# Patient Record
Sex: Female | Born: 1950 | Race: Black or African American | Hispanic: No | Smoking: Never smoker
Health system: Southern US, Community
[De-identification: ages and names within clinical notes are randomized; demographics above are authoritative.]

## PROBLEM LIST (undated history)

## (undated) DIAGNOSIS — F319 Bipolar disorder, unspecified: Secondary | ICD-10-CM

## (undated) DIAGNOSIS — E119 Type 2 diabetes mellitus without complications: Secondary | ICD-10-CM

## (undated) DIAGNOSIS — F419 Anxiety disorder, unspecified: Secondary | ICD-10-CM

## (undated) DIAGNOSIS — K219 Gastro-esophageal reflux disease without esophagitis: Secondary | ICD-10-CM

## (undated) DIAGNOSIS — I10 Essential (primary) hypertension: Secondary | ICD-10-CM

## (undated) DIAGNOSIS — K635 Polyp of colon: Secondary | ICD-10-CM

## (undated) DIAGNOSIS — G8929 Other chronic pain: Secondary | ICD-10-CM

## (undated) DIAGNOSIS — I480 Paroxysmal atrial fibrillation: Secondary | ICD-10-CM

## (undated) DIAGNOSIS — K5790 Diverticulosis of intestine, part unspecified, without perforation or abscess without bleeding: Secondary | ICD-10-CM

## (undated) DIAGNOSIS — M797 Fibromyalgia: Secondary | ICD-10-CM

## (undated) DIAGNOSIS — Z794 Long term (current) use of insulin: Secondary | ICD-10-CM

## (undated) DIAGNOSIS — F329 Major depressive disorder, single episode, unspecified: Secondary | ICD-10-CM

## (undated) DIAGNOSIS — K8020 Calculus of gallbladder without cholecystitis without obstruction: Secondary | ICD-10-CM

## (undated) DIAGNOSIS — I4819 Other persistent atrial fibrillation: Secondary | ICD-10-CM

## (undated) DIAGNOSIS — IMO0001 Reserved for inherently not codable concepts without codable children: Secondary | ICD-10-CM

## (undated) DIAGNOSIS — N183 Chronic kidney disease, stage 3 unspecified: Secondary | ICD-10-CM

## (undated) DIAGNOSIS — Z8719 Personal history of other diseases of the digestive system: Secondary | ICD-10-CM

## (undated) DIAGNOSIS — E785 Hyperlipidemia, unspecified: Secondary | ICD-10-CM

## (undated) DIAGNOSIS — K589 Irritable bowel syndrome without diarrhea: Secondary | ICD-10-CM

## (undated) DIAGNOSIS — R55 Syncope and collapse: Secondary | ICD-10-CM

## (undated) DIAGNOSIS — D860 Sarcoidosis of lung: Secondary | ICD-10-CM

## (undated) DIAGNOSIS — G40909 Epilepsy, unspecified, not intractable, without status epilepticus: Secondary | ICD-10-CM

## (undated) DIAGNOSIS — F32A Depression, unspecified: Secondary | ICD-10-CM

## (undated) DIAGNOSIS — M549 Dorsalgia, unspecified: Secondary | ICD-10-CM

## (undated) DIAGNOSIS — M1990 Unspecified osteoarthritis, unspecified site: Secondary | ICD-10-CM

## (undated) DIAGNOSIS — G629 Polyneuropathy, unspecified: Secondary | ICD-10-CM

## (undated) DIAGNOSIS — I4891 Unspecified atrial fibrillation: Secondary | ICD-10-CM

## (undated) DIAGNOSIS — R569 Unspecified convulsions: Secondary | ICD-10-CM

## (undated) HISTORY — DX: Irritable bowel syndrome, unspecified: K58.9

## (undated) HISTORY — PX: COLON SURGERY: SHX602

## (undated) HISTORY — DX: Chronic kidney disease, stage 3 (moderate): N18.3

## (undated) HISTORY — DX: Long term (current) use of insulin: Z79.4

## (undated) HISTORY — DX: Unspecified atrial fibrillation: I48.91

## (undated) HISTORY — DX: Major depressive disorder, single episode, unspecified: F32.9

## (undated) HISTORY — DX: Diverticulosis of intestine, part unspecified, without perforation or abscess without bleeding: K57.90

## (undated) HISTORY — DX: Depression, unspecified: F32.A

## (undated) HISTORY — PX: DILATION AND CURETTAGE OF UTERUS: SHX78

## (undated) HISTORY — DX: Polyneuropathy, unspecified: G62.9

## (undated) HISTORY — DX: Type 2 diabetes mellitus without complications: E11.9

## (undated) HISTORY — DX: Other persistent atrial fibrillation: I48.19

## (undated) HISTORY — DX: Reserved for inherently not codable concepts without codable children: IMO0001

## (undated) HISTORY — DX: Fibromyalgia: M79.7

## (undated) HISTORY — DX: Polyp of colon: K63.5

## (undated) HISTORY — DX: Calculus of gallbladder without cholecystitis without obstruction: K80.20

## (undated) HISTORY — DX: Paroxysmal atrial fibrillation: I48.0

## (undated) HISTORY — DX: Hyperlipidemia, unspecified: E78.5

## (undated) HISTORY — PX: FRACTURE SURGERY: SHX138

---

## 1974-08-17 HISTORY — PX: CHOLECYSTECTOMY OPEN: SUR202

## 1980-08-17 HISTORY — PX: TUBAL LIGATION: SHX77

## 1993-08-17 HISTORY — PX: ABDOMINAL HYSTERECTOMY: SHX81

## 1998-03-27 ENCOUNTER — Emergency Department (HOSPITAL_COMMUNITY): Admission: EM | Admit: 1998-03-27 | Discharge: 1998-03-27 | Payer: Self-pay | Admitting: Emergency Medicine

## 1998-08-17 HISTORY — PX: SALPINGOOPHORECTOMY: SHX82

## 1998-09-12 ENCOUNTER — Other Ambulatory Visit: Admission: RE | Admit: 1998-09-12 | Discharge: 1998-09-12 | Payer: Self-pay | Admitting: Internal Medicine

## 1998-11-19 ENCOUNTER — Other Ambulatory Visit: Admission: RE | Admit: 1998-11-19 | Discharge: 1998-11-19 | Payer: Self-pay | Admitting: Internal Medicine

## 1999-04-04 ENCOUNTER — Emergency Department (HOSPITAL_COMMUNITY): Admission: EM | Admit: 1999-04-04 | Discharge: 1999-04-04 | Payer: Self-pay | Admitting: Emergency Medicine

## 1999-04-05 ENCOUNTER — Encounter: Payer: Self-pay | Admitting: Emergency Medicine

## 1999-05-02 ENCOUNTER — Emergency Department (HOSPITAL_COMMUNITY): Admission: EM | Admit: 1999-05-02 | Discharge: 1999-05-02 | Payer: Self-pay | Admitting: Emergency Medicine

## 1999-05-20 ENCOUNTER — Ambulatory Visit (HOSPITAL_COMMUNITY): Admission: RE | Admit: 1999-05-20 | Discharge: 1999-05-20 | Payer: Self-pay | Admitting: Internal Medicine

## 1999-05-20 ENCOUNTER — Encounter: Payer: Self-pay | Admitting: Internal Medicine

## 1999-06-15 ENCOUNTER — Encounter: Payer: Self-pay | Admitting: Family Medicine

## 1999-06-15 ENCOUNTER — Ambulatory Visit (HOSPITAL_COMMUNITY): Admission: RE | Admit: 1999-06-15 | Discharge: 1999-06-15 | Payer: Self-pay | Admitting: Family Medicine

## 1999-07-07 ENCOUNTER — Encounter: Admission: RE | Admit: 1999-07-07 | Discharge: 1999-08-14 | Payer: Self-pay | Admitting: Orthopedic Surgery

## 1999-08-31 ENCOUNTER — Emergency Department (HOSPITAL_COMMUNITY): Admission: EM | Admit: 1999-08-31 | Discharge: 1999-08-31 | Payer: Self-pay | Admitting: Emergency Medicine

## 1999-11-01 ENCOUNTER — Emergency Department (HOSPITAL_COMMUNITY): Admission: EM | Admit: 1999-11-01 | Discharge: 1999-11-01 | Payer: Self-pay | Admitting: Emergency Medicine

## 1999-11-02 ENCOUNTER — Encounter: Payer: Self-pay | Admitting: Emergency Medicine

## 1999-11-25 ENCOUNTER — Encounter: Payer: Self-pay | Admitting: Emergency Medicine

## 1999-11-25 ENCOUNTER — Emergency Department (HOSPITAL_COMMUNITY): Admission: EM | Admit: 1999-11-25 | Discharge: 1999-11-25 | Payer: Self-pay | Admitting: Emergency Medicine

## 2000-01-10 ENCOUNTER — Emergency Department (HOSPITAL_COMMUNITY): Admission: EM | Admit: 2000-01-10 | Discharge: 2000-01-10 | Payer: Self-pay | Admitting: *Deleted

## 2000-01-10 ENCOUNTER — Emergency Department (HOSPITAL_COMMUNITY): Admission: EM | Admit: 2000-01-10 | Discharge: 2000-01-10 | Payer: Self-pay | Admitting: Emergency Medicine

## 2000-03-16 ENCOUNTER — Encounter: Admission: RE | Admit: 2000-03-16 | Discharge: 2000-06-14 | Payer: Self-pay | Admitting: Orthopedic Surgery

## 2000-06-15 ENCOUNTER — Encounter: Admission: RE | Admit: 2000-06-15 | Discharge: 2000-08-12 | Payer: Self-pay | Admitting: Orthopedic Surgery

## 2000-07-24 ENCOUNTER — Ambulatory Visit (HOSPITAL_COMMUNITY): Admission: RE | Admit: 2000-07-24 | Discharge: 2000-07-24 | Payer: Self-pay | Admitting: Orthopedic Surgery

## 2000-07-24 ENCOUNTER — Encounter: Payer: Self-pay | Admitting: Orthopedic Surgery

## 2000-08-17 HISTORY — PX: NEUROPLASTY / TRANSPOSITION MEDIAN NERVE AT CARPAL TUNNEL: SUR893

## 2000-11-17 ENCOUNTER — Ambulatory Visit (HOSPITAL_COMMUNITY)
Admission: RE | Admit: 2000-11-17 | Discharge: 2000-11-17 | Payer: Self-pay | Admitting: Physical Medicine and Rehabilitation

## 2000-11-17 ENCOUNTER — Encounter: Payer: Self-pay | Admitting: Physical Medicine and Rehabilitation

## 2000-12-22 ENCOUNTER — Ambulatory Visit (HOSPITAL_BASED_OUTPATIENT_CLINIC_OR_DEPARTMENT_OTHER): Admission: RE | Admit: 2000-12-22 | Discharge: 2000-12-22 | Payer: Self-pay | Admitting: Orthopedic Surgery

## 2001-07-26 ENCOUNTER — Encounter: Payer: Self-pay | Admitting: Family Medicine

## 2001-07-26 ENCOUNTER — Ambulatory Visit (HOSPITAL_COMMUNITY): Admission: RE | Admit: 2001-07-26 | Discharge: 2001-07-26 | Payer: Self-pay | Admitting: Family Medicine

## 2001-08-14 ENCOUNTER — Emergency Department (HOSPITAL_COMMUNITY): Admission: EM | Admit: 2001-08-14 | Discharge: 2001-08-14 | Payer: Self-pay | Admitting: Emergency Medicine

## 2001-10-11 ENCOUNTER — Ambulatory Visit (HOSPITAL_BASED_OUTPATIENT_CLINIC_OR_DEPARTMENT_OTHER): Admission: RE | Admit: 2001-10-11 | Discharge: 2001-10-11 | Payer: Self-pay | Admitting: Orthopedic Surgery

## 2002-01-15 HISTORY — PX: RESECTION OF HAND NEUROMA: SHX6071

## 2002-01-19 ENCOUNTER — Ambulatory Visit (HOSPITAL_BASED_OUTPATIENT_CLINIC_OR_DEPARTMENT_OTHER): Admission: RE | Admit: 2002-01-19 | Discharge: 2002-01-19 | Payer: Self-pay | Admitting: Orthopedic Surgery

## 2002-02-23 ENCOUNTER — Encounter: Payer: Self-pay | Admitting: Family Medicine

## 2002-02-23 ENCOUNTER — Ambulatory Visit (HOSPITAL_COMMUNITY): Admission: RE | Admit: 2002-02-23 | Discharge: 2002-02-23 | Payer: Self-pay | Admitting: Family Medicine

## 2002-03-06 ENCOUNTER — Encounter: Payer: Self-pay | Admitting: Family Medicine

## 2002-03-06 ENCOUNTER — Ambulatory Visit (HOSPITAL_COMMUNITY): Admission: RE | Admit: 2002-03-06 | Discharge: 2002-03-06 | Payer: Self-pay | Admitting: Family Medicine

## 2002-03-16 ENCOUNTER — Encounter: Admission: RE | Admit: 2002-03-16 | Discharge: 2002-03-16 | Payer: Self-pay | Admitting: Obstetrics and Gynecology

## 2002-04-11 ENCOUNTER — Encounter (INDEPENDENT_AMBULATORY_CARE_PROVIDER_SITE_OTHER): Payer: Self-pay | Admitting: Specialist

## 2002-04-11 ENCOUNTER — Inpatient Hospital Stay (HOSPITAL_COMMUNITY): Admission: RE | Admit: 2002-04-11 | Discharge: 2002-04-14 | Payer: Self-pay | Admitting: Obstetrics and Gynecology

## 2002-04-11 ENCOUNTER — Encounter (INDEPENDENT_AMBULATORY_CARE_PROVIDER_SITE_OTHER): Payer: Self-pay

## 2002-04-18 ENCOUNTER — Encounter: Admission: RE | Admit: 2002-04-18 | Discharge: 2002-04-18 | Payer: Self-pay | Admitting: *Deleted

## 2002-05-02 ENCOUNTER — Encounter: Admission: RE | Admit: 2002-05-02 | Discharge: 2002-05-02 | Payer: Self-pay | Admitting: *Deleted

## 2002-05-16 ENCOUNTER — Encounter: Admission: RE | Admit: 2002-05-16 | Discharge: 2002-05-16 | Payer: Self-pay | Admitting: *Deleted

## 2002-05-22 ENCOUNTER — Encounter: Payer: Self-pay | Admitting: Obstetrics and Gynecology

## 2002-05-22 ENCOUNTER — Inpatient Hospital Stay (HOSPITAL_COMMUNITY): Admission: AD | Admit: 2002-05-22 | Discharge: 2002-05-22 | Payer: Self-pay | Admitting: Obstetrics and Gynecology

## 2002-05-30 ENCOUNTER — Encounter: Admission: RE | Admit: 2002-05-30 | Discharge: 2002-05-30 | Payer: Self-pay | Admitting: *Deleted

## 2002-06-07 ENCOUNTER — Ambulatory Visit (HOSPITAL_COMMUNITY): Admission: RE | Admit: 2002-06-07 | Discharge: 2002-06-07 | Payer: Self-pay | Admitting: *Deleted

## 2002-07-14 ENCOUNTER — Encounter: Payer: Self-pay | Admitting: Emergency Medicine

## 2002-07-14 ENCOUNTER — Emergency Department (HOSPITAL_COMMUNITY): Admission: EM | Admit: 2002-07-14 | Discharge: 2002-07-14 | Payer: Self-pay | Admitting: Emergency Medicine

## 2002-08-17 HISTORY — PX: NEUROPLASTY / TRANSPOSITION MEDIAN NERVE AT CARPAL TUNNEL: SUR893

## 2002-08-22 ENCOUNTER — Ambulatory Visit (HOSPITAL_COMMUNITY): Admission: RE | Admit: 2002-08-22 | Discharge: 2002-08-22 | Payer: Self-pay | Admitting: Internal Medicine

## 2002-11-01 ENCOUNTER — Ambulatory Visit (HOSPITAL_COMMUNITY): Admission: RE | Admit: 2002-11-01 | Discharge: 2002-11-01 | Payer: Self-pay | Admitting: Family Medicine

## 2002-11-01 ENCOUNTER — Encounter: Payer: Self-pay | Admitting: Family Medicine

## 2003-01-11 ENCOUNTER — Ambulatory Visit (HOSPITAL_COMMUNITY): Admission: RE | Admit: 2003-01-11 | Discharge: 2003-01-11 | Payer: Self-pay | Admitting: Family Medicine

## 2003-01-31 ENCOUNTER — Encounter: Payer: Self-pay | Admitting: Family Medicine

## 2003-01-31 ENCOUNTER — Ambulatory Visit (HOSPITAL_COMMUNITY): Admission: RE | Admit: 2003-01-31 | Discharge: 2003-01-31 | Payer: Self-pay | Admitting: Family Medicine

## 2003-02-27 ENCOUNTER — Encounter: Admission: RE | Admit: 2003-02-27 | Discharge: 2003-05-28 | Payer: Self-pay | Admitting: Family Medicine

## 2003-03-15 ENCOUNTER — Encounter: Admission: RE | Admit: 2003-03-15 | Discharge: 2003-03-15 | Payer: Self-pay | Admitting: Obstetrics and Gynecology

## 2003-04-03 ENCOUNTER — Encounter: Payer: Self-pay | Admitting: Emergency Medicine

## 2003-04-03 ENCOUNTER — Emergency Department (HOSPITAL_COMMUNITY): Admission: EM | Admit: 2003-04-03 | Discharge: 2003-04-03 | Payer: Self-pay | Admitting: Emergency Medicine

## 2003-04-12 ENCOUNTER — Ambulatory Visit (HOSPITAL_COMMUNITY): Admission: RE | Admit: 2003-04-12 | Discharge: 2003-04-12 | Payer: Self-pay | Admitting: Gastroenterology

## 2003-04-12 ENCOUNTER — Encounter: Payer: Self-pay | Admitting: Gastroenterology

## 2003-05-29 ENCOUNTER — Encounter: Admission: RE | Admit: 2003-05-29 | Discharge: 2003-07-31 | Payer: Self-pay | Admitting: Family Medicine

## 2003-06-11 ENCOUNTER — Ambulatory Visit (HOSPITAL_COMMUNITY): Admission: RE | Admit: 2003-06-11 | Discharge: 2003-06-11 | Payer: Self-pay | Admitting: Family Medicine

## 2003-06-13 ENCOUNTER — Encounter: Admission: RE | Admit: 2003-06-13 | Discharge: 2003-06-13 | Payer: Self-pay | Admitting: Family Medicine

## 2004-03-06 ENCOUNTER — Emergency Department (HOSPITAL_COMMUNITY): Admission: EM | Admit: 2004-03-06 | Discharge: 2004-03-06 | Payer: Self-pay | Admitting: Emergency Medicine

## 2004-06-27 ENCOUNTER — Ambulatory Visit (HOSPITAL_COMMUNITY): Admission: RE | Admit: 2004-06-27 | Discharge: 2004-06-27 | Payer: Self-pay | Admitting: Family Medicine

## 2004-07-08 ENCOUNTER — Encounter: Admission: RE | Admit: 2004-07-08 | Discharge: 2004-07-08 | Payer: Self-pay | Admitting: Family Medicine

## 2004-07-10 ENCOUNTER — Emergency Department (HOSPITAL_COMMUNITY): Admission: EM | Admit: 2004-07-10 | Discharge: 2004-07-10 | Payer: Self-pay | Admitting: Emergency Medicine

## 2004-07-12 ENCOUNTER — Emergency Department (HOSPITAL_COMMUNITY): Admission: EM | Admit: 2004-07-12 | Discharge: 2004-07-12 | Payer: Self-pay | Admitting: Emergency Medicine

## 2004-10-27 ENCOUNTER — Emergency Department (HOSPITAL_COMMUNITY): Admission: EM | Admit: 2004-10-27 | Discharge: 2004-10-27 | Payer: Self-pay | Admitting: Emergency Medicine

## 2004-11-04 ENCOUNTER — Emergency Department (HOSPITAL_COMMUNITY): Admission: EM | Admit: 2004-11-04 | Discharge: 2004-11-04 | Payer: Self-pay | Admitting: Emergency Medicine

## 2005-01-13 ENCOUNTER — Emergency Department (HOSPITAL_COMMUNITY): Admission: EM | Admit: 2005-01-13 | Discharge: 2005-01-13 | Payer: Self-pay | Admitting: Emergency Medicine

## 2006-05-19 ENCOUNTER — Ambulatory Visit (HOSPITAL_COMMUNITY): Admission: RE | Admit: 2006-05-19 | Discharge: 2006-05-19 | Payer: Self-pay | Admitting: Obstetrics and Gynecology

## 2006-05-28 ENCOUNTER — Ambulatory Visit: Payer: Self-pay | Admitting: Gastroenterology

## 2006-06-09 ENCOUNTER — Ambulatory Visit: Payer: Self-pay | Admitting: Obstetrics and Gynecology

## 2006-06-10 ENCOUNTER — Ambulatory Visit: Payer: Self-pay | Admitting: Gastroenterology

## 2006-10-16 HISTORY — PX: CLOSED REDUCTION ANKLE FRACTURE: SUR210

## 2006-10-20 ENCOUNTER — Emergency Department (HOSPITAL_COMMUNITY): Admission: EM | Admit: 2006-10-20 | Discharge: 2006-10-21 | Payer: Self-pay | Admitting: Emergency Medicine

## 2006-10-21 ENCOUNTER — Emergency Department (HOSPITAL_COMMUNITY): Admission: EM | Admit: 2006-10-21 | Discharge: 2006-10-21 | Payer: Self-pay | Admitting: Emergency Medicine

## 2006-10-27 ENCOUNTER — Ambulatory Visit (HOSPITAL_BASED_OUTPATIENT_CLINIC_OR_DEPARTMENT_OTHER): Admission: RE | Admit: 2006-10-27 | Discharge: 2006-10-28 | Payer: Self-pay | Admitting: Orthopedic Surgery

## 2007-05-23 ENCOUNTER — Ambulatory Visit (HOSPITAL_COMMUNITY): Admission: RE | Admit: 2007-05-23 | Discharge: 2007-05-23 | Payer: Self-pay | Admitting: Obstetrics and Gynecology

## 2007-09-26 ENCOUNTER — Emergency Department (HOSPITAL_COMMUNITY): Admission: EM | Admit: 2007-09-26 | Discharge: 2007-09-26 | Payer: Self-pay | Admitting: Emergency Medicine

## 2007-09-28 ENCOUNTER — Observation Stay (HOSPITAL_COMMUNITY): Admission: EM | Admit: 2007-09-28 | Discharge: 2007-09-29 | Payer: Self-pay | Admitting: Emergency Medicine

## 2007-09-29 ENCOUNTER — Encounter (INDEPENDENT_AMBULATORY_CARE_PROVIDER_SITE_OTHER): Payer: Self-pay | Admitting: Internal Medicine

## 2007-12-07 ENCOUNTER — Ambulatory Visit: Payer: Self-pay | Admitting: Family Medicine

## 2007-12-08 ENCOUNTER — Encounter: Admission: RE | Admit: 2007-12-08 | Discharge: 2008-03-07 | Payer: Self-pay | Admitting: Internal Medicine

## 2007-12-09 ENCOUNTER — Ambulatory Visit: Payer: Self-pay | Admitting: *Deleted

## 2008-01-31 ENCOUNTER — Ambulatory Visit: Payer: Self-pay | Admitting: Internal Medicine

## 2008-03-01 ENCOUNTER — Encounter: Payer: Self-pay | Admitting: Family Medicine

## 2008-03-01 ENCOUNTER — Ambulatory Visit: Payer: Self-pay | Admitting: Internal Medicine

## 2008-03-01 ENCOUNTER — Emergency Department (HOSPITAL_COMMUNITY): Admission: EM | Admit: 2008-03-01 | Discharge: 2008-03-01 | Payer: Self-pay | Admitting: Emergency Medicine

## 2008-03-01 LAB — CONVERTED CEMR LAB
Alkaline Phosphatase: 77 units/L (ref 39–117)
BUN: 19 mg/dL (ref 6–23)
CO2: 21 meq/L (ref 19–32)
Cholesterol: 210 mg/dL — ABNORMAL HIGH (ref 0–200)
Creatinine, Ser: 1.24 mg/dL — ABNORMAL HIGH (ref 0.40–1.20)
Eosinophils Absolute: 0.1 10*3/uL (ref 0.0–0.7)
Eosinophils Relative: 2 % (ref 0–5)
Glucose, Bld: 131 mg/dL — ABNORMAL HIGH (ref 70–99)
HCT: 40.3 % (ref 36.0–46.0)
HDL: 44 mg/dL (ref >39)
Hemoglobin: 12.9 g/dL (ref 12.0–15.0)
LDL Cholesterol: 93 mg/dL (ref 0–99)
Lymphs Abs: 1.5 10*3/uL (ref 0.7–4.0)
MCV: 89.8 fL (ref 78.0–100.0)
Monocytes Absolute: 0.3 10*3/uL (ref 0.1–1.0)
Platelets: 199 10*3/uL (ref 150–400)
Sodium: 138 meq/L (ref 135–145)
Total Bilirubin: 0.5 mg/dL (ref 0.3–1.2)
Total CHOL/HDL Ratio: 4.8
Total Protein: 8.6 g/dL — ABNORMAL HIGH (ref 6.0–8.3)
Triglycerides: 366 mg/dL — ABNORMAL HIGH (ref <150)
VLDL: 73 mg/dL — ABNORMAL HIGH (ref 0–40)
WBC: 4 10*3/uL (ref 4.0–10.5)

## 2008-03-07 ENCOUNTER — Ambulatory Visit: Payer: Self-pay | Admitting: Internal Medicine

## 2008-03-08 ENCOUNTER — Encounter: Admission: RE | Admit: 2008-03-08 | Discharge: 2008-05-08 | Payer: Self-pay | Admitting: Internal Medicine

## 2008-04-18 ENCOUNTER — Ambulatory Visit: Payer: Self-pay | Admitting: Internal Medicine

## 2008-04-18 ENCOUNTER — Encounter: Payer: Self-pay | Admitting: Family Medicine

## 2008-04-18 LAB — CONVERTED CEMR LAB
Free T4: 1.26 ng/dL (ref 0.89–1.80)
TSH: 1.084 microintl units/mL (ref 0.350–4.50)
Total CHOL/HDL Ratio: 4

## 2008-05-03 ENCOUNTER — Ambulatory Visit: Payer: Self-pay | Admitting: Internal Medicine

## 2008-05-04 ENCOUNTER — Ambulatory Visit: Payer: Self-pay | Admitting: Family Medicine

## 2008-05-29 ENCOUNTER — Ambulatory Visit (HOSPITAL_COMMUNITY): Admission: RE | Admit: 2008-05-29 | Discharge: 2008-05-29 | Payer: Self-pay | Admitting: Family Medicine

## 2008-05-30 ENCOUNTER — Encounter: Admission: RE | Admit: 2008-05-30 | Discharge: 2008-05-30 | Payer: Self-pay | Admitting: Internal Medicine

## 2008-05-31 ENCOUNTER — Ambulatory Visit: Payer: Self-pay | Admitting: Internal Medicine

## 2008-05-31 ENCOUNTER — Encounter: Payer: Self-pay | Admitting: Family Medicine

## 2008-05-31 LAB — CONVERTED CEMR LAB
ALT: 15 units/L (ref 0–35)
Alkaline Phosphatase: 59 units/L (ref 39–117)
CO2: 24 meq/L (ref 19–32)
Creatinine, Ser: 1.18 mg/dL (ref 0.40–1.20)
Sodium: 139 meq/L (ref 135–145)
Total Bilirubin: 0.2 mg/dL — ABNORMAL LOW (ref 0.3–1.2)
Total CK: 227 units/L — ABNORMAL HIGH (ref 7–177)
Total Protein: 7.7 g/dL (ref 6.0–8.3)

## 2008-06-21 ENCOUNTER — Ambulatory Visit: Payer: Self-pay | Admitting: Internal Medicine

## 2008-06-21 ENCOUNTER — Encounter: Payer: Self-pay | Admitting: Family Medicine

## 2008-06-21 LAB — CONVERTED CEMR LAB
Alkaline Phosphatase: 62 units/L (ref 39–117)
Basophils Absolute: 0 10*3/uL (ref 0.0–0.1)
CO2: 24 meq/L (ref 19–32)
Cholesterol: 211 mg/dL — ABNORMAL HIGH (ref 0–200)
Creatinine, Ser: 1.38 mg/dL — ABNORMAL HIGH (ref 0.40–1.20)
Eosinophils Absolute: 0 10*3/uL (ref 0.0–0.7)
Eosinophils Relative: 1 % (ref 0–5)
Glucose, Bld: 150 mg/dL — ABNORMAL HIGH (ref 70–99)
HCT: 37.3 % (ref 36.0–46.0)
HDL: 47 mg/dL (ref >39)
Hemoglobin: 12 g/dL (ref 12.0–15.0)
LDL Cholesterol: 87 mg/dL (ref 0–99)
Lymphocytes Relative: 46 % (ref 12–46)
Lymphs Abs: 1.5 10*3/uL (ref 0.7–4.0)
MCV: 89.2 fL (ref 78.0–100.0)
Monocytes Absolute: 0.2 10*3/uL (ref 0.1–1.0)
Platelets: 184 10*3/uL (ref 150–400)
RDW: 14.1 % (ref 11.5–15.5)
Total Bilirubin: 0.6 mg/dL (ref 0.3–1.2)
Total CHOL/HDL Ratio: 4.5
Triglycerides: 387 mg/dL — ABNORMAL HIGH (ref <150)
VLDL: 77 mg/dL — ABNORMAL HIGH (ref 0–40)

## 2008-07-09 ENCOUNTER — Emergency Department (HOSPITAL_COMMUNITY): Admission: EM | Admit: 2008-07-09 | Discharge: 2008-07-09 | Payer: Self-pay | Admitting: Emergency Medicine

## 2008-07-10 ENCOUNTER — Ambulatory Visit: Payer: Self-pay | Admitting: Family Medicine

## 2008-07-25 ENCOUNTER — Ambulatory Visit: Payer: Self-pay | Admitting: Family Medicine

## 2008-07-25 ENCOUNTER — Encounter: Payer: Self-pay | Admitting: Family Medicine

## 2008-08-29 ENCOUNTER — Encounter: Admission: RE | Admit: 2008-08-29 | Discharge: 2008-08-29 | Payer: Self-pay | Admitting: Internal Medicine

## 2008-10-09 ENCOUNTER — Ambulatory Visit: Payer: Self-pay | Admitting: Internal Medicine

## 2008-10-09 ENCOUNTER — Encounter: Payer: Self-pay | Admitting: Family Medicine

## 2008-10-09 LAB — CONVERTED CEMR LAB
ALT: 19 units/L (ref 0–35)
AST: 25 units/L (ref 0–37)
Alkaline Phosphatase: 64 units/L (ref 39–117)
BUN: 20 mg/dL (ref 6–23)
Calcium: 10 mg/dL (ref 8.4–10.5)
Creatinine, Ser: 1.38 mg/dL — ABNORMAL HIGH (ref 0.40–1.20)
HDL: 57 mg/dL (ref >39)
LDL Cholesterol: 162 mg/dL — ABNORMAL HIGH (ref 0–99)
Total Bilirubin: 0.4 mg/dL (ref 0.3–1.2)
Total CHOL/HDL Ratio: 4.6
VLDL: 42 mg/dL — ABNORMAL HIGH (ref 0–40)

## 2008-10-24 ENCOUNTER — Ambulatory Visit: Payer: Self-pay | Admitting: Family Medicine

## 2008-10-24 LAB — CONVERTED CEMR LAB: Microalb, Ur: 0.81 mg/dL (ref 0.00–1.89)

## 2008-11-19 ENCOUNTER — Encounter: Payer: Self-pay | Admitting: Internal Medicine

## 2008-11-19 ENCOUNTER — Ambulatory Visit: Payer: Self-pay | Admitting: Internal Medicine

## 2008-11-19 DIAGNOSIS — E118 Type 2 diabetes mellitus with unspecified complications: Secondary | ICD-10-CM

## 2008-11-19 DIAGNOSIS — E1165 Type 2 diabetes mellitus with hyperglycemia: Secondary | ICD-10-CM

## 2008-11-19 DIAGNOSIS — R05 Cough: Secondary | ICD-10-CM

## 2008-11-19 DIAGNOSIS — I10 Essential (primary) hypertension: Secondary | ICD-10-CM

## 2008-11-19 DIAGNOSIS — E785 Hyperlipidemia, unspecified: Secondary | ICD-10-CM

## 2008-12-14 ENCOUNTER — Ambulatory Visit: Payer: Self-pay | Admitting: Internal Medicine

## 2009-02-05 ENCOUNTER — Ambulatory Visit: Payer: Self-pay | Admitting: Family Medicine

## 2009-02-24 ENCOUNTER — Emergency Department (HOSPITAL_COMMUNITY): Admission: EM | Admit: 2009-02-24 | Discharge: 2009-02-24 | Payer: Self-pay | Admitting: Emergency Medicine

## 2009-03-06 ENCOUNTER — Ambulatory Visit: Payer: Self-pay | Admitting: Internal Medicine

## 2009-03-15 ENCOUNTER — Ambulatory Visit: Payer: Self-pay | Admitting: Internal Medicine

## 2009-05-08 ENCOUNTER — Ambulatory Visit: Payer: Self-pay | Admitting: Internal Medicine

## 2009-05-08 ENCOUNTER — Encounter: Payer: Self-pay | Admitting: Family Medicine

## 2009-05-08 LAB — CONVERTED CEMR LAB
Albumin: 4.7 g/dL (ref 3.5–5.2)
Alkaline Phosphatase: 65 units/L (ref 39–117)
BUN: 15 mg/dL (ref 6–23)
Creatinine, Ser: 1.3 mg/dL — ABNORMAL HIGH (ref 0.40–1.20)
Glucose, Bld: 124 mg/dL — ABNORMAL HIGH (ref 70–99)
HDL: 48 mg/dL (ref >39)
LDL Cholesterol: 48 mg/dL (ref 0–99)
Potassium: 3.8 meq/L (ref 3.5–5.3)
Total CHOL/HDL Ratio: 2.5
Triglycerides: 121 mg/dL (ref <150)

## 2009-05-16 ENCOUNTER — Ambulatory Visit: Payer: Self-pay | Admitting: Family Medicine

## 2009-05-30 ENCOUNTER — Ambulatory Visit (HOSPITAL_COMMUNITY): Admission: RE | Admit: 2009-05-30 | Discharge: 2009-05-30 | Payer: Self-pay | Admitting: Family Medicine

## 2009-06-28 ENCOUNTER — Ambulatory Visit: Payer: Self-pay | Admitting: Internal Medicine

## 2009-07-15 ENCOUNTER — Ambulatory Visit: Payer: Self-pay | Admitting: Internal Medicine

## 2009-09-09 ENCOUNTER — Ambulatory Visit: Payer: Self-pay | Admitting: Internal Medicine

## 2009-11-08 ENCOUNTER — Ambulatory Visit: Payer: Self-pay | Admitting: Internal Medicine

## 2009-11-20 ENCOUNTER — Ambulatory Visit: Payer: Self-pay | Admitting: Internal Medicine

## 2009-11-20 LAB — CONVERTED CEMR LAB
AST: 17 units/L (ref 0–37)
BUN: 20 mg/dL (ref 6–23)
CO2: 23 meq/L (ref 19–32)
Calcium: 9.7 mg/dL (ref 8.4–10.5)
Chloride: 102 meq/L (ref 96–112)
Cholesterol: 131 mg/dL (ref 0–200)
Creatinine, Ser: 1.23 mg/dL — ABNORMAL HIGH (ref 0.40–1.20)
HDL: 45 mg/dL (ref >39)
Total CHOL/HDL Ratio: 2.9

## 2009-12-09 ENCOUNTER — Ambulatory Visit: Payer: Self-pay | Admitting: Internal Medicine

## 2010-02-07 ENCOUNTER — Encounter (INDEPENDENT_AMBULATORY_CARE_PROVIDER_SITE_OTHER): Payer: Self-pay | Admitting: Internal Medicine

## 2010-02-07 ENCOUNTER — Ambulatory Visit: Payer: Self-pay | Admitting: Internal Medicine

## 2010-02-07 LAB — CONVERTED CEMR LAB
CRP: 0.3 mg/dL (ref <0.6)
Eosinophils Relative: 2 % (ref 0–5)
FSH: 46 milliintl units/mL
HCT: 35.9 % — ABNORMAL LOW (ref 36.0–46.0)
Hemoglobin: 11.8 g/dL — ABNORMAL LOW (ref 12.0–15.0)
Lymphocytes Relative: 38 % (ref 12–46)
Lymphs Abs: 1.4 10*3/uL (ref 0.7–4.0)
Monocytes Absolute: 0.3 10*3/uL (ref 0.1–1.0)
RBC: 4.11 M/uL (ref 3.87–5.11)
Saturation Ratios: 19 % — ABNORMAL LOW (ref 20–55)
TSH: 1.034 microintl units/mL (ref 0.350–4.500)
Total CK: 171 units/L (ref 7–177)
WBC: 3.7 10*3/uL — ABNORMAL LOW (ref 4.0–10.5)

## 2010-03-05 ENCOUNTER — Ambulatory Visit: Payer: Self-pay | Admitting: Internal Medicine

## 2010-03-11 ENCOUNTER — Ambulatory Visit: Payer: Self-pay | Admitting: Internal Medicine

## 2010-04-07 ENCOUNTER — Ambulatory Visit: Payer: Self-pay | Admitting: Internal Medicine

## 2010-05-02 ENCOUNTER — Emergency Department (HOSPITAL_COMMUNITY): Admission: EM | Admit: 2010-05-02 | Discharge: 2010-05-02 | Payer: Self-pay | Admitting: Family Medicine

## 2010-05-09 ENCOUNTER — Emergency Department (HOSPITAL_COMMUNITY): Admission: EM | Admit: 2010-05-09 | Discharge: 2010-05-09 | Payer: Self-pay | Admitting: Emergency Medicine

## 2010-05-23 ENCOUNTER — Encounter (INDEPENDENT_AMBULATORY_CARE_PROVIDER_SITE_OTHER): Payer: Self-pay | Admitting: Family Medicine

## 2010-05-23 LAB — CONVERTED CEMR LAB
BUN: 14 mg/dL (ref 6–23)
Glucose, Bld: 143 mg/dL — ABNORMAL HIGH (ref 70–99)
Potassium: 4.3 meq/L (ref 3.5–5.3)

## 2010-06-02 ENCOUNTER — Ambulatory Visit (HOSPITAL_COMMUNITY): Admission: RE | Admit: 2010-06-02 | Discharge: 2010-06-02 | Payer: Self-pay | Admitting: Internal Medicine

## 2010-08-17 HISTORY — PX: CARDIAC CATHETERIZATION: SHX172

## 2010-09-05 ENCOUNTER — Inpatient Hospital Stay (HOSPITAL_COMMUNITY)
Admission: EM | Admit: 2010-09-05 | Discharge: 2010-09-11 | Payer: Self-pay | Source: Home / Self Care | Attending: Internal Medicine | Admitting: Internal Medicine

## 2010-09-06 ENCOUNTER — Encounter: Payer: Self-pay | Admitting: Family Medicine

## 2010-09-08 LAB — DIFFERENTIAL
Basophils Absolute: 0 10*3/uL (ref 0.0–0.1)
Basophils Relative: 0 % (ref 0–1)
Eosinophils Absolute: 0.1 10*3/uL (ref 0.0–0.7)
Eosinophils Relative: 1 % (ref 0–5)
Monocytes Absolute: 0.6 10*3/uL (ref 0.1–1.0)
Monocytes Relative: 5 % (ref 3–12)
Neutro Abs: 6.1 10*3/uL (ref 1.7–7.7)

## 2010-09-08 LAB — URINALYSIS, ROUTINE W REFLEX MICROSCOPIC
Bilirubin Urine: NEGATIVE
Hgb urine dipstick: NEGATIVE
Ketones, ur: NEGATIVE mg/dL
Leukocytes, UA: NEGATIVE
Nitrite: NEGATIVE
Protein, ur: 30 mg/dL — AB
Specific Gravity, Urine: 1.034 — ABNORMAL HIGH (ref 1.005–1.030)
Urine Glucose, Fasting: >1000 mg/dL — AB
Urobilinogen, UA: 0.2 mg/dL (ref 0.0–1.0)
pH: 5.5 (ref 5.0–8.0)

## 2010-09-08 LAB — CBC
HCT: 42 % (ref 36.0–46.0)
MCHC: 34.5 g/dL (ref 30.0–36.0)
WBC: 11 10*3/uL — ABNORMAL HIGH (ref 4.0–10.5)

## 2010-09-08 LAB — CARDIAC PANEL(CRET KIN+CKTOT+MB+TROPI)
CK, MB: 1.8 ng/mL (ref 0.3–4.0)
Total CK: 69 U/L (ref 7–177)
Troponin I: 0.03 ng/mL (ref 0.00–0.06)

## 2010-09-08 LAB — BASIC METABOLIC PANEL
GFR calc Af Amer: 42 mL/min — ABNORMAL LOW (ref >60)
GFR calc non Af Amer: 35 mL/min — ABNORMAL LOW (ref >60)
Glucose, Bld: 238 mg/dL — ABNORMAL HIGH (ref 70–99)
Sodium: 139 mEq/L (ref 135–145)

## 2010-09-08 LAB — GLUCOSE, CAPILLARY: Glucose-Capillary: 358 mg/dL — ABNORMAL HIGH (ref 70–99)

## 2010-09-08 LAB — URINE MICROSCOPIC-ADD ON

## 2010-09-08 LAB — POCT CARDIAC MARKERS
Myoglobin, poc: 72.9 ng/mL (ref 12–200)
Troponin i, poc: <0.05 ng/mL (ref 0.00–0.09)

## 2010-09-09 LAB — BASIC METABOLIC PANEL
BUN: 17 mg/dL (ref 6–23)
CO2: 23 mEq/L (ref 19–32)
Calcium: 8.7 mg/dL (ref 8.4–10.5)
Calcium: 8.5 mg/dL (ref 8.4–10.5)
Creatinine, Ser: 1.2 mg/dL (ref 0.4–1.2)
GFR calc Af Amer: >60 mL/min (ref >60)
GFR calc Af Amer: 56 mL/min — ABNORMAL LOW (ref >60)
GFR calc non Af Amer: 56 mL/min — ABNORMAL LOW (ref >60)
GFR calc non Af Amer: 46 mL/min — ABNORMAL LOW (ref >60)
Potassium: 4.5 mEq/L (ref 3.5–5.1)
Sodium: 136 mEq/L (ref 135–145)

## 2010-09-09 LAB — COMPREHENSIVE METABOLIC PANEL
AST: 52 U/L — ABNORMAL HIGH (ref 0–37)
Albumin: 3 g/dL — ABNORMAL LOW (ref 3.5–5.2)
Calcium: 8.9 mg/dL (ref 8.4–10.5)
Chloride: 107 mEq/L (ref 96–112)
Creatinine, Ser: 1.19 mg/dL (ref 0.4–1.2)
GFR calc Af Amer: 56 mL/min — ABNORMAL LOW (ref >60)

## 2010-09-09 LAB — GLUCOSE, CAPILLARY
Glucose-Capillary: 307 mg/dL — ABNORMAL HIGH (ref 70–99)
Glucose-Capillary: 180 mg/dL — ABNORMAL HIGH (ref 70–99)
Glucose-Capillary: 228 mg/dL — ABNORMAL HIGH (ref 70–99)
Glucose-Capillary: 256 mg/dL — ABNORMAL HIGH (ref 70–99)
Glucose-Capillary: 237 mg/dL — ABNORMAL HIGH (ref 70–99)

## 2010-09-09 LAB — CBC
HCT: 35.4 % — ABNORMAL LOW (ref 36.0–46.0)
Hemoglobin: 12.1 g/dL (ref 12.0–15.0)
MCH: 29.5 pg (ref 26.0–34.0)
MCH: 28.9 pg (ref 26.0–34.0)
MCHC: 34.2 g/dL (ref 30.0–36.0)
MCHC: 34 g/dL (ref 30.0–36.0)
MCHC: 33.4 g/dL (ref 30.0–36.0)
MCV: 85.9 fL (ref 78.0–100.0)
MCV: 86.7 fL (ref 78.0–100.0)
Platelets: 187 10*3/uL (ref 150–400)
Platelets: 150 10*3/uL (ref 150–400)
Platelets: 145 10*3/uL — ABNORMAL LOW (ref 150–400)
RBC: 4.36 MIL/uL (ref 3.87–5.11)
RDW: 13.9 % (ref 11.5–15.5)

## 2010-09-09 LAB — CARDIAC PANEL(CRET KIN+CKTOT+MB+TROPI)
CK, MB: 1.6 ng/mL (ref 0.3–4.0)
Relative Index: INVALID (ref 0.0–2.5)
Relative Index: INVALID (ref 0.0–2.5)
Total CK: 45 U/L (ref 7–177)
Total CK: 57 U/L (ref 7–177)
Total CK: 38 U/L (ref 7–177)
Troponin I: 0.02 ng/mL (ref 0.00–0.06)
Troponin I: 0.02 ng/mL (ref 0.00–0.06)

## 2010-09-09 LAB — DIFFERENTIAL
Basophils Relative: 0 % (ref 0–1)
Eosinophils Absolute: 0.1 10*3/uL (ref 0.0–0.7)
Monocytes Relative: 7 % (ref 3–12)
Neutrophils Relative %: 52 % (ref 43–77)

## 2010-09-09 LAB — LIPID PANEL
Cholesterol: 219 mg/dL — ABNORMAL HIGH (ref 0–200)
Cholesterol: 247 mg/dL — ABNORMAL HIGH (ref 0–200)
HDL: 50 mg/dL (ref >39)
Triglycerides: 759 mg/dL — ABNORMAL HIGH (ref <150)
Triglycerides: 427 mg/dL — ABNORMAL HIGH (ref <150)

## 2010-09-09 LAB — PROTIME-INR
INR: 1.01 (ref 0.00–1.49)
Prothrombin Time: 13.5 seconds (ref 11.6–15.2)

## 2010-09-09 LAB — TSH: TSH: 1.051 u[IU]/mL (ref 0.350–4.500)

## 2010-09-09 LAB — HEMOGLOBIN A1C: Hgb A1c MFr Bld: 8.3 % — ABNORMAL HIGH (ref <5.7)

## 2010-09-10 LAB — GLUCOSE, CAPILLARY
Glucose-Capillary: 293 mg/dL — ABNORMAL HIGH (ref 70–99)
Glucose-Capillary: 183 mg/dL — ABNORMAL HIGH (ref 70–99)
Glucose-Capillary: 265 mg/dL — ABNORMAL HIGH (ref 70–99)
Glucose-Capillary: 179 mg/dL — ABNORMAL HIGH (ref 70–99)
Glucose-Capillary: 175 mg/dL — ABNORMAL HIGH (ref 70–99)

## 2010-09-10 LAB — CBC
MCH: 28.6 pg (ref 26.0–34.0)
MCH: 29.3 pg (ref 26.0–34.0)
MCHC: 33.5 g/dL (ref 30.0–36.0)
MCV: 87.3 fL (ref 78.0–100.0)
MCV: 87.4 fL (ref 78.0–100.0)
Platelets: 162 10*3/uL (ref 150–400)
Platelets: 149 10*3/uL — ABNORMAL LOW (ref 150–400)
RDW: 14.3 % (ref 11.5–15.5)
RDW: 14.5 % (ref 11.5–15.5)
WBC: 4.5 10*3/uL (ref 4.0–10.5)

## 2010-09-10 LAB — HEMOGLOBIN A1C
Hgb A1c MFr Bld: 8.4 % — ABNORMAL HIGH (ref <5.7)
Mean Plasma Glucose: 194 mg/dL — ABNORMAL HIGH (ref <117)

## 2010-09-11 LAB — CBC
HCT: 37.3 % (ref 36.0–46.0)
MCH: 29.1 pg (ref 26.0–34.0)
MCV: 87.6 fL (ref 78.0–100.0)
Platelets: 162 10*3/uL (ref 150–400)
RDW: 14.8 % (ref 11.5–15.5)

## 2010-09-11 NOTE — Discharge Summary (Addendum)
[name], SALVADOR               ACCOUNT NO.:  [id number]  MEDICAL RECORD NO.:  [id number]          PATIENT TYPE:  INP  LOCATION:  1408                         FACILITY:  [facility]  PHYSICIAN:  Andreas Blower, MD       DATE OF BIRTH:  [birth year]  DATE OF ADMISSION:  09/05/2010 DATE OF DISCHARGE:                              DISCHARGE SUMMARY   PRIMARY CARE PHYSICIAN:  Dr. Andrey Campanile at [facility].  CARDIOLOGIST:  [facility]  DISCHARGE DIAGNOSES: 1. Atrial fibrillation. 2. Type 2 diabetes. 3. History of asthma. 4. Hyperlipidemia. 5. Depression. 6. Migraines. 7. Dizziness. 8. Recent upper respiratory infection. 9. Syncope  DISCHARGE MEDICATIONS: 1. Diltiazem 120 mg extended release p.o. daily 2. Lovenox 90 mg subcutaneous twice daily for 5 days. 3. NovoLog 6 units subcutaneous 3 times a day with meals. 4. Lantus 22 units subcutaneous daily at bedtime. 5. Metoprolol 25 mg p.o. twice daily. 6. Mirtazapine 15 mg p.o. daily at bedtime. 7. Niacin sustained release 500 mg p.o. daily at bedtime. 8. Polyethylene glycol 17 g daily as needed for constipation. 9. Tramadol 100 mg p.o. q.6h. 10.Warfarin alternate 10 mg and 50 mg every other day.  The patient     given a prescription for 7 days. 11.Aspirin 325 mg p.o. daily. 12.Albuterol inhaler 2 puffs every 4 hours as needed. 13.Albuterol nebulizer one vial every 6 hours as needed for shortness     of breath. 14.Calcium oyster shell 1 tablet p.o. daily. 15.Cranberry pills 2 tablets p.o. twice daily. 16.Fish oil 2000 mg p.o. twice a day. 17.Flovent HFA 110 mcg 2 puffs three times a day as needed. 18.Lisinopril 20 mg p.o. daily. 19.Metformin 1000 mg p.o. twice daily. 20.Vitamin C 100 mg p.o. daily.  BRIEF ADMITTING HISTORY AND PHYSICAL:  [name] is a 60 year old female with history of diabetes, hypertension, and asthma who presented with a complaint of syncope and chest pain.  The patient was found to be in atrial  fibrillation with RVR and was started on diltiazem drip.  RADIOLOGY/IMAGING: 1. The patient had a chest x-ray, two-view, which shows a normal     chest. 2. The patient had a head CT without contrast, which showed normal     intracranially. Mild bilateral carotid siphon atherosclerosis. 3. The patient had an MRI of the brain without contrast, which showed     a normal brain MRI.  Mild chronic sinusitis 4. in the left frontal sinus.  LABORATORY DATA:  CBC shows white count of 5.9, hemoglobin 12.4, hematocrit 37.3, platelet count 162, INR is 1.51.  Electrolytes normal with a creatinine of 1.19.  Hemoglobin A1c is 8.4.  Initial creatinine presentation is 1.53.  Lipid panel shows total cholesterol is 247. Triglycerides are 427.  TSH is 1.051.  UA was negative for nitrates and leukocytes.  HOSPITAL COURSE BY PROBLEM: 1. Atrial fibrillation with rapid ventricular response.  Initially,     the patient was started on diltiazem drip.  During the course of     the hospital stay, the patient converted to sinus rhythm.  She was     transitioned to p.o. diltiazem and metoprolol was added.  Cardiology consult was obtained, and given her CHADS score of 2.     The patient was started on Lovenox and Coumadin.  After her     INR is therapeutic, Lovenox can be discontinued. 2. Uncontrolled diabetes.  The patient was started on Lantus and     NovoLog and was titrated up as tolerated.  Further titration to be     done as an outpatient.  The patient was continued on metformin. 3. Hypertension.  Continued the patient on lisinopril.  Diltiazem and     metoprolol were added for atrial fibrillation.  Blood pressure was     stable at the time of discharge with a blood pressure of 119/77. 4. Chest pain most likely secondary to atrial fibrillation with rapid     ventricular response, resolved during the course hospital stay. The     patient's troponins were negative x3. 5. Hyperlipidemia with elevated  triglycerides.  The patient was     started on niacin 500 mg sustained release.  Further management of     her hyperlipidemia as an outpatient. 6. Depression.  Continued the patient on mirtazapine. 7. Migraines.  Was not an active issue during the course of the     hospital stay.  Continued the patient on home medications. 8. Dizziness.  The patient had an MRI, which not same show any     significant findings.  The patient was evaluated by the physical     therapy, and it was thought that the patient had vestibular causes     for her dizziness and physical therapy recommended outpatient     physical therapy, which will be arranged at the time of discharge. 9. Syncope. Multifactorial. Most likely due to atrial fibrillation.  DISPOSITION AND FOLLOWUP: 1. The patient to follow up with Cardiology on February 10 at 9:30     a.m. at [facility]. 2. The patient to follow up with Dr. Andrey Campanile at [facility] on October 14, 2010 at 2:00 p.m.  The patient to have her PT/INR checked on     September 15, 2010 at [facility] at 10:30 a.m.  Time spent on discharge, talking to the patient and coordinating care was 35 minutes.   Andreas Blower, MDSR/MEDQ  D:  09/11/2010  T:  09/11/2010  Job:  [id number]  cc:   Dr. Andrey Campanile  Electronically Signed by Wardell Heath REDDY  on 09/11/2010 08:41:47 PM

## 2010-09-12 NOTE — Consult Note (Signed)
NAMEHILLARY, STRUSS               ACCOUNT NO.:  [id number]  MEDICAL RECORD NO.:  [id number]          PATIENT TYPE:  INP  LOCATION:  1408                         FACILITY:  [facility]  PHYSICIAN:  Noralyn Pick. [name], MD, FACCDATE OF BIRTH:  [birth year]  DATE OF CONSULTATION: DATE OF DISCHARGE:                                CONSULTATION   HISTORY OF PRESENT ILLNESS:  Ms. Alcalde is a 60 year old patient admitted to the hospital on September 05, 2010.  She complained of chest pain and syncope.  She was having increasing shortness of breath, being treated by her primary care doctor with the prednisone taper.  Her asthma had flared up and is still bothering her with a nonproductive cough.  There has been no sputum or fever.  In the emergency room, she was found to be in rapid atrial fibrillation.  She has been treated with intermittent IV Cardizem, Lovenox and has already gotten a dose of Coumadin.  She has no previous cardiac history.  CHADS score is 2 with hypertension and diabetes.  Her blood sugars have been poorly controlled on her prednisone taper.  It appears as though she may have to go home on insulin.  She currently denies any chest pain, shortness of breath is persistent, has not had palpitations.  The 2-D echocardiogram was performed and reviewed from September 05, 2010, LV function was normal at 55%-60%. There was moderate left ventricular hypertrophy and no significant valve disease.  Patient has not had previous cardiac problems.  Chest pain has gone away.  There were no acute changes on her EKG.  Enzymes have been negative aside from the atrial fibrillation and there was low voltage on her ECG and poor R-wave progression, but no acute ST-T wave changes.  REVIEW OF SYSTEMS:  Patient's 10-point review of systems is otherwise negative.  PAST MEDICAL HISTORY:  Remarkable for: 1. Hypertension. 2. Diabetes. 3. Migraines. 4. Asthma.  PAST SURGICAL HISTORY: 1. She has had a  previous left ankle fracture repaired in 2008. 2. Arthroscopic surgery of left wrist in 2003. 3. Transposition of the left ulnar nerve and carpal tunnel surgery.  ALLERGIES:  She is allergic to AMITRIPTYLINE.  FAMILY HISTORY:  Remarkable for mother dying of an MI at age 33.  Father died at age 42 of an MI.  Patient lives with fiance.  She is an unemployed Lawyer.  She is a nonsmoker and denies alcohol or drug use.  MEDICATIONS:  Medications on admission, which were followed at [facility] include: 1. An aspirin a day. 2. Lisinopril 20 a day. 3. Albuterol and Flovent inhalers. 4. Metformin 1 g b.i.d.  PHYSICAL EXAMINATION:  GENERAL:  Current exam is remarkable for an overweight black female, in no distress. VITAL SIGNS:  She is in AFib at a rate of 88, blood pressure of 114/71, afebrile, room air sat is 98%. HEENT:  Unremarkable.  Carotids are normal without bruit.  No lymphadenopathy, thyromegaly, or JVP elevation. LUNGS:  Have mild end-expiratory wheezes.  No inspiratory crackles and good diaphragmatic motion. HEART:  S1, S2, normal heart sounds.  PMI not palpable. ABDOMEN:  Benign.  Bowel sounds positive.  No AAA.  No tenderness.  No bruit.  No hepatosplenomegaly or reflux tenderness. EXTREMITIES:  Distal pulses are intact.  No edema. NEUROLOGIC:  Nonfocal. SKIN: Warm and dry. MUSCULOSKELETAL:  No muscle weakness.  LABORATORY DATA:  Current lab work is remarkable for hemoglobin A1c of 8.5.  Pro time of 1.01.  Negative cardiac markers.  Normal renal function at 1.1 with potassium of 4.2.  Platelet count 145, hematocrit 37.  DIAGNOSTIC STUDIES:  CT of the head was negative for trauma.  Carotids have no significant stenosis.  Chest x-ray was clear with no cardiomegaly.  EKG was as reported in the HPI.  IMPRESSION: 1. Atrial fibrillation of unknown duration in the patient with CHADS     score of 2, would continue Lovenox until INR above 2.2.  Her INR     can be followed at  [facility].  She can have outpatient followup     with [place] to further assess need for cardioversion in the future.  We     will stop her intravenous Cardizem and put her on short-acting     Cardizem 30 mg p.o. q.8 h.  Upon discharge, she could probably be     switched to Cardizem CD 120 mg a day.  Given her asthma and upper     respiratory infection exacerbation, would avoid beta blockers.  At     some point in the future, she may benefit from a stress test given     her coronary risk factors, but I do not think this is necessary now     while she is in fibrillation. 2. Diabetes, somewhat poorly controlled as evidenced by high     hemoglobin A1c.  Patient will need teaching and training in regards     to going home on any Lantus insulin.  This can also be followed at     [facility]. 3. Hypertension, currently well controlled.  We will continue     angiotensin-converting enzyme inhibitor given her diabetes and add     p.o. Cardizem for rate control. 4. Upper respiratory infection:  Patient seems to be slow to recover.     Continue nebulizers.  Chest x-ray is clear and this could appear to     be upper airway bronchitis.  She has already had a prednisone    taper.  We would be happy to follow the patient here in the hospital.     Theron Arista C. [name], MD, [facility]    PCN/MEDQ  D:  09/08/2010  T:  09/08/2010  Job:  [id number]  Electronically Signed by Charlton Haws MD [facility] on 09/10/2010 01:03:30 PM

## 2010-09-15 ENCOUNTER — Emergency Department (HOSPITAL_COMMUNITY)
Admission: EM | Admit: 2010-09-15 | Discharge: 2010-09-15 | Payer: Self-pay | Source: Home / Self Care | Admitting: Emergency Medicine

## 2010-09-15 LAB — URINE MICROSCOPIC-ADD ON

## 2010-09-15 LAB — URINALYSIS, ROUTINE W REFLEX MICROSCOPIC
Ketones, ur: 15 mg/dL — AB
Urine Glucose, Fasting: NEGATIVE mg/dL
pH: 5 (ref 5.0–8.0)

## 2010-09-16 NOTE — H&P (Addendum)
[name], [name]               ACCOUNT NO.:  [id number]  MEDICAL RECORD NO.:  [id number]          PATIENT TYPE:  INP  LOCATION:  0103                         FACILITY:  [facility]  PHYSICIAN:  Pincus Large, MD     DATE OF BIRTH:  Jul 11, 1951  DATE OF ADMISSION:  09/05/2010 DATE OF DISCHARGE:                             HISTORY & PHYSICAL   PRIMARY CARE PROVIDER:  Dr. Andrey Campanile at [facility]  The patient is being admitted to [facility].  CHIEF COMPLAINT:  Syncope.  HISTORY OF PRESENT ILLNESS:  [name] is a very pleasant 60 year old female with a history of diabetes, asthma, hypertension, who presents to the [facility] with a chief complaint of syncope and chest pain. Information is obtained from the patient.  She reports that she was walking in the [facility] parking lot this morning on her way to visit a family member who is in the hospital when she developed shortness of breath.  She states that she has been on a prednisone taper for the left 10 days or so as treatment for asthma exacerbation, but when she was walking through the parking lot, her shortness of breath worsened.  She indicates that she had to stop and rest, leaning up against the car. While she was resting, she developed left anterior chest pain that radiated to the left shoulder as well as some dizziness and "cloudiness."  She indicates that she felt as if she was going to pass out, so she put her head down.  She states that after a few seconds, she felt better and so she proceeded to walk on in the parking lot.  Her next memory is a nurse helping her up off the ground.  She lost consciousness very briefly and she felt she did hit her head as well as her left shoulder.  There was no incontinence of bladder or bowel.  She denies any recent fever, chills, nausea, vomiting, diarrhea.  She does endorse a headache at the time of my examination but did not have one prior to falling.   Workup in the ED yields a creatinine of 1.5 as well as A-fib with rapid ventricular response.  We are asked to admit for further evaluation and treatment.  ALLERGIES:  AMITRIPTYLINE.  PAST MEDICAL HISTORY: 1. Hypertension. 2. Diabetes. 3. Migraines. 4. Asthma.  PAST SURGICAL HISTORY: 1. Status post closed reduction of left ankle fracture in March 2008. 2. Status post arthroscopic surgery of the left wrist in June 2003. 3. Status post subcutaneous transposition of the left ulnar nerve in     the past. 4. Status post left carpal tunnel release in the past.  FAMILY MEDICAL HISTORY:  Her mother deceased at 58 from an MI and she also had CHF.  Father deceased at 62 from an MI.  She has one brother who is deceased at 8 from heart failure.  SOCIAL HISTORY:  She lives with her fiance.  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  Denies drug use.  Denies EtOH.  MEDICATIONS: 1. Cranberry pills 2 tablets p.o. b.i.d. 2. Vitamin C 500 mg  p.o. 1 tablet daily. 3. Calcium oyster shell p.o. 1 tablet daily. 4. Fish oil 1000 mg p.o. 2 tablets b.i.d. 5. Aspirin enteric-coated 81 mg p.o. daily. 6. Lisinopril 20 mg p.o. daily. 7. Albuterol nebulization inhaled 1 bottle every 6 hours as needed for     shortness of breath. 8. Flovent HFA 110 mcg inhale 2 puffs t.i.d. as needed. 9. Albuterol inhaler, inhale 2 puffs every 4 hours as needed for     shortness of breath. 10.Metformin 1000 mg p.o. b.i.d.  REVIEW OF SYSTEMS:  GENERAL:  Negative fever, chills, anorexia, unintentional weight loss. ENT:  Negative ear pain, nasal congestion, sore throat. CARDIOVASCULAR: See HPI. RESPIRATORY:  See HPI. MUSCULOSKELETAL:  Negative for joint pain, muscle weakness. NEUROLOGIC:  See HPI. GASTROINTESTINAL:  Negative for nausea, vomiting, abdominal pain, constipation, diarrhea, melena. GENITOURINARY:  Negative for dysuria, hematuria, frequency or urgency. PSYCH:  Negative for depression,  anxiety. HEME:  Negative for any unusual bruising or bleeding.  LABORATORY DATA:  Sodium 139, potassium 3.9, chloride 99, CO2 of 23, BUN 25, creatinine 1.53, glucose 238, WBCs 11.0, hemoglobin 14.5, hematocrit 42.0, platelets 221, CK-MB 1.1, troponin I less 0.05, myoglobin 72.9. Urine with greater than 1000 glucose, 30 protein, otherwise negative.  Radiology, chest x-ray, heart size and vascularity are normal and lungs are clear.  EKG reviewed by Dr. Boris Sharper revealed A- fib with rapid ventricular response at a rate of 112.  PHYSICAL EXAM:  VITAL SIGNS:  T 98.0, BP 138/72, heart rate 104, respirations 20, sats 98% on room air. GENERAL:  Awake, alert, well nourished, well hydrated, no acute distress. HEENT:  Head, normocephalic.  Mucous membranes of her mouth are moist and pink.  Pupils equal, round, reactive to light.  EOMI.  No lesions or exudate in nose or ears. NECK:  Supple.  No JVD.  Full range of motion.  No lymphadenopathy. CARDIOVASCULAR:  Irregularly irregular rhythm at 104.  No murmur, gallop, or rub.  No lower extremity edema.  Pedal pulses present, palpable. RESPIRATORY:  No increased work of breathing.  Breath sounds clear to auscultation bilaterally.  No rhonchi, wheezes, or rales. ABDOMEN:  Obese, soft, positive bowel sounds throughout, nontender to palpation.  No mass or organomegaly noted. NEURO:  Alert and oriented x3.  Cranial nerves II-XII grossly intact. Speech clear.  Facial symmetry. MUSCULOSKELETAL:  Moves all extremities.  No joint swelling/erythema. EXTREMITIES:  Without clubbing or cyanosis.  ASSESSMENT/PLAN: 1. Syncope, etiology unclear but probably multifactorial.  Will admit     to tele.  Will cycle cardiac enzymes, get a 2-D echo.  Will check a     D-dimer and TSH.  Will provide nitro and oxygen support, monitor     sats. 2. Atrial fibrillation with rapid ventricular response, currently rate     controlled.  Will monitor.  If rate becomes  uncontrolled, will     start a Cardizem drip.  Will check a TSH.  Will start Lovenox full     dose. 3. Chest pain.  Will cycle cardiac enzymes, monitor her on tele, get a     2-D echo and D-dimer.  If D-dimer is elevated, will check a CT     angio of the chest. 4. Acute renal insufficiency, most likely prerenal.  Creatinine is     currently 1.58.  Will hold nephrotoxins.  Will gently hydrate and     recheck in the a.m. 5. Diabetes type 2.  We will hold her metformin, use sliding scale  glycemic control.  We will get a hemoglobin A1c. 6. Hypertension.  Blood pressure is 122/71.  Hold lisinopril for now     secondary to acute renal insufficiency.  Will monitor closely. 7. History of asthma, currently on nebulizers, will continue.  We will     monitor sats. 8. Deep venous thrombosis.  The patient is being placed on Lovenox     full dose.  This assessment and plan was discussed with Dr. Boris Sharper.     Gwenyth Bender, NP   ______________________________ Pincus Large, MD    KMB/MEDQ  D:  09/05/2010  T:  09/05/2010  Job:  [id number]  cc:   Dr. Andrey Campanile at [facility]  Electronically Signed by Toya Smothers  on 09/16/2010 12:43:33 PM Electronically Signed by Pincus Large MD on 09/18/2010 03:34:09 PM

## 2010-09-19 ENCOUNTER — Encounter (INDEPENDENT_AMBULATORY_CARE_PROVIDER_SITE_OTHER): Payer: Self-pay | Admitting: *Deleted

## 2010-09-19 ENCOUNTER — Encounter (INDEPENDENT_AMBULATORY_CARE_PROVIDER_SITE_OTHER): Payer: Self-pay | Admitting: Family Medicine

## 2010-09-19 LAB — CONVERTED CEMR LAB
ALT: 38 units/L — ABNORMAL HIGH (ref 0–35)
AST: 26 units/L (ref 0–37)
Basophils Absolute: 0 10*3/uL (ref 0.0–0.1)
Basophils Relative: 0 % (ref 0–1)
Calcium: 9.5 mg/dL (ref 8.4–10.5)
Chloride: 102 meq/L (ref 96–112)
Creatinine, Ser: 1.14 mg/dL (ref 0.40–1.20)
Eosinophils Absolute: 0.1 10*3/uL (ref 0.0–0.7)
Eosinophils Relative: 1 % (ref 0–5)
HCT: 36.9 % (ref 36.0–46.0)
MCHC: 32.8 g/dL (ref 30.0–36.0)
MCV: 88.1 fL (ref 78.0–100.0)
Platelets: 245 10*3/uL (ref 150–400)
RDW: 14.7 % (ref 11.5–15.5)
Total Bilirubin: 0.3 mg/dL (ref 0.3–1.2)

## 2010-09-23 ENCOUNTER — Emergency Department (HOSPITAL_COMMUNITY): Payer: Self-pay

## 2010-09-23 ENCOUNTER — Emergency Department (HOSPITAL_COMMUNITY)
Admission: EM | Admit: 2010-09-23 | Discharge: 2010-09-24 | Disposition: A | Discharge status: Home / Self Care | Payer: Self-pay | Attending: Emergency Medicine | Admitting: Emergency Medicine

## 2010-09-23 DIAGNOSIS — E119 Type 2 diabetes mellitus without complications: Secondary | ICD-10-CM | POA: Insufficient documentation

## 2010-09-23 DIAGNOSIS — R197 Diarrhea, unspecified: Secondary | ICD-10-CM | POA: Insufficient documentation

## 2010-09-23 DIAGNOSIS — Z794 Long term (current) use of insulin: Secondary | ICD-10-CM | POA: Insufficient documentation

## 2010-09-23 DIAGNOSIS — N1330 Unspecified hydronephrosis: Secondary | ICD-10-CM | POA: Insufficient documentation

## 2010-09-23 DIAGNOSIS — R1031 Right lower quadrant pain: Secondary | ICD-10-CM | POA: Insufficient documentation

## 2010-09-23 DIAGNOSIS — I10 Essential (primary) hypertension: Secondary | ICD-10-CM | POA: Insufficient documentation

## 2010-09-23 DIAGNOSIS — Z7901 Long term (current) use of anticoagulants: Secondary | ICD-10-CM | POA: Insufficient documentation

## 2010-09-23 DIAGNOSIS — R319 Hematuria, unspecified: Secondary | ICD-10-CM | POA: Insufficient documentation

## 2010-09-23 DIAGNOSIS — E7800 Pure hypercholesterolemia, unspecified: Secondary | ICD-10-CM | POA: Insufficient documentation

## 2010-09-23 DIAGNOSIS — N23 Unspecified renal colic: Secondary | ICD-10-CM | POA: Insufficient documentation

## 2010-09-23 DIAGNOSIS — R112 Nausea with vomiting, unspecified: Secondary | ICD-10-CM | POA: Insufficient documentation

## 2010-09-23 LAB — CBC
Platelets: 251 10*3/uL (ref 150–400)
RBC: 4.3 MIL/uL (ref 3.87–5.11)
RDW: 13.8 % (ref 11.5–15.5)
WBC: 6.8 10*3/uL (ref 4.0–10.5)

## 2010-09-23 LAB — DIFFERENTIAL
Basophils Relative: 0 % (ref 0–1)
Eosinophils Absolute: 0.1 10*3/uL (ref 0.0–0.7)
Neutrophils Relative %: 73 % (ref 43–77)

## 2010-09-24 ENCOUNTER — Encounter (HOSPITAL_COMMUNITY): Payer: Self-pay

## 2010-09-24 ENCOUNTER — Emergency Department (HOSPITAL_COMMUNITY): Payer: Self-pay

## 2010-09-24 LAB — COMPREHENSIVE METABOLIC PANEL
AST: 33 U/L (ref 0–37)
Albumin: 4.2 g/dL (ref 3.5–5.2)
Alkaline Phosphatase: 86 U/L (ref 39–117)
CO2: 25 mEq/L (ref 19–32)
Chloride: 103 mEq/L (ref 96–112)
Creatinine, Ser: 1.33 mg/dL — ABNORMAL HIGH (ref 0.4–1.2)
GFR calc Af Amer: 50 mL/min — ABNORMAL LOW (ref >60)
GFR calc non Af Amer: 41 mL/min — ABNORMAL LOW (ref >60)
Potassium: 4.1 mEq/L (ref 3.5–5.1)
Total Bilirubin: 0.4 mg/dL (ref 0.3–1.2)

## 2010-09-24 LAB — URINALYSIS, ROUTINE W REFLEX MICROSCOPIC
Ketones, ur: NEGATIVE mg/dL
Nitrite: NEGATIVE
Specific Gravity, Urine: 1.022 (ref 1.005–1.030)
Urobilinogen, UA: 0.2 mg/dL (ref 0.0–1.0)
pH: 5 (ref 5.0–8.0)

## 2010-09-24 LAB — URINE MICROSCOPIC-ADD ON

## 2010-09-26 ENCOUNTER — Telehealth (INDEPENDENT_AMBULATORY_CARE_PROVIDER_SITE_OTHER): Payer: Self-pay | Admitting: *Deleted

## 2010-09-26 ENCOUNTER — Encounter (INDEPENDENT_AMBULATORY_CARE_PROVIDER_SITE_OTHER): Payer: 59 | Admitting: Physician Assistant

## 2010-09-26 ENCOUNTER — Encounter: Payer: Self-pay | Admitting: Physician Assistant

## 2010-09-26 DIAGNOSIS — R079 Chest pain, unspecified: Secondary | ICD-10-CM | POA: Insufficient documentation

## 2010-09-26 DIAGNOSIS — N200 Calculus of kidney: Secondary | ICD-10-CM | POA: Insufficient documentation

## 2010-09-26 DIAGNOSIS — I4891 Unspecified atrial fibrillation: Secondary | ICD-10-CM

## 2010-09-30 ENCOUNTER — Encounter (INDEPENDENT_AMBULATORY_CARE_PROVIDER_SITE_OTHER): Payer: Self-pay | Admitting: *Deleted

## 2010-09-30 ENCOUNTER — Encounter: Payer: Self-pay | Admitting: Cardiology

## 2010-09-30 ENCOUNTER — Ambulatory Visit (HOSPITAL_COMMUNITY): Payer: Self-pay | Attending: Cardiovascular Disease

## 2010-09-30 DIAGNOSIS — R55 Syncope and collapse: Secondary | ICD-10-CM | POA: Insufficient documentation

## 2010-09-30 DIAGNOSIS — R079 Chest pain, unspecified: Secondary | ICD-10-CM | POA: Insufficient documentation

## 2010-09-30 DIAGNOSIS — R0609 Other forms of dyspnea: Secondary | ICD-10-CM | POA: Insufficient documentation

## 2010-09-30 DIAGNOSIS — R9431 Abnormal electrocardiogram [ECG] [EKG]: Secondary | ICD-10-CM

## 2010-09-30 DIAGNOSIS — R5383 Other fatigue: Secondary | ICD-10-CM | POA: Insufficient documentation

## 2010-09-30 DIAGNOSIS — R0602 Shortness of breath: Secondary | ICD-10-CM | POA: Insufficient documentation

## 2010-09-30 DIAGNOSIS — R0989 Other specified symptoms and signs involving the circulatory and respiratory systems: Secondary | ICD-10-CM | POA: Insufficient documentation

## 2010-09-30 DIAGNOSIS — R5381 Other malaise: Secondary | ICD-10-CM | POA: Insufficient documentation

## 2010-09-30 DIAGNOSIS — I4891 Unspecified atrial fibrillation: Secondary | ICD-10-CM | POA: Insufficient documentation

## 2010-09-30 DIAGNOSIS — R0789 Other chest pain: Secondary | ICD-10-CM

## 2010-10-01 ENCOUNTER — Encounter (INDEPENDENT_AMBULATORY_CARE_PROVIDER_SITE_OTHER): Payer: Self-pay | Admitting: *Deleted

## 2010-10-02 NOTE — Assessment & Plan Note (Addendum)
Summary: eph per Dr. Antoine Poche   Primary Provider:  Dala Dock   History of Present Illness: Primary Cardiologist:  Dr. Charlton Haws  [name] is a 60 yo female with a h/o diabetes, hypertension, hyperlipidemia and asthma who was admitted January 20 with chest pain, syncope and shortness of breath.  She was noted to be in atrial fibrillation with rapid ventricular rate.  Rate control medications were started.  She was placed on warfarin due to her high stroke risk.  Echocardiogram demonstrated an EF of 55-60% and moderate LVH. She eventually converted to normal sinus rhythm.  She returns for followup.  She actually presented back to the emergency room a couple days ago due to renal colic.  Since then, she has not felt well.  She describes a soreness in her left chest.  It's worse with certain movements in her chest.  She also notes some discomfort in her chest when she exerts herself.  This is completely different.  She has some shortness of breath with exertion.  However, overall, she is really describing being tired.  This all started when she started on her new medications which includes Tiazac and metoprolol.  She denies any further syncope.  The patient had a history of this in the past in addition to her recent presentation.  She sleeps on several pillows chronically without change.  She denies pedal edema.  She does have a cough.  She has not noted any increased wheezing.  She attributed  her chest discomfort to her asthma and possibly acid reflux.  Current Medications (verified): 1)  Metformin Hcl 500 Mg Tabs (Metformin Hcl) .... Take 1 Tablet By Mouth Two Times A Day 2)  Nasacort Aq 55 Mcg/act Aers (Triamcinolone Acetonide(Nasal)) .... One Spray Two Times A Day 3)  Cranberry Fruit 405 Mg Caps (Cranberry) .Marland Kitchen.. 1 By Mouth Daily 4)  Mirtazapine 15 Mg Tabs (Mirtazapine) .Marland Kitchen.. 1 By Mouth At Bedtime 5)  Crestor 10 Mg Tabs (Rosuvastatin Calcium) .Marland Kitchen.. 1 By Mouth At Bedtime 6)  Protonix 40 Mg  Tbec (Pantoprazole Sodium) .Marland Kitchen.. 1 By Mouth Daily 7)  Tiazac 120 Mg Xr24h-Cap (Diltiazem Hcl Er Beads) .Marland Kitchen.. 1 By Mouth Daily 8)  Niaspan 500 Mg Cr-Tabs (Niacin (Antihyperlipidemic)) .Marland Kitchen.. 1 By Mouth At Bedtime 9)  Oyster Shell Calcium/d 500-200 Mg-Unit Tabs (Calcium Carbonate-Vitamin D) .Marland Kitchen.. 1 By Mouth Daily 10)  Metoprolol Tartrate 25 Mg Tabs (Metoprolol Tartrate) .Marland Kitchen.. 1 By Mouth Two Times A Day 11)  Vitamin C 500 Mg  Tabs (Ascorbic Acid) .Marland Kitchen.. 1 By Mouth Daily 12)  Tramadol Hcl 50 Mg Tabs (Tramadol Hcl) .... As Needed 13)  Aspirin 325 Mg  Tabs (Aspirin) .Marland Kitchen.. 1 By Mouth Daily 14)  Coumadin 5 Mg Tabs (Warfarin Sodium) .... Uad 15)  Fish Oil   Oil (Fish Oil) .Marland Kitchen.. 1 By Mouth Daily 16)  Lisinopril 20 Mg Tabs (Lisinopril) .Marland Kitchen.. 1 By Mouth Daily 17)  Prednisone 20 Mg Tabs (Prednisone) .... As Directed 18)  Novolog 100 Unit/ml Soln (Insulin Aspart) .... Uad 19)  Lantus 100 Unit/ml Soln (Insulin Glargine) .... Uad  Allergies (verified): 1)  ! * Amitriptylline 2)  ! * Dilaudid  Past History:  Past Medical History: Diabetes mellitus, type II Hyperlipidemia Hypertension Parox. AFib   a. coumadin (CHADS2 = 2) Echo 09/05/10: Ef 55-60%; mod LVH Asthma  Review of Systems       As per  the HPI.  All other systems reviewed and negative.   Vital Signs:  Patient profile:   59  year old female Height:      67 inches Weight:      201 pounds BMI:     31.59 Pulse rate:   58 / minute Resp:     16 per minute BP sitting:   136 / 72  (right arm)  Vitals Entered By: Marrion Coy, CNA (September 26, 2010 10:02 AM)   Physical Exam  General:  Well nourished, well developed, in no acute distress HEENT: normal Neck: no JVD Cardiac:  normal S1, S2; RRR; no murmur Lungs:  clear to auscultation bilaterally, no wheezing, rhonchi or rales Abd: soft, nontender, no hepatomegaly Ext: no edema Skin: warm and dry Neuro:  CNs 2-12 intact, no focal abnormalities noted    EKG  Procedure date:   09/26/2010  Findings:      Sinus bradycardia Heart rate 59 T wave inversions in leads 3, aVF; subtle T-wave inversions in V3-V6 T wave changes present in prior EKGs; today's tracing seems to be more prominent than prior tracings especially in the precordial leads  Impression & Recommendations:  Problem # 1:  CHEST PAIN (ICD-786.50) Etiology unclear.  She does have some EKG changes.  However, some of these changes have been present on prior EKGs.  Her symptoms really are somewhat atypical.  Dr. [name] mentioned proceeding with a nuclear scan at some point.  I think given her chest symptoms, we should pursue that now.  She will be set up for a stress Myoview study in the next few days.  I also gave her prescription for nitroglycerin to use as needed.  She should continue on aspirin as well.  She will be brought back to followup with Dr. [name] the next few weeks.  She knows to call sooner if she has a change in her symptoms or to go to the emergency room if she feels worse.  Orders: EKG w/ Interpretation (93000) Nuclear Stress Test (Nuc Stress Test)  Problem # 2:  ATRIAL FIBRILLATION (ICD-427.31) She is currently doing well and is maintaining normal sinus rhythm.  She does describe a lot of weakness and fatigue.  She does have sinus bradycardia on her electrocardiogram.  I have asked her to go ahead and taper off the metoprolol.  Hopefully, this will alleviate some of her symptoms.  It may well be causing bronchospasm leading to her dypsnea as well.  She will continue on Coumadin.  Problem # 3:  NEPHROLITHIASIS (ICD-592.0) She had an elevated creatinine when she went to the emergency room the other night at 1.33.  I've asked her to get a repeat basic metabolic panel when she returns to see her primary care provider next week at [facility].  Problem # 4:  HYPERTENSION (ICD-401.9) Controlled.  Problem # 5:  DIABETES MELLITUS, TYPE II (ICD-250.00) F/u with PCP.  Problem # 6:  HYPERLIPIDEMIA  (ICD-272.4) Followed by PCP.  Problem # 7:  COUMADIN THERAPY (ICD-V58.61) Followed at [facility].  Patient Instructions: 1)  Your physician recommends that you schedule a follow-up appointment in: 2 weeks with Dr. [name] as per Tereso Newcomer, PA-C. 2)  Your physician has recommended you make the following change in your medication:  Take 1/2 tablet of metoprolol twice daily for 2 days then stop. A prescription for Nitroglycerin has been sent to [facility] at [address] today.  3)  Your physician has requested that you have an exercise stress myoview.  For further information please visit [email or web address]  Please follow instruction sheet, as given. Prescriptions: NITROSTAT 0.4 MG SUBL (NITROGLYCERIN) 1  tablet under tongue at onset of chest pain; you may repeat every 5 minutes for up to 3 doses.  #25 x 6   Entered by:   Danielle Rankin, CMA   Authorized by:   Tereso Newcomer PA-C   Signed by:   Danielle Rankin, CMA on 09/26/2010   Method used:   Electronically to        [facility] [phone]* (retail)       [address]       [place], [place]  [address]       Ph: [phone]       Fax: [phone]   RxID:   [id number]  I have personally reviewed the prescriptions today for accuracy.. . Tereso Newcomer PA-C  September 26, 2010 2:17 PM

## 2010-10-02 NOTE — Progress Notes (Signed)
Summary: Nuclear Pre-Procedure  Phone Note Outgoing Call Call back at [facility] Phone [phone]   Call placed by: Stanton Kidney, EMT-P,  September 26, 2010 12:59 PM Call placed to: Patient Action Taken: Phone Call Completed Summary of Call: Reviewed information on Myoview Information Sheet (see scanned document for further details).  Spoke with Patient directly, and is aware of instructions. Stanton Kidney, EMT-P  September 26, 2010 1:00 PM     Nuclear Med Background Indications for Stress Test: Evaluation for Ischemia, Post Hospital  Indications Comments: 09/05/10 CP/syncope/ShOB  History: Asthma, Echo  History Comments: 09/05/10 Echo: EF=55-60%, mod LVH  Symptoms: Chest Pain, Chest Pain with Exertion, DOE, Fatigue, SOB, Syncope    Nuclear Pre-Procedure Cardiac Risk Factors: Hypertension, IDDM Type 2, Lipids

## 2010-10-03 ENCOUNTER — Encounter (INDEPENDENT_AMBULATORY_CARE_PROVIDER_SITE_OTHER): Payer: Self-pay | Admitting: *Deleted

## 2010-10-03 ENCOUNTER — Telehealth: Payer: Self-pay | Admitting: Cardiology

## 2010-10-03 LAB — CONVERTED CEMR LAB
AST: 28 units/L (ref 0–37)
Alkaline Phosphatase: 69 units/L (ref 39–117)
BUN: 12 mg/dL (ref 6–23)
Basophils Absolute: 0 10*3/uL (ref 0.0–0.1)
Basophils Relative: 1 % (ref 0–1)
Creatinine, Ser: 1.16 mg/dL (ref 0.40–1.20)
Eosinophils Relative: 2 % (ref 0–5)
Glucose, Bld: 110 mg/dL — ABNORMAL HIGH (ref 70–99)
HCT: 35.8 % — ABNORMAL LOW (ref 36.0–46.0)
Hemoglobin: 11.8 g/dL — ABNORMAL LOW (ref 12.0–15.0)
MCHC: 33 g/dL (ref 30.0–36.0)
MCV: 88.4 fL (ref 78.0–100.0)
Monocytes Absolute: 0.3 10*3/uL (ref 0.1–1.0)
Monocytes Relative: 7 % (ref 3–12)
Neutro Abs: 1.5 10*3/uL — ABNORMAL LOW (ref 1.7–7.7)
Potassium: 3.7 meq/L (ref 3.5–5.3)
RBC: 4.05 M/uL (ref 3.87–5.11)
RDW: 14.4 % (ref 11.5–15.5)
Total Bilirubin: 0.4 mg/dL (ref 0.3–1.2)

## 2010-10-08 NOTE — Letter (Signed)
Summary: Generic Letter  Architectural technologist, Main Office  [address]   [place], [place] [address]   Phone: [phone]  Fax: [phone]        October 01, 2010 MRN: [id number]    [facility] Physcian/Nurse:Ms. Ramesh, Blood work.    Ms. Noh was in our office 09/26/10 to see Tereso Newcomer, PA-C. He asked Ms.Skerritt to please follow up with [facility] in having her coumadin level checked and while she was there to have a repeat BMET 401.1 while at [facility]. I hope this letter is sufficient for Ms. Hollar to be able to get her BMET drawn; if not Tereso Newcomer, PA-C will be back in the office 10/02/10 and if needed we will send a hand written order for you. Please let me know if this will work.    Sincerely,  Danielle Rankin, CMA  This letter has been electronically signed by your physician.

## 2010-10-08 NOTE — Assessment & Plan Note (Addendum)
Summary: Cardiology Nuclear Testing  Nuclear Med Background Indications for Stress Test: Evaluation for Ischemia, Post Hospital  Indications Comments: 09/05/10 CP/Syncope/SOB  History: Asthma, Echo  History Comments: 09/05/10 Echo: EF=55-60%, mod LVH Hx. AFIB.  Symptoms: Chest Pain, Chest Pain with Exertion, Chest Pressure, Chest Pressure with Exertion, Diaphoresis, Dizziness, DOE, Fatigue, Fatigue with Exertion, Light-Headedness, Nausea, Near Syncope, Palpitations, Rapid HR, SOB, Syncope, Vomiting  Symptoms Comments: Last CP 09/26/10.   Nuclear Pre-Procedure Cardiac Risk Factors: Family History - CAD, Hypertension, IDDM Type 2, Lipids Caffeine/Decaff Intake: None NPO After: 5:00 AM Lungs: Clear IV 0.9% NS with Angio Cath: 18g     IV Site: R Antecubital IV Started by: Stanton Kidney, EMT-P Chest Size (in) 40     Cup Size DD     Height (in): 66 Weight (lb): 200 BMI: 32.40 Tech Comments: CBG=136 at 5 am this day, per patient. The patient complained of feeling shakey after walking the  treadmill, BS checked was 138 at 1 1/2 hr pc.  Nuclear Med Study 1 or 2 day study:  1 day     Stress Test Type:  Stress Reading MD:  Olga Millers, MD     Referring MD:  Charlton Haws Resting Radionuclide:  Technetium 52m Tetrofosmin     Resting Radionuclide Dose:  11 mCi  Stress Radionuclide:  Technetium 57m Tetrofosmin     Stress Radionuclide Dose:  33 mCi   Stress Protocol Exercise Time (min):  5:45 min     Max HR:  150 bpm     Predicted Max HR:  162 bpm  Max Systolic BP: 215 mm Hg     Percent Max HR:  92.59 %     METS: 7.0 Rate Pressure Product:  [address]    Stress Test Technologist:  Irean Hong,  RN     Nuclear Technologist:  Domenic Polite, CNMT  Rest Procedure  Myocardial perfusion imaging was performed at rest 45 minutes following the intravenous administration of Technetium 50m Tetrofosmin.  Stress Procedure  The patient exercised for 5 minutes and 45 seconds, RPE=17. The patient  stopped due to DOE,fatique, and complained of chest tightness, 5/10 which improved after NTG x 1.  There were nonspecific ST-T wave changes. The patient had a hypertensive response to exercise.  Technetium 20m Tetrofosmin was injected at peak exercise and myocardial perfusion imaging was performed after a brief delay. The patient complained while under camera mild chest pain. An EKG was repeated. Dr. Burna Forts reviewed the EKG, Images, and elevated BP response to exercise with orders to increase the lisinopril to 40 mg a day. The patient has a follow-up visit with Dr. [name] on 10/15/10 at 4:00pm.  QPS Raw Data Images:  Acquisition technically good; normal left ventricular size. Stress Images:  Normal homogeneous uptake in all areas of the myocardium. Rest Images:  Normal homogeneous uptake in all areas of the myocardium. Subtraction (SDS):  No evidence of ischemia. Transient Ischemic Dilatation:  .99  (Normal <1.22)  Lung/Heart Ratio:  .40  (Normal <0.45)  Quantitative Gated Spect Images QGS EDV:  70 ml QGS ESV:  19 ml QGS EF:  73 % QGS cine images:  Normal wall motion.   Overall Impression  Exercise Capacity: Fair exercise capacity. BP Response: Hypertensive blood pressure response. Clinical Symptoms: There is chest pain ECG Impression: No significant ST segment change suggestive of ischemia. Overall Impression: Normal stress nuclear study with no ischemia or infarction.  Appended Document: Cardiology Nuclear Testing normal  Appended Document: Cardiology Nuclear Testing  Started on lisinopril for BP  Nulcear nonischemic but had chest pain and got nitro F/U with me or Scott for further w/u  Appended Document: Cardiology Nuclear Testing Left message to call back    Appended Document: Cardiology Nuclear Testing Pt. notified

## 2010-10-08 NOTE — Progress Notes (Signed)
Summary: test results  Phone Note Call from Patient Call back at Home Phone [phone]   Caller: Patient Reason for Call: Talk to Nurse, Lab or Test Results Initial call taken by: Lorne Skeens,  October 03, 2010 8:32 AM  Follow-up for Phone Call        Given stress test results. Pt aware to keep scheduled appt with Dr. [name] on October 15, 2010. Follow-up by: Dossie Arbour, RN, BSN,  October 03, 2010 8:51 AM

## 2010-10-08 NOTE — Miscellaneous (Signed)
  Clinical Lists Changes pt here for nuclear with elevated bp, per dr [name] pt to increase lisinopril to 40mg  once daily Deliah Goody, RN  September 30, 2010 1:58 PM  Medications: Added new medication of LISINOPRIL 40 MG TABS (LISINOPRIL) Take one tablet by mouth daily - Signed Rx of LISINOPRIL 40 MG TABS (LISINOPRIL) Take one tablet by mouth daily;  #30 x 12;  Signed;  Entered by: Deliah Goody, RN;  Authorized by: Colon Branch, MD, [facility];  Method used: Faxed to [facility], [address], [place], [place]  [address], Ph: [phone], Fax: [phone]    Prescriptions: LISINOPRIL 40 MG TABS (LISINOPRIL) Take one tablet by mouth daily  #30 x 12   Entered by:   Deliah Goody, RN   Authorized by:   Colon Branch, MD, [facility]   Signed by:   Deliah Goody, RN on 09/30/2010   Method used:   Faxed to ...       [facility] (retail)       [address] [place], [place]  [address]       Ph: [phone]       Fax: [phone]   RxID:   [id number]

## 2010-10-15 ENCOUNTER — Ambulatory Visit (INDEPENDENT_AMBULATORY_CARE_PROVIDER_SITE_OTHER): Payer: Self-pay | Admitting: Cardiovascular Disease

## 2010-10-15 ENCOUNTER — Ambulatory Visit: Payer: 59 | Admitting: Cardiovascular Disease

## 2010-10-15 ENCOUNTER — Ambulatory Visit: Payer: Self-pay | Admitting: Cardiovascular Disease

## 2010-10-15 ENCOUNTER — Encounter: Payer: Self-pay | Admitting: Cardiovascular Disease

## 2010-10-15 DIAGNOSIS — I10 Essential (primary) hypertension: Secondary | ICD-10-CM

## 2010-10-15 DIAGNOSIS — I4891 Unspecified atrial fibrillation: Secondary | ICD-10-CM

## 2010-10-15 DIAGNOSIS — E119 Type 2 diabetes mellitus without complications: Secondary | ICD-10-CM

## 2010-10-15 DIAGNOSIS — R079 Chest pain, unspecified: Secondary | ICD-10-CM

## 2010-10-16 ENCOUNTER — Emergency Department (HOSPITAL_COMMUNITY)
Admission: EM | Admit: 2010-10-16 | Discharge: 2010-10-17 | Disposition: A | Discharge status: Other Acute Inpatient Hospital | Payer: Medicaid Other | Source: Home / Self Care | Attending: Emergency Medicine | Admitting: Emergency Medicine

## 2010-10-16 ENCOUNTER — Emergency Department (HOSPITAL_COMMUNITY): Payer: Medicaid Other

## 2010-10-16 DIAGNOSIS — R002 Palpitations: Secondary | ICD-10-CM

## 2010-10-16 DIAGNOSIS — I4891 Unspecified atrial fibrillation: Secondary | ICD-10-CM

## 2010-10-16 DIAGNOSIS — E119 Type 2 diabetes mellitus without complications: Secondary | ICD-10-CM | POA: Insufficient documentation

## 2010-10-16 DIAGNOSIS — I10 Essential (primary) hypertension: Secondary | ICD-10-CM | POA: Insufficient documentation

## 2010-10-16 DIAGNOSIS — R072 Precordial pain: Secondary | ICD-10-CM | POA: Insufficient documentation

## 2010-10-16 DIAGNOSIS — E7800 Pure hypercholesterolemia, unspecified: Secondary | ICD-10-CM | POA: Insufficient documentation

## 2010-10-16 LAB — DIFFERENTIAL
Basophils Absolute: 0 10*3/uL (ref 0.0–0.1)
Basophils Relative: 0 % (ref 0–1)
Eosinophils Relative: 1 % (ref 0–5)
Monocytes Absolute: 0.4 10*3/uL (ref 0.1–1.0)
Monocytes Relative: 7 % (ref 3–12)
Neutro Abs: 3.8 10*3/uL (ref 1.7–7.7)

## 2010-10-16 LAB — TROPONIN I: Troponin I: <0.01 ng/mL (ref 0.00–0.06)

## 2010-10-16 LAB — POCT I-STAT, CHEM 8
BUN: 13 mg/dL (ref 6–23)
Calcium, Ion: 1.15 mmol/L (ref 1.12–1.32)
Chloride: 111 mEq/L (ref 96–112)
Glucose, Bld: 72 mg/dL (ref 70–99)
TCO2: 20 mmol/L (ref 0–100)

## 2010-10-16 LAB — CBC
HCT: 36.9 % (ref 36.0–46.0)
Hemoglobin: 12.1 g/dL (ref 12.0–15.0)
MCH: 28.9 pg (ref 26.0–34.0)
MCHC: 32.8 g/dL (ref 30.0–36.0)
RDW: 14.5 % (ref 11.5–15.5)

## 2010-10-16 LAB — POCT CARDIAC MARKERS
Myoglobin, poc: 63.1 ng/mL (ref 12–200)
Troponin i, poc: <0.05 ng/mL (ref 0.00–0.09)

## 2010-10-16 LAB — PROTIME-INR: Prothrombin Time: 23.3 seconds — ABNORMAL HIGH (ref 11.6–15.2)

## 2010-10-17 ENCOUNTER — Inpatient Hospital Stay (HOSPITAL_COMMUNITY)
Admission: EM | Admit: 2010-10-17 | Discharge: 2010-10-21 | DRG: 310 | Disposition: A | Discharge status: Home / Self Care | Payer: Medicaid Other | Source: Other Acute Inpatient Hospital | Attending: Cardiovascular Disease | Admitting: Cardiovascular Disease

## 2010-10-17 DIAGNOSIS — N183 Chronic kidney disease, stage 3 unspecified: Secondary | ICD-10-CM | POA: Diagnosis present

## 2010-10-17 DIAGNOSIS — E876 Hypokalemia: Secondary | ICD-10-CM | POA: Diagnosis present

## 2010-10-17 DIAGNOSIS — Z7901 Long term (current) use of anticoagulants: Secondary | ICD-10-CM

## 2010-10-17 DIAGNOSIS — E119 Type 2 diabetes mellitus without complications: Secondary | ICD-10-CM | POA: Diagnosis present

## 2010-10-17 DIAGNOSIS — J45909 Unspecified asthma, uncomplicated: Secondary | ICD-10-CM | POA: Diagnosis present

## 2010-10-17 DIAGNOSIS — I4891 Unspecified atrial fibrillation: Principal | ICD-10-CM | POA: Diagnosis present

## 2010-10-17 DIAGNOSIS — I129 Hypertensive chronic kidney disease with stage 1 through stage 4 chronic kidney disease, or unspecified chronic kidney disease: Secondary | ICD-10-CM | POA: Diagnosis present

## 2010-10-17 DIAGNOSIS — Z794 Long term (current) use of insulin: Secondary | ICD-10-CM

## 2010-10-17 DIAGNOSIS — E785 Hyperlipidemia, unspecified: Secondary | ICD-10-CM | POA: Diagnosis present

## 2010-10-17 LAB — CARDIAC PANEL(CRET KIN+CKTOT+MB+TROPI)
CK, MB: 1.3 ng/mL (ref 0.3–4.0)
Relative Index: 1.3 (ref 0.0–2.5)
Relative Index: INVALID (ref 0.0–2.5)
Total CK: 97 U/L (ref 7–177)
Total CK: 96 U/L (ref 7–177)
Troponin I: 0.01 ng/mL (ref 0.00–0.06)
Troponin I: <0.01 ng/mL (ref 0.00–0.06)

## 2010-10-17 LAB — BASIC METABOLIC PANEL
CO2: 23 mEq/L (ref 19–32)
Chloride: 108 mEq/L (ref 96–112)
Creatinine, Ser: 1.25 mg/dL — ABNORMAL HIGH (ref 0.4–1.2)
GFR calc Af Amer: 53 mL/min — ABNORMAL LOW (ref >60)
Potassium: 3.9 mEq/L (ref 3.5–5.1)

## 2010-10-17 LAB — MAGNESIUM: Magnesium: 1.9 mg/dL (ref 1.5–2.5)

## 2010-10-17 LAB — GLUCOSE, CAPILLARY
Glucose-Capillary: 189 mg/dL — ABNORMAL HIGH (ref 70–99)
Glucose-Capillary: 138 mg/dL — ABNORMAL HIGH (ref 70–99)
Glucose-Capillary: 124 mg/dL — ABNORMAL HIGH (ref 70–99)

## 2010-10-17 LAB — PROTIME-INR
Prothrombin Time: 20.1 seconds — ABNORMAL HIGH (ref 11.6–15.2)
Prothrombin Time: 19.1 seconds — ABNORMAL HIGH (ref 11.6–15.2)

## 2010-10-18 DIAGNOSIS — I4891 Unspecified atrial fibrillation: Secondary | ICD-10-CM

## 2010-10-18 LAB — PROTIME-INR
INR: 1.41 (ref 0.00–1.49)
Prothrombin Time: 17.5 seconds — ABNORMAL HIGH (ref 11.6–15.2)

## 2010-10-18 LAB — BASIC METABOLIC PANEL
BUN: 11 mg/dL (ref 6–23)
CO2: 22 mEq/L (ref 19–32)
Calcium: 9.3 mg/dL (ref 8.4–10.5)
Creatinine, Ser: 1.17 mg/dL (ref 0.4–1.2)
GFR calc non Af Amer: 48 mL/min — ABNORMAL LOW (ref >60)
Glucose, Bld: 153 mg/dL — ABNORMAL HIGH (ref 70–99)
Sodium: 140 mEq/L (ref 135–145)

## 2010-10-18 LAB — GLUCOSE, CAPILLARY: Glucose-Capillary: 161 mg/dL — ABNORMAL HIGH (ref 70–99)

## 2010-10-19 LAB — CBC
HCT: 36.1 % (ref 36.0–46.0)
Hemoglobin: 11.9 g/dL — ABNORMAL LOW (ref 12.0–15.0)
MCH: 29.1 pg (ref 26.0–34.0)
MCV: 88.3 fL (ref 78.0–100.0)
Platelets: 229 10*3/uL (ref 150–400)
RBC: 4.09 MIL/uL (ref 3.87–5.11)
WBC: 3.8 10*3/uL — ABNORMAL LOW (ref 4.0–10.5)

## 2010-10-19 LAB — GLUCOSE, CAPILLARY
Glucose-Capillary: 138 mg/dL — ABNORMAL HIGH (ref 70–99)
Glucose-Capillary: 156 mg/dL — ABNORMAL HIGH (ref 70–99)

## 2010-10-19 LAB — BASIC METABOLIC PANEL
BUN: 17 mg/dL (ref 6–23)
CO2: 25 mEq/L (ref 19–32)
Chloride: 106 mEq/L (ref 96–112)
Creatinine, Ser: 1.44 mg/dL — ABNORMAL HIGH (ref 0.4–1.2)
Potassium: 4.7 mEq/L (ref 3.5–5.1)

## 2010-10-19 LAB — PROTIME-INR: Prothrombin Time: 17.5 seconds — ABNORMAL HIGH (ref 11.6–15.2)

## 2010-10-20 LAB — CBC
Hemoglobin: 11.9 g/dL — ABNORMAL LOW (ref 12.0–15.0)
MCV: 87.5 fL (ref 78.0–100.0)
Platelets: 237 10*3/uL (ref 150–400)
RBC: 4.16 MIL/uL (ref 3.87–5.11)
WBC: 4.2 10*3/uL (ref 4.0–10.5)

## 2010-10-20 LAB — GLUCOSE, CAPILLARY
Glucose-Capillary: 169 mg/dL — ABNORMAL HIGH (ref 70–99)
Glucose-Capillary: 186 mg/dL — ABNORMAL HIGH (ref 70–99)
Glucose-Capillary: 115 mg/dL — ABNORMAL HIGH (ref 70–99)

## 2010-10-20 LAB — BASIC METABOLIC PANEL
BUN: 17 mg/dL (ref 6–23)
Chloride: 105 mEq/L (ref 96–112)
Potassium: 5.1 mEq/L (ref 3.5–5.1)

## 2010-10-21 LAB — GLUCOSE, CAPILLARY
Glucose-Capillary: 152 mg/dL — ABNORMAL HIGH (ref 70–99)
Glucose-Capillary: 155 mg/dL — ABNORMAL HIGH (ref 70–99)

## 2010-10-21 LAB — CBC
Hemoglobin: 11.4 g/dL — ABNORMAL LOW (ref 12.0–15.0)
RBC: 4.03 MIL/uL (ref 3.87–5.11)

## 2010-10-21 LAB — BASIC METABOLIC PANEL
GFR calc non Af Amer: 34 mL/min — ABNORMAL LOW (ref >60)
Potassium: 4.3 mEq/L (ref 3.5–5.1)
Sodium: 136 mEq/L (ref 135–145)

## 2010-10-21 LAB — PROTIME-INR
INR: 2.69 — ABNORMAL HIGH (ref 0.00–1.49)
Prothrombin Time: 28.7 seconds — ABNORMAL HIGH (ref 11.6–15.2)

## 2010-10-22 ENCOUNTER — Telehealth: Payer: Self-pay | Admitting: Cardiovascular Disease

## 2010-10-23 NOTE — Assessment & Plan Note (Signed)
Summary: F/U 1-2 WKS PER CHECK OUT WITH PA/SAF   Primary Provider:  [facility]   History of Present Illness: [name] is a 60 yo female with a h/o diabetes, hypertension, hyperlipidemia and asthma who was admitted January 20 with chest pain, syncope and shortness of breath.  She was noted to be in atrial fibrillation with rapid ventricular rate.  Rate control medications were started.  She was placed on warfarin due to her high stroke risk.  Echocardiogram demonstrated an EF of 55-60% and moderate LVH. She eventually converted to normal sinus rhythm.  She returns for followup.  Reveiwed myovue done a few weeks ago and normal with no ischemia.  She is now on insulin for her DM.  BP continues to run hgih.  Will change lisionopril to Hyzaar with addition of a diuretic.  She is not currently have palpitation, mild dyspnea, no recurrent syncope.  Coumadin followed at [facility].    Current Problems (verified): 1)  Coumadin Therapy  (ICD-V58.61) 2)  Nephrolithiasis  (ICD-592.0) 3)  Atrial Fibrillation  (ICD-427.31) 4)  Chest Pain  (ICD-786.50) 5)  Acute Bronchitis  (ICD-466.0) 6)  Hypertension  (ICD-401.9) 7)  Hyperlipidemia  (ICD-272.4) 8)  Diabetes Mellitus, Type II  (ICD-250.00)  Current Medications (verified): 1)  Metformin Hcl 500 Mg Tabs (Metformin Hcl) .... Take 1 Tablet By Mouth Two Times A Day 2)  Nasacort Aq 55 Mcg/act Aers (Triamcinolone Acetonide(Nasal)) .... One Spray Two Times A Day 3)  Cranberry Fruit 405 Mg Caps (Cranberry) .Marland Kitchen.. 1 Tab By Mouth Two Times A Day 4)  Mirtazapine 15 Mg Tabs (Mirtazapine) .Marland Kitchen.. 1 By Mouth At Bedtime 5)  Crestor 10 Mg Tabs (Rosuvastatin Calcium) .Marland Kitchen.. 1 By Mouth At Bedtime 6)  Protonix 40 Mg Tbec (Pantoprazole Sodium) .Marland Kitchen.. 1 By Mouth Daily 7)  Tiazac 120 Mg Xr24h-Cap (Diltiazem Hcl Er Beads) .Marland Kitchen.. 1 By Mouth Daily 8)  Niaspan 500 Mg Cr-Tabs (Niacin (Antihyperlipidemic)) .Marland Kitchen.. 1 By Mouth At Bedtime 9)  Oyster Shell Calcium/d 500-200 Mg-Unit Tabs  (Calcium Carbonate-Vitamin D) .Marland Kitchen.. 1 By Mouth Daily 10)  Vitamin C 500 Mg  Tabs (Ascorbic Acid) .Marland Kitchen.. 1 By Mouth Daily 11)  Tramadol Hcl 50 Mg Tabs (Tramadol Hcl) .... As Needed 12)  Aspirin 81 Mg Tbec (Aspirin) .... Take One Tablet By Mouth Daily 13)  Coumadin 5 Mg Tabs (Warfarin Sodium) .... Uad 14)  Fish Oil   Oil (Fish Oil) .Marland Kitchen.. 1 By Mouth Daily 15)  Novolog 100 Unit/ml Soln (Insulin Aspart) .... Uad 16)  Lantus 100 Unit/ml Soln (Insulin Glargine) .... Uad 17)  Nitrostat 0.4 Mg Subl (Nitroglycerin) .Marland Kitchen.. 1 Tablet Under Tongue At Onset of Chest Pain; You May Repeat Every 5 Minutes For Up To 3 Doses. 18)  Lisinopril 40 Mg Tabs (Lisinopril) .... Take One Tablet By Mouth Daily 19)  Ferrous Sulfate 325 (65 Fe) Mg  Tabs (Ferrous Sulfate) .Marland Kitchen.. 1 Tab By Mouth Once Daily 20)  Hyzaar 100-25 Mg Tabs (Losartan Potassium-Hctz) .... Take One Tablet By Mouth Daily.  Allergies (verified): 1)  ! * Amitriptylline 2)  ! * Dilaudid  Past History:  Past Medical History: Last updated: 09/26/2010 Diabetes mellitus, type II Hyperlipidemia Hypertension Parox. AFib   a. coumadin (CHADS2 = 2) Echo 09/05/10: Ef 55-60%; mod LVH Asthma  Past Surgical History: Last updated: 09/24/2010  PAST SURGICAL HISTORY:   1. Status post closed reduction of left ankle fracture in March 2008.   2. Status post arthroscopic surgery of the left wrist in June 2003.  3. Status post subcutaneous transposition of the left ulnar nerve in       the past.   4. Status post left carpal tunnel release in the past.      Family History: Last updated: 09/24/2010  FAMILY MEDICAL HISTORY:  Her mother deceased at 74 from an MI and she   also had CHF.  Father deceased at 37 from an MI.  She has one brother   who is deceased at 18 from heart failure.   Social History: Last updated: 09/24/2010  She lives with her fiance.  She is an unemployed Lawyer.   She denies tobacco use.  Denies drug use.  Denies EtOH.   Review of Systems        Denies fever, malais, weight loss, blurry vision, decreased visual acuity, cough, sputum, SOB, hemoptysis, pleuritic pain, palpitaitons, heartburn, abdominal pain, melena, lower extremity edema, claudication, or rash.   Vital Signs:  Patient profile:   60 year old female Pulse rate:   88 / minute Pulse rhythm:   regular Resp:     12 per minute BP supine:   155 / 85  Physical Exam  General:  Affect appropriate Healthy:  appears stated age HEENT: normal Neck supple with no adenopathy JVP normal no bruits no thyromegaly Lungs clear with no wheezing and good diaphragmatic motion Heart:  S1/S2 no murmur,rub, gallop or click PMI normal Abdomen: benighn, BS positve, no tenderness, no AAA no bruit.  No HSM or HJR Distal pulses intact with no bruits No edema Neuro non-focal Skin warm and dry    Impression & Recommendations:  Problem # 1:  COUMADIN THERAPY (ICD-V58.61) Last INR 3.2 holding today F/U [facility] next week  Problem # 2:  ATRIAL FIBRILLATION (ICD-427.31) Maint NSR Continue BB and anticoagulation The following medications were removed from the medication list:    Metoprolol Tartrate 25 Mg Tabs (Metoprolol tartrate) .Marland Kitchen... 1/2 tab two times a day x 2 days then stop as per scott weaver, pa-c Her updated medication list for this problem includes:    Aspirin 81 Mg Tbec (Aspirin) .Marland Kitchen... Take one tablet by mouth daily    Coumadin 5 Mg Tabs (Warfarin sodium) ..... Uad  Problem # 3:  CHEST PAIN (ICD-786.50) Atypical with normal myovue Observe for now The following medications were removed from the medication list:    Metoprolol Tartrate 25 Mg Tabs (Metoprolol tartrate) .Marland Kitchen... 1/2 tab two times a day x 2 days then stop as per scott weaver, pa-c    Lisinopril 20 Mg Tabs (Lisinopril) .Marland Kitchen... 1 by mouth daily Her updated medication list for this problem includes:    Tiazac 120 Mg Xr24h-cap (Diltiazem hcl er beads) .Marland Kitchen... 1 by mouth daily    Aspirin 81 Mg Tbec (Aspirin) .Marland Kitchen... Take  one tablet by mouth daily    Coumadin 5 Mg Tabs (Warfarin sodium) ..... Uad    Nitrostat 0.4 Mg Subl (Nitroglycerin) .Marland Kitchen... 1 tablet under tongue at onset of chest pain; you may repeat every 5 minutes for up to 3 doses.    Lisinopril 40 Mg Tabs (Lisinopril) .Marland Kitchen... Take one tablet by mouth daily  Problem # 4:  HYPERTENSION (ICD-401.9) Change lisinopril to Hyzaar and F/U 3 months The following medications were removed from the medication list:    Metoprolol Tartrate 25 Mg Tabs (Metoprolol tartrate) .Marland Kitchen... 1/2 tab two times a day x 2 days then stop as per scott weaver, pa-c    Lisinopril 20 Mg Tabs (Lisinopril) .Marland Kitchen... 1 by mouth daily Her  updated medication list for this problem includes:    Tiazac 120 Mg Xr24h-cap (Diltiazem hcl er beads) .Marland Kitchen... 1 by mouth daily    Aspirin 81 Mg Tbec (Aspirin) .Marland Kitchen... Take one tablet by mouth daily    Lisinopril 40 Mg Tabs (Lisinopril) .Marland Kitchen... Take one tablet by mouth daily    Hyzaar 100-25 Mg Tabs (Losartan potassium-hctz) .Marland Kitchen... Take one tablet by mouth daily.  Problem # 5:  DIABETES MELLITUS, TYPE II (ICD-250.00) Target A1C 6.5 or less  Now on insulin The following medications were removed from the medication list:    Lisinopril 20 Mg Tabs (Lisinopril) .Marland Kitchen... 1 by mouth daily Her updated medication list for this problem includes:    Metformin Hcl 500 Mg Tabs (Metformin hcl) .Marland Kitchen... Take 1 tablet by mouth two times a day    Aspirin 81 Mg Tbec (Aspirin) .Marland Kitchen... Take one tablet by mouth daily    Novolog 100 Unit/ml Soln (Insulin aspart) ..... Uad    Lantus 100 Unit/ml Soln (Insulin glargine) ..... Uad    Lisinopril 40 Mg Tabs (Lisinopril) .Marland Kitchen... Take one tablet by mouth daily    Hyzaar 100-25 Mg Tabs (Losartan potassium-hctz) .Marland Kitchen... Take one tablet by mouth daily.  Patient Instructions: 1)  Your physician recommends that you schedule a follow-up appointment in: 3 months with Dr. [name] 2)  Your physician has recommended you make the following change in your medication:  STOP LISINOPRIL. START HYZAAR 100/25mg  by mouth daily.  Prescriptions: HYZAAR 100-25 MG TABS (LOSARTAN POTASSIUM-HCTZ) take one tablet by mouth daily.  #30 x 6   Entered by:   Whitney Maeola Sarah RN   Authorized by:   Colon Branch, MD, [facility]   Signed by:   Ellender Hose RN on 10/15/2010   Method used:   Faxed to ...       [facility] (retail)       [address] [place], [place]  [address]       Ph: [phone]       Fax: [phone]   RxID:   [id number]

## 2010-10-24 ENCOUNTER — Telehealth: Payer: Self-pay | Admitting: Cardiovascular Disease

## 2010-10-24 NOTE — H&P (Addendum)
[name], [name]               ACCOUNT NO.:  [id number]  MEDICAL RECORD NO.:  [id number]           PATIENT TYPE:  E  LOCATION:  WLED                         FACILITY:  [name]  PHYSICIAN:  Toni Doyne, MD    DATE OF BIRTH:  Feb 22, 1951  DATE OF ADMISSION:  10/16/2010 DATE OF DISCHARGE:                             HISTORY & PHYSICAL   This admission is to [name].  PRIMARY CARE PROVIDER:  [facility].  PRIMARY CARDIOLOGIST:  [name]. Toni Emms, MD, [facility].  CHIEF COMPLAINT:  Palpitations and atrial fibrillation with rapid ventricular response.  HISTORY OF PRESENT ILLNESS:  A 60 year old African-American female with past medical history significant for diabetes, hypertension, hyperlipidemia and atrial fibrillation who presents for evaluation for recurrent atrial fibrillation and rapid ventricular response. Historically, she was diagnosed with atrial fibrillation in January 2012.  She was hospitalized and was initiated on therapy with metoprolol 25 mg b.i.d., diltiazem 120 mg daily and Coumadin.  She had spontaneous conversion to normal sinus rhythm and was discharged.  Since discharge on February 12, her metoprolol was weaned down and was subsequently discontinued because she reports initially her heart rate in the 2s she saw Dr. [facility] [name] on February 29 and at that time, her blood pressure was elevated and because of that he switched her from lisinopril to Hyzaar.  This evening at approximately 6:00 p.m., she developed tachy palpitations and uncomfortableness in her chest, she knew that this was atrial fibrillation and came to the emergency department for evaluation. In the emergency department, she was given IV diltiazem and her current heart rates are in the low 110s and she is still in atrial fibrillation. She reports to me that her INR has been supratherapeutic but has not been subtherapeutic over the past several weeks.  It is monitored by [facility].  PAST  MEDICAL HISTORY: 1. Hypertension. 2. Hyperlipidemia. 3. Diabetes mellitus, type 2. 4. Asthma. 5. Atrial fibrillation (see history present illness for details). 6. Chronic anticoagulation therapy. 7. [place] II score equals 2. 8. Negative Myoview study without evidence of ischemia. 9. Echocardiogram demonstrating EF 55% to 60% with moderate left     ventricular hypertrophy.  SOCIAL HISTORY:  The patient lives in [place].  She denies any tobacco, alcohol or drug use.  She is CNA.  FAMILY HISTORY:  Negative for atrial fibrillation.  ALLERGIES: 1. Dilaudid. 2. Elavil.  MEDICATIONS:  Please see medication reconciliation list.  Minimal medications for her atrial fibrillation include diltiazem 120 mg daily and Coumadin.  REVIEW OF SYSTEMS:  All systems were reviewed and are negative except as mentioned above in the present illness.  PHYSICAL EXAMINATION:  VITAL SIGNS:  Temperature afebrile, blood pressure is 150/80, pulse is 140 on admission, is currently 105, respirations 20, oxygen saturation 100% on room air. GENERAL:  No acute distress. HEENT:  Normocephalic, atraumatic.  Pupils are equally, round and reactive to light and accommodation.  Oropharynx is pink and moist without lesions. NECK:  Supple.  No lymphadenopathy and no jugular venous distention. CARDIOVASCULAR:  Tachycardic, irregularly irregular.  No murmurs. CHEST:  Clear to auscultation bilaterally. ABDOMEN:  Positive bowel sounds, soft, nontender  and nondistended. EXTREMITIES:  No clubbing, cyanosis or edema.  Dorsalis pedis pulses plus bilaterally. SKIN:  No rashes. BACK:  No CVA tenderness. NEUROLOGIC:  No focal deficits. PSYCH:  Normal affect.  PERTINENT LABORATORY DATA:  Show hemoglobin of 12.1, platelets 236. Hypokalemia at 3.5, BUN 13, creatinine 0.4.  Troponin is negative.  Chest x-ray, no acute cardiopulmonary disease.  EKG from 1838 demonstrates atrial fibrillation with rapid ventricular response at  43 beats per minute.  QTC was 480.  IMPRESSION AND PLAN:  [name] is a 60 year old African American female with past medical history significant for paroxysmal atrial fibrillation, hypertension and diabetes who presents for recurrent atrial fibrillation with rapid ventricular response. 1. We will admit the patient to [facility] under Dr. Fabio Bering     care. 2. Atrial fibrillation with rapid ventricular response.  She is     currently on diltiazem drip with an adequately controlled rate at     approximately 105 beats per minute.  I will give her 5 mg of IV     Lopressor to aid with her rate control and we will initiate on     therapy with Lopressor 25 mg b.i.d.  Given the fact that she is     symptomatic with her atrial fibrillation, I think that she will be     a good candidate for rhythm control strategy.  I recommend to her     that she undergo DC cardioversion in the morning.  She reports that     her INRs have all been therapeutic, however, it would be prudent to     check with [facility] to ensure that she does not need TEE prior     to her cardioversion.  I suspect that her recurrent atrial     fibrillation is likely multifactorial and could be related to her     hypokalemia as well as the recent discontinuation of her Lopressor.     We will cycle cardiac enzymes.  We will replace her potassium. 3. Diabetes mellitus.  We will hold her metformin.  We will continue     Lantus and place her on sliding scale insulin.  I will hold her     NovoLog. 4. Hypertension, most recent blood pressure is 120/70.  We will     continue on her home medications as listed above with the addition     of metoprolol for rate control. 5. Hypokalemia.  We will replace the patient's potassium. 6. Fluids, electrolytes, nutrition, saline, IV fluids, n.p.o. after     midnight. 7. Deep venous thrombosis prophylaxis not indicated.  The patient is     on chronic anticoagulation with  Coumadin.     Toni Doyne, MD     SJT/MEDQ  D:  10/16/2010  T:  10/16/2010  Job:  [id number]  Electronically Signed by Aldona Bar MD on 12/15/2010 09:49:42 PM

## 2010-10-24 NOTE — Discharge Summary (Signed)
[name], [name]               ACCOUNT NO.:  [id number]  MEDICAL RECORD NO.:  [id number]           PATIENT TYPE:  I  LOCATION:  2030                         FACILITY:  [facility]  PHYSICIAN:  Noralyn Pick. [name], MD, FACCDATE OF BIRTH:  [birth year]  DATE OF ADMISSION:  10/17/2010 DATE OF DISCHARGE:  10/21/2010                              DISCHARGE SUMMARY   PRIMARY CARDIOLOGIST:  Theron Arista C. [name], MD, [facility]  PRIMARY CARE PROVIDER:  [facility].  DISCHARGE DIAGNOSIS:  Paroxysmal atrial fibrillation with rapid ventricular response.  SECONDARY DIAGNOSES: 1. Hypertension. 2. Hyperlipidemia. 3. Type 2 diabetes mellitus. 4. Asthma. 5. Chronic Coumadin anticoagulation. 6. History of negative Myoview. 7. Stage III chronic kidney disease.  ALLERGIES:  DILAUDID and ELAVIL.  PROCEDURES:  None.  HISTORY OF PRESENT ILLNESS:  This is a 60 year old African American female with the above problem list who was in her usual state of health until approximately 6 p.m. on October 17, 2010, when she developed tachy palpitations and mild chest discomfort which she recognized as being similar to her AFib.  She presented to the [facility] Emergency Department where she was found to be in atrial fibrillation with rapid response and she was treated with intravenous diltiazem with reduction in rate and eventual conversion to sinus rhythm.  The patient was admitted from [facility] to [facility] for further evaluation.  HOSPITAL COURSE:  The patient's cardiac markers remained negative. Given recurrence of atrial fibrillation and significant symptomatic nature of it, decision was made to place the patient on [name] therapy. With her history of chronic kidney disease and creatinine clearance in the 40s, she qualified for 250 mcg q.12 h.  She has tolerated loading with [name] well.  Her QTc has remained stable and this morning was 448 milliseconds.  She has had no recurrence of atrial fibrillation.   Her Coumadin has been managed by Pharmacy and though she did have a subtherapeutic INR for 3 days requiring Lovenox bridging while hospitalized, she has been therapeutic for the past 2 days.  We have worked with Case Management to obtain [name] through the [facility] over the course of the next week and also to enroll the patient in the [name] assistance plan through [employer].  Forms have been filled out.  We will plan to discharge the patient home today in good condition.  DISCHARGE LABORATORY DATA:  Hemoglobin 11.4, hemoglobin 35.1, WBC 3.5, and platelets 217.  INR 2.69.  Sodium 136, potassium 4.3, chloride 104, CO2 of 27, BUN 18, creatinine 1.58, glucose 183, calcium 9.2, magnesium 2.3.  CK 96, MB 1.3, and troponin I less than 0.01.  TSH 2.559.  DISPOSITION:  The patient is being discharged home today in good condition.  FOLLOWUP PLANS AND APPOINTMENTS:  The patient will require followup INR on Friday, October 24, 2010, with [facility].  The patient will follow up with Dr. [name] on December 15, 2010, at 1:45 p.m.  DISCHARGE MEDICATIONS: 1. [name] 250 mcg q.12 h. 2. Losartan 100 mg nightly. 3. Albuterol inhaler 2 puffs b.i.d. 4. Artificial tears 1 drop both eyes p.r.n.. 5. Cranberry pills 405 mg 2 tablets b.i.d. 6.  Crestor 10 mg nightly. 7. Diltiazem CD 120 mg daily. 8. Ferrous sulfate 325 mg daily. 9. Fish oil 1000 mg 2 capsules b.i.d. 10.Lantus 22 units nightly. 11.Metformin 500 mg b.i.d. 12.Mirtazapine 15 mg nightly. 13.Nasacort AQ 1 spray b.i.d. 14.Niaspan 500 mg nightly. 15.Nitroglycerin 0.4 mg sublingual p.r.n. chest pain. 16.NovoLog 6 units subcu t.i.d. 17.Os-Cal plus D 1 tablet daily. 18.Protonix 40 mg daily. 19.Ultram 50 mg 1-2 tablets q.6 h. p.r.n. 20.Vitamin C 500 mg daily. 21.Coumadin 5 mg 3 tablets on Monday and Wednesday and 2 tablets all     other days.  OUTSTANDING LABORATORY DATA AND STUDIES:  Followup INR on Friday, October 24, 2010.  DURATION OF  DISCHARGE ENCOUNTER:  60 minutes including physician time.     Nicolasa Ducking, ANP   ______________________________ Noralyn Pick. [name], MD, [facility]    CB/MEDQ  D:  10/21/2010  T:  10/21/2010  Job:  [id number]  cc:   Melvern Banker  Electronically Signed by Nicolasa Ducking ANP on 10/21/2010 04:25:26 PM Electronically Signed by Charlton Haws MD FACC on 10/23/2010 09:08:30 AM

## 2010-10-28 NOTE — Progress Notes (Signed)
Summary: discuss meds- run out on tues  Phone Note Call from Patient   Caller: Patient [phone] Reason for Call: Talk to Nurse Summary of Call: req samples of tikosyn Initial call taken by: Glynda Jaeger,  October 24, 2010 1:32 PM  Follow-up for Phone Call        [facility] / [facility]-  dorothy timmons [phone]. discuss meds pt runs out of meds on tues pls advise [name]  October 24, 2010 2:34 PM  spoke with  dorothy, able to supply about one week. will place at the front desk desk foer pick up Deliah Goody, RN  October 24, 2010 5:52 PM

## 2010-10-28 NOTE — Progress Notes (Signed)
Summary: question re meds  Phone Note From Other Clinic   Caller: Nurse/dorothy [phone]  [facility] Summary of Call: dorothy has question re pt new meds.  Initial call taken by: Roe Coombs,  October 22, 2010 12:27 PM  Follow-up for Phone Call        Spoke to nurse/Dorothy from [facility].  Wanted to know if we kept samples of [name] for patient-very expensive and patient does not have insurance or income.  Advised her we do not have samples of this med.  She is to check with pharmacy to keep her in med until approved by [employer] for indigent help. Follow-up by: Dessie Coma  LPN,  October 22, 2010 1:36 PM

## 2010-10-30 ENCOUNTER — Other Ambulatory Visit (HOSPITAL_COMMUNITY): Payer: Self-pay | Admitting: Family Medicine

## 2010-10-30 ENCOUNTER — Telehealth: Payer: Self-pay | Admitting: Cardiovascular Disease

## 2010-10-30 DIAGNOSIS — N23 Unspecified renal colic: Secondary | ICD-10-CM

## 2010-10-30 LAB — CBC
HCT: 37.8 % (ref 36.0–46.0)
Hemoglobin: 13 g/dL (ref 12.0–15.0)
MCH: 29.9 pg (ref 26.0–34.0)
MCHC: 34.4 g/dL (ref 30.0–36.0)
MCV: 87.1 fL (ref 78.0–100.0)
Platelets: 206 K/uL (ref 150–400)
RBC: 4.33 MIL/uL (ref 3.87–5.11)
RDW: 14.1 % (ref 11.5–15.5)
WBC: 5.4 K/uL (ref 4.0–10.5)

## 2010-10-30 LAB — BASIC METABOLIC PANEL WITH GFR
BUN: 22 mg/dL (ref 6–23)
Chloride: 101 meq/L (ref 96–112)
GFR calc Af Amer: 50 mL/min — ABNORMAL LOW (ref >60)
GFR calc non Af Amer: 41 mL/min — ABNORMAL LOW (ref >60)
Potassium: 4.3 meq/L (ref 3.5–5.1)

## 2010-10-30 LAB — URINE MICROSCOPIC-ADD ON

## 2010-10-30 LAB — URINALYSIS, ROUTINE W REFLEX MICROSCOPIC
Bilirubin Urine: NEGATIVE
Glucose, UA: NEGATIVE mg/dL
Hgb urine dipstick: NEGATIVE
Ketones, ur: NEGATIVE mg/dL
Nitrite: NEGATIVE
Protein, ur: NEGATIVE mg/dL
Specific Gravity, Urine: 1.027 (ref 1.005–1.030)
Urobilinogen, UA: 0.2 mg/dL (ref 0.0–1.0)
pH: 5.5 (ref 5.0–8.0)

## 2010-10-30 LAB — BASIC METABOLIC PANEL
CO2: 27 mEq/L (ref 19–32)
Calcium: 9.4 mg/dL (ref 8.4–10.5)
Creatinine, Ser: 1.32 mg/dL — ABNORMAL HIGH (ref 0.4–1.2)
Glucose, Bld: 122 mg/dL — ABNORMAL HIGH (ref 70–99)
Sodium: 138 mEq/L (ref 135–145)

## 2010-10-30 LAB — DIFFERENTIAL
Basophils Absolute: 0.1 K/uL (ref 0.0–0.1)
Basophils Relative: 1 % (ref 0–1)
Eosinophils Absolute: 0.1 10*3/uL (ref 0.0–0.7)
Eosinophils Relative: 1 % (ref 0–5)
Lymphocytes Relative: 29 % (ref 12–46)
Lymphs Abs: 1.6 K/uL (ref 0.7–4.0)
Monocytes Absolute: 0.4 K/uL (ref 0.1–1.0)
Monocytes Relative: 8 % (ref 3–12)
Neutro Abs: 3.3 K/uL (ref 1.7–7.7)
Neutrophils Relative %: 60 % (ref 43–77)

## 2010-10-30 LAB — GLUCOSE, CAPILLARY: Glucose-Capillary: 131 mg/dL — ABNORMAL HIGH (ref 70–99)

## 2010-11-04 ENCOUNTER — Ambulatory Visit (HOSPITAL_COMMUNITY)
Admission: RE | Admit: 2010-11-04 | Discharge: 2010-11-04 | Disposition: A | Discharge status: Home / Self Care | Payer: Self-pay | Source: Ambulatory Visit | Attending: Family Medicine | Admitting: Family Medicine

## 2010-11-04 ENCOUNTER — Telehealth: Payer: Self-pay | Admitting: Cardiovascular Disease

## 2010-11-04 ENCOUNTER — Other Ambulatory Visit (HOSPITAL_COMMUNITY): Payer: Self-pay | Admitting: Family Medicine

## 2010-11-04 DIAGNOSIS — N23 Unspecified renal colic: Secondary | ICD-10-CM

## 2010-11-04 DIAGNOSIS — R319 Hematuria, unspecified: Secondary | ICD-10-CM | POA: Insufficient documentation

## 2010-11-04 DIAGNOSIS — Z90710 Acquired absence of both cervix and uterus: Secondary | ICD-10-CM | POA: Insufficient documentation

## 2010-11-04 DIAGNOSIS — R1031 Right lower quadrant pain: Secondary | ICD-10-CM | POA: Insufficient documentation

## 2010-11-04 MED ORDER — IOHEXOL 300 MG/ML  SOLN
80.0000 mL | Freq: Once | INTRAMUSCULAR | Status: AC | PRN
  Administered 2010-11-04: 80 mL via INTRAVENOUS

## 2010-11-04 NOTE — Progress Notes (Signed)
Summary: samples of tikosyn  Phone Note Call from Patient Call back at [facility] Phone [phone]   Caller: Patient Reason for Call: Talk to Nurse Summary of Call: pt needs more samples of tikosyn. pt will run out on tuesday. pt states [facility] states they are not able to fill rx.  Initial call taken by: Roe Coombs,  October 30, 2010 11:01 AM  Follow-up for Phone Call        samples given Deliah Goody, RN  October 30, 2010 5:30 PM

## 2010-11-04 NOTE — Telephone Encounter (Signed)
Spoke with pt, she has enough tikosyn samples until Friday. The Joice Lofts has been ordered and hopefully will be here this week. i told pt we would call her as soon as the meds got here but if she does not hear from [place] by Friday to call me back.

## 2010-11-07 ENCOUNTER — Telehealth: Payer: Self-pay | Admitting: Cardiovascular Disease

## 2010-11-07 ENCOUNTER — Encounter: Payer: Self-pay | Admitting: Cardiovascular Disease

## 2010-11-07 NOTE — Telephone Encounter (Signed)
Phone call with Ronie Spies, PA with Ms. Mcomber.  Ms Dettmann is out of [name] samples.  She is on [name] 250 mcg Q 12 hours.    We called the office and were not able to reach anyone afterhours.  We called the [facility] pharmacy and they were not able to help [place] with the [name].  We tried to call in a script to [facility] [place] and they did not have any [name].  We instructed the patient to call the office on Monday and talk to the nurse.

## 2010-11-10 ENCOUNTER — Telehealth: Payer: Self-pay | Admitting: Cardiovascular Disease

## 2010-11-10 NOTE — Telephone Encounter (Signed)
Spoke with pt, called also and spoke with the [employer]. The information they requested was faxed to their emergency line. Three days worth of samples given to pt.

## 2010-11-10 NOTE — Telephone Encounter (Signed)
See what we can do to get her more samples.  Assistance forms were filled out in hospital

## 2010-11-12 ENCOUNTER — Telehealth: Payer: Self-pay | Admitting: Cardiovascular Disease

## 2010-11-13 ENCOUNTER — Encounter: Payer: Self-pay | Admitting: *Deleted

## 2010-11-13 NOTE — Telephone Encounter (Signed)
Per connection to care the tikosyn should be delivered today by 3pm. Pt aware will call her as soon as delivered.

## 2010-11-14 ENCOUNTER — Emergency Department (HOSPITAL_COMMUNITY)
Admission: EM | Admit: 2010-11-14 | Discharge: 2010-11-14 | Disposition: A | Discharge status: Home / Self Care | Payer: Self-pay | Attending: Emergency Medicine | Admitting: Emergency Medicine

## 2010-11-14 ENCOUNTER — Encounter (HOSPITAL_COMMUNITY): Payer: Self-pay | Admitting: Radiology

## 2010-11-14 ENCOUNTER — Telehealth: Payer: Self-pay | Admitting: Cardiovascular Disease

## 2010-11-14 ENCOUNTER — Emergency Department (HOSPITAL_COMMUNITY): Payer: Self-pay

## 2010-11-14 DIAGNOSIS — R51 Headache: Secondary | ICD-10-CM | POA: Insufficient documentation

## 2010-11-14 DIAGNOSIS — Z794 Long term (current) use of insulin: Secondary | ICD-10-CM | POA: Insufficient documentation

## 2010-11-14 DIAGNOSIS — R079 Chest pain, unspecified: Secondary | ICD-10-CM | POA: Insufficient documentation

## 2010-11-14 DIAGNOSIS — R55 Syncope and collapse: Secondary | ICD-10-CM | POA: Insufficient documentation

## 2010-11-14 DIAGNOSIS — R5383 Other fatigue: Secondary | ICD-10-CM | POA: Insufficient documentation

## 2010-11-14 DIAGNOSIS — E119 Type 2 diabetes mellitus without complications: Secondary | ICD-10-CM | POA: Insufficient documentation

## 2010-11-14 DIAGNOSIS — I10 Essential (primary) hypertension: Secondary | ICD-10-CM | POA: Insufficient documentation

## 2010-11-14 DIAGNOSIS — R5381 Other malaise: Secondary | ICD-10-CM | POA: Insufficient documentation

## 2010-11-14 LAB — BASIC METABOLIC PANEL
BUN: 14 mg/dL (ref 6–23)
Calcium: 9.7 mg/dL (ref 8.4–10.5)
Creatinine, Ser: 1.34 mg/dL — ABNORMAL HIGH (ref 0.4–1.2)
GFR calc non Af Amer: 41 mL/min — ABNORMAL LOW (ref >60)
Glucose, Bld: 192 mg/dL — ABNORMAL HIGH (ref 70–99)
Potassium: 3.7 mEq/L (ref 3.5–5.1)

## 2010-11-14 LAB — PROTIME-INR
INR: 2.67 — ABNORMAL HIGH (ref 0.00–1.49)
Prothrombin Time: 28.5 seconds — ABNORMAL HIGH (ref 11.6–15.2)

## 2010-11-14 LAB — TROPONIN I: Troponin I: 0.01 ng/mL (ref 0.00–0.06)

## 2010-11-14 LAB — CK TOTAL AND CKMB (NOT AT ARMC)
CK, MB: 1.5 ng/mL (ref 0.3–4.0)
Total CK: 188 U/L — ABNORMAL HIGH (ref 7–177)

## 2010-11-14 LAB — DIFFERENTIAL
Basophils Absolute: 0 10*3/uL (ref 0.0–0.1)
Basophils Relative: 0 % (ref 0–1)
Eosinophils Absolute: 0.1 10*3/uL (ref 0.0–0.7)
Eosinophils Relative: 2 % (ref 0–5)
Monocytes Absolute: 0.4 10*3/uL (ref 0.1–1.0)

## 2010-11-14 LAB — HEPATIC FUNCTION PANEL
ALT: 30 U/L (ref 0–35)
AST: 39 U/L — ABNORMAL HIGH (ref 0–37)
Alkaline Phosphatase: 73 U/L (ref 39–117)
Bilirubin, Direct: 0.2 mg/dL (ref 0.0–0.3)
Indirect Bilirubin: 0.5 mg/dL (ref 0.3–0.9)

## 2010-11-14 LAB — CBC
MCHC: 32.4 g/dL (ref 30.0–36.0)
Platelets: 201 10*3/uL (ref 150–400)
RDW: 14.3 % (ref 11.5–15.5)
WBC: 6 10*3/uL (ref 4.0–10.5)

## 2010-11-14 NOTE — Telephone Encounter (Signed)
Spoke with pt who reports she went to ED last night and is feeling better today.  She is out of tikosyn and is wondering what to do.  I checked and there are samples (10 capsules) of tikosyn 250 mcg at desk for her.  I called pt back to give her this information.  Left message to call back.

## 2010-11-14 NOTE — Telephone Encounter (Signed)
Pt went to er for chest pain-was told to call us today-feels some better-resting now

## 2010-11-14 NOTE — Telephone Encounter (Signed)
I confirmed with Rhunette Croft in scheduling that pt called back and asked if samples were at desk. Rhunette Croft told pt they were. Per Rhunette Croft pt had no more questions and did not need to speak with nurse.

## 2010-11-18 ENCOUNTER — Encounter: Payer: Self-pay | Admitting: Cardiovascular Disease

## 2010-11-18 ENCOUNTER — Ambulatory Visit (INDEPENDENT_AMBULATORY_CARE_PROVIDER_SITE_OTHER): Payer: Self-pay | Admitting: Cardiovascular Disease

## 2010-11-18 DIAGNOSIS — E785 Hyperlipidemia, unspecified: Secondary | ICD-10-CM

## 2010-11-18 DIAGNOSIS — I10 Essential (primary) hypertension: Secondary | ICD-10-CM

## 2010-11-18 DIAGNOSIS — I4891 Unspecified atrial fibrillation: Secondary | ICD-10-CM

## 2010-11-18 DIAGNOSIS — E119 Type 2 diabetes mellitus without complications: Secondary | ICD-10-CM

## 2010-11-18 DIAGNOSIS — R079 Chest pain, unspecified: Secondary | ICD-10-CM

## 2010-11-18 NOTE — Assessment & Plan Note (Signed)
F/U [facility]  Target A1c 6.5 or less

## 2010-11-18 NOTE — Assessment & Plan Note (Signed)
Continue current meds.  Prefer to avoid diuretic as she was quite hypokalemic in hospital and this may lead to arrhythmias with [name]

## 2010-11-18 NOTE — Patient Instructions (Signed)
F/U Dr [name] in 3 months F/U coumadin level at [facility]

## 2010-11-18 NOTE — Assessment & Plan Note (Signed)
Maint NSR on [name].  INR;s followed at [facility].  Getting her a supply of [name] has been difficult.  Deliah Goody has been in touch with [employer]

## 2010-11-18 NOTE — Progress Notes (Signed)
[name] is a 60 yo female with a h/o diabetes, hypertension, hyperlipidemia and asthma who was admitted September 05 2009  with chest pain, syncope and shortness of breath.  She was noted to be in atrial fibrillation with rapid ventricular rate.  Rate control medications were started.  She was placed on warfarin due to her high stroke risk.  Echocardiogram demonstrated an EF of 55-60% and moderate LVH. She eventually converted to normal sinus rhythm.  Had normal myovue 2/12.  Subsequently admitted to the hospital with recurrent afib and started on [name].  Patient assistance forms have been filed and she has been getting samples from our office.    ROS: Denies fever, malais, weight loss, blurry vision, decreased visual acuity, cough, sputum, SOB, hemoptysis, pleuritic pain, palpitaitons, heartburn, abdominal pain, melena, lower extremity edema, claudication, or rash.   General: Affect appropriate Healthy:  appears stated age HEENT: normal Neck supple with no adenopathy JVP normal no bruits no thyromegaly Lungs clear with no wheezing and good diaphragmatic motion Heart:  S1/S2 no murmur,rub, gallop or click PMI normal Abdomen: benighn, BS positve, no tenderness, no AAA no bruit.  No HSM or HJR Distal pulses intact with no bruits No edema Neuro non-focal Skin warm and dry No muscular weakness   Current Outpatient Prescriptions  Medication Sig Dispense Refill  . Ascorbic Acid (VITAMIN C) 500 MG tablet Take 500 mg by mouth daily.        . calcium-vitamin D (OSCAL WITH D) 500-200 MG-UNIT per tablet Take 1 tablet by mouth daily.        . Cranberry 405 MG CAPS Take by mouth. 1 po bid       . diltiazem (TIAZAC) 120 MG 24 hr capsule Take 120 mg by mouth daily.        . ferrous sulfate 325 (65 FE) MG EC tablet Take 325 mg by mouth daily with breakfast.        . insulin aspart (NOVOLOG) 100 UNIT/ML injection 6 units tid      . insulin glargine (LANTUS) 100 UNIT/ML injection Inject into the  skin. 22 units daily      . losartan (COZAAR) 100 MG tablet Take 100 mg by mouth daily.        . metFORMIN (GLUMETZA) 500 MG (MOD) 24 hr tablet Take 500 mg by mouth 2 (two) times daily with a meal.        . mirtazapine (REMERON) 15 MG tablet Take 15 mg by mouth at bedtime.        . niacin (NIASPAN) 500 MG CR tablet Take 500 mg by mouth at bedtime.        . nitroGLYCERIN (NITROSTAT) 0.4 MG SL tablet Place 0.4 mg under the tongue every 5 (five) minutes as needed.        . Omega-3 Fatty Acids (FISH OIL) 1000 MG CAPS 1 po bid      . pantoprazole (PROTONIX) 40 MG tablet Take 40 mg by mouth daily.        . rosuvastatin (CRESTOR) 10 MG tablet Take 10 mg by mouth daily.        . traMADol (ULTRAM) 50 MG tablet Take 50 mg by mouth every 6 (six) hours as needed.        . triamcinolone (NASACORT) 55 MCG/ACT nasal inhaler 2 sprays by Nasal route daily.        Marland Kitchen warfarin (COUMADIN) 5 MG tablet 5 mg. Use as directed       .  DISCONTD: aspirin 81 MG tablet Take 81 mg by mouth daily.        Marland Kitchen DISCONTD: lisinopril (PRINIVIL,ZESTRIL) 40 MG tablet Take 40 mg by mouth daily.        Marland Kitchen DISCONTD: losartan-hydrochlorothiazide (HYZAAR) 100-25 MG per tablet Take 1 tablet by mouth daily.          Allergies  Hydromorphone hcl  Electrocardiogram:  11/14/10  SR 85  LAE QRS 80 QT 386/459  Assessment and Plan

## 2010-11-23 LAB — URINALYSIS, ROUTINE W REFLEX MICROSCOPIC
Bilirubin Urine: NEGATIVE
Glucose, UA: NEGATIVE mg/dL
Ketones, ur: NEGATIVE mg/dL
Nitrite: NEGATIVE
Protein, ur: NEGATIVE mg/dL
pH: 6 (ref 5.0–8.0)

## 2010-11-23 LAB — WET PREP, GENITAL
Clue Cells Wet Prep HPF POC: NONE SEEN
Yeast Wet Prep HPF POC: NONE SEEN

## 2010-11-23 LAB — URINE CULTURE: Colony Count: 7000

## 2010-11-23 LAB — POCT I-STAT, CHEM 8
Glucose, Bld: 68 mg/dL — ABNORMAL LOW (ref 70–99)
HCT: 32 % — ABNORMAL LOW (ref 36.0–46.0)
Hemoglobin: 10.9 g/dL — ABNORMAL LOW (ref 12.0–15.0)
Potassium: 3.5 mEq/L (ref 3.5–5.1)
Sodium: 139 mEq/L (ref 135–145)

## 2010-11-23 LAB — CBC
MCHC: 33.9 g/dL (ref 30.0–36.0)
MCV: 88.6 fL (ref 78.0–100.0)
Platelets: 210 10*3/uL (ref 150–400)
RDW: 13.9 % (ref 11.5–15.5)

## 2010-11-23 LAB — DIFFERENTIAL
Basophils Absolute: 0 10*3/uL (ref 0.0–0.1)
Basophils Relative: 1 % (ref 0–1)
Eosinophils Absolute: 0.1 10*3/uL (ref 0.0–0.7)
Neutrophils Relative %: 68 % (ref 43–77)

## 2010-11-23 LAB — GC/CHLAMYDIA PROBE AMP, GENITAL: GC Probe Amp, Genital: NEGATIVE

## 2010-11-23 LAB — PREGNANCY, URINE: Preg Test, Ur: NEGATIVE

## 2010-12-03 ENCOUNTER — Telehealth: Payer: Self-pay | Admitting: Cardiovascular Disease

## 2010-12-03 NOTE — Telephone Encounter (Signed)
No should be taking it bid

## 2010-12-03 NOTE — Telephone Encounter (Signed)
Spoke with pt, she reports that she has been having some episodes of elevated heart rate. She states it only last a few minutes and then goes back down. She reports that the last time she saw dr [name] she was told to take her tikosyn 250mg  just once daily and had been taking it twice and wonders if that is why she is having the break through. Will forward for dr [name] review [name]

## 2010-12-03 NOTE — Telephone Encounter (Signed)
Spoke with pt, she is aware to take tikosyn twice daily. She will call with further problems [name]

## 2010-12-30 NOTE — H&P (Signed)
NAMEMERLYN, BOLLEN               ACCOUNT NO.:  [id number]   MEDICAL RECORD NO.:  [id number]          PATIENT TYPE:  OBV   LOCATION:  0111                         FACILITY:  [facility]   PHYSICIAN:  Elliot Cousin, M.D.    DATE OF BIRTH:  1951-01-06   DATE OF ADMISSION:  09/28/2007  DATE OF DISCHARGE:                              HISTORY & PHYSICAL   PRIMARY CARE PHYSICIAN:  [facility].   CHIEF COMPLAINT:  Chest pain, dizziness, sweatiness.   HISTORY OF PRESENT ILLNESS:  The patient is a 60 year old woman with a  recent diagnosis of hypertension and type 2 diabetes mellitus, who  presents to the emergency department with a chief complaint of left-  sided chest pain, nausea, shortness of breath, dizziness, and  sweatiness.  The patient actually presented to the emergency department  at [facility] on September 26, 2007 for similar complaints.  Cardiac markers were ordered and were negative at that time.  The  patient apparently was found to be hypertensive and hyperglycemic.  The  patient has a remote history of hypertension treated with HCTZ  transiently in the past.  However, she was unaware of her diabetes  diagnosis.  The patient was given a prescription for hydrochlorothiazide  and Glucophage.  She filled the prescription, yesterday, and began  taking them.  Over the past 24 hours, she has had recurrent sweatiness,  nausea, shortness of breath, and a start of left-sided chest pain.  The  chest pain started today at 12:30 p.m. while she was riding in a car.  She described the pain as pressure and heaviness.  The pain was  moderate in intensity and it has been intermittent.  The pain radiated  to the left shoulder.  She had an episode of diaphoresis and  anxiousness.  She has also had a headache.  She also has had blurred  vision. She has also had dizziness described as swimmy headedness.  The patient admits to increased stress at home and tends to be somewhat  anxious.  She is unsure if she is going through menopause.  During the evaluation in the emergency department, the patient is noted  to be hemodynamically stable.  Her EKG reveals normal sinus rhythm  without any acute findings.  Her chest x-ray reveals a stable, bilateral  hilar prominence (for nearly 6 years).  Her initial cardiac markers are  negative.  The patient, however, will be admitted for further evaluation  and management.   PAST MEDICAL HISTORY:  1. Type 2 diabetes mellitus, diagnosed September 26, 2007, in the [facility] Emergency Department.  2. Hypertension with a remote history of being treated with      hydrochlorothiazide in the 1990s.  3. History of depression and anxiety.  4. Status post closed reduction of a left ankle fracture in March      2008.  5. Pelvic pain secondary to adhesions, status post lysis of adhesions,      status post hysterectomy, and status post bilateral oophorectomy in      June and August of 2003.  6. Status post arthroscopic surgery of the left wrist in June 2003.  7. Status post subcutaneous transposition of the left ulnar nerve in      the past.  8. Status post left carpal tunnel release in the past.   MEDICATIONS:  1. Ibuprofen 400 mg nightly p.r.n.  2. Metformin 500 mg daily.  3. Hydrochlorothiazide 25 mg daily.   ALLERGIES:  The patient has an allergy to AMITRIPTYLINE.   SOCIAL HISTORY:  The patient is single.  She has 3 children.  She is  employed as a Lawyer.  She denies tobacco, alcohol, and drug use.   FAMILY HISTORY:  Her mother died of congestive heart failure at 33 years  of age.  Her father died of a heart attack at 91 years of age.  One  brother died of congestive heart failure at 59 years of age. She had a 61-  month-old baby who dies of SIDS.   PHYSICAL EXAMINATION:  VITAL SIGNS:  Temperature 97.6, blood pressure  137/82, pulse 74, respiratory rate 20, oxygen saturation 99% on 2 liters  of nasal cannula oxygen.   GENERAL:  The patient is a 60 year old obese African-American female who  is currently lying in bed in no acute distress.  HEENT:  Head normocephalic, atraumatic.  Pupils are equal, round, and  reacted to light. Extraocular movements are intact.  Conjunctivae are  clear.  Sclerae are white.  Oropharynx reveals good dentition.  The  mucous membranes are mildly dry.  No posterior exudates or erythema.  NECK:  Supple.  No adenopathy, no thyromegaly, no bruits, no JVD.  LUNGS:  Clear to auscultation bilaterally.  HEART:  S1, S2 with no murmurs, rubs, or gallops.  ABDOMEN:  Obese.  Positive bowel sounds, soft mildly tender in the  epigastrium without any hepatosplenomegaly, distention, or masses  palpated.  GENITOURINARY AND RECTAL:  Deferred.  EXTREMITIES:  Pedal pulses are palpable bilaterally.  No pretibial edema  and no pedal edema.  MUSCULOSKELETAL:  The patient does have some mild tenderness over the  left pectoris major muscle and shoulder upon flexion, extension, and  internal and external rotation of the left upper extremity.  No acute  hot joint and no erythema or edema appreciated.  NEUROLOGIC:  The patient is alert and oriented x3.  Cranial nerves II-  XII are intact.  She has a 5/5 throughout.  Sensation is intact.  PSYCHOLOGICAL:  The patient is somewhat anxious.   ADMISSION LABORATORIES:  EKG normal sinus rhythm with a heart rate of 80  beats per minute, and no acute T wave or ST changes.  Chest x-ray  results are above.  D-dimer 0.24, CK/MB 3.4, troponin I less than 0.05,  myoglobin 164.  Sodium 138, potassium 3.7, chloride 103, CO2 23, glucose  169, BUN 14, creatinine 1.24  Total bilirubin 0.9, alkaline phosphatase  91, SGOT 36, SGPT 28, total protein 8.3, albumin 4.2, calcium 10.1.  WBC  4.6, hemoglobin 14.1, platelets 200.   ASSESSMENT:  1. Atypical chest pain.  The patient certainly has risk factors for      cardiac/coronary artery disease, however, her EKG is  completely      normal and her cardiac markers are within normal limits.  2. Subjective dizziness, diaphoresis, and nausea.  The patient also      has objective shortness of breath, although her breathing is      nonlabored on exam.  This symptomatology may simply be a      manifestation  of acute on chronic anxiety.  She is currently      neurologically intact.  3. Newly diagnosed type 2 diabetes mellitus.  The patient was started      on treatment with Metformin on September 26, 2007.  4. Hypertension.  The patient's blood pressure is within normal      limits, currently.  She was started on treatment with      hydrochlorothiazide on September 26, 2007.  5. Mild renal insufficiency with a creatinine of 1.25.   PLAN:  1. The patient will be admitted for further evaluation and management.  2. We will check cardiac enzymes, a followup EKG in the morning, and a      2-D echocardiogram for further evaluation.  3. Will also assess her thyroid function with a TSH.  Will check a      hemoglobin A1c, and a fasting lipid panel.  4. Symptomatic treatment with as needed morphine, as needed Xanax, and      as needed oxycodone.  5. We will start an aspirin 81 mg daily.  6. Will start prophylactic Protonix and Lovenox. We will add Mylanta      t.i.d. x24 hours and then p.r.n. thereafter.  7. Diabetes teaching. We will continue Glucophage for now.  8. HOLD the hydrochlorothiazide and gently volume replete with normal      saline and reassess the patient's renal functions in the morning.      Elliot Cousin, M.D.  Electronically Signed     DF/MEDQ  D:  09/28/2007  T:  09/28/2007  Job:  [id number]

## 2011-01-02 NOTE — Discharge Summary (Signed)
   NAME:  [name], [name]                         ACCOUNT NO.:  [id number]   MEDICAL RECORD NO.:  [id number]                   PATIENT TYPE:  INP   LOCATION:  9305                                 FACILITY:  [facility]   PHYSICIAN:  Phil D. Okey Dupre, M.D.                  DATE OF BIRTH:  12-29-1950   DATE OF ADMISSION:  04/11/2002  DATE OF DISCHARGE:  04/14/2002                                 DISCHARGE SUMMARY   HISTORY OF PRESENT ILLNESS:  The patient is a 60 year old black female with  chronic pelvic pain.  Underwent bilateral salpingo-oophorectomy and lysis of  pelvic adhesions on the day of admission.  The patient had had a previous  abdominal hysterectomy in the past.  She has done extremely well  postoperatively with the exception of some difficulty with pain control.  The patient tends to not tolerate pain very well.  On the day of discharge  after a bowel movement the previous day has seemed to do well.  Will  discharge her on Percocet and Motrin.  I do not have the pathology back at  the time of discharge.  The patient has a hemoglobin of 10.3 and hematocrit  of 30.5 at discharge with normal white count.  All other laboratories are  generally within normal limits.   DISCHARGE DIAGNOSES:  Pelvic adhesions with ovaries and tubes plastered  against pelvic wall and cul-de-sac.                                               Phil D. Okey Dupre, M.D.    PDR/MEDQ  D:  04/14/2002  T:  04/14/2002  Job:  [id number]

## 2011-01-02 NOTE — Discharge Summary (Signed)
[name], [name]               ACCOUNT NO.:  [id number]   MEDICAL RECORD NO.:  [id number]          PATIENT TYPE:  OBV   LOCATION:  1428                         [facility]ACILITY:  [facility]   PHYSICIAN:  Elliot Cousin, M.D.    DATE OF BIRTH:  1951-02-05   DATE OF ADMISSION:  09/28/2007  DATE OF DISCHARGE:  09/29/2007                               DISCHARGE SUMMARY   DISCHARGE DIAGNOSES:  1. Atypical chest pain.  Myocardial infarction ruled out.  2-D      echocardiogram is pending.  2. Recently diagnosed type 2 diabetes mellitus.  Hemoglobin A1c was      12.1 during the hospital course.  3. Hypertension.  4. Acute renal insufficiency, resolved with IV fluids.  5. Hyperlipidemia.  6. Generalized anxiety.   DISCHARGE MEDICATIONS:  1. Metformin 500 mg daily.  2. Hydrochlorothiazide 25 mg half a tablet daily.  3. Potassium chloride 20 mEq half a tablet daily.  4. Pravastatin 20 mg nightly.  5. Aspirin 81 mg daily.  6. Pepcid 10 mg 2 tablets daily.   DISCHARGE DISPOSITION:  The patient was discharged to home in improved  and stable condition on September 29, 2007.  She was advised to follow up  with the [facility] Emergency [facility] in 1-2 weeks.   PROCEDURES PERFORMED:  1. A 2-D echocardiogram performed on September 29, 2007.  Results      pending at the time of hospital discharge.  2. Chest x-ray on September 28, 2007.  The results revealed stable      bilateral hilar prominence for nearly 6 years, likely vascular.  No      active disease.   HISTORY OF PRESENT ILLNESS:  The patient is a 60 year old woman with a  recent diagnosis of hypertension and type 2 diabetes mellitus, who  presented to the emergency department on September 28, 2007, with a chief  complaint of left-sided chest pain.  The pain was associated with  nausea, shortness of breath, dizziness, and sweatiness.  The patient  also admitted that she had been under a lot of stress lately.  Several  days ago, she had been started on  Glucophage for newly diagnosed  diabetes mellitus during an evaluation in the emergency department on  September 26, 2007.  She was advised to follow up with the [facility], however, they could not fit her in earlier today.  She therefore  presented to the emergency department today with not only chest pain but  a follow-up of her diabetes.   For additional details, please see the dictated history and physical.   HOSPITAL COURSE:  PROBLEM #1 -  CHEST PAIN:  At the time of the initial  hospital assessment, the patient was noted to be normotensive and  hemodynamically stable.  Her EKG revealed normal sinus rhythm with no  acute T-wave or ST wave abnormalities..  Her chest x-ray revealed no  acute disease.  Her D-dimer was within normal limits.  Her initial  cardiac markers were negative.  For further evaluation, cardiac enzymes  were ordered,  well as a TSH, homocystine , and a fasting lipid  panel.  Her cardiac enzymes were negative.  Her TSH was within normal limits at  1.34.  Her homocysteine level was within normal limits at 9.9.  Her  fasting lipid panel revealed a total cholesterol of 20, triglycerides of  648, and HDL of 28.  The LDL was not calculated secondary to the  hypertriglyceridemia.  The patient was started on pravastatin 20 mg  nightly. She was started on aspirin earlier.  For symptomatic relief,  the patient was prescribed as-needed morphine, as-needed Tylenol, and as-  needed oxycodone.  Xanax was added empirically for anxiety.  Prophylactic Lovenox and Protonix were started as well.  Over the course  of the hospitalization, the patient became completely chest pain-free.  For an additional assessment, however, a 2-D echocardiogram was ordered.  The results are pending.  The patient was advised to call the dictating  physician for the results in a few days.  I informed her that if there  were any untoward results or serious abnormalities, she would be called  as  well.  The patient voiced understanding.   PROBLEM #2 -  RECENTLY DIAGNOSED HYPERTENSION:  The patient was started  on hydrochlorothiazide on September 26, 2007, by the emergency department  physician.  The hydrochlorothiazide was temporarily withheld during the  hospital course because of acute renal insufficiency.  Her blood  pressure remained within normal limits during the hospital course.  She  was discharged home on half of a tablet of hydrochlorothiazide daily.   PROBLEM #3 -  RECENTLY DIAGNOSED TYPE 2 DIABETES MELLITUS:  The patient  was started on metformin September 26, 2007, by the emergency department  physician.  During this hospitalization,  sliding scale NovoLog was  added for additional management.  Her hemoglobin A1c was measured and  found to be elevated at 12.1.  The registered dietician was consulted  and provided the patient with nutrition education along with diabetes  management recommendations.  The patient was receptive and voiced  understanding.  She was advised to continue metformin daily.  She was  also informed that with weight loss and a proper diet, the metformin  could possibly be eliminated in the future.  However, on the other hand,  if her capillary blood glucose is not significantly better in the next 3-  4 weeks, it is possible that the dose of metformin would need to be  increased.  The patient was strongly advised to follow up with her  physician at the [facility].   PROBLEM #4 -  HYPERLIPIDEMIA:  The results of the patient's fasting  lipid panel are above.  The hypertriglyceridemia is probably the  consequence of uncontrolled diabetes.  Nevertheless, the patient was  started on pravastatin.   PROBLEM #5 -  ACUTE RENAL INSUFFICIENCY:  The patient's creatinine was  mildly elevated at 1.25 during the initial assessment.  The  hydrochlorothiazide was withheld and she was started on gentle IV  fluids.  At the time of hospital discharge, her  creatinine improved to  1.16.  The dose of the hydrochlorothiazide was decreased to 12.5 mg  daily.      Elliot Cousin, M.D.  Electronically Signed     DF/MEDQ  D:  10/04/2007  T:  10/04/2007  Job:  [id number]   cc:   Melvern Banker  Fax: [phone]

## 2011-01-02 NOTE — Consult Note (Signed)
   NAME:  [name], [name]                         ACCOUNT NO.:  [id number]   MEDICAL RECORD NO.:  [id number]                   PATIENT TYPE:  OUT   LOCATION:  OPC                                  FACILITY:  [facility]   PHYSICIAN:  Phil D. Okey Dupre, M.D.                  DATE OF BIRTH:  11-12-1950   DATE OF CONSULTATION:  DATE OF DISCHARGE:                                   CONSULTATION   HISTORY:  The patient is a 60 year old black female who was referred from  her internist because she was complaining of lower extremity swelling and  abdominal pain, especially in the right lower quadrant.  The patient had  undergone a hysterectomy some time ago and in July 2003, underwent bilateral  salpingo-oophorectomy for chronic pelvic pain, also lysis of adhesions.  The  pelvis had massive adhesions which is probably what is responsible for her  pain.   PHYSICAL EXAMINATION:  ABDOMEN:  Soft, flat, nontender, no masses, no  organomegaly palpated with good bowel sounds.  EXTREMITIES:  The patient has lower extremities in spite of complaining that  they are swelling, they are very slender and her tibia is very easily  palpable.   In further questioning the patient, she has recurrent diarrhea alternating  with constipation.  She had a Hemoccult which was negative done by her  internist.  My impression is her pain is secondary to pelvic adhesions,  which I told her she will probably end up having to live with, but at her  age it would appropriate to rule out some other causes of pain, especially  since she has had a recent and chronic change in her bowel habits with  alternating diarrhea and constipation.   IMPRESSION:  1. Pelvic pain secondary to pelvic adhesions.  2. Normal lower extremities.  3. Recurrent diarrhea and constipation probably secondary to spastic colon     or irritable bowel disease.   PLAN:  Refer to gastroenterologist for colonoscopy.     Phil D. Okey Dupre, M.D.    PDR/MEDQ  D:  03/15/2003  T:  03/15/2003  Job:  [id number]

## 2011-01-02 NOTE — Op Note (Signed)
NAME:  [name], [name]                         ACCOUNT NO.:  [id number]   MEDICAL RECORD NO.:  [id number]                   PATIENT TYPE:  AMB   LOCATION:  SDC                                  FACILITY:  [facility]   PHYSICIAN:  Clement Husbands, M.D.         DATE OF BIRTH:  01/19/1951   DATE OF PROCEDURE:  04/11/2002  DATE OF DISCHARGE:                                 OPERATIVE REPORT   PREOPERATIVE DIAGNOSES:  1. Pelvic pain.  2. Status post hysterectomy.   POSTOPERATIVE DIAGNOSES:  1. Pelvic pain.  2. Status post hysterectomy.  3. Pelvic adhesions.   OPERATION:  1. Diagnostic laparoscopy.  2. Laparotomy with bilateral salpingo-oophorectomy and division of     adhesions.   SURGEON:  Burnadette Peter, M.D.  Phil D. Okey Dupre, M.D.   ANESTHESIA:  General.   PROCEDURE:  With patient under satisfactory general anesthesia in the  modified dorsal lithotomy position the abdomen was prepped. A Foley catheter  was inserted into the bladder. A sponge stick with sponge attached was  placed in the vagina.  The abdomen was draped.  Small infraumbilical  transverse skin incision was made and a Veress needle introduced into the  abdominal cavity. CO2 was insufflated at the rate of a liter per minute  until approximately 2.5 liters yielded adequate abdominal distension.  Veress needle was removed.  The atraumatic laparoscopic trocar was  introduced.  The diagnostic laparoscope was then placed and normal placement  was noted.  Grasping forceps were placed down the operating channel.  The  sponge stick in the vagina was pushed in which elevated the cul-de-sac.  The  right fallopian tube was noted to be adherent across the vaginal cuff.  Underneath the right tube was a small ovary.  The fallopian tube was densely  adherent.  The left fallopian tube appeared to be normal. However,  underneath and hanging down into the cul-de-sac was a cystic ovary that  probably measured 4+ cm in size.  The  adhesions of these structures explain  her pain.   The laparoscope was removed.  The trocar was left in place at this point.  A  low abdominal transverse skin incision was made and with sharp dissection  carried down the rectus fascia and then was sharply transversely divided.  The peritoneal cavity was entered and the excess CO2 gas immediately  escaped.  The previously positioned laparoscopic trocar was removed.  Self-  retaining Terressa Koyanagi was positioned.  There was a band of adhesion to segment  of bowel which was divided. Small and large bowel was packed out of the  pelvis.  Palpation revealed this ovarian cyst that probably measured 5+ cm  in size was adherent to the left pelvic sidewall.  Therefore we started on  the right side.   Right ureter was identified in its course.  The lateral peritoneum was  incised.  The right ovarian vessels were clamped, divided  and were doubly  ligated with 0 Vicryl sutures.  The round ligament was clamped, divided and  suture ligated.  By means of sharp and blunt dissection the fallopian tube  and ovary were taken down going towards the vaginal cuff and the tube was  dissected off the apex of the vaginal cuff area.  Occasional little bleeding  site required cauterization.   Attention was now turned to the left side. The left ureter was identified in  its course.  The lateral peritoneum was incised and the left ovarian vessels  were clamped, divided and were doubly ligated with 0 Vicryl sutures.  The  left round ligament was clamped, divided and suture ligated.  It was felt  that the left ovary could be lifted up but in the process of doing this the  cyst ruptured.  Fluid was serous.  Some of it was aspirated and sent  separately to pathology.  By means of sharp and blunt dissection the  peritoneum was taken down.  Some denser areas had to be clamped, divided and  ligated.  Continual attention was paid to the course of the left ureter  which went  under the posterior portion of this stuck down ovary.  Sharp and  blunt dissection gradually freed up the ovary, and the tube and ovary were  removed.  The bed where this ovary had been laying was extremely vascular.  With some care a lot of the area was lightly coagulated. Pressure was  applied which only moderately helped. The course of the ureter deep in this  area could not be definitively seen.  It was palpated and thought to be  deeper than where we were working.  Pressure was applied for about four  minutes which seemed to help some. Because of continued bleeding, a 3-0  Vicryl suture on a small needle was used to pull edges of peritoneum  together with a figure-of-eight suture.  This helped to tamponade the  bleeding area. A couple of additional sutures a little bit higher up on this  side were placed to incorporate the peritoneum and bleeding areas so as to  tamponade it.  The pelvis was irrigated.  There was one little oozing site  deep in the left pelvic sidewall.  This was lightly cauterized.  It  continued to ooze every so slightly so numerous small pieces of Gelfoam were  packed in the area and held with pressure for a few minutes and this seemed  to provide good hemostasis.  The entire pelvis was inspected and hemostasis  was fine.   Previously positioned packs and self-retaining B[place] were removed.  Small  and large bowel were allowed to fall back into the pelvis.  The anterior  peritoneum was closed with a running 0 Vicryl suture.  Recti muscles  approximated in the midline with 0 Vicryl suture.  The rectus fascia closed  with two segments of running 0 Vicryl suture. Subcutaneous tissue was  approximated in the midline with interrupted Vicryl suture.  Skin edges were  approximated with Y skin staples. Two Y staples were also placed in the  infraumbilical laparoscopy incision.  Dry, sterile dressings were placed. Estimated blood loss was 400 to 500 cc. Sponge and needle  count was correct  and the urine was clear.  She tolerated the procedure well and returned to  the recovery room in satisfactory condition.  Clement Husbands, M.D.    EFR/MEDQ  D:  04/11/2002  T:  04/11/2002  Job:  [id number]

## 2011-01-02 NOTE — Op Note (Signed)
NAMEALEXANDR, [name]               ACCOUNT NO.:  [id number]   MEDICAL RECORD NO.:  [id number]          PATIENT TYPE:  AMB   LOCATION:  DSC                          FACILITY:  [facility]   PHYSICIAN:  Loreta Ave, M.D. DATE OF BIRTH:  Sep 25, 1950   DATE OF PROCEDURE:  10/27/2006  DATE OF DISCHARGE:  10/28/2006                               OPERATIVE REPORT   PREOPERATIVE DIAGNOSES:  1. Trimalleolar ankle fracture, left.  2. Status post closed reduction of dislocation, left ankle.   POSTOPERATIVE DIAGNOSES:  1. Trimalleolar ankle fracture, left.  2. Status post closed reduction of dislocation, left ankle.   OPERATIVE PROCEDURE:  Open reduction and internal fixation of medial and  lateral malleolar portions of trimalleolar fracture with closed  reduction of posterior small fragment.  Two 4.0 cannulated screws  medially and a one 32-mm plate and screws and interfragmentary screw  laterally.   SURGEON:  Loreta Ave, M.D.   ASSISTANT:  Genene Churn. Denton Meek.   ANESTHESIA:  General.   BLOOD LOSS:  Minimal.   TOURNIQUET TIME:  One hour.   SPECIMENS:  None.   CULTURES:  None.   COMPLICATIONS:  None.   DRESSING:  Soft compressive with short-leg splint.   PROCEDURE:  Patient brought to the operating room and after adequate  anesthesia had been obtained, the previous splint removed.  Skin in good  condition and swelling reduced.  Tourniquet applied upper aspect of leg.  Prepped and draped in the usual sterile fashion.  Exsanguinated with  elevation of Esmarch.  Tourniquet inflated 300 mmHg.  A slightly curved  longitudinal incision over the medial malleolus.  Skin and subcutaneous  tissue divided.  Fracture exposed.  Relatively a small piece of medial  malleolus, but big enough to put 2 screws in.  After cleaning out the  joint and fibrous debris and hematoma, fracture reduced anatomically.  Template held with 2 guidewires with good alignment confirmed  fluoroscopically.   Predrilled and then firmly fixed with two 4.0 lag  screws.  Guidewires removed.  Attention turned laterally.  A  longitudinal approach to the spiral fracture of the fibula.  Syndesmosis  intact.  Fracture reduced anatomically, held in place with clamps.  Bridged with a 6-hole plate.  Through the center portion of the spiral,  a screw is placed from front-to-back placed to produce lag-effect.  At  completion, a solid stable fixation throughout.  Care taken not to enter  the syndesmosis of the joint with screws.  Overall alignment was  excellent.  The posterior fracture aligned and not big enough to require  further fixation.  Under fluoroscopy, I could bring her through good  motion and things were solid, but I am still concerned about a small-  size medial fragment.  I was very pleased with alignment and stability.  Wounds irrigated and closed with Vicryl and then staples.  We injected  Marcaine.  A short-leg splint and a sterile compressive dressing  applied.  Tourniquet deflated and removed.  Anesthesia reversed.  Brought to recovery room.  Tolerated surgery well with no complications.  Loreta Ave, M.D.  Electronically Signed     DFM/MEDQ  D:  10/28/2006  T:  10/29/2006  Job:  [id number]

## 2011-01-02 NOTE — Op Note (Signed)
[place]. [facility]  Patient:    [name], [name] Visit Number: [id number] MRN: [id number]          Service Type: DSU Location: [facility] Attending Physician:  Ronne Binning Dictated by:   Nicki Reaper, M.D. Proc. Date: 10/11/01 Admit Date:  10/11/2001                             Operative Report  PREOPERATIVE DIAGNOSIS:  Tardy ulnar nerve palsy, left elbow.  POSTOPERATIVE DIAGNOSIS:  Tardy ulnar nerve palsy, left elbow.  OPERATION:  Subcutaneous transposition, left ulnar nerve.  SURGEON:  Nicki Reaper, M.D.  ASSISTANT:  Joaquin Courts, R.N.  ANESTHESIA:  General.  DATE OF OPERATION:  October 11, 2001  ANESTHESIOLOGIST:  Judie Petit, M.D.  HISTORY:  The patient is a 60 year old female with a history of tardy ulnar nerve palsy, EMG nerve conduction is positive, which has not responded to conservative treatment.  DESCRIPTION:  The patient was brought to the operating room where a general anesthetic was carried out without difficulty.  She was prepped and draped using DuraPrep.  A tourniquet placed high on the arm was inflated to 250 mmHg after exsanguination of the limb with an Esmarch bandage.  A curvilinear incision was made over the medial aspect of her left elbow, carried down through subcutaneous tissue, bleeders were electrocauterized.  The antebrachial cutaneous nerve of the forearm was identified and protected, the dissection carried down to the ulnar nerve.  The nerve was then released, a fasciotomy performed to the flexor carpi ulnaris.  The medial intermuscular septum was then isolated and detached from the humerus and left attached to the epicondyle distally.  Bleeders were electrocauterized.  This was performed from the arcade of Struthers distal to the epicondyle.  The nerve was then anteriorly transposed along with the vessels.  Two slings were created using the medial intermuscular septum and a portion of the fascia of the  flexor carpi ulnaris was then elevated, left attached to the epicondyle distally, and two slings were fabricated.  These were used to maintain the nerve anterior to the epicondyle.  No kinking was present to the nerve.  With flexion-extension it freely moved.  The wound was copiously irrigated with saline, the subcutaneous tissue closed with interrupted 4-0 Vicryl and the skin with a subcuticular 4-0 Monocryl sutures.  Steri-Strips were applied, a sterile compressive dressing and long-arm splint applied.  The patient tolerated the procedure well and was taken to the recovery room for observation in satisfactory condition.  DISPOSITION:  She is discharged home to return to [facility] [place] in one week on Vicodin and Keflex. Dictated by:   Nicki Reaper, M.D. Attending Physician:  Ronne Binning DD:  10/11/01 TD:  10/11/01 Job: [id number]

## 2011-01-02 NOTE — Op Note (Signed)
[place]. [facility]  Patient:    [name], [name]                      MRN: [id number] Proc. Date: 12/22/00 Adm. Date:  [id number] Attending:  Georgena Spurling                           Operative Report  SURGEON:  Georgena Spurling, M.D.  ASSISTANT:  None.  PREOPERATIVE DIAGNOSIS:  Left carpal tunnel syndrome.  POSTOPERATIVE DIAGNOSIS:  Left carpal tunnel syndrome.  PROCEDURE:  Left open carpal tunnel release.  ANESTHESIA:  General.  INDICATION FOR PROCEDURE:  Patient returned just three weeks out from having the contralateral side done and after informed consent was obtained, she was taken to the operating room.  DESCRIPTION OF PROCEDURE: She was laid supine and administered general anesthesia.  The left upper extremity was prepped and draped in usual sterile fashion.  The extremity was exsanguinated, tourniquet was elevated to 250 mmHg and set for 20 minutes.  A skin scribe was used to mark out ______ line, palmaris longus, other neurovascular structures and then a curvilinear incision was made paralleling the thenar crease in line with the long axis of the ring finger and the palmaris longus, crossing the wrist crease at a 45 degree angle and then swinging back towards the radial side.  Sharp dissection was continued through the skin only and then Metzenbaum scissors were used to perform blunt dissection down to the palmar fascia.  The palmar fascia was incised and the leading edge of the transverse carpal ligament identified distally and then blunt dissection performed beneath that up towards the ulnar side of the palmaris longus.  A #15-blade was used to incise the transverse carpal ligament.  Once this was done, the nerve was identified easily.  There was not as much inflammation as in the contralateral side earlier.  The wound was then irrigated and rechecked for any vessels cauterized with bipolar cautery and then sewed the incision up with  interrupted 4-0 horizontal mattress sutures.  We dressed with Adaptic, 4 x 4s, bulky hand dressing and a wrist splint.  TOURNIQUET TIME:  14 minutes.  COMPLICATIONS:  None.  DRAINS:  None. DD:  12/22/00 TD:  12/22/00 Job: [id number]

## 2011-01-02 NOTE — Op Note (Signed)
[place]. [facility]  Patient:    [name], [name] Visit Number: [id number] MRN: [id number]          Service Type: DSU Location: [facility] Attending Physician:  Ronne Binning Dictated by:   Nicki Reaper, M.D. Proc. Date: 01/19/02 Admit Date:  01/19/2002                             Operative Report  PREOPERATIVE DIAGNOSIS:  Triangular fibrocartilage complex tear, left wrist.  POSTOPERATIVE DIAGNOSIS:  Triangular fibrocartilage complex tear, left wrist.  OPERATION:  Arthroscopy, debridement of triangular fibrocartilage complex flap tear from the lunotriquetral joint, non-destabilized, non-full-thickness, left wrist.  SURGEON:  Nicki Reaper, M.D.  ASSISTANT:  Joaquin Courts, R.N.  ANESTHESIA:  General.  ANESTHESIOLOGIST:  Cliffton Asters. Ivin Booty, M.D.  HISTORY:  The patient is a 60 year old female with a history of ulnar wrist pain.  She has had multiple procedures done in the past for various other problems with her arms.  She has had a MRI performed revealing a TFCC tear.  DESCRIPTION OF PROCEDURE:  The patient was brought to the operating room were a general anesthetic was carried out without difficulty.  She was prepped and draped using Betadine scrubbing solution with the left arm free.  The limb was placed in the arthroscopy tower, 10 pounds of traction applied, the joint inflated through the 3.4 portal, taking care to protect the EPL tendon.  The joint was entered through a transverse incision through skin only and deepened with a hemostat.  A blunt trocar was used to enter the joint.  The joint was inspected.  An irrigation catheter was placed through 6-U using the 18-gauge needle.  The joint was inspected.  The volar radial wrist ligaments were intact, scapholunate ligament was intact.  The ulnar side showed a TFCC 1-A-type tear and a flap coming down from the lunotriquetral joint.  A 4.5 portal was then opened after localization with a 22-gauge  needle, the portal opened similar to the 3.4, the ulnar side inspected.  The flap tear was partial-thickness.  The midcarpal joint was then inspected through the distal 3.4 portal after inflation; the fluid did not communicate.  The midcarpal joint was inspected.  STT joint showed no significant changes.  The scapholunate was tight.  There were no articular changes.  There was a hamate facet in the lunate.  There was no instability of the lunotriquetral joint. There was no erosion on the proximal hamate.  The scope was then reintroduced into the 3.4 proximal.  An ArthroWand was then inserted and a debridement of a very significant synovitis on the ulnar side.  There was no detachment peripherally of the TFCC.  The flap was removed with a full-radius shaver. The articular TFCC central disk was removed with a 25 100 Beaver blade.  This was then smoothed with the shaver and ArthroWand.  There were minimal changes on the articular surface of the distal ulna.  The instruments were removed, the portals closed with interrupted 5-0 nylon sutures.  A sterile compressive dressing and splint were applied.  The patient tolerated the procedure well and was taken to the recovery room for observation in satisfactory condition.  She is discharged home to return to the [facility] in one week on Vicodin and Keflex. Dictated by:   Nicki Reaper, M.D. Attending Physician:  Ronne Binning DD:  01/19/02 TD:  01/20/02 Job: [phone]

## 2011-01-05 ENCOUNTER — Ambulatory Visit (INDEPENDENT_AMBULATORY_CARE_PROVIDER_SITE_OTHER): Payer: Self-pay | Admitting: Cardiovascular Disease

## 2011-01-05 ENCOUNTER — Encounter: Payer: Self-pay | Admitting: Cardiovascular Disease

## 2011-01-05 DIAGNOSIS — I10 Essential (primary) hypertension: Secondary | ICD-10-CM

## 2011-01-05 DIAGNOSIS — E785 Hyperlipidemia, unspecified: Secondary | ICD-10-CM

## 2011-01-05 DIAGNOSIS — I4891 Unspecified atrial fibrillation: Secondary | ICD-10-CM

## 2011-01-05 DIAGNOSIS — E119 Type 2 diabetes mellitus without complications: Secondary | ICD-10-CM

## 2011-01-05 NOTE — Progress Notes (Signed)
[name] is a 60 yo female with a h/o diabetes, hypertension, hyperlipidemia and asthma who was admitted September 05 2009 with chest pain, syncope and shortness of breath. She was noted to be in atrial fibrillation with rapid ventricular rate. Rate control medications were started. She was placed on warfarin due to her high stroke risk. Echocardiogram demonstrated an EF of 55-60% and moderate LVH. She eventually converted to normal sinus rhythm. Had normal myovue 2/12. Subsequently admitted to the hospital with recurrent afib and started on [name]. Patient assistance forms have been filed and she has been getting samples from our office.  BP has been borderline.  Encouraged her to F/U for her sleep study both in terms of BP control and decreasing recurrences of PAF.  Can change losartan to hyzaar in future if needed  HbA1c still over 8 and insulin being adjusted by Dr. Oren Bracket at [facility]  ROS: Denies fever, malais, weight loss, blurry vision, decreased visual acuity, cough, sputum, SOB, hemoptysis, pleuritic pain, palpitaitons, heartburn, abdominal pain, melena, lower extremity edema, claudication, or rash.   General: Affect appropriate Healthy:  appears stated age HEENT: normal Neck supple with no adenopathy JVP normal no bruits no thyromegaly Lungs clear with no wheezing and good diaphragmatic motion Heart:  S1/S2 no murmur,rub, gallop or click PMI normal Abdomen: benighn, BS positve, no tenderness, no AAA no bruit.  No HSM or HJR Distal pulses intact with no bruits No edema Neuro non-focal Skin warm and dry No muscular weakness   Current Outpatient Prescriptions  Medication Sig Dispense Refill  . albuterol (PROVENTIL) (2.5 MG/3ML) 0.083% nebulizer solution Take 2.5 mg by nebulization every 6 (six) hours as needed.        . Ascorbic Acid (VITAMIN C) 500 MG tablet Take 500 mg by mouth daily.        . calcium-vitamin D (OSCAL WITH D) 500-200 MG-UNIT per tablet Take 1  tablet by mouth daily.        . Cranberry 405 MG CAPS Take by mouth. 1 po bid       . diltiazem (TIAZAC) 120 MG 24 hr capsule Take 120 mg by mouth daily.        Marland Kitchen dofetilide ([name]) 250 MCG capsule Take 250 mcg by mouth 2 (two) times daily.        . ferrous sulfate 325 (65 FE) MG EC tablet Take 325 mg by mouth daily with breakfast.        . fluticasone (FLOVENT HFA) 110 MCG/ACT inhaler Inhale 1 puff into the lungs 2 (two) times daily.        . hydroxypropyl methylcellulose (ISOPTO TEARS) 2.5 % ophthalmic solution Place 1 drop into both eyes 3 (three) times daily as needed.        . insulin aspart (NOVOLOG) 100 UNIT/ML injection 6 units tid      . insulin glargine (LANTUS) 100 UNIT/ML injection Inject into the skin. 22 units daily      . levocetirizine (XYZAL) 5 MG tablet Take 5 mg by mouth as needed.        Marland Kitchen losartan (COZAAR) 100 MG tablet Take 100 mg by mouth daily.        . metFORMIN (GLUMETZA) 500 MG (MOD) 24 hr tablet Take 500 mg by mouth 2 (two) times daily with a meal.        . Naphazoline-Pheniramine (OPCON-A) 0.027-0.315 % SOLN Apply to eye as needed.        . nitroGLYCERIN (NITROSTAT) 0.4 MG SL  tablet Place 0.4 mg under the tongue every 5 (five) minutes as needed.        . Omega-3 Fatty Acids (FISH OIL) 1000 MG CAPS 1 po bid      . pantoprazole (PROTONIX) 40 MG tablet Take 40 mg by mouth daily.        . rizatriptan (MAXALT) 10 MG tablet Take 10 mg by mouth as needed. May repeat in 2 hours if needed       . rosuvastatin (CRESTOR) 10 MG tablet Take 10 mg by mouth daily.        . temazepam (RESTORIL) 15 MG capsule Take 15 mg by mouth at bedtime.        . traMADol (ULTRAM) 50 MG tablet Take 50 mg by mouth every 6 (six) hours as needed.        . triamcinolone (NASACORT) 55 MCG/ACT nasal inhaler 2 sprays by Nasal route daily.        Marland Kitchen warfarin (COUMADIN) 5 MG tablet 5 mg. Use as directed       . DISCONTD: calcium-vitamin D (OSCAL WITH D) 500-200 MG-UNIT per tablet Take 1 tablet by mouth  daily.        Marland Kitchen DISCONTD: dofetilide ([name]) 125 MCG capsule Take 125 mcg by mouth 2 (two) times daily.        Marland Kitchen DISCONTD: mirtazapine (REMERON) 15 MG tablet Take 15 mg by mouth at bedtime.        Marland Kitchen DISCONTD: niacin (NIASPAN) 500 MG CR tablet Take 500 mg by mouth at bedtime.          Allergies  Hydromorphone hcl  Electrocardiogram:  Assessment and Plan

## 2011-01-05 NOTE — Assessment & Plan Note (Signed)
Cholesterol is at goal.  Continue current dose of statin and diet Rx.  No myalgias or side effects.  F/U  LFT's in 6 months. Lab Results  Component Value Date   LDLCALC  Value: UNABLE TO CALCULATE IF TRIGLYCERIDE OVER 400 mg/dL        Total Cholesterol/HDL:CHD Risk Coronary Heart Disease Risk Table                     Men   Women  1/2 Average Risk   3.4   3.3  Average Risk       5.0   4.4  2 X Average Risk   9.6   7.1  3 X Average Risk  23.4   11.0        Use the calculated Patient Ratio above and the CHD Risk Table to determine the patient's CHD Risk.        ATP III CLASSIFICATION (LDL):  <100     mg/dL   Optimal  100-129  mg/dL   Near or Above                    Optimal  130-159  mg/dL   Borderline  160-189  mg/dL   High  >190     mg/dL   Very High 09/08/2010             

## 2011-01-05 NOTE — Assessment & Plan Note (Signed)
Maint NSR on [name]. Continue anticoagulation with coumadin

## 2011-01-05 NOTE — Assessment & Plan Note (Signed)
Low sodium diet.  Consider adding diuretic in future

## 2011-01-05 NOTE — Assessment & Plan Note (Signed)
Discussed low carb diet.  F/U Dr Andrey Campanile

## 2011-01-05 NOTE — Patient Instructions (Signed)
Your physician recommends that you schedule a follow-up appointment in: 6 MONTHS WITH DR NISHAN  Your physician recommends that you continue on your current medications as directed. Please refer to the Current Medication list given to you today. 

## 2011-01-15 ENCOUNTER — Emergency Department (HOSPITAL_COMMUNITY): Payer: Self-pay

## 2011-01-15 ENCOUNTER — Inpatient Hospital Stay (HOSPITAL_COMMUNITY)
Admission: EM | Admit: 2011-01-15 | Discharge: 2011-01-18 | DRG: 287 | Disposition: A | Discharge status: Home / Self Care | Payer: Medicaid Other | Attending: Cardiovascular Disease | Admitting: Cardiovascular Disease

## 2011-01-15 ENCOUNTER — Emergency Department (HOSPITAL_COMMUNITY)
Admission: EM | Admit: 2011-01-15 | Discharge: 2011-01-15 | Disposition: A | Discharge status: Other Acute Inpatient Hospital | Payer: Self-pay | Attending: Emergency Medicine | Admitting: Emergency Medicine

## 2011-01-15 DIAGNOSIS — I10 Essential (primary) hypertension: Secondary | ICD-10-CM | POA: Insufficient documentation

## 2011-01-15 DIAGNOSIS — R079 Chest pain, unspecified: Secondary | ICD-10-CM

## 2011-01-15 DIAGNOSIS — E119 Type 2 diabetes mellitus without complications: Secondary | ICD-10-CM | POA: Insufficient documentation

## 2011-01-15 DIAGNOSIS — E876 Hypokalemia: Secondary | ICD-10-CM | POA: Diagnosis present

## 2011-01-15 DIAGNOSIS — I4891 Unspecified atrial fibrillation: Principal | ICD-10-CM | POA: Diagnosis present

## 2011-01-15 DIAGNOSIS — Z7901 Long term (current) use of anticoagulants: Secondary | ICD-10-CM

## 2011-01-15 DIAGNOSIS — I219 Acute myocardial infarction, unspecified: Secondary | ICD-10-CM | POA: Insufficient documentation

## 2011-01-15 DIAGNOSIS — J45909 Unspecified asthma, uncomplicated: Secondary | ICD-10-CM | POA: Diagnosis present

## 2011-01-15 DIAGNOSIS — Z794 Long term (current) use of insulin: Secondary | ICD-10-CM | POA: Insufficient documentation

## 2011-01-15 DIAGNOSIS — E785 Hyperlipidemia, unspecified: Secondary | ICD-10-CM | POA: Diagnosis present

## 2011-01-15 DIAGNOSIS — I517 Cardiomegaly: Secondary | ICD-10-CM

## 2011-01-15 LAB — BASIC METABOLIC PANEL
BUN: 13 mg/dL (ref 6–23)
Calcium: 8.6 mg/dL (ref 8.4–10.5)
Creatinine, Ser: 1.23 mg/dL — ABNORMAL HIGH (ref 0.4–1.2)
GFR calc non Af Amer: 45 mL/min — ABNORMAL LOW (ref >60)
Glucose, Bld: 164 mg/dL — ABNORMAL HIGH (ref 70–99)
Potassium: 3.9 mEq/L (ref 3.5–5.1)

## 2011-01-15 LAB — CBC
HCT: 34 % — ABNORMAL LOW (ref 36.0–46.0)
Hemoglobin: 12.9 g/dL (ref 12.0–15.0)
MCH: 29 pg (ref 26.0–34.0)
MCHC: 34.1 g/dL (ref 30.0–36.0)
MCV: 85 fL (ref 78.0–100.0)
RBC: 4.47 MIL/uL (ref 3.87–5.11)
RDW: 13.6 % (ref 11.5–15.5)
WBC: 4.8 10*3/uL (ref 4.0–10.5)

## 2011-01-15 LAB — COMPREHENSIVE METABOLIC PANEL
ALT: 19 U/L (ref 0–35)
AST: 31 U/L (ref 0–37)
Alkaline Phosphatase: 90 U/L (ref 39–117)
CO2: 24 mEq/L (ref 19–32)
Chloride: 104 mEq/L (ref 96–112)
GFR calc Af Amer: 50 mL/min — ABNORMAL LOW (ref >60)
GFR calc non Af Amer: 42 mL/min — ABNORMAL LOW (ref >60)
Glucose, Bld: 162 mg/dL — ABNORMAL HIGH (ref 70–99)
Sodium: 143 mEq/L (ref 135–145)
Total Bilirubin: 0.3 mg/dL (ref 0.3–1.2)

## 2011-01-15 LAB — PROTIME-INR
INR: 1.19 (ref 0.00–1.49)
Prothrombin Time: 15.3 seconds — ABNORMAL HIGH (ref 11.6–15.2)

## 2011-01-15 LAB — GLUCOSE, CAPILLARY
Glucose-Capillary: 192 mg/dL — ABNORMAL HIGH (ref 70–99)
Glucose-Capillary: 170 mg/dL — ABNORMAL HIGH (ref 70–99)

## 2011-01-15 LAB — APTT: aPTT: 32 seconds (ref 24–37)

## 2011-01-15 NOTE — H&P (Signed)
[name], [name]               ACCOUNT NO.:  [id number]  MEDICAL RECORD NO.:  [id number]           PATIENT TYPE:  E  LOCATION:  [facility]                         FACILITY:  [facility]  PHYSICIAN:  Brayton El, MD    DATE OF BIRTH:  [birth year]  DATE OF ADMISSION:  01/15/2011 DATE OF DISCHARGE:                             HISTORY & PHYSICAL   CHIEF COMPLAINTS:  Chest pain.  HISTORY OF PRESENT ILLNESS:  The patient is a 60 year old black female past medical history significant for atrial fibrillation, diabetes, hypertension, hyperlipidemia who is presenting with AFib with RVR and chest discomfort.  The patient states that she was in her normal state of health this evening when she felt herself going to atrial fibrillation with a rapid rate.  She endorses palpitations and chest discomfort that was typical for her atrial fibrillation.  She said once the atrial fibrillation come down, the chest discomfort persisted longer and to a more intense degree than it usually does.  In the [facility] Emergency Room, she was thought to have a mm of ST-segment elevation in leads V1-V2 and a code STEMI was called.  She was given aspirin and a heparin bolus.  Upon arrival to [facility] Emergency Room, the patient states her chest discomfort continues but is very mild.  PAST MEDICAL HISTORY:  As above in HPI.  In addition, the patient has asthma.  Of note, she is also having had a negative Myoview study in February of this year.  SOCIAL HISTORY:  No tobacco or alcohol.  She is a Lawyer, lives in [place].  Family history is negative for atrial fibrillation.  ALLERGIES:  DILAUDID and ELAVIL.  MEDICATIONS:  Please see medication reconciliation list.  The patient is on Coumadin.  REVIEW OF SYSTEMS:  As in HPI.  All other systems were reviewed and are negative.  PHYSICAL EXAMINATION:  VITAL SIGNS:  The patient's blood pressure was 140/80, heart rate in the 90s, satting 100% 2 liters. GENERAL:   No acute distress. HEENT:  Normocephalic, atraumatic. NECK:  Supple. HEART:  Irregularly irregular without murmur, rub or gallop. LUNGS:  Clear bilaterally. ABDOMEN:  Soft, nontender, nondistended. EXTREMITIES:  Without edema. SKIN:  Warm and dry. PSYCHIATRIC:  The patient is appropriate. NEUROLOGIC:  No focal deficits.  LABORATORY DATA:  Review of the patient's labs, CMP is within normal limits.  Potassium is 3.7 and the creatinine 1.3 and glucose is 162. Her CBC; white count of 5, hemoglobin 13, hematocrit 38, platelet count 218.  INR is 1.2, troponin is less than 0.30.  CK is 506, CK-MB is 2.7. EKG independently interpreted by myself demonstrates atrial fibrillation with controlled ventricular response.  She has perhaps 1/2-mm of ST- segment elevation in leads V1-V2, which is unchanged from her EKG in March of this year.  ASSESSMENT:  A 60 year old female presenting with recent episode with atrial fibrillation with rapid ventricular response.  Her chest discomfort is likely secondary to the atrial fibrillation, however,because of the EKG abnormalities albeit chronic, emergency room called the code STEMI.  PLAN:  The patient will be taken to the heart catheterization lab for left  heart catheterization and possible PCI.  Procedure was explained to the patient.  She agrees to the procedure.  Of note in the emergency room, the patient was given aspirin and heparin bolus.     Brayton El, MD     SGA/MEDQ  D:  01/15/2011  T:  01/15/2011  Job:  [id number]  Electronically Signed by Raynelle Bring MD on 01/15/2011 11:47:44 AM

## 2011-01-16 DIAGNOSIS — I2510 Atherosclerotic heart disease of native coronary artery without angina pectoris: Secondary | ICD-10-CM

## 2011-01-16 LAB — BASIC METABOLIC PANEL
CO2: 26 mEq/L (ref 19–32)
Calcium: 8.8 mg/dL (ref 8.4–10.5)
GFR calc Af Amer: 49 mL/min — ABNORMAL LOW (ref >60)
Potassium: 3.7 mEq/L (ref 3.5–5.1)
Sodium: 139 mEq/L (ref 135–145)

## 2011-01-16 LAB — GLUCOSE, CAPILLARY: Glucose-Capillary: 141 mg/dL — ABNORMAL HIGH (ref 70–99)

## 2011-01-17 LAB — BASIC METABOLIC PANEL
CO2: 27 mEq/L (ref 19–32)
Glucose, Bld: 144 mg/dL — ABNORMAL HIGH (ref 70–99)
Potassium: 3.9 mEq/L (ref 3.5–5.1)
Sodium: 140 mEq/L (ref 135–145)

## 2011-01-17 LAB — GLUCOSE, CAPILLARY
Glucose-Capillary: 134 mg/dL — ABNORMAL HIGH (ref 70–99)
Glucose-Capillary: 132 mg/dL — ABNORMAL HIGH (ref 70–99)

## 2011-01-18 LAB — GLUCOSE, CAPILLARY
Glucose-Capillary: 141 mg/dL — ABNORMAL HIGH (ref 70–99)
Glucose-Capillary: 166 mg/dL — ABNORMAL HIGH (ref 70–99)

## 2011-01-18 LAB — PROTIME-INR: Prothrombin Time: 19.2 seconds — ABNORMAL HIGH (ref 11.6–15.2)

## 2011-01-20 NOTE — Cardiovascular Report (Signed)
[name], [name]               ACCOUNT NO.:  [id number]  MEDICAL RECORD NO.:  [id number]           PATIENT TYPE:  I  LOCATION:  2915                         FACILITY:  [facility]  PHYSICIAN:  Verne Carrow, MDDATE OF BIRTH:  [birth year]  DATE OF PROCEDURE:  01/15/2011 DATE OF DISCHARGE:                           CARDIAC CATHETERIZATION   PRIMARY CARDIOLOGIST:  Theron Arista C. [name], MD, [facility].  PROCEDURES PERFORMED: 1. Left heart catheterization. 2. Selective coronary angiography. 3. Left ventricular angiogram. 4. Placement of an Angio-Seal femoral artery closure device in the     right femoral artery.  OPERATOR:  Verne Carrow, MD.  INDICATIONS:  This a 60 year old African American female with a history of diabetes mellitus, hypertension, hyperlipidemia, and atrial fibrillation who presented to the [facility] Emergency Department with complaints of severe chest pain.  The patient's initial EKG showed atrial fibrillation with rapid ventricular response.  Her rate was slowed and she continued to have complaints of chest pain.  She did have mild ST-segment changes in leads V2 and V3 that mildly progressed over the course of 1 hour.  Because of this, the emergency department physician activated a code STEMI.  The patient was transported to the [facility] for diagnostic catheterization.  DETAILS OF PROCEDURE:  The patient was placed supine on the cath table. Emergency consent was obtained.  The right groin was prepped and draped in sterile fashion.  A 1% lidocaine was used for local anesthesia.  A 6- French sheath was inserted into the right femoral artery without difficulty.  Standard diagnostic catheters were used to perform selective coronary angiography.  A pigtail catheter was used to perform a left ventricular angiogram.  The patient tolerated the procedure well. The patient had no evidence of coronary artery disease.  She was taken to the stepdown ICU in  stable condition.  HEMODYNAMIC FINDINGS:  Central aortic pressure 111/62.  Left ventricular pressure 127/12.  ANGIOGRAPHIC FINDINGS:  The left main coronary artery bifurcated into the circumflex and the left anterior descending artery.  There was no evidence of disease in the left main artery.  Circumflex artery gave off several very small-caliber obtuse marginal branches followed by a moderate-to-large size trifurcating obtuse marginal branch.  There was no evidence of disease in this system.  The left anterior descending was a large vessel that coursed to the apex and gave off a moderate-sized diagonal branch.  There was no evidence disease in this system.  The right coronary artery was a large dominant vessel with no evidence of disease.  Left ventricular angiogram was performed in the RAO projection and it showed normal left ventricular systolic function with an ejection fraction of 65%.  IMPRESSION: 1. No angiographic evidence of coronary artery disease. 2. Normal left ventricular systolic function. 3. Chest pain of unknown etiology.  RECOMMENDATIONS:  At this point, she will be admitted to the stepdown ICU for rate control for atrial fibrillation with a calcium channel blocker.  We will check the D-dimer and TSH.  Further plans for chronic anticoagulation and rate control per Dr. [name].     Verne Carrow, MD     CM/MEDQ  D:  01/15/2011  T:  01/15/2011  Job:  [id number]  cc:   Noralyn Pick. [name], MD, [facility]  Electronically Signed by Verne Carrow MD on 01/20/2011 09:55:52 AM

## 2011-02-05 ENCOUNTER — Encounter: Payer: Self-pay | Admitting: Cardiovascular Disease

## 2011-02-05 ENCOUNTER — Ambulatory Visit (INDEPENDENT_AMBULATORY_CARE_PROVIDER_SITE_OTHER): Payer: Self-pay | Admitting: Cardiovascular Disease

## 2011-02-05 DIAGNOSIS — I10 Essential (primary) hypertension: Secondary | ICD-10-CM

## 2011-02-05 DIAGNOSIS — R079 Chest pain, unspecified: Secondary | ICD-10-CM

## 2011-02-05 DIAGNOSIS — E785 Hyperlipidemia, unspecified: Secondary | ICD-10-CM

## 2011-02-05 DIAGNOSIS — I4891 Unspecified atrial fibrillation: Secondary | ICD-10-CM

## 2011-02-05 NOTE — Patient Instructions (Signed)
Your physician wants you to follow-up in:  6 months. You will receive a reminder letter in the mail two months in advance. If you don't receive a letter, please call our office to schedule the follow-up appointment.   

## 2011-02-05 NOTE — Assessment & Plan Note (Addendum)
Maint NSR on tikosyn.  Indigent program forms signed and faxed in

## 2011-02-05 NOTE — Progress Notes (Signed)
[name] is a 60 yo female with a h/o diabetes, hypertension, hyperlipidemia and asthma who was admitted September 05 2009 with chest pain, syncope and shortness of breath. She was noted to be in atrial fibrillation with rapid ventricular rate. Rate control medications were started. She was placed on warfarin due to her high stroke risk. Echocardiogram demonstrated an EF of 55-60% and moderate LVH. She eventually converted to normal sinus rhythm. Had normal myovue 2/12. Subsequently admitted to the hospital with recurrent afib and started on [name]. Patient assistance forms have been filed and she has been getting samples from our office. BP has been borderline. Encouraged her to F/U for her sleep study both in terms of BP control and decreasing recurrences of PAF. Can change losartan to hyzaar in future if needed  HbA1c still over 8 and insulin being adjusted by Dr. Oren Bracket at [facility]  Subsequent cath 5/12 by Dr Alice Reichert with normal cors  ROS: Denies fever, malais, weight loss, blurry vision, decreased visual acuity, cough, sputum, SOB, hemoptysis, pleuritic pain, palpitaitons, heartburn, abdominal pain, melena, lower extremity edema, claudication, or rash.  All other systems reviewed and negative  General: Affect appropriate Healthy:  appears stated age HEENT: normal Neck supple with no adenopathy JVP normal no bruits no thyromegaly Lungs clear with no wheezing and good diaphragmatic motion Heart:  S1/S2 no murmur,rub, gallop or click PMI normal Abdomen: benighn, BS positve, no tenderness, no AAA no bruit.  No HSM or HJR Distal pulses intact with no bruits No edema Neuro non-focal Skin warm and dry No muscular weakness   Current Outpatient Prescriptions  Medication Sig Dispense Refill  . albuterol (PROVENTIL) (2.5 MG/3ML) 0.083% nebulizer solution Take 2.5 mg by nebulization every 6 (six) hours as needed.        . Ascorbic Acid (VITAMIN C) 500 MG tablet Take 500 mg by  mouth daily.        . Calcium Carb-Cholecalciferol (OYSTER SHELL CALCIUM PLUS D) 500-125 MG-UNIT TABS 1 tab po qd       . Cranberry 405 MG CAPS Take by mouth. 1 po bid       . diltiazem (CARDIZEM CD) 180 MG 24 hr capsule Take 180 mg by mouth daily.        Marland Kitchen dofetilide ([name]) 250 MCG capsule Take 250 mcg by mouth 2 (two) times daily.        . ferrous sulfate 325 (65 FE) MG EC tablet Take 325 mg by mouth daily with breakfast.        . fluticasone (FLOVENT HFA) 110 MCG/ACT inhaler Inhale 1 puff into the lungs 2 (two) times daily.        . insulin aspart (NOVOLOG) 100 UNIT/ML injection 6 units tid      . insulin glargine (LANTUS) 100 UNIT/ML injection Inject into the skin. 22 units daily      . levocetirizine (XYZAL) 5 MG tablet Take 5 mg by mouth as needed.        Marland Kitchen losartan (COZAAR) 100 MG tablet Take 100 mg by mouth daily.        . metFORMIN (GLUMETZA) 500 MG (MOD) 24 hr tablet Take 500 mg by mouth 2 (two) times daily with a meal.        . Naphazoline-Pheniramine (OPCON-A) 0.027-0.315 % SOLN Apply to eye as needed.        . niacin (NIASPAN) 500 MG CR tablet Take 500 mg by mouth at bedtime.        Marland Kitchen  nitroGLYCERIN (NITROSTAT) 0.4 MG SL tablet Place 0.4 mg under the tongue every 5 (five) minutes as needed.        . Omega-3 Fatty Acids (FISH OIL) 1000 MG CAPS 1 po bid      . pantoprazole (PROTONIX) 40 MG tablet Take 40 mg by mouth daily.        . rizatriptan (MAXALT) 10 MG tablet Take 10 mg by mouth as needed. May repeat in 2 hours if needed       . rosuvastatin (CRESTOR) 10 MG tablet Take 10 mg by mouth daily.        . temazepam (RESTORIL) 15 MG capsule Take 15 mg by mouth at bedtime.        . traMADol (ULTRAM) 50 MG tablet Take 50 mg by mouth every 6 (six) hours as needed.        . triamcinolone (NASACORT) 55 MCG/ACT nasal inhaler 2 sprays by Nasal route daily.        Marland Kitchen warfarin (COUMADIN) 5 MG tablet 5 mg. Use as directed       . hydroxypropyl methylcellulose (ISOPTO TEARS) 2.5 % ophthalmic  solution Place 1 drop into both eyes 3 (three) times daily as needed.        Marland Kitchen DISCONTD: calcium-vitamin D (OSCAL WITH D) 500-200 MG-UNIT per tablet Take 1 tablet by mouth daily.        Marland Kitchen DISCONTD: diltiazem (TIAZAC) 120 MG 24 hr capsule Take 120 mg by mouth daily.          Allergies  Hydromorphone hcl  Electrocardiogram:  Assessment and Plan

## 2011-02-05 NOTE — Assessment & Plan Note (Signed)
Well controlled.  Continue current medications and low sodium Dash type diet.    

## 2011-02-05 NOTE — Assessment & Plan Note (Signed)
Atypical Normal cath.  No further w/u

## 2011-02-05 NOTE — Discharge Summary (Signed)
[name], [name]               ACCOUNT NO.:  [id number]  MEDICAL RECORD NO.:  [id number]           PATIENT TYPE:  I  LOCATION:  2035                         FACILITY:  [facility]  PHYSICIAN:  Vesta Mixer, M.D. DATE OF BIRTH:  13-Apr-1951  DATE OF ADMISSION:  01/15/2011 DATE OF DISCHARGE:  01/18/2011                              DISCHARGE SUMMARY   PRIMARY CARDIOLOGIST:  Theron Arista C. [name], MD, [facility]  DISCHARGE DIAGNOSES: 1. Chest pain, noncardiac.     a.     Normal coronary angiography, this admission.     b.     Normal D-dimer. 2. Paroxysmal atrial fibrillation with rapid ventricular response.     a.     Chronic Coumadin. 3. Relative hypotension.  SECONDARY DIAGNOSES: 1. Paroxysmal atrial fibrillation.     a.     Chronic Coumadin. 2. Type 2 diabetes mellitus. 3. Hypertension. 4. Dyslipidemia.  REASON FOR ADMISSION:  The patient is a 60 year old female, with history of paroxysmal atrial fibrillation, but no known history of coronary artery disease, who initially presented to [facility] with complaint of chest pain, in the context of PAF with RVR.  Initial electrocardiogram was suggestive of anterior ST elevation, and a code STEMI was called.  She was, thus, transferred emergently to the Cardiac Catheterization Lab.  Emergent procedure was performed by Dr. Verne Carrow, who noted no angiographic evidence of coronary artery disease, as well as normal left ventricular function.  A followup D-dimer was obtained, and within normal limits.  The remainder of her short course was notable for stabilization of  her medication regimen, with initial up titration of Cardizem to 240 mg  daily.  However, she developed relative bradycardia with rates in the 50 bpm range, and dose was decreased to 180 mg daily, wherein she remained by time of discharge.  A 2-D echo was also obtained, indicating normal LVEF (EF 60-65%), with normal wall motion, and no significant  valvular abnormalities.  The patient reported compliance with her [name] medication at home, and  this was continued at 250 mcg q.12 hours.  Serial EKGs were obtained,  indicating stable QT interval of less than 500 milliseconds.  A final EKG,  prior to discharge, is currently pending.  The patient also developed relative hypotension, with systolics in the 90 - 100 range.  She was initially placed on hydrochlorothiazide, which was then discontinued.  An ACE inhibitor was also added.  However, the  patient was on an ARB at home, which will be continued, but at half the  previous dose, secondary to her relative hypotension.  DISCHARGE LABORATORIES:  INR 1.6.  OUTSTANDING LABORATORIES:  TSH 2.6.  Initial potassium 3.7, with a level of 3.9, pre discharge.  Renal function remained stable, with initial creatinine of 1.3, 1.2 pre discharge.  Initial cardiac markers notable for normal MB and troponin, with elevated total CPK 506.  Normal liver function tests.  Normal CBC on admission.  ADMISSION CHEST X-RAY:  No acute abnormalities.  DISPOSITION:  Stable.  FOLLOWUP: 1. Dr. Charlton Haws in 2-3 weeks.  Arrangements to be made through our  office. 2. Follow up with [facility] on Monday/Tuesday of next week, for     PT/INR check.  DISCHARGE MEDICATIONS: 1. Diltiazem CD 180 mg daily. 2. Dofetilide 250 mcg q. 12 hours. 3. Losartan 50 mg daily. 4. Albuterol MDI 2 puffs b.i.d. 5. Crestor 20 mg at bedtime. 6. Ferrous sulfate 325 mg daily. 7. Fish oil 2 g b.i.d. 8. Lantus 22 units at bedtime. 9. Metformin 500 mg b.i.d. 10.Mirtazapine 50 mg at bedtime. 11.Nasacort 1 spray b.i.d. 12.Niaspan SR 500 mg at bedtime. 13.NovoLog 6 units t.i.d. 14.Protonix 40 mg daily. 15.Coumadin 5 mg as directed.  DURATION OF DISCHARGE ENCOUNTER:  Greater than 30 minutes, including physician time.     Gene Serpe, PA-C   ______________________________ Vesta Mixer, M.D.   GS/MEDQ  D:   01/18/2011  T:  01/19/2011  Job:  [id number]  cc:   Clinic [facility]  Electronically Signed by Rozell Searing PA-C on 01/20/2011 12:11:33 PM Electronically Signed by Kristeen Miss M.D. on 02/05/2011 09:15:07 AM

## 2011-02-05 NOTE — Assessment & Plan Note (Signed)
Cholesterol is at goal.  Continue current dose of statin and diet Rx.  No myalgias or side effects.  F/U  LFT's in 6 months. Lab Results  Component Value Date   LDLCALC  Value: UNABLE TO CALCULATE IF TRIGLYCERIDE OVER 400 mg/dL        Total Cholesterol/HDL:CHD Risk Coronary Heart Disease Risk Table                     Men   Women  1/2 Average Risk   3.4   3.3  Average Risk       5.0   4.4  2 X Average Risk   9.6   7.1  3 X Average Risk  23.4   11.0        Use the calculated Patient Ratio above and the CHD Risk Table to determine the patient's CHD Risk.        ATP III CLASSIFICATION (LDL):  <100     mg/dL   Optimal  100-129  mg/dL   Near or Above                    Optimal  130-159  mg/dL   Borderline  160-189  mg/dL   High  >190     mg/dL   Very High 09/08/2010             

## 2011-02-16 ENCOUNTER — Telehealth: Payer: Self-pay | Admitting: Cardiovascular Disease

## 2011-02-16 NOTE — Telephone Encounter (Signed)
Pt calling needing to talk to nurse regarding low HR, last couple times pt has checked HR it has been in low 50's. This morning pt HR was 55. Pt would like to be advised if an appt is necessary with Dr. [name].

## 2011-02-16 NOTE — Telephone Encounter (Signed)
Left message for pt that a heart rate in the 50's is normal when taking meds like tikosyn as long as she is feeling okay. She does not need to see dr [name] for her heart rate. Pt to call me back if she has questions or concerns Deliah Goody

## 2011-02-19 ENCOUNTER — Telehealth: Payer: Self-pay | Admitting: *Deleted

## 2011-02-19 ENCOUNTER — Telehealth: Payer: Self-pay | Admitting: Cardiovascular Disease

## 2011-02-19 NOTE — Telephone Encounter (Signed)
Left message for pt that tikosyn from the company is at the front desk for pick up [name]

## 2011-02-19 NOTE — Telephone Encounter (Signed)
Pt rtn [name]'s call °

## 2011-02-19 NOTE — Telephone Encounter (Signed)
Pt here to pick up tikosyn samples, she reports feeling dizzy a lot. She was recently in the hosp and her cardizem was increased and her losartan was cut in one half. She reports her bp 140/150's/80's but reports her pulse is in the low 50's. When she has the dizziness she lies down until she feels better. Will make dr [name] aware [name]

## 2011-02-19 NOTE — Telephone Encounter (Signed)
Spoke with pt, she is aware tikosyn at the front desk for pick up [employer]

## 2011-02-26 ENCOUNTER — Encounter: Payer: Self-pay | Admitting: Internal Medicine

## 2011-02-26 ENCOUNTER — Telehealth: Payer: Self-pay | Admitting: Cardiovascular Disease

## 2011-02-26 NOTE — Telephone Encounter (Signed)
Heart rate going up and down-pls advise

## 2011-02-26 NOTE — Telephone Encounter (Signed)
Change cozzaar to hyzaar 100/12.5  BMET and BNP and ECG in 4 weeks

## 2011-02-26 NOTE — Telephone Encounter (Signed)
Spoke with pt, she has noticed her bp is running consistently high, 140-150/80. Her cardizem was increased and her losartan was decreased while in the hosp. She also reports today an episode of her bp, heasrt rate and blood sugar being elevated all at one time. It lasted about 15 min. She took one NTG and feels much better now. Will make dr [name] aware Deliah Goody

## 2011-02-27 ENCOUNTER — Telehealth: Payer: Self-pay | Admitting: *Deleted

## 2011-02-27 NOTE — Telephone Encounter (Signed)
Error Deliah Goody

## 2011-02-27 NOTE — Telephone Encounter (Signed)
Spoke with pt, script called to [facility]. She will cont to track bp and let us know how she is doing Deliah Goody

## 2011-03-01 ENCOUNTER — Ambulatory Visit (HOSPITAL_BASED_OUTPATIENT_CLINIC_OR_DEPARTMENT_OTHER): Payer: Self-pay | Attending: Family Medicine

## 2011-03-01 DIAGNOSIS — G4710 Hypersomnia, unspecified: Secondary | ICD-10-CM | POA: Insufficient documentation

## 2011-03-07 DIAGNOSIS — G4710 Hypersomnia, unspecified: Secondary | ICD-10-CM

## 2011-03-07 DIAGNOSIS — G4730 Sleep apnea, unspecified: Secondary | ICD-10-CM

## 2011-03-07 NOTE — Procedures (Signed)
NAMECAELEN, HIGINBOTHAM               ACCOUNT NO.:  [id number]  MEDICAL RECORD NO.:  [id number]          PATIENT TYPE:  OUT  LOCATION:  SLEEP CENTER                 FACILITY:  [facility]  PHYSICIAN:  Clinton D. Maple Hudson, MD, FCCP, FACPDATE OF BIRTH:  1951/03/09  DATE OF STUDY:  03/01/2011                           NOCTURNAL POLYSOMNOGRAM  REFERRING PHYSICIAN:  AMELIA WILSON  INDICATION FOR STUDY:  Hypersomnia with sleep apnea.  EPWORTH SLEEPINESS SCORE:  21/24, BMI 33.9.  Weight 210 pounds, height 66 inches.  Neck 15 inches.  MEDICATIONS:  Home medications are charted and reviewed.  SLEEP ARCHITECTURE:  Total sleep time 317.5 minutes with sleep efficiency 73.6%.  Stage I was 11.7%, stage II 75.6%, stage III absent, REM 12.8% of total sleep time.  Sleep latency 47.5 minutes, REM latency 134 minutes, awake after sleep onset 67.5 minutes, arousal index 33.1. Bedtime medication:  NovoLog, metformin , niacin, temazepam .  RESPIRATORY DATA:  Apnea/hypopnea index (AHI) 0.2 per hour.  A single event was scored as a hypopnea while non supine in nonREM sleep.  She did not qualify for CPAP titration.  OXYGEN DATA:  Moderate snoring with oxygen desaturation to a nadir of 92% and a mean oxygen saturation through the study of 94.1% on room air.  CARDIAC DATA:  Normal sinus rhythm.  MOVEMENT-PARASOMNIA:  No significant movement disturbance.  No bathroom trips.  IMPRESSION-RECOMMENDATIONS: 1. Sleep architecture was fragmented by frequent brief nonspecific     waking despite temazepam.  In her arrival history, she reported     that sleep at home is disturbed by choking episodes.  This is a     characteristic description of reflux events which should be     considered clinically. 2. Occasional respiratory event with sleep disturbance, within normal     limits, AHI 0.2 per hour (normal range 0-5     per hour).  Moderate snoring with oxygen desaturation to a nadir of     92% and a mean oxygen  saturation through the study of 94.1% on room     air.  She did not qualify for CPAP titration trial.     Clinton D. Maple Hudson, MD, [employer], FACP Diplomate, Biomedical engineer of Sleep Medicine Electronically Signed    CDY/MEDQ  D:  03/07/2011 13:27:44  T:  03/07/2011 21:55:09  Job:  [id number]

## 2011-03-18 ENCOUNTER — Encounter: Payer: Self-pay | Admitting: Cardiovascular Disease

## 2011-03-19 ENCOUNTER — Telehealth: Payer: Self-pay | Admitting: Cardiovascular Disease

## 2011-03-19 NOTE — Telephone Encounter (Signed)
Per pt call, pt BP medicine was recently changed b/c it was high. Not pt is c/o low blood pressure, 115/40. Pt is now c/o feeling dizzy. Pt has been feeling dizzy for a little over a week. Pt would like a nurse to return pt call to advise/discuss.

## 2011-03-19 NOTE — Telephone Encounter (Signed)
Spoke with [name]. She called because the visiting nurse check her B/P every day and she wanted to let Dr. [name] know what her B/P is doing since changing from Cozaar to Hyzaar 100/12.5 mg. [name]'s B/P today is 114/50 to 60 diastolic, pulse 63 beats/ minute. On O/V 02/05/11 [name]'s B/P was 117/71 pulse 63 beats/minute. Patient states has been having dizziness when getting in and out the bed every day for the past 5 days. Patient has an office  return visit on 03/31/11. Patient was offer a sooner O/v, she states will keep the appointment on the 03/31/11. She will be fine. [name].  said she is taken so many medications, maybe her body is trying to adjust to the new medication.

## 2011-03-23 ENCOUNTER — Telehealth: Payer: Self-pay | Admitting: Cardiovascular Disease

## 2011-03-23 DIAGNOSIS — Z79899 Other long term (current) drug therapy: Secondary | ICD-10-CM

## 2011-03-23 DIAGNOSIS — I4891 Unspecified atrial fibrillation: Secondary | ICD-10-CM

## 2011-03-23 NOTE — Telephone Encounter (Signed)
Per pt call, pt has been experiencing cramps in her feet, sometimes disabling her from walking they are so painful. Pt has also been c/o heart skipping beats. Pt said HR was up to 154 and then back down to 110.  Pt has been c/o symptoms since Friday-February 17, 2011. Please return pt call to advise/discuss.

## 2011-03-23 NOTE — Telephone Encounter (Signed)
Pt calling stating her Blood sugar is 305 and feet are cramping really bad, all the way up to pt thigh. Pt wanting to speak with a nurse to be informed as to what to do. Pt HR is currently reading to be 79 bpm.Please return pt call to advise/discuss.  Pt was informed message was sent around as a high priority.

## 2011-03-23 NOTE — Telephone Encounter (Signed)
Spoke with pt, she had various complaints. She is having cramping in her feet and legs. She wonders if due to low potassium, pt will come by the office tomorrow for bmp and mag, since she is taking tikosyn. She will also stop her crestor to see if that in anyway helps with her symptoms. She will follow up with her primary concerning her blood sugars. [name]

## 2011-03-24 ENCOUNTER — Other Ambulatory Visit (INDEPENDENT_AMBULATORY_CARE_PROVIDER_SITE_OTHER): Payer: Self-pay | Admitting: *Deleted

## 2011-03-24 DIAGNOSIS — I4891 Unspecified atrial fibrillation: Secondary | ICD-10-CM

## 2011-03-24 DIAGNOSIS — Z79899 Other long term (current) drug therapy: Secondary | ICD-10-CM

## 2011-03-24 LAB — BASIC METABOLIC PANEL
BUN: 17 mg/dL (ref 6–23)
Creatinine, Ser: 1.4 mg/dL — ABNORMAL HIGH (ref 0.4–1.2)
Potassium: 3.8 mEq/L (ref 3.5–5.1)
Sodium: 140 mEq/L (ref 135–145)

## 2011-03-31 ENCOUNTER — Encounter: Payer: Self-pay | Admitting: Cardiovascular Disease

## 2011-03-31 ENCOUNTER — Ambulatory Visit (INDEPENDENT_AMBULATORY_CARE_PROVIDER_SITE_OTHER): Payer: Self-pay | Admitting: Cardiovascular Disease

## 2011-03-31 DIAGNOSIS — E785 Hyperlipidemia, unspecified: Secondary | ICD-10-CM

## 2011-03-31 DIAGNOSIS — I10 Essential (primary) hypertension: Secondary | ICD-10-CM

## 2011-03-31 DIAGNOSIS — I4891 Unspecified atrial fibrillation: Secondary | ICD-10-CM

## 2011-03-31 DIAGNOSIS — R079 Chest pain, unspecified: Secondary | ICD-10-CM

## 2011-03-31 DIAGNOSIS — E119 Type 2 diabetes mellitus without complications: Secondary | ICD-10-CM

## 2011-03-31 NOTE — Assessment & Plan Note (Signed)
Maint NSR continue [name].  Has patient assistance

## 2011-03-31 NOTE — Progress Notes (Signed)
[name] is a 60 yo female with a h/o diabetes, hypertension, hyperlipidemia and asthma who was admitted September 05 2009 with chest pain, syncope and shortness of breath. She was noted to be in atrial fibrillation with rapid ventricular rate. Rate control medications were started. She was placed on warfarin due to her high stroke risk. Echocardiogram demonstrated an EF of 55-60% and moderate LVH. She eventually converted to normal sinus rhythm. Had normal myovue 2/12. Subsequently admitted to the hospital with recurrent afib and started on [name]. Patient assistance forms have been filed and she has been getting samples from our office. BP has been borderline. Encouraged her to F/U for her sleep study both in terms of BP control and decreasing recurrences of PAF. Can change losartan to hyzaar in future if needed  HbA1c still over 8 and insulin being adjusted by Dr. Oren Bracket at [facility]  Subsequent cath 5/12 by Dr Alice Reichert with normal cors   ROS: Denies fever, malais, weight loss, blurry vision, decreased visual acuity, cough, sputum, SOB, hemoptysis, pleuritic pain, palpitaitons, heartburn, abdominal pain, melena, lower extremity edema, claudication, or rash.  All other systems reviewed and negative  General: Affect appropriate Healthy:  appears stated age HEENT: normal Neck supple with no adenopathy JVP normal no bruits no thyromegaly Lungs clear with no wheezing and good diaphragmatic motion Heart:  S1/S2 no murmur,rub, gallop or click PMI normal Abdomen: benighn, BS positve, no tenderness, no AAA no bruit.  No HSM or HJR Distal pulses intact with no bruits No edema Neuro non-focal Skin warm and dry No muscular weakness   Current Outpatient Prescriptions  Medication Sig Dispense Refill  . albuterol (PROVENTIL) (2.5 MG/3ML) 0.083% nebulizer solution Take 2.5 mg by nebulization every 6 (six) hours as needed.        . Ascorbic Acid (VITAMIN C) 500 MG tablet Take 500 mg by  mouth daily.        . Calcium Carb-Cholecalciferol (OYSTER SHELL CALCIUM PLUS D) 500-125 MG-UNIT TABS 1 tab po qd       . Cranberry 405 MG CAPS Take by mouth. 1 po bid       . diltiazem (CARDIZEM CD) 180 MG 24 hr capsule Take 180 mg by mouth daily.        Marland Kitchen dofetilide ([name]) 250 MCG capsule Take 250 mcg by mouth 2 (two) times daily.        . ferrous sulfate 325 (65 FE) MG EC tablet Take 325 mg by mouth daily with breakfast.        . fluticasone (FLOVENT HFA) 110 MCG/ACT inhaler Inhale 1 puff into the lungs 2 (two) times daily.        . hydroxypropyl methylcellulose (ISOPTO TEARS) 2.5 % ophthalmic solution Place 1 drop into both eyes 3 (three) times daily as needed.        . insulin aspart (NOVOLOG) 100 UNIT/ML injection 6 units tid      . insulin glargine (LANTUS) 100 UNIT/ML injection Inject into the skin. 22 units daily      . levocetirizine (XYZAL) 5 MG tablet Take 5 mg by mouth as needed.        Marland Kitchen losartan (COZAAR) 100 MG tablet Take 100 mg by mouth daily.        . metFORMIN (GLUMETZA) 500 MG (MOD) 24 hr tablet Take 500 mg by mouth 2 (two) times daily with a meal.        . Naphazoline-Pheniramine (OPCON-A) 0.027-0.315 % SOLN Apply to eye as needed.        Marland Kitchen  niacin (NIASPAN) 500 MG CR tablet Take 500 mg by mouth at bedtime.        . nitroGLYCERIN (NITROSTAT) 0.4 MG SL tablet Place 0.4 mg under the tongue every 5 (five) minutes as needed.        . Omega-3 Fatty Acids (FISH OIL) 1000 MG CAPS 1 po bid      . pantoprazole (PROTONIX) 40 MG tablet Take 40 mg by mouth daily.        . rizatriptan (MAXALT) 10 MG tablet Take 10 mg by mouth as needed. May repeat in 2 hours if needed       . rosuvastatin (CRESTOR) 10 MG tablet Take 10 mg by mouth daily.        . temazepam (RESTORIL) 15 MG capsule Take 15 mg by mouth at bedtime.        . traMADol (ULTRAM) 50 MG tablet Take 50 mg by mouth every 6 (six) hours as needed.        . triamcinolone (NASACORT) 55 MCG/ACT nasal inhaler 2 sprays by Nasal route  daily.        Marland Kitchen warfarin (COUMADIN) 5 MG tablet 5 mg. Use as directed         Allergies  Hydromorphone hcl  Electrocardiogram:  Assessment and Plan

## 2011-03-31 NOTE — Assessment & Plan Note (Signed)
Diet ok Does not eat out much. F/U [name] for HbA1c

## 2011-03-31 NOTE — Assessment & Plan Note (Signed)
Noncrecurrent  Normal cath 5/12  observe

## 2011-03-31 NOTE — Assessment & Plan Note (Signed)
Cholesterol is at goal.  Continue current dose of statin and diet Rx.  No myalgias or side effects.  F/U  LFT's in 6 months. Lab Results  Component Value Date   LDLCALC  Value: UNABLE TO CALCULATE IF TRIGLYCERIDE OVER 400 mg/dL        Total Cholesterol/HDL:CHD Risk Coronary Heart Disease Risk Table                     Men   Women  1/2 Average Risk   3.4   3.3  Average Risk       5.0   4.4  2 X Average Risk   9.6   7.1  3 X Average Risk  23.4   11.0        Use the calculated Patient Ratio above and the CHD Risk Table to determine the patient's CHD Risk.        ATP III CLASSIFICATION (LDL):  <100     mg/dL   Optimal  100-129  mg/dL   Near or Above                    Optimal  130-159  mg/dL   Borderline  160-189  mg/dL   High  >190     mg/dL   Very High 09/08/2010             

## 2011-03-31 NOTE — Assessment & Plan Note (Signed)
Well controlled.  Continue current medications and low sodium Dash type diet.    

## 2011-03-31 NOTE — Patient Instructions (Signed)
Your physician recommends that you schedule a follow-up appointment in: 6 months with Dr. Nishan 

## 2011-05-08 ENCOUNTER — Other Ambulatory Visit (HOSPITAL_COMMUNITY): Payer: Self-pay | Admitting: Family Medicine

## 2011-05-08 DIAGNOSIS — Z1231 Encounter for screening mammogram for malignant neoplasm of breast: Secondary | ICD-10-CM

## 2011-05-08 LAB — I-STAT 8, (EC8 V) (CONVERTED LAB)
Acid-base deficit: 2
Bicarbonate: 24.5 — ABNORMAL HIGH
Glucose, Bld: 229 — ABNORMAL HIGH
Hemoglobin: 14.3
Sodium: 137
TCO2: 26
pH, Ven: 7.343 — ABNORMAL HIGH

## 2011-05-08 LAB — DIFFERENTIAL
Basophils Absolute: 0
Basophils Relative: 1
Basophils Relative: 1
Eosinophils Absolute: 0.1
Lymphs Abs: 1.9
Monocytes Relative: 8
Monocytes Relative: 8
Neutro Abs: 2.3
Neutro Abs: 2.1
Neutrophils Relative %: 50
Neutrophils Relative %: 45

## 2011-05-08 LAB — LIPID PANEL
Cholesterol: 200
HDL: 28 — ABNORMAL LOW
Total CHOL/HDL Ratio: 7.1
VLDL: UNDETERMINED

## 2011-05-08 LAB — CK TOTAL AND CKMB (NOT AT ARMC)
CK, MB: 1.9
CK, MB: 1.9
CK, MB: 2.5
Relative Index: 0.8
Relative Index: 0.9
Relative Index: 1
Total CK: 189 — ABNORMAL HIGH
Total CK: 216 — ABNORMAL HIGH
Total CK: 309 — ABNORMAL HIGH

## 2011-05-08 LAB — COMPREHENSIVE METABOLIC PANEL
BUN: 14
Calcium: 10.1
Glucose, Bld: 169 — ABNORMAL HIGH
Total Protein: 8.3

## 2011-05-08 LAB — CBC
HCT: 38.1
HCT: 41.4
Hemoglobin: 13.3
Hemoglobin: 14.1
MCHC: 34.2
MCHC: 34.9
MCHC: 34.5
MCV: 85
MCV: 86.1
Platelets: 167
RBC: 4.48
RBC: 4.35
RDW: 13.4
RDW: 13.6
RDW: 13.5
WBC: 3.9 — ABNORMAL LOW

## 2011-05-08 LAB — TROPONIN I: Troponin I: 0.02

## 2011-05-08 LAB — BASIC METABOLIC PANEL
BUN: 12
Calcium: 9.2
Creatinine, Ser: 1.16
GFR calc non Af Amer: 49 — ABNORMAL LOW
Potassium: 4

## 2011-05-08 LAB — POCT CARDIAC MARKERS
CKMB, poc: 3.4
Myoglobin, poc: 164
Myoglobin, poc: 85.5
Operator id: 151321
Troponin i, poc: <0.05

## 2011-05-08 LAB — D-DIMER, QUANTITATIVE: D-Dimer, Quant: 0.24

## 2011-05-08 LAB — TSH: TSH: 1.341

## 2011-05-08 LAB — PROTIME-INR
INR: 0.9
Prothrombin Time: 12.4

## 2011-05-08 LAB — APTT: aPTT: 29

## 2011-05-13 ENCOUNTER — Telehealth: Payer: Self-pay | Admitting: Cardiovascular Disease

## 2011-05-13 NOTE — Telephone Encounter (Signed)
Pt is on last bottle of tikosyn please order for her

## 2011-05-13 NOTE — Telephone Encounter (Signed)
Spoke with pt, will get tikosyn ordered tomorrow when back in [employer]

## 2011-05-14 NOTE — Telephone Encounter (Signed)
tikosyn .250 mg ordered from [facility], order # [id number]. [name]

## 2011-05-15 LAB — RAPID STREP SCREEN (MED CTR MEBANE ONLY): Streptococcus, Group A Screen (Direct): NEGATIVE

## 2011-05-19 ENCOUNTER — Emergency Department (HOSPITAL_COMMUNITY): Payer: Self-pay

## 2011-05-19 ENCOUNTER — Inpatient Hospital Stay (INDEPENDENT_AMBULATORY_CARE_PROVIDER_SITE_OTHER)
Admission: RE | Admit: 2011-05-19 | Discharge: 2011-05-19 | Disposition: A | Discharge status: Home / Self Care | Payer: Self-pay | Source: Ambulatory Visit | Attending: Family Medicine | Admitting: Family Medicine

## 2011-05-19 ENCOUNTER — Emergency Department (HOSPITAL_COMMUNITY)
Admission: EM | Admit: 2011-05-19 | Discharge: 2011-05-20 | Disposition: A | Discharge status: Home / Self Care | Payer: Self-pay | Attending: Emergency Medicine | Admitting: Emergency Medicine

## 2011-05-19 DIAGNOSIS — R109 Unspecified abdominal pain: Secondary | ICD-10-CM

## 2011-05-19 DIAGNOSIS — Z79899 Other long term (current) drug therapy: Secondary | ICD-10-CM | POA: Insufficient documentation

## 2011-05-19 DIAGNOSIS — R1031 Right lower quadrant pain: Secondary | ICD-10-CM | POA: Insufficient documentation

## 2011-05-19 DIAGNOSIS — M25559 Pain in unspecified hip: Secondary | ICD-10-CM | POA: Insufficient documentation

## 2011-05-19 DIAGNOSIS — Z7901 Long term (current) use of anticoagulants: Secondary | ICD-10-CM | POA: Insufficient documentation

## 2011-05-19 DIAGNOSIS — I4891 Unspecified atrial fibrillation: Secondary | ICD-10-CM | POA: Insufficient documentation

## 2011-05-19 DIAGNOSIS — Z794 Long term (current) use of insulin: Secondary | ICD-10-CM | POA: Insufficient documentation

## 2011-05-19 DIAGNOSIS — I10 Essential (primary) hypertension: Secondary | ICD-10-CM | POA: Insufficient documentation

## 2011-05-19 DIAGNOSIS — R630 Anorexia: Secondary | ICD-10-CM | POA: Insufficient documentation

## 2011-05-19 DIAGNOSIS — E119 Type 2 diabetes mellitus without complications: Secondary | ICD-10-CM | POA: Insufficient documentation

## 2011-05-19 DIAGNOSIS — R110 Nausea: Secondary | ICD-10-CM | POA: Insufficient documentation

## 2011-05-19 DIAGNOSIS — E669 Obesity, unspecified: Secondary | ICD-10-CM | POA: Insufficient documentation

## 2011-05-19 LAB — URINE MICROSCOPIC-ADD ON

## 2011-05-19 LAB — COMPREHENSIVE METABOLIC PANEL
ALT: 24 U/L (ref 0–35)
AST: 22
AST: 27 U/L (ref 0–37)
Albumin: 3.9
Albumin: 4.3 g/dL (ref 3.5–5.2)
Alkaline Phosphatase: 59
Alkaline Phosphatase: 77 U/L (ref 39–117)
BUN: 19
Chloride: 102 mEq/L (ref 96–112)
GFR calc Af Amer: 50 — ABNORMAL LOW
Potassium: 4.1
Potassium: 3.4 mEq/L — ABNORMAL LOW (ref 3.5–5.1)
Sodium: 145
Sodium: 141 mEq/L (ref 135–145)
Total Bilirubin: 0.3 mg/dL (ref 0.3–1.2)
Total Protein: 7.3
Total Protein: 8.2 g/dL (ref 6.0–8.3)

## 2011-05-19 LAB — CBC
HCT: 37.1
MCHC: 33.8 g/dL (ref 30.0–36.0)
Platelets: 182
RDW: 13.8
RDW: 14.7 % (ref 11.5–15.5)

## 2011-05-19 LAB — DIFFERENTIAL
Basophils Absolute: 0 10*3/uL (ref 0.0–0.1)
Basophils Relative: 0
Basophils Relative: 1 % (ref 0–1)
Eosinophils Relative: 2 % (ref 0–5)
Monocytes Absolute: 0.3
Monocytes Absolute: 0.4 10*3/uL (ref 0.1–1.0)
Monocytes Relative: 7
Neutro Abs: 3.7

## 2011-05-19 LAB — GLUCOSE, CAPILLARY

## 2011-05-19 LAB — URINALYSIS, ROUTINE W REFLEX MICROSCOPIC
Hgb urine dipstick: NEGATIVE
Nitrite: NEGATIVE
Nitrite: NEGATIVE
Protein, ur: NEGATIVE
Specific Gravity, Urine: 1.016 (ref 1.005–1.030)
Urobilinogen, UA: 0.2
Urobilinogen, UA: 0.2 mg/dL (ref 0.0–1.0)

## 2011-05-19 LAB — POCT URINALYSIS DIP (DEVICE)
Glucose, UA: NEGATIVE mg/dL
Leukocytes, UA: NEGATIVE
Nitrite: NEGATIVE
Protein, ur: NEGATIVE mg/dL
Urobilinogen, UA: 0.2 mg/dL (ref 0.0–1.0)

## 2011-05-19 LAB — APTT: aPTT: 40 seconds — ABNORMAL HIGH (ref 24–37)

## 2011-05-19 LAB — PROTIME-INR
INR: 1.96 — ABNORMAL HIGH (ref 0.00–1.49)
Prothrombin Time: 22.7 seconds — ABNORMAL HIGH (ref 11.6–15.2)

## 2011-05-19 LAB — LIPASE, BLOOD: Lipase: 98 U/L — ABNORMAL HIGH (ref 11–59)

## 2011-05-19 MED ORDER — IOHEXOL 300 MG/ML  SOLN
95.0000 mL | Freq: Once | INTRAMUSCULAR | Status: AC | PRN
  Administered 2011-05-19: 95 mL via INTRAVENOUS

## 2011-05-20 LAB — URINE CULTURE
Colony Count: NO GROWTH
Culture  Setup Time: 201210022102
Culture: NO GROWTH

## 2011-05-27 ENCOUNTER — Telehealth: Payer: Self-pay | Admitting: *Deleted

## 2011-05-27 NOTE — Telephone Encounter (Signed)
Pt aware tikosyn at the front desk for pick up [name]

## 2011-06-05 ENCOUNTER — Ambulatory Visit (HOSPITAL_COMMUNITY)
Admission: RE | Admit: 2011-06-05 | Discharge: 2011-06-05 | Disposition: A | Discharge status: Home / Self Care | Payer: Self-pay | Source: Ambulatory Visit | Attending: Family Medicine | Admitting: Family Medicine

## 2011-06-05 DIAGNOSIS — Z1231 Encounter for screening mammogram for malignant neoplasm of breast: Secondary | ICD-10-CM | POA: Insufficient documentation

## 2011-06-23 ENCOUNTER — Telehealth: Payer: Self-pay | Admitting: Cardiovascular Disease

## 2011-06-23 NOTE — Telephone Encounter (Signed)
Should F/U for BP check and ECG to make sure she is not in afib.  Symptoms should not be from heart.  Normal EF and normal cors

## 2011-06-23 NOTE — Telephone Encounter (Signed)
LMTCB ./CY 

## 2011-06-23 NOTE — Telephone Encounter (Signed)
Pt called she said she is feeling some chest discomfort no pain, dizzy and legs are swelling please call

## 2011-06-23 NOTE — Telephone Encounter (Addendum)
SPOKE WITH PT RE SYMPTOMS PER PT HAS BEEN NOTED SINCE  THE WEEKEND   SOB, CHEST HEAVINESS (NORM COR IN 5-12  PER CATH )NUMBNESS,TINGLING,AND EDEMA TO LOWER EXTREMITIES ALSO C/O  BURNING  INSIDE  BODY ALL OVER   ON/OFF  FOR 2-3 DAYS. PT AWARE WILL FORWARD TO DR [name] FOR REVIEW .Zack Seal

## 2011-06-24 NOTE — Telephone Encounter (Signed)
SPOKE WITH PT MADE APPT WITH [facility] YESTERDAY AND WAS SEEN WAS DX WITH BRONCHITIS TREATMENT INITIATED  INSTRUCTED IF NO IMPROVEMENT  OR INCREASE IN S/S WILL CALL BACK .Zack Seal

## 2011-06-24 NOTE — Telephone Encounter (Signed)
Pt has called back.. Please call [phone]

## 2011-07-02 ENCOUNTER — Telehealth: Payer: Self-pay | Admitting: Cardiovascular Disease

## 2011-07-02 NOTE — Telephone Encounter (Signed)
Pt called. She said she went to primary Dr and took all her antibiotics for chest congestion She said she her heart rate goes up to 111 and goes back down. She has been having chest pain at night and goes away. She wants to talk to you

## 2011-07-02 NOTE — Telephone Encounter (Signed)
Per Dr Myrtis Ser, pt to see Dr [name] within a week.

## 2011-07-02 NOTE — Telephone Encounter (Signed)
Pt calling to report that she is having a dull,  burning off and on in her left arm and mid chest x one week day and night.   She states she was on amoxicillin Tuesday am and originally thought it was from the bronchitis.  The burning pain is worse when she moves.  She has cts and always has numbness and tingling in her hand.  She also has a rapid heart rate off and on which causes the burning to be worse.  Pulse is in the 80's to the low hundreds.  When she changes position or props up the pain improves.  She states the pain is not "bad enough to take nitro" (5/10).  She states she took nitro last week with relief of the pain.  She describes the pain as being like if someone hit her in the shoulder or a muscle strain.  BP today was 151/74 which she states is high for her (systolic).  She also has been having sob and sweats and chills off and on.

## 2011-07-03 ENCOUNTER — Other Ambulatory Visit: Payer: Self-pay | Admitting: Cardiovascular Disease

## 2011-07-03 MED ORDER — NITROGLYCERIN 0.4 MG SL SUBL: 0.4000 mg | SUBLINGUAL_TABLET | SUBLINGUAL | Status: DC | PRN

## 2011-07-10 ENCOUNTER — Ambulatory Visit (INDEPENDENT_AMBULATORY_CARE_PROVIDER_SITE_OTHER): Payer: Self-pay | Admitting: Cardiovascular Disease

## 2011-07-10 ENCOUNTER — Encounter: Payer: Self-pay | Admitting: Cardiovascular Disease

## 2011-07-10 DIAGNOSIS — E785 Hyperlipidemia, unspecified: Secondary | ICD-10-CM

## 2011-07-10 DIAGNOSIS — R079 Chest pain, unspecified: Secondary | ICD-10-CM

## 2011-07-10 DIAGNOSIS — I10 Essential (primary) hypertension: Secondary | ICD-10-CM

## 2011-07-10 DIAGNOSIS — E119 Type 2 diabetes mellitus without complications: Secondary | ICD-10-CM

## 2011-07-10 DIAGNOSIS — I4891 Unspecified atrial fibrillation: Secondary | ICD-10-CM

## 2011-07-10 NOTE — Patient Instructions (Signed)
Your physician wants you to follow-up in:  6 MONTHS WITH DR Octavia Bruckner will receive a reminder letter in the mail two months in advance. If you don't receive a letter, please call our office to schedule the follow-up appointment. Your physician has recommended you make the following change in your medication: STOP COUMADIN

## 2011-07-10 NOTE — Assessment & Plan Note (Signed)
Noncardiac.  Normal cath 5/12 and normal ECG  F/U Oren Bracket [facility] recent Rx bronchitis and ? GERD

## 2011-07-10 NOTE — Progress Notes (Signed)
Patient ID: [name], female   DOB: 13-May-1951, 60 y.o.   MRN: [id number]

## 2011-07-10 NOTE — Progress Notes (Addendum)
[name] is a 60 yo female with a h/o diabetes, hypertension, hyperlipidemia and asthma who was admitted September 05 2009 with chest pain, syncope and shortness of breath. She was noted to be in atrial fibrillation with rapid ventricular rate. Rate control medications were started. She was placed on warfarin due to her high stroke risk. Echocardiogram demonstrated an EF of 55-60% and moderate LVH. She eventually converted to normal sinus rhythm. Had normal myovue 2/12. Subsequently admitted to the hospital with recurrent afib and started on [name]. Patient assistance forms have been filed and she has been getting samples from our office. BP has been borderline. Encouraged her to F/U for her sleep study both in terms of BP control and decreasing recurrences of PAF. Can change losartan to hyzaar in future if needed  HbA1c still over 8 and insulin being adjusted by Dr. Oren Bracket at [facility]  Subsequent cath 5/12 by Dr Alice Reichert with normal cors  Atypical pain last two weeks.  Nonexertional Recent Rx for bronchitis with [facility]acillin [facility] at [facility].  Also ? GERD.  Does not need any further cardiac w/u for pain with normal cath less than 6 months ago and ECG normal in office today  Maint NSR on [name]  Will stop coumadin  ROS: Denies fever, malais, weight loss, blurry vision, decreased visual acuity, cough, sputum, SOB, hemoptysis, pleuritic pain, palpitaitons, heartburn, abdominal pain, melena, lower extremity edema, claudication, or rash.  All other systems reviewed and negative  General: Affect appropriate Healthy:  appears stated age HEENT: normal Neck supple with no adenopathy JVP normal no bruits no thyromegaly Lungs clear with no wheezing and good diaphragmatic motion Heart:  S1/S2 no murmur,rub, gallop or click PMI normal Abdomen: benighn, BS positve, no tenderness, no AAA no bruit.  No HSM or HJR Distal pulses intact with no bruits No edema Neuro non-focal Skin warm  and dry No muscular weakness   Current Outpatient Prescriptions  Medication Sig Dispense Refill  . albuterol (PROVENTIL) (2.5 MG/3ML) 0.083% nebulizer solution Take 2.5 mg by nebulization every 6 (six) hours as needed.        . Ascorbic Acid (VITAMIN C) 500 MG tablet Take 500 mg by mouth daily.        . Calcium Carb-Cholecalciferol (OYSTER SHELL CALCIUM PLUS D) 500-125 MG-UNIT TABS 1 tab po qd       . Cranberry 405 MG CAPS Take by mouth. 1 po bid       . diltiazem (CARDIZEM CD) 180 MG 24 hr capsule Take 180 mg by mouth daily.        Marland Kitchen dofetilide ([name]) 250 MCG capsule Take 250 mcg by mouth 2 (two) times daily.        . ferrous sulfate 325 (65 FE) MG EC tablet Take 325 mg by mouth daily with breakfast.        . fluticasone (FLOVENT HFA) 110 MCG/ACT inhaler Inhale 1 puff into the lungs 2 (two) times daily.        . hydroxypropyl methylcellulose (ISOPTO TEARS) 2.5 % ophthalmic solution Place 1 drop into both eyes 3 (three) times daily as needed.        . insulin aspart (NOVOLOG) 100 UNIT/ML injection 6 units tid      . insulin glargine (LANTUS) 100 UNIT/ML injection Inject into the skin. 22 units daily      . levocetirizine (XYZAL) 5 MG tablet Take 5 mg by mouth as needed.        Marland Kitchen losartan (COZAAR) 100  MG tablet Take 100 mg by mouth daily.        . metFORMIN (GLUMETZA) 500 MG (MOD) 24 hr tablet Take 500 mg by mouth 2 (two) times daily with a meal.        . Naphazoline-Pheniramine (OPCON-A) 0.027-0.315 % SOLN Apply to eye as needed.        . niacin (NIASPAN) 500 MG CR tablet Take 500 mg by mouth at bedtime.        . nitroGLYCERIN (NITROSTAT) 0.4 MG SL tablet Place 1 tablet (0.4 mg total) under the tongue every 5 (five) minutes as needed.  25 tablet  11  . Omega-3 Fatty Acids (FISH OIL) 1000 MG CAPS 1 po bid      . pantoprazole (PROTONIX) 40 MG tablet Take 40 mg by mouth daily.        . rizatriptan (MAXALT) 10 MG tablet Take 10 mg by mouth as needed. May repeat in 2 hours if needed       .  rosuvastatin (CRESTOR) 10 MG tablet Take 10 mg by mouth daily.        . temazepam (RESTORIL) 15 MG capsule Take 15 mg by mouth at bedtime.        . traMADol (ULTRAM) 50 MG tablet Take 50 mg by mouth every 6 (six) hours as needed.        . triamcinolone (NASACORT) 55 MCG/ACT nasal inhaler 2 sprays by Nasal route daily.        Marland Kitchen warfarin (COUMADIN) 5 MG tablet 5 mg. Use as directed         Allergies  Hydromorphone hcl  Electrocardiogram:  NSR 73 normal ECG QT 430 QRS 88 msec  Assessment and Plan

## 2011-07-10 NOTE — Assessment & Plan Note (Signed)
Discussed low carb diet.  Target hemoglobin A1c is 6.5 or less.  Continue current medications.  

## 2011-07-10 NOTE — Assessment & Plan Note (Signed)
Well controlled.  Continue current medications and low sodium Dash type diet.    

## 2011-07-10 NOTE — Assessment & Plan Note (Signed)
Maint NSR on [name].  Stop coumadin and start ASA

## 2011-07-10 NOTE — Assessment & Plan Note (Signed)
Cholesterol is at goal.  Continue current dose of statin and diet Rx.  No myalgias or side effects.  F/U  LFT's in 6 months. Lab Results  Component Value Date   LDLCALC  Value: UNABLE TO CALCULATE IF TRIGLYCERIDE OVER 400 mg/dL        Total Cholesterol/HDL:CHD Risk Coronary Heart Disease Risk Table                     Men   Women  1/2 Average Risk   3.4   3.3  Average Risk       5.0   4.4  2 X Average Risk   9.6   7.1  3 X Average Risk  23.4   11.0        Use the calculated Patient Ratio above and the CHD Risk Table to determine the patient's CHD Risk.        ATP III CLASSIFICATION (LDL):  <100     mg/dL   Optimal  100-129  mg/dL   Near or Above                    Optimal  130-159  mg/dL   Borderline  160-189  mg/dL   High  >190     mg/dL   Very High 09/08/2010             

## 2011-07-24 ENCOUNTER — Emergency Department (HOSPITAL_COMMUNITY)
Admission: EM | Admit: 2011-07-24 | Discharge: 2011-07-24 | Disposition: A | Discharge status: Other Acute Inpatient Hospital | Payer: Self-pay | Source: Home / Self Care | Attending: Family Medicine | Admitting: Family Medicine

## 2011-07-24 ENCOUNTER — Encounter (HOSPITAL_COMMUNITY): Payer: Self-pay | Admitting: Emergency Medicine

## 2011-07-24 ENCOUNTER — Emergency Department (INDEPENDENT_AMBULATORY_CARE_PROVIDER_SITE_OTHER): Payer: Self-pay

## 2011-07-24 DIAGNOSIS — J019 Acute sinusitis, unspecified: Secondary | ICD-10-CM

## 2011-07-24 HISTORY — DX: Unspecified osteoarthritis, unspecified site: M19.90

## 2011-07-24 MED ORDER — AMOXICILLIN-POT CLAVULANATE 875-125 MG PO TABS: 1.0000 | ORAL_TABLET | Freq: Two times a day (BID) | ORAL | Status: AC

## 2011-07-24 NOTE — ED Notes (Signed)
Pt c/o cough and congestion getting really bad last night, had dyspnea. Inhalers not helping. Had Amoxicillin from PCP starting on 26th, things got better for a little bit then got worse. C/o severe headache.

## 2011-07-24 NOTE — ED Provider Notes (Signed)
History     CSN: [id number] Arrival date & time: 07/24/2011  1:44 PM   First MD Initiated Contact with Patient 07/24/11 1357      Chief Complaint  Patient presents with  . Cough    (Consider location/radiation/quality/duration/timing/severity/associated sxs/prior treatment) Patient is a 60 y.o. female presenting with cough. The history is provided by the patient.  Cough This is a new problem. The current episode started more than 1 week ago (treated with amox for 1 week on 11/26, cough getting worse.). The problem has been gradually worsening. The cough is productive of purulent sputum. The maximum temperature recorded prior to her arrival was 100 to 100.9 F. Associated symptoms include rhinorrhea, sore throat and shortness of breath. She is not a smoker.    Past Medical History  Diagnosis Date  . Diabetes mellitus     type 2  . Hypertension   . Hyperlipidemia   . Paroxysmal a-fib     coumadin ( Chads2=2) Echo 09/05/10 EF 55-60%; mod LVH  . LVH (left ventricular hypertrophy)   . Asthma   . Arthritis     Past Surgical History  Procedure Date  . Left ankle fracture     closed reduction March 2008  . Wrist arthroscopy     June 2003  . Carpal tunnel release     left  . Subcutaneous transposition of left ulnar nerve   . Abdominal hysterectomy   . Cholecystectomy     Family History  Problem Relation Age of Onset  . Heart attack Mother     @ age 33  . Heart failure Mother   . Heart attack Brother     @ age 67    History  Substance Use Topics  . Smoking status: Never Smoker   . Smokeless tobacco: Not on file  . Alcohol Use: No    OB History    Grav Para Term Preterm Abortions TAB SAB Ect Mult Living                  Review of Systems  Constitutional: Negative.   HENT: Positive for sore throat, rhinorrhea and postnasal drip.   Respiratory: Positive for cough and shortness of breath.   Cardiovascular: Negative.   Gastrointestinal: Negative.      Allergies  Hydromorphone hcl  Home Medications   Current Outpatient Rx  Name Route Sig Dispense Refill  . VITAMIN C 500 MG PO TABS Oral Take 500 mg by mouth daily.      Marland Kitchen CALCIUM CARB-CHOLECALCIFEROL 500-125 MG-UNIT PO TABS  1 tab po qd     . CRANBERRY 405 MG PO CAPS Oral Take by mouth. 1 po bid     . DEXLANSOPRAZOLE 60 MG PO CPDR Oral Take 60 mg by mouth daily.      Marland Kitchen DILTIAZEM HCL ER COATED BEADS 180 MG PO CP24 Oral Take 180 mg by mouth daily.      . DOFETILIDE 250 MCG PO CAPS Oral Take 250 mcg by mouth 2 (two) times daily.      Marland Kitchen FERROUS SULFATE 325 (65 FE) MG PO TBEC Oral Take 325 mg by mouth daily with breakfast.      . FLUTICASONE PROPIONATE  HFA 110 MCG/ACT IN AERO Inhalation Inhale 1 puff into the lungs 2 (two) times daily.      Marland Kitchen HYPROMELLOSE 2.5 % OP SOLN Both Eyes Place 1 drop into both eyes 3 (three) times daily as needed.      . INSULIN ASPART  100 UNIT/ML [place] SOLN  6 units tid    . INSULIN GLARGINE 100 UNIT/ML [place] SOLN Subcutaneous Inject into the skin. 22 units daily    . LEVOCETIRIZINE DIHYDROCHLORIDE 5 MG PO TABS Oral Take 5 mg by mouth as needed.      Marland Kitchen LOSARTAN POTASSIUM 100 MG PO TABS Oral Take 100 mg by mouth daily.      Marland Kitchen METFORMIN HCL ER (MOD) 500 MG PO TB24 Oral Take 500 mg by mouth 2 (two) times daily with a meal.      . NAPHAZOLINE-PHENIRAMINE 0.027-0.315 % OP SOLN Ophthalmic Apply to eye as needed.      Marland Kitchen NIACIN (ANTIHYPERLIPIDEMIC) 500 MG PO TBCR Oral Take 500 mg by mouth at bedtime.      Marland Kitchen NITROGLYCERIN 0.4 MG SL SUBL Sublingual Place 1 tablet (0.4 mg total) under the tongue every 5 (five) minutes as needed. 25 tablet 11  . FISH OIL 1000 MG PO CAPS  1 po bid    . PANTOPRAZOLE SODIUM 40 MG PO TBEC Oral Take 40 mg by mouth daily.      Marland Kitchen RIZATRIPTAN BENZOATE 10 MG PO TABS Oral Take 10 mg by mouth as needed. May repeat in 2 hours if needed     . ROSUVASTATIN CALCIUM 10 MG PO TABS Oral Take 10 mg by mouth daily.      Marland Kitchen TEMAZEPAM 15 MG PO CAPS Oral Take 15 mg by  mouth at bedtime.      . TRIAMCINOLONE ACETONIDE 55 MCG/ACT NA INHA Nasal 2 sprays by Nasal route daily.      . ALBUTEROL SULFATE (2.5 MG/3ML) 0.083% IN NEBU Nebulization Take 2.5 mg by nebulization every 6 (six) hours as needed.      . AMOXICILLIN-POT CLAVULANATE 875-125 MG PO TABS Oral Take 1 tablet by mouth 2 (two) times daily. 20 tablet 0  . TRAMADOL HCL 50 MG PO TABS Oral Take 50 mg by mouth every 6 (six) hours as needed.        BP 142/81  Pulse 90  Temp(Src) 100.3 F (37.9 C) (Oral)  Resp 18  SpO2 98%  Physical Exam  Nursing note and vitals reviewed. Constitutional: She appears well-developed and well-nourished.  HENT:  Head: Normocephalic.  Right Ear: External ear normal.  Left Ear: External ear normal.  Mouth/Throat: Oropharynx is clear and moist.  Eyes: Pupils are equal, round, and reactive to light.  Neck: Normal range of motion. Neck supple.  Cardiovascular: Normal rate, normal heart sounds and intact distal pulses.   Pulmonary/Chest: Effort normal. Not tachypneic. She has rhonchi.  Abdominal: Soft. Bowel sounds are normal.  Lymphadenopathy:    She has no cervical adenopathy.    ED Course  Procedures (including critical care time)  Labs Reviewed - No data to display Dg Chest 2 View  07/24/2011  *RADIOLOGY REPORT*  Clinical Data: Cough and fever.  CHEST - 2 VIEW  Comparison: Chest 01/15/2011.  Findings: Lungs are clear.  Heart size is normal.  No pneumothorax or pleural effusion.  IMPRESSION: Negative chest.  Original Report Authenticated By: Bernadene Bell. D'ALESSIO, M.D.     1. Sinusitis acute       MDM  X-rays reviewed and report per radiologist.         Barkley Bruns, MD 07/24/11 1539

## 2011-08-07 ENCOUNTER — Ambulatory Visit (INDEPENDENT_AMBULATORY_CARE_PROVIDER_SITE_OTHER): Payer: PRIVATE HEALTH INSURANCE | Admitting: General Surgery

## 2011-08-07 ENCOUNTER — Ambulatory Visit (INDEPENDENT_AMBULATORY_CARE_PROVIDER_SITE_OTHER): Payer: Self-pay | Admitting: General Surgery

## 2011-08-07 ENCOUNTER — Other Ambulatory Visit (INDEPENDENT_AMBULATORY_CARE_PROVIDER_SITE_OTHER): Payer: Self-pay

## 2011-08-07 ENCOUNTER — Encounter (INDEPENDENT_AMBULATORY_CARE_PROVIDER_SITE_OTHER): Payer: Self-pay | Admitting: General Surgery

## 2011-08-07 DIAGNOSIS — K2100 Gastro-esophageal reflux disease with esophagitis, without bleeding: Secondary | ICD-10-CM

## 2011-08-07 DIAGNOSIS — K449 Diaphragmatic hernia without obstruction or gangrene: Secondary | ICD-10-CM

## 2011-08-07 DIAGNOSIS — R1310 Dysphagia, unspecified: Secondary | ICD-10-CM

## 2011-08-07 NOTE — Progress Notes (Signed)
Patient ID: [name], female   DOB: November 04, 1950, 60 y.o.   MRN: [id number]  Chief Complaint  Patient presents with  . Hiatal Hernia    HPI [name] is a 60 y.o. female. This patient was referred by Dr. Sherren Mocha for evaluation of a possible hiatal hernia. She states that she was diagnosed in 2002 with hernia by Dr. Corinda Gubler by [facility]. She complains of regurgitation and reflux and she is currently taking Protonix without any significant relief. She also has a deepening voice she says that her voice is changed over the last year. She also complains of atypical chest pain which she describes as "punching feeling" in. She also complains of food getting stuck in her throat behind her sternum in she says it liquids caused her to cough. She denies any melena or hematochezia. She was recently changed and the different reflux medication although she has not received a prescription for the medication to this point. HPI  Past Medical History  Diagnosis Date  . Diabetes mellitus     type 2  . Hypertension   . Hyperlipidemia   . Paroxysmal a-fib     coumadin ( Chads2=2) Echo 09/05/10 EF 55-60%; mod LVH  . LVH (left ventricular hypertrophy)   . Asthma   . Arthritis     Past Surgical History  Procedure Date  . Left ankle fracture     closed reduction March 2008  . Wrist arthroscopy     June 2003  . Carpal tunnel release     left  . Subcutaneous transposition of left ulnar nerve   . Abdominal hysterectomy   . Cholecystectomy   . Colon surgery   . Colon cancer     Family History  Problem Relation Age of Onset  . Heart attack Mother     @ age 74  . Heart failure Mother   . Heart attack Brother     @ age 17    Social History History  Substance Use Topics  . Smoking status: Never Smoker   . Smokeless tobacco: Not on file  . Alcohol Use: No    Allergies  Allergen Reactions  . Hydromorphone Hcl     Current Outpatient Prescriptions  Medication Sig Dispense Refill    . albuterol (PROVENTIL) (2.5 MG/3ML) 0.083% nebulizer solution Take 2.5 mg by nebulization every 6 (six) hours as needed.        . Ascorbic Acid (VITAMIN C) 500 MG tablet Take 500 mg by mouth daily.        . Calcium Carb-Cholecalciferol (OYSTER SHELL CALCIUM PLUS D) 500-125 MG-UNIT TABS 1 tab po qd       . Cranberry 405 MG CAPS Take by mouth. 1 po bid       . dexlansoprazole (DEXILANT) 60 MG capsule Take 60 mg by mouth daily.        Marland Kitchen diltiazem (CARDIZEM CD) 180 MG 24 hr capsule Take 180 mg by mouth daily.        Marland Kitchen dofetilide ([name]) 250 MCG capsule Take 250 mcg by mouth 2 (two) times daily.        . ferrous sulfate 325 (65 FE) MG EC tablet Take 325 mg by mouth daily with breakfast.        . fluticasone (FLOVENT HFA) 110 MCG/ACT inhaler Inhale 1 puff into the lungs 2 (two) times daily.        . hydroxypropyl methylcellulose (ISOPTO TEARS) 2.5 % ophthalmic solution Place 1 drop into both  eyes 3 (three) times daily as needed.        . insulin aspart (NOVOLOG) 100 UNIT/ML injection 6 units tid      . insulin glargine (LANTUS) 100 UNIT/ML injection Inject into the skin. 22 units daily      . levocetirizine (XYZAL) 5 MG tablet Take 5 mg by mouth as needed.        Marland Kitchen losartan (COZAAR) 100 MG tablet Take 100 mg by mouth daily.        . metFORMIN (GLUMETZA) 500 MG (MOD) 24 hr tablet Take 500 mg by mouth 2 (two) times daily with a meal.        . Naphazoline-Pheniramine (OPCON-A) 0.027-0.315 % SOLN Apply to eye as needed.        . niacin (NIASPAN) 500 MG CR tablet Take 500 mg by mouth at bedtime.        . nitroGLYCERIN (NITROSTAT) 0.4 MG SL tablet Place 1 tablet (0.4 mg total) under the tongue every 5 (five) minutes as needed.  25 tablet  11  . Omega-3 Fatty Acids (FISH OIL) 1000 MG CAPS 1 po bid      . pantoprazole (PROTONIX) 40 MG tablet Take 40 mg by mouth daily.        . rizatriptan (MAXALT) 10 MG tablet Take 10 mg by mouth as needed. May repeat in 2 hours if needed       . rosuvastatin (CRESTOR)  10 MG tablet Take 10 mg by mouth daily.        . temazepam (RESTORIL) 15 MG capsule Take 15 mg by mouth at bedtime.        . traMADol (ULTRAM) 50 MG tablet Take 50 mg by mouth every 6 (six) hours as needed.        . triamcinolone (NASACORT) 55 MCG/ACT nasal inhaler 2 sprays by Nasal route daily.          Review of Systems Review of Systems All other review of systems negative or noncontributory except as stated in the HPI  Blood pressure 122/80, pulse 70, temperature 98.6 F (37 C), temperature source Temporal, resp. rate 18, height 5\' 7"  (1.702 m), weight 214 lb 11.2 oz (97.387 kg).  Physical Exam Physical Exam  Vitals reviewed. Constitutional: She is oriented to person, place, and time. She appears well-developed and well-nourished. No distress.  HENT:  Head: Normocephalic and atraumatic.  Mouth/Throat: No oropharyngeal exudate.  Eyes: Conjunctivae are normal. Pupils are equal, round, and reactive to light. Right eye exhibits no discharge. Left eye exhibits no discharge. No scleral icterus.  Neck: Normal range of motion. No tracheal deviation present.  Cardiovascular: Normal rate, regular rhythm and normal heart sounds.   Pulmonary/Chest: Effort normal. No stridor. No respiratory distress. She has no wheezes.  Abdominal: Soft. Bowel sounds are normal. She exhibits no distension and no mass. There is no tenderness. There is no rebound and no guarding.  Musculoskeletal: Normal range of motion. She exhibits no edema and no tenderness.  Neurological: She is alert and oriented to person, place, and time.  Skin: Skin is warm and dry. No rash noted. She is not diaphoretic. No erythema. No pallor.  Psychiatric: She has a normal mood and affect. Her behavior is normal. Judgment and thought content normal.    Data Reviewed  Assessment    Dysphagia and chest pain This certainly may be attributed to a possible hiatal hernia although I have no objective evidence to indicate that she does  have a hiatal  hernia. She does have an reflux complaints and symptoms that may be due to her reflux as well.     Plan    I recommended that she take her Protonix twice daily until she gets a new medication that was prescribed by her primary physician. I've also recommended an upper GI evaluation and manometry and she is due for a repeat colonoscopy so I would recommend that she return to her gastroenterologist for repeat colonoscopy prior to any surgical procedure. After we get the upper GI and manometry she will return for repeat evaluation. At that time we will discuss possible hiatal hernia repair if present.       Lodema Pilot DAVID 08/07/2011, 1:07 PM

## 2011-08-12 ENCOUNTER — Encounter: Payer: Self-pay | Admitting: Gastroenterology

## 2011-08-12 ENCOUNTER — Telehealth: Payer: Self-pay | Admitting: Cardiovascular Disease

## 2011-08-12 NOTE — Telephone Encounter (Signed)
New Problem;    PAtient has called to have a 90 day prescription of her dofetilide ([name]) 250 MCG capsule refilled. There is a special condition where this medication has to be special ordered from [facility] And delivered to the practice. [name] used to handle ordering for this patient.

## 2011-08-14 ENCOUNTER — Ambulatory Visit (HOSPITAL_COMMUNITY)
Admission: RE | Admit: 2011-08-14 | Discharge: 2011-08-14 | Disposition: A | Discharge status: Home / Self Care | Payer: Self-pay | Source: Ambulatory Visit | Attending: General Surgery | Admitting: General Surgery

## 2011-08-14 ENCOUNTER — Other Ambulatory Visit (INDEPENDENT_AMBULATORY_CARE_PROVIDER_SITE_OTHER): Payer: Self-pay | Admitting: General Surgery

## 2011-08-14 DIAGNOSIS — R1310 Dysphagia, unspecified: Secondary | ICD-10-CM | POA: Insufficient documentation

## 2011-08-14 DIAGNOSIS — R12 Heartburn: Secondary | ICD-10-CM | POA: Insufficient documentation

## 2011-08-14 DIAGNOSIS — K219 Gastro-esophageal reflux disease without esophagitis: Secondary | ICD-10-CM | POA: Insufficient documentation

## 2011-08-14 DIAGNOSIS — K449 Diaphragmatic hernia without obstruction or gangrene: Secondary | ICD-10-CM | POA: Insufficient documentation

## 2011-08-17 ENCOUNTER — Telehealth: Payer: Self-pay | Admitting: Cardiovascular Disease

## 2011-08-17 NOTE — Telephone Encounter (Signed)
Walk In Pt Form " pt Dropped off Form for [name]" sent to [name]  08/17/11/km

## 2011-08-25 ENCOUNTER — Emergency Department (HOSPITAL_COMMUNITY): Payer: Self-pay

## 2011-08-25 ENCOUNTER — Emergency Department (HOSPITAL_COMMUNITY)
Admission: EM | Admit: 2011-08-25 | Discharge: 2011-08-26 | Disposition: A | Discharge status: Home / Self Care | Payer: Self-pay | Attending: Emergency Medicine | Admitting: Emergency Medicine

## 2011-08-25 ENCOUNTER — Encounter (HOSPITAL_COMMUNITY): Payer: Self-pay | Admitting: Emergency Medicine

## 2011-08-25 DIAGNOSIS — R599 Enlarged lymph nodes, unspecified: Secondary | ICD-10-CM | POA: Insufficient documentation

## 2011-08-25 DIAGNOSIS — R079 Chest pain, unspecified: Secondary | ICD-10-CM | POA: Insufficient documentation

## 2011-08-25 DIAGNOSIS — R070 Pain in throat: Secondary | ICD-10-CM | POA: Insufficient documentation

## 2011-08-25 DIAGNOSIS — E119 Type 2 diabetes mellitus without complications: Secondary | ICD-10-CM | POA: Insufficient documentation

## 2011-08-25 DIAGNOSIS — R059 Cough, unspecified: Secondary | ICD-10-CM | POA: Insufficient documentation

## 2011-08-25 DIAGNOSIS — H9209 Otalgia, unspecified ear: Secondary | ICD-10-CM | POA: Insufficient documentation

## 2011-08-25 DIAGNOSIS — R05 Cough: Secondary | ICD-10-CM | POA: Insufficient documentation

## 2011-08-25 DIAGNOSIS — E785 Hyperlipidemia, unspecified: Secondary | ICD-10-CM | POA: Insufficient documentation

## 2011-08-25 DIAGNOSIS — I10 Essential (primary) hypertension: Secondary | ICD-10-CM | POA: Insufficient documentation

## 2011-08-25 DIAGNOSIS — M129 Arthropathy, unspecified: Secondary | ICD-10-CM | POA: Insufficient documentation

## 2011-08-25 DIAGNOSIS — J45909 Unspecified asthma, uncomplicated: Secondary | ICD-10-CM | POA: Insufficient documentation

## 2011-08-25 DIAGNOSIS — J069 Acute upper respiratory infection, unspecified: Secondary | ICD-10-CM | POA: Insufficient documentation

## 2011-08-25 DIAGNOSIS — Z79899 Other long term (current) drug therapy: Secondary | ICD-10-CM | POA: Insufficient documentation

## 2011-08-25 MED ORDER — GUAIFENESIN ER 1200 MG PO TB12: 1.0000 | ORAL_TABLET | Freq: Two times a day (BID) | ORAL | Status: DC

## 2011-08-25 MED ORDER — PROMETHAZINE-DM 6.25-15 MG/5ML PO SYRP: 5.0000 mL | ORAL_SOLUTION | ORAL | Status: AC | PRN

## 2011-08-25 NOTE — ED Notes (Signed)
Pt alert, nad, c/o cough and congestion, onset past weekekend, seen pcp, returns to ER with cont c/o

## 2011-08-25 NOTE — ED Provider Notes (Signed)
Medical screening examination/treatment/procedure(s) were performed by non-physician practitioner and as supervising physician I was immediately available for consultation/collaboration.   Nat Christen, MD 08/25/11 2352

## 2011-08-25 NOTE — ED Provider Notes (Signed)
History     CSN: [id number]  Arrival date & time 08/25/11  2139   First MD Initiated Contact with Patient 08/25/11 2234      Chief Complaint  Patient presents with  . Cough    green, blood tinged sputum.  Saw PMD yesterday and on abx but feels worse.  no fever since weekend.     HPI Patient presents to ED with cough that began about 1 week ago and worsened over the weekend. She reports fever at home between 99 and 101 orally. She also reports bilateral ear pain, sore throat, and chest tenderness. She describes the cough as productive with 1 episode of bloody-tinged sputum yesterday. She was seen by her PCP yesterday and was treated with azithromycin and tessalon perles for bronchitis. Patient reports no relief with meds prescribed.  Her PMH is significant for sarcoidosis and asthma.  Past Medical History  Diagnosis Date  . Diabetes mellitus     type 2  . Hypertension   . Hyperlipidemia   . Paroxysmal a-fib     coumadin ( Chads2=2) Echo 09/05/10 EF 55-60%; mod LVH  . LVH (left ventricular hypertrophy)   . Asthma   . Arthritis     Past Surgical History  Procedure Date  . Left ankle fracture     closed reduction March 2008  . Wrist arthroscopy     June 2003  . Carpal tunnel release     left  . Subcutaneous transposition of left ulnar nerve   . Abdominal hysterectomy   . Cholecystectomy   . Colon surgery   . Colon cancer     Family History  Problem Relation Age of Onset  . Heart attack Mother     @ age 67  . Heart failure Mother   . Heart attack Brother     @ age 37    History  Substance Use Topics  . Smoking status: Never Smoker   . Smokeless tobacco: Not on file  . Alcohol Use: No    OB History    Grav Para Term Preterm Abortions TAB SAB Ect Mult Living                  Review of Systems All pertinent positives and negatives reviewed in the history of present illness  Allergies  Amitriptyline and Hydromorphone hcl  Home Medications   Current  Outpatient Rx  Name Route Sig Dispense Refill  . ALBUTEROL SULFATE HFA 108 (90 BASE) MCG/ACT IN AERS Inhalation Inhale 2 puffs into the lungs every 6 (six) hours as needed.      Marland Kitchen VITAMIN C 500 MG PO TABS Oral Take 500 mg by mouth daily.      . AZITHROMYCIN 250 MG PO TABS Oral Take 250 mg by mouth daily. Just started taking on 7th of jan     . BENZONATATE 100 MG PO CAPS Oral Take 100 mg by mouth 3 (three) times daily as needed.      Marland Kitchen CALCIUM CARB-CHOLECALCIFEROL 500-125 MG-UNIT PO TABS  1 tab po qd     . CRANBERRY 405 MG PO CAPS Oral Take by mouth. 1 po bid     . DEXLANSOPRAZOLE 60 MG PO CPDR Oral Take 60 mg by mouth daily.      Marland Kitchen DILTIAZEM HCL ER COATED BEADS 180 MG PO CP24 Oral Take 180 mg by mouth daily.      . DOFETILIDE 250 MCG PO CAPS Oral Take 250 mcg by mouth 2 (two) times  daily.      Marland Kitchen FERROUS SULFATE 325 (65 FE) MG PO TBEC Oral Take 325 mg by mouth daily with breakfast.      . FLUTICASONE PROPIONATE  HFA 110 MCG/ACT IN AERO Inhalation Inhale 1 puff into the lungs 2 (two) times daily.      Marland Kitchen HYPROMELLOSE 2.5 % OP SOLN Both Eyes Place 1 drop into both eyes 3 (three) times daily as needed.      . INSULIN ASPART 100 UNIT/ML [place] SOLN  6 units tid    . INSULIN GLARGINE 100 UNIT/ML [place] SOLN Subcutaneous Inject into the skin. 22 units daily    . LEVOCETIRIZINE DIHYDROCHLORIDE 5 MG PO TABS Oral Take 5 mg by mouth as needed.      Marland Kitchen LOSARTAN POTASSIUM 100 MG PO TABS Oral Take 100 mg by mouth daily.      Marland Kitchen METFORMIN HCL ER (MOD) 500 MG PO TB24 Oral Take 500 mg by mouth 2 (two) times daily with a meal.      . NAPHAZOLINE-PHENIRAMINE 0.027-0.315 % OP SOLN Ophthalmic Apply to eye as needed.      Marland Kitchen NIACIN ER (ANTIHYPERLIPIDEMIC) 500 MG PO TBCR Oral Take 500 mg by mouth at bedtime.      Marland Kitchen NITROGLYCERIN 0.4 MG SL SUBL Sublingual Place 1 tablet (0.4 mg total) under the tongue every 5 (five) minutes as needed. 25 tablet 11  . FISH OIL 1000 MG PO CAPS  1 po bid    . PANTOPRAZOLE SODIUM 40 MG PO TBEC Oral  Take 40 mg by mouth daily.      Marland Kitchen RIZATRIPTAN BENZOATE 10 MG PO TABS Oral Take 10 mg by mouth as needed. May repeat in 2 hours if needed     . ROSUVASTATIN CALCIUM 10 MG PO TABS Oral Take 10 mg by mouth daily.      Marland Kitchen TEMAZEPAM 15 MG PO CAPS Oral Take 15 mg by mouth at bedtime.      . TRAMADOL HCL 50 MG PO TABS Oral Take 50 mg by mouth every 6 (six) hours as needed.      . TRIAMCINOLONE ACETONIDE 55 MCG/ACT NA INHA Nasal 2 sprays by Nasal route daily.        BP 124/76  Pulse 71  Temp(Src) 97.6 F (36.4 C) (Oral)  Resp 20  Wt 220 lb (99.791 kg)  SpO2 98%  Physical Exam  Constitutional: She is oriented to person, place, and time. She appears well-developed and well-nourished. She appears distressed.  HENT:  Head: Normocephalic and atraumatic.  Right Ear: Tympanic membrane, external ear and ear canal normal.  Left Ear: Tympanic membrane, external ear and ear canal normal.  Mouth/Throat: Oropharynx is clear and moist. No oropharyngeal exudate.  Cardiovascular: Normal rate and normal heart sounds.   Pulmonary/Chest: Effort normal. No respiratory distress. She has no wheezes. She has no rales. She exhibits tenderness.  Lymphadenopathy:    She has cervical adenopathy.  Neurological: She is alert and oriented to person, place, and time.  Skin: Skin is warm and dry.    ED Course  Procedures (including critical care time)  Labs Reviewed - No data to display Dg Chest 2 View  08/25/2011  *RADIOLOGY REPORT*  Clinical Data: Cough.  CHEST - 2 VIEW  Comparison: 07/24/2011.  Findings:  Cardiopericardial silhouette within normal limits. Mediastinal contours normal. Trachea midline.  No airspace disease or effusion.  Stable configuration of the pulmonary hila compared to 07/24/2011 and remote comparisons dating back to 05/02/2010.  IMPRESSION:  1.  No active cardiopulmonary disease. 2.  Stable prominent hilar shadows dating back to 2008.  This probably represents normal variation of the hilar  vasculature.  Original Report Authenticated By: Andreas Newport, M.D.    CXR revealed no pneumonia. Will treat with some cough medicine and Mucinex. Informed patient to continue antibiotic given by PCP. Encouraged patient to return if symptoms worsen, fever persists, or experiences difficulty breathing. Follow up with PCP.      MDM  Viral upper respiratory illness based on her history of present illness physical exam and chest x-ray findings.  She will return here for any worsening in her condition.  Followup with her primary care Dr. for recheck.         Carlyle Dolly, PA-C 08/25/11 2349

## 2011-09-01 ENCOUNTER — Telehealth: Payer: Self-pay | Admitting: Gastroenterology

## 2011-09-01 ENCOUNTER — Ambulatory Visit (INDEPENDENT_AMBULATORY_CARE_PROVIDER_SITE_OTHER): Payer: Self-pay | Admitting: Gastroenterology

## 2011-09-01 ENCOUNTER — Encounter: Payer: Self-pay | Admitting: Gastroenterology

## 2011-09-01 VITALS — BP 112/60 | HR 64 | Ht 67.0 in | Wt 214.0 lb

## 2011-09-01 DIAGNOSIS — Z860100 Personal history of colon polyps, unspecified: Secondary | ICD-10-CM

## 2011-09-01 DIAGNOSIS — E669 Obesity, unspecified: Secondary | ICD-10-CM

## 2011-09-01 DIAGNOSIS — K219 Gastro-esophageal reflux disease without esophagitis: Secondary | ICD-10-CM

## 2011-09-01 DIAGNOSIS — Z1211 Encounter for screening for malignant neoplasm of colon: Secondary | ICD-10-CM

## 2011-09-01 DIAGNOSIS — D869 Sarcoidosis, unspecified: Secondary | ICD-10-CM

## 2011-09-01 DIAGNOSIS — J45909 Unspecified asthma, uncomplicated: Secondary | ICD-10-CM | POA: Insufficient documentation

## 2011-09-01 MED ORDER — PEG-KCL-NACL-NASULF-NA ASC-C 100 G PO SOLR: 1.0000 | ORAL | Status: DC

## 2011-09-01 NOTE — Telephone Encounter (Signed)
Pt informed that ASA does not need to be held prior to the procedure, she will call with any further questions or concerns

## 2011-09-01 NOTE — Progress Notes (Signed)
HPI: This is a   very pleasant 61 year old woman whom I am meeting for the first time today.   colonsocopy [facility] 2004 shoed diverticulosis, left side, polyps that were adenomatous on path.   Colonoscopy [facility] 2007 showed tics, no more polyps; recall was recommended by [facility] to have repeat at 10 year interval.  She has terrible coughing for about a year.  Uncontrollable coughing.  She has pyrosis, acid taste into mouth. Sleeps on pillows for years.  Currently on dexilant that seems to help better than previous PPI protonix.  She takes one pill once daily.    She does not drink caffeine or etoh, non-smoker.  Eats several small meals a day.  Her last meal is around 10 (1/2 sandwhich and juice or crackers). Lays down for bed shortly after meal, meds at night.  Gaining weight lately. "It's the heaviest I've been in many years"  She has asthma, sarcoidosis and has not seen her pulmonary doctor in several years.  She was recently in to see a surgeon to discuss fundoplication surgery as an option for her reflux.      Review of systems: Pertinent positive and negative review of systems were noted in the above HPI section. Complete review of systems was performed and was otherwise normal.    Past Medical History  Diagnosis Date  . Diabetes mellitus     type 2  . Hypertension   . Hyperlipidemia   . Paroxysmal a-fib     coumadin ( Chads2=2) Echo 09/05/10 EF 55-60%; mod LVH  . LVH (left ventricular hypertrophy)   . Asthma   . Arthritis   . Chronic headaches   . Colon polyps   . Depression   . Diverticulosis   . Fibromyalgia   . Gallstones   . IBS (irritable bowel syndrome)   . Kidney stones   . Peripheral neuropathy     Past Surgical History  Procedure Date  . Left ankle fracture     closed reduction March 2008  . Wrist arthroscopy     June 2003  . Carpal tunnel release 2003    bilateral  . Subcutaneous transposition of left ulnar nerve   . Abdominal hysterectomy   .  Cholecystectomy   . Colon surgery   . Elbow surgery     bilateral    Current Outpatient Prescriptions  Medication Sig Dispense Refill  . albuterol (PROVENTIL HFA;VENTOLIN HFA) 108 (90 BASE) MCG/ACT inhaler Inhale 2 puffs into the lungs every 6 (six) hours as needed.        . Ascorbic Acid (VITAMIN C) 500 MG tablet Take 500 mg by mouth daily.        . benzonatate (TESSALON) 100 MG capsule Take 100 mg by mouth 3 (three) times daily as needed.        . Calcium Carb-Cholecalciferol (OYSTER SHELL CALCIUM PLUS D) 500-125 MG-UNIT TABS 1 tab po qd       . Cranberry 405 MG CAPS Take by mouth. 1 po bid       . dexlansoprazole (DEXILANT) 60 MG capsule Take 60 mg by mouth daily.        Marland Kitchen diltiazem (CARDIZEM CD) 180 MG 24 hr capsule Take 180 mg by mouth daily.        Marland Kitchen dofetilide ([name]) 250 MCG capsule Take 250 mcg by mouth 2 (two) times daily.        . ferrous sulfate 325 (65 FE) MG EC tablet Take 325 mg by mouth daily with  breakfast.        . fluticasone (FLOVENT HFA) 110 MCG/ACT inhaler Inhale 1 puff into the lungs 2 (two) times daily.        . Guaifenesin 1200 MG TB12 Take 1 tablet (1,200 mg total) by mouth 2 (two) times daily.  20 each  0  . hydroxypropyl methylcellulose (ISOPTO TEARS) 2.5 % ophthalmic solution Place 1 drop into both eyes 3 (three) times daily as needed.        . insulin aspart (NOVOLOG) 100 UNIT/ML injection 6 units tid      . insulin glargine (LANTUS) 100 UNIT/ML injection Inject into the skin. 22 units daily      . levocetirizine (XYZAL) 5 MG tablet Take 5 mg by mouth as needed.        Marland Kitchen losartan (COZAAR) 100 MG tablet Take 100 mg by mouth daily.        . metFORMIN (GLUMETZA) 500 MG (MOD) 24 hr tablet Take 500 mg by mouth 2 (two) times daily with a meal.        . Naphazoline-Pheniramine (OPCON-A) 0.027-0.315 % SOLN Apply to eye as needed.        . niacin (NIASPAN) 500 MG CR tablet Take 500 mg by mouth at bedtime.        . nitroGLYCERIN (NITROSTAT) 0.4 MG SL tablet Place 1  tablet (0.4 mg total) under the tongue every 5 (five) minutes as needed.  25 tablet  11  . Omega-3 Fatty Acids (FISH OIL) 1000 MG CAPS 1 po bid      . pantoprazole (PROTONIX) 40 MG tablet Take 40 mg by mouth daily.        . promethazine-dextromethorphan (PROMETHAZINE-DM) 6.25-15 MG/5ML syrup Take 5 mLs by mouth every 4 (four) hours as needed for cough.  120 mL  0  . rizatriptan (MAXALT) 10 MG tablet Take 10 mg by mouth as needed. May repeat in 2 hours if needed       . rosuvastatin (CRESTOR) 10 MG tablet Take 10 mg by mouth daily.        . temazepam (RESTORIL) 15 MG capsule Take 15 mg by mouth at bedtime.        . traMADol (ULTRAM) 50 MG tablet Take 50 mg by mouth every 6 (six) hours as needed.        . triamcinolone (NASACORT) 55 MCG/ACT nasal inhaler 2 sprays by Nasal route daily.          Allergies as of 09/01/2011 - Review Complete 09/01/2011  Allergen Reaction Noted  . Amitriptyline  08/25/2011  . Hydromorphone hcl      Family History  Problem Relation Age of Onset  . Heart attack Mother     @ age 75  . Heart attack Brother     @ age 28  . Colon cancer Maternal Aunt   . Mental illness Mother   . Prostate cancer Maternal Grandfather   . Ovarian cancer Maternal Aunt     and cousin  . Diabetes Mother   . Diabetes      siblings  . Hypertension Mother     siblings  . Lupus Sister     History   Social History  . Marital Status: Divorced    Spouse Name: N/A    Number of Children: 3  . Years of Education: N/A   Occupational History  . disabled     CNA   Social History Main Topics  . Smoking status: Never Smoker   .  Smokeless tobacco: Never Used  . Alcohol Use: No  . Drug Use: No  . Sexually Active: Not on file   Other Topics Concern  . Not on file   Social History Narrative  . No narrative on file       Physical Exam: BP 112/60  Pulse 64  Ht 5\' 7"  (1.702 m)  Wt 214 lb (97.07 kg)  BMI 33.52 kg/m2 Constitutional: generally well-appearing Psychiatric:  alert and oriented x3 Eyes: extraocular movements intact Mouth: oral pharynx moist, no lesions Neck: supple no lymphadenopathy Cardiovascular: heart regular rate and rhythm Lungs: clear to auscultation bilaterally Abdomen: soft, nontender, nondistended, no obvious ascites, no peritoneal signs, normal bowel sounds Extremities: no lower extremity edema bilaterally Skin: no lesions on visible extremities    Assessment and plan: 61 y.o. female with  chronic cough, chronic GERD, history of adenomatous polyps  She has only a small hiatal hernia on recent area esophagram. I do not think fundoplication surgery should be considered at this point. She is not on maximal medical suppression for her acid, she also has some serious lifestyle modifications that could help treat her acid. Lastly, her chronic cough, may be partially related to GERD however she has known asthma and sarcoidosis and has not seen her pulmonologist in several years. I think there is a good chance that her chronic cough is more related to her pulmonary issues than her GERD.  She does have intermittent dysphagia. History of adenomatous polyps.  She will undergo EGD for her dysphasia, chronic GERD. She will also undergo colonoscopy for polyp surveillance since her last colonoscopy was over 5 years ago. I am adding an H2 blocker at night and have instructed  her not to be a meal within 2-3 hours of laying down for bed.  We will also help her get back in to see her pulmonary physicians for asthma, sarcoidosis, chronic cough.

## 2011-09-01 NOTE — Patient Instructions (Addendum)
You will be set up for a colonoscopy for polyp surveillance ([facility]) We will help you get back in to see Dr. [name] for your h/o sarcoid/athsma and sarcoid.  Your appointment is on 09/24/11 at 1145 am please arrive at 1130 am to fill out paper work.  [phone].  2nd floor of [facility]. You will be set up for an upper endoscopy for GERD, dysphagia. GERD handout. You should not be eating a meal within 3 hours of laying down for bed, that makes GERD worse. Start pepcid or zantac at bedtime (one pill, OTC).    Gastroesophageal reflux disease (GERD) happens when acid from your stomach flows up into the esophagus. When acid comes in contact with the esophagus, the acid causes soreness (inflammation) in the esophagus. Over time, GERD may create small holes (ulcers) in the lining of the esophagus. CAUSES   Increased body weight. This puts pressure on the stomach, making acid rise from the stomach into the esophagus.   Smoking. This increases acid production in the stomach.   Drinking alcohol. This causes decreased pressure in the lower esophageal sphincter (valve or ring of muscle between the esophagus and stomach), allowing acid from the stomach into the esophagus.   Late evening meals and a full stomach. This increases pressure and acid production in the stomach.   A malformed lower esophageal sphincter.  Sometimes, no cause is found. SYMPTOMS   Burning pain in the lower part of the mid-chest behind the breastbone and in the mid-stomach area. This may occur twice a week or more often.   Trouble swallowing.   Sore throat.   Dry cough.   Asthma-like symptoms including chest tightness, shortness of breath, or wheezing.  DIAGNOSIS  Your caregiver may be able to diagnose GERD based on your symptoms. In some cases, X-rays and other tests may be done to check for complications or to check the condition of your stomach and esophagus. TREATMENT  Your caregiver may recommend over-the-counter or  prescription medicines to help decrease acid production. Ask your caregiver before starting or adding any new medicines.  HOME CARE INSTRUCTIONS   Change the factors that you can control. Ask your caregiver for guidance concerning weight loss, quitting smoking, and alcohol consumption.   Avoid foods and drinks that make your symptoms worse, such as:   Caffeine or alcoholic drinks.   Chocolate.   Peppermint or mint flavorings.   Garlic and onions.   Spicy foods.   Citrus fruits, such as oranges, lemons, or limes.   Tomato-based foods such as sauce, chili, salsa, and pizza.   Fried and fatty foods.   Avoid lying down for the 3 hours prior to your bedtime or prior to taking a nap.   Eat small, frequent meals instead of large meals.   Wear loose-fitting clothing. Do not wear anything tight around your waist that causes pressure on your stomach.   Raise the head of your bed 6 to 8 inches with wood blocks to help you sleep. Extra pillows will not help.   Only take over-the-counter or prescription medicines for pain, discomfort, or fever as directed by your caregiver.   Do not take aspirin, ibuprofen, or other nonsteroidal anti-inflammatory drugs (NSAIDs).  SEEK IMMEDIATE MEDICAL CARE IF:   You have pain in your arms, neck, jaw, teeth, or back.   Your pain increases or changes in intensity or duration.   You develop nausea, vomiting, or sweating (diaphoresis).   You develop shortness of breath, or you faint.  Your vomit is green, yellow, black, or looks like coffee grounds or blood.   Your stool is red, bloody, or black.  These symptoms could be signs of other problems, such as heart disease, gastric bleeding, or esophageal bleeding. MAKE SURE YOU:   Understand these instructions.   Will watch your condition.   Will get help right away if you are not doing well or get worse.  Document Released: 05/13/2005 Document Revised: 04/15/2011 Document Reviewed:  02/20/2011 [facility] Patient Information 2012 [place], [place].

## 2011-09-02 ENCOUNTER — Telehealth: Payer: Self-pay | Admitting: Cardiovascular Disease

## 2011-09-02 NOTE — Telephone Encounter (Signed)
SEE NOTE ON TRIAGE CART  PT COMING IN TOM TO PICK UP SAMPLES OF MED AND SIGN  APPLICATION .[name]

## 2011-09-02 NOTE — Telephone Encounter (Signed)
New msg Pt wants to know about tikosyn medicine. She usually picks up here. please call

## 2011-09-03 NOTE — Telephone Encounter (Signed)
Samples given to the patient and her application for Pfizer was mailed. A copy of the application was put on [name]'s cart.

## 2011-09-04 ENCOUNTER — Telehealth: Payer: Self-pay | Admitting: Gastroenterology

## 2011-09-07 NOTE — Telephone Encounter (Signed)
Pt was called and a a free movi prep coupon was faxed to the pharmacy

## 2011-09-14 NOTE — Telephone Encounter (Signed)
PT AWARE LEFT 2 MORE WEEKS OF [name] 250 MCG AT FRONT DESK FOR PICK UP./CY

## 2011-09-18 NOTE — Telephone Encounter (Signed)
CALLED PLACED TO  CONNECTION CARE   APP DENIED FOR PT'S  [name]  PER CONNECTION CARE NEEDS NOTE FROM FRIEND OR FAMILY  MEMBER STATING HAS NO INCOME AND HAVE NOTARIZED PT AWARE OF ABOVE  AND WILL HAVE DAUGHTER  WRITE LETTER THIS WEEKEND AND TAKE TO BANK TO HAVE NOTARIZED  AND BRING BY OFFICE ON MON  TO REAPPLY .Zack Seal

## 2011-09-23 ENCOUNTER — Ambulatory Visit (AMBULATORY_SURGERY_CENTER): Payer: Self-pay | Admitting: Gastroenterology

## 2011-09-23 ENCOUNTER — Encounter: Payer: Self-pay | Admitting: Gastroenterology

## 2011-09-23 DIAGNOSIS — K2990 Gastroduodenitis, unspecified, without bleeding: Secondary | ICD-10-CM

## 2011-09-23 DIAGNOSIS — Z1211 Encounter for screening for malignant neoplasm of colon: Secondary | ICD-10-CM

## 2011-09-23 DIAGNOSIS — K219 Gastro-esophageal reflux disease without esophagitis: Secondary | ICD-10-CM

## 2011-09-23 DIAGNOSIS — K2970 Gastritis, unspecified, without bleeding: Secondary | ICD-10-CM

## 2011-09-23 DIAGNOSIS — K5730 Diverticulosis of large intestine without perforation or abscess without bleeding: Secondary | ICD-10-CM

## 2011-09-23 LAB — GLUCOSE, CAPILLARY
Glucose-Capillary: 137 mg/dL — ABNORMAL HIGH (ref 70–99)
Glucose-Capillary: 100 mg/dL — ABNORMAL HIGH (ref 70–99)

## 2011-09-23 MED ORDER — SODIUM CHLORIDE 0.9 % IV SOLN: 500.0000 mL | INTRAVENOUS | Status: DC

## 2011-09-23 NOTE — Progress Notes (Signed)
Patient did not experience any of the following events: a burn prior to discharge; a fall within the facility; wrong site/side/patient/procedure/implant event; or a hospital transfer or hospital admission upon discharge from the facility. (G8907) Patient did not have preoperative order for IV antibiotic SSI prophylaxis. (G8918)  

## 2011-09-23 NOTE — Patient Instructions (Signed)
Please refer to your blue and neon green sheets for instructions regarding diet and activity for the rest of today.  You may resume your medications as you would normally take them.  

## 2011-09-23 NOTE — Op Note (Signed)
[facility] [address] [place], [place]  [address]  COLONOSCOPY PROCEDURE REPORT  PATIENT:  [name], [name]  MR#:  [id number] BIRTHDATE:  09-22-1950, 60 yrs. old  GENDER:  female ENDOSCOPIST:  [name], MD PROCEDURE DATE:  09/23/2011 REFERRING:  Georganna Skeans, MD PROCEDURE:  Colonoscopy 19147 ASA CLASS:  Class II INDICATIONS:  colonsocopy [facility] 2004 showed diverticulosis, left side, polyps that were adenomatous on path. Colonoscopy [facility] 2007 showed tics, no more polyps; recall was recommended by [facility] to have repeat at 10 year interval. MEDICATIONS:   Fentanyl 125 mcg IV, These medications were titrated to patient response per physician's verbal order, Versed 12 mg IV  DESCRIPTION OF PROCEDURE:   After the risks benefits and alternatives of the procedure were thoroughly explained, informed consent was obtained.  Digital rectal exam was performed and revealed no rectal masses.   The LB [id number] endoscope was introduced through the anus and advanced to the cecum, which was identified by both the appendix and ileocecal valve, without limitations.  The quality of the prep was good..  The instrument was then slowly withdrawn as the colon was fully examined. <<PROCEDUREIMAGES>> FINDINGS:  Mild diverticulosis was found in the sigmoid to descending colon segments.  This was otherwise a normal examination of the colon (see image1, image2, and image3). Retroflexed views in the rectum revealed no abnormalities.  ENDOSCOPIC IMPRESSION: 1) Mild diverticulosis in the sigmoid to descending colon segments 2) Otherwise normal examination; no polyps or cancers  RECOMMENDATIONS: 1) Given your personal history of adenomatous (pre-cancerous) polyps, you will need a repeat colonoscopy in 5 years.  REPEAT EXAM:  5 years  ______________________________ [name], MD  n. eSIGNED:   [name] at 09/23/2011 02:40 PM  Edwena Bunde, [phone]

## 2011-09-23 NOTE — Op Note (Signed)
[facility] [address] [place], [place]  [address]  ENDOSCOPY PROCEDURE REPORT  PATIENT:  Toni, [name]  MR#:  [id number] BIRTHDATE:  Jan 08, 1951, 60 yrs. old  GENDER:  female ENDOSCOPIST:  [name], MD REFERRING:  Georganna Skeans, MD PROCEDURE DATE:  09/23/2011 PROCEDURE:  EGD with biopsy, 30865 ASA CLASS:  Class II INDICATIONS:  GERD symptoms and coughing (also has asthma and sarcoid) MEDICATIONS:  There was residual sedation effect present from prior procedure. TOPICAL ANESTHETIC:  none  DESCRIPTION OF PROCEDURE:   After the risks benefits and alternatives of the procedure were thoroughly explained, informed consent was obtained.  The LB [id number]-H180 [id number] endoscope was introduced through the mouth and advanced to the second portion of the duodenum, without limitations.  The instrument was slowly withdrawn as the mucosa was fully examined. <<PROCEDUREIMAGES>> Mild gastritis was found. There was moderate, non-specific gastritis. Biopsies taken and sent to pathology (jar 1) (see image6).  Otherwise the examination was normal (see image3, image4, image5, image2, and image1).    Retroflexed views revealed no abnormalities.    The scope was then withdrawn from the patient and the procedure completed.  COMPLICATIONS:  None  ENDOSCOPIC IMPRESSION: 1) Mild gastritis, biopsied to check for H. pylori 2) Otherwise normal examination  RECOMMENDATIONS: 1) Await biopsy results 2) You should continue dexilant once daily in AM and pepcid/zantac nightly at bedtime.  ______________________________ [name], MD  n. eSIGNED:   [name] at 09/23/2011 02:48 PM  Collier [place], [place], [phone]

## 2011-09-24 ENCOUNTER — Encounter: Payer: Self-pay | Admitting: Internal Medicine

## 2011-09-24 ENCOUNTER — Telehealth: Payer: Self-pay | Admitting: *Deleted

## 2011-09-24 ENCOUNTER — Ambulatory Visit (INDEPENDENT_AMBULATORY_CARE_PROVIDER_SITE_OTHER): Payer: Self-pay | Admitting: Internal Medicine

## 2011-09-24 DIAGNOSIS — D869 Sarcoidosis, unspecified: Secondary | ICD-10-CM

## 2011-09-24 DIAGNOSIS — J45909 Unspecified asthma, uncomplicated: Secondary | ICD-10-CM

## 2011-09-24 DIAGNOSIS — R05 Cough: Secondary | ICD-10-CM

## 2011-09-24 DIAGNOSIS — R059 Cough, unspecified: Secondary | ICD-10-CM

## 2011-09-24 MED ORDER — PREDNISONE (PAK) 10 MG PO TABS: ORAL_TABLET | ORAL | Status: AC

## 2011-09-24 NOTE — Assessment & Plan Note (Signed)
No convincing radiographic or clinical evidence of dz

## 2011-09-24 NOTE — Patient Instructions (Addendum)
Prednisone 10 mg take  4 each am x 2 days,   2 each am x 2 days,  1 each am x2days and stop  Stop fish oil  Continue dexilant before bfast and Pepcid ac 20 mg one bedtime  Stop flovent   GERD (REFLUX)  is an extremely common cause of respiratory symptoms, many times with no significant heartburn at all.    It can be treated with medication, but also with lifestyle changes including avoidance of late meals, excessive alcohol, smoking cessation, and avoid fatty foods, chocolate, peppermint, colas, red wine, and acidic juices such as orange juice.  NO MINT OR MENTHOL PRODUCTS SO NO COUGH DROPS  USE SUGARLESS CANDY INSTEAD (jolley ranchers or Stover's)  NO OIL BASED VITAMINS - use powdered substitutes.    See Tammy NP w/in 2 weeks with all your medications, even over the counter meds, separated in two separate bags, the ones you take no matter what vs the ones you stop once you feel better and take only as needed when you feel you need them.   Tammy  will generate for you a new user friendly medication calendar that will put [place] all on the same page re: your medication use.     Without this process, it simply isn't possible to assure that we are providing  your outpatient care  with  the attention to detail we feel you deserve.   If we cannot assure that you're getting that kind of care,  then we cannot manage your problem effectively from this clinic.  Once you have seen Tammy and we are sure that we're all on the same page with your medication use she will arrange follow up with me.

## 2011-09-24 NOTE — Progress Notes (Signed)
  Subjective:    Patient ID: [name], female    DOB: 09-26-1950, 61 y.o.   MRN: [id number]  HPI  50 yobf never smoker clinical dx with sarcoid here 1995 neg tbbx but classic cxr which gradually improved s   Requiring  chronic prednisone referred 09/24/2011 to pulmonary clinic by Dr Christella Hartigan for cough.    09/24/2011 f/u ov/[name] cc recurrent cough x 6 months productive of yellow to green x sev months early in am rx [facility] with zpak, amox, and a 3rd one  But no prednisone yet. Previous ent w/u with sinus ct neg but not done since 08/22/02 and not able to cough up any mucus on day of ov at all although the problem was persistent refractory daily by hx. Thinks flovent helps but is on an immense number of meds through [facility] not exactly sure how she takes them with bad HB even on dexilant. Doe x > slow adls assoc mostly with the cough  Sleeping ok without nocturnal  or early am exacerbation  of respiratory  c/o's or need for noct saba. Also denies any obvious fluctuation of symptoms with weather or environmental changes or other aggravating or alleviating factors except as outlined above        Review of Systems  Constitutional: Negative for fever, chills and unexpected weight change.  HENT: Positive for congestion, sore throat, trouble swallowing, postnasal drip and sinus pressure. Negative for ear pain, nosebleeds, rhinorrhea, sneezing, dental problem and voice change.   Eyes: Negative for visual disturbance.  Respiratory: Positive for cough and shortness of breath. Negative for choking.   Cardiovascular: Positive for chest pain. Negative for leg swelling.  Gastrointestinal: Positive for abdominal pain. Negative for vomiting and diarrhea.  Genitourinary: Negative for difficulty urinating.  Musculoskeletal: Positive for arthralgias.  Skin: Negative for rash.  Neurological: Negative for tremors, syncope and headaches.  Hematological: Bruises/bleeds easily.       Objective:   Physical Exam  Obese mod hoarse amb bf nad Wt 215 09/24/2011 vs 192 at last pulmonary f/u in 2004 HEENT: nl dentition, turbinates, and orophanx. Nl external ear canals without cough reflex   NECK :  without JVD/Nodes/TM/ nl carotid upstrokes bilaterally   LUNGS: no acc muscle use, clear to A and P bilaterally without cough on insp or exp maneuvers   CV:  RRR  no s3 or murmur or increase in P2, no edema   ABD:  soft and nontender with nl excursion in the supine position. No bruits or organomegaly, bowel sounds nl  MS:  warm without deformities, calf tenderness, cyanosis or clubbing  SKIN: warm and dry without lesions    NEURO:  alert, approp, no deficits   cxr 08/25/11 1. No active cardiopulmonary disease.  2. Stable prominent hilar shadows dating back to 2008. This  probably represents normal variation of the hilar vasculature.  My review: no classic sarcoid changes esp on lateral view which looks wnl      Assessment & Plan:

## 2011-09-24 NOTE — Telephone Encounter (Signed)
  Follow up Call-  Call back number 09/23/2011  Post procedure Call Back phone  # [phone]  Permission to leave phone message Yes     Patient questions:  Do you have a fever, pain , or abdominal swelling? no Pain Score  0 *  Have you tolerated food without any problems? yes  Have you been able to return to your normal activities? yes  Do you have any questions about your discharge instructions: Diet   no Medications  no Follow up visit  no  Do you have questions or concerns about your Care? no  Actions: * If pain score is 4 or above: No action needed, pain <4. Pt c/o throat being a little scratchy. Encouraged pt to keep throat moist & drink plenty of fluids & to call us back if not improving as day goes on, since pt was just getting up.

## 2011-09-24 NOTE — Assessment & Plan Note (Addendum)
The most common causes of chronic cough in immunocompetent adults include the following: upper airway cough syndrome (UACS), previously referred to as postnasal drip syndrome (PNDS), which is caused by variety of rhinosinus conditions; (2) asthma; (3) GERD; (4) chronic bronchitis from cigarette smoking or other inhaled environmental irritants; (5) nonasthmatic eosinophilic bronchitis; and (6) bronchiectasis.   These conditions, singly or in combination, have accounted for up to 94% of the causes of chronic cough in prospective studies.   Other conditions have constituted no >6% of the causes in prospective studies These have included bronchogenic carcinoma, chronic interstitial pneumonia, sarcoidosis, left ventricular failure, ACEI-induced cough, and aspiration from a condition associated with pharyngeal dysfunction.   Chronic cough is often simultaneously caused by more than one condition. A single cause has been found from 38 to 82% of the time, multiple causes from 18 to 62%. Multiply caused cough has been the result of three diseases up to 42% of the time.      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  Classic Upper airway cough syndrome, so named because it's frequently impossible to sort out how much is  CR/sinusitis with freq throat clearing (which can be related to primary GERD)   vs  causing  secondary (" extra esophageal")  GERD from wide swings in gastric pressure that occur with throat clearing, often  promoting self use of mint and menthol lozenges that reduce the lower esophageal sphincter tone and exacerbate the problem further in a cyclical fashion  These are the same pts who not infrequently have failed to tolerate ace inhibitors,  dry powder inhalers or biphosphonates or report having reflux symptoms that don't respond to standard doses of PPI , and are easily confused as having aecopd or asthma flares. For now needs to follow strict gerd diet, add h2 hs and a very short course of prednisone then return for  med reconciliation if she wants our help.

## 2011-09-24 NOTE — Assessment & Plan Note (Signed)
I am not convinced this is asthma but if it is would not use DPI which can cause refractory cough

## 2011-09-29 ENCOUNTER — Encounter: Payer: Self-pay | Admitting: Gastroenterology

## 2011-09-29 NOTE — Telephone Encounter (Signed)
CALLED MON TO  CONNECTION [facility]  HAVE NOT PROCESSED APP WAS SENT IN FOR SECOND TIME  WEEK AGO MON  [name] 250 MCG  BID  3 WEEKS WORTH OF SAMPLES LEFT AT FRONT DESK FOR PICK UP. PT AWARE/CY

## 2011-10-05 NOTE — Telephone Encounter (Signed)
PT AWARE [name] 250 MCG  RECEIVED FROM [facility]IST[facility]  PROGRAM   90 DAY SUPPLY LEFT AT FRONT DESK .Zack Seal

## 2011-10-09 ENCOUNTER — Ambulatory Visit (INDEPENDENT_AMBULATORY_CARE_PROVIDER_SITE_OTHER): Payer: Self-pay | Admitting: Adult Health

## 2011-10-09 ENCOUNTER — Encounter: Payer: Self-pay | Admitting: Adult Health

## 2011-10-09 ENCOUNTER — Ambulatory Visit: Payer: Self-pay | Admitting: Internal Medicine

## 2011-10-09 DIAGNOSIS — J45909 Unspecified asthma, uncomplicated: Secondary | ICD-10-CM

## 2011-10-09 MED ORDER — BENZONATATE 200 MG PO CAPS: 200.0000 mg | ORAL_CAPSULE | Freq: Three times a day (TID) | ORAL | Status: DC | PRN

## 2011-10-09 NOTE — Assessment & Plan Note (Signed)
Recent asthma flare vs psuedoasthma Patient's medications were reviewed today and patient education was given. Computerized medication calendar was adjusted/completed Cont on current regimen.  follow up Dr. [name]  In 3 weeks and As needed

## 2011-10-09 NOTE — Patient Instructions (Signed)
Follow med calendar closely and bring to each visit.  follow up Dr. [name]  In 3-4 weeks and As needed

## 2011-10-09 NOTE — Progress Notes (Signed)
Subjective:    Patient ID: [name], female    DOB: 1950-11-05, 61 y.o.   MRN: [id number]  HPI 61 yobf never smoker clinical dx with sarcoid here 1995 neg tbbx but classic cxr which gradually improved s   Requiring  chronic prednisone referred 60/02/2012 to pulmonary clinic by Dr Christella Hartigan for cough.    09/24/2011 f/u ov/Wert cc recurrent cough x 6 months productive of yellow to green x sev months early in am rx [facility] with zpak, amox, and a 3rd one  But no prednisone yet. Previous ent w/u with sinus ct neg but not done since 08/22/02 and not able to cough up any mucus on day of ov at all although the problem was persistent refractory daily by hx. Thinks flovent helps but is on an immense number of meds [facility] not exactly sure how she takes them with bad HB even on dexilant. Doe x > slow adls assoc mostly with the cough >>steroid taper , PPI and pepcid , fish oil stopped , flovent stopped   10/09/2011 Follow up and Med review Pt returns for follow up and med review. We reviewed all her medications and organized them into a med calendar with pt education. She is on numerous meds and appears she is taking correctly.  Since last ov is feeling better with decreased cough and dyspnea. Feels the steroid taper helped.  No fever or chest pain     ROS:  Constitutional:   No  weight loss, night sweats,  Fevers, chills,  +fatigue, or  lassitude.  HEENT:   No headaches,  Difficulty swallowing,  Tooth/dental problems, or  Sore throat,                No sneezing, itching, ear ache, nasal congestion, post nasal drip,   CV:  No chest pain,  Orthopnea, PND, swelling in lower extremities, anasarca, dizziness, palpitations, syncope.   GI  No heartburn, indigestion, abdominal pain, nausea, vomiting, diarrhea, change in bowel habits, loss of appetite, bloody stools.   Resp:   No coughing up of blood.  No change in color of mucus.  No wheezing.  No chest wall deformity  Skin: no rash or  lesions.  GU: no dysuria, change in color of urine, no urgency or frequency.  No flank pain, no hematuria   MS:  No joint pain or swelling.  No decreased range of motion.  No back pain.  Psych:  No change in mood or affect. No depression or anxiety.  No memory loss.          Objective:   Physical Exam  Obese mod hoarse amb bf nad Wt 215 09/24/2011 vs 192 at last pulmonary f/u in 2004>>215 10/09/2011  HEENT: nl dentition, turbinates, and orophanx. Nl external ear canals without cough reflex   NECK :  without JVD/Nodes/TM/ nl carotid upstrokes bilaterally   LUNGS: no acc muscle use, clear to A and P bilaterally without cough on insp or exp maneuvers   CV:  RRR  no s3 or murmur or increase in P2, no edema   ABD:  soft and nontender with nl excursion in the supine position. No bruits or organomegaly, bowel sounds nl  MS:  warm without deformities, calf tenderness, cyanosis or clubbing  SKIN: warm and dry without lesions    NEURO:  alert, approp, no deficits   cxr 08/25/11 1. No active cardiopulmonary disease.  2. Stable prominent hilar shadows dating back to 2008. This  probably  represents normal variation of the hilar vasculature.  Dr. [name]  review: no classic sarcoid changes esp on lateral view which looks wnl      Assessment & Plan:

## 2011-10-12 ENCOUNTER — Emergency Department (HOSPITAL_COMMUNITY)
Admission: EM | Admit: 2011-10-12 | Discharge: 2011-10-12 | Disposition: A | Discharge status: Home / Self Care | Payer: Self-pay | Source: Home / Self Care | Attending: Emergency Medicine | Admitting: Emergency Medicine

## 2011-10-12 ENCOUNTER — Encounter (HOSPITAL_COMMUNITY): Payer: Self-pay

## 2011-10-12 ENCOUNTER — Other Ambulatory Visit: Payer: Self-pay

## 2011-10-12 DIAGNOSIS — R739 Hyperglycemia, unspecified: Secondary | ICD-10-CM

## 2011-10-12 DIAGNOSIS — R7309 Other abnormal glucose: Secondary | ICD-10-CM

## 2011-10-12 LAB — POCT URINALYSIS DIP (DEVICE)
Glucose, UA: 500 mg/dL — AB
Nitrite: NEGATIVE
Urobilinogen, UA: 0.2 mg/dL (ref 0.0–1.0)

## 2011-10-12 LAB — BASIC METABOLIC PANEL
Calcium: 9.9 mg/dL (ref 8.4–10.5)
Chloride: 100 mEq/L (ref 96–112)
Creatinine, Ser: 1.1 mg/dL (ref 0.50–1.10)
GFR calc Af Amer: 62 mL/min — ABNORMAL LOW (ref >90)
Sodium: 137 mEq/L (ref 135–145)

## 2011-10-12 LAB — GLUCOSE, CAPILLARY: Glucose-Capillary: 331 mg/dL — ABNORMAL HIGH (ref 70–99)

## 2011-10-12 NOTE — ED Provider Notes (Signed)
History     CSN: [id number]  Arrival date & time 10/12/11  1234   First MD Initiated Contact with Patient 10/12/11 1309      Chief Complaint  Patient presents with  . Diabetes    (Consider location/radiation/quality/duration/timing/severity/associated sxs/prior treatment) HPI Comments: Was on 1 week of steriods for URI/asthma exacerbation, last dose was on the 13th.  Since then has been hyperglycemic, in 300-400's. Normally her glucose is in 100's-170's. Takes 6 units of novolog tid and 22 units of lantus at night. Has not changed her regimen while taking the steriods-does not have a sliding scale plan in place   patient complaining of feeling "not well". Patient with persistent cough, but no wheezing, chest pain, shortness breath. No Abdominal pain, polyuria, polydipsia, unintentional weight loss. No urinary complaints.Was told by [facility] to come here for evaluation.The only other change in medication is the Pepcid, dexilant which was added last month.   ROS as noted in history of present illness. The history is provided by the patient.    Past Medical History  Diagnosis Date  . Diabetes mellitus     type 2  . Hypertension   . Hyperlipidemia   . Paroxysmal a-fib     coumadin ( Chads2=2) Echo 09/05/10 EF 55-60%; mod LVH  . LVH (left ventricular hypertrophy)   . Asthma   . Arthritis   . Chronic headaches   . Colon polyps   . Depression   . Diverticulosis   . Fibromyalgia   . Gallstones   . IBS (irritable bowel syndrome)   . Kidney stones   . Peripheral neuropathy     Past Surgical History  Procedure Date  . Left ankle fracture     closed reduction March 2008  . Wrist arthroscopy     June 2003  . Carpal tunnel release 2003    bilateral  . Subcutaneous transposition of left ulnar nerve   . Abdominal hysterectomy   . Cholecystectomy   . Colon surgery   . Elbow surgery     bilateral    Family History  Problem Relation Age of Onset  . Heart attack Mother    @ age 19  . Heart attack Brother     @ age 53  . Colon cancer Maternal Aunt   . Mental illness Mother   . Prostate cancer Maternal Grandfather   . Ovarian cancer Maternal Aunt     and cousin  . Diabetes Mother   . Diabetes      siblings  . Hypertension Mother     siblings  . Lupus Sister     History  Substance Use Topics  . Smoking status: Never Smoker   . Smokeless tobacco: Never Used  . Alcohol Use: No    OB History    Grav Para Term Preterm Abortions TAB SAB Ect Mult Living                  Review of Systems  Allergies  Amitriptyline and Hydromorphone hcl  Home Medications   Current Outpatient Rx  Name Route Sig Dispense Refill  . ALBUTEROL SULFATE HFA 108 (90 BASE) MCG/ACT IN AERS Inhalation Inhale 2 puffs into the lungs every 6 (six) hours as needed.      Marland Kitchen VITAMIN C 500 MG PO TABS Oral Take 500 mg by mouth daily.      Marland Kitchen BENZONATATE 200 MG PO CAPS Oral Take 1 capsule (200 mg total) by mouth 3 (three) times daily as needed  for cough. 30 capsule 1  . CALCIUM CARB-CHOLECALCIFEROL 500-125 MG-UNIT PO TABS  1 tab po qd     . CRANBERRY 405 MG PO CAPS Oral Take by mouth. 1 po bid     . DEXLANSOPRAZOLE 60 MG PO CPDR Oral Take 60 mg by mouth daily.      Marland Kitchen DILTIAZEM HCL ER COATED BEADS 180 MG PO CP24 Oral Take 180 mg by mouth daily.      . DOFETILIDE 250 MCG PO CAPS Oral Take 250 mcg by mouth 2 (two) times daily.      Marland Kitchen FERROUS SULFATE 325 (65 FE) MG PO TBEC Oral Take 325 mg by mouth daily with breakfast.      . GUAIFENESIN ER 1200 MG PO TB12 Oral Take 1 tablet (1,200 mg total) by mouth 2 (two) times daily. 20 each 0  . HYPROMELLOSE 2.5 % OP SOLN Both Eyes Place 1 drop into both eyes 3 (three) times daily as needed.      . INSULIN ASPART 100 UNIT/ML [place] SOLN  6 units tid    . INSULIN GLARGINE 100 UNIT/ML [place] SOLN Subcutaneous Inject into the skin. 22 units daily    . LEVOCETIRIZINE DIHYDROCHLORIDE 5 MG PO TABS Oral Take 5 mg by mouth as needed.      Marland Kitchen LOSARTAN POTASSIUM  100 MG PO TABS Oral Take 100 mg by mouth daily.      Marland Kitchen METFORMIN HCL ER (MOD) 500 MG PO TB24 Oral Take 500 mg by mouth 2 (two) times daily with a meal.      . NAPHAZOLINE-PHENIRAMINE 0.027-0.315 % OP SOLN Ophthalmic Apply to eye as needed.      Marland Kitchen NIACIN ER (ANTIHYPERLIPIDEMIC) 500 MG PO TBCR Oral Take 500 mg by mouth at bedtime.      Marland Kitchen NITROGLYCERIN 0.4 MG SL SUBL Sublingual Place 1 tablet (0.4 mg total) under the tongue every 5 (five) minutes as needed. 25 tablet 11  . RIZATRIPTAN BENZOATE 10 MG PO TABS Oral Take 10 mg by mouth as needed. May repeat in 2 hours if needed     . ROSUVASTATIN CALCIUM 10 MG PO TABS Oral Take 10 mg by mouth daily.      Marland Kitchen TEMAZEPAM 15 MG PO CAPS Oral Take 15 mg by mouth at bedtime.      . TRAMADOL HCL 50 MG PO TABS Oral Take 50 mg by mouth every 6 (six) hours as needed.      . TRIAMCINOLONE ACETONIDE 55 MCG/ACT NA INHA Nasal 2 sprays by Nasal route daily.        BP 126/54  Pulse 80  Temp(Src) 98.1 F (36.7 C) (Oral)  Resp 20  SpO2 98%  Physical Exam  Nursing note and vitals reviewed. Constitutional: She is oriented to person, place, and time. She appears well-developed and well-nourished.  HENT:  Head: Normocephalic and atraumatic.  Eyes: Conjunctivae and EOM are normal.  Neck: Normal range of motion.  Cardiovascular: Normal rate, regular rhythm, normal heart sounds and intact distal pulses.   No murmur heard. Pulmonary/Chest: Effort normal and breath sounds normal.  Abdominal: Soft. Bowel sounds are normal. She exhibits no distension. There is no tenderness. There is no rebound and no guarding.  Musculoskeletal: Normal range of motion. She exhibits no tenderness.  Neurological: She is alert and oriented to person, place, and time.  Skin: Skin is warm and dry.  Psychiatric: She has a normal mood and affect. Her behavior is normal. Judgment and thought content normal.  ED Course  Procedures (including critical care time)  Labs Reviewed  GLUCOSE,  CAPILLARY - Abnormal; Notable for the following:    Glucose-Capillary 331 (*)    All other components within normal limits  BASIC METABOLIC PANEL - Abnormal; Notable for the following:    Glucose, Bld 341 (*)    GFR calc non Af Amer 54 (*)    GFR calc Af Amer 62 (*)    All other components within normal limits  POCT URINALYSIS DIP (DEVICE) - Abnormal; Notable for the following:    Glucose, UA 500 (*)    All other components within normal limits   No results found.  1. Hyperglycemia       MDM  EKG: Normal sinus rhythm, rate 75. Normal axis, normal intervals. Has nonspecific T-wave changes. No acute ST or T wave changes from previous EKG from June 2012.  I saw and examined the patient. Patient with no evidence of ACS,  UTI, pneumonia, or other serious cause of her persistent hyperglycemia. Suspect that this is from the recent steroids, as she did not have a sliding scale plan in place. Will increase her Lantus to 30 units tonight, and have her monitor her sugars twice daily. Will have her gradually decrease her Lantus to her normal dose of 22 units as her sugars become under better control. Discussed labs, mdm, and plan with patient, who agrees. Advised patient on signs of hypoglycemia.Luiz Blare, MD 10/12/11 [phone]

## 2011-10-12 NOTE — ED Provider Notes (Signed)
See my note for full details and labs  Medical screening examination/treatment/procedure(s) were performed by non-physician practitioner and as supervising physician I was immediately available for consultation/collaboration.  Luiz Blare MD   Luiz Blare, MD 10/12/11 Rickey Primus

## 2011-10-12 NOTE — Discharge Instructions (Signed)
Continue checking your sugar twice a day. Increase your Lantus to 30 units tonight.  gradually decrease the amount of the Lantus as your sugars get under better control. Example: 30 units of Lantus tonight, 28 units tomorrow and the next day, 25 units over the next 2 days, and so forth. This will change based on how your glucose is doing. Watch for signs of hypoglycemia, such as nausea, sweatiness, feeling like you're about to passout, palpitations. He treated you sure it immediately if you have any the symptoms. You will need to followup with your doctor, and come up with a regimen for you to follow when you're taking medications that elevated your blood sugar, like steroids. Return for any concerns.  Hyperglycemia Hyperglycemia occurs when the glucose (sugar) in your blood is too high. Hyperglycemia can happen for many reasons, but it most often happens to people who do not know they have diabetes or are not managing their diabetes properly.  CAUSES  Whether you have diabetes or not, there are other causes of hyperglycemia. Hyperglycemia can occur when you have diabetes, but it can also occur in other situations that you might not be as aware of, such as: Diabetes  If you have diabetes and are having problems controlling your blood glucose, hyperglycemia could occur because of some of the following reasons:   Not following your meal plan.   Not taking your diabetes medications or not taking it properly.   Exercising less or doing less activity than you normally do.   Being sick.  Pre-diabetes  This cannot be ignored. Before people develop Type 2 diabetes, they almost always have "pre-diabetes." This is when your blood glucose levels are higher than normal, but not yet high enough to be diagnosed as diabetes. Research has shown that some long-term damage to the body, especially the heart and circulatory system, may already be occurring during pre-diabetes. If you take action to manage your blood  glucose when you have pre-diabetes, you may delay or prevent Type 2 diabetes from developing.  Stress  If you have diabetes, you may be "diet" controlled or on oral medications or insulin to control your diabetes. However, you may find that your blood glucose is higher than usual in the hospital whether you have diabetes or not. This is often referred to as "stress hyperglycemia." Stress can elevate your blood glucose. This happens because of hormones put out by the body during times of stress. If stress has been the cause of your high blood glucose, it can be followed regularly by your caregiver. That way he/she can make sure your hyperglycemia does not continue to get worse or progress to diabetes.  Steroids  Steroids are medications that act on the infection fighting system (immune system) to block inflammation or infection. One side effect can be a rise in blood glucose. Most people can produce enough extra insulin to allow for this rise, but for those who cannot, steroids make blood glucose levels go even higher. It is not unusual for steroid treatments to "uncover" diabetes that is developing. It is not always possible to determine if the hyperglycemia will go away after the steroids are stopped. A special blood test called an A1c is sometimes done to determine if your blood glucose was elevated before the steroids were started.  SYMPTOMS  Thirsty.   Frequent urination.   Dry mouth.   Blurred vision.   Tired or fatigue.   Weakness.   Sleepy.   Tingling in feet or leg.  DIAGNOSIS  Diagnosis is made by monitoring blood glucose in one or all of the following ways:  A1c test. This is a chemical found in your blood.   Fingerstick blood glucose monitoring.   Laboratory results.  TREATMENT  First, knowing the cause of the hyperglycemia is important before the hyperglycemia can be treated. Treatment may include, but is not be limited to:  Education.   Change or adjustment in  medications.   Change or adjustment in meal plan.   Treatment for an illness, infection, etc.   More frequent blood glucose monitoring.   Change in exercise plan.   Decreasing or stopping steroids.   Lifestyle changes.  HOME CARE INSTRUCTIONS   Test your blood glucose as directed.   Exercise regularly. Your caregiver will give you instructions about exercise. Pre-diabetes or diabetes which comes on with stress is helped by exercising.   Eat wholesome, balanced meals. Eat often and at regular, fixed times. Your caregiver or nutritionist will give you a meal plan to guide your sugar intake.   Being at an ideal weight is important. If needed, losing as little as 10 to 15 pounds may help improve blood glucose levels.  SEEK MEDICAL CARE IF:   You have questions about medicine, activity, or diet.   You continue to have symptoms (problems such as increased thirst, urination, or weight gain).  SEEK IMMEDIATE MEDICAL CARE IF:   You are vomiting or have diarrhea.   Your breath smells fruity.   You are breathing faster or slower.   You are very sleepy or incoherent.   You have numbness, tingling, or pain in your feet or hands.   You have chest pain.   Your symptoms get worse even though you have been following your caregiver's orders.   If you have any other questions or concerns.  Document Released: 01/27/2001 Document Revised: 04/15/2011 Document Reviewed: 03/25/2009 [facility] Patient Information 2012 [place], [place].

## 2011-10-12 NOTE — ED Notes (Signed)
Reports her glucose reading has been elevated and not under good control over the weekend; was reportedly instructed to come to the [facility] for evaluation

## 2011-10-12 NOTE — ED Provider Notes (Signed)
History     CSN: [id number]  Arrival date & time 10/12/11  1234   None     Chief Complaint  Patient presents with  . Diabetes    (Consider location/radiation/quality/duration/timing/severity/associated sxs/prior treatment) Patient is a 61 y.o. female presenting with diabetes problem. The history is provided by the patient. No language interpreter was used.  Diabetes She presents for her initial diabetic visit. No MedicAlert identification noted. Her disease course has been worsening. There are no hypoglycemic associated symptoms. Associated symptoms include weakness. Symptoms are worsening. Risk factors for coronary artery disease include diabetes mellitus and hypertension. Current diabetic treatment includes insulin injections and oral agent (monotherapy). She is compliant with treatment all of the time. She is following a diabetic diet. Her home blood glucose trend is increasing rapidly.   Pt reports her doctor treated her with prednisone last week.  Pt reports her glucose is still elevated. Pt reports her doctor told her her glucose would go up.  Pt is concerned because  Daily levels are not decreasing. Past Medical History  Diagnosis Date  . Diabetes mellitus     type 2  . Hypertension   . Hyperlipidemia   . Paroxysmal a-fib     coumadin ( Chads2=2) Echo 09/05/10 EF 55-60%; mod LVH  . LVH (left ventricular hypertrophy)   . Asthma   . Arthritis   . Chronic headaches   . Colon polyps   . Depression   . Diverticulosis   . Fibromyalgia   . Gallstones   . IBS (irritable bowel syndrome)   . Kidney stones   . Peripheral neuropathy     Past Surgical History  Procedure Date  . Left ankle fracture     closed reduction March 2008  . Wrist arthroscopy     June 2003  . Carpal tunnel release 2003    bilateral  . Subcutaneous transposition of left ulnar nerve   . Abdominal hysterectomy   . Cholecystectomy   . Colon surgery   . Elbow surgery     bilateral    Family  History  Problem Relation Age of Onset  . Heart attack Mother     @ age 60  . Heart attack Brother     @ age 1  . Colon cancer Maternal Aunt   . Mental illness Mother   . Prostate cancer Maternal Grandfather   . Ovarian cancer Maternal Aunt     and cousin  . Diabetes Mother   . Diabetes      siblings  . Hypertension Mother     siblings  . Lupus Sister     History  Substance Use Topics  . Smoking status: Never Smoker   . Smokeless tobacco: Never Used  . Alcohol Use: No    OB History    Grav Para Term Preterm Abortions TAB SAB Ect Mult Living                  Review of Systems  Neurological: Positive for weakness.  All other systems reviewed and are negative.    Allergies  Amitriptyline and Hydromorphone hcl  Home Medications   Current Outpatient Rx  Name Route Sig Dispense Refill  . ALBUTEROL SULFATE HFA 108 (90 BASE) MCG/ACT IN AERS Inhalation Inhale 2 puffs into the lungs every 6 (six) hours as needed.      Marland Kitchen VITAMIN C 500 MG PO TABS Oral Take 500 mg by mouth daily.      Marland Kitchen BENZONATATE 200 MG PO  CAPS Oral Take 1 capsule (200 mg total) by mouth 3 (three) times daily as needed for cough. 30 capsule 1  . CALCIUM CARB-CHOLECALCIFEROL 500-125 MG-UNIT PO TABS  1 tab po qd     . CRANBERRY 405 MG PO CAPS Oral Take by mouth. 1 po bid     . DEXLANSOPRAZOLE 60 MG PO CPDR Oral Take 60 mg by mouth daily.      Marland Kitchen DILTIAZEM HCL ER COATED BEADS 180 MG PO CP24 Oral Take 180 mg by mouth daily.      . DOFETILIDE 250 MCG PO CAPS Oral Take 250 mcg by mouth 2 (two) times daily.      Marland Kitchen FERROUS SULFATE 325 (65 FE) MG PO TBEC Oral Take 325 mg by mouth daily with breakfast.      . GUAIFENESIN ER 1200 MG PO TB12 Oral Take 1 tablet (1,200 mg total) by mouth 2 (two) times daily. 20 each 0  . HYPROMELLOSE 2.5 % OP SOLN Both Eyes Place 1 drop into both eyes 3 (three) times daily as needed.      . INSULIN ASPART 100 UNIT/ML [place] SOLN  6 units tid    . INSULIN GLARGINE 100 UNIT/ML [place] SOLN  Subcutaneous Inject into the skin. 22 units daily    . LEVOCETIRIZINE DIHYDROCHLORIDE 5 MG PO TABS Oral Take 5 mg by mouth as needed.      Marland Kitchen LOSARTAN POTASSIUM 100 MG PO TABS Oral Take 100 mg by mouth daily.      Marland Kitchen METFORMIN HCL ER (MOD) 500 MG PO TB24 Oral Take 500 mg by mouth 2 (two) times daily with a meal.      . NAPHAZOLINE-PHENIRAMINE 0.027-0.315 % OP SOLN Ophthalmic Apply to eye as needed.      Marland Kitchen NIACIN ER (ANTIHYPERLIPIDEMIC) 500 MG PO TBCR Oral Take 500 mg by mouth at bedtime.      Marland Kitchen NITROGLYCERIN 0.4 MG SL SUBL Sublingual Place 1 tablet (0.4 mg total) under the tongue every 5 (five) minutes as needed. 25 tablet 11  . RIZATRIPTAN BENZOATE 10 MG PO TABS Oral Take 10 mg by mouth as needed. May repeat in 2 hours if needed     . ROSUVASTATIN CALCIUM 10 MG PO TABS Oral Take 10 mg by mouth daily.      Marland Kitchen TEMAZEPAM 15 MG PO CAPS Oral Take 15 mg by mouth at bedtime.      . TRAMADOL HCL 50 MG PO TABS Oral Take 50 mg by mouth every 6 (six) hours as needed.      . TRIAMCINOLONE ACETONIDE 55 MCG/ACT NA INHA Nasal 2 sprays by Nasal route daily.        BP 126/54  Pulse 80  Temp(Src) 98.1 F (36.7 C) (Oral)  Resp 20  SpO2 98%  Physical Exam  Vitals reviewed. Constitutional: She is oriented to person, place, and time. She appears well-developed and well-nourished.  HENT:  Head: Normocephalic and atraumatic.  Right Ear: External ear normal.  Left Ear: External ear normal.  Nose: Nose normal.  Mouth/Throat: Oropharynx is clear and moist.  Eyes: Conjunctivae and EOM are normal. Pupils are equal, round, and reactive to light.  Neck: Normal range of motion. Neck supple.  Cardiovascular: Normal rate and normal heart sounds.   Pulmonary/Chest: Effort normal.  Abdominal: Soft. Bowel sounds are normal.  Musculoskeletal: Normal range of motion.  Neurological: She is alert and oriented to person, place, and time. She has normal reflexes.  Skin: Skin is warm.  Psychiatric:  She has a normal mood and  affect.    ED Course  Procedures (including critical care time)  Labs Reviewed  GLUCOSE, CAPILLARY - Abnormal; Notable for the following:    Glucose-Capillary 331 (*)    All other components within normal limits   No results found.   No diagnosis found.    MDM  Glucose331.  I will check bmet       Langston Masker, [place] 10/12/11 1317  Langston Masker, [place] 10/12/11 1335

## 2011-10-14 ENCOUNTER — Telehealth: Payer: Self-pay | Admitting: Internal Medicine

## 2011-10-14 NOTE — Telephone Encounter (Signed)
LMTCB x 1 

## 2011-10-15 NOTE — Telephone Encounter (Signed)
Spoke with pt. She states that her cough is no better since last visit with TP on 10/07/11, no worse though. She states that she also wants to let us know that she ended up in ED on 2/25 because her sugars were too high. Her PCP is adjusting insulin. She wants to know if there is anything else she can take for the cough. I d/w TP and she states forward to MW for recs. Please advise thanks

## 2011-10-15 NOTE — Telephone Encounter (Signed)
[id number] x1 

## 2011-10-15 NOTE — Telephone Encounter (Signed)
Ov next avail with all meds in hand

## 2011-10-15 NOTE — Telephone Encounter (Signed)
Pt returned triage's call.  [name] ° °

## 2011-10-16 ENCOUNTER — Emergency Department (HOSPITAL_COMMUNITY)
Admission: EM | Admit: 2011-10-16 | Discharge: 2011-10-16 | Disposition: A | Discharge status: Home Health | Payer: Self-pay | Attending: Emergency Medicine | Admitting: Emergency Medicine

## 2011-10-16 ENCOUNTER — Encounter (HOSPITAL_COMMUNITY): Payer: Self-pay

## 2011-10-16 DIAGNOSIS — E785 Hyperlipidemia, unspecified: Secondary | ICD-10-CM | POA: Insufficient documentation

## 2011-10-16 DIAGNOSIS — R739 Hyperglycemia, unspecified: Secondary | ICD-10-CM

## 2011-10-16 DIAGNOSIS — R5381 Other malaise: Secondary | ICD-10-CM | POA: Insufficient documentation

## 2011-10-16 DIAGNOSIS — I10 Essential (primary) hypertension: Secondary | ICD-10-CM | POA: Insufficient documentation

## 2011-10-16 DIAGNOSIS — R531 Weakness: Secondary | ICD-10-CM

## 2011-10-16 DIAGNOSIS — Z794 Long term (current) use of insulin: Secondary | ICD-10-CM | POA: Insufficient documentation

## 2011-10-16 DIAGNOSIS — E119 Type 2 diabetes mellitus without complications: Secondary | ICD-10-CM | POA: Insufficient documentation

## 2011-10-16 DIAGNOSIS — Z79899 Other long term (current) drug therapy: Secondary | ICD-10-CM | POA: Insufficient documentation

## 2011-10-16 LAB — CBC
Platelets: 156 10*3/uL (ref 150–400)
RDW: 14.3 % (ref 11.5–15.5)
WBC: 4 10*3/uL (ref 4.0–10.5)

## 2011-10-16 LAB — URINALYSIS, ROUTINE W REFLEX MICROSCOPIC
Glucose, UA: NEGATIVE mg/dL
Protein, ur: NEGATIVE mg/dL
pH: 5 (ref 5.0–8.0)

## 2011-10-16 LAB — BASIC METABOLIC PANEL
CO2: 21 mEq/L (ref 19–32)
Calcium: 9.3 mg/dL (ref 8.4–10.5)
Chloride: 105 mEq/L (ref 96–112)
Sodium: 140 mEq/L (ref 135–145)

## 2011-10-16 LAB — URINE MICROSCOPIC-ADD ON

## 2011-10-16 LAB — DIFFERENTIAL
Basophils Absolute: 0 10*3/uL (ref 0.0–0.1)
Lymphocytes Relative: 48 % — ABNORMAL HIGH (ref 12–46)
Neutro Abs: 1.6 10*3/uL — ABNORMAL LOW (ref 1.7–7.7)
Neutrophils Relative %: 40 % — ABNORMAL LOW (ref 43–77)

## 2011-10-16 LAB — GLUCOSE, CAPILLARY: Glucose-Capillary: 157 mg/dL — ABNORMAL HIGH (ref 70–99)

## 2011-10-16 MED ORDER — SODIUM CHLORIDE 0.9 % IV BOLUS (SEPSIS)
500.0000 mL | Freq: Once | INTRAVENOUS | Status: AC
  Administered 2011-10-16: 500 mL via INTRAVENOUS

## 2011-10-16 NOTE — ED Provider Notes (Signed)
History     CSN: [id number]  Arrival date & time 10/16/11  1339   First MD Initiated Contact with Patient 10/16/11 1341      Chief Complaint  Patient presents with  . Hyperglycemia    (Consider location/radiation/quality/duration/timing/severity/associated sxs/prior treatment) Patient is a 61 y.o. female presenting with weakness. The history is provided by the patient.  Weakness Primary symptoms do not include headaches, syncope, dizziness, fever, nausea or vomiting. The symptoms began more than 1 week ago.  Additional symptoms include weakness.  Pt states she was placed on prednisone a little over a week ago. States since then has had problems controlling her blood sugar. States she was seen a week ago, had her insulin dose increased and placed on sliding scale, states still blood sugar as high as upper 200s at home. States her usual baseline is in 100s. Pt also reports feeling weak for same amount of time, tired, tingling in both hands and legs. Denies fever, chills, chest pain, sob, abdominal pain, nausea, vomiting, urinary symptoms. States also having high blood pressures at home even though taking her medications. States bp as high as 160 systolic.   Past Medical History  Diagnosis Date  . Diabetes mellitus     type 2  . Hypertension   . Hyperlipidemia   . Paroxysmal a-fib     coumadin ( Chads2=2) Echo 09/05/10 EF 55-60%; mod LVH  . LVH (left ventricular hypertrophy)   . Asthma   . Arthritis   . Chronic headaches   . Colon polyps   . Depression   . Diverticulosis   . Fibromyalgia   . Gallstones   . IBS (irritable bowel syndrome)   . Kidney stones   . Peripheral neuropathy     Past Surgical History  Procedure Date  . Left ankle fracture     closed reduction March 2008  . Wrist arthroscopy     June 2003  . Carpal tunnel release 2003    bilateral  . Subcutaneous transposition of left ulnar nerve   . Abdominal hysterectomy   . Cholecystectomy   . Colon surgery     . Elbow surgery     bilateral    Family History  Problem Relation Age of Onset  . Heart attack Mother     @ age 35  . Heart attack Brother     @ age 9  . Colon cancer Maternal Aunt   . Mental illness Mother   . Prostate cancer Maternal Grandfather   . Ovarian cancer Maternal Aunt     and cousin  . Diabetes Mother   . Diabetes      siblings  . Hypertension Mother     siblings  . Lupus Sister     History  Substance Use Topics  . Smoking status: Never Smoker   . Smokeless tobacco: Never Used  . Alcohol Use: No    OB History    Grav Para Term Preterm Abortions TAB SAB Ect Mult Living                  Review of Systems  Constitutional: Positive for fatigue. Negative for fever and chills.  HENT: Negative.   Eyes: Negative.   Respiratory: Negative.   Cardiovascular: Negative.  Negative for syncope.  Gastrointestinal: Negative.  Negative for nausea and vomiting.  Genitourinary: Negative.   Skin: Negative.   Neurological: Positive for weakness. Negative for dizziness and headaches.  Psychiatric/Behavioral: Negative.     Allergies  Amitriptyline and  Hydromorphone hcl  Home Medications   Current Outpatient Rx  Name Route Sig Dispense Refill  . ALBUTEROL SULFATE HFA 108 (90 BASE) MCG/ACT IN AERS Inhalation Inhale 2 puffs into the lungs every 6 (six) hours as needed.      Marland Kitchen BENZONATATE 200 MG PO CAPS Oral Take 1 capsule (200 mg total) by mouth 3 (three) times daily as needed for cough. 30 capsule 1  . DEXLANSOPRAZOLE 60 MG PO CPDR Oral Take 60 mg by mouth daily.      Marland Kitchen DILTIAZEM HCL ER COATED BEADS 180 MG PO CP24 Oral Take 180 mg by mouth daily.      . DOFETILIDE 250 MCG PO CAPS Oral Take 250 mcg by mouth 2 (two) times daily.      Marland Kitchen FERROUS SULFATE 325 (65 FE) MG PO TBEC Oral Take 325 mg by mouth daily with breakfast.      . GUAIFENESIN ER 1200 MG PO TB12 Oral Take 1 tablet (1,200 mg total) by mouth 2 (two) times daily. 20 each 0  . HYPROMELLOSE 2.5 % OP SOLN Both  Eyes Place 1 drop into both eyes 3 (three) times daily as needed.      . INSULIN ASPART 100 UNIT/ML [place] SOLN  6 units tid    . INSULIN GLARGINE 100 UNIT/ML [place] SOLN Subcutaneous Inject into the skin. 22 units daily    . LEVOCETIRIZINE DIHYDROCHLORIDE 5 MG PO TABS Oral Take 5 mg by mouth as needed.      Marland Kitchen NAPHAZOLINE-PHENIRAMINE 0.027-0.315 % OP SOLN Ophthalmic Apply to eye as needed.      Marland Kitchen NIACIN ER (ANTIHYPERLIPIDEMIC) 500 MG PO TBCR Oral Take 500 mg by mouth at bedtime.      Marland Kitchen NITROGLYCERIN 0.4 MG SL SUBL Sublingual Place 1 tablet (0.4 mg total) under the tongue every 5 (five) minutes as needed. 25 tablet 11  . RIZATRIPTAN BENZOATE 10 MG PO TABS Oral Take 10 mg by mouth as needed. May repeat in 2 hours if needed     . ROSUVASTATIN CALCIUM 10 MG PO TABS Oral Take 10 mg by mouth daily.      Marland Kitchen TEMAZEPAM 15 MG PO CAPS Oral Take 15 mg by mouth at bedtime.      . TRAMADOL HCL 50 MG PO TABS Oral Take 50 mg by mouth every 6 (six) hours as needed.      . TRIAMCINOLONE ACETONIDE 55 MCG/ACT NA INHA Nasal 2 sprays by Nasal route daily.        BP 144/83  Pulse 73  Temp(Src) 97.5 F (36.4 C) (Oral)  Resp 16  SpO2 97%  Physical Exam  Nursing note and vitals reviewed. Constitutional: She is oriented to person, place, and time. She appears well-developed and well-nourished. No distress.  HENT:  Head: Normocephalic and atraumatic.  Eyes: Conjunctivae are normal.  Neck: Neck supple.  Cardiovascular: Normal rate, regular rhythm and normal heart sounds.   Pulmonary/Chest: Effort normal and breath sounds normal. No respiratory distress.  Abdominal: Soft. Bowel sounds are normal. She exhibits no distension. There is no tenderness.  Musculoskeletal: Normal range of motion. She exhibits no edema.  Neurological: She is alert and oriented to person, place, and time.  Skin: Skin is warm.  Psychiatric: She has a normal mood and affect.    ED Course  Procedures (including critical care time)  Labs  Reviewed  GLUCOSE, CAPILLARY - Abnormal; Notable for the following:    Glucose-Capillary 157 (*)    All other components within  normal limits  CBC  DIFFERENTIAL  BASIC METABOLIC PANEL  URINALYSIS, ROUTINE W REFLEX MICROSCOPIC   Results for orders placed during the hospital encounter of 10/16/11  GLUCOSE, CAPILLARY      Component Value Range   Glucose-Capillary 157 (*) 70 - 99 (mg/dL)  CBC      Component Value Range   WBC 4.0  4.0 - 10.5 (K/uL)   RBC 3.99  3.87 - 5.11 (MIL/uL)   Hemoglobin 11.8 (*) 12.0 - 15.0 (g/dL)   HCT 16.1 (*) 09.6 - 46.0 (%)   MCV 87.0  78.0 - 100.0 (fL)   MCH 29.6  26.0 - 34.0 (pg)   MCHC 34.0  30.0 - 36.0 (g/dL)   RDW 04.5  40.9 - 81.1 (%)   Platelets 156  150 - 400 (K/uL)  DIFFERENTIAL      Component Value Range   Neutrophils Relative 40 (*) 43 - 77 (%)   Neutro Abs 1.6 (*) 1.7 - 7.7 (K/uL)   Lymphocytes Relative 48 (*) 12 - 46 (%)   Lymphs Abs 1.9  0.7 - 4.0 (K/uL)   Monocytes Relative 10  3 - 12 (%)   Monocytes Absolute 0.4  0.1 - 1.0 (K/uL)   Eosinophils Relative 3  0 - 5 (%)   Eosinophils Absolute 0.1  0.0 - 0.7 (K/uL)   Basophils Relative 0  0 - 1 (%)   Basophils Absolute 0.0  0.0 - 0.1 (K/uL)  BASIC METABOLIC PANEL      Component Value Range   Sodium 140  135 - 145 (mEq/L)   Potassium 3.5  3.5 - 5.1 (mEq/L)   Chloride 105  96 - 112 (mEq/L)   CO2 21  19 - 32 (mEq/L)   Glucose, Bld 112 (*) 70 - 99 (mg/dL)   BUN 12  6 - 23 (mg/dL)   Creatinine, Ser 9.14  0.50 - 1.10 (mg/dL)   Calcium 9.3  8.4 - 78.2 (mg/dL)   GFR calc non Af Amer 59 (*) >90 (mL/min)   GFR calc Af Amer 68 (*) >90 (mL/min)  URINALYSIS, ROUTINE W REFLEX MICROSCOPIC      Component Value Range   Color, Urine YELLOW  YELLOW    APPearance CLEAR  CLEAR    Specific Gravity, Urine 1.017  1.005 - 1.030    pH 5.0  5.0 - 8.0    Glucose, UA NEGATIVE  NEGATIVE (mg/dL)   Hgb urine dipstick NEGATIVE  NEGATIVE    Bilirubin Urine NEGATIVE  NEGATIVE    Ketones, ur NEGATIVE  NEGATIVE  (mg/dL)   Protein, ur NEGATIVE  NEGATIVE (mg/dL)   Urobilinogen, UA 0.2  0.0 - 1.0 (mg/dL)   Nitrite NEGATIVE  NEGATIVE    Leukocytes, UA TRACE (*) NEGATIVE   URINE MICROSCOPIC-ADD ON      Component Value Range   Squamous Epithelial / LPF FEW (*) RARE    WBC, UA 0-2  <3 (WBC/hpf)   No results found.  CBG here normal. VS normal. Pt in no distress, but does seem anxious that she cannot keep her blood sugar under 200. Explained that since they just changed her doses, it may take few days/weeks for her blood sugar to normalize. She is otherwise non toxic appearing. Encouraged healthy diet and exercising.   No diagnosis found.    MDM          Lottie Mussel, PA 10/16/11 1601  Lottie Mussel, PA 10/16/11 [phone]

## 2011-10-16 NOTE — ED Notes (Signed)
Pt presents via ems from home. She has been having generalized weakness and has been having issues with regulating her blood sugars with current prescribed therapies. She has been running high with her cbgs at home. With ems her cbg 238. Pt denies any n/v/d or any other s/s. Just having complaints of generalized malaise. Pt has hx of atrial fib and is showing transient a-fib on the monitor. Iv started pta by ems and pt received about of ns during transport here. Alert and oriented. Protocols initiated upon arrival to dept.

## 2011-10-16 NOTE — Telephone Encounter (Signed)
Pt aware to bring all meds with her to the 10/30/11 appt with MW. Pt verbalized understanding.

## 2011-10-16 NOTE — Discharge Instructions (Signed)
Your lab work here today is all normal. Make sure to continue your insulin. Exercise. Eat healthy. Follow up with your doctor as instructed.   Diabetes, Frequently Asked Questions WHAT IS DIABETES? Most of the food we eat is turned into glucose (sugar). Our bodies use it for energy. The pancreas makes a hormone called insulin. It helps glucose get into the cells of our bodies. When you have diabetes, your body either does not make enough insulin or cannot use its own insulin as well as it should. This causes sugars to build up in your blood. WHAT ARE THE SYMPTOMS OF DIABETES?  Frequent urination.   Excessive thirst.   Unexplained weight loss.   Extreme hunger.   Blurred vision.   Tingling or numbness in hands or feet.   Feeling very tired much of the time.   Dry, itchy skin.   Sores that are slow to heal.   Yeast infections.  WHAT ARE THE TYPES OF DIABETES? Type 1 Diabetes   About 10% of affected people have this type.   Usually occurs before the age of 54.   Usually occurs in thin to normal weight people.  Type 2 Diabetes  About 90% of affected people have this type.   Usually occurs after the age of 88.   Usually occurs in overweight people.   More likely to have:   A family history of diabetes.   A history of diabetes during pregnancy (gestational diabetes).   High blood pressure.   High cholesterol and triglycerides.  Gestational Diabetes  Occurs in about 4% of pregnancies.   Usually goes away after the baby is born.   More likely to occur in women with:   Family history of diabetes.   Previous gestational diabetes.   Obese.   Over 76 years old.  WHAT IS PRE-DIABETES? Pre-diabetes means your blood glucose is higher than normal, but lower than the diabetes range. It also means you are at risk of getting type 2 diabetes and heart disease. If you are told you have pre-diabetes, have your blood glucose checked again in 1 to 2 years. WHAT IS THE  TREATMENT FOR DIABETES? Treatment is aimed at keeping blood glucose near normal levels at all times. Learning how to manage this yourself is important in treating diabetes. Depending on the type of diabetes you have, your treatment will include one or more of the following:  Monitoring your blood glucose.   Meal planning.   Exercise.   Oral medicine (pills) or insulin.  CAN DIABETES BE PREVENTED? With type 1 diabetes, prevention is more difficult, because the triggers that cause it are not yet known. With type 2 diabetes, prevention is more likely, with lifestyle changes:  Maintain a healthy weight.   Eat healthy.   Exercise.  IS THERE A CURE FOR DIABETES? No, there is no cure for diabetes. There is a lot of research going on that is looking for a cure, and progress is being made. Diabetes can be treated and controlled. People with diabetes can manage their diabetes and lead normal, active lives. SHOULD I BE TESTED FOR DIABETES? If you are at least 60 years old, you should be tested for diabetes. You should be tested again every 3 years. If you are 45 or older and overweight, you may want to get tested more often. If you are younger than 45, overweight, and have one or more of the following risk factors, you should be tested:  Family history of diabetes.  Inactive lifestyle.   High blood pressure.  WHAT ARE SOME OTHER SOURCES FOR INFORMATION ON DIABETES? The following organizations may help in your search for more information on diabetes: National Diabetes Education Program (NDEP) Internet: [employer] American Diabetes Association Internet: http://www.diabetes.org  Juvenile Diabetes Foundation International Internet: [employer] Document Released: 08/06/2003 Document Revised: 04/15/2011 Document Reviewed: 05/31/2009 [facility] Patient Information 2012 [place], [place].

## 2011-10-17 NOTE — ED Provider Notes (Signed)
Medical screening examination/treatment/procedure(s) were performed by non-physician practitioner and as supervising physician I was immediately available for consultation/collaboration.   Gerhard Munch, MD 10/17/11 [phone]

## 2011-10-19 MED ORDER — CRANBERRY 500 MG PO CAPS: 1.0000 | ORAL_CAPSULE | Freq: Every day | ORAL | Status: DC

## 2011-10-19 MED ORDER — INSULIN GLARGINE 100 UNIT/ML ~~LOC~~ SOLN: 22.0000 [IU] | Freq: Every day | SUBCUTANEOUS | Status: DC

## 2011-10-19 MED ORDER — INSULIN ASPART 100 UNIT/ML ~~LOC~~ SOLN: 6.0000 [IU] | Freq: Three times a day (TID) | SUBCUTANEOUS | Status: DC

## 2011-10-19 MED ORDER — VITAMIN C 500 MG PO TABS: 500.0000 mg | ORAL_TABLET | Freq: Every day | ORAL | Status: AC

## 2011-10-19 MED ORDER — FERROUS SULFATE 325 (65 FE) MG PO TABS: 325.0000 mg | ORAL_TABLET | Freq: Every day | ORAL | Status: DC

## 2011-10-19 MED ORDER — ALBUTEROL SULFATE HFA 108 (90 BASE) MCG/ACT IN AERS: 2.0000 | INHALATION_SPRAY | RESPIRATORY_TRACT | Status: DC | PRN

## 2011-10-19 MED ORDER — CALCIUM CARBONATE 600 MG PO TABS: 600.0000 mg | ORAL_TABLET | Freq: Every day | ORAL | Status: DC

## 2011-10-19 NOTE — Progress Notes (Signed)
Addended by: Boone Master E on: 10/19/2011 01:19 PM   Modules accepted: Orders

## 2011-10-27 ENCOUNTER — Ambulatory Visit (INDEPENDENT_AMBULATORY_CARE_PROVIDER_SITE_OTHER): Payer: Self-pay | Admitting: Adult Health

## 2011-10-27 ENCOUNTER — Encounter: Payer: Self-pay | Admitting: Adult Health

## 2011-10-27 DIAGNOSIS — J45909 Unspecified asthma, uncomplicated: Secondary | ICD-10-CM

## 2011-10-27 MED ORDER — AMOXICILLIN-POT CLAVULANATE 875-125 MG PO TABS: 1.0000 | ORAL_TABLET | Freq: Two times a day (BID) | ORAL | Status: AC

## 2011-10-27 NOTE — Progress Notes (Signed)
Subjective:    Patient ID: [name], female    DOB: Sep 01, 1950, 61 y.o.   MRN: [id number]  HPI 31 yobf never smoker clinical dx with sarcoid here 1995 neg tbbx but classic cxr which gradually improved    Requiring  chronic prednisone referred 09/24/2011 to pulmonary clinic by Dr Christella Hartigan for cough.    09/24/2011 f/u ov/Wert cc recurrent cough x 6 months productive of yellow to green x sev months early in am rx [facility] with zpak, amox, and a 3rd one  But no prednisone yet. Previous ent w/u with sinus ct neg but not done since 08/22/02 and not able to cough up any mucus on day of ov at all although the problem was persistent refractory daily by hx. Thinks flovent helps but is on an immense number of meds [facility] not exactly sure how she takes them with bad HB even on dexilant. Doe x > slow adls assoc mostly with the cough >>steroid taper , PPI and pepcid , fish oil stopped , flovent stopped   10/09/2011 Follow up and Med review Pt returns for follow up and med review. We reviewed all her medications and organized them into a med calendar with pt education. She is on numerous meds and appears she is taking correctly.  Since last ov is feeling better with decreased cough and dyspnea. Feels the steroid taper helped.  No fever or chest pain  >>med cal  10/27/2011 Acute OV  Complains of prod cough with yellow/brown mucus, increased SOB, wheezing, hoarseness, scratchy throat, tightness in chest x 2 weeks. OTC not helping with cough.  Was seen in ER recently for hyperglycemia felt secondary to recent steroid taper. Reports blood sugars are back to her norma.  No chest pain or hemoptysis.     ROS:  Constitutional:   No  weight loss, night sweats,  Fevers, chills,  +fatigue, or  lassitude.  HEENT:   No headaches,  Difficulty swallowing,  Tooth/dental problems, or  Sore throat,                No sneezing, itching, ear ache,  +nasal congestion, post nasal drip,   CV:  No chest  pain,  Orthopnea, PND, swelling in lower extremities, anasarca, dizziness, palpitations, syncope.   GI  No heartburn, indigestion, abdominal pain, nausea, vomiting, diarrhea, change in bowel habits, loss of appetite, bloody stools.   Resp:   No coughing up of blood.    No chest wall deformity  Skin: no rash or lesions.  GU: no dysuria, change in color of urine, no urgency or frequency.  No flank pain, no hematuria   MS:  No joint pain or swelling.  No decreased range of motion.  No back pain.  Psych:  No change in mood or affect. No depression or anxiety.  No memory loss.          Objective:   Physical Exam  Obese amb bf nad Wt 215 09/24/2011 vs 192 at last pulmonary f/u in 2004>>215 10/09/2011 >>217 10/27/2011   HEENT: nl dentition, turbinates, and orophanx. Nl external ear canals without cough reflex   NECK :  without JVD/Nodes/TM/ nl carotid upstrokes bilaterally   LUNGS: Coarse BS w/ no wheezing    CV:  RRR  no s3 or murmur or increase in P2, no edema   ABD:  soft and nontender with nl excursion in the supine position. No bruits or organomegaly, bowel sounds nl  MS:  warm without deformities,  calf tenderness, cyanosis or clubbing  SKIN: warm and dry without lesions    NEURO:  alert, approp, no deficits   cxr 08/25/11 1. No active cardiopulmonary disease.  2. Stable prominent hilar shadows dating back to 2008. This  probably represents normal variation of the hilar vasculature.  Dr. [name]  review: no classic sarcoid changes esp on lateral view which looks wnl      Assessment & Plan:

## 2011-10-27 NOTE — Patient Instructions (Signed)
Augmentin 875mg  Twice daily  For 7 days  Mucinex DM Twice daily  As needed  Cough/congestion  Fluids and rest  Please contact office for sooner follow up if symptoms do not improve or worsen or seek emergency care  follow up Dr. [name]  As planned and As needed

## 2011-10-27 NOTE — Assessment & Plan Note (Signed)
Asthmatic Bronchitis Flare   Plan;  Augmentin 875mg  Twice daily  For 7 days  Mucinex DM Twice daily  As needed  Cough/congestion  Fluids and rest  Please contact office for sooner follow up if symptoms do not improve or worsen or seek emergency care  follow up Dr. [name]  As planned and As needed

## 2011-10-30 ENCOUNTER — Ambulatory Visit: Payer: Self-pay | Admitting: Internal Medicine

## 2011-11-10 ENCOUNTER — Telehealth: Payer: Self-pay | Admitting: Internal Medicine

## 2011-11-10 NOTE — Telephone Encounter (Signed)
Spoke with pt. She states that her cough had improved while taking abx, but now has returned and she also c/o "scratchy throat". I advised will need ov. MW first available not until 11/25/11. I have scheduled her to see TP 11/12/11 and advised uc sooner if needed. Pt verbalized understanding.

## 2011-11-12 ENCOUNTER — Ambulatory Visit (INDEPENDENT_AMBULATORY_CARE_PROVIDER_SITE_OTHER): Payer: Medicaid Other | Admitting: Adult Health

## 2011-11-12 ENCOUNTER — Encounter: Payer: Self-pay | Admitting: Adult Health

## 2011-11-12 VITALS — BP 126/74 | HR 74 | Temp 97.2°F | Ht 67.5 in | Wt 220.4 lb

## 2011-11-12 DIAGNOSIS — I4891 Unspecified atrial fibrillation: Secondary | ICD-10-CM

## 2011-11-12 DIAGNOSIS — R05 Cough: Secondary | ICD-10-CM

## 2011-11-12 DIAGNOSIS — R079 Chest pain, unspecified: Secondary | ICD-10-CM

## 2011-11-12 DIAGNOSIS — K219 Gastro-esophageal reflux disease without esophagitis: Secondary | ICD-10-CM

## 2011-11-12 NOTE — Assessment & Plan Note (Signed)
Flare , on therapy.  Recent extensive workup w/ endo last month  Will refer back to GI for persistent symptoms despite aggressive GERD tx.

## 2011-11-12 NOTE — Assessment & Plan Note (Signed)
Requests follow up with Cardiology  Will set up follow up

## 2011-11-12 NOTE — Assessment & Plan Note (Signed)
Recent flare of upper airway cough syndrome.  Will hold on steroids as this has minimal improvement and causes significant flare of hyperglycemia.  Will cont on current regimen with cough control regimen, Rhinitis /gerd prevention  Add chlor tabs At bedtime  For drainage

## 2011-11-12 NOTE — Progress Notes (Signed)
Subjective:    Patient ID: [name], female    DOB: Apr 29, 1951, 61 y.o.   MRN: [id number]  HPI 88 yobf never smoker clinical dx with sarcoid here 1995 neg tbbx but classic cxr which gradually improved    Requiring  chronic prednisone referred 09/24/2011 to pulmonary clinic by Dr Christella Hartigan for cough.    09/24/2011 f/u ov/Wert cc recurrent cough x 6 months productive of yellow to green x sev months early in am rx [facility] with zpak, amox, and a 3rd one  But no prednisone yet. Previous ent w/u with sinus ct neg but not done since 08/22/02 and not able to cough up any mucus on day of ov at all although the problem was persistent refractory daily by hx. Thinks flovent helps but is on an immense number of meds [facility] not exactly sure how she takes them with bad HB even on dexilant. Doe x > slow adls assoc mostly with the cough >>steroid taper , PPI and pepcid , fish oil stopped , flovent stopped   10/09/2011 Follow up and Med review Pt returns for follow up and med review. We reviewed all her medications and organized them into a med calendar with pt education. She is on numerous meds and appears she is taking correctly.  Since last ov is feeling better with decreased cough and dyspnea. Feels the steroid taper helped.  No fever or chest pain  >>med cal  10/27/2011 Acute OV  Complains of prod cough with yellow/brown mucus, increased SOB, wheezing, hoarseness, scratchy throat, tightness in chest x 2 weeks. OTC not helping with cough.  Was seen in ER recently for hyperglycemia felt secondary to recent steroid taper. Reports blood sugars are back to her norma.  No chest pain or hemoptysis.  >abx rx    11/12/2011 Follow up  Returns with persistent symptoms . Cough and breathing are better but cough is not gone completely. Still has intermittent  prod cough,-mainly clear to white. Some dark yellow in am but clears as day goes on. Has a lot of scratchy throat and hoarseness , drainage in  throat. Continues to get reflux into throat, sour taste. Food sticking in throat and chest. Underwent endo last month notable for mild gastrtiis. Continue on dexilant and pepcid.  Wants to make follow up with cardilogy. Does have occasional chest tightness at times. No exertional chest pain.  No hemotpysis or edema. No fever.        ROS:  Constitutional:   No  weight loss, night sweats,  Fevers, chills,  +fatigue, or  lassitude.  HEENT:   No headaches,  Difficulty swallowing,  Tooth/dental problems, or  Sore throat,                No sneezing, itching, ear ache,  +nasal congestion, post nasal drip,   CV:  No chest pain,  Orthopnea, PND, swelling in lower extremities, anasarca, dizziness, palpitations, syncope.   GI  No   abdominal pain, nausea, vomiting, diarrhea, change in bowel habits, loss of appetite, bloody stools.   Resp:   No coughing up of blood.    No chest wall deformity  Skin: no rash or lesions.  GU: no dysuria, change in color of urine, no urgency or frequency.  No flank pain, no hematuria   MS:  No joint pain or swelling.  No decreased range of motion.  No back pain.  Psych:  No change in mood or affect. No depression or anxiety.  No memory loss.          Objective:   Physical Exam  Obese amb bf nad Wt 215 09/24/2011 vs 192 at last pulmonary f/u in 2004>>215 10/09/2011 >>217 10/27/2011 >220 11/12/2011   HEENT: nl dentition, turbinates, and orophanx. Nl external ear canals without cough reflex   NECK :  without JVD/Nodes/TM/ nl carotid upstrokes bilaterally   LUNGS: Coarse BS w/ no wheezing    CV:  RRR  no s3 or murmur or increase in P2, no edema   ABD:  soft and nontender with nl excursion in the supine position. No bruits or organomegaly, bowel sounds nl  MS:  warm without deformities, calf tenderness, cyanosis or clubbing  SKIN: warm and dry without lesions    NEURO:  alert, approp, no deficits   cxr 08/25/11 1. No active cardiopulmonary disease.   2. Stable prominent hilar shadows dating back to 2008. This  probably represents normal variation of the hilar vasculature.  Dr. [name]  review: no classic sarcoid changes esp on lateral view which looks wnl      Assessment & Plan:

## 2011-11-12 NOTE — Patient Instructions (Addendum)
Continue on GERD Diet  Continue on Dexilant 60mg  daily  Continue on Pepcid 20mg  At bedtime   We will appointment with Dr. Christella Hartigan in GI to evaluate swallowing troubles.  Will set up appointment with Cardiology -Dr. [name] for your follow up  Trial of Chlorphenaramine 4mg  2 tabs At bedtime  For drainage.  follow up Dr. [name]  In 3-4 weeks and As needed   Please contact office for sooner follow up if symptoms do not improve or worsen or seek emergency care

## 2011-11-17 ENCOUNTER — Encounter: Payer: Self-pay | Admitting: Physician Assistant

## 2011-11-17 ENCOUNTER — Ambulatory Visit (INDEPENDENT_AMBULATORY_CARE_PROVIDER_SITE_OTHER): Payer: Medicaid Other | Admitting: Physician Assistant

## 2011-11-17 VITALS — BP 115/69 | HR 68 | Ht 67.0 in | Wt 220.0 lb

## 2011-11-17 DIAGNOSIS — I10 Essential (primary) hypertension: Secondary | ICD-10-CM

## 2011-11-17 DIAGNOSIS — I4891 Unspecified atrial fibrillation: Secondary | ICD-10-CM

## 2011-11-17 DIAGNOSIS — R0602 Shortness of breath: Secondary | ICD-10-CM

## 2011-11-17 LAB — BASIC METABOLIC PANEL
Calcium: 9.5 mg/dL (ref 8.4–10.5)
GFR: 53.31 mL/min — ABNORMAL LOW (ref >60.00)
Glucose, Bld: 192 mg/dL — ABNORMAL HIGH (ref 70–99)
Sodium: 139 mEq/L (ref 135–145)

## 2011-11-17 NOTE — Patient Instructions (Addendum)
Your physician recommends that you schedule a follow-up appointment in: 4-6 WEEKS WITH DR, [name]  Your physician recommends that you return for lab work in: TODAY BNP, MAGNESIUM, BMET 786.05, 427.31  NO MEDICATION CHANGES TODAY

## 2011-11-17 NOTE — Progress Notes (Signed)
[address] [place], [place]  [address] Phone: [phone] Fax:  [phone]  Date:  11/17/2011   Name:  [name]       DOB:  [birth year] MRN:  [id number]  PCP:  Dr. Andrey Campanile at [facility] Primary Cardiologist:  Dr. Charlton Haws  Primary Electrophysiologist:  None    History of Present Illness: [name] is a 61 y.o. female who presents for follow up on dyspnea and swelling.  She has a history diabetes hypertension, anemia and asthma as well as atrial fibrillation.  He atrial fibrillation was first diagnosed 08/2009.  She had another admission to the hospital and was placed on [name].  She has maintained sinus rhythm.  Dr. [name] took her off of Coumadin.  Cardiac catheterization 5/12 demonstrated normal coronary arteries.  Last echo: EF 60-65%, mild LVH, mild LAE.  She was recently by pulmonary.  She was noted to have flare of upper airway cough syndrome treated with antibiotics.  She had a prednisone taper for persistent cough in 09/2011.  CXR in 08/2011 without acute changes.  She has a ? Dx of sarcoid.  She had a recent workup with gastroenterology demonstrating gastritis.  She was felt to have a mild flare of this as well and she was referred back to gastroenterology.  She requested f/u with cardiology.  Over the last couple of weeks she has noted weight gain.  She has had weight gain of several pounds overnight.  She notes LE edema.  She describes class 3 DOE.  She sleeps on 2 pillows.  She has ? PND vs asthma exacerbation at night.  She uses her inhalers with relief.  She denies syncope.  No palps.  She has chest pain described as burning or tightness.  She notes pain assoc with meals and notes pain with exertion.  She has dysphagia.  She notes hoarseness.  She notes cough assoc with meals.    Past Medical History  Diagnosis Date  . Diabetes mellitus     type 2  . Hypertension   . Hyperlipidemia   . Paroxysmal a-fib     coumadin ( Chads2=2) Echo  09/05/10 EF 55-60%; mod LVH  . LVH (left ventricular hypertrophy)   . Asthma   . Arthritis   . Chronic headaches   . Colon polyps   . Depression   . Diverticulosis   . Fibromyalgia   . Gallstones   . IBS (irritable bowel syndrome)   . Kidney stones   . Peripheral neuropathy     Current Outpatient Prescriptions  Medication Sig Dispense Refill  . albuterol (PROVENTIL HFA;VENTOLIN HFA) 108 (90 BASE) MCG/ACT inhaler Inhale 2 puffs into the lungs every 4 (four) hours as needed for wheezing or shortness of breath.      Marland Kitchen aspirin 81 MG chewable tablet Chew 81 mg by mouth at bedtime.      . calcium carbonate (OS-CAL) 600 MG TABS Take 1 tablet (600 mg total) by mouth daily.      . Cranberry 500 MG CAPS Take 1 capsule (500 mg total) by mouth daily.      Marland Kitchen dexlansoprazole (DEXILANT) 60 MG capsule Take 60 mg by mouth daily before breakfast.       . diltiazem (TIAZAC) 180 MG 24 hr capsule Take 180 mg by mouth at bedtime.      . dofetilide ([name]) 250 MCG capsule Take 250 mcg by mouth 2 (two) times daily.       Marland Kitchen  famotidine (PEPCID) 20 MG tablet Take 20 mg by mouth at bedtime.      . ferrous sulfate 325 (65 FE) MG tablet Take 1 tablet (325 mg total) by mouth daily.      . hydroxypropyl methylcellulose (ISOPTO TEARS) 2.5 % ophthalmic solution Place 1 drop into both eyes 2 (two) times daily.       . insulin aspart (NOVOLOG) 100 UNIT/ML injection Sliding scale with each meal      . insulin glargine (LANTUS) 100 UNIT/ML injection Inject 40 Units into the skin at bedtime.      Marland Kitchen loratadine (CLARITIN) 10 MG tablet Take 10 mg by mouth every morning.      Marland Kitchen losartan-hydrochlorothiazide (HYZAAR) 100-12.5 MG per tablet Take 1 tablet by mouth every morning.      . metFORMIN (GLUCOPHAGE) 500 MG tablet Take 500 mg by mouth 2 (two) times daily with a meal.      . mirtazapine (REMERON) 15 MG tablet Take 15 mg by mouth at bedtime.      . Naphazoline-Pheniramine (OPCON-A) 0.027-0.315 % SOLN Apply 1 drop to eye 2  (two) times daily.       . niacin (NIASPAN) 500 MG CR tablet Take 500 mg by mouth at bedtime.       . nitroGLYCERIN (NITROSTAT) 0.4 MG SL tablet Place 0.4 mg under the tongue every 5 (five) minutes as needed. For chest pain.      . rizatriptan (MAXALT) 10 MG tablet Take 10 mg by mouth as needed. May repeat in 2 hours if needed      . rosuvastatin (CRESTOR) 10 MG tablet Take 10 mg by mouth at bedtime.       . traMADol (ULTRAM) 50 MG tablet Take 50 mg by mouth every 6 (six) hours as needed. For pain.      Marland Kitchen triamcinolone (NASACORT) 55 MCG/ACT nasal inhaler Place 2 sprays into the nose 2 (two) times daily.       . vitamin C (ASCORBIC ACID) 500 MG tablet Take 1 tablet (500 mg total) by mouth daily.        Allergies: Allergies  Allergen Reactions  . Amitriptyline Other (See Comments)    Disoriented.  . Hydromorphone Hcl Other (See Comments)    blood pressure drops.    History  Substance Use Topics  . Smoking status: Never Smoker   . Smokeless tobacco: Never Used  . Alcohol Use: No     ROS:  Please see the history of present illness.   No melena, hematochezia, hematuria, hemoptysis.   All other systems reviewed and negative.   PHYSICAL EXAM: VS:  BP 115/69  Pulse 68  Ht 5\' 7"  (1.702 m)  Wt 220 lb (99.791 kg)  BMI 34.46 kg/m2 Well nourished, well developed, in no acute distress HEENT: normal Neck: no JVD Vasc: no carotid bruits Cardiac:  normal S1, S2; RRR; no murmur Lungs:  clear to auscultation bilaterally, no wheezing, rhonchi or rales Abd: soft, nontender, no hepatomegaly Ext: very trace bilat LE edema Skin: warm and dry Neuro:  CNs 2-12 intact, no focal abnormalities noted  EKG:  Sinus rhythm, rate 68, normal axis T wave inversions in leads 2, 3 , aVF, V4-6 (TW changes noted on prior ECGs)  ASSESSMENT AND PLAN:  1. Shortness of breath  This is likely related to her underlying lung disease possibly being exacerbated by GERD.  She has follow up with GI soon and I have  recommended she keep this.  Her ECG  is c/w prior tracings.  She has a h/o normal LVF.  I am not aware of her having a h/o diastolic CHF.  She had normal cors by cath within the last 12 mos.  I will check a BNP.  If this is normal, no further cardiac w/u.  If abnormal, repeat echo and adjust diuretics.     2. Atrial fibrillation  Maintaining NSR.  Will check BMET and Mg2+.  Keep f/u with Dr. Charlton Haws in May 2013 as planned.   3. HYPERTENSION  Controlled.  Continue current therapy.      Luna Glasgow, PA-C  9:50 AM 11/17/2011

## 2011-11-18 ENCOUNTER — Telehealth: Payer: Self-pay | Admitting: *Deleted

## 2011-11-18 ENCOUNTER — Other Ambulatory Visit: Payer: Self-pay | Admitting: *Deleted

## 2011-11-18 DIAGNOSIS — I10 Essential (primary) hypertension: Secondary | ICD-10-CM

## 2011-11-18 MED ORDER — POTASSIUM CHLORIDE CRYS ER 10 MEQ PO TBCR: 10.0000 meq | EXTENDED_RELEASE_TABLET | Freq: Every day | ORAL | Status: DC

## 2011-11-18 NOTE — Telephone Encounter (Signed)
pt notified of lab results and to start K+ 10 meq daily. Danielle Rankin

## 2011-11-18 NOTE — Telephone Encounter (Signed)
Message copied by Tarri Fuller on Wed Nov 18, 2011  4:43 PM ------      Message from: [place], [place] T      Created: Wed Nov 18, 2011  4:25 PM       No sign of CHF.      Mg ok      K+ needs to be 4 while taking [name]      I do not see K+ on her med list      Start K+ 10 mEq daily.      Repeat bmet in one week.      Tereso Newcomer, PA-C  4:25 PM 11/18/2011

## 2011-11-18 NOTE — Progress Notes (Signed)
Addended by: Tarri Fuller on: 11/18/2011 05:18 PM   Modules accepted: Orders

## 2011-11-23 ENCOUNTER — Other Ambulatory Visit (INDEPENDENT_AMBULATORY_CARE_PROVIDER_SITE_OTHER): Payer: Medicaid Other

## 2011-11-23 ENCOUNTER — Telehealth: Payer: Self-pay | Admitting: *Deleted

## 2011-11-23 ENCOUNTER — Telehealth: Payer: Self-pay | Admitting: Physician Assistant

## 2011-11-23 DIAGNOSIS — I10 Essential (primary) hypertension: Secondary | ICD-10-CM

## 2011-11-23 LAB — BASIC METABOLIC PANEL
BUN: 18 mg/dL (ref 6–23)
Calcium: 9.6 mg/dL (ref 8.4–10.5)
Creatinine, Ser: 1.3 mg/dL — ABNORMAL HIGH (ref 0.4–1.2)
GFR: 56.27 mL/min — ABNORMAL LOW (ref >60.00)
Glucose, Bld: 211 mg/dL — ABNORMAL HIGH (ref 70–99)
Sodium: 139 mEq/L (ref 135–145)

## 2011-11-23 NOTE — Telephone Encounter (Signed)
lmom K+ ok and creatnine stable, repeat bmet put in for 12/15/11 Danielle Rankin

## 2011-11-23 NOTE — Telephone Encounter (Signed)
Message copied by Tarri Fuller on Mon Nov 23, 2011  3:03 PM ------      Message from: [place], [place] T      Created: Mon Nov 23, 2011  1:00 PM       K+ good      Creatinine stable      Repeat BMET in 3 weeks      Tereso Newcomer, PA-C  12:58 PM 11/23/2011

## 2011-11-23 NOTE — Telephone Encounter (Signed)
Fu call °Pt returning your call  °

## 2011-11-23 NOTE — Telephone Encounter (Signed)
cb pt from pt rtning my call about lab results, K+ is ok creatnine is stable. repeat bmet 12/15/11. [name]

## 2011-11-26 ENCOUNTER — Other Ambulatory Visit: Payer: Medicaid Other

## 2011-12-02 ENCOUNTER — Encounter: Payer: Self-pay | Admitting: Gastroenterology

## 2011-12-02 ENCOUNTER — Ambulatory Visit (INDEPENDENT_AMBULATORY_CARE_PROVIDER_SITE_OTHER): Payer: Medicaid Other | Admitting: Gastroenterology

## 2011-12-02 VITALS — BP 116/60 | HR 80 | Ht 67.0 in | Wt 218.4 lb

## 2011-12-02 DIAGNOSIS — R490 Dysphonia: Secondary | ICD-10-CM

## 2011-12-02 DIAGNOSIS — J029 Acute pharyngitis, unspecified: Secondary | ICD-10-CM

## 2011-12-02 DIAGNOSIS — IMO0002 Reserved for concepts with insufficient information to code with codable children: Secondary | ICD-10-CM

## 2011-12-02 DIAGNOSIS — T17308A Unspecified foreign body in larynx causing other injury, initial encounter: Secondary | ICD-10-CM

## 2011-12-02 MED ORDER — ESOMEPRAZOLE MAGNESIUM 40 MG PO CPDR: 40.0000 mg | DELAYED_RELEASE_CAPSULE | Freq: Two times a day (BID) | ORAL | Status: DC

## 2011-12-02 NOTE — Progress Notes (Signed)
Review of pertinent gastrointestinal problems: 1. chronic GERD. EGD February 2013 showed mild gastritis, H. pylori negative on biopsy. On proton pump inhibitor once daily and nightly Zantac 2. history of adenomatous polyps:  Colonoscopy February 2013 found diverticulosis but no polyps. Recall colonoscopy at 5 year interval given 2008 adenomatous polyps  HPI: This is a  61 year old woman whom I last saw the time of upper and lower endoscopies. See those results summarized above.  Takes dexilant in AM, pepcid at bedtime.  She is hoarse, has been hoarse for months and months and months.  Was intermittent in past but more constant. She sometimes   Drinks a lot of fluids.  Feels like food, liquids sometimes goes down into her lungs (couple times a week).  She is coughing a lot as well.  She complains of hoarseness. He complained of her breast being taken away at times. She had an episode of this while she was in my office and it was quite unusual.    Past Medical History  Diagnosis Date  . Diabetes mellitus     type 2  . Hypertension   . Hyperlipidemia   . Paroxysmal a-fib     coumadin ( Chads2=2) Echo 09/05/10 EF 55-60%; mod LVH  . LVH (left ventricular hypertrophy)   . Asthma   . Arthritis   . Chronic headaches   . Colon polyps   . Depression   . Diverticulosis   . Fibromyalgia   . Gallstones   . IBS (irritable bowel syndrome)   . Kidney stones   . Peripheral neuropathy     Past Surgical History  Procedure Date  . Left ankle fracture     closed reduction March 2008  . Wrist arthroscopy     June 2003  . Carpal tunnel release 2003    bilateral  . Subcutaneous transposition of left ulnar nerve   . Abdominal hysterectomy   . Cholecystectomy   . Colon surgery   . Elbow surgery     bilateral    Current Outpatient Prescriptions  Medication Sig Dispense Refill  . albuterol (PROVENTIL HFA;VENTOLIN HFA) 108 (90 BASE) MCG/ACT inhaler Inhale 2 puffs into the lungs every 4  (four) hours as needed for wheezing or shortness of breath.      Marland Kitchen aspirin 81 MG chewable tablet Chew 81 mg by mouth at bedtime.      . benzonatate (TESSALON) 100 MG capsule Take 100 mg by mouth 3 (three) times daily as needed.      . calcium carbonate (OS-CAL) 600 MG TABS Take 1 tablet (600 mg total) by mouth daily.      . Cranberry 500 MG CAPS Take 1 capsule (500 mg total) by mouth daily.      Marland Kitchen dexlansoprazole (DEXILANT) 60 MG capsule Take 60 mg by mouth daily before breakfast.       . diltiazem (TIAZAC) 180 MG 24 hr capsule Take 180 mg by mouth at bedtime.      . dofetilide ([name]) 250 MCG capsule Take 250 mcg by mouth 2 (two) times daily.       . famotidine (PEPCID) 20 MG tablet Take 20 mg by mouth at bedtime.      . ferrous sulfate 325 (65 FE) MG tablet Take 1 tablet (325 mg total) by mouth daily.      . hydroxypropyl methylcellulose (ISOPTO TEARS) 2.5 % ophthalmic solution Place 1 drop into both eyes 2 (two) times daily.       . insulin aspart (  NOVOLOG) 100 UNIT/ML injection Sliding scale with each meal      . insulin glargine (LANTUS) 100 UNIT/ML injection Inject 40 Units into the skin at bedtime.      Marland Kitchen loratadine (CLARITIN) 10 MG tablet Take 10 mg by mouth every morning.      Marland Kitchen losartan-hydrochlorothiazide (HYZAAR) 100-12.5 MG per tablet Take 1 tablet by mouth every morning.      . metFORMIN (GLUCOPHAGE) 500 MG tablet Take 500 mg by mouth 2 (two) times daily with a meal.      . mirtazapine (REMERON) 15 MG tablet Take 15 mg by mouth at bedtime.      . Naphazoline-Pheniramine (OPCON-A) 0.027-0.315 % SOLN Apply 1 drop to eye 2 (two) times daily.       . niacin (NIASPAN) 500 MG CR tablet Take 500 mg by mouth at bedtime.       . nitroGLYCERIN (NITROSTAT) 0.4 MG SL tablet Place 0.4 mg under the tongue every 5 (five) minutes as needed. For chest pain.      . potassium chloride (K-DUR,KLOR-CON) 10 MEQ tablet Take 1 tablet (10 mEq total) by mouth daily.  30 tablet  11  . rizatriptan (MAXALT)  10 MG tablet Take 10 mg by mouth as needed. May repeat in 2 hours if needed      . rosuvastatin (CRESTOR) 10 MG tablet Take 10 mg by mouth at bedtime.       . traMADol (ULTRAM) 50 MG tablet Take 50 mg by mouth every 6 (six) hours as needed. For pain.      Marland Kitchen triamcinolone (NASACORT) 55 MCG/ACT nasal inhaler Place 2 sprays into the nose 2 (two) times daily.       . vitamin C (ASCORBIC ACID) 500 MG tablet Take 1 tablet (500 mg total) by mouth daily.        Allergies as of 12/02/2011 - Review Complete 12/02/2011  Allergen Reaction Noted  . Amitriptyline Other (See Comments) 08/25/2011  . Hydromorphone hcl Other (See Comments)     Family History  Problem Relation Age of Onset  . Heart attack Mother     @ age 12  . Heart attack Brother     @ age 19  . Colon cancer Maternal Aunt   . Mental illness Mother   . Prostate cancer Maternal Grandfather   . Ovarian cancer Maternal Aunt     and cousin  . Diabetes Mother   . Diabetes      siblings  . Hypertension Mother     siblings  . Lupus Sister     History   Social History  . Marital Status: Divorced    Spouse Name: N/A    Number of Children: 3  . Years of Education: N/A   Occupational History  . disabled     CNA   Social History Main Topics  . Smoking status: Never Smoker   . Smokeless tobacco: Never Used  . Alcohol Use: No  . Drug Use: No  . Sexually Active: Not on file   Other Topics Concern  . Not on file   Social History Narrative  . No narrative on file      Physical Exam: BP 116/60  Pulse 80  Ht 5\' 7"  (1.702 m)  Wt 218 lb 6 oz (99.054 kg)  BMI 34.20 kg/m2 Constitutional: generally well-appearing Psychiatric: alert and oriented x3 Abdomen: soft, nontender, nondistended, no obvious ascites, no peritoneal signs, normal bowel sounds     Assessment and plan: 61  y.o. female with GERD, question extra esophageal effects of GERD  She is on very strong proton pump inhibitor daily as well to H2 blocker  nightly. This is about maximum acid suppression. I'm going to change her dexilant to Nexium twice daily to see if she just responds better to Nexium.  I do not think the symptoms she describes are related to her esophagus and stomach. She really has hoarseness, choking sensation, acute episodes of breathing trouble. In going to refer her to ear nose and throat. She will return to see me in one to 2 months.

## 2011-12-02 NOTE — Patient Instructions (Addendum)
ENT referral for hoarseness, sore throat, choking.  Lupus Ear Nose Throat.  We will contact your primary care provider so that they can refer you to an ENT physician. It is not clear that your coughing, throat problems are related to acid/refluxing. Change your antiacid medicines, stop the dexilant, start nexium twice daily (best taken 20-30 min prior to BF and dinner meals). Continue on pepcid at bedtime. Return to see Dr. Christella Hartigan in 6-7 weeks.

## 2011-12-11 ENCOUNTER — Telehealth: Payer: Self-pay | Admitting: Gastroenterology

## 2011-12-11 NOTE — Telephone Encounter (Signed)
Pt will call her insurance company and see what they will cover and call back

## 2011-12-14 ENCOUNTER — Other Ambulatory Visit: Payer: Self-pay | Admitting: Cardiovascular Disease

## 2011-12-14 ENCOUNTER — Telehealth: Payer: Self-pay | Admitting: Gastroenterology

## 2011-12-14 NOTE — Telephone Encounter (Signed)
Pt has not gotten any information from her insurance yet, she has her Child psychotherapist checking into it.  I have left her a months worth of samples at the front desk she will pick them up today

## 2011-12-15 ENCOUNTER — Other Ambulatory Visit (INDEPENDENT_AMBULATORY_CARE_PROVIDER_SITE_OTHER): Payer: Medicaid Other

## 2011-12-15 DIAGNOSIS — I10 Essential (primary) hypertension: Secondary | ICD-10-CM

## 2011-12-16 LAB — BASIC METABOLIC PANEL
Calcium: 9.3 mg/dL (ref 8.4–10.5)
Chloride: 106 mEq/L (ref 96–112)
Creatinine, Ser: 1.3 mg/dL — ABNORMAL HIGH (ref 0.4–1.2)
GFR: 56.26 mL/min — ABNORMAL LOW (ref >60.00)

## 2011-12-17 ENCOUNTER — Ambulatory Visit (INDEPENDENT_AMBULATORY_CARE_PROVIDER_SITE_OTHER): Payer: Medicaid Other | Admitting: Cardiovascular Disease

## 2011-12-17 ENCOUNTER — Encounter: Payer: Self-pay | Admitting: Cardiovascular Disease

## 2011-12-17 ENCOUNTER — Telehealth: Payer: Self-pay | Admitting: *Deleted

## 2011-12-17 VITALS — BP 122/74 | HR 68 | Wt 218.0 lb

## 2011-12-17 DIAGNOSIS — E119 Type 2 diabetes mellitus without complications: Secondary | ICD-10-CM

## 2011-12-17 DIAGNOSIS — I10 Essential (primary) hypertension: Secondary | ICD-10-CM

## 2011-12-17 DIAGNOSIS — E785 Hyperlipidemia, unspecified: Secondary | ICD-10-CM

## 2011-12-17 DIAGNOSIS — I4891 Unspecified atrial fibrillation: Secondary | ICD-10-CM

## 2011-12-17 DIAGNOSIS — R079 Chest pain, unspecified: Secondary | ICD-10-CM

## 2011-12-17 MED ORDER — POTASSIUM CHLORIDE CRYS ER 10 MEQ PO TBCR: 10.0000 meq | EXTENDED_RELEASE_TABLET | Freq: Every day | ORAL | Status: DC

## 2011-12-17 MED ORDER — NITROGLYCERIN 0.4 MG SL SUBL: 0.4000 mg | SUBLINGUAL_TABLET | SUBLINGUAL | Status: DC | PRN

## 2011-12-17 NOTE — Patient Instructions (Signed)
Your physician wants you to follow-up in: YEAR WITH DR NISHAN  You will receive a reminder letter in the mail two months in advance. If you don't receive a letter, please call our office to schedule the follow-up appointment.  Your physician recommends that you continue on your current medications as directed. Please refer to the Current Medication list given to you today. 

## 2011-12-17 NOTE — Assessment & Plan Note (Signed)
Atypical normal cath 2012 observe

## 2011-12-17 NOTE — Telephone Encounter (Signed)
lmom labs stable 

## 2011-12-17 NOTE — Progress Notes (Signed)
Patient ID: [name], female   DOB: 1951/05/11, 61 y.o.   MRN: [id number] She has a history diabetes hypertension, anemia and asthma as well as atrial fibrillation. He atrial fibrillation was first diagnosed 08/2009. She had another admission to the hospital and was placed on [name]. She has maintained sinus rhythm. Dr. [name] took her off of Coumadin. Cardiac catheterization 5/12 demonstrated normal coronary arteries. Last echo: EF 60-65%, mild LVH, mild LAE. She was recently by pulmonary. She was noted to have flare of upper airway cough syndrome treated with antibiotics. She had a prednisone taper for persistent cough in 09/2011. CXR in 08/2011 without acute changes. She has a ? Dx of sarcoid.  Prominent mediastinal nodes but "not active"  She had a recent workup with gastroenterology demonstrating gastritis. She was felt to have a mild flare of this as well and she was referred back to gastroenterology.  Dyspnea from obesity and deconditioning.  BNP normal and EF normal.  No evidence of recurrent afib  ROS: Denies fever, malais, weight loss, blurry vision, decreased visual acuity, cough, sputum, SOB, hemoptysis, pleuritic pain, palpitaitons, heartburn, abdominal pain, melena, lower extremity edema, claudication, or rash.  All other systems reviewed and negative  General: Affect appropriate Healthy:  appears stated age HEENT: normal Neck supple with no adenopathy JVP normal no bruits no thyromegaly Lungs clear with no wheezing and good diaphragmatic motion Heart:  S1/S2 no murmur, no rub, gallop or click PMI normal Abdomen: benighn, BS positve, no tenderness, no AAA no bruit.  No HSM or HJR Distal pulses intact with no bruits No edema Neuro non-focal Skin warm and dry No muscular weakness   Current Outpatient Prescriptions  Medication Sig Dispense Refill  . albuterol (PROVENTIL HFA;VENTOLIN HFA) 108 (90 BASE) MCG/ACT inhaler Inhale 2 puffs into the lungs every 4 (four) hours as needed  for wheezing or shortness of breath.      Marland Kitchen aspirin 81 MG chewable tablet Chew 81 mg by mouth at bedtime.      . benzonatate (TESSALON) 100 MG capsule Take 100 mg by mouth 3 (three) times daily as needed.      . calcium carbonate (OS-CAL) 600 MG TABS Take 1 tablet (600 mg total) by mouth daily.      . Cranberry 500 MG CAPS Take 1 capsule (500 mg total) by mouth daily.      Marland Kitchen diltiazem (TIAZAC) 180 MG 24 hr capsule Take 180 mg by mouth at bedtime.      . dofetilide ([name]) 250 MCG capsule Take 250 mcg by mouth 2 (two) times daily.       Marland Kitchen esomeprazole (NEXIUM) 40 MG capsule Take 1 capsule (40 mg total) by mouth 2 (two) times daily before a meal.  60 capsule  3  . famotidine (PEPCID) 20 MG tablet Take 20 mg by mouth at bedtime.      . ferrous sulfate 325 (65 FE) MG tablet Take 1 tablet (325 mg total) by mouth daily.      . hydroxypropyl methylcellulose (ISOPTO TEARS) 2.5 % ophthalmic solution Place 1 drop into both eyes 2 (two) times daily.       . insulin aspart (NOVOLOG) 100 UNIT/ML injection Sliding scale with each meal      . insulin glargine (LANTUS) 100 UNIT/ML injection Inject 40 Units into the skin at bedtime.      Marland Kitchen loratadine (CLARITIN) 10 MG tablet Take 10 mg by mouth every morning.      Marland Kitchen losartan-hydrochlorothiazide (HYZAAR) 100-12.5  MG per tablet Take 1 tablet by mouth every morning.      . metFORMIN (GLUCOPHAGE) 500 MG tablet Take 500 mg by mouth 2 (two) times daily with a meal.      . mirtazapine (REMERON) 15 MG tablet Take 15 mg by mouth at bedtime.      . Naphazoline-Pheniramine (OPCON-A) 0.027-0.315 % SOLN Apply 1 drop to eye 2 (two) times daily.       . niacin (NIASPAN) 500 MG CR tablet Take 500 mg by mouth at bedtime.       . nitroGLYCERIN (NITROSTAT) 0.4 MG SL tablet Place 1 tablet (0.4 mg total) under the tongue every 5 (five) minutes as needed. For chest pain.  25 tablet  4  . potassium chloride (K-DUR,KLOR-CON) 10 MEQ tablet Take 1 tablet (10 mEq total) by mouth daily.   30 tablet  11  . rizatriptan (MAXALT) 10 MG tablet Take 10 mg by mouth as needed. May repeat in 2 hours if needed      . rosuvastatin (CRESTOR) 10 MG tablet Take 10 mg by mouth at bedtime.       . traMADol (ULTRAM) 50 MG tablet Take 50 mg by mouth every 6 (six) hours as needed. For pain.      Marland Kitchen triamcinolone (NASACORT) 55 MCG/ACT nasal inhaler Place 2 sprays into the nose 2 (two) times daily.       . vitamin C (ASCORBIC ACID) 500 MG tablet Take 1 tablet (500 mg total) by mouth daily.      Marland Kitchen DISCONTD: nitroGLYCERIN (NITROSTAT) 0.4 MG SL tablet Place 0.4 mg under the tongue every 5 (five) minutes as needed. For chest pain.      Marland Kitchen DISCONTD: potassium chloride (K-DUR,KLOR-CON) 10 MEQ tablet Take 1 tablet (10 mEq total) by mouth daily.  30 tablet  11    Allergies  Amitriptyline and Hydromorphone hcl  Electrocardiogram:  NSR rate68  Low voltage nonspecific ST/T wave changes  Assessment and Plan

## 2011-12-17 NOTE — Assessment & Plan Note (Signed)
Well controlled.  Continue current medications and low sodium Dash type diet.    

## 2011-12-17 NOTE — Telephone Encounter (Signed)
Message copied by Tarri Fuller on Thu Dec 17, 2011 11:20 AM ------      Message from: [place], [place] T      Created: Wed Dec 16, 2011  5:32 PM       Stable      Tereso Newcomer, [place]  5:32 PM 12/16/2011

## 2011-12-17 NOTE — Assessment & Plan Note (Signed)
Discussed low carb diet.  Target hemoglobin A1c is 6.5 or less.  Continue current medications.  

## 2011-12-17 NOTE — Assessment & Plan Note (Signed)
Maint NSR continue current meds 

## 2011-12-17 NOTE — Assessment & Plan Note (Signed)
Cholesterol is at goal.  Continue current dose of statin and diet Rx.  No myalgias or side effects.  F/U  LFT's in 6 months. Lab Results  Component Value Date   LDLCALC  Value: UNABLE TO CALCULATE IF TRIGLYCERIDE OVER 400 mg/dL        Total Cholesterol/HDL:CHD Risk Coronary Heart Disease Risk Table                     Men   Women  1/2 Average Risk   3.4   3.3  Average Risk       5.0   4.4  2 X Average Risk   9.6   7.1  3 X Average Risk  23.4   11.0        Use the calculated Patient Ratio above and the CHD Risk Table to determine the patient's CHD Risk.        ATP III CLASSIFICATION (LDL):  <100     mg/dL   Optimal  100-129  mg/dL   Near or Above                    Optimal  130-159  mg/dL   Borderline  160-189  mg/dL   High  >190     mg/dL   Very High 09/08/2010             

## 2011-12-25 ENCOUNTER — Telehealth: Payer: Self-pay | Admitting: Cardiovascular Disease

## 2011-12-25 NOTE — Telephone Encounter (Signed)
ORDERED [name]  THRU CARE CONNECTION WILL ARRIVE IN 7-10 DAYS  PT ID  NUMBER [id number] LEFT MESSAGE  ON VOICE MAIL   RE ORDERING [name] ./CY

## 2011-12-25 NOTE — Telephone Encounter (Signed)
Patient returned nurse call, I relayed the msg that [name] med reordered.  Patient wanted to make sure Nurse CY knew that she said thank you and have a good weekend.  No return call needed at this point.  :0)

## 2011-12-25 NOTE — Telephone Encounter (Signed)
Patient request return call at [phone]  Patient was told to call nurse when its time to reorder [facility]  Med.

## 2011-12-29 NOTE — Telephone Encounter (Signed)
[name] RECEIVED TODAY , PT AWARE WILL LEAVE  AT FRONT DESK  FOR PICK UP .[name]

## 2012-01-15 ENCOUNTER — Encounter: Payer: Self-pay | Admitting: Gastroenterology

## 2012-01-15 ENCOUNTER — Ambulatory Visit (INDEPENDENT_AMBULATORY_CARE_PROVIDER_SITE_OTHER): Payer: Medicaid Other | Admitting: Gastroenterology

## 2012-01-15 VITALS — BP 110/60 | HR 72 | Ht 67.0 in | Wt 219.6 lb

## 2012-01-15 DIAGNOSIS — K219 Gastro-esophageal reflux disease without esophagitis: Secondary | ICD-10-CM

## 2012-01-15 MED ORDER — PANTOPRAZOLE SODIUM 40 MG PO TBEC: 40.0000 mg | DELAYED_RELEASE_TABLET | Freq: Two times a day (BID) | ORAL | Status: DC

## 2012-01-15 NOTE — Patient Instructions (Signed)
Increased you protonix to twice daily. Take one pille 20-30 min before breakfast and dinner meals. Also take one pepcid/zantac at bedtime. Call here in 5-6 weeks to report on your symptoms.

## 2012-01-15 NOTE — Progress Notes (Signed)
Review of pertinent gastrointestinal problems:  1. chronic GERD. EGD February 2013 showed mild gastritis, H. pylori negative on biopsy. On proton pump inhibitor once daily and nightly Zantac  2. history of adenomatous polyps: Colonoscopy February 2013 found diverticulosis but no polyps. Recall colonoscopy at 5 year interval given 2008 adenomatous polyps 3. hoarseness, coughing, intermittent shortness of breath; not clear if this is related to her GERD but seems unlikely (5/13); was referred to ear nose and throat physician   HPI: This is a  pleasant 61 year old woman whom I last saw 5-6 weeks ago.   Has not yet seen ENT, appt coming up in 2 weeks for her hoarseness.  Medicaid will not pay for nexium.  She was given protonix instead and says it doesn't work at all.  Was written for twice daily nexium, but is only getting once daily protonix.  nexium was helping with belching,  Burning, laying down at night.  Twice daily nexium helped. But once daily protonix not helping.  Also on pepcid qhs.   Past Medical History  Diagnosis Date  . Diabetes mellitus     type 2  . Hypertension   . Hyperlipidemia   . Paroxysmal a-fib     coumadin ( Chads2=2) Echo 09/05/10 EF 55-60%; mod LVH  . LVH (left ventricular hypertrophy)   . Asthma   . Arthritis   . Chronic headaches   . Colon polyps   . Depression   . Diverticulosis   . Fibromyalgia   . Gallstones   . IBS (irritable bowel syndrome)   . Kidney stones   . Peripheral neuropathy     Past Surgical History  Procedure Date  . Left ankle fracture     closed reduction March 2008  . Wrist arthroscopy     June 2003  . Carpal tunnel release 2003    bilateral  . Subcutaneous transposition of left ulnar nerve   . Abdominal hysterectomy   . Cholecystectomy   . Colon surgery   . Elbow surgery     bilateral    Current Outpatient Prescriptions  Medication Sig Dispense Refill  . albuterol (PROVENTIL HFA;VENTOLIN HFA) 108 (90 BASE) MCG/ACT  inhaler Inhale 2 puffs into the lungs every 4 (four) hours as needed for wheezing or shortness of breath.      Marland Kitchen aspirin 81 MG chewable tablet Chew 81 mg by mouth at bedtime.      . benzonatate (TESSALON) 100 MG capsule Take 100 mg by mouth 3 (three) times daily as needed.      . calcium carbonate (OS-CAL) 600 MG TABS Take 1 tablet (600 mg total) by mouth daily.      . Cranberry 500 MG CAPS Take 1 capsule (500 mg total) by mouth daily.      Marland Kitchen diltiazem (TIAZAC) 180 MG 24 hr capsule Take 180 mg by mouth at bedtime.      . dofetilide ([name]) 250 MCG capsule Take 250 mcg by mouth 2 (two) times daily.       Marland Kitchen esomeprazole (NEXIUM) 40 MG capsule Take 1 capsule (40 mg total) by mouth 2 (two) times daily before a meal.  60 capsule  3  . famotidine (PEPCID) 20 MG tablet Take 20 mg by mouth at bedtime.      . ferrous sulfate 325 (65 FE) MG tablet Take 1 tablet (325 mg total) by mouth daily.      . fluticasone (VERAMYST) 27.5 MCG/SPRAY nasal spray Place 2 sprays into the nose daily.      Marland Kitchen  hydroxypropyl methylcellulose (ISOPTO TEARS) 2.5 % ophthalmic solution Place 1 drop into both eyes 2 (two) times daily.       . insulin aspart (NOVOLOG) 100 UNIT/ML injection Sliding scale with each meal      . insulin glargine (LANTUS) 100 UNIT/ML injection Inject 40 Units into the skin at bedtime.      Marland Kitchen loratadine (CLARITIN) 10 MG tablet Take 10 mg by mouth every morning.      Marland Kitchen losartan-hydrochlorothiazide (HYZAAR) 100-12.5 MG per tablet Take 1 tablet by mouth every morning.      . metFORMIN (GLUCOPHAGE) 500 MG tablet Take 500 mg by mouth 2 (two) times daily with a meal.      . mirtazapine (REMERON) 15 MG tablet Take 15 mg by mouth at bedtime.      . Naphazoline-Pheniramine (OPCON-A) 0.027-0.315 % SOLN Apply 1 drop to eye 2 (two) times daily.       . niacin (NIASPAN) 500 MG CR tablet Take 500 mg by mouth at bedtime.       . nitroGLYCERIN (NITROSTAT) 0.4 MG SL tablet Place 1 tablet (0.4 mg total) under the tongue  every 5 (five) minutes as needed. For chest pain.  25 tablet  4  . pantoprazole (PROTONIX) 40 MG tablet Take 40 mg by mouth daily.      . potassium chloride (K-DUR,KLOR-CON) 10 MEQ tablet Take 1 tablet (10 mEq total) by mouth daily.  30 tablet  11  . rizatriptan (MAXALT) 10 MG tablet Take 10 mg by mouth as needed. May repeat in 2 hours if needed      . rosuvastatin (CRESTOR) 10 MG tablet Take 10 mg by mouth at bedtime.       . traMADol (ULTRAM) 50 MG tablet Take 50 mg by mouth every 6 (six) hours as needed. For pain.      Marland Kitchen triamcinolone (NASACORT) 55 MCG/ACT nasal inhaler Place 2 sprays into the nose 2 (two) times daily.       . vitamin C (ASCORBIC ACID) 500 MG tablet Take 1 tablet (500 mg total) by mouth daily.        Allergies as of 01/15/2012 - Review Complete 01/15/2012  Allergen Reaction Noted  . Amitriptyline Other (See Comments) 08/25/2011  . Hydromorphone hcl Other (See Comments)     Family History  Problem Relation Age of Onset  . Heart attack Mother     @ age 60  . Heart attack Brother     @ age 22  . Colon cancer Maternal Aunt   . Mental illness Mother   . Prostate cancer Maternal Grandfather   . Ovarian cancer Maternal Aunt     and cousin  . Diabetes Mother   . Diabetes      siblings  . Hypertension Mother     siblings  . Lupus Sister     History   Social History  . Marital Status: Divorced    Spouse Name: N/A    Number of Children: 3  . Years of Education: N/A   Occupational History  . disabled     CNA   Social History Main Topics  . Smoking status: Never Smoker   . Smokeless tobacco: Never Used  . Alcohol Use: No  . Drug Use: No  . Sexually Active: Not on file   Other Topics Concern  . Not on file   Social History Narrative  . No narrative on file      Physical Exam: BP 110/60  Pulse  72  Ht 5\' 7"  (1.702 m)  Wt 219 lb 9.6 oz (99.61 kg)  BMI 34.39 kg/m2 Constitutional: generally well-appearing Psychiatric: alert and oriented  x3 Abdomen: soft, nontender, nondistended, no obvious ascites, no peritoneal signs, normal bowel sounds     Assessment and plan: 61 y.o. female with some classic GERD symptoms and also other symptoms that are not clearly related to GERD  Twice-daily Nexium was helping her symptoms. Medicaid will not pay for Nexium. She was instead given once daily protonic square and going to increase her Protonix to twice daily and hopefully this will help just as well as twice daily Nexium. She will take 1 pill 20-30 minutes before breakfast and dinner meal and continue her H2 blocker at bedtime. She will call to report on her symptoms in 5-6 weeks.

## 2012-02-08 ENCOUNTER — Ambulatory Visit (HOSPITAL_COMMUNITY)
Admission: RE | Admit: 2012-02-08 | Discharge: 2012-02-08 | Disposition: A | Discharge status: Home / Self Care | Payer: Medicaid Other | Source: Ambulatory Visit | Attending: Otolaryngology | Admitting: Otolaryngology

## 2012-02-08 DIAGNOSIS — R51 Headache: Secondary | ICD-10-CM | POA: Insufficient documentation

## 2012-02-08 DIAGNOSIS — R1310 Dysphagia, unspecified: Secondary | ICD-10-CM | POA: Insufficient documentation

## 2012-02-08 DIAGNOSIS — E119 Type 2 diabetes mellitus without complications: Secondary | ICD-10-CM | POA: Insufficient documentation

## 2012-02-08 DIAGNOSIS — J45909 Unspecified asthma, uncomplicated: Secondary | ICD-10-CM | POA: Insufficient documentation

## 2012-02-08 DIAGNOSIS — G609 Hereditary and idiopathic neuropathy, unspecified: Secondary | ICD-10-CM | POA: Insufficient documentation

## 2012-02-08 DIAGNOSIS — R1319 Other dysphagia: Secondary | ICD-10-CM | POA: Insufficient documentation

## 2012-02-08 DIAGNOSIS — J029 Acute pharyngitis, unspecified: Secondary | ICD-10-CM

## 2012-02-08 DIAGNOSIS — E785 Hyperlipidemia, unspecified: Secondary | ICD-10-CM | POA: Insufficient documentation

## 2012-02-08 DIAGNOSIS — K5730 Diverticulosis of large intestine without perforation or abscess without bleeding: Secondary | ICD-10-CM | POA: Insufficient documentation

## 2012-02-08 DIAGNOSIS — Z90710 Acquired absence of both cervix and uterus: Secondary | ICD-10-CM | POA: Insufficient documentation

## 2012-02-08 DIAGNOSIS — K589 Irritable bowel syndrome without diarrhea: Secondary | ICD-10-CM | POA: Insufficient documentation

## 2012-02-08 DIAGNOSIS — K8020 Calculus of gallbladder without cholecystitis without obstruction: Secondary | ICD-10-CM | POA: Insufficient documentation

## 2012-02-08 DIAGNOSIS — I4891 Unspecified atrial fibrillation: Secondary | ICD-10-CM | POA: Insufficient documentation

## 2012-02-08 DIAGNOSIS — F329 Major depressive disorder, single episode, unspecified: Secondary | ICD-10-CM | POA: Insufficient documentation

## 2012-02-08 DIAGNOSIS — Z87442 Personal history of urinary calculi: Secondary | ICD-10-CM | POA: Insufficient documentation

## 2012-02-08 DIAGNOSIS — Z860100 Personal history of colon polyps, unspecified: Secondary | ICD-10-CM | POA: Insufficient documentation

## 2012-02-08 DIAGNOSIS — I10 Essential (primary) hypertension: Secondary | ICD-10-CM | POA: Insufficient documentation

## 2012-02-08 DIAGNOSIS — Z7901 Long term (current) use of anticoagulants: Secondary | ICD-10-CM | POA: Insufficient documentation

## 2012-02-08 DIAGNOSIS — IMO0001 Reserved for inherently not codable concepts without codable children: Secondary | ICD-10-CM | POA: Insufficient documentation

## 2012-02-08 DIAGNOSIS — Z9889 Other specified postprocedural states: Secondary | ICD-10-CM | POA: Insufficient documentation

## 2012-02-08 DIAGNOSIS — F3289 Other specified depressive episodes: Secondary | ICD-10-CM | POA: Insufficient documentation

## 2012-02-08 DIAGNOSIS — I517 Cardiomegaly: Secondary | ICD-10-CM | POA: Insufficient documentation

## 2012-02-08 NOTE — Procedures (Signed)
Objective Swallowing Evaluation: Modified Barium Swallowing Study  Patient Details  Name: [name] MRN: [id number] Date of Birth: 01-08-1951  Today's Date: 02/08/2012 Time: 1130-1200 SLP Time Calculation (min): 30 min  Past Medical History:  Past Medical History  Diagnosis Date  . Diabetes mellitus     type 2  . Hypertension   . Hyperlipidemia   . Paroxysmal a-fib     coumadin ( Chads2=2) Echo 09/05/10 EF 55-60%; mod LVH  . LVH (left ventricular hypertrophy)   . Asthma   . Arthritis   . Chronic headaches   . Colon polyps   . Depression   . Diverticulosis   . Fibromyalgia   . Gallstones   . IBS (irritable bowel syndrome)   . Kidney stones   . Peripheral neuropathy    Past Surgical History:  Past Surgical History  Procedure Date  . Left ankle fracture     closed reduction March 2008  . Wrist arthroscopy     June 2003  . Carpal tunnel release 2003    bilateral  . Subcutaneous transposition of left ulnar nerve   . Abdominal hysterectomy   . Cholecystectomy   . Colon surgery   . Elbow surgery     bilateral   HPI:  Pt is a 61 year old female arriving for an outpatient MBS due to complaints of excessive coughing with PO intake, expecotration of POs, GERD. UGI in december 2012 showed small hiatal hernia.      Assessment / Plan / Recommendation Clinical Impression  Clinical impression: Pt demonstrated appearance of a primary esophageal dysphagia. Oral and oropharyngeal function within normal limits with no aspiration or penetration. Esophageal sweep showed appearance of slow emptying of thin liquids though GE juntction. After about 4 oz consumption of liquid barium. Pt began coughing and gagging, expectorating barium tinged secretions. Pt attempted to consume puree and could not due to hard coughing and gagging. Pt would benefit from further assessment by GI.     Treatment Recommendation       Diet Recommendation Regular;Thin liquid   Liquid Administration via:  Cup;Straw Medication Administration: Whole meds with liquid Supervision: Patient able to self feed Compensations: Slow rate;Small sips/bites;Follow solids with liquid Postural Changes and/or Swallow Maneuvers: Seated upright 90 degrees;Upright 30-60 min after meal    Other  Recommendations Recommended Consults: Consider GI evaluation   Follow Up Recommendations  None    Frequency and Duration        Pertinent Vitals/Pain NA    SLP Swallow Goals     General HPI: Pt is a 61 year old female arriving for an outpatient MBS due to complaints of excessive coughing with PO intake, expecotration of POs, GERD. UGI in december 2012 showed small hiatal hernia.  Type of Study: Modified Barium Swallowing Study Reason for Referral: Objectively evaluate swallowing function Diet Prior to this Study: Regular;Thin liquids Respiratory Status: Room air Behavior/Cognition: Alert;Cooperative;Pleasant mood Oral Cavity - Dentition: Adequate natural dentition Oral Motor / Sensory Function: Within functional limits Self-Feeding Abilities: Able to feed self Patient Positioning: Upright in chair Baseline Vocal Quality: Hoarse Volitional Cough: Strong Volitional Swallow: Able to elicit Anatomy: Within functional limits Pharyngeal Secretions: Not observed secondary MBS    Reason for Referral Objectively evaluate swallowing function   Oral Phase     Pharyngeal Phase Pharyngeal Phase: Impaired   Cervical Esophageal Phase Cervical Esophageal Phase: [facility]    Kalel Harty, Riley Nearing 02/08/2012, 1:01 PM

## 2012-02-15 ENCOUNTER — Other Ambulatory Visit: Payer: Self-pay | Admitting: Cardiovascular Disease

## 2012-02-15 MED ORDER — DOFETILIDE 250 MCG PO CAPS: 250.0000 ug | ORAL_CAPSULE | Freq: Two times a day (BID) | ORAL | Status: DC

## 2012-02-22 ENCOUNTER — Ambulatory Visit: Payer: Medicaid Other | Admitting: *Deleted

## 2012-02-23 ENCOUNTER — Ambulatory Visit: Payer: Medicaid Other | Attending: Otolaryngology | Admitting: *Deleted

## 2012-02-23 DIAGNOSIS — IMO0001 Reserved for inherently not codable concepts without codable children: Secondary | ICD-10-CM | POA: Insufficient documentation

## 2012-02-23 DIAGNOSIS — R490 Dysphonia: Secondary | ICD-10-CM | POA: Insufficient documentation

## 2012-02-24 ENCOUNTER — Encounter: Payer: Self-pay | Admitting: Gastroenterology

## 2012-02-24 ENCOUNTER — Ambulatory Visit (INDEPENDENT_AMBULATORY_CARE_PROVIDER_SITE_OTHER): Payer: Medicaid Other | Admitting: Gastroenterology

## 2012-02-24 VITALS — BP 144/76 | HR 80 | Ht 67.0 in | Wt 218.5 lb

## 2012-02-24 DIAGNOSIS — R933 Abnormal findings on diagnostic imaging of other parts of digestive tract: Secondary | ICD-10-CM

## 2012-02-24 DIAGNOSIS — R490 Dysphonia: Secondary | ICD-10-CM

## 2012-02-24 NOTE — Progress Notes (Signed)
Review of pertinent gastrointestinal problems:  1. chronic GERD. EGD February 2013 showed mild gastritis, H. pylori negative on biopsy. On proton pump inhibitor once daily and nightly Zantac  2. history of adenomatous polyps: Colonoscopy February 2013 found diverticulosis but no polyps. Recall colonoscopy at 5 year interval given 2008 adenomatous polyps  3. hoarseness, coughing, intermittent shortness of breath; not clear if this is related to her GERD but seems unlikely (5/13); was referred to ear nose and throat physician   HPI: This is a   Very pleasant 61 year old woman whom I last saw 1-2 months ago. I sent her to ear nose and throat physician who eventually sent her to speech therapy for modified barium swallow study.  Speech therapy felt she might have esophageal spasm.  Planning to work on Writer.  She is on protonix bid before meals and pecid is at bedtime.  This is maximum acid suppression.  MBSS: Clinical impression: Pt demonstrated appearance of a primary esophageal dysphagia. Oral and oropharyngeal function within normal limits with no aspiration or penetration. Esophageal sweep showed appearance of slow emptying of thin liquids though GE juntction. After about 4 oz consumption of liquid barium. Pt began coughing and gagging, expectorating barium tinged secretions. Pt attempted to consume puree and could not due to hard coughing and gagging. Pt would benefit from further assessment by GI.     Past Medical History  Diagnosis Date  . Diabetes mellitus     type 2  . Hypertension   . Hyperlipidemia   . Paroxysmal a-fib     coumadin ( Chads2=2) Echo 09/05/10 EF 55-60%; mod LVH  . LVH (left ventricular hypertrophy)   . Asthma   . Arthritis   . Chronic headaches   . Colon polyps   . Depression   . Diverticulosis   . Fibromyalgia   . Gallstones   . IBS (irritable bowel syndrome)   . Kidney stones   . Peripheral neuropathy     Past Surgical History  Procedure Date    . Left ankle fracture     closed reduction March 2008  . Wrist arthroscopy     June 2003  . Carpal tunnel release 2003    bilateral  . Subcutaneous transposition of left ulnar nerve   . Abdominal hysterectomy   . Cholecystectomy   . Colon surgery   . Elbow surgery     bilateral    Current Outpatient Prescriptions  Medication Sig Dispense Refill  . albuterol (PROVENTIL HFA;VENTOLIN HFA) 108 (90 BASE) MCG/ACT inhaler Inhale 2 puffs into the lungs every 4 (four) hours as needed for wheezing or shortness of breath.      Marland Kitchen aspirin 81 MG chewable tablet Chew 81 mg by mouth at bedtime.      . benzonatate (TESSALON) 100 MG capsule Take 100 mg by mouth 3 (three) times daily as needed.      . calcium carbonate (OS-CAL) 600 MG TABS Take 1 tablet (600 mg total) by mouth daily.      . Cranberry 500 MG CAPS Take 1 capsule (500 mg total) by mouth daily.      Marland Kitchen diltiazem (TIAZAC) 180 MG 24 hr capsule Take 180 mg by mouth at bedtime.      . dofetilide ([name]) 250 MCG capsule Take 1 capsule (250 mcg total) by mouth 2 (two) times daily.  60 capsule  1  . famotidine (PEPCID) 20 MG tablet Take 20 mg by mouth at bedtime.      Marland Kitchen  ferrous sulfate 325 (65 FE) MG tablet Take 1 tablet (325 mg total) by mouth daily.      . fluticasone (VERAMYST) 27.5 MCG/SPRAY nasal spray Place 2 sprays into the nose daily.      . hydroxypropyl methylcellulose (ISOPTO TEARS) 2.5 % ophthalmic solution Place 1 drop into both eyes 2 (two) times daily.       . insulin aspart (NOVOLOG) 100 UNIT/ML injection Sliding scale with each meal      . insulin glargine (LANTUS) 100 UNIT/ML injection Inject 40 Units into the skin at bedtime.      Marland Kitchen loratadine (CLARITIN) 10 MG tablet Take 10 mg by mouth every morning.      Marland Kitchen losartan-hydrochlorothiazide (HYZAAR) 100-12.5 MG per tablet Take 1 tablet by mouth every morning.      . metFORMIN (GLUCOPHAGE) 500 MG tablet Take 500 mg by mouth 2 (two) times daily with a meal.      . mirtazapine  (REMERON) 15 MG tablet Take 15 mg by mouth at bedtime.      . Naphazoline-Pheniramine (OPCON-A) 0.027-0.315 % SOLN Apply 1 drop to eye 2 (two) times daily.       . niacin (NIASPAN) 500 MG CR tablet Take 500 mg by mouth at bedtime.       . nitroGLYCERIN (NITROSTAT) 0.4 MG SL tablet Place 1 tablet (0.4 mg total) under the tongue every 5 (five) minutes as needed. For chest pain.  25 tablet  4  . pantoprazole (PROTONIX) 40 MG tablet Take 1 tablet (40 mg total) by mouth 2 (two) times daily before a meal.  60 tablet  11  . potassium chloride (K-DUR,KLOR-CON) 10 MEQ tablet Take 1 tablet (10 mEq total) by mouth daily.  30 tablet  11  . rizatriptan (MAXALT) 10 MG tablet Take 10 mg by mouth as needed. May repeat in 2 hours if needed      . rosuvastatin (CRESTOR) 10 MG tablet Take 10 mg by mouth at bedtime.       . traMADol (ULTRAM) 50 MG tablet Take 50 mg by mouth every 6 (six) hours as needed. For pain.      Marland Kitchen triamcinolone (NASACORT) 55 MCG/ACT nasal inhaler Place 2 sprays into the nose 2 (two) times daily.       . vitamin C (ASCORBIC ACID) 500 MG tablet Take 1 tablet (500 mg total) by mouth daily.        Allergies as of 02/24/2012 - Review Complete 02/24/2012  Allergen Reaction Noted  . Amitriptyline Other (See Comments) 08/25/2011  . Hydromorphone hcl Other (See Comments)     Family History  Problem Relation Age of Onset  . Heart attack Mother     @ age 42  . Heart attack Brother     @ age 52  . Colon cancer Maternal Aunt   . Mental illness Mother   . Prostate cancer Maternal Grandfather   . Ovarian cancer Maternal Aunt     and cousin  . Diabetes Mother   . Diabetes      siblings  . Hypertension Mother     siblings  . Lupus Sister     History   Social History  . Marital Status: Divorced    Spouse Name: N/A    Number of Children: 3  . Years of Education: N/A   Occupational History  . disabled     CNA   Social History Main Topics  . Smoking status: Never Smoker   .  Smokeless  tobacco: Never Used  . Alcohol Use: No  . Drug Use: No  . Sexually Active: Not on file   Other Topics Concern  . Not on file   Social History Narrative  . No narrative on file      Physical Exam: BP 144/76  Pulse 80  Ht 5\' 7"  (1.702 m)  Wt 218 lb 8 oz (99.111 kg)  BMI 34.22 kg/m2 Constitutional: generally well-appearing Psychiatric: alert and oriented x3 Abdomen: soft, nontender, nondistended, no obvious ascites, no peritoneal signs, normal bowel sounds     Assessment and plan: 61 y.o. female with  chronic hoarseness, possibly from GERD, abnormal modified barium swallow  Perhaps she does have esophageal spasm, perhaps underlying achalasia. Her EGD recently did not show any signs of this bout like to set her up with an esophageal manometry for further evaluation. In the meantime she will stay on maximum acid suppression with twice daily proton pump inhibitor and bedtime H2 blocker

## 2012-02-24 NOTE — Patient Instructions (Addendum)
Esophageal manometry for esophageal spasms, abnormal MBswallow test.  You have been scheduled for a Manometry at [facility] Endoscopy Department.  You will need to arrive at 830 am on 03/21/12 and have nothing to eat or drink after midnight.  See instruction information given to you today. Stay on protonix twice daily before meals and pepcid at bedtime.

## 2012-03-01 ENCOUNTER — Telehealth: Payer: Self-pay | Admitting: Cardiovascular Disease

## 2012-03-01 ENCOUNTER — Ambulatory Visit: Payer: Medicaid Other

## 2012-03-01 NOTE — Telephone Encounter (Signed)
New msg Pt was calling about tikosyn. She gets from drug company. Please call

## 2012-03-01 NOTE — Telephone Encounter (Signed)
TIKOYSYN ORDERED FROM [facility]  PT'S ID NUMBER  IS [id number] ORDER NUMBER  [id number] WILL ARRIVE IN 7-10 DAYS./CY

## 2012-03-03 NOTE — Telephone Encounter (Signed)
PT AWARE  AWAITING DELIVERY OF [name] WILL CALL ONCE  RECEIVED .Zack Seal

## 2012-03-04 NOTE — Telephone Encounter (Signed)
RECEIVED [name]  3 MONTH SUPPLY, PT AWARE  WILL LEAVE AT FRONT DESK FOR PICK UP .Zack Seal

## 2012-03-11 ENCOUNTER — Emergency Department (HOSPITAL_COMMUNITY)
Admission: EM | Admit: 2012-03-11 | Discharge: 2012-03-11 | Disposition: A | Discharge status: Home / Self Care | Payer: Medicaid Other | Attending: Emergency Medicine | Admitting: Emergency Medicine

## 2012-03-11 DIAGNOSIS — R1110 Vomiting, unspecified: Secondary | ICD-10-CM | POA: Insufficient documentation

## 2012-03-11 DIAGNOSIS — E785 Hyperlipidemia, unspecified: Secondary | ICD-10-CM | POA: Insufficient documentation

## 2012-03-11 DIAGNOSIS — E119 Type 2 diabetes mellitus without complications: Secondary | ICD-10-CM | POA: Insufficient documentation

## 2012-03-11 DIAGNOSIS — Z79899 Other long term (current) drug therapy: Secondary | ICD-10-CM | POA: Insufficient documentation

## 2012-03-11 DIAGNOSIS — K529 Noninfective gastroenteritis and colitis, unspecified: Secondary | ICD-10-CM

## 2012-03-11 DIAGNOSIS — R109 Unspecified abdominal pain: Secondary | ICD-10-CM | POA: Insufficient documentation

## 2012-03-11 DIAGNOSIS — I10 Essential (primary) hypertension: Secondary | ICD-10-CM | POA: Insufficient documentation

## 2012-03-11 DIAGNOSIS — Z7982 Long term (current) use of aspirin: Secondary | ICD-10-CM | POA: Insufficient documentation

## 2012-03-11 DIAGNOSIS — K5289 Other specified noninfective gastroenteritis and colitis: Secondary | ICD-10-CM | POA: Insufficient documentation

## 2012-03-11 LAB — CBC WITH DIFFERENTIAL/PLATELET
Basophils Relative: 0 % (ref 0–1)
Eosinophils Absolute: 0.1 10*3/uL (ref 0.0–0.7)
HCT: 40.4 % (ref 36.0–46.0)
Hemoglobin: 13.7 g/dL (ref 12.0–15.0)
Lymphs Abs: 2 10*3/uL (ref 0.7–4.0)
MCH: 29.1 pg (ref 26.0–34.0)
MCHC: 33.9 g/dL (ref 30.0–36.0)
Monocytes Absolute: 0.6 10*3/uL (ref 0.1–1.0)
Monocytes Relative: 7 % (ref 3–12)
Neutrophils Relative %: 65 % (ref 43–77)
RBC: 4.7 MIL/uL (ref 3.87–5.11)

## 2012-03-11 LAB — COMPREHENSIVE METABOLIC PANEL
Albumin: 4.4 g/dL (ref 3.5–5.2)
Alkaline Phosphatase: 81 U/L (ref 39–117)
BUN: 23 mg/dL (ref 6–23)
Chloride: 98 mEq/L (ref 96–112)
Creatinine, Ser: 1.68 mg/dL — ABNORMAL HIGH (ref 0.50–1.10)
GFR calc Af Amer: 37 mL/min — ABNORMAL LOW (ref >90)
Glucose, Bld: 178 mg/dL — ABNORMAL HIGH (ref 70–99)
Potassium: 3.5 mEq/L (ref 3.5–5.1)
Total Bilirubin: 0.4 mg/dL (ref 0.3–1.2)
Total Protein: 8 g/dL (ref 6.0–8.3)

## 2012-03-11 MED ORDER — ONDANSETRON HCL 4 MG/2ML IJ SOLN
INTRAMUSCULAR | Status: AC
  Administered 2012-03-11: 01:00:00
  Filled 2012-03-11: qty 2

## 2012-03-11 MED ORDER — ONDANSETRON 4 MG PO TBDP: 4.0000 mg | ORAL_TABLET | Freq: Three times a day (TID) | ORAL | Status: AC | PRN

## 2012-03-11 MED ORDER — ONDANSETRON HCL 4 MG/2ML IJ SOLN
4.0000 mg | Freq: Once | INTRAMUSCULAR | Status: AC
  Administered 2012-03-11: 4 mg via INTRAVENOUS
  Filled 2012-03-11: qty 2

## 2012-03-11 MED ORDER — SODIUM CHLORIDE 0.9 % IV BOLUS (SEPSIS)
1000.0000 mL | Freq: Once | INTRAVENOUS | Status: AC
  Administered 2012-03-11: 1000 mL via INTRAVENOUS

## 2012-03-11 MED ORDER — KETOROLAC TROMETHAMINE 30 MG/ML IJ SOLN
30.0000 mg | Freq: Once | INTRAMUSCULAR | Status: AC
  Administered 2012-03-11: 30 mg via INTRAVENOUS
  Filled 2012-03-11: qty 1

## 2012-03-11 MED ORDER — OXYCODONE-ACETAMINOPHEN 5-325 MG PO TABS: ORAL_TABLET | ORAL | Status: DC

## 2012-03-11 NOTE — ED Notes (Signed)
[id number]<AV> Expected date:03/11/12<BR> Expected time: 1:23 AM<BR> Means of arrival:Ambulance<BR> Comments:<BR> amb pain; n/v/d

## 2012-03-11 NOTE — ED Notes (Signed)
Pt began taking Azilsartan for BP several days ago and has been taking Victoza pen for diabetes for 3 weeks.

## 2012-03-11 NOTE — ED Provider Notes (Signed)
History     CSN: [id number]  Arrival date & time 03/11/12  0123   First MD Initiated Contact with Patient 03/11/12 0139      Chief Complaint  Patient presents with  . Diarrhea  . Emesis  . Abdominal Pain    (Consider location/radiation/quality/duration/timing/severity/associated sxs/prior treatment) Patient is a 61 y.o. female presenting with diarrhea, vomiting, and abdominal pain. The history is provided by the patient (pt complains of diarhea and nauseau). No language interpreter was used.  Diarrhea The primary symptoms include abdominal pain, vomiting and diarrhea. Primary symptoms do not include fatigue or rash. The illness began today. The onset was sudden. The problem has not changed since onset. The abdominal pain began today. The abdominal pain has been unchanged since its onset. The abdominal pain is generalized. The abdominal pain does not radiate. The severity of the abdominal pain is 4/10. The abdominal pain is relieved by nothing.  The diarrhea began today. The diarrhea is watery. The diarrhea occurs 5 to 10 times per day. Risk factors: nothing.  The illness does not include chills or back pain. Associated medical issues do not include inflammatory bowel disease or GERD. Risk factors: nothing.  Emesis  Associated symptoms include abdominal pain and diarrhea. Pertinent negatives include no chills, no cough and no headaches.  Abdominal Pain The primary symptoms of the illness include abdominal pain, vomiting and diarrhea. The primary symptoms of the illness do not include fatigue.  Symptoms associated with the illness do not include chills, hematuria, frequency or back pain. Significant associated medical issues do not include GERD or inflammatory bowel disease.    Past Medical History  Diagnosis Date  . Diabetes mellitus     type 2  . Hypertension   . Hyperlipidemia   . Paroxysmal a-fib     coumadin ( Chads2=2) Echo 09/05/10 EF 55-60%; mod LVH  . LVH (left ventricular  hypertrophy)   . Asthma   . Arthritis   . Chronic headaches   . Colon polyps   . Depression   . Diverticulosis   . Fibromyalgia   . Gallstones   . IBS (irritable bowel syndrome)   . Kidney stones   . Peripheral neuropathy     Past Surgical History  Procedure Date  . Left ankle fracture     closed reduction March 2008  . Wrist arthroscopy     June 2003  . Carpal tunnel release 2003    bilateral  . Subcutaneous transposition of left ulnar nerve   . Abdominal hysterectomy   . Cholecystectomy   . Colon surgery   . Elbow surgery     bilateral    Family History  Problem Relation Age of Onset  . Heart attack Mother     @ age 33  . Heart attack Brother     @ age 82  . Colon cancer Maternal Aunt   . Mental illness Mother   . Prostate cancer Maternal Grandfather   . Ovarian cancer Maternal Aunt     and cousin  . Diabetes Mother   . Diabetes      siblings  . Hypertension Mother     siblings  . Lupus Sister     History  Substance Use Topics  . Smoking status: Never Smoker   . Smokeless tobacco: Never Used  . Alcohol Use: No    OB History    Grav Para Term Preterm Abortions TAB SAB Ect Mult Living  Review of Systems  Constitutional: Negative for chills and fatigue.  HENT: Negative for congestion, sinus pressure and ear discharge.   Eyes: Negative for discharge.  Respiratory: Negative for cough.   Cardiovascular: Negative for chest pain.  Gastrointestinal: Positive for vomiting, abdominal pain and diarrhea.  Genitourinary: Negative for frequency and hematuria.  Musculoskeletal: Negative for back pain.  Skin: Negative for rash.  Neurological: Negative for seizures and headaches.  Hematological: Negative.   Psychiatric/Behavioral: Negative for hallucinations.    Allergies  Amitriptyline and Hydromorphone hcl  Home Medications   Current Outpatient Rx  Name Route Sig Dispense Refill  . ALBUTEROL SULFATE HFA 108 (90 BASE) MCG/ACT IN  AERS Inhalation Inhale 2 puffs into the lungs every 4 (four) hours as needed for wheezing or shortness of breath.    . ASPIRIN EC 81 MG PO TBEC Oral Take 81 mg by mouth daily.    . AZILSARTAN-CHLORTHALIDONE 40-12.5 MG PO TABS Oral Take 1 tablet by mouth daily.    Marland Kitchen CALCIUM CARBONATE 600 MG PO TABS Oral Take 1 tablet (600 mg total) by mouth daily.    Marland Kitchen VITAMIN D 1000 UNITS PO TABS Oral Take 2,000 Units by mouth daily.    Marland Kitchen CRANBERRY 500 MG PO CAPS Oral Take 1 capsule (500 mg total) by mouth daily.    Marland Kitchen DILTIAZEM HCL ER BEADS 180 MG PO CP24 Oral Take 180 mg by mouth at bedtime.    . DOFETILIDE 250 MCG PO CAPS Oral Take 1 capsule (250 mcg total) by mouth 2 (two) times daily. 60 capsule 1  . FAMOTIDINE 20 MG PO TABS Oral Take 20 mg by mouth at bedtime.    Marland Kitchen FERROUS SULFATE 325 (65 FE) MG PO TABS Oral Take 1 tablet (325 mg total) by mouth daily.    . INSULIN GLARGINE 100 UNIT/ML [place] SOLN Subcutaneous Inject 40 Units into the skin at bedtime.    Marland Kitchen LIRAGLUTIDE 18 MG/3ML [place] SOLN Subcutaneous Inject 1.8 mg into the skin daily.    Marland Kitchen LORATADINE 10 MG PO TABS Oral Take 10 mg by mouth every morning.    Marland Kitchen METFORMIN HCL 500 MG PO TABS Oral Take 500 mg by mouth 2 (two) times daily with a meal.    . MIRTAZAPINE 15 MG PO TABS Oral Take 15 mg by mouth at bedtime.    Marland Kitchen NIACIN ER (ANTIHYPERLIPIDEMIC) 500 MG PO TBCR Oral Take 500 mg by mouth at bedtime.     Marland Kitchen PANTOPRAZOLE SODIUM 40 MG PO TBEC Oral Take 1 tablet (40 mg total) by mouth 2 (two) times daily before a meal. 60 tablet 11  . POTASSIUM CHLORIDE CRYS ER 10 MEQ PO TBCR Oral Take 1 tablet (10 mEq total) by mouth daily. 30 tablet 11  . RIZATRIPTAN BENZOATE 10 MG PO TABS Oral Take 10 mg by mouth as needed. For migraines. May repeat in 2 hours if needed.    Marland Kitchen ROSUVASTATIN CALCIUM 10 MG PO TABS Oral Take 10 mg by mouth at bedtime.     . TRAMADOL HCL 50 MG PO TABS Oral Take 50 mg by mouth every 6 (six) hours as needed. For pain.    . TRIAMCINOLONE ACETONIDE 55 MCG/ACT  NA INHA Nasal Place 2 sprays into the nose 2 (two) times daily.     Marland Kitchen VITAMIN C 500 MG PO TABS Oral Take 1 tablet (500 mg total) by mouth daily.    Marland Kitchen NITROGLYCERIN 0.4 MG SL SUBL Sublingual Place 1 tablet (0.4 mg total) under the  tongue every 5 (five) minutes as needed. For chest pain. 25 tablet 4  . ONDANSETRON 4 MG PO TBDP Oral Take 1 tablet (4 mg total) by mouth every 8 (eight) hours as needed for nausea. 20 tablet 0  . OXYCODONE-ACETAMINOPHEN 5-325 MG PO TABS  Take one tablet every 6 hours for pain 15 tablet 0    BP 110/67  Pulse 115  Temp 97.4 F (36.3 C) (Oral)  Resp 16  SpO2 100%  Physical Exam  Constitutional: She is oriented to person, place, and time. She appears well-developed.  HENT:  Head: Normocephalic and atraumatic.  Eyes: Conjunctivae and EOM are normal. No scleral icterus.  Neck: Neck supple. No thyromegaly present.  Cardiovascular: Normal rate and regular rhythm.  Exam reveals no gallop and no friction rub.   No murmur heard. Pulmonary/Chest: No stridor. She has no wheezes. She has no rales. She exhibits no tenderness.  Abdominal: She exhibits no distension. There is tenderness. There is no rebound.  Musculoskeletal: Normal range of motion. She exhibits no edema.  Lymphadenopathy:    She has no cervical adenopathy.  Neurological: She is oriented to person, place, and time. Coordination normal.  Skin: No rash noted. No erythema.  Psychiatric: She has a normal mood and affect. Her behavior is normal.    ED Course  Procedures (including critical care time)  Labs Reviewed  COMPREHENSIVE METABOLIC PANEL - Abnormal; Notable for the following:    Glucose, Bld 178 (*)     Creatinine, Ser 1.68 (*)     Calcium 10.6 (*)     GFR calc non Af Amer 32 (*)     GFR calc Af Amer 37 (*)     All other components within normal limits  CBC WITH DIFFERENTIAL   No results found.   1. Gastroenteritis       MDM          Benny Lennert, MD 03/11/12 [phone]

## 2012-03-11 NOTE — ED Notes (Signed)
Patient is alert and oriented x3.  She was given DC instructions and follow up visit instructions.  Patient gave verbal understanding. She was DC ambulatory under his own power to home.  V/S stable.  He was not showing any signs of distress on DC 

## 2012-03-11 NOTE — ED Notes (Addendum)
Pt states that she had her insulin changed several days ago but has also had abdominal pain, emesis, and diarhea for several days. Pt was lethargic, cool, and diaphoretic upon arrival by EMS. CBG 189 per EMS. Left posterior hand 20g IV. 4mg  Zofran en route.

## 2012-03-16 ENCOUNTER — Ambulatory Visit: Payer: Medicaid Other

## 2012-03-21 ENCOUNTER — Encounter (HOSPITAL_COMMUNITY)
Admission: RE | Disposition: A | Discharge status: Home / Self Care | Payer: Self-pay | Source: Ambulatory Visit | Attending: Gastroenterology

## 2012-03-21 ENCOUNTER — Ambulatory Visit (HOSPITAL_COMMUNITY)
Admission: RE | Admit: 2012-03-21 | Discharge: 2012-03-21 | Disposition: A | Discharge status: Home / Self Care | Payer: Medicaid Other | Source: Ambulatory Visit | Attending: Gastroenterology | Admitting: Gastroenterology

## 2012-03-21 DIAGNOSIS — R112 Nausea with vomiting, unspecified: Secondary | ICD-10-CM | POA: Insufficient documentation

## 2012-03-21 DIAGNOSIS — R0789 Other chest pain: Secondary | ICD-10-CM | POA: Insufficient documentation

## 2012-03-21 DIAGNOSIS — R12 Heartburn: Secondary | ICD-10-CM | POA: Insufficient documentation

## 2012-03-21 HISTORY — PX: ESOPHAGEAL MANOMETRY: SHX5429

## 2012-03-21 SURGERY — MANOMETRY, ESOPHAGUS
Anesthesia: LOCAL

## 2012-03-21 MED ORDER — LIDOCAINE VISCOUS 2 % MT SOLN
OROMUCOSAL | Status: AC
  Filled 2012-03-21: qty 15

## 2012-03-22 ENCOUNTER — Encounter (HOSPITAL_COMMUNITY): Payer: Self-pay | Admitting: Gastroenterology

## 2012-03-23 ENCOUNTER — Telehealth: Payer: Self-pay | Admitting: Gastroenterology

## 2012-03-23 NOTE — Telephone Encounter (Signed)
Pt was notified that the results are yet available for her manometry and I will call her with the results when available

## 2012-03-23 NOTE — Telephone Encounter (Signed)
Manometry test results on Dr Christella Hartigan desk for review Left message on machine to call back

## 2012-03-24 ENCOUNTER — Telehealth: Payer: Self-pay | Admitting: Gastroenterology

## 2012-03-24 NOTE — Telephone Encounter (Signed)
[name],  Please call her.  I reviewed the esophageal manometry from 03/21/2012 and it was completely normal.  No evidence of esophageal source of her symptoms.  She should continue bid ppi and nightly h2 blocker and rov with me next available.

## 2012-03-25 NOTE — Telephone Encounter (Signed)
Pt aware and ROV scheduled 

## 2012-03-29 ENCOUNTER — Ambulatory Visit: Payer: Medicaid Other | Attending: Otolaryngology

## 2012-03-29 DIAGNOSIS — IMO0001 Reserved for inherently not codable concepts without codable children: Secondary | ICD-10-CM | POA: Insufficient documentation

## 2012-03-29 DIAGNOSIS — R490 Dysphonia: Secondary | ICD-10-CM | POA: Insufficient documentation

## 2012-04-15 ENCOUNTER — Encounter: Payer: Self-pay | Admitting: Gastroenterology

## 2012-04-15 ENCOUNTER — Ambulatory Visit (INDEPENDENT_AMBULATORY_CARE_PROVIDER_SITE_OTHER): Payer: Medicaid Other | Admitting: Gastroenterology

## 2012-04-15 VITALS — BP 120/60 | HR 80 | Ht 66.25 in | Wt 208.1 lb

## 2012-04-15 DIAGNOSIS — R1031 Right lower quadrant pain: Secondary | ICD-10-CM

## 2012-04-15 NOTE — Progress Notes (Signed)
Review of pertinent gastrointestinal problems:  1. chronic GERD. EGD February 2013 showed mild gastritis, H. pylori negative on biopsy. On proton pump inhibitor once daily and nightly Zantac  2. history of adenomatous polyps: Colonoscopy February 2013 found diverticulosis but no polyps. Recall colonoscopy at 5 year interval given 2008 adenomatous polyps  3. hoarseness, coughing, intermittent shortness of breath; not clear if this is related to her GERD but seems unlikely (5/13); was referred to ear nose and throat physician who ordered MBSS 2013: Clinical impression: Pt demonstrated appearance of a primary esophageal dysphagia. Oral and oropharyngeal function within normal limits with no aspiration or penetration. Esophageal sweep showed appearance of slow emptying of thin liquids though GE juntction. After about 4 oz consumption of liquid barium. Pt began coughing and gagging, expectorating barium tinged secretions. Pt attempted to consume puree and could not due to hard coughing and gagging. Pt would benefit from further assessment by GI.  02/2012 Esophageal manometry was completely normal.   HPI: This is a   pleasant 60 year old woman whom I last saw several months ago. Since then she had an esophageal manometry performed and it was normal.  Something is going on.    She was having vomiting, diarrheal illness, pain in RLQ;  This was an acute illness and she went to ER (cbc, cmet normal except for slightly elevated Cr).  She still has nausea. She has gagging.  She has been having a RLQ pain.   She had same pain a year ago 2012 and CT scan was done and it was unrevealing.  She was put on percocet for the pains in her RLQ.  She has pain in right hand, has numbness in right thigh if she is standing for a long period of time.      Past Medical History  Diagnosis Date  . Diabetes mellitus     type 2  . Hypertension   . Hyperlipidemia   . Paroxysmal a-fib     coumadin ( Chads2=2) Echo 09/05/10  EF 55-60%; mod LVH  . LVH (left ventricular hypertrophy)   . Asthma   . Arthritis   . Chronic headaches   . Colon polyps   . Depression   . Diverticulosis   . Fibromyalgia   . Gallstones   . IBS (irritable bowel syndrome)   . Kidney stones   . Peripheral neuropathy     Past Surgical History  Procedure Date  . Left ankle fracture     closed reduction March 2008  . Wrist arthroscopy     June 2003  . Carpal tunnel release 2003    bilateral  . Subcutaneous transposition of left ulnar nerve   . Abdominal hysterectomy   . Cholecystectomy   . Colon surgery   . Elbow surgery     bilateral  . Esophageal manometry 03/21/2012    Procedure: ESOPHAGEAL MANOMETRY (EM);  Surgeon: [name], MD;  Location: [facility];  Service: Endoscopy;  Laterality: N/A;    Current Outpatient Prescriptions  Medication Sig Dispense Refill  . albuterol (PROVENTIL HFA;VENTOLIN HFA) 108 (90 BASE) MCG/ACT inhaler Inhale 2 puffs into the lungs every 4 (four) hours as needed for wheezing or shortness of breath.      Marland Kitchen aspirin EC 81 MG tablet Take 81 mg by mouth daily.      . Azilsartan-Chlorthalidone 40-12.5 MG TABS Take 1 tablet by mouth daily.      . calcium carbonate (OS-CAL) 600 MG TABS Take 1 tablet (600 mg total)  by mouth daily.      . cholecalciferol (VITAMIN D) 1000 UNITS tablet Take 2,000 Units by mouth daily.      . Cranberry 500 MG CAPS Take 1 capsule (500 mg total) by mouth daily.      Marland Kitchen diltiazem (TIAZAC) 180 MG 24 hr capsule Take 180 mg by mouth at bedtime.      . dofetilide ([name]) 250 MCG capsule Take 1 capsule (250 mcg total) by mouth 2 (two) times daily.  60 capsule  1  . esomeprazole (NEXIUM) 40 MG capsule Take 40 mg by mouth 2 (two) times daily.      . famotidine (PEPCID) 20 MG tablet Take 20 mg by mouth at bedtime.      . ferrous sulfate 325 (65 FE) MG tablet Take 1 tablet (325 mg total) by mouth daily.      . insulin glargine (LANTUS) 100 UNIT/ML injection Inject 40 Units into  the skin at bedtime.      . Liraglutide (VICTOZA) 18 MG/3ML SOLN Inject 1.8 mg into the skin daily.      Marland Kitchen loratadine (CLARITIN) 10 MG tablet Take 10 mg by mouth every morning.      . metFORMIN (GLUCOPHAGE) 500 MG tablet Take 500 mg by mouth 2 (two) times daily with a meal.      . mirtazapine (REMERON) 15 MG tablet Take 15 mg by mouth at bedtime.      . niacin (NIASPAN) 500 MG CR tablet Take 500 mg by mouth at bedtime.       . nitroGLYCERIN (NITROSTAT) 0.4 MG SL tablet Place 1 tablet (0.4 mg total) under the tongue every 5 (five) minutes as needed. For chest pain.  25 tablet  4  . potassium chloride (K-DUR,KLOR-CON) 10 MEQ tablet Take 1 tablet (10 mEq total) by mouth daily.  30 tablet  11  . rizatriptan (MAXALT) 10 MG tablet Take 10 mg by mouth as needed. For migraines. May repeat in 2 hours if needed.      . rosuvastatin (CRESTOR) 10 MG tablet Take 10 mg by mouth at bedtime.       . traMADol (ULTRAM) 50 MG tablet Take 50 mg by mouth every 6 (six) hours as needed. For pain.      Marland Kitchen triamcinolone (NASACORT) 55 MCG/ACT nasal inhaler Place 2 sprays into the nose 2 (two) times daily.       . vitamin C (ASCORBIC ACID) 500 MG tablet Take 1 tablet (500 mg total) by mouth daily.        Allergies as of 04/15/2012 - Review Complete 04/15/2012  Allergen Reaction Noted  . Amitriptyline Other (See Comments) 08/25/2011  . Hydromorphone hcl Other (See Comments)     Family History  Problem Relation Age of Onset  . Heart attack Mother     @ age 53  . Heart attack Brother     @ age 17  . Colon cancer Maternal Aunt   . Mental illness Mother   . Prostate cancer Maternal Grandfather   . Ovarian cancer Maternal Aunt     and cousin  . Diabetes Mother   . Diabetes      siblings  . Hypertension Mother     siblings  . Lupus Sister     History   Social History  . Marital Status: Divorced    Spouse Name: N/A    Number of Children: 3  . Years of Education: N/A   Occupational History  . disabled  CNA   Social History Main Topics  . Smoking status: Never Smoker   . Smokeless tobacco: Never Used  . Alcohol Use: No  . Drug Use: No  . Sexually Active: Not on file   Other Topics Concern  . Not on file   Social History Narrative  . No narrative on file      Physical Exam: BP 120/60  Pulse 80  Ht 5' 6.25" (1.683 m)  Wt 208 lb 2 oz (94.405 kg)  BMI 33.34 kg/m2 Constitutional: generally well-appearing Psychiatric: alert and oriented x3 Abdomen: soft, nontender, nondistended, no obvious ascites, no peritoneal signs, normal bowel sounds     Assessment and plan: 61 y.o. female with myriad upper and lower GI symptoms, unclear etiology, I suspect functional  She is most concerned about right thigh numbness and I cannot explain that for her. I tried to reassure her that since they look in her colon and in her stomach and she has had at least 3 CT scans of her abdomen and pelvis in the past year or 2 that there is very unlikely anything serious going on. She will return to see me on an as-needed basis.

## 2012-04-15 NOTE — Patient Instructions (Addendum)
Not clear that your right lower abdomen pains that correlate with your right thigh pains are related to your GI tract. Would continue workup with PCP, perhaps spinal films.

## 2012-04-21 ENCOUNTER — Ambulatory Visit (INDEPENDENT_AMBULATORY_CARE_PROVIDER_SITE_OTHER): Payer: Medicaid Other | Admitting: Cardiovascular Disease

## 2012-04-21 ENCOUNTER — Encounter: Payer: Self-pay | Admitting: Cardiovascular Disease

## 2012-04-21 VITALS — BP 110/64 | HR 80 | Ht 67.0 in | Wt 206.1 lb

## 2012-04-21 DIAGNOSIS — I10 Essential (primary) hypertension: Secondary | ICD-10-CM

## 2012-04-21 DIAGNOSIS — E119 Type 2 diabetes mellitus without complications: Secondary | ICD-10-CM

## 2012-04-21 DIAGNOSIS — I4891 Unspecified atrial fibrillation: Secondary | ICD-10-CM

## 2012-04-21 DIAGNOSIS — E785 Hyperlipidemia, unspecified: Secondary | ICD-10-CM

## 2012-04-21 NOTE — Assessment & Plan Note (Signed)
Maint NSR continue low dose cardizem

## 2012-04-21 NOTE — Progress Notes (Signed)
Patient ID: [name], female   DOB: June 13, 1951, 61 y.o.   MRN: [id number] She has a history diabetes hypertension, anemia and asthma as well as atrial fibrillation. He atrial fibrillation was first diagnosed 08/2009. She had another admission to the hospital and was placed on [name]. She has maintained sinus rhythm. Dr. [name] took her off of Coumadin. Cardiac catheterization 5/12 demonstrated normal coronary arteries. Last echo: EF 60-65%, mild LVH, mild LAE. She was recently by pulmonary. She was noted to have flare of upper airway cough syndrome treated with antibiotics. She had a prednisone taper for persistent cough in 09/2011. CXR in 08/2011 without acute changes. She has a ? Dx of sarcoid. Prominent mediastinal nodes but "not active" She had a recent workup with gastroenterology demonstrating gastritis. She was felt to have a mild flare of this as well and she was referred back to gastroenterology. Dyspnea from obesity and deconditioning. BNP normal and EF normal. No evidence of recurrent afib  Dr Parke Simmers controlling sugar better.  On victosa.  On ACE to protect kidneys.  Sees eye doctor and no retinopathy   ROS: Denies fever, malais, weight loss, blurry vision, decreased visual acuity, cough, sputum, SOB, hemoptysis, pleuritic pain, palpitaitons, heartburn, abdominal pain, melena, lower extremity edema, claudication, or rash.  All other systems reviewed and negative  General: Affect appropriate Healthy:  appears stated age HEENT: normal Neck supple with no adenopathy JVP normal no bruits no thyromegaly Lungs clear with no wheezing and good diaphragmatic motion Heart:  S1/S2 no murmur, no rub, gallop or click PMI normal Abdomen: benighn, BS positve, no tenderness, no AAA no bruit.  No HSM or HJR Distal pulses intact with no bruits No edema Neuro non-focal Skin warm and dry No muscular weakness   Current Outpatient Prescriptions  Medication Sig Dispense Refill  . albuterol  (PROVENTIL HFA;VENTOLIN HFA) 108 (90 BASE) MCG/ACT inhaler Inhale 2 puffs into the lungs every 4 (four) hours as needed for wheezing or shortness of breath.      Marland Kitchen aspirin EC 81 MG tablet Take 81 mg by mouth daily.      . Azilsartan-Chlorthalidone 40-12.5 MG TABS Take 1 tablet by mouth daily.      . calcium carbonate (OS-CAL) 600 MG TABS Take 1 tablet (600 mg total) by mouth daily.      . cholecalciferol (VITAMIN D) 1000 UNITS tablet Take 2,000 Units by mouth daily.      . Cranberry 500 MG CAPS Take 1 capsule (500 mg total) by mouth daily.      Marland Kitchen diltiazem (TIAZAC) 180 MG 24 hr capsule Take 180 mg by mouth at bedtime.      . dofetilide ([name]) 250 MCG capsule Take 1 capsule (250 mcg total) by mouth 2 (two) times daily.  60 capsule  1  . esomeprazole (NEXIUM) 40 MG capsule Take 40 mg by mouth 2 (two) times daily.      . famotidine (PEPCID) 20 MG tablet Take 20 mg by mouth at bedtime.      . ferrous sulfate 325 (65 FE) MG tablet Take 1 tablet (325 mg total) by mouth daily.      . insulin glargine (LANTUS) 100 UNIT/ML injection Inject 40 Units into the skin at bedtime.      . Liraglutide (VICTOZA) 18 MG/3ML SOLN Inject 1.8 mg into the skin daily.      Marland Kitchen loratadine (CLARITIN) 10 MG tablet Take 10 mg by mouth every morning.      . metFORMIN (GLUCOPHAGE)  500 MG tablet Take 500 mg by mouth 2 (two) times daily with a meal.      . mirtazapine (REMERON) 15 MG tablet Take 15 mg by mouth at bedtime.      . niacin (NIASPAN) 500 MG CR tablet Take 500 mg by mouth at bedtime.       . nitroGLYCERIN (NITROSTAT) 0.4 MG SL tablet Place 1 tablet (0.4 mg total) under the tongue every 5 (five) minutes as needed. For chest pain.  25 tablet  4  . potassium chloride (K-DUR,KLOR-CON) 10 MEQ tablet Take 1 tablet (10 mEq total) by mouth daily.  30 tablet  11  . rizatriptan (MAXALT) 10 MG tablet Take 10 mg by mouth as needed. For migraines. May repeat in 2 hours if needed.      . rosuvastatin (CRESTOR) 10 MG tablet Take 10  mg by mouth at bedtime.       . traMADol (ULTRAM) 50 MG tablet Take 50 mg by mouth every 6 (six) hours as needed. For pain.      Marland Kitchen triamcinolone (NASACORT) 55 MCG/ACT nasal inhaler Place 2 sprays into the nose 2 (two) times daily.       . vitamin C (ASCORBIC ACID) 500 MG tablet Take 1 tablet (500 mg total) by mouth daily.        Allergies  Amitriptyline and Hydromorphone hcl  Electrocardiogram:  Assessment and Plan

## 2012-04-21 NOTE — Assessment & Plan Note (Signed)
Discussed low carb diet.  Target hemoglobin A1c is 6.5 or less.  Continue current medications. F/U Dr Parke Simmers

## 2012-04-21 NOTE — Assessment & Plan Note (Signed)
Well controlled.  Continue current medications and low sodium Dash type diet.    

## 2012-04-21 NOTE — Patient Instructions (Signed)
Your physician wants you to follow-up in: YEAR WITH DR NISHAN  You will receive a reminder letter in the mail two months in advance. If you don't receive a letter, please call our office to schedule the follow-up appointment.  Your physician recommends that you continue on your current medications as directed. Please refer to the Current Medication list given to you today. 

## 2012-04-21 NOTE — Assessment & Plan Note (Signed)
Cholesterol is at goal.  Continue current dose of statin and diet Rx.  No myalgias or side effects.  F/U  LFT's in 6 months. Lab Results  Component Value Date   LDLCALC  Value: UNABLE TO CALCULATE IF TRIGLYCERIDE OVER 400 mg/dL        Total Cholesterol/HDL:CHD Risk Coronary Heart Disease Risk Table                     Men   Women  1/2 Average Risk   3.4   3.3  Average Risk       5.0   4.4  2 X Average Risk   9.6   7.1  3 X Average Risk  23.4   11.0        Use the calculated Patient Ratio above and the CHD Risk Table to determine the patient's CHD Risk.        ATP III CLASSIFICATION (LDL):  <100     mg/dL   Optimal  100-129  mg/dL   Near or Above                    Optimal  130-159  mg/dL   Borderline  160-189  mg/dL   High  >190     mg/dL   Very High 09/08/2010             

## 2012-04-26 ENCOUNTER — Other Ambulatory Visit: Payer: Self-pay | Admitting: Orthopedic Surgery

## 2012-04-26 DIAGNOSIS — M542 Cervicalgia: Secondary | ICD-10-CM

## 2012-04-30 ENCOUNTER — Ambulatory Visit
Admission: RE | Admit: 2012-04-30 | Discharge: 2012-04-30 | Disposition: A | Discharge status: Home / Self Care | Payer: Medicaid Other | Source: Ambulatory Visit | Attending: Orthopedic Surgery | Admitting: Orthopedic Surgery

## 2012-04-30 DIAGNOSIS — M542 Cervicalgia: Secondary | ICD-10-CM

## 2012-05-09 ENCOUNTER — Other Ambulatory Visit (HOSPITAL_COMMUNITY): Payer: Self-pay | Admitting: Family Medicine

## 2012-05-09 DIAGNOSIS — Z1231 Encounter for screening mammogram for malignant neoplasm of breast: Secondary | ICD-10-CM

## 2012-06-02 ENCOUNTER — Other Ambulatory Visit: Payer: Self-pay | Admitting: Cardiovascular Disease

## 2012-06-06 ENCOUNTER — Ambulatory Visit (HOSPITAL_COMMUNITY)
Admission: RE | Admit: 2012-06-06 | Discharge: 2012-06-06 | Disposition: A | Discharge status: Home / Self Care | Payer: Medicaid Other | Source: Ambulatory Visit | Attending: Family Medicine | Admitting: Family Medicine

## 2012-06-06 DIAGNOSIS — Z1231 Encounter for screening mammogram for malignant neoplasm of breast: Secondary | ICD-10-CM | POA: Insufficient documentation

## 2012-06-09 ENCOUNTER — Other Ambulatory Visit: Payer: Self-pay | Admitting: Family Medicine

## 2012-06-09 ENCOUNTER — Telehealth: Payer: Self-pay | Admitting: Cardiovascular Disease

## 2012-06-09 DIAGNOSIS — R928 Other abnormal and inconclusive findings on diagnostic imaging of breast: Secondary | ICD-10-CM

## 2012-06-09 NOTE — Telephone Encounter (Signed)
[name] 250 MCG   ORDERED FOR PT THRU CARE CONNECTION ORDER NUM [id number]  ALLOW  7-10 DAYS  ./CY ALSO COMPLAINING OF ELEVATED HEART RATE RANGING  97-99  HAD  STEROID INJECTIONS FOR BACK LAST INJ WAS LAST WEEK  INSTRUCTED PT TO CONT TO MONITOR  ALLOWING MORE TIME FOR INJ TO WEAR OFF  CALL IF NO  IMPROVEMENT  WITH  HEART RATE .Zack Seal

## 2012-06-09 NOTE — Telephone Encounter (Signed)
plz return call to pt [phone] regarding [name] Med

## 2012-06-15 ENCOUNTER — Ambulatory Visit
Admission: RE | Admit: 2012-06-15 | Discharge: 2012-06-15 | Disposition: A | Discharge status: Home / Self Care | Payer: Medicaid Other | Source: Ambulatory Visit | Attending: Family Medicine | Admitting: Family Medicine

## 2012-06-15 DIAGNOSIS — R928 Other abnormal and inconclusive findings on diagnostic imaging of breast: Secondary | ICD-10-CM

## 2012-06-16 ENCOUNTER — Emergency Department (HOSPITAL_COMMUNITY): Payer: Medicaid Other

## 2012-06-16 ENCOUNTER — Emergency Department (HOSPITAL_COMMUNITY)
Admission: EM | Admit: 2012-06-16 | Discharge: 2012-06-16 | Disposition: A | Discharge status: Home Health | Payer: Medicaid Other | Attending: Emergency Medicine | Admitting: Emergency Medicine

## 2012-06-16 ENCOUNTER — Telehealth: Payer: Self-pay | Admitting: Cardiovascular Disease

## 2012-06-16 ENCOUNTER — Encounter (HOSPITAL_COMMUNITY): Payer: Self-pay | Admitting: *Deleted

## 2012-06-16 DIAGNOSIS — E785 Hyperlipidemia, unspecified: Secondary | ICD-10-CM | POA: Insufficient documentation

## 2012-06-16 DIAGNOSIS — Z79899 Other long term (current) drug therapy: Secondary | ICD-10-CM | POA: Insufficient documentation

## 2012-06-16 DIAGNOSIS — I4891 Unspecified atrial fibrillation: Secondary | ICD-10-CM | POA: Insufficient documentation

## 2012-06-16 DIAGNOSIS — F329 Major depressive disorder, single episode, unspecified: Secondary | ICD-10-CM | POA: Insufficient documentation

## 2012-06-16 DIAGNOSIS — F411 Generalized anxiety disorder: Secondary | ICD-10-CM | POA: Insufficient documentation

## 2012-06-16 DIAGNOSIS — Z7982 Long term (current) use of aspirin: Secondary | ICD-10-CM | POA: Insufficient documentation

## 2012-06-16 DIAGNOSIS — R51 Headache: Secondary | ICD-10-CM | POA: Insufficient documentation

## 2012-06-16 DIAGNOSIS — IMO0001 Reserved for inherently not codable concepts without codable children: Secondary | ICD-10-CM | POA: Insufficient documentation

## 2012-06-16 DIAGNOSIS — N200 Calculus of kidney: Secondary | ICD-10-CM | POA: Insufficient documentation

## 2012-06-16 DIAGNOSIS — M129 Arthropathy, unspecified: Secondary | ICD-10-CM | POA: Insufficient documentation

## 2012-06-16 DIAGNOSIS — Z8719 Personal history of other diseases of the digestive system: Secondary | ICD-10-CM | POA: Insufficient documentation

## 2012-06-16 DIAGNOSIS — Z794 Long term (current) use of insulin: Secondary | ICD-10-CM | POA: Insufficient documentation

## 2012-06-16 DIAGNOSIS — R079 Chest pain, unspecified: Secondary | ICD-10-CM

## 2012-06-16 DIAGNOSIS — K5730 Diverticulosis of large intestine without perforation or abscess without bleeding: Secondary | ICD-10-CM | POA: Insufficient documentation

## 2012-06-16 DIAGNOSIS — Z7901 Long term (current) use of anticoagulants: Secondary | ICD-10-CM | POA: Insufficient documentation

## 2012-06-16 DIAGNOSIS — F3289 Other specified depressive episodes: Secondary | ICD-10-CM | POA: Insufficient documentation

## 2012-06-16 DIAGNOSIS — I10 Essential (primary) hypertension: Secondary | ICD-10-CM | POA: Insufficient documentation

## 2012-06-16 DIAGNOSIS — J45909 Unspecified asthma, uncomplicated: Secondary | ICD-10-CM | POA: Insufficient documentation

## 2012-06-16 DIAGNOSIS — R072 Precordial pain: Secondary | ICD-10-CM

## 2012-06-16 DIAGNOSIS — E119 Type 2 diabetes mellitus without complications: Secondary | ICD-10-CM | POA: Insufficient documentation

## 2012-06-16 DIAGNOSIS — K8020 Calculus of gallbladder without cholecystitis without obstruction: Secondary | ICD-10-CM | POA: Insufficient documentation

## 2012-06-16 DIAGNOSIS — G608 Other hereditary and idiopathic neuropathies: Secondary | ICD-10-CM | POA: Insufficient documentation

## 2012-06-16 LAB — COMPREHENSIVE METABOLIC PANEL
Albumin: 3.8 g/dL (ref 3.5–5.2)
BUN: 11 mg/dL (ref 6–23)
Creatinine, Ser: 1.19 mg/dL — ABNORMAL HIGH (ref 0.50–1.10)
GFR calc Af Amer: 56 mL/min — ABNORMAL LOW (ref >90)
Glucose, Bld: 143 mg/dL — ABNORMAL HIGH (ref 70–99)
Total Bilirubin: 0.3 mg/dL (ref 0.3–1.2)
Total Protein: 7.5 g/dL (ref 6.0–8.3)

## 2012-06-16 LAB — CBC WITH DIFFERENTIAL/PLATELET
Basophils Relative: 1 % (ref 0–1)
Eosinophils Absolute: 0.1 10*3/uL (ref 0.0–0.7)
HCT: 33 % — ABNORMAL LOW (ref 36.0–46.0)
Hemoglobin: 11 g/dL — ABNORMAL LOW (ref 12.0–15.0)
MCH: 29.3 pg (ref 26.0–34.0)
MCHC: 33.3 g/dL (ref 30.0–36.0)
MCV: 87.8 fL (ref 78.0–100.0)
Monocytes Absolute: 0.4 10*3/uL (ref 0.1–1.0)
Monocytes Relative: 12 % (ref 3–12)

## 2012-06-16 LAB — POCT I-STAT TROPONIN I: Troponin i, poc: 0.01 ng/mL (ref 0.00–0.08)

## 2012-06-16 LAB — MAGNESIUM: Magnesium: 1.8 mg/dL (ref 1.5–2.5)

## 2012-06-16 MED ORDER — MORPHINE SULFATE 4 MG/ML IJ SOLN
4.0000 mg | Freq: Once | INTRAMUSCULAR | Status: AC
  Administered 2012-06-16: 4 mg via INTRAVENOUS
  Filled 2012-06-16: qty 1

## 2012-06-16 MED ORDER — ONDANSETRON HCL 4 MG/2ML IJ SOLN
4.0000 mg | Freq: Once | INTRAMUSCULAR | Status: AC
  Administered 2012-06-16: 4 mg via INTRAVENOUS
  Filled 2012-06-16: qty 2

## 2012-06-16 MED ORDER — ASPIRIN 81 MG PO CHEW
324.0000 mg | CHEWABLE_TABLET | Freq: Once | ORAL | Status: AC
  Administered 2012-06-16: 324 mg via ORAL
  Filled 2012-06-16: qty 4

## 2012-06-16 NOTE — Telephone Encounter (Signed)
Left message to call back  

## 2012-06-16 NOTE — Telephone Encounter (Signed)
New Problem:    Patient called in because her HR was still creeping up and was 101 this morning.  Please call back.

## 2012-06-16 NOTE — Consult Note (Signed)
History and Physical   Patient ID: [name] MRN: [id number], DOB/AGE: 03/04/1951   Admit date: 06/16/2012 Date of Consult: 06/16/2012   Primary Physician: Geraldo Pitter, MD Primary Cardiologist: Charlton Haws, MDv  HPI: [name] is a 61yo female with PMHx s/f PAF (on [name]), DM, HTN, HL, hypothyroidism, GERD, peripheral neuropathy and fibromyalgia who p/w chest pain.   12/2010 cath: no angiographic evidence of CAD, LVEF 65%.  2009 echo: LVEF 60-65%, unspecified diastolic dysfxn, no WMAs or valv abnlties.   She reports experiencing sharp, intermittent left-sided chest pain radiating to her L arm this AM while at rest. No prior episodes of cp, no association with meals or exertion. She reports associated SOB, nausea, lightheadedness, diaphoresis, PND, orthopnea, LEE, palpitations and blurry vision. She denies any trauma or over-exertion to the area. She does have burning/aching pain with peripheral neuropathy, and pain associated with cervical DJD, but states this is different. She reports medication compliance. She had maintained NSR, and was taken off Coumadin by Dr. [name]. She experiences tachy-arrhythmias in a-fib, but denies any of these episodes. Of note, there has been a ? Dx of sarcoid, but pulm 02/13 office note reported no convincing radiographic or clinical evidence of dz. Denies tobacco, EtOH or elicit drug use. She took NTG SL x 2 without relief, called the office and presented to the ED.   There, EKG reveals NSR, low-voltage, borderline LAD and no ischemic changes. Initial trop-I WNL. BMET reveals mildly elevated Cr at 1.19 (baseline 1-1.1). H/H 11/33.0. Portable CXR without acute process. VSS.   Problem List: Past Medical History  Diagnosis Date  . Diabetes mellitus     type 2  . Hypertension   . Hyperlipidemia   . Paroxysmal a-fib     coumadin ( Chads2=2) Echo 09/05/10 EF 55-60%; mod LVH  . LVH (left ventricular hypertrophy)   . Asthma   . Arthritis   .  Chronic headaches   . Colon polyps   . Depression   . Diverticulosis   . Fibromyalgia   . Gallstones   . IBS (irritable bowel syndrome)   . Kidney stones   . Peripheral neuropathy     Past Surgical History  Procedure Date  . Left ankle fracture     closed reduction March 2008  . Wrist arthroscopy     June 2003  . Carpal tunnel release 2003    bilateral  . Subcutaneous transposition of left ulnar nerve   . Abdominal hysterectomy   . Cholecystectomy   . Colon surgery   . Elbow surgery     bilateral  . Esophageal manometry 03/21/2012    Procedure: ESOPHAGEAL MANOMETRY (EM);  Surgeon: [name], MD;  Location: [facility];  Service: Endoscopy;  Laterality: N/A;     Allergies:  Allergies  Allergen Reactions  . Amitriptyline Other (See Comments)    Disoriented.  . Hydromorphone Hcl Other (See Comments)    blood pressure drops.    Home Medications: Prior to Admission medications   Medication Sig Start Date End Date Taking? Authorizing Provider  albuterol (PROVENTIL HFA;VENTOLIN HFA) 108 (90 BASE) MCG/ACT inhaler Inhale 2 puffs into the lungs every 4 (four) hours as needed for wheezing or shortness of breath. 10/19/11  Yes Tammy S Parrett, NP  aspirin EC 81 MG tablet Take 81 mg by mouth daily.   Yes Historical Provider, MD  Azilsartan-Chlorthalidone 40-12.5 MG TABS Take 1 tablet by mouth daily.   Yes Historical Provider, MD  calcium carbonate (OS-CAL)  600 MG TABS Take 1 tablet (600 mg total) by mouth daily. 10/19/11  Yes Tammy S Parrett, NP  cholecalciferol (VITAMIN D) 1000 UNITS tablet Take 2,000 Units by mouth daily.   Yes Historical Provider, MD  Cranberry 500 MG CAPS Take 1 capsule (500 mg total) by mouth daily. 10/19/11  Yes Tammy S Parrett, NP  diltiazem (TIAZAC) 180 MG 24 hr capsule Take 180 mg by mouth at bedtime.   Yes Historical Provider, MD  dofetilide ([name]) 250 MCG capsule Take 1 capsule (250 mcg total) by mouth 2 (two) times daily. 02/15/12  Yes [name], MD    esomeprazole (NEXIUM) 40 MG capsule Take 40 mg by mouth 2 (two) times daily.   Yes Historical Provider, MD  famotidine (PEPCID) 20 MG tablet Take 20 mg by mouth at bedtime.   Yes Historical Provider, MD  ferrous sulfate 325 (65 FE) MG tablet Take 1 tablet (325 mg total) by mouth daily. 10/19/11 10/18/12 Yes Tammy S Parrett, NP  insulin glargine (LANTUS) 100 UNIT/ML injection Inject 40 Units into the skin at bedtime. 10/19/11  Yes Tammy S Parrett, NP  Liraglutide (VICTOZA) 18 MG/3ML SOLN Inject 1.8 mg into the skin daily.   Yes Historical Provider, MD  loratadine (CLARITIN) 10 MG tablet Take 10 mg by mouth every morning.   Yes Historical Provider, MD  metFORMIN (GLUCOPHAGE) 500 MG tablet Take 500 mg by mouth 2 (two) times daily with a meal.   Yes Historical Provider, MD  mirtazapine (REMERON) 15 MG tablet Take 15 mg by mouth at bedtime.   Yes Historical Provider, MD  niacin (NIASPAN) 500 MG CR tablet Take 500 mg by mouth at bedtime.    Yes Historical Provider, MD  NITROSTAT 0.4 MG SL tablet place 1 tablet under the tongue if needed every 5 minutes for chest pain for 3 doses IF NO RELIEF AFTER 3RD DOSE CALL PRESCRIBER OR 911. 06/02/12  Yes Wendall Stade, MD  potassium chloride (K-DUR,KLOR-CON) 10 MEQ tablet Take 1 tablet (10 mEq total) by mouth daily. 12/17/11 12/16/12 Yes Wendall Stade, MD  rizatriptan (MAXALT) 10 MG tablet Take 10 mg by mouth as needed. For migraines. May repeat in 2 hours if needed.   Yes Historical Provider, MD  rosuvastatin (CRESTOR) 10 MG tablet Take 10 mg by mouth at bedtime.    Yes Historical Provider, MD  traMADol (ULTRAM) 50 MG tablet Take 50 mg by mouth every 6 (six) hours as needed. For pain.   Yes Historical Provider, MD  triamcinolone (NASACORT) 55 MCG/ACT nasal inhaler Place 2 sprays into the nose 2 (two) times daily.    Yes Historical Provider, MD  vitamin C (ASCORBIC ACID) 500 MG tablet Take 1 tablet (500 mg total) by mouth daily. 10/19/11 10/18/12 Yes Tammy S Parrett, NP     Inpatient Medications:     . aspirin  324 mg Oral Once  .  morphine injection  4 mg Intravenous Once  . ondansetron (ZOFRAN) IV  4 mg Intravenous Once    (Not in a hospital admission)  Family History  Problem Relation Age of Onset  . Heart attack Mother     @ age 60  . Heart attack Brother     @ age 39  . Colon cancer Maternal Aunt   . Mental illness Mother   . Prostate cancer Maternal Grandfather   . Ovarian cancer Maternal Aunt     and cousin  . Diabetes Mother   . Diabetes  siblings  . Hypertension Mother     siblings  . Lupus Sister      History   Social History  . Marital Status: Divorced    Spouse Name: N/A    Number of Children: 3  . Years of Education: N/A   Occupational History  . disabled     CNA   Social History Main Topics  . Smoking status: Never Smoker   . Smokeless tobacco: Never Used  . Alcohol Use: No  . Drug Use: No  . Sexually Active: Not on file   Other Topics Concern  . Not on file   Social History Narrative  . No narrative on file     Review of Systems: General: negative for chills, fever, night sweats or weight changes.  Cardiovascular: positive for chest pain, dyspnea on exertion, edema, orthopnea, palpitations, paroxysmal nocturnal dyspnea or shortness of breath Dermatological: negative for rash Respiratory:  negative for cough or wheezing Urologic: negative for hematuria Abdominal: positive for nausea, negative for vomiting, diarrhea, bright red blood per rectum, melena, or hematemesis Neurologic: positive for visual changes and lightheadedness, negative for syncope and dizziness All other systems reviewed and are otherwise negative except as noted above.  Physical Exam: Blood pressure 114/62, pulse 82, temperature 98 F (36.7 C), temperature source Oral, resp. rate 20, SpO2 99.00%.   General: Well developed, well nourished, in no acute distress. Head: Normocephalic, atraumatic, sclera non-icteric, no xanthomas,  nares are without discharge.  Neck: Negative for carotid bruits. JVD not elevated. Lungs: Clear bilaterally to auscultation without wheezes, rales, or rhonchi. Breathing is unlabored. Heart: RRR with S1 S2. No murmurs, rubs, or gallops appreciated. Abdomen: Soft, non-tender, non-distended with normoactive bowel sounds. No hepatomegaly. No rebound/guarding. No obvious abdominal masses. Msk:  Acute tenderness to palpation of L chest intercostal region and L sternal border. Strength and tone appears normal for age. Extremities: No clubbing, cyanosis or edema.  Distal pedal pulses are 2+ and equal bilaterally. Neuro: Alert and oriented X 3. Moves all extremities spontaneously. Psych:  Responds to questions appropriately with a normal affect.  Labs: Recent Labs  Basename 06/16/12 1050   WBC 3.8*   HGB 11.0*   HCT 33.0*   MCV 87.8   PLT 169   Lab 06/16/12 1050  NA 139  K 3.6  CL 102  CO2 27  BUN 11  CREATININE 1.19*  CALCIUM 9.7  PROT 7.5  BILITOT 0.3  ALKPHOS 81  ALT 37*  AST 32  AMYLASE --  LIPASE --  GLUCOSE 143*   Radiology/Studies: Dg Chest Portable 1 View  06/16/2012  *RADIOLOGY REPORT*  Clinical Data: Chest pain  PORTABLE CHEST - 1 VIEW  Comparison: 08/25/11  Findings: Cardiomediastinal silhouette is stable.  No acute infiltrate or pleural effusion.  No pulmonary edema.  Bony thorax is stable.  IMPRESSION: No active disease.  No significant change.   Original Report Authenticated By: Natasha Mead, M.D.    [facility]  06/15/2012  *RADIOLOGY REPORT*  Clinical Data:  Recall from screening mammography.  DIGITAL DIAGNOSTIC LEFT BREAST MAMMOGRAM  Comparison:  Prior studies  Findings:  Additional views of the left breast demonstrate no persistent abnormality.  The appearance noted on the screening study is consistent with a summation shadow.  IMPRESSION: No persistent abnormality on additional evaluation of the left breast.  The appearance is consistent with a summation  shadow.  RECOMMENDATION: Screening mammography in 1 year.  I have discussed the findings and recommendations with  the patient. Results were also provided in writing at the conclusion of the visit.  BI-RADS CATEGORY 1:  Negative.   Original Report Authenticated By: Rolla Plate, M.D.    [facility] Digital Screening  06/08/2012  *RADIOLOGY REPORT*  Clinical Data: Screening.  DIGITAL BILATERAL SCREENING MAMMOGRAM WITH CAD  Comparison:  Previous exams.  Findings: Two views of each breast demonstrate scattered fibroglandular tissue.  In the left breast, a possible mass warrants further evaluation with spot compression views and possibly ultrasound.  In the right breast, no masses or malignant type calcifications are identified.  Images were processed with CAD.  IMPRESSION: Further evaluation is suggested for possible mass in the left breast.  RECOMMENDATION: Diagnostic mammogram and possibly ultrasound of the left breast. (Code:FI-L-30M)  BI-RADS CATEGORY 0:  Incomplete.  Need additional imaging evaluation and/or prior mammograms for comparison.   Original Report Authenticated By: Arnell Sieving, M.D.    EKG: NSR, 91 bpm, low-voltage QRS, poor R wave progression, QTc 477 ms, no ST/T changes  ASSESSMENT:   1. Precordial pain 2. PAF 3. DM 4. HTN 5. HL 6. Peripheral neuropathy 7. Fibromyalgia  DISCUSSION/PLAN:  Patient with several cardiac RFs, but normal cardiac cath last year presenting with atypical, sharp, intermittent chest pain radiating to her arm this AM which is tender to palpation and unrelieved with NTG. No prior episodes of chest pain, SOB, DOE, orthopnea, PND, LEE, palpitations or syncope leading to today. Denies experiencing tachy-arrhythmias associated with PAF during this time. Chest pain is quite atypical for cardiac etiology and more c/w musculoskeletal inflammatory causes- costochondritis, intercostal neuritis- especially given underlying DM with peripheral neuropathy. She tells me she  has seen a neurologist recently and with MRI revealing degenerative spondylosis with osteophytic encroachment on both C6 neural foramina raising suspicion of DDD w/ radiculopathy as a cause of L arm pain. Additionally, patient does have fibromyalgia which is described as sharp, shooting pain similar to what she is experiencing today. Ok to d/c from ED. Advise trial of NSAIDs given likely inflammatory component. Follow-up with neurology for neuropathic pain qualities, and to consider such agents as neurontin or pregabalin. Continue home medications otherwise.   Signed, R. Hurman Horn, PA-C 06/16/2012, 1:50 PM   Patient seen and examined independently. [name]'s, PA-C note reviewed carefully - agree with his assessment and plan. I have edited the note based on my findings.   Chest pain very atypical. Likely precordial in nature. ECG, CE and recent cath all normal. Very unlikely to be cardiac in nature. OK for d/c from cardiac standpoint. F/u with Dr. Parke Simmers.  Truman Hayward 3:32 PM

## 2012-06-16 NOTE — ED Notes (Signed)
Patient took Nitro x 2 pta at home and reports no change in pain, only started having headache

## 2012-06-16 NOTE — ED Notes (Signed)
Patient states she started having chest pain in left chest radiating into left arm starting at 0700 this am

## 2012-06-16 NOTE — ED Notes (Signed)
Patient placed on O2 at 2 lpm via [place] 

## 2012-06-16 NOTE — ED Provider Notes (Addendum)
History     CSN: [id number]  Arrival date & time 06/16/12  1008   First MD Initiated Contact with Patient 06/16/12 1017      Chief Complaint  Patient presents with  . Chest Pain    (Consider location/radiation/quality/duration/timing/severity/associated sxs/prior treatment) HPI Chest pain upon waking this AM.  Pain described as discomfort, 9/10 in intensity. The location of the patient's problem is left side chest with radiation to left neck and left arm.  Onset was suddenly this AM upon waking with unchanged course since that time.   Modifying factors:  Slightly improved with SL nitro.  Associated symptoms: mild SOB, no emesis, headache after nitro.   Past Medical History  Diagnosis Date  . Diabetes mellitus     type 2  . Hypertension   . Hyperlipidemia   . Paroxysmal a-fib     coumadin ( Chads2=2) Echo 09/05/10 EF 55-60%; mod LVH  . LVH (left ventricular hypertrophy)   . Asthma   . Arthritis   . Chronic headaches   . Colon polyps   . Depression   . Diverticulosis   . Fibromyalgia   . Gallstones   . IBS (irritable bowel syndrome)   . Kidney stones   . Peripheral neuropathy     Past Surgical History  Procedure Date  . Left ankle fracture     closed reduction March 2008  . Wrist arthroscopy     June 2003  . Carpal tunnel release 2003    bilateral  . Subcutaneous transposition of left ulnar nerve   . Abdominal hysterectomy   . Cholecystectomy   . Colon surgery   . Elbow surgery     bilateral  . Esophageal manometry 03/21/2012    Procedure: ESOPHAGEAL MANOMETRY (EM);  Surgeon: [name], MD;  Location: [facility];  Service: Endoscopy;  Laterality: N/A;    Family History  Problem Relation Age of Onset  . Heart attack Mother     @ age 32  . Heart attack Brother     @ age 57  . Colon cancer Maternal Aunt   . Mental illness Mother   . Prostate cancer Maternal Grandfather   . Ovarian cancer Maternal Aunt     and cousin  . Diabetes Mother   .  Diabetes      siblings  . Hypertension Mother     siblings  . Lupus Sister     History  Substance Use Topics  . Smoking status: Never Smoker   . Smokeless tobacco: Never Used  . Alcohol Use: No    OB History    Grav Para Term Preterm Abortions TAB SAB Ect Mult Living                  Review of Systems Negative for respiratory distress, cough. Negative for vomiting, diarrhea.  All other systems reviewed and negative unless noted in HPI.    Allergies  Amitriptyline and Hydromorphone hcl  Home Medications   Current Outpatient Rx  Name Route Sig Dispense Refill  . ALBUTEROL SULFATE HFA 108 (90 BASE) MCG/ACT IN AERS Inhalation Inhale 2 puffs into the lungs every 4 (four) hours as needed for wheezing or shortness of breath.    . ASPIRIN EC 81 MG PO TBEC Oral Take 81 mg by mouth daily.    . AZILSARTAN-CHLORTHALIDONE 40-12.5 MG PO TABS Oral Take 1 tablet by mouth daily.    Marland Kitchen CALCIUM CARBONATE 600 MG PO TABS Oral Take 1 tablet (600 mg total)  by mouth daily.    Marland Kitchen VITAMIN D 1000 UNITS PO TABS Oral Take 2,000 Units by mouth daily.    Marland Kitchen CRANBERRY 500 MG PO CAPS Oral Take 1 capsule (500 mg total) by mouth daily.    Marland Kitchen DILTIAZEM HCL ER BEADS 180 MG PO CP24 Oral Take 180 mg by mouth at bedtime.    . DOFETILIDE 250 MCG PO CAPS Oral Take 1 capsule (250 mcg total) by mouth 2 (two) times daily. 60 capsule 1  . ESOMEPRAZOLE MAGNESIUM 40 MG PO CPDR Oral Take 40 mg by mouth 2 (two) times daily.    Marland Kitchen FAMOTIDINE 20 MG PO TABS Oral Take 20 mg by mouth at bedtime.    Marland Kitchen FERROUS SULFATE 325 (65 FE) MG PO TABS Oral Take 1 tablet (325 mg total) by mouth daily.    . INSULIN GLARGINE 100 UNIT/ML [place] SOLN Subcutaneous Inject 40 Units into the skin at bedtime.    Marland Kitchen LIRAGLUTIDE 18 MG/3ML [place] SOLN Subcutaneous Inject 1.8 mg into the skin daily.    Marland Kitchen LORATADINE 10 MG PO TABS Oral Take 10 mg by mouth every morning.    Marland Kitchen METFORMIN HCL 500 MG PO TABS Oral Take 500 mg by mouth 2 (two) times daily with a meal.      . MIRTAZAPINE 15 MG PO TABS Oral Take 15 mg by mouth at bedtime.    Marland Kitchen NIACIN ER (ANTIHYPERLIPIDEMIC) 500 MG PO TBCR Oral Take 500 mg by mouth at bedtime.     Marland Kitchen NITROSTAT 0.4 MG SL SUBL  place 1 tablet under the tongue if needed every 5 minutes for chest pain for 3 doses IF NO RELIEF AFTER 3RD DOSE CALL PRESCRIBER OR 911. 25 each 12  . POTASSIUM CHLORIDE CRYS ER 10 MEQ PO TBCR Oral Take 1 tablet (10 mEq total) by mouth daily. 30 tablet 11  . RIZATRIPTAN BENZOATE 10 MG PO TABS Oral Take 10 mg by mouth as needed. For migraines. May repeat in 2 hours if needed.    Marland Kitchen ROSUVASTATIN CALCIUM 10 MG PO TABS Oral Take 10 mg by mouth at bedtime.     . TRAMADOL HCL 50 MG PO TABS Oral Take 50 mg by mouth every 6 (six) hours as needed. For pain.    . TRIAMCINOLONE ACETONIDE 55 MCG/ACT NA INHA Nasal Place 2 sprays into the nose 2 (two) times daily.     Marland Kitchen VITAMIN C 500 MG PO TABS Oral Take 1 tablet (500 mg total) by mouth daily.      BP 137/73  Temp 98 F (36.7 C) (Oral)  Resp 16  SpO2 99%  Physical Exam Nursing note and vitals reviewed.  Constitutional: Pt is alert and appears stated age. Oropharynx: Airway open without erythema or exudate. Respiratory: No respiratory distress. Equal breathing bilaterally. CV: Extremities warm and well perfused. No significant edema. Neuro: No motor nor sensory deficit. Head: Normocephalic and atraumatic. Eyes: No conjunctivitis, no scleral icterus. Neck: Supple, no mass. Chest: Non-tender. Abdomen: Soft, non-tender MSK: Extremities are atraumatic without deformity. Skin: No rash, no wounds.  ED Course  Procedures (including critical care time)  Labs Reviewed  CBC WITH DIFFERENTIAL - Abnormal; Notable for the following:    WBC 3.8 (*)     RBC 3.76 (*)     Hemoglobin 11.0 (*)     HCT 33.0 (*)     Neutrophils Relative 42 (*)     Neutro Abs 1.6 (*)     All other components within normal limits  COMPREHENSIVE METABOLIC PANEL - Abnormal; Notable for the  following:    Glucose, Bld 143 (*)     Creatinine, Ser 1.19 (*)     ALT 37 (*)     GFR calc non Af Amer 49 (*)     GFR calc Af Amer 56 (*)     All other components within normal limits  MAGNESIUM  POCT I-STAT TROPONIN I   Dg Chest Portable 1 View  06/16/2012  *RADIOLOGY REPORT*  Clinical Data: Chest pain  PORTABLE CHEST - 1 VIEW  Comparison: 08/25/11  Findings: Cardiomediastinal silhouette is stable.  No acute infiltrate or pleural effusion.  No pulmonary edema.  Bony thorax is stable.  IMPRESSION: No active disease.  No significant change.   Original Report Authenticated By: Natasha Mead, M.D.    [facility] [employer] L  06/15/2012  *RADIOLOGY REPORT*  Clinical Data:  Recall from screening mammography.  DIGITAL DIAGNOSTIC LEFT BREAST MAMMOGRAM  Comparison:  Prior studies  Findings:  Additional views of the left breast demonstrate no persistent abnormality.  The appearance noted on the screening study is consistent with a summation shadow.  IMPRESSION: No persistent abnormality on additional evaluation of the left breast.  The appearance is consistent with a summation shadow.  RECOMMENDATION: Screening mammography in 1 year.  I have discussed the findings and recommendations with the patient. Results were also provided in writing at the conclusion of the visit.  BI-RADS CATEGORY 1:  Negative.   Original Report Authenticated By: Rolla Plate, M.D.      1. Chest pain       MDM  61 y.o. female here with chest pain.  Pertinent past problems include DM, HtN, HL, paroxysmal a fib on tikosin. Concerned for ACS. Doubt dissection, PE, esophageal rupture.  Medications/interventions:  ASA, IV morphine, IV zofran.  Data reviewed: Record review: Note from shows clean cath in 2012.  EKG ordered and interpreted by me: NSR, no QRS widening, nonspecific ST-T wave changes and no significant changes from prior EKG.  Lab tests ordered and reviewed by me: troponin low, CBC, CMP unremarkable. . I  independently viewed the following imaging studies and reviewed radiology's interpretation as summarized: CXR with no acute disease.  Course of care: Pt reports subsiding pain on re-eval. Resting comfortably, no acute distress. Cards called.   Cards does not feel it is a cardiac source and recommends no further work up at this time. Will d/c with close f/u. Counseling provided regarding diagnosis, treatment plan, follow up recommendations, and return precautions. Questions answered.   Medical Decision Making discussed with ED attending Celene Kras, MD         Charm Barges, MD 06/16/12 [phone]

## 2012-06-16 NOTE — ED Provider Notes (Signed)
I saw and evaluated the patient, reviewed the resident's note and I agree with the findings and plan.  Pt has no known history of CAD but sees her cardiologist for paroxysmal a fib.  Pt presents with chest pain, left sided, some radiation to LUE.  Had a negative stress in the past but years ago per patient.  Considering her HTN, and DM will consult with her cardiologist regarding further workup  Celene Kras, MD 06/16/12 1225

## 2012-06-22 NOTE — Telephone Encounter (Signed)
SPOKE WITH PT CONTINUES TO C/O  OF ELEVATED HEART RATE   RUNNING 90-100  HAD STEROID INJ IN PAST WAS TOLD  TO ALLOW  TIME FOR MED TO WEAR OFF COULD CAUSE AN ELEVATION  IN THE MEANTIME WAS STARTED ON    SIMBACORT  ON  MON PT AWARE WILL FORWARD TO DR [name] FOR REVIEW .Zack Seal

## 2012-06-23 MED ORDER — CARVEDILOL 12.5 MG PO TABS: 12.5000 mg | ORAL_TABLET | Freq: Two times a day (BID) | ORAL | Status: DC

## 2012-06-23 NOTE — Telephone Encounter (Signed)
PT AWARE .  APPT MADE  FOR 06-29-12 WITH MICHELE LENZE PAC /CY

## 2012-06-23 NOTE — Telephone Encounter (Signed)
Stop cardizem and start coreg 12.5 bid  F/U PA for ECG and R/O PAF next week

## 2012-06-29 ENCOUNTER — Encounter: Payer: Self-pay | Admitting: Physician Assistant

## 2012-06-29 ENCOUNTER — Ambulatory Visit (INDEPENDENT_AMBULATORY_CARE_PROVIDER_SITE_OTHER): Payer: Medicaid Other | Admitting: Physician Assistant

## 2012-06-29 VITALS — BP 110/70 | HR 89 | Ht 67.0 in | Wt 205.0 lb

## 2012-06-29 DIAGNOSIS — I4891 Unspecified atrial fibrillation: Secondary | ICD-10-CM

## 2012-06-29 DIAGNOSIS — R55 Syncope and collapse: Secondary | ICD-10-CM | POA: Insufficient documentation

## 2012-06-29 DIAGNOSIS — R079 Chest pain, unspecified: Secondary | ICD-10-CM

## 2012-06-29 DIAGNOSIS — I951 Orthostatic hypotension: Secondary | ICD-10-CM

## 2012-06-29 MED ORDER — AZILSARTAN-CHLORTHALIDONE 40-12.5 MG PO TABS: 0.5000 | ORAL_TABLET | Freq: Every day | ORAL | Status: DC

## 2012-06-29 NOTE — Assessment & Plan Note (Signed)
Patient is orthostatic in the office today.I have asked her to decrease her Azilsartan-chlorthalidone to half a tablet daily and discuss further treatment with Dr. Parke Simmers this afternoon.

## 2012-06-29 NOTE — Assessment & Plan Note (Signed)
Patient had an episode of syncope Monday when she awakened and was nauseated. She stood up quickly and passed out but lost control of her bowels and vomited. I discussed this patient with Dr. Charlton Haws who recommended she followup with her primary care and neurologist. He does not feel this is related to a cardiac issue or her [name]. She does have a history of orthostatic hypotension and has to take her time getting up. This could have contributed to her syncope.She is orthostatic in the office today. I've asked her to decrease her Azilsartan- chlorthalidone to half a tablet daily and discuss with Dr. Parke Simmers this afternoon

## 2012-06-29 NOTE — Assessment & Plan Note (Signed)
Patient had a recent ER visit for chest pain which was atypical and cardiac enzymes are negative. She had normal cardiac catheterization in May 2012. No further workup at this time.

## 2012-06-29 NOTE — Progress Notes (Signed)
HPI:  This is a 61 year old African American female patient Dr. Ocie Bob, and Charlton Haws, and Dr. Veatrice Kells.she has history of paroxysmal atrial fibrillation treated with [name] since 2011. Check cardiac catheterization and 5/12 demonstrated normal coronary arteries. Last echo ejection fraction was 60-65% with mild LVH and mild left atrial enlargement. She has chronic asthma and has a questionable diagnosis of sarcoid.   She was recently seen in the emergency room on 04/21/12 with atypical chest pain described as sharp shooting that occurred at rest. EKG showed no acute change and troponins were negative. The patient is here for followup of her emergency room visit. She states on Monday night she woke up at 3 AM nauseated. She got up quickly and could not reach her trash can and passed out. She does have a history of orthostatic hypotension. She said she woke up and vomited and had lost control of her bowels. She said this is the third time it has happened in the past several years. This is the second time this year. She does have an appointment with her primary care physician today to followup on this.  Allergies:  -- Amitriptyline -- Other (See Comments)   --  Disoriented.  -- Hydromorphone Hcl -- Other (See Comments)   --  blood pressure drops.  Current Outpatient Prescriptions on File Prior to Visit: albuterol (PROVENTIL HFA;VENTOLIN HFA) 108 (90 BASE) MCG/ACT inhaler, Inhale 2 puffs into the lungs every 4 (four) hours as needed for wheezing or shortness of breath., Disp: , Rfl:  aspirin EC 81 MG tablet, Take 81 mg by mouth daily., Disp: , Rfl:  Azilsartan-Chlorthalidone 40-12.5 MG TABS, Take 1 tablet by mouth daily., Disp: , Rfl:  budesonide-formoterol (SYMBICORT) 80-4.5 MCG/ACT inhaler, Inhale 2 puffs into the lungs 2 (two) times daily., Disp: , Rfl:  calcium carbonate (OS-CAL) 600 MG TABS, Take 1 tablet (600 mg total) by mouth daily., Disp: , Rfl:  carvedilol (COREG) 12.5 MG  tablet, Take 1 tablet (12.5 mg total) by mouth 2 (two) times daily., Disp: 180 tablet, Rfl: 3 cholecalciferol (VITAMIN D) 1000 UNITS tablet, Take 2,000 Units by mouth daily., Disp: , Rfl:  clonazePAM (KLONOPIN) 0.5 MG tablet, Take 0.5 mg by mouth at bedtime., Disp: , Rfl:  Cranberry 500 MG CAPS, Take 1 capsule (500 mg total) by mouth daily., Disp: , Rfl:  dofetilide ([name]) 250 MCG capsule, Take 1 capsule (250 mcg total) by mouth 2 (two) times daily., Disp: 60 capsule, Rfl: 1 esomeprazole (NEXIUM) 40 MG capsule, Take 40 mg by mouth 2 (two) times daily., Disp: , Rfl:  ferrous sulfate 325 (65 FE) MG tablet, Take 1 tablet (325 mg total) by mouth daily., Disp: , Rfl:  insulin glargine (LANTUS) 100 UNIT/ML injection, Inject 40 Units into the skin at bedtime., Disp: , Rfl:  Liraglutide (VICTOZA) 18 MG/3ML SOLN, Inject 1.8 mg into the skin daily., Disp: , Rfl:  loratadine (CLARITIN) 10 MG tablet, Take 10 mg by mouth every morning., Disp: , Rfl:  metFORMIN (GLUCOPHAGE) 500 MG tablet, Take 500 mg by mouth 2 (two) times daily with a meal., Disp: , Rfl:  mirtazapine (REMERON) 15 MG tablet, Take 15 mg by mouth at bedtime., Disp: , Rfl:  niacin (NIASPAN) 500 MG CR tablet, Take 500 mg by mouth at bedtime. , Disp: , Rfl:  NITROSTAT 0.4 MG SL tablet, place 1 tablet under the tongue if needed every 5 minutes for chest pain for 3 doses IF NO RELIEF AFTER 3RD DOSE CALL PRESCRIBER OR 911., Disp: 25  each, Rfl: 12 PARoxetine (PAXIL-CR) 12.5 MG 24 hr tablet, Take 12.5 mg by mouth every morning., Disp: , Rfl:  potassium chloride (K-DUR,KLOR-CON) 10 MEQ tablet, Take 1 tablet (10 mEq total) by mouth daily., Disp: 30 tablet, Rfl: 11 rizatriptan (MAXALT) 10 MG tablet, Take 10 mg by mouth as needed. For migraines. May repeat in 2 hours if needed., Disp: , Rfl:  rosuvastatin (CRESTOR) 10 MG tablet, Take 10 mg by mouth at bedtime. , Disp: , Rfl:  traMADol (ULTRAM) 50 MG tablet, Take 50 mg by mouth every 6 (six) hours as  needed. For pain., Disp: , Rfl:  triamcinolone (NASACORT) 55 MCG/ACT nasal inhaler, Place 2 sprays into the nose 2 (two) times daily. , Disp: , Rfl:  vitamin C (ASCORBIC ACID) 500 MG tablet, Take 1 tablet (500 mg total) by mouth daily., Disp: , Rfl:     Past Medical History:   Diabetes mellitus                                              Comment:type 2   Hypertension                                                 Hyperlipidemia                                               Paroxysmal a-fib                                               Comment:coumadin ( Chads2=2) Echo 09/05/10 EF 55-60%;               mod LVH   LVH (left ventricular hypertrophy)                           Asthma                                                       Arthritis                                                    Chronic headaches                                            Colon polyps  Depression                                                   Diverticulosis                                               Fibromyalgia                                                 Gallstones                                                   IBS (irritable bowel syndrome)                               Kidney stones                                                Peripheral neuropathy                                       Past Surgical History:   left ankle fracture                                            Comment:closed reduction March 2008   WRIST ARTHROSCOPY                                              Comment:June 2003   CARPAL TUNNEL RELEASE                           2003           Comment:bilateral   subcutaneous transposition of left ulnar nerve               ABDOMINAL HYSTERECTOMY                                       CHOLECYSTECTOMY                                              COLON SURGERY  ELBOW SURGERY                                                   Comment:bilateral   ESOPHAGEAL MANOMETRY                            03/21/2012       Comment:Procedure: ESOPHAGEAL MANOMETRY (EM);  Surgeon:              [name], MD;  Location: [facility];                Service: Endoscopy;  Laterality: N/A;  Review of patient's family history indicates:   Heart attack                   Mother                     Comment: @ age 38   Heart attack                   Brother                    Comment: @ age 69   Colon cancer                   Maternal Aunt            Mental illness                 Mother                   Prostate cancer                Maternal Grandfather     Ovarian cancer                 Maternal Aunt              Comment: and cousin   Diabetes                       Mother                   Diabetes                                                  Comment: siblings   Hypertension                   Mother                     Comment: siblings   Lupus                          Sister                   Social History   Marital Status: Divorced            Spouse Name:                      Years of Education:  Number of children: 3           Occupational History Occupation          Psychiatric nurse              disabled                                CNA  Social History Main Topics   Smoking Status: Never Smoker                     Smokeless Status: Never Used                       Alcohol Use: No             Drug Use: No             Sexual Activity: Not on file        Other Topics            Concern   None on file  Social History Narrative   None on file    ROS:she has chronic problems with her neck peripheral neuropathy and fibromyalgia, chronic cough and wheezing,see history of present illness otherwise negative   PHYSICAL EXAM: Well-nournished, in no acute distress. Neck: No JVD, HJR, Bruit, or thyroid enlargement  Lungs: decreased breath  sounds throughout, patient is coughing,No tachypnea, clear without wheezing, rales, or rhonchi  Cardiovascular: RRR, PMI not displaced,positive S4, no bruit, thrill, or heave.  Abdomen: BS normal. Soft without organomegaly, masses, lesions or tenderness.  Extremities: without cyanosis, clubbing or edema. Good distal pulses bilateral  SKin: dry and scaling,Warm, no lesions or rashes   Musculoskeletal: No deformities  Neuro: no focal signs  BP 116/68  Pulse 88  Ht 5\' 7"  (1.702 m)  Wt 205 lb (92.987 kg)  BMI 32.11 kg/m2  SpO2 98%   ZOX:WRUEAV sinus rhythm with nonspecific ST-T wave

## 2012-06-29 NOTE — Patient Instructions (Signed)
**Note De-Identified Zyairah Wacha Obfuscation** Your physician has recommended you make the following change in your medication: decrease Azilsartan to 1/2 tablet daily  Your physician wants you to follow-up in: 4 months. You will receive a reminder letter in the mail two months in advance. If you don't receive a letter, please call our office to schedule the follow-up appointment.

## 2012-06-29 NOTE — Assessment & Plan Note (Signed)
Patient is in normal sinus rhythm today 

## 2012-07-01 ENCOUNTER — Other Ambulatory Visit: Payer: Self-pay | Admitting: Neurosurgery

## 2012-07-04 ENCOUNTER — Encounter (HOSPITAL_COMMUNITY): Payer: Self-pay | Admitting: Respiratory Therapy

## 2012-07-07 ENCOUNTER — Encounter (HOSPITAL_COMMUNITY): Payer: Self-pay

## 2012-07-07 ENCOUNTER — Encounter (HOSPITAL_COMMUNITY)
Admission: RE | Admit: 2012-07-07 | Discharge: 2012-07-07 | Disposition: A | Discharge status: Home / Self Care | Payer: Medicaid Other | Source: Ambulatory Visit | Attending: Neurosurgery | Admitting: Neurosurgery

## 2012-07-07 HISTORY — DX: Sarcoidosis of lung: D86.0

## 2012-07-07 HISTORY — DX: Unspecified convulsions: R56.9

## 2012-07-07 LAB — SURGICAL PCR SCREEN
MRSA, PCR: NEGATIVE
Staphylococcus aureus: NEGATIVE

## 2012-07-07 LAB — BASIC METABOLIC PANEL
BUN: 12 mg/dL (ref 6–23)
Calcium: 10.1 mg/dL (ref 8.4–10.5)
GFR calc Af Amer: 56 mL/min — ABNORMAL LOW (ref >90)
GFR calc non Af Amer: 49 mL/min — ABNORMAL LOW (ref >90)
Potassium: 3.6 mEq/L (ref 3.5–5.1)
Sodium: 133 mEq/L — ABNORMAL LOW (ref 135–145)

## 2012-07-07 LAB — CBC
Hemoglobin: 11.1 g/dL — ABNORMAL LOW (ref 12.0–15.0)
MCH: 29.1 pg (ref 26.0–34.0)
MCHC: 33 g/dL (ref 30.0–36.0)
Platelets: 210 10*3/uL (ref 150–400)

## 2012-07-07 NOTE — Progress Notes (Signed)
07/07/12 1252  OBSTRUCTIVE SLEEP APNEA  Have you ever been diagnosed with sleep apnea through a sleep study? No  Do you snore loudly (loud enough to be heard through closed doors)?  1  Do you often feel tired, fatigued, or sleepy during the daytime? 1  Has anyone observed you stop breathing during your sleep? 1  Do you have, or are you being treated for high blood pressure? 1  BMI more than 35 kg/m2? 0  Age over 60 years old? 1  Gender: 0  Obstructive Sleep Apnea Score 5   Score 4 or greater  Results sent to PCP    

## 2012-07-07 NOTE — Telephone Encounter (Signed)
New  Problem:   Need to stop ASA. Due to neck surgery with Dr. Lovell Sheehan . appt is  11/25.

## 2012-07-07 NOTE — Progress Notes (Signed)
REQUESTED SUSAN FROM DR. JENKINS OFFICE,  FAX CARDIAC CLEARANCE .

## 2012-07-07 NOTE — Telephone Encounter (Signed)
Will forward for dr [name] review

## 2012-07-07 NOTE — Telephone Encounter (Signed)
Yes

## 2012-07-07 NOTE — Pre-Procedure Instructions (Signed)
20 [name]  07/07/2012   Your procedure is scheduled on:  Monday, November 25th  Report to [name] at 0630 AM.  Call this number if you have problems the morning of surgery: [phone]   Remember:   Do not eat food or drink:After Midnight.   Take these medicines the morning of surgery with A SIP OF WATER: coreg, albuterol, tikosyn, nexium, claritin, nasacort, ultram if needed   Do not wear jewelry, make-up or nail polish.  Do not wear lotions, powders, or perfumes.   Do not shave 48 hours prior to surgery.   Do not bring valuables to the hospital.  Contacts, dentures or bridgework may not be worn into surgery.  Leave suitcase in the car. After surgery it may be brought to your room.  For patients admitted to the hospital, checkout time is 11:00 AM the day of discharge.   Patients discharged the day of surgery will not be allowed to drive home.    Special Instructions: Shower using CHG 2 nights before surgery and the night before surgery.  If you shower the day of surgery use CHG.  Use special wash - you have one bottle of CHG for all showers.  You should use approximately 1/3 of the bottle for each shower.   Please read over the following fact sheets that you were given: Pain Booklet, Coughing and Deep Breathing, MRSA Information and Surgical Site Infection Prevention

## 2012-07-07 NOTE — Telephone Encounter (Signed)
Spoke with Tinnie Gens, aware of dr Fabio Bering recommendations

## 2012-07-07 NOTE — Progress Notes (Signed)
CALLED DR NISHAN'S OFFICE AND LEFT MESSAGE WITH NURSE TO BE SURE WAS OKAY FOR PATIENT TO STOP ASPIRIN.

## 2012-07-07 NOTE — Progress Notes (Signed)
07/07/12 1252  OBSTRUCTIVE SLEEP APNEA  Have you ever been diagnosed with sleep apnea through a sleep study? No  Do you snore loudly (loud enough to be heard through closed doors)?  1  Do you often feel tired, fatigued, or sleepy during the daytime? 1  Has anyone observed you stop breathing during your sleep? 1  Do you have, or are you being treated for high blood pressure? 1  BMI more than 35 kg/m2? 0  Age over 60 years old? 1  Gender: 0  Obstructive Sleep Apnea Score 5   Score 4 or greater  Results sent to PCP

## 2012-07-07 NOTE — Telephone Encounter (Signed)
Ok to observe

## 2012-07-07 NOTE — Telephone Encounter (Signed)
Okay to hold aspirin. 

## 2012-07-07 NOTE — Progress Notes (Signed)
SPOKE WITH DR. Fabio Bering NURSE WHO STATED IT WAS FINE FOR PATIENT TO STOP ASPIRIN FOR SURGERY.

## 2012-07-07 NOTE — Pre-Procedure Instructions (Signed)
20 Timesha Cervantez Davern  07/07/2012   Your procedure is scheduled on:  Monday  07/11/12    Report to [name] at 630 AM.  Call this number if you have problems the morning of surgery: [phone]   Remember:   Do not eat food OR DRINK :After Midnight.    Take these medicines the morning of surgery with A SIP OF WATER:  ALBUTEROL, SYMBICORT INHALER, CARVEDILOL (COREG), [name], NEXIUM,  POTASSIUM,  ULTRAM IF NEEDED (STOP ASPIRIN, COUMADIN, PLAVIX, EFFIENT, HERBAL MEDICINES)   Do not wear jewelry, make-up or nail polish.  Do not wear lotions, powders, or perfumes. You may wear deodorant.  Do not shave 48 hours prior to surgery. Men may shave face and neck.  Do not bring valuables to the hospital.  Contacts, dentures or bridgework may not be worn into surgery.  Leave suitcase in the car. After surgery it may be brought to your room.  For patients admitted to the hospital, checkout time is 11:00 AM the day of discharge.   Patients discharged the day of surgery will not be allowed to drive home.  Name and phone number of your driver:   Special Instructions: Shower using CHG 2 nights before surgery and the night before surgery.  If you shower the day of surgery use CHG.  Use special wash - you have one bottle of CHG for all showers.  You should use approximately 1/3 of the bottle for each shower.   Please read over the following fact sheets that you were given: Pain Booklet, Coughing and Deep Breathing, MRSA Information and Surgical Site Infection Prevention

## 2012-07-10 MED ORDER — CEFAZOLIN SODIUM-DEXTROSE 2-3 GM-% IV SOLR
2.0000 g | INTRAVENOUS | Status: AC
  Administered 2012-07-11: 2 g via INTRAVENOUS
  Filled 2012-07-10: qty 50

## 2012-07-11 ENCOUNTER — Encounter (HOSPITAL_COMMUNITY): Payer: Self-pay | Admitting: *Deleted

## 2012-07-11 ENCOUNTER — Ambulatory Visit (HOSPITAL_COMMUNITY): Payer: Medicaid Other

## 2012-07-11 ENCOUNTER — Encounter (HOSPITAL_COMMUNITY): Payer: Self-pay | Admitting: Anesthesiology

## 2012-07-11 ENCOUNTER — Encounter (HOSPITAL_COMMUNITY)
Admission: RE | Disposition: A | Discharge status: Home / Self Care | Payer: Self-pay | Source: Ambulatory Visit | Attending: Neurosurgery

## 2012-07-11 ENCOUNTER — Ambulatory Visit (HOSPITAL_COMMUNITY): Payer: Medicaid Other | Admitting: Anesthesiology

## 2012-07-11 ENCOUNTER — Inpatient Hospital Stay (HOSPITAL_COMMUNITY)
Admission: RE | Admit: 2012-07-11 | Discharge: 2012-07-13 | DRG: 473 | Disposition: A | Discharge status: Home / Self Care | Payer: Medicaid Other | Source: Ambulatory Visit | Attending: Neurosurgery | Admitting: Neurosurgery

## 2012-07-11 DIAGNOSIS — I4891 Unspecified atrial fibrillation: Secondary | ICD-10-CM | POA: Diagnosis present

## 2012-07-11 DIAGNOSIS — K5730 Diverticulosis of large intestine without perforation or abscess without bleeding: Secondary | ICD-10-CM | POA: Diagnosis present

## 2012-07-11 DIAGNOSIS — F3289 Other specified depressive episodes: Secondary | ICD-10-CM | POA: Diagnosis present

## 2012-07-11 DIAGNOSIS — M1990 Unspecified osteoarthritis, unspecified site: Secondary | ICD-10-CM | POA: Diagnosis present

## 2012-07-11 DIAGNOSIS — K589 Irritable bowel syndrome without diarrhea: Secondary | ICD-10-CM | POA: Diagnosis present

## 2012-07-11 DIAGNOSIS — E785 Hyperlipidemia, unspecified: Secondary | ICD-10-CM | POA: Diagnosis present

## 2012-07-11 DIAGNOSIS — E119 Type 2 diabetes mellitus without complications: Secondary | ICD-10-CM | POA: Diagnosis present

## 2012-07-11 DIAGNOSIS — IMO0001 Reserved for inherently not codable concepts without codable children: Secondary | ICD-10-CM | POA: Diagnosis present

## 2012-07-11 DIAGNOSIS — G609 Hereditary and idiopathic neuropathy, unspecified: Secondary | ICD-10-CM | POA: Diagnosis present

## 2012-07-11 DIAGNOSIS — J45909 Unspecified asthma, uncomplicated: Secondary | ICD-10-CM | POA: Diagnosis present

## 2012-07-11 DIAGNOSIS — F329 Major depressive disorder, single episode, unspecified: Secondary | ICD-10-CM | POA: Diagnosis present

## 2012-07-11 DIAGNOSIS — M4712 Other spondylosis with myelopathy, cervical region: Principal | ICD-10-CM | POA: Diagnosis present

## 2012-07-11 DIAGNOSIS — I10 Essential (primary) hypertension: Secondary | ICD-10-CM | POA: Diagnosis present

## 2012-07-11 DIAGNOSIS — M4722 Other spondylosis with radiculopathy, cervical region: Secondary | ICD-10-CM

## 2012-07-11 DIAGNOSIS — Z01812 Encounter for preprocedural laboratory examination: Secondary | ICD-10-CM

## 2012-07-11 DIAGNOSIS — I252 Old myocardial infarction: Secondary | ICD-10-CM

## 2012-07-11 DIAGNOSIS — Z794 Long term (current) use of insulin: Secondary | ICD-10-CM

## 2012-07-11 DIAGNOSIS — Z7982 Long term (current) use of aspirin: Secondary | ICD-10-CM

## 2012-07-11 DIAGNOSIS — Z23 Encounter for immunization: Secondary | ICD-10-CM

## 2012-07-11 HISTORY — PX: ANTERIOR CERVICAL DECOMP/DISCECTOMY FUSION: SHX1161

## 2012-07-11 LAB — GLUCOSE, CAPILLARY

## 2012-07-11 SURGERY — ANTERIOR CERVICAL DECOMPRESSION/DISCECTOMY FUSION 1 LEVEL
Anesthesia: General | Wound class: Clean

## 2012-07-11 MED ORDER — POTASSIUM CHLORIDE CRYS ER 10 MEQ PO TBCR
10.0000 meq | EXTENDED_RELEASE_TABLET | Freq: Every day | ORAL | Status: DC
  Administered 2012-07-12 – 2012-07-13 (×2): 10 meq via ORAL
  Filled 2012-07-11 (×3): qty 1

## 2012-07-11 MED ORDER — CARVEDILOL 12.5 MG PO TABS
12.5000 mg | ORAL_TABLET | Freq: Two times a day (BID) | ORAL | Status: DC
  Administered 2012-07-11 – 2012-07-13 (×4): 12.5 mg via ORAL
  Filled 2012-07-11 (×6): qty 1

## 2012-07-11 MED ORDER — INSULIN GLARGINE 100 UNIT/ML ~~LOC~~ SOLN
40.0000 [IU] | Freq: Every day | SUBCUTANEOUS | Status: DC
Start: 2012-07-11 — End: 2012-07-13
  Administered 2012-07-11 – 2012-07-12 (×2): 40 [IU] via SUBCUTANEOUS

## 2012-07-11 MED ORDER — MORPHINE SULFATE 2 MG/ML IJ SOLN: 1.0000 mg | INTRAMUSCULAR | Status: DC | PRN

## 2012-07-11 MED ORDER — DEXTROSE 5 % IV SOLN
INTRAVENOUS | Status: DC | PRN
  Administered 2012-07-11: 10:00:00 via INTRAVENOUS

## 2012-07-11 MED ORDER — BUDESONIDE-FORMOTEROL FUMARATE 80-4.5 MCG/ACT IN AERO
2.0000 | INHALATION_SPRAY | Freq: Two times a day (BID) | RESPIRATORY_TRACT | Status: DC
  Administered 2012-07-12 – 2012-07-13 (×3): 2 via RESPIRATORY_TRACT
  Filled 2012-07-11: qty 6.9

## 2012-07-11 MED ORDER — ALBUTEROL SULFATE HFA 108 (90 BASE) MCG/ACT IN AERS
2.0000 | INHALATION_SPRAY | RESPIRATORY_TRACT | Status: DC | PRN
  Administered 2012-07-13: 2 via RESPIRATORY_TRACT
  Filled 2012-07-11: qty 6.7

## 2012-07-11 MED ORDER — SODIUM CHLORIDE 0.9 % IR SOLN
Status: DC | PRN
  Administered 2012-07-11: 11:00:00

## 2012-07-11 MED ORDER — PROPOFOL 10 MG/ML IV BOLUS
INTRAVENOUS | Status: DC | PRN
  Administered 2012-07-11: 200 mg via INTRAVENOUS

## 2012-07-11 MED ORDER — BACITRACIN ZINC 500 UNIT/GM EX OINT
TOPICAL_OINTMENT | CUTANEOUS | Status: DC | PRN
  Administered 2012-07-11: 1 via TOPICAL

## 2012-07-11 MED ORDER — ROCURONIUM BROMIDE 100 MG/10ML IV SOLN
INTRAVENOUS | Status: DC | PRN
  Administered 2012-07-11: 10 mg via INTRAVENOUS
  Administered 2012-07-11: 40 mg via INTRAVENOUS

## 2012-07-11 MED ORDER — CLONAZEPAM 0.5 MG PO TABS
0.5000 mg | ORAL_TABLET | Freq: Every day | ORAL | Status: DC
  Administered 2012-07-11 – 2012-07-12 (×2): 0.5 mg via ORAL
  Filled 2012-07-11 (×2): qty 1

## 2012-07-11 MED ORDER — BACITRACIN 50000 UNITS IM SOLR
INTRAMUSCULAR | Status: AC
  Filled 2012-07-11: qty 1

## 2012-07-11 MED ORDER — DIPHENHYDRAMINE HCL 25 MG PO CAPS
50.0000 mg | ORAL_CAPSULE | Freq: Four times a day (QID) | ORAL | Status: DC | PRN
  Administered 2012-07-11: 50 mg via ORAL
  Filled 2012-07-11: qty 2

## 2012-07-11 MED ORDER — MORPHINE SULFATE 4 MG/ML IJ SOLN
INTRAMUSCULAR | Status: AC
  Administered 2012-07-11: 2 mg via INTRAVENOUS
  Filled 2012-07-11: qty 1

## 2012-07-11 MED ORDER — LIDOCAINE HCL (CARDIAC) 20 MG/ML IV SOLN
INTRAVENOUS | Status: DC | PRN
  Administered 2012-07-11: 80 mg via INTRAVENOUS

## 2012-07-11 MED ORDER — BUPIVACAINE-EPINEPHRINE PF 0.5-1:200000 % IJ SOLN
INTRAMUSCULAR | Status: DC | PRN
  Administered 2012-07-11: 10 mL

## 2012-07-11 MED ORDER — PROMETHAZINE HCL 25 MG/ML IJ SOLN: 6.2500 mg | INTRAMUSCULAR | Status: DC | PRN

## 2012-07-11 MED ORDER — LACTATED RINGERS IV SOLN
INTRAVENOUS | Status: DC | PRN
  Administered 2012-07-11 (×3): via INTRAVENOUS

## 2012-07-11 MED ORDER — ZOLPIDEM TARTRATE 5 MG PO TABS
5.0000 mg | ORAL_TABLET | Freq: Every evening | ORAL | Status: DC | PRN
  Administered 2012-07-12: 5 mg via ORAL
  Filled 2012-07-11: qty 1

## 2012-07-11 MED ORDER — AZILSARTAN-CHLORTHALIDONE 40-12.5 MG PO TABS
0.5000 | ORAL_TABLET | Freq: Every day | ORAL | Status: DC
  Administered 2012-07-12 – 2012-07-13 (×2): 0.5 via ORAL
  Filled 2012-07-11: qty 1

## 2012-07-11 MED ORDER — NITROGLYCERIN 0.3 MG SL SUBL
0.3000 mg | SUBLINGUAL_TABLET | SUBLINGUAL | Status: DC | PRN
  Filled 2012-07-11: qty 100

## 2012-07-11 MED ORDER — DIAZEPAM 5 MG PO TABS
5.0000 mg | ORAL_TABLET | Freq: Four times a day (QID) | ORAL | Status: DC | PRN
  Administered 2012-07-11 – 2012-07-13 (×5): 5 mg via ORAL
  Filled 2012-07-11 (×5): qty 1

## 2012-07-11 MED ORDER — DEXAMETHASONE SODIUM PHOSPHATE 4 MG/ML IJ SOLN
INTRAMUSCULAR | Status: DC | PRN
  Administered 2012-07-11: 4 mg via INTRAVENOUS

## 2012-07-11 MED ORDER — NEOSTIGMINE METHYLSULFATE 1 MG/ML IJ SOLN
INTRAMUSCULAR | Status: DC | PRN
  Administered 2012-07-11: 3 mg via INTRAVENOUS

## 2012-07-11 MED ORDER — DEXAMETHASONE SODIUM PHOSPHATE 4 MG/ML IJ SOLN: 4.0000 mg | Freq: Four times a day (QID) | INTRAMUSCULAR | Status: AC

## 2012-07-11 MED ORDER — PNEUMOCOCCAL VAC POLYVALENT 25 MCG/0.5ML IJ INJ
0.5000 mL | INJECTION | INTRAMUSCULAR | Status: AC
  Administered 2012-07-12: 0.5 mL via INTRAMUSCULAR
  Filled 2012-07-11: qty 0.5

## 2012-07-11 MED ORDER — INSULIN ASPART 100 UNIT/ML ~~LOC~~ SOLN
0.0000 [IU] | Freq: Three times a day (TID) | SUBCUTANEOUS | Status: DC
  Administered 2012-07-12: 3 [IU] via SUBCUTANEOUS
  Administered 2012-07-12: 7 [IU] via SUBCUTANEOUS
  Administered 2012-07-12: 4 [IU] via SUBCUTANEOUS
  Administered 2012-07-13: 3 [IU] via SUBCUTANEOUS

## 2012-07-11 MED ORDER — PAROXETINE HCL ER 12.5 MG PO TB24
12.5000 mg | ORAL_TABLET | Freq: Every day | ORAL | Status: DC
  Administered 2012-07-11 – 2012-07-12 (×2): 12.5 mg via ORAL
  Filled 2012-07-11 (×3): qty 1

## 2012-07-11 MED ORDER — INSULIN ASPART 100 UNIT/ML ~~LOC~~ SOLN
0.0000 [IU] | SUBCUTANEOUS | Status: DC
  Administered 2012-07-11: 3 [IU] via SUBCUTANEOUS

## 2012-07-11 MED ORDER — GLYCOPYRROLATE 0.2 MG/ML IJ SOLN
INTRAMUSCULAR | Status: DC | PRN
  Administered 2012-07-11: 0.4 mg via INTRAVENOUS

## 2012-07-11 MED ORDER — DOFETILIDE 250 MCG PO CAPS
250.0000 ug | ORAL_CAPSULE | Freq: Two times a day (BID) | ORAL | Status: DC
  Administered 2012-07-11 – 2012-07-13 (×4): 250 ug via ORAL
  Filled 2012-07-11 (×5): qty 1

## 2012-07-11 MED ORDER — HYDROCODONE-ACETAMINOPHEN 5-325 MG PO TABS
1.0000 | ORAL_TABLET | ORAL | Status: DC | PRN
  Administered 2012-07-12 – 2012-07-13 (×4): 2 via ORAL
  Filled 2012-07-11 (×4): qty 2

## 2012-07-11 MED ORDER — DOCUSATE SODIUM 100 MG PO CAPS
100.0000 mg | ORAL_CAPSULE | Freq: Two times a day (BID) | ORAL | Status: DC
  Administered 2012-07-11 – 2012-07-13 (×5): 100 mg via ORAL
  Filled 2012-07-11 (×5): qty 1

## 2012-07-11 MED ORDER — PANTOPRAZOLE SODIUM 40 MG PO TBEC
80.0000 mg | DELAYED_RELEASE_TABLET | Freq: Every day | ORAL | Status: DC
  Administered 2012-07-12: 80 mg via ORAL
  Filled 2012-07-11: qty 2

## 2012-07-11 MED ORDER — NITROGLYCERIN 0.4 MG SL SUBL: 0.4000 mg | SUBLINGUAL_TABLET | SUBLINGUAL | Status: DC | PRN

## 2012-07-11 MED ORDER — THROMBIN 5000 UNITS EX SOLR
CUTANEOUS | Status: DC | PRN
  Administered 2012-07-11 (×2): 5000 [IU] via TOPICAL

## 2012-07-11 MED ORDER — METFORMIN HCL 500 MG PO TABS
500.0000 mg | ORAL_TABLET | Freq: Two times a day (BID) | ORAL | Status: DC
  Administered 2012-07-11 – 2012-07-13 (×4): 500 mg via ORAL
  Filled 2012-07-11 (×6): qty 1

## 2012-07-11 MED ORDER — MORPHINE SULFATE 2 MG/ML IJ SOLN
1.0000 mg | INTRAMUSCULAR | Status: DC | PRN
  Administered 2012-07-11 – 2012-07-12 (×4): 2 mg via INTRAVENOUS
  Filled 2012-07-11 (×4): qty 1

## 2012-07-11 MED ORDER — MENTHOL 3 MG MT LOZG
1.0000 | LOZENGE | OROMUCOSAL | Status: DC | PRN
  Filled 2012-07-11 (×2): qty 9

## 2012-07-11 MED ORDER — FERROUS SULFATE 325 (65 FE) MG PO TABS
325.0000 mg | ORAL_TABLET | Freq: Every day | ORAL | Status: DC
  Administered 2012-07-11 – 2012-07-13 (×3): 325 mg via ORAL
  Filled 2012-07-11 (×3): qty 1

## 2012-07-11 MED ORDER — FENTANYL CITRATE 0.05 MG/ML IJ SOLN
INTRAMUSCULAR | Status: DC | PRN
  Administered 2012-07-11: 100 ug via INTRAVENOUS

## 2012-07-11 MED ORDER — WHITE PETROLATUM GEL
Status: AC
  Filled 2012-07-11: qty 5

## 2012-07-11 MED ORDER — FLUTICASONE PROPIONATE 50 MCG/ACT NA SUSP
2.0000 | Freq: Every day | NASAL | Status: DC
  Administered 2012-07-12 – 2012-07-13 (×2): 2 via NASAL
  Filled 2012-07-11: qty 16

## 2012-07-11 MED ORDER — PHENOL 1.4 % MT LIQD: 1.0000 | OROMUCOSAL | Status: DC | PRN

## 2012-07-11 MED ORDER — MORPHINE SULFATE 2 MG/ML IJ SOLN
1.0000 mg | INTRAMUSCULAR | Status: DC | PRN
  Administered 2012-07-11 (×2): 2 mg via INTRAVENOUS

## 2012-07-11 MED ORDER — ONDANSETRON HCL 4 MG/2ML IJ SOLN
INTRAMUSCULAR | Status: DC | PRN
  Administered 2012-07-11: 4 mg via INTRAVENOUS

## 2012-07-11 MED ORDER — DEXAMETHASONE 4 MG PO TABS
4.0000 mg | ORAL_TABLET | Freq: Four times a day (QID) | ORAL | Status: AC
  Administered 2012-07-11 (×2): 4 mg via ORAL
  Filled 2012-07-11 (×3): qty 1

## 2012-07-11 MED ORDER — LIRAGLUTIDE 18 MG/3ML ~~LOC~~ SOLN
1.8000 mg | Freq: Every day | SUBCUTANEOUS | Status: DC
  Administered 2012-07-11 – 2012-07-13 (×3): 1.8 mg via SUBCUTANEOUS
  Filled 2012-07-11 (×5): qty 0.3

## 2012-07-11 MED ORDER — AZILSARTAN-CHLORTHALIDONE 40-12.5 MG PO TABS: 0.5000 | ORAL_TABLET | Freq: Every day | ORAL | Status: DC

## 2012-07-11 MED ORDER — OXYCODONE-ACETAMINOPHEN 5-325 MG PO TABS
1.0000 | ORAL_TABLET | ORAL | Status: DC | PRN
  Administered 2012-07-11 – 2012-07-13 (×5): 2 via ORAL
  Filled 2012-07-11 (×5): qty 2

## 2012-07-11 MED ORDER — LORATADINE 10 MG PO TABS
10.0000 mg | ORAL_TABLET | Freq: Every morning | ORAL | Status: DC
  Administered 2012-07-12 – 2012-07-13 (×2): 10 mg via ORAL
  Filled 2012-07-11 (×2): qty 1

## 2012-07-11 MED ORDER — ARTIFICIAL TEARS OP OINT
TOPICAL_OINTMENT | OPHTHALMIC | Status: DC | PRN
  Administered 2012-07-11: 1 via OPHTHALMIC

## 2012-07-11 MED ORDER — LACTATED RINGERS IV SOLN: INTRAVENOUS | Status: DC

## 2012-07-11 MED ORDER — ONDANSETRON HCL 4 MG/2ML IJ SOLN
4.0000 mg | INTRAMUSCULAR | Status: DC | PRN
  Administered 2012-07-11 – 2012-07-13 (×3): 4 mg via INTRAVENOUS
  Filled 2012-07-11 (×3): qty 2

## 2012-07-11 MED ORDER — SODIUM CHLORIDE 0.9 % IV SOLN
INTRAVENOUS | Status: AC
  Filled 2012-07-11: qty 500

## 2012-07-11 MED ORDER — CEFAZOLIN SODIUM-DEXTROSE 2-3 GM-% IV SOLR
2.0000 g | Freq: Three times a day (TID) | INTRAVENOUS | Status: AC
  Administered 2012-07-11 – 2012-07-12 (×2): 2 g via INTRAVENOUS
  Filled 2012-07-11 (×3): qty 50

## 2012-07-11 MED ORDER — ACETAMINOPHEN 650 MG RE SUPP: 650.0000 mg | RECTAL | Status: DC | PRN

## 2012-07-11 MED ORDER — HEMOSTATIC AGENTS (NO CHARGE) OPTIME
TOPICAL | Status: DC | PRN
  Administered 2012-07-11: 1 via TOPICAL

## 2012-07-11 MED ORDER — ATORVASTATIN CALCIUM 20 MG PO TABS
20.0000 mg | ORAL_TABLET | Freq: Every day | ORAL | Status: DC
  Administered 2012-07-11 – 2012-07-12 (×2): 20 mg via ORAL
  Filled 2012-07-11 (×3): qty 1

## 2012-07-11 MED ORDER — 0.9 % SODIUM CHLORIDE (POUR BTL) OPTIME
TOPICAL | Status: DC | PRN
  Administered 2012-07-11: 1000 mL

## 2012-07-11 MED ORDER — ACETAMINOPHEN 325 MG PO TABS: 650.0000 mg | ORAL_TABLET | ORAL | Status: DC | PRN

## 2012-07-11 MED ORDER — MORPHINE SULFATE 4 MG/ML IJ SOLN: 4.0000 mg | INTRAMUSCULAR | Status: DC | PRN

## 2012-07-11 MED ORDER — NIACIN ER (ANTIHYPERLIPIDEMIC) 500 MG PO TBCR
500.0000 mg | EXTENDED_RELEASE_TABLET | Freq: Every day | ORAL | Status: DC
  Administered 2012-07-11 – 2012-07-12 (×2): 500 mg via ORAL
  Filled 2012-07-11 (×3): qty 1

## 2012-07-11 MED ORDER — MIDAZOLAM HCL 5 MG/5ML IJ SOLN
INTRAMUSCULAR | Status: DC | PRN
  Administered 2012-07-11: 2 mg via INTRAVENOUS

## 2012-07-11 SURGICAL SUPPLY — 60 items
APL SKNCLS STERI-STRIP NONHPOA (GAUZE/BANDAGES/DRESSINGS) ×1
BAG DECANTER FOR FLEXI CONT (MISCELLANEOUS) ×2 IMPLANT
BENZOIN TINCTURE PRP APPL 2/3 (GAUZE/BANDAGES/DRESSINGS) ×2 IMPLANT
BIT DRILL SPINE QC 12 (BIT) ×1 IMPLANT
BLADE SURG 15 STRL LF DISP TIS (BLADE) ×1 IMPLANT
BLADE SURG 15 STRL SS (BLADE)
BLADE ULTRA TIP 2M (BLADE) ×2 IMPLANT
BRUSH SCRUB EZ PLAIN DRY (MISCELLANEOUS) ×2 IMPLANT
BUR BARREL STRAIGHT FLUTE 4.0 (BURR) ×2 IMPLANT
BUR MATCHSTICK NEURO 3.0 LAGG (BURR) ×3 IMPLANT
CANISTER SUCTION 2500CC (MISCELLANEOUS) ×2 IMPLANT
CLOTH BEACON ORANGE TIMEOUT ST (SAFETY) ×2 IMPLANT
CONT SPEC 4OZ CLIKSEAL STRL BL (MISCELLANEOUS) ×2 IMPLANT
COVER MAYO STAND STRL (DRAPES) ×2 IMPLANT
DRAPE LAPAROTOMY 100X72 PEDS (DRAPES) ×2 IMPLANT
DRAPE MICROSCOPE LEICA (MISCELLANEOUS) IMPLANT
DRAPE POUCH INSTRU U-SHP 10X18 (DRAPES) ×2 IMPLANT
DRAPE SURG 17X23 STRL (DRAPES) ×4 IMPLANT
ELECT REM PT RETURN 9FT ADLT (ELECTROSURGICAL) ×2
ELECTRODE REM PT RTRN 9FT ADLT (ELECTROSURGICAL) ×1 IMPLANT
GAUZE SPONGE 4X4 16PLY XRAY LF (GAUZE/BANDAGES/DRESSINGS) IMPLANT
GLOVE BIO SURGEON STRL SZ8.5 (GLOVE) ×2 IMPLANT
GLOVE BIOGEL PI IND STRL 7.5 (GLOVE) IMPLANT
GLOVE BIOGEL PI INDICATOR 7.5 (GLOVE) ×1
GLOVE ECLIPSE 7.5 STRL STRAW (GLOVE) ×2 IMPLANT
GLOVE EXAM NITRILE LRG STRL (GLOVE) IMPLANT
GLOVE EXAM NITRILE MD LF STRL (GLOVE) ×1 IMPLANT
GLOVE EXAM NITRILE XL STR (GLOVE) IMPLANT
GLOVE EXAM NITRILE XS STR PU (GLOVE) IMPLANT
GLOVE SS BIOGEL STRL SZ 8 (GLOVE) ×1 IMPLANT
GLOVE SUPERSENSE BIOGEL SZ 8 (GLOVE) ×1
GOWN BRE IMP SLV AUR LG STRL (GOWN DISPOSABLE) ×1 IMPLANT
GOWN BRE IMP SLV AUR XL STRL (GOWN DISPOSABLE) ×3 IMPLANT
GOWN STRL REIN 2XL LVL4 (GOWN DISPOSABLE) ×1 IMPLANT
KIT BASIN OR (CUSTOM PROCEDURE TRAY) ×2 IMPLANT
KIT ROOM TURNOVER OR (KITS) ×2 IMPLANT
MARKER SKIN DUAL TIP RULER LAB (MISCELLANEOUS) ×3 IMPLANT
NDL SPNL 18GX3.5 QUINCKE PK (NEEDLE) ×1 IMPLANT
NEEDLE HYPO 22GX1.5 SAFETY (NEEDLE) ×2 IMPLANT
NEEDLE SPNL 18GX3.5 QUINCKE PK (NEEDLE) ×2 IMPLANT
NS IRRIG 1000ML POUR BTL (IV SOLUTION) ×2 IMPLANT
PACK LAMINECTOMY NEURO (CUSTOM PROCEDURE TRAY) ×2 IMPLANT
PEEK VISTA 14X14X7MM (Peek) ×1 IMPLANT
PIN DISTRACTION 14MM (PIN) ×4 IMPLANT
PLATE ANT CERV XTEND 1 LV 14 (Plate) ×1 IMPLANT
PUTTY ABX ACTIFUSE 1.5ML (Putty) ×1 IMPLANT
RUBBERBAND STERILE (MISCELLANEOUS) IMPLANT
SCREW XTD VAR 4.2 SELF TAP 12 (Screw) ×4 IMPLANT
SPONGE GAUZE 4X4 12PLY (GAUZE/BANDAGES/DRESSINGS) ×2 IMPLANT
SPONGE INTESTINAL PEANUT (DISPOSABLE) ×3 IMPLANT
SPONGE SURGIFOAM ABS GEL SZ50 (HEMOSTASIS) ×2 IMPLANT
STRIP CLOSURE SKIN 1/2X4 (GAUZE/BANDAGES/DRESSINGS) ×2 IMPLANT
SUT VIC AB 0 CT1 27 (SUTURE) ×2
SUT VIC AB 0 CT1 27XBRD ANTBC (SUTURE) ×1 IMPLANT
SUT VIC AB 3-0 SH 8-18 (SUTURE) ×2 IMPLANT
SYR 20ML ECCENTRIC (SYRINGE) ×2 IMPLANT
TAPE CLOTH SURG 4X10 WHT LF (GAUZE/BANDAGES/DRESSINGS) ×1 IMPLANT
TOWEL OR 17X24 6PK STRL BLUE (TOWEL DISPOSABLE) ×2 IMPLANT
TOWEL OR 17X26 10 PK STRL BLUE (TOWEL DISPOSABLE) ×2 IMPLANT
WATER STERILE IRR 1000ML POUR (IV SOLUTION) ×2 IMPLANT

## 2012-07-11 NOTE — Progress Notes (Signed)
Subjective:  The patient is alert and pleasant. She looks well. She is in no apparent distress.  Objective: Vital signs in last 24 hours: Temp:  [97.8 F (36.6 C)-98.6 F (37 C)] 98.6 F (37 C) (11/25 1227) Pulse Rate:  [75-86] 86  (11/25 1227) Resp:  [20-28] 28  (11/25 1227) BP: (106-113)/(44-75) 106/44 mmHg (11/25 1227) SpO2:  [98 %-99 %] 98 % (11/25 1227)  Intake/Output from previous day:   Intake/Output this shift: Total I/O In: 2200 [I.V.:2200] Out: 20 [Blood:20]  Physical exam the patient is alert and pleasant. She is moving all 4 extremities well. Her dressing is clean and dry without evidence of hematoma or shift.  Lab Results: No results found for this basename: WBC:2,HGB:2,HCT:2,PLT:2 in the last 72 hours BMET No results found for this basename: NA:2,K:2,CL:2,CO2:2,GLUCOSE:2,BUN:2,CREATININE:2,CALCIUM:2 in the last 72 hours  Studies/Results: Dg Cervical Spine 2-3 Views  07/11/2012  *RADIOLOGY REPORT*  Clinical Data: Cervical disc disease.  CERVICAL SPINE - 2-3 VIEW  Comparison: MRI dated 04/30/2012  Findings: Radiograph #1 demonstrates a localization needle at C4-5. Radiograph #2 demonstrates the patient has undergone anterior cervical fusion at C5-6.  The fused level is partially obscured by the patient's shoulders.  IMPRESSION: Anterior cervical fusion performed at C5-6.   Original Report Authenticated By: Francene Boyers, M.D.     Assessment/Plan: The patient is doing well.  LOS: 0 days     Darly Fails D 07/11/2012, 12:40 PM

## 2012-07-11 NOTE — Anesthesia Procedure Notes (Signed)
Procedure Name: Intubation Date/Time: 07/11/2012 10:20 AM Performed by: Tyrone Nine Pre-anesthesia Checklist: Patient identified, Timeout performed, Emergency Drugs available, Suction available and Patient being monitored Patient Re-evaluated:Patient Re-evaluated prior to inductionOxygen Delivery Method: Circle system utilized Preoxygenation: Pre-oxygenation with 100% oxygen Intubation Type: IV induction Ventilation: Mask ventilation without difficulty Laryngoscope Size: Mac and 3 Grade View: Grade I Tube type: Oral Tube size: 7.5 mm Number of attempts: 1 Airway Equipment and Method: Stylet Placement Confirmation: ETT inserted through vocal cords under direct vision,  breath sounds checked- equal and bilateral and positive ETCO2 Secured at: 21 cm Tube secured with: Tape Dental Injury: Teeth and Oropharynx as per pre-operative assessment

## 2012-07-11 NOTE — Transfer of Care (Signed)
Immediate Anesthesia Transfer of Care Note  Patient: [name]  Procedure(s) Performed: Procedure(s) (LRB) with comments: ANTERIOR CERVICAL DECOMPRESSION/DISCECTOMY FUSION 1 LEVEL (N/A) - Cervical Five-Six Anterior Cervical Decompression with Fusion Interbody Prothesis Plating and Bonegraft  Patient Location: PACU  Anesthesia Type:General  Level of Consciousness: awake, alert , oriented and patient cooperative  Airway & Oxygen Therapy: Patient Spontanous Breathing and Patient connected to nasal cannula oxygen  Post-op Assessment: Report given to PACU RN and Post -op Vital signs reviewed and stable  Post vital signs: Reviewed and stable  Complications: No apparent anesthesia complications

## 2012-07-11 NOTE — H&P (Signed)
Subjective: The patient is a 61 year old black female who complains of neck and bilateral arm pain. She has failed medical management and was worked up with a cervical MRI. This demonstrates spondylosis and stenosis at C5-6. I discussed the various treatment option with the patient including surgery. She has weighed the risks, benefits, and alternative surgery and decided proceed with a C5-6 anterior cervicectomy fusion and plating.   Past Medical History  Diagnosis Date  . Diabetes mellitus     type 2  . Hypertension   . Hyperlipidemia   . LVH (left ventricular hypertrophy)   . Arthritis   . Colon polyps   . Diverticulosis   . Gallstones   . IBS (irritable bowel syndrome)   . Kidney stones   . Myocardial infarction   . Anginal pain     OCT 2013  . Paroxysmal a-fib     coumadin ( Chads2=2) Echo 09/05/10 EF 55-60%; mod LVH  . Depression   . 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 Sarcoidosis of lung     NONACTIVE      SEES DR ZOXW  . Seizures     YEARS AGO NONE SINCE TEENS  . Chronic headaches   . Fibromyalgia   . Peripheral neuropathy     Past Surgical History  Procedure Date  . Left ankle fracture     closed reduction March 2008  . Wrist arthroscopy     June 2003  . Carpal tunnel release 2003    bilateral  . Subcutaneous transposition of left ulnar nerve   . Abdominal hysterectomy   . Cholecystectomy   . Elbow surgery     bilateral  . Esophageal manometry 03/21/2012    Procedure: ESOPHAGEAL MANOMETRY (EM);  Surgeon: [name], MD;  Location: [facility];  Service: Endoscopy;  Laterality: N/A;  . Colon surgery     Allergies  Allergen Reactions  . Amitriptyline Other (See Comments)    Disoriented.  . Hydromorphone Hcl Other (See Comments)    blood pressure drops.  . Prednisone     SENT INTO [facility]     History  Substance Use Topics  . Smoking status: Never Smoker   . Smokeless tobacco: Never Used  . Alcohol Use: No    Family History  Problem Relation Age of Onset  .  Heart attack Mother     @ age 15  . Heart attack Brother     @ age 37  . Colon cancer Maternal Aunt   . Mental illness Mother   . Prostate cancer Maternal Grandfather   . Ovarian cancer Maternal Aunt     and cousin  . Diabetes Mother   . Diabetes      siblings  . Hypertension Mother     siblings  . Lupus Sister    Prior to Admission medications   Medication Sig Start Date End Date Taking? Authorizing Provider  albuterol (PROVENTIL HFA;VENTOLIN HFA) 108 (90 BASE) MCG/ACT inhaler Inhale 2 puffs into the lungs every 4 (four) hours as needed for wheezing or shortness of breath. 10/19/11  Yes Tammy S Parrett, NP  aspirin EC 81 MG tablet Take 81 mg by mouth daily.   Yes Historical Provider, MD  Azilsartan-Chlorthalidone 40-12.5 MG TABS Take 0.5 tablets by mouth daily. 06/29/12  Yes Dyann Kief, PA  budesonide-formoterol (SYMBICORT) 80-4.5 MCG/ACT inhaler Inhale 2 puffs into the lungs 2 (two) times daily.   Yes Historical Provider, MD  calcium carbonate (OS-CAL) 600 MG  TABS Take 1 tablet (600 mg total) by mouth daily. 10/19/11  Yes Tammy S Parrett, NP  carvedilol (COREG) 12.5 MG tablet Take 1 tablet (12.5 mg total) by mouth 2 (two) times daily. 06/23/12  Yes Wendall Stade, MD  cholecalciferol (VITAMIN D) 1000 UNITS tablet Take 1,000 Units by mouth 2 (two) times daily.    Yes Historical Provider, MD  clonazePAM (KLONOPIN) 0.5 MG tablet Take 0.5 mg by mouth at bedtime.   Yes Historical Provider, MD  Cranberry 500 MG CAPS Take 500 mg by mouth 2 (two) times daily.   Yes Historical Provider, MD  dofetilide ([name]) 250 MCG capsule Take 1 capsule (250 mcg total) by mouth 2 (two) times daily. 02/15/12  Yes [name], MD  esomeprazole (NEXIUM) 40 MG capsule Take 40 mg by mouth 2 (two) times daily.   Yes Historical Provider, MD  ferrous sulfate 325 (65 FE) MG tablet Take 1 tablet (325 mg total) by mouth daily. 10/19/11 10/18/12 Yes Tammy S Parrett, NP  insulin glargine (LANTUS) 100 UNIT/ML injection  Inject 40 Units into the skin at bedtime. 10/19/11  Yes Tammy S Parrett, NP  Liraglutide (VICTOZA) 18 MG/3ML SOLN Inject 1.8 mg into the skin daily.   Yes Historical Provider, MD  loratadine (CLARITIN) 10 MG tablet Take 10 mg by mouth every morning.   Yes Historical Provider, MD  metFORMIN (GLUCOPHAGE) 500 MG tablet Take 500 mg by mouth 2 (two) times daily with a meal.   Yes Historical Provider, MD  niacin (NIASPAN) 500 MG CR tablet Take 500 mg by mouth at bedtime.    Yes Historical Provider, MD  PARoxetine (PAXIL-CR) 12.5 MG 24 hr tablet Take 12.5 mg by mouth at bedtime.    Yes Historical Provider, MD  potassium chloride (K-DUR,KLOR-CON) 10 MEQ tablet Take 1 tablet (10 mEq total) by mouth daily. 12/17/11 12/16/12 Yes Wendall Stade, MD  rizatriptan (MAXALT) 10 MG tablet Take 10 mg by mouth as needed. For migraines. May repeat in 2 hours if needed.   Yes Historical Provider, MD  rosuvastatin (CRESTOR) 10 MG tablet Take 10 mg by mouth at bedtime.    Yes Historical Provider, MD  traMADol (ULTRAM) 50 MG tablet Take 100 mg by mouth 2 (two) times daily as needed. For pain.   Yes Historical Provider, MD  triamcinolone (NASACORT) 55 MCG/ACT nasal inhaler Place 2 sprays into the nose 2 (two) times daily.    Yes Historical Provider, MD  vitamin C (ASCORBIC ACID) 500 MG tablet Take 1 tablet (500 mg total) by mouth daily. 10/19/11 10/18/12 Yes Tammy S Parrett, NP  NITROSTAT 0.4 MG SL tablet place 1 tablet under the tongue if needed every 5 minutes for chest pain for 3 doses IF NO RELIEF AFTER 3RD DOSE CALL PRESCRIBER OR 911. 06/02/12   Wendall Stade, MD     Review of Systems  Positive ROS: As above  All other systems have been reviewed and were otherwise negative with the exception of those mentioned in the HPI and as above.  Objective: Vital signs in last 24 hours: Temp:  [97.8 F (36.6 C)] 97.8 F (36.6 C) (11/25 0708) Pulse Rate:  [75] 75  (11/25 0708) Resp:  [20] 20  (11/25 0708) BP: (113)/(75) 113/75  mmHg (11/25 0708) SpO2:  [99 %] 99 % (11/25 0708)  General Appearance: Alert, cooperative, no distress, appears stated age Head: Normocephalic, without obvious abnormality, atraumatic Eyes: PERRL, conjunctiva/corneas clear, EOM's intact, fundi benign, both eyes  Ears: Normal TM's and external ear canals, both ears Throat: Lips, mucosa, and tongue normal; teeth and gums normal Neck: Supple, symmetrical, trachea midline, no adenopathy; thyroid: No enlargement/tenderness/nodules; no carotid bruit or JVD Back: Symmetric, no curvature, ROM normal, no CVA tenderness Lungs: Clear to auscultation bilaterally, respirations unlabored Heart: Regular rate and rhythm, S1 and S2 normal, no murmur, rub or gallop Abdomen: Soft, non-tender, bowel sounds active all four quadrants, no masses, no organomegaly Extremities: Extremities normal, atraumatic, no cyanosis or edema Pulses: 2+ and symmetric all extremities Skin: Skin color, texture, turgor normal, no rashes or lesions  NEUROLOGIC:   Mental status: alert and oriented, no aphasia, good attention span, Fund of knowledge/ memory ok Motor Exam - grossly normal Sensory Exam - grossly normal Reflexes: Symmetric Coordination - grossly normal Gait - grossly normal Balance - grossly normal Cranial Nerves: I: smell Not tested  II: visual acuity  OS: Normal    OD: Normal   II: visual fields Full to confrontation  II: pupils Equal, round, reactive to light  III,VII: ptosis None  III,IV,VI: extraocular muscles  Full ROM  V: mastication Normal  V: facial light touch sensation  Normal  V,VII: corneal reflex  Present  VII: facial muscle function - upper  Normal  VII: facial muscle function - lower Normal  VIII: hearing Not tested  IX: soft palate elevation  Normal  IX,X: gag reflex Present  XI: trapezius strength  5/5  XI: sternocleidomastoid strength 5/5  XI: neck flexion strength  5/5  XII: tongue strength  Normal    Data Review Lab Results    Component Value Date   WBC 5.0 07/07/2012   HGB 11.1* 07/07/2012   HCT 33.6* 07/07/2012   MCV 88.2 07/07/2012   PLT 210 07/07/2012   Lab Results  Component Value Date   NA 133* 07/07/2012   K 3.6 07/07/2012   CL 98 07/07/2012   CO2 23 07/07/2012   BUN 12 07/07/2012   CREATININE 1.19* 07/07/2012   GLUCOSE 135* 07/07/2012   Lab Results  Component Value Date   INR 1.96* 05/19/2011    Assessment/Plan: 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 I have reviewed her MR scan with her and pointed out the abnormalities. We have discussed the various treatment options including surgery. I described the surgical option of a C5-6 anterior cervicectomy interbody arthrodesis prosthesis and intracervical plating. I've shown her surgical models. We have discussed the risks, benefits, alternatives, and likelihood of achieving our goals with surgery. I've answered all the patient questions. She wants to proceed with surgery.   Vidhi Delellis D 07/11/2012 10:11 AM

## 2012-07-11 NOTE — Anesthesia Postprocedure Evaluation (Signed)
Anesthesia Post Note  Patient: [name]  Procedure(s) Performed: Procedure(s) (LRB): ANTERIOR CERVICAL DECOMPRESSION/DISCECTOMY FUSION 1 LEVEL (N/A)  Anesthesia type: general  Patient location: PACU  Post pain: Pain level controlled  Post assessment: Patient's Cardiovascular Status Stable  Last Vitals:  Filed Vitals:   07/11/12 1330  BP: 107/71  Pulse: 81  Temp:   Resp: 15    Post vital signs: Reviewed and stable  Level of consciousness: sedated  Complications: No apparent anesthesia complications

## 2012-07-11 NOTE — Progress Notes (Signed)
Orthopedic Tech Progress Note Patient Details:  [name] [birth year] [id number]  Patient ID: [name], female   DOB: Sep 30, 1950, 61 y.o.   MRN: [id number]   Shawnie Pons 07/11/2012, 9:34 AM [employer] [employer] COMPLETED BY [employer].

## 2012-07-11 NOTE — Progress Notes (Signed)
Utilization Review Completed.[name] T11/25/2013   

## 2012-07-11 NOTE — Progress Notes (Signed)
All documentation and care given prior to this note done by Jonna Munro RN

## 2012-07-11 NOTE — Op Note (Signed)
Brief history: The patient is a 61 year old black female who is complaining of neck and bilateral shoulder and arm pain consistent with a cervical radiculopathy. She has failed medical management and was worked up with cervical MRI. This demonstrated this degeneration, spondylosis, stenosis etc. at C5-6. I discussed the various treatment option with the patient including surgery. She has weighed the risks, benefits, and alternatives surgery and decided proceed with a C5-6 anterior cervical discectomy, fusion, and plating.  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 Delma Officer  Asst.: Dr. Altamease Oiler  Anesthesia: Gen. endotracheal  Estimated blood loss: 50 cc  Drains: None  Complications: None  Description of procedure: The patient was brought to the operating room by the anesthesia team. General endotracheal anesthesia was induced. A roll was placed under the patient's shoulders to keep the neck in the neutral position. The patient's anterior cervical region was then prepared with Betadine scrub and Betadine solution. Sterile drapes were applied.  The area to be incised was then injected with Marcaine with epinephrine solution. I then used a scalpel to make a transverse incision in the patient's left anterior neck. I used the Metzenbaum scissors to divide the platysmal muscle and then to dissect medial to the sternocleidomastoid muscle, jugular vein, and carotid artery. I carefully dissected down towards the anterior cervical spine identifying the esophagus and retracting it  medially. Then using Kitner swabs to clear soft tissue from the anterior cervical spine. We then inserted a bent spinal needle into the upper exposed intervertebral disc space. We then obtained intraoperative radiographs confirm our location.  I then used electrocautery to detach the medial border of the longus colli muscle bilaterally from the C5-6 intervertebral disc spaces. I then inserted the Caspar self-retaining retractor underneath the longus colli muscle bilaterally to provide exposure.  We then incised the intervertebral disc at C5-6. We then performed a partial intervertebral discectomy with a pituitary forceps and the Karlin curettes. I then inserted distraction screws into the vertebral bodies at C5 and C6. We then distracted the interspace. We then used the high-speed drill to decorticate the vertebral endplates at C5-6, to drill away the remainder of the intervertebral disc, to drill away some posterior spondylosis, and to thin out the posterior longitudinal ligament. I then incised ligament with the arachnoid knife. We then removed the ligament with a Kerrison punches undercutting the vertebral endplates and decompressing the thecal sac. We then performed foraminotomies about the bilateral C6 nerve roots. This completed the decompression at this level.   We now turned our to attention to the interbody fusion. We used the trial spacers to determine the appropriate size for the interbody prosthesis. We then pre-filled prosthesis with a combination of local morcellized autograft bone that we obtained during decompression as well as Actifuse bone graft extender. We then inserted the prosthesis into the distracted interspace at C5-6. We then removed the distraction screws. There was a good snug fit of the prosthesis in the interspace.   Having completed the fusion we now turned attention to the anterior spinal instrumentation. We used the high-speed drill to drill away some anterior spondylosis  at the disc spaces so that the plate lay down flat. We selected the appropriate length  titanium anterior cervical plate. We laid it along the anterior aspect of the vertebral bodies from C5-C6. We then drilled 12 mm holes at C5 and C6. We then secured the plate to the vertebral bodies by placing two 12 mm self-tapping screws at C5 and C6. We then obtained intraoperative radiograph. The demonstrating good position of the instrumentation. We therefore secured the screws the plate the locking each cam. This completed the instrumentation.  We then obtained hemostasis using bipolar electrocautery. We irrigated the wound out with bacitracin solution. We then removed the retractor. We inspected the esophagus for any damage. There was none apparent. We then reapproximated patient's platysmal muscle with interrupted 3-0 Vicryl suture. We then reapproximated the subcutaneous tissue with interrupted 3-0 Vicryl suture. The skin was reapproximated with Steri-Strips and benzoin. The wound was then covered with bacitracin ointment. A sterile dressing was applied. The drapes were removed. Patient was subsequently extubated by the anesthesia team and transported to the post anesthesia care unit in stable condition. All sponge instrument and needle counts were reportedly correct at the end of this case.

## 2012-07-11 NOTE — Preoperative (Signed)
Beta Blockers   Coreg 12.5 mg @ 05:30

## 2012-07-11 NOTE — Progress Notes (Signed)
Subjective:  The patient is alert and pleasant. She looks well. She has no complaints.  Objective: Vital signs in last 24 hours: Temp:  [97.2 F (36.2 C)-98.6 F (37 C)] 97.2 F (36.2 C) (11/25 1430) Pulse Rate:  [72-89] 89  (11/25 1400) Resp:  [14-28] 14  (11/25 1400) BP: (106-113)/(44-75) 107/71 mmHg (11/25 1330) SpO2:  [98 %-99 %] 99 % (11/25 1330)  Intake/Output from previous day:   Intake/Output this shift: Total I/O In: 2200 [I.V.:2200] Out: 20 [Blood:20]  Physical exam the patient is alert and oriented. She is moving all 4 extremities well. Her dressing is clean and dry, soft and flat. There is no evidence of hematoma or shift.  Lab Results: No results found for this basename: WBC:2,HGB:2,HCT:2,PLT:2 in the last 72 hours BMET No results found for this basename: NA:2,K:2,CL:2,CO2:2,GLUCOSE:2,BUN:2,CREATININE:2,CALCIUM:2 in the last 72 hours  Studies/Results: Dg Cervical Spine 2-3 Views  07/11/2012  *RADIOLOGY REPORT*  Clinical Data: Cervical disc disease.  CERVICAL SPINE - 2-3 VIEW  Comparison: MRI dated 04/30/2012  Findings: Radiograph #1 demonstrates a localization needle at C4-5. Radiograph #2 demonstrates the patient has undergone anterior cervical fusion at C5-6.  The fused level is partially obscured by the patient's shoulders.  IMPRESSION: Anterior cervical fusion performed at C5-6.   Original Report Authenticated By: Francene Boyers, M.D.     Assessment/Plan: The patient is doing well and we'll likely go home tomorrow.  LOS: 0 days     Mathew Storck D 07/11/2012, 3:24 PM

## 2012-07-11 NOTE — Anesthesia Preprocedure Evaluation (Addendum)
Anesthesia Evaluation  Patient identified by MRN, date of birth, ID band Patient awake    Reviewed: Allergy & Precautions, H&P , NPO status , Patient's Chart, lab work & pertinent test results  Airway Mallampati: II TM Distance: >3 FB Neck ROM: Full    Dental  (+) Teeth Intact, Dental Advisory Given and Partial Upper,    Pulmonary asthma ,  Uses inhaler daily.   Pulmonary exam normal       Cardiovascular hypertension, Pt. on medications + angina with exertion + Past MI + dysrhythmias Atrial Fibrillation Rhythm:Regular Rate:Normal  Normal cath 12/2010 ---------------------------------------------------------------   LEFT VENTRICLE:  -  Left ventricular size was normal.  -  Overall left ventricular systolic function was normal.  -  Left ventricular ejection fraction was estimated , range being 60        % to 65 %.  -  There were no left ventricular regional wall motion        abnormalities.  -  Left ventricular wall thickness was normal.     Neuro/Psych  Headaches, Seizures -, Well Controlled,  PSYCHIATRIC DISORDERS Depression  Neuromuscular disease    GI/Hepatic Neg liver ROS, GERD-  Medicated and Controlled,  Endo/Other  diabetes, Well Controlled, Type 2, Insulin DependentGlucose 138  Renal/GU Renal diseasenegative Renal ROS     Musculoskeletal  (+) Arthritis -, Osteoarthritis,  Fibromyalgia -, narcotic dependent  Abdominal   Peds  Hematology   Anesthesia Other Findings   Reproductive/Obstetrics                       Anesthesia Physical Anesthesia Plan  ASA: III  Anesthesia Plan: General   Post-op Pain Management:    Induction: Intravenous  Airway Management Planned: Oral ETT  Additional Equipment:   Intra-op Plan:   Post-operative Plan: Extubation in OR  Informed Consent: I have reviewed the patients History and Physical, chart, labs and discussed the procedure including the  risks, benefits and alternatives for the proposed anesthesia with the patient or authorized representative who has indicated his/her understanding and acceptance.   Dental advisory given  Plan Discussed with: CRNA, Anesthesiologist and Surgeon  Anesthesia Plan Comments:         Anesthesia Quick Evaluation

## 2012-07-12 ENCOUNTER — Encounter (HOSPITAL_COMMUNITY): Payer: Self-pay | Admitting: Neurosurgery

## 2012-07-12 LAB — GLUCOSE, CAPILLARY: Glucose-Capillary: 227 mg/dL — ABNORMAL HIGH (ref 70–99)

## 2012-07-12 MED ORDER — DSS 100 MG PO CAPS: 100.0000 mg | ORAL_CAPSULE | Freq: Two times a day (BID) | ORAL | Status: DC

## 2012-07-12 MED ORDER — OXYCODONE-ACETAMINOPHEN 10-325 MG PO TABS: 1.0000 | ORAL_TABLET | ORAL | Status: DC | PRN

## 2012-07-12 MED ORDER — DIAZEPAM 5 MG PO TABS: 5.0000 mg | ORAL_TABLET | Freq: Four times a day (QID) | ORAL | Status: DC | PRN

## 2012-07-12 NOTE — Progress Notes (Signed)
Patient ID: [name], female   DOB: Jul 27, 1951, 61 y.o.   MRN: [id number] Subjective:  The patient is alert and pleasant. She looks well. She wants to go home tomorrow.  Objective: Vital signs in last 24 hours: Temp:  [97.2 F (36.2 C)-98.6 F (37 C)] 97.6 F (36.4 C) (11/26 0542) Pulse Rate:  [72-89] 81  (11/26 0542) Resp:  [14-28] 18  (11/26 0542) BP: (105-133)/(44-71) 112/57 mmHg (11/26 0542) SpO2:  [93 %-99 %] 94 % (11/26 0542)  Intake/Output from previous day: 11/25 0701 - 11/26 0700 In: 2950 [P.O.:750; I.V.:2200] Out: 920 [Urine:900; Blood:20] Intake/Output this shift:    Physical exam the patient is alert and oriented. Her strength is normal in all 4 extremities. Her dressing is clean and dry without evidence of hematoma or shift.  Lab Results: No results found for this basename: WBC:2,HGB:2,HCT:2,PLT:2 in the last 72 hours BMET No results found for this basename: NA:2,K:2,CL:2,CO2:2,GLUCOSE:2,BUN:2,CREATININE:2,CALCIUM:2 in the last 72 hours  Studies/Results: Dg Cervical Spine 2-3 Views  07/11/2012  *RADIOLOGY REPORT*  Clinical Data: Cervical disc disease.  CERVICAL SPINE - 2-3 VIEW  Comparison: MRI dated 04/30/2012  Findings: Radiograph #1 demonstrates a localization needle at C4-5. Radiograph #2 demonstrates the patient has undergone anterior cervical fusion at C5-6.  The fused level is partially obscured by the patient's shoulders.  IMPRESSION: Anterior cervical fusion performed at C5-6.   Original Report Authenticated By: Francene Boyers, M.D.     Assessment/Plan: Postop day 1: The patient is doing well. We'll plan to send her home tomorrow. I gave her her discharge instructions and answered all questions.  LOS: 1 day     Rishaan Gunner D 07/12/2012, 7:39 AM

## 2012-07-13 LAB — GLUCOSE, CAPILLARY

## 2012-07-13 NOTE — Discharge Summary (Signed)
Physician Discharge Summary  Patient ID: [name] MRN: [id number] DOB/AGE: [birth year] 61 y.o.  Admit date: 07/11/2012 Discharge date: 07/13/2012  Admission Diagnoses: C5-6 spondylosis, stenosis, cervical radiculopathy/myopathy, cervicalgia  Discharge Diagnoses: The same Active Problems:  * No active hospital problems. *    Discharged Condition: good  Hospital Course: I admitted the patient to [facility] ([facility] 07/11/2012. On that day I performed a C5-6 intracervical discectomy, fusion and plating. The surgery went well.  The patient's postop course was unremarkable and she was discharged home on postop day #2 eye 07/13/2012. The patient was given oral and written discharge instructions. All her questions were answered.  Consults: None Significant Diagnostic Studies: None Treatments: C5-6 anterior cervicectomy, fusion, and plating. Discharge Exam: Blood pressure 135/76, pulse 79, temperature 98.3 F (36.8 C), temperature source Oral, resp. rate 18, SpO2 93.00%. Patient is alert and pleasant. She looks well. Her dressing is clean and dry. There is no evidence of hematoma or shift. Her strength is normal.  Disposition: Home     Medication List     As of 07/13/2012 10:33 AM    STOP taking these medications         traMADol 50 MG tablet   Commonly known as: ULTRAM      TAKE these medications         albuterol 108 (90 BASE) MCG/ACT inhaler   Commonly known as: PROVENTIL HFA;VENTOLIN HFA   Inhale 2 puffs into the lungs every 4 (four) hours as needed for wheezing or shortness of breath.      aspirin EC 81 MG tablet   Take 81 mg by mouth daily.      Azilsartan-Chlorthalidone 40-12.5 MG Tabs   Take 0.5 tablets by mouth daily.      budesonide-formoterol 80-4.5 MCG/ACT inhaler   Commonly known as: SYMBICORT   Inhale 2 puffs into the lungs 2 (two) times daily.      calcium carbonate 600 MG Tabs   Commonly known as: OS-CAL   Take 1 tablet (600 mg total) by mouth  daily.      carvedilol 12.5 MG tablet   Commonly known as: COREG   Take 1 tablet (12.5 mg total) by mouth 2 (two) times daily.      cholecalciferol 1000 UNITS tablet   Commonly known as: VITAMIN D   Take 1,000 Units by mouth 2 (two) times daily.      clonazePAM 0.5 MG tablet   Commonly known as: KLONOPIN   Take 0.5 mg by mouth at bedtime.      Cranberry 500 MG Caps   Take 500 mg by mouth 2 (two) times daily.      diazepam 5 MG tablet   Commonly known as: VALIUM   Take 1 tablet (5 mg total) by mouth every 6 (six) hours as needed.      dofetilide 250 MCG capsule   Commonly known as: [name]   Take 1 capsule (250 mcg total) by mouth 2 (two) times daily.      DSS 100 MG Caps   Take 100 mg by mouth 2 (two) times daily.      esomeprazole 40 MG capsule   Commonly known as: NEXIUM   Take 40 mg by mouth 2 (two) times daily.      ferrous sulfate 325 (65 FE) MG tablet   Take 1 tablet (325 mg total) by mouth daily.      insulin glargine 100 UNIT/ML injection   Commonly known as: LANTUS  Inject 40 Units into the skin at bedtime.      loratadine 10 MG tablet   Commonly known as: CLARITIN   Take 10 mg by mouth every morning.      metFORMIN 500 MG tablet   Commonly known as: GLUCOPHAGE   Take 500 mg by mouth 2 (two) times daily with a meal.      niacin 500 MG CR tablet   Commonly known as: NIASPAN   Take 500 mg by mouth at bedtime.      NITROSTAT 0.4 MG SL tablet   Generic drug: nitroGLYCERIN   place 1 tablet under the tongue if needed every 5 minutes for chest pain for 3 doses IF NO RELIEF AFTER 3RD DOSE CALL PRESCRIBER OR 911.      oxyCODONE-acetaminophen 10-325 MG per tablet   Commonly known as: PERCOCET   Take 1 tablet by mouth every 4 (four) hours as needed for pain.      PARoxetine 12.5 MG 24 hr tablet   Commonly known as: PAXIL-CR   Take 12.5 mg by mouth at bedtime.      potassium chloride 10 MEQ tablet   Commonly known as: K-DUR,KLOR-CON   Take 1 tablet (10  mEq total) by mouth daily.      rizatriptan 10 MG tablet   Commonly known as: MAXALT   Take 10 mg by mouth as needed. For migraines. May repeat in 2 hours if needed.      rosuvastatin 10 MG tablet   Commonly known as: CRESTOR   Take 10 mg by mouth at bedtime.      triamcinolone 55 MCG/ACT nasal inhaler   Commonly known as: NASACORT   Place 2 sprays into the nose 2 (two) times daily.      VICTOZA 18 MG/3ML Soln   Generic drug: Liraglutide   Inject 1.8 mg into the skin daily.      vitamin C 500 MG tablet   Commonly known as: ASCORBIC ACID   Take 1 tablet (500 mg total) by mouth daily.         SignedCristi Loron 07/13/2012, 10:33 AM

## 2012-07-13 NOTE — Progress Notes (Signed)
Pt requesting RW.  NCM received order from attending for RW. NCM contacted pt on her cell and left message to pick up from [facility] store. Faxed order and that pt will pick up. Isidoro Donning RN CCM Case Mgmt phone [phone]

## 2012-07-13 NOTE — Progress Notes (Signed)
Pt dc instructions  Provided. Pt verbalized understanding. Pt under no s/s distress. Iv dc intact. Pt given information on [facility], directions and number to pick up walker once order by MD according to case management.

## 2012-07-18 NOTE — Care Management Note (Signed)
    Page 1 of 2   07/18/2012     12:15:27 PM   CARE MANAGEMENT NOTE 07/18/2012  Patient:  [name], [name]   Account Number:  [id number]  Date Initiated:  07/13/2012  Documentation initiated by:  [facility]  Subjective/Objective Assessment:     Action/Plan:   Anticipated DC Date:  07/13/2012   Anticipated DC Plan:  HOME/SELF CARE      DC Planning Services  CM consult      Choice offered to / List presented to:     DME arranged  Levan Hurst      DME agency  [facility]        Status of service:  Completed, signed off Medicare Important Message given?   (If response is "NO", the following Medicare IM given date fields will be blank) Date Medicare IM given:   Date Additional Medicare IM given:    Discharge Disposition:  HOME/SELF CARE  Per UR Regulation:    If discussed at Long Length of Stay Meetings, dates discussed:    Comments:  07/18/2012 1210 NCM received call from [facility] rep and they will deliver RW to pt's home. [facility] will follow up with pt. Isidoro Donning RN CCM Case Mgmt phone [phone]  07/18/2012 1125 NCM contacted [facility] in regards to order for RW and spoke to rep. Explained DME orders are automatically sent to their office and NCM faxed order to inform them pt was d/c home prior to delivery of DME. Message was left with pt to pick up from home store by NCM. But when pt called to follow up with [facility] it was stated to her no order was in the system for DME. [facility] rep will have rep with [employer] access give NCM call back about delivery of RW to pt's home. Pt states she is not able to pick and does not have family who can pick up DME. Isidoro Donning RN CCM Case Mgmt phone [phone]  07/18/2012 1100 NCM received message from [address] Director. Pt did not pick up RW from [facility]. NCM made outreach to pt and explained order from her physician was obtained after her d/c home. Her surgeon was in surgery and order for RW comes from physician. States she did inquire about RW  at d/c. but was not sure of the process for receiving from hospital. Pt states she is unable to pick up RW and wanted to see if RW can be delivered to her home. She received message that NCM left on vm and when she called [facility] they did not have record of order. NCM explained she will follow up with [facility] for delivery of RW to her home and see if agency can waive delivery charge. Isidoro Donning RN CCM [phone]   07/13/2012 1518 Pt is requesting RW. NCM received order from attending for DME. NCM contacted pt and left message on cell phone to pick up from [facility] store. Isidoro Donning RN CCM Case Mgmt phone [phone]

## 2012-07-18 NOTE — Progress Notes (Signed)
   CARE MANAGEMENT NOTE 07/18/2012  Patient:  [name], [name]   Account Number:  [id number]  Date Initiated:  07/13/2012  Documentation initiated by:  [facility]  Subjective/Objective Assessment:     Action/Plan:   Anticipated DC Date:  07/13/2012   Anticipated DC Plan:  HOME/SELF CARE      DC Planning Services  CM consult      Choice offered to / List presented to:     DME arranged  Levan Hurst      DME agency  [facility]        Status of service:  Completed, signed off Medicare Important Message given?   (If response is "NO", the following Medicare IM given date fields will be blank) Date Medicare IM given:   Date Additional Medicare IM given:    Discharge Disposition:  HOME/SELF CARE  Per UR Regulation:    If discussed at Long Length of Stay Meetings, dates discussed:    Comments:  07/18/2012 1125 NCM contacted [facility] in regards to order for RW and spoke to rep. Explained DME orders are automatically sent to their office and NCM faxed order to inform them pt was d/c home prior to delivery of DME. Message was left with pt to pick up from home store by NCM. But when pt called to follow up with [facility] it was stated to her no order was in the system for DME. [facility] rep will have rep with [employer] access give NCM call back about delivery of RW to pt's home. Pt states she is not able to pick and does not have family who can pick up DME. Isidoro Donning RN CCM Case Mgmt phone [phone]  07/18/2012 1100 NCM received message from [address] Director. Pt did not pick up RW from [facility]. NCM made outreach to pt and explained order from her physician was obtained after her d/c home. Her surgeon was in surgery and order for RW comes from physician. States she did inquire about RW at d/c. but was not sure of the process for receiving from hospital. Pt states she is unable to pick up RW and wanted to see if RW can be delivered to her home. She received message that NCM left on vm and when  she called [facility] they did not have record of order. NCM explained she will follow up with [facility] for delivery of RW to her home and see if agency can waive delivery charge. Isidoro Donning RN CCM [phone]   07/13/2012 1518 Pt is requesting RW. NCM received order from attending for DME. NCM contacted pt and left message on cell phone to pick up from [facility] store. Isidoro Donning RN CCM Case Mgmt phone [phone]

## 2012-08-15 ENCOUNTER — Other Ambulatory Visit: Payer: Self-pay | Admitting: Gastroenterology

## 2012-08-25 ENCOUNTER — Emergency Department (HOSPITAL_COMMUNITY): Payer: Medicaid Other

## 2012-08-25 ENCOUNTER — Telehealth: Payer: Self-pay | Admitting: Cardiovascular Disease

## 2012-08-25 ENCOUNTER — Encounter (HOSPITAL_COMMUNITY): Payer: Self-pay | Admitting: Cardiology

## 2012-08-25 ENCOUNTER — Emergency Department (HOSPITAL_COMMUNITY)
Admission: EM | Admit: 2012-08-25 | Discharge: 2012-08-25 | Disposition: A | Discharge status: Home / Self Care | Payer: Medicaid Other | Attending: Emergency Medicine | Admitting: Emergency Medicine

## 2012-08-25 DIAGNOSIS — I10 Essential (primary) hypertension: Secondary | ICD-10-CM | POA: Insufficient documentation

## 2012-08-25 DIAGNOSIS — Z8669 Personal history of other diseases of the nervous system and sense organs: Secondary | ICD-10-CM | POA: Insufficient documentation

## 2012-08-25 DIAGNOSIS — G909 Disorder of the autonomic nervous system, unspecified: Secondary | ICD-10-CM | POA: Insufficient documentation

## 2012-08-25 DIAGNOSIS — F3289 Other specified depressive episodes: Secondary | ICD-10-CM | POA: Insufficient documentation

## 2012-08-25 DIAGNOSIS — Z7982 Long term (current) use of aspirin: Secondary | ICD-10-CM | POA: Insufficient documentation

## 2012-08-25 DIAGNOSIS — Z860100 Personal history of colon polyps, unspecified: Secondary | ICD-10-CM | POA: Insufficient documentation

## 2012-08-25 DIAGNOSIS — R0789 Other chest pain: Secondary | ICD-10-CM | POA: Insufficient documentation

## 2012-08-25 DIAGNOSIS — Z794 Long term (current) use of insulin: Secondary | ICD-10-CM | POA: Insufficient documentation

## 2012-08-25 DIAGNOSIS — E785 Hyperlipidemia, unspecified: Secondary | ICD-10-CM | POA: Insufficient documentation

## 2012-08-25 DIAGNOSIS — E1149 Type 2 diabetes mellitus with other diabetic neurological complication: Secondary | ICD-10-CM | POA: Insufficient documentation

## 2012-08-25 DIAGNOSIS — R51 Headache: Secondary | ICD-10-CM | POA: Insufficient documentation

## 2012-08-25 DIAGNOSIS — Z79899 Other long term (current) drug therapy: Secondary | ICD-10-CM | POA: Insufficient documentation

## 2012-08-25 DIAGNOSIS — Z87442 Personal history of urinary calculi: Secondary | ICD-10-CM | POA: Insufficient documentation

## 2012-08-25 DIAGNOSIS — Z8719 Personal history of other diseases of the digestive system: Secondary | ICD-10-CM | POA: Insufficient documentation

## 2012-08-25 DIAGNOSIS — G8929 Other chronic pain: Secondary | ICD-10-CM | POA: Insufficient documentation

## 2012-08-25 DIAGNOSIS — J45909 Unspecified asthma, uncomplicated: Secondary | ICD-10-CM | POA: Insufficient documentation

## 2012-08-25 DIAGNOSIS — F329 Major depressive disorder, single episode, unspecified: Secondary | ICD-10-CM | POA: Insufficient documentation

## 2012-08-25 DIAGNOSIS — Z8739 Personal history of other diseases of the musculoskeletal system and connective tissue: Secondary | ICD-10-CM | POA: Insufficient documentation

## 2012-08-25 DIAGNOSIS — N390 Urinary tract infection, site not specified: Secondary | ICD-10-CM | POA: Insufficient documentation

## 2012-08-25 DIAGNOSIS — I252 Old myocardial infarction: Secondary | ICD-10-CM | POA: Insufficient documentation

## 2012-08-25 DIAGNOSIS — R079 Chest pain, unspecified: Secondary | ICD-10-CM

## 2012-08-25 LAB — CBC
HCT: 36.3 % (ref 36.0–46.0)
MCH: 29.3 pg (ref 26.0–34.0)
MCV: 87.1 fL (ref 78.0–100.0)
Platelets: 209 10*3/uL (ref 150–400)
RBC: 4.17 MIL/uL (ref 3.87–5.11)

## 2012-08-25 LAB — URINE MICROSCOPIC-ADD ON

## 2012-08-25 LAB — URINALYSIS, ROUTINE W REFLEX MICROSCOPIC
Bilirubin Urine: NEGATIVE
Glucose, UA: NEGATIVE mg/dL
Hgb urine dipstick: NEGATIVE
Ketones, ur: NEGATIVE mg/dL
Protein, ur: NEGATIVE mg/dL
pH: 5 (ref 5.0–8.0)

## 2012-08-25 LAB — BASIC METABOLIC PANEL
BUN: 14 mg/dL (ref 6–23)
CO2: 21 mEq/L (ref 19–32)
Calcium: 10 mg/dL (ref 8.4–10.5)
Chloride: 102 mEq/L (ref 96–112)
Creatinine, Ser: 1.22 mg/dL — ABNORMAL HIGH (ref 0.50–1.10)
Glucose, Bld: 126 mg/dL — ABNORMAL HIGH (ref 70–99)

## 2012-08-25 MED ORDER — CEPHALEXIN 250 MG PO CAPS
500.0000 mg | ORAL_CAPSULE | Freq: Once | ORAL | Status: AC
  Administered 2012-08-25: 500 mg via ORAL
  Filled 2012-08-25: qty 2

## 2012-08-25 MED ORDER — ONDANSETRON HCL 4 MG/2ML IJ SOLN
4.0000 mg | Freq: Once | INTRAMUSCULAR | Status: AC
  Administered 2012-08-25: 4 mg via INTRAVENOUS
  Filled 2012-08-25: qty 2

## 2012-08-25 MED ORDER — MORPHINE SULFATE 4 MG/ML IJ SOLN
4.0000 mg | Freq: Once | INTRAMUSCULAR | Status: AC
  Administered 2012-08-25: 4 mg via INTRAVENOUS
  Filled 2012-08-25: qty 1

## 2012-08-25 MED ORDER — OXYCODONE-ACETAMINOPHEN 5-325 MG PO TABS
1.0000 | ORAL_TABLET | Freq: Once | ORAL | Status: AC
  Administered 2012-08-25: 1 via ORAL
  Filled 2012-08-25: qty 1

## 2012-08-25 MED ORDER — CEPHALEXIN 500 MG PO CAPS: 500.0000 mg | ORAL_CAPSULE | Freq: Four times a day (QID) | ORAL | Status: DC

## 2012-08-25 MED ORDER — DIPHENHYDRAMINE HCL 25 MG PO CAPS
25.0000 mg | ORAL_CAPSULE | Freq: Once | ORAL | Status: AC
  Administered 2012-08-25: 25 mg via ORAL
  Filled 2012-08-25: qty 1

## 2012-08-25 NOTE — ED Provider Notes (Signed)
History     CSN: [id number]  Arrival date & time 08/25/12  1455   First MD Initiated Contact with Patient 08/25/12 1507      Chief Complaint  Patient presents with  . Chest Pain  . Shortness of Breath  . Emesis    (Consider location/radiation/quality/duration/timing/severity/associated sxs/prior treatment) HPI  62 year old female with several cardiac RFs, but normal cardiac cath last year presents complaining of chest pain. Patient reports she was awoke this morning with sharp stabbing pain to her mid chest, radiates to her left arm with associated shortness of breath and heart palpitation. She checked her vital sign and noticed that her heart rate was around 120. The pain lasted for about 15 minutes and resolved. She proceeds takes her usual home medication. A few hours later the pain returns again, lasting for minutes, and resolved. She reports she was afraid to move needed that it worsened the pain. She also took a sublingual nitroglycerin which provide some relief. Patient attempted to contact her PCP but was unable to get in touch with them. Therefore she called her heart Dr. and was recommended to come to the ER for further evaluation. She is currently endorse sharp midsternal chest pain with shortness of breath. She denies fever, chills, sneezing, coughing, hemoptysis, dyspnea on exertion, leg swelling, or rash.   Past Medical History  Diagnosis Date  . Diabetes mellitus     type 2  . Hypertension   . Hyperlipidemia   . LVH (left ventricular hypertrophy)   . Arthritis   . Colon polyps   . Diverticulosis   . Gallstones   . IBS (irritable bowel syndrome)   . Kidney stones   . Myocardial infarction   . Anginal pain     OCT 2013  . Paroxysmal a-fib     coumadin ( Chads2=2) Echo 09/05/10 EF 55-60%; mod LVH  . Depression   . 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 Sarcoidosis of lung     NONACTIVE      SEES DR WUJW  . Seizures     YEARS AGO NONE SINCE TEENS  . Chronic headaches   .  Fibromyalgia   . Peripheral neuropathy     Past Surgical History  Procedure Date  . Left ankle fracture     closed reduction March 2008  . Wrist arthroscopy     June 2003  . Carpal tunnel release 2003    bilateral  . Subcutaneous transposition of left ulnar nerve   . Abdominal hysterectomy   . Cholecystectomy   . Elbow surgery     bilateral  . Esophageal manometry 03/21/2012    Procedure: ESOPHAGEAL MANOMETRY (EM);  Surgeon: [name], MD;  Location: [facility];  Service: Endoscopy;  Laterality: N/A;  . Colon surgery   . Anterior cervical decomp/discectomy fusion 07/11/2012    Procedure: ANTERIOR CERVICAL DECOMPRESSION/DISCECTOMY FUSION 1 LEVEL;  Surgeon: [name], MD;  Location: [facility]UR[facility];  Service: Neurosurgery;  Laterality: N/A;  Cervical Five-Six Anterior Cervical Decompression with Fusion Interbody Prothesis Plating and Bonegraft    Family History  Problem Relation Age of Onset  . Heart attack Mother     @ age 37  . Heart attack Brother     @ age 18  . Colon cancer Maternal Aunt   . Mental illness Mother   . Prostate cancer Maternal Grandfather   . Ovarian cancer Maternal Aunt     and cousin  . Diabetes Mother   .  Diabetes      siblings  . Hypertension Mother     siblings  . Lupus Sister     History  Substance Use Topics  . Smoking status: Never Smoker   . Smokeless tobacco: Never Used  . Alcohol Use: No    OB History    Grav Para Term Preterm Abortions TAB SAB Ect Mult Living                  Review of Systems  Constitutional:       10 Systems reviewed and all are negative for acute change except as noted in the HPI.     Allergies  Amitriptyline; Hydromorphone hcl; and Prednisone  Home Medications   Current Outpatient Rx  Name  Route  Sig  Dispense  Refill  . ALBUTEROL SULFATE HFA 108 (90 BASE) MCG/ACT IN AERS   Inhalation   Inhale 2 puffs into the lungs every 4 (four) hours as needed for wheezing or shortness of breath.          . ASPIRIN EC 81 MG PO TBEC   Oral   Take 81 mg by mouth daily.         . BUDESONIDE-FORMOTEROL FUMARATE 80-4.5 MCG/ACT IN AERO   Inhalation   Inhale 2 puffs into the lungs 2 (two) times daily.         Marland Kitchen CALCIUM CARBONATE 600 MG PO TABS   Oral   Take 1 tablet (600 mg total) by mouth daily.         Marland Kitchen CARVEDILOL 12.5 MG PO TABS   Oral   Take 1 tablet (12.5 mg total) by mouth 2 (two) times daily.   180 tablet   3   . VITAMIN D 1000 UNITS PO TABS   Oral   Take 1,000 Units by mouth 2 (two) times daily.          Marland Kitchen CLONAZEPAM 0.5 MG PO TABS   Oral   Take 0.5 mg by mouth at bedtime.         Marland Kitchen CRANBERRY 500 MG PO CAPS   Oral   Take 500 mg by mouth 2 (two) times daily.         Marland Kitchen DIAZEPAM 5 MG PO TABS   Oral   Take 1 tablet (5 mg total) by mouth every 6 (six) hours as needed.   50 tablet   1   . DSS 100 MG PO CAPS   Oral   Take 100 mg by mouth 2 (two) times daily.   60 capsule   1   . DOFETILIDE 250 MCG PO CAPS   Oral   Take 1 capsule (250 mcg total) by mouth 2 (two) times daily.   60 capsule   1   . ESOMEPRAZOLE MAGNESIUM 40 MG PO CPDR   Oral   Take 40 mg by mouth 2 (two) times daily.         Marland Kitchen FERROUS SULFATE 325 (65 FE) MG PO TABS   Oral   Take 1 tablet (325 mg total) by mouth daily.         . INSULIN GLARGINE 100 UNIT/ML [place] SOLN   Subcutaneous   Inject 40 Units into the skin at bedtime.         Marland Kitchen LIRAGLUTIDE 18 MG/3ML [place] SOLN   Subcutaneous   Inject 1.8 mg into the skin daily.         Marland Kitchen LORATADINE 10 MG PO TABS   Oral  Take 10 mg by mouth every morning.         Marland Kitchen METFORMIN HCL 500 MG PO TABS   Oral   Take 500 mg by mouth 2 (two) times daily with a meal.         . NIACIN ER (ANTIHYPERLIPIDEMIC) 500 MG PO TBCR   Oral   Take 500 mg by mouth at bedtime.          Marland Kitchen NITROSTAT 0.4 MG SL SUBL      place 1 tablet under the tongue if needed every 5 minutes for chest pain for 3 doses IF NO RELIEF AFTER 3RD DOSE CALL  PRESCRIBER OR 911.   25 each   12   . OXYCODONE-ACETAMINOPHEN 10-325 MG PO TABS   Oral   Take 1 tablet by mouth every 4 (four) hours as needed for pain.   100 tablet   0   . PAROXETINE HCL ER 12.5 MG PO TB24   Oral   Take 12.5 mg by mouth at bedtime.          Marland Kitchen POTASSIUM CHLORIDE CRYS ER 10 MEQ PO TBCR   Oral   Take 1 tablet (10 mEq total) by mouth daily.   30 tablet   11   . RIZATRIPTAN BENZOATE 10 MG PO TABS   Oral   Take 10 mg by mouth as needed. For migraines. May repeat in 2 hours if needed.         Marland Kitchen ROSUVASTATIN CALCIUM 10 MG PO TABS   Oral   Take 10 mg by mouth at bedtime.          . TRIAMCINOLONE ACETONIDE 55 MCG/ACT NA INHA   Nasal   Place 2 sprays into the nose 2 (two) times daily.          Marland Kitchen VITAMIN C 500 MG PO TABS   Oral   Take 1 tablet (500 mg total) by mouth daily.           BP 119/69  Pulse 75  Temp 98.4 F (36.9 C) (Oral)  Resp 16  SpO2 98%  Physical Exam  Nursing note and vitals reviewed. Constitutional: She appears well-developed and well-nourished. No distress.       Awake, alert, nontoxic appearance  HENT:  Head: Atraumatic.  Eyes: Conjunctivae normal are normal. Right eye exhibits no discharge. Left eye exhibits no discharge.  Neck: Neck supple.  Cardiovascular: Normal rate and regular rhythm.   Pulmonary/Chest: Effort normal. No respiratory distress. She exhibits no tenderness (tenderness to midsternal region on palpation without crepitus or overlying skin changes).  Abdominal: Soft. There is no tenderness. There is no rebound.  Musculoskeletal: She exhibits no tenderness.       ROM appears intact, no obvious focal weakness  Neurological:       Mental status and motor strength appears intact  Skin: No rash noted.  Psychiatric: She has a normal mood and affect.    ED Course  Procedures (including critical care time)   Labs Reviewed  CBC  BASIC METABOLIC PANEL  URINALYSIS, ROUTINE W REFLEX MICROSCOPIC   No  results found.   No diagnosis found.  4:16 PM  Date: 08/25/2012  Rate:76  Rhythm: normal sinus rhythm  QRS Axis: left  Intervals: normal  QRS: Low QRS voltage  ST/T Wave abnormalities: nonspecific T wave changes  Conduction Disutrbances:none  Narrative Interpretation: Abnormal EKG  Old EKG Reviewed: unchanged  Results for orders placed during the hospital encounter of 08/25/12  CBC      Component Value Range   WBC 4.8  4.0 - 10.5 K/uL   RBC 4.17  3.87 - 5.11 MIL/uL   Hemoglobin 12.2  12.0 - 15.0 g/dL   HCT 40.9  81.1 - 91.4 %   MCV 87.1  78.0 - 100.0 fL   MCH 29.3  26.0 - 34.0 pg   MCHC 33.6  30.0 - 36.0 g/dL   RDW 78.2  95.6 - 21.3 %   Platelets 209  150 - 400 K/uL  BASIC METABOLIC PANEL      Component Value Range   Sodium 141  135 - 145 mEq/L   Potassium 3.3 (*) 3.5 - 5.1 mEq/L   Chloride 102  96 - 112 mEq/L   CO2 21  19 - 32 mEq/L   Glucose, Bld 126 (*) 70 - 99 mg/dL   BUN 14  6 - 23 mg/dL   Creatinine, Ser 0.86 (*) 0.50 - 1.10 mg/dL   Calcium 57.8  8.4 - 46.9 mg/dL   GFR calc non Af Amer 47 (*) >90 mL/min   GFR calc Af Amer 55 (*) >90 mL/min  URINALYSIS, ROUTINE W REFLEX MICROSCOPIC      Component Value Range   Color, Urine YELLOW  YELLOW   APPearance HAZY (*) CLEAR   Specific Gravity, Urine 1.028  1.005 - 1.030   pH 5.0  5.0 - 8.0   Glucose, UA NEGATIVE  NEGATIVE mg/dL   Hgb urine dipstick NEGATIVE  NEGATIVE   Bilirubin Urine NEGATIVE  NEGATIVE   Ketones, ur NEGATIVE  NEGATIVE mg/dL   Protein, ur NEGATIVE  NEGATIVE mg/dL   Urobilinogen, UA 0.2  0.0 - 1.0 mg/dL   Nitrite NEGATIVE  NEGATIVE   Leukocytes, UA MODERATE (*) NEGATIVE  POCT I-STAT TROPONIN I      Component Value Range   Troponin i, poc 0.00  0.00 - 0.08 ng/mL   Comment 3           URINE MICROSCOPIC-ADD ON      Component Value Range   Squamous Epithelial / LPF FEW (*) RARE   WBC, UA 7-10  <3 WBC/hpf   Bacteria, UA FEW (*) RARE   Urine-Other MUCOUS PRESENT     Dg Chest 2 View  08/25/2012   *RADIOLOGY REPORT*  Clinical Data: Chest pain, shortness of breath, emesis H/O hypertension, A-fib, nonsmoker, asthma  CHEST - 2 VIEW  Comparison: 06/16/2012  Findings: Stable mild hilar fullness been present back through 2008 is likely vascular.  Cardiac and mediastinal contours appear unremarkable.  The lungs appear clear.  No pleural effusion.  IMPRESSION:  1.  No significant abnormality is observed.   Original Report Authenticated By: Gaylyn Rong, M.D.    1. Chest pain 2. UTI  MDM    Patient was seen in the ER for the same complaint on November 31 and was evaluated by Dr. Gala Romney. Her complaint was thought to be noncardiac related. Patient was also had a neck surgery (C5-6 intracervical discectomy, fusion and plating) on November 25 by Dr. Barbette Merino  ECG without acute changes, normal sinus. Trop I neg.  CXR unremarkable.  Care discussed with my attending.    Currently CP free.  Will check delta troponin  6:55 PM UA shows WBC 7-10.  I discussed with my attending, we can treat for UTI with Keflex.  Pt will also call her cardiologist office tomorrow for a close follow up.  Strict return precaution given.    8:29 PM  Negative delta troponin.  Pt maintain normal sats with ambulation.  CP has resolved.  My attending has seen pt.  Pt stable for discharge.    BP 105/59  Pulse 71  Temp 98.4 F (36.9 C) (Oral)  Resp 14  SpO2 99%  I have reviewed nursing notes and vital signs. I personally reviewed the imaging tests through PACS system  I reviewed available ER/hospitalization records thought the EMR   Fayrene Helper, PA-C 08/25/12 2030

## 2012-08-25 NOTE — Telephone Encounter (Signed)
Pt states she has had chest pressure throughout the day, took nitro two hours ago with relief and now discomfort is back rating 4/10 discomfort, current vitals 129/59 p 74, c/o nausea, left chest pressure, left arm pain, told pt to chew an asa 325 mg and call 911, she may have family drive her and I re advised 911, pt agreed to plan. Rosann Auerbach was informed

## 2012-08-25 NOTE — Telephone Encounter (Signed)
Pt heart has been palpitations since this morning her Herat rate is going up and down and she has been resting and she wanted to report it to someone she has taken 1 nitro for chest pain

## 2012-08-25 NOTE — ED Notes (Signed)
Pt reports she started having midsternal chest pain with SOB this morning and called her PCP, told to come in for evaluation. Pt presents to triage with n/v and abd pain. States she sees [place] for cardiology.

## 2012-08-25 NOTE — ED Provider Notes (Signed)
4:16 PM  Date: 08/25/2012  Rate:76  Rhythm: normal sinus rhythm  QRS Axis: left  Intervals: normal QRS:  Low QRS voltage  ST/T Wave abnormalities: nonspecific T wave changes  Conduction Disutrbances:none  Narrative Interpretation: Abnormal EKG  Old EKG Reviewed: unchanged    Carleene Cooper III, MD 08/25/12 [phone]

## 2012-08-26 LAB — URINE CULTURE: Culture: NO GROWTH

## 2012-08-26 MED ORDER — ALBUTEROL SULFATE (5 MG/ML) 0.5% IN NEBU
INHALATION_SOLUTION | RESPIRATORY_TRACT | Status: AC
  Filled 2012-08-26: qty 1

## 2012-08-26 NOTE — ED Provider Notes (Signed)
Medical screening examination/treatment/procedure(s) were conducted as a shared visit with non-physician practitioner(s) and myself.  I personally evaluated the patient during the encounter 62 yo woman with pain in the precordial region, with some palpitation.  This lasted about 15 minutes, and then recurred several hours later.  She has had prior workup with cardiac cath in the past that was negative.  Exam showed lungs clear, heart sounds normal.  Lab tests showed negative EKG and TNI; UA showed a UTI. Rx UTI, out pt followup for her nonspecific chest pain.  Carleene Cooper III, MD 08/26/12 1322

## 2012-10-03 ENCOUNTER — Telehealth: Payer: Self-pay | Admitting: Cardiovascular Disease

## 2012-10-03 NOTE — Telephone Encounter (Signed)
**Note De-Identified Lysbeth Dicola Obfuscation** Pt states she has 2 to 3 weeks of [name] left and needs refill through assistance program. Will forward note to Dr. Fabio Bering nurse, Wynona Canes.

## 2012-10-03 NOTE — Telephone Encounter (Signed)
Pt calling re heart med, tikosyn needs reorder with pt assitance program

## 2012-10-04 NOTE — Telephone Encounter (Signed)
CALLED CARE CONNECTION  PT NEEDS TO RE ENROLL   IN PROGRAM  PT AWARE OF ABOVE  WILL LEAVE FORM UP FRONT TO BE SIGNED ALSO NEEDS TO BRING PROOF OF INCOME .[name]

## 2012-10-04 NOTE — Telephone Encounter (Signed)
CARE CONNECTION NUMBER [phone] PT ID [id number]

## 2012-10-10 NOTE — Telephone Encounter (Signed)
LETTER WITH  PROOF OF INCOME NEEDING NOTARIZED  STILL AWAITING  LETTER BEFORE CAN MAIL APPLICATION FOR ASSISTANCE  FOR  [name] RENEWAL  PT TO DROP OFF  TOMORROW./CY

## 2012-10-11 NOTE — Telephone Encounter (Signed)
PT DROPPED OFF PROOF OF INCOME LETTER AND WAS  FAXED TO ASSISTANCE  PROGRAM AS WELL AS  APPLICATION TO GET [name]  APPROVED./CY

## 2012-10-16 ENCOUNTER — Emergency Department (HOSPITAL_COMMUNITY)
Admission: EM | Admit: 2012-10-16 | Discharge: 2012-10-16 | Disposition: A | Discharge status: Home / Self Care | Payer: Medicaid Other | Attending: Emergency Medicine | Admitting: Emergency Medicine

## 2012-10-16 ENCOUNTER — Encounter (HOSPITAL_COMMUNITY): Payer: Self-pay | Admitting: *Deleted

## 2012-10-16 ENCOUNTER — Emergency Department (HOSPITAL_COMMUNITY): Payer: Medicaid Other

## 2012-10-16 DIAGNOSIS — E785 Hyperlipidemia, unspecified: Secondary | ICD-10-CM | POA: Insufficient documentation

## 2012-10-16 DIAGNOSIS — Z8719 Personal history of other diseases of the digestive system: Secondary | ICD-10-CM | POA: Insufficient documentation

## 2012-10-16 DIAGNOSIS — F3289 Other specified depressive episodes: Secondary | ICD-10-CM | POA: Insufficient documentation

## 2012-10-16 DIAGNOSIS — I252 Old myocardial infarction: Secondary | ICD-10-CM | POA: Insufficient documentation

## 2012-10-16 DIAGNOSIS — J45909 Unspecified asthma, uncomplicated: Secondary | ICD-10-CM | POA: Insufficient documentation

## 2012-10-16 DIAGNOSIS — E119 Type 2 diabetes mellitus without complications: Secondary | ICD-10-CM | POA: Insufficient documentation

## 2012-10-16 DIAGNOSIS — I10 Essential (primary) hypertension: Secondary | ICD-10-CM | POA: Insufficient documentation

## 2012-10-16 DIAGNOSIS — M129 Arthropathy, unspecified: Secondary | ICD-10-CM | POA: Insufficient documentation

## 2012-10-16 DIAGNOSIS — G609 Hereditary and idiopathic neuropathy, unspecified: Secondary | ICD-10-CM | POA: Insufficient documentation

## 2012-10-16 DIAGNOSIS — IMO0002 Reserved for concepts with insufficient information to code with codable children: Secondary | ICD-10-CM | POA: Insufficient documentation

## 2012-10-16 DIAGNOSIS — R5383 Other fatigue: Secondary | ICD-10-CM

## 2012-10-16 DIAGNOSIS — Z7982 Long term (current) use of aspirin: Secondary | ICD-10-CM | POA: Insufficient documentation

## 2012-10-16 DIAGNOSIS — R51 Headache: Secondary | ICD-10-CM | POA: Insufficient documentation

## 2012-10-16 DIAGNOSIS — Z87442 Personal history of urinary calculi: Secondary | ICD-10-CM | POA: Insufficient documentation

## 2012-10-16 DIAGNOSIS — Z8619 Personal history of other infectious and parasitic diseases: Secondary | ICD-10-CM | POA: Insufficient documentation

## 2012-10-16 DIAGNOSIS — IMO0001 Reserved for inherently not codable concepts without codable children: Secondary | ICD-10-CM | POA: Insufficient documentation

## 2012-10-16 DIAGNOSIS — Z79899 Other long term (current) drug therapy: Secondary | ICD-10-CM | POA: Insufficient documentation

## 2012-10-16 DIAGNOSIS — Z860100 Personal history of colon polyps, unspecified: Secondary | ICD-10-CM | POA: Insufficient documentation

## 2012-10-16 DIAGNOSIS — R5381 Other malaise: Secondary | ICD-10-CM | POA: Insufficient documentation

## 2012-10-16 DIAGNOSIS — Z8679 Personal history of other diseases of the circulatory system: Secondary | ICD-10-CM | POA: Insufficient documentation

## 2012-10-16 DIAGNOSIS — N390 Urinary tract infection, site not specified: Secondary | ICD-10-CM

## 2012-10-16 DIAGNOSIS — Z8709 Personal history of other diseases of the respiratory system: Secondary | ICD-10-CM | POA: Insufficient documentation

## 2012-10-16 DIAGNOSIS — Z794 Long term (current) use of insulin: Secondary | ICD-10-CM | POA: Insufficient documentation

## 2012-10-16 DIAGNOSIS — G8929 Other chronic pain: Secondary | ICD-10-CM | POA: Insufficient documentation

## 2012-10-16 DIAGNOSIS — F329 Major depressive disorder, single episode, unspecified: Secondary | ICD-10-CM | POA: Insufficient documentation

## 2012-10-16 LAB — COMPREHENSIVE METABOLIC PANEL
ALT: 14 U/L (ref 0–35)
Albumin: 4.1 g/dL (ref 3.5–5.2)
Alkaline Phosphatase: 89 U/L (ref 39–117)
BUN: 12 mg/dL (ref 6–23)
Chloride: 102 mEq/L (ref 96–112)
GFR calc Af Amer: 61 mL/min — ABNORMAL LOW (ref >90)
Glucose, Bld: 109 mg/dL — ABNORMAL HIGH (ref 70–99)
Potassium: 3.7 mEq/L (ref 3.5–5.1)
Sodium: 138 mEq/L (ref 135–145)
Total Bilirubin: 0.3 mg/dL (ref 0.3–1.2)
Total Protein: 7.9 g/dL (ref 6.0–8.3)

## 2012-10-16 LAB — URINALYSIS, ROUTINE W REFLEX MICROSCOPIC
Bilirubin Urine: NEGATIVE
Glucose, UA: NEGATIVE mg/dL
Hgb urine dipstick: NEGATIVE
Specific Gravity, Urine: 1.036 — ABNORMAL HIGH (ref 1.005–1.030)

## 2012-10-16 LAB — CBC
HCT: 35 % — ABNORMAL LOW (ref 36.0–46.0)
MCHC: 33.4 g/dL (ref 30.0–36.0)
MCV: 88.2 fL (ref 78.0–100.0)
Platelets: 183 10*3/uL (ref 150–400)
RDW: 14 % (ref 11.5–15.5)
WBC: 3.9 10*3/uL — ABNORMAL LOW (ref 4.0–10.5)

## 2012-10-16 LAB — URINE MICROSCOPIC-ADD ON

## 2012-10-16 LAB — RAPID URINE DRUG SCREEN, HOSP PERFORMED
Amphetamines: NOT DETECTED
Barbiturates: NOT DETECTED
Benzodiazepines: NOT DETECTED
Cocaine: NOT DETECTED
Opiates: POSITIVE — AB
Tetrahydrocannabinol: NOT DETECTED

## 2012-10-16 LAB — GLUCOSE, CAPILLARY

## 2012-10-16 MED ORDER — CEPHALEXIN 500 MG PO CAPS: 500.0000 mg | ORAL_CAPSULE | Freq: Four times a day (QID) | ORAL | Status: DC

## 2012-10-16 MED ORDER — SODIUM CHLORIDE 0.9 % IV BOLUS (SEPSIS)
1000.0000 mL | Freq: Once | INTRAVENOUS | Status: AC
  Administered 2012-10-16: 1000 mL via INTRAVENOUS

## 2012-10-16 MED ORDER — CEPHALEXIN 250 MG PO CAPS
500.0000 mg | ORAL_CAPSULE | Freq: Once | ORAL | Status: AC
  Administered 2012-10-16: 500 mg via ORAL
  Filled 2012-10-16: qty 2

## 2012-10-16 NOTE — ED Notes (Signed)
Per family member, pt became faint while at church. Having nausea on arrival to ed, reports recently being started on new medications.

## 2012-10-16 NOTE — ED Notes (Signed)
Pt started on new meds recently.  Pt started taking seizure meds Friday.  Pt also began taking Neurontin approximately 3 weeks ago.

## 2012-10-16 NOTE — ED Provider Notes (Signed)
History     CSN: [id number]  Arrival date & time 10/16/12  1324   First MD Initiated Contact with Patient 10/16/12 1537      Chief Complaint  Patient presents with  . Near Syncope    (Consider location/radiation/quality/duration/timing/severity/associated sxs/prior treatment) HPI Pt presents with c/o feeling weak and fatigued.  She states that in church today her weakness got worse and she felt that her knees almost gave way on her.  She did not syncopize.  No chest pain or shortness of breath.  She states this weekend she has not been drinking as much fluid as normal because she has felt tired and has had loose bowels.  No blood in stool.  No fever/chills.  No palpitations, no leg swelling.  No vomiting.  There are no other associated systemic symptoms, there are no other alleviating or modifying factors.   Past Medical History  Diagnosis Date  . Diabetes mellitus     type 2  . Hypertension   . Hyperlipidemia   . LVH (left ventricular hypertrophy)   . Arthritis   . Colon polyps   . Diverticulosis   . Gallstones   . IBS (irritable bowel syndrome)   . Kidney stones   . Myocardial infarction   . Anginal pain     OCT 2013  . Paroxysmal a-fib     coumadin ( Chads2=2) Echo 09/05/10 EF 55-60%; mod LVH  . Depression   . 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 Sarcoidosis of lung     NONACTIVE      SEES DR WUJW  . Seizures     YEARS AGO NONE SINCE TEENS  . Chronic headaches   . Fibromyalgia   . Peripheral neuropathy     Past Surgical History  Procedure Laterality Date  . Left ankle fracture      closed reduction March 2008  . Wrist arthroscopy      June 2003  . Carpal tunnel release  2003    bilateral  . Subcutaneous transposition of left ulnar nerve    . Abdominal hysterectomy    . Cholecystectomy    . Elbow surgery      bilateral  . Esophageal manometry  03/21/2012    Procedure: ESOPHAGEAL MANOMETRY (EM);  Surgeon: [name], MD;  Location: [facility];  Service: Endoscopy;   Laterality: N/A;  . Colon surgery    . Anterior cervical decomp/discectomy fusion  07/11/2012    Procedure: ANTERIOR CERVICAL DECOMPRESSION/DISCECTOMY FUSION 1 LEVEL;  Surgeon: [name], MD;  Location: [facility];  Service: Neurosurgery;  Laterality: N/A;  Cervical Five-Six Anterior Cervical Decompression with Fusion Interbody Prothesis Plating and Bonegraft    Family History  Problem Relation Age of Onset  . Heart attack Mother     @ age 54  . Heart attack Brother     @ age 16  . Colon cancer Maternal Aunt   . Mental illness Mother   . Prostate cancer Maternal Grandfather   . Ovarian cancer Maternal Aunt     and cousin  . Diabetes Mother   . Diabetes      siblings  . Hypertension Mother     siblings  . Lupus Sister     History  Substance Use Topics  . Smoking status: Never Smoker   . Smokeless tobacco: Never Used  . Alcohol Use: No    OB History   Grav Para Term Preterm Abortions TAB SAB Ect Mult Living  Review of Systems ROS reviewed and all otherwise negative except for mentioned in HPI  Allergies  Amitriptyline; Hydromorphone hcl; and Prednisone  Home Medications   Current Outpatient Rx  Name  Route  Sig  Dispense  Refill  . albuterol (PROVENTIL HFA;VENTOLIN HFA) 108 (90 BASE) MCG/ACT inhaler   Inhalation   Inhale 2 puffs into the lungs every 4 (four) hours as needed for wheezing or shortness of breath.         Marland Kitchen aspirin EC 81 MG tablet   Oral   Take 81 mg by mouth daily.         Marland Kitchen atorvastatin (LIPITOR) 10 MG tablet   Oral   Take 10 mg by mouth daily.         . budesonide-formoterol (SYMBICORT) 80-4.5 MCG/ACT inhaler   Inhalation   Inhale 2 puffs into the lungs 2 (two) times daily.         . calcium carbonate (OS-CAL) 600 MG TABS   Oral   Take 1 tablet (600 mg total) by mouth daily.         . carvedilol (COREG) 12.5 MG tablet   Oral   Take 1 tablet (12.5 mg total) by mouth 2 (two) times daily.   180  tablet   3   . cholecalciferol (VITAMIN D) 1000 UNITS tablet   Oral   Take 1,000 Units by mouth 2 (two) times daily.          . clonazePAM (KLONOPIN) 0.5 MG tablet   Oral   Take 0.5 mg by mouth at bedtime.         . Cranberry 500 MG CAPS   Oral   Take 500 mg by mouth 2 (two) times daily.         Marland Kitchen docusate sodium 100 MG CAPS   Oral   Take 100 mg by mouth 2 (two) times daily.   60 capsule   1   . dofetilide ([name]) 250 MCG capsule   Oral   Take 1 capsule (250 mcg total) by mouth 2 (two) times daily.   60 capsule   1   . DULoxetine (CYMBALTA) 60 MG capsule   Oral   Take 60 mg by mouth daily.         Marland Kitchen esomeprazole (NEXIUM) 40 MG capsule   Oral   Take 40 mg by mouth 2 (two) times daily.         . ferrous sulfate 325 (65 FE) MG tablet   Oral   Take 1 tablet (325 mg total) by mouth daily.         Marland Kitchen gabapentin (NEURONTIN) 600 MG tablet   Oral   Take 600 mg by mouth 3 (three) times daily.         . insulin glargine (LANTUS) 100 UNIT/ML injection   Subcutaneous   Inject 40 Units into the skin at bedtime.         . levETIRAcetam (KEPPRA) 500 MG tablet   Oral   Take 500 mg by mouth every 12 (twelve) hours.         . Liraglutide (VICTOZA) 18 MG/3ML SOLN   Subcutaneous   Inject 1.8 mg into the skin daily.         Marland Kitchen loratadine (CLARITIN) 10 MG tablet   Oral   Take 10 mg by mouth every morning.         . metFORMIN (GLUCOPHAGE) 500 MG tablet   Oral   Take 500 mg  by mouth 2 (two) times daily with a meal.         . niacin (NIASPAN) 500 MG CR tablet   Oral   Take 500 mg by mouth at bedtime.          Marland Kitchen NITROSTAT 0.4 MG SL tablet      place 1 tablet under the tongue if needed every 5 minutes for chest pain for 3 doses IF NO RELIEF AFTER 3RD DOSE CALL PRESCRIBER OR 911.   25 each   12   . potassium chloride (K-DUR,KLOR-CON) 10 MEQ tablet   Oral   Take 1 tablet (10 mEq total) by mouth daily.   30 tablet   11   . rizatriptan (MAXALT)  10 MG tablet   Oral   Take 10 mg by mouth as needed. For migraines. May repeat in 2 hours if needed.         . vitamin C (ASCORBIC ACID) 500 MG tablet   Oral   Take 1 tablet (500 mg total) by mouth daily.         . cephALEXin (KEFLEX) 500 MG capsule   Oral   Take 1 capsule (500 mg total) by mouth 4 (four) times daily.   28 capsule   0     BP 172/75  Pulse 67  Temp(Src) 97.7 F (36.5 C) (Oral)  Resp 18  SpO2 98% Vitals reviewed Physical Exam Physical Examination: General appearance - alert, well appearing, and in no distress Mental status - alert, oriented to person, place, and time Eyes - pupils equal and reactive, extraocular eye movements intact, no conjunctival injection or scleral icterus Mouth - mucous membranes moist, pharynx normal without lesions Chest - clear to auscultation, no wheezes, rales or rhonchi, symmetric air entry Heart - normal rate, regular rhythm, normal S1, S2, no murmurs, rubs, clicks or gallops Abdomen - soft, nontender, nondistended, no masses or organomegaly, nabs Neurological - alert, oriented, normal speech, cranial nerves 2-12 tested and intact, strength 5/5 in extremities x 4, sensation intact Extremities - peripheral pulses normal, no pedal edema, no clubbing or cyanosis Skin - normal coloration and turgor, no rashes  ED Course  Procedures (including critical care time)   Date: 10/16/2012  Rate: 67  Rhythm: normal sinus rhythm  QRS Axis: normal  Intervals: normal  ST/T Wave abnormalities: nonspecific T wave changes  Conduction Disutrbances:none  Narrative Interpretation:   Old EKG Reviewed: unchanged compared to prior ekg of 08/25/12   Labs Reviewed  COMPREHENSIVE METABOLIC PANEL - Abnormal; Notable for the following:    Glucose, Bld 109 (*)    Creatinine, Ser 1.12 (*)    GFR calc non Af Amer 52 (*)    GFR calc Af Amer 61 (*)    All other components within normal limits  CBC - Abnormal; Notable for the following:    WBC 3.9  (*)    Hemoglobin 11.7 (*)    HCT 35.0 (*)    All other components within normal limits  URINALYSIS, ROUTINE W REFLEX MICROSCOPIC - Abnormal; Notable for the following:    APPearance CLOUDY (*)    Specific Gravity, Urine 1.036 (*)    Ketones, ur 15 (*)    Leukocytes, UA LARGE (*)    All other components within normal limits  URINE RAPID DRUG SCREEN (HOSP PERFORMED) - Abnormal; Notable for the following:    Opiates POSITIVE (*)    All other components within normal limits  URINE MICROSCOPIC-ADD ON - Abnormal; Notable for  the following:    Squamous Epithelial / LPF FEW (*)    Crystals CA OXALATE CRYSTALS (*)    All other components within normal limits  PROTIME-INR  GLUCOSE, CAPILLARY  POCT I-STAT TROPONIN I   Ct Head Wo Contrast  10/16/2012  *RADIOLOGY REPORT*  Clinical Data: Near-syncope  CT HEAD WITHOUT CONTRAST  Technique:  Contiguous axial images were obtained from the base of the skull through the vertex without contrast.  Comparison: MRI brain dated 09/08/2010  Findings: No evidence of parenchymal hemorrhage or extra-axial fluid collection. No mass lesion, mass effect, or midline shift.  No CT evidence of acute infarction.  Cerebral volume is age appropriate.  No ventriculomegaly.  The visualized paranasal sinuses are essentially clear. The mastoid air cells are unopacified.  No evidence of calvarial fracture.  IMPRESSION: No evidence of acute intracranial abnormality.   Original Report Authenticated By: Charline Bills, M.D.      1. Fatigue   2. Urinary tract infection       MDM  Pt presnting with c/o fatigue and weakness associated with diarrhea this weekend.  Workup reveals UTI- pt started on antibitoics, she is also concerned about starting multiple new medicaitons in the past 2 weeks- cymbalta, neurontin and keppra.  These may be contributing to her symptoms as well.  She has no other acute findings on workup in the ED today.  Advised to have close f/u with her PMD in the  next 1-2 days.  Discharged with strict return precautions.  Pt agreeable with plan.        Ethelda Chick, MD 10/16/12 [phone]

## 2012-10-24 ENCOUNTER — Ambulatory Visit: Payer: Medicaid Other | Attending: Anesthesiology | Admitting: Physical Therapy

## 2012-10-24 DIAGNOSIS — M2550 Pain in unspecified joint: Secondary | ICD-10-CM | POA: Insufficient documentation

## 2012-10-24 DIAGNOSIS — IMO0001 Reserved for inherently not codable concepts without codable children: Secondary | ICD-10-CM | POA: Insufficient documentation

## 2012-10-24 DIAGNOSIS — R269 Unspecified abnormalities of gait and mobility: Secondary | ICD-10-CM | POA: Insufficient documentation

## 2012-10-25 ENCOUNTER — Telehealth: Payer: Self-pay | Admitting: Cardiovascular Disease

## 2012-10-25 NOTE — Telephone Encounter (Signed)
Pt calling re papers that we filled out for pt and her dtr, they had two different date on them, pt wants to talk with christine, pls call [phone]

## 2012-10-25 NOTE — Telephone Encounter (Signed)
PT AWARE FORM REFAXED WITH  CORRECT DATES .Zack Seal

## 2012-10-31 NOTE — Telephone Encounter (Signed)
SPOKE  WITH CARE CONNECTIONS WAS TOLD CORRECT FORM NOT SENT CORRECT  APP LOCATED A FILLED OUT AND FAXED ONCE AGAIN WITH PROOF OF INCOME LETTER FAXED PT AWARE AND  2 WEEKS OF SAMPLES LEFT AT FRONT DESK FOR PICK UP .Zack Seal

## 2012-11-01 ENCOUNTER — Ambulatory Visit: Payer: Medicaid Other | Admitting: Physical Therapy

## 2012-11-01 ENCOUNTER — Telehealth: Payer: Self-pay | Admitting: *Deleted

## 2012-11-01 NOTE — Telephone Encounter (Signed)
PT AWARE MAY USE TENS UNIT .[name]

## 2012-11-01 NOTE — Telephone Encounter (Signed)
OK to use TENS unit

## 2012-11-01 NOTE — Telephone Encounter (Signed)
PT  IS CURRENTLY BEING SEEN IN [facility] AND MD  WANTS PT TO USE TENS UNIT  PT ASKING  IF THERE IS ANY CONTRAINDICATION  WITH HEART AND USING  THAT EQUIPMENT PT AWARE WILL FORWARD TO DR [name] FOR REVIEW./CY

## 2012-11-08 ENCOUNTER — Ambulatory Visit: Payer: Medicaid Other | Admitting: Physical Therapy

## 2012-11-09 ENCOUNTER — Encounter (HOSPITAL_COMMUNITY): Payer: Self-pay | Admitting: *Deleted

## 2012-11-09 ENCOUNTER — Emergency Department (HOSPITAL_COMMUNITY): Payer: Medicaid Other

## 2012-11-09 ENCOUNTER — Emergency Department (HOSPITAL_COMMUNITY)
Admission: EM | Admit: 2012-11-09 | Discharge: 2012-11-09 | Disposition: A | Discharge status: Home / Self Care | Payer: Medicaid Other | Attending: Emergency Medicine | Admitting: Emergency Medicine

## 2012-11-09 DIAGNOSIS — R1031 Right lower quadrant pain: Secondary | ICD-10-CM | POA: Insufficient documentation

## 2012-11-09 DIAGNOSIS — G40909 Epilepsy, unspecified, not intractable, without status epilepticus: Secondary | ICD-10-CM | POA: Insufficient documentation

## 2012-11-09 DIAGNOSIS — E119 Type 2 diabetes mellitus without complications: Secondary | ICD-10-CM | POA: Insufficient documentation

## 2012-11-09 DIAGNOSIS — R000 Tachycardia, unspecified: Secondary | ICD-10-CM | POA: Insufficient documentation

## 2012-11-09 DIAGNOSIS — Z79899 Other long term (current) drug therapy: Secondary | ICD-10-CM | POA: Insufficient documentation

## 2012-11-09 DIAGNOSIS — Z8719 Personal history of other diseases of the digestive system: Secondary | ICD-10-CM | POA: Insufficient documentation

## 2012-11-09 DIAGNOSIS — I480 Paroxysmal atrial fibrillation: Secondary | ICD-10-CM

## 2012-11-09 DIAGNOSIS — F3289 Other specified depressive episodes: Secondary | ICD-10-CM | POA: Insufficient documentation

## 2012-11-09 DIAGNOSIS — I10 Essential (primary) hypertension: Secondary | ICD-10-CM | POA: Insufficient documentation

## 2012-11-09 DIAGNOSIS — Z860100 Personal history of colon polyps, unspecified: Secondary | ICD-10-CM | POA: Insufficient documentation

## 2012-11-09 DIAGNOSIS — J45909 Unspecified asthma, uncomplicated: Secondary | ICD-10-CM | POA: Insufficient documentation

## 2012-11-09 DIAGNOSIS — Z8679 Personal history of other diseases of the circulatory system: Secondary | ICD-10-CM | POA: Insufficient documentation

## 2012-11-09 DIAGNOSIS — Z8669 Personal history of other diseases of the nervous system and sense organs: Secondary | ICD-10-CM | POA: Insufficient documentation

## 2012-11-09 DIAGNOSIS — F329 Major depressive disorder, single episode, unspecified: Secondary | ICD-10-CM | POA: Insufficient documentation

## 2012-11-09 DIAGNOSIS — Z8739 Personal history of other diseases of the musculoskeletal system and connective tissue: Secondary | ICD-10-CM | POA: Insufficient documentation

## 2012-11-09 DIAGNOSIS — Z8619 Personal history of other infectious and parasitic diseases: Secondary | ICD-10-CM | POA: Insufficient documentation

## 2012-11-09 DIAGNOSIS — R110 Nausea: Secondary | ICD-10-CM | POA: Insufficient documentation

## 2012-11-09 DIAGNOSIS — I4891 Unspecified atrial fibrillation: Secondary | ICD-10-CM | POA: Insufficient documentation

## 2012-11-09 DIAGNOSIS — R079 Chest pain, unspecified: Secondary | ICD-10-CM | POA: Insufficient documentation

## 2012-11-09 DIAGNOSIS — Z87442 Personal history of urinary calculi: Secondary | ICD-10-CM | POA: Insufficient documentation

## 2012-11-09 DIAGNOSIS — R002 Palpitations: Secondary | ICD-10-CM

## 2012-11-09 DIAGNOSIS — G8929 Other chronic pain: Secondary | ICD-10-CM | POA: Insufficient documentation

## 2012-11-09 DIAGNOSIS — E785 Hyperlipidemia, unspecified: Secondary | ICD-10-CM | POA: Insufficient documentation

## 2012-11-09 DIAGNOSIS — Z7982 Long term (current) use of aspirin: Secondary | ICD-10-CM | POA: Insufficient documentation

## 2012-11-09 DIAGNOSIS — I252 Old myocardial infarction: Secondary | ICD-10-CM | POA: Insufficient documentation

## 2012-11-09 DIAGNOSIS — Z794 Long term (current) use of insulin: Secondary | ICD-10-CM | POA: Insufficient documentation

## 2012-11-09 LAB — CBC WITH DIFFERENTIAL/PLATELET
Basophils Absolute: 0 10*3/uL (ref 0.0–0.1)
Basophils Relative: 1 % (ref 0–1)
Eosinophils Absolute: 0.1 10*3/uL (ref 0.0–0.7)
Eosinophils Relative: 2 % (ref 0–5)
HCT: 40.9 % (ref 36.0–46.0)
Hemoglobin: 14 g/dL (ref 12.0–15.0)
MCH: 29.7 pg (ref 26.0–34.0)
MCHC: 34.2 g/dL (ref 30.0–36.0)
MCV: 86.8 fL (ref 78.0–100.0)
Monocytes Absolute: 0.3 10*3/uL (ref 0.1–1.0)
Monocytes Relative: 6 % (ref 3–12)
Neutro Abs: 1.5 10*3/uL — ABNORMAL LOW (ref 1.7–7.7)
RDW: 13.8 % (ref 11.5–15.5)

## 2012-11-09 LAB — URINALYSIS, ROUTINE W REFLEX MICROSCOPIC
Bilirubin Urine: NEGATIVE
Hgb urine dipstick: NEGATIVE
Ketones, ur: NEGATIVE mg/dL
Nitrite: NEGATIVE
Urobilinogen, UA: 0.2 mg/dL (ref 0.0–1.0)

## 2012-11-09 LAB — BASIC METABOLIC PANEL
BUN: 12 mg/dL (ref 6–23)
CO2: 22 mEq/L (ref 19–32)
Chloride: 103 mEq/L (ref 96–112)
Creatinine, Ser: 1.15 mg/dL — ABNORMAL HIGH (ref 0.50–1.10)
GFR calc Af Amer: 59 mL/min — ABNORMAL LOW (ref >90)
Potassium: 4.4 mEq/L (ref 3.5–5.1)

## 2012-11-09 LAB — URINE MICROSCOPIC-ADD ON

## 2012-11-09 LAB — POCT I-STAT TROPONIN I: Troponin i, poc: 0 ng/mL (ref 0.00–0.08)

## 2012-11-09 MED ORDER — FENTANYL CITRATE 0.05 MG/ML IJ SOLN
50.0000 ug | Freq: Once | INTRAMUSCULAR | Status: AC
  Administered 2012-11-09: 50 ug via INTRAVENOUS
  Filled 2012-11-09: qty 2

## 2012-11-09 MED ORDER — SODIUM CHLORIDE 0.9 % IV BOLUS (SEPSIS): 1000.0000 mL | Freq: Once | INTRAVENOUS | Status: DC

## 2012-11-09 MED ORDER — SODIUM CHLORIDE 0.9 % IV SOLN
INTRAVENOUS | Status: DC
  Administered 2012-11-09: 11:00:00 via INTRAVENOUS

## 2012-11-09 MED ORDER — DILTIAZEM HCL 25 MG/5ML IV SOLN
15.0000 mg | Freq: Once | INTRAVENOUS | Status: AC
  Administered 2012-11-09: 15 mg via INTRAVENOUS
  Filled 2012-11-09: qty 5

## 2012-11-09 MED ORDER — ONDANSETRON HCL 4 MG/2ML IJ SOLN
4.0000 mg | Freq: Once | INTRAMUSCULAR | Status: AC
  Administered 2012-11-09: 4 mg via INTRAVENOUS
  Filled 2012-11-09: qty 2

## 2012-11-09 NOTE — ED Notes (Signed)
Pt escorted to discharge window. Pt verbalized understanding discharge instructions. In no acute distress.  

## 2012-11-09 NOTE — ED Notes (Signed)
Pt made aware that PA needs to talk to cardiologist before pt can be considered for discharge

## 2012-11-09 NOTE — ED Notes (Signed)
Per PA pt allowed to have fluids

## 2012-11-09 NOTE — ED Notes (Signed)
Pt has hx of afib. Reports heart has been fluttering since Monday. Reports left sided chest pain started last night, 8/10. Vomited once last night. Nausea at present.

## 2012-11-09 NOTE — ED Notes (Signed)
PA at bedside.

## 2012-11-09 NOTE — ED Provider Notes (Signed)
History     CSN: [id number]  Arrival date & time 11/09/12  1036   First MD Initiated Contact with Patient 11/09/12 1045      Chief Complaint  Patient presents with  . hx afib HR 145     (Consider location/radiation/quality/duration/timing/severity/associated sxs/prior treatment) HPI Comments: Pt with PMH significant for HTN, HLD, DM, and paroxysmal AFIB presents to the ED for palpitations and nausea since Monday.  Contacted her PCP this morning who encouraged her to be evaluated in the ED.  Pt reports that palpitations are exacerbated by movement and exertion.  Pt is not beginning to experience some nausea and R sided abdominal pain radiating into her right thigh- pt feels this is due to her physical therapy.  Pt denies any SOB, dizziness, vomiting, diarrhea, or dysuria.  AFIB controlled with [name].  Prior cardiac cath and echo in 2012 without evidence of blockage or valve dysfunction.  Pt not currently anti-coagulated.  The history is provided by the patient.    Past Medical History  Diagnosis Date  . Diabetes mellitus     type 2  . Hypertension   . Hyperlipidemia   . LVH (left ventricular hypertrophy)   . Arthritis   . Colon polyps   . Diverticulosis   . Gallstones   . IBS (irritable bowel syndrome)   . Kidney stones   . Myocardial infarction   . Anginal pain     OCT 2013  . Paroxysmal a-fib     coumadin ( Chads2=2) Echo 09/05/10 EF 55-60%; mod LVH  . Depression   . 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 Sarcoidosis of lung     NONACTIVE      SEES DR ZHYQ  . Seizures     YEARS AGO NONE SINCE TEENS  . Chronic headaches   . Fibromyalgia   . Peripheral neuropathy     Past Surgical History  Procedure Laterality Date  . Left ankle fracture      closed reduction March 2008  . Wrist arthroscopy      June 2003  . Carpal tunnel release  2003    bilateral  . Subcutaneous transposition of left ulnar nerve    . Abdominal hysterectomy    . Cholecystectomy    . Elbow surgery     bilateral  . Esophageal manometry  03/21/2012    Procedure: ESOPHAGEAL MANOMETRY (EM);  Surgeon: [name], MD;  Location: [facility];  Service: Endoscopy;  Laterality: N/A;  . Colon surgery    . Anterior cervical decomp/discectomy fusion  07/11/2012    Procedure: ANTERIOR CERVICAL DECOMPRESSION/DISCECTOMY FUSION 1 LEVEL;  Surgeon: [name], MD;  Location: [facility];  Service: Neurosurgery;  Laterality: N/A;  Cervical Five-Six Anterior Cervical Decompression with Fusion Interbody Prothesis Plating and Bonegraft    Family History  Problem Relation Age of Onset  . Heart attack Mother     @ age 34  . Heart attack Brother     @ age 42  . Colon cancer Maternal Aunt   . Mental illness Mother   . Prostate cancer Maternal Grandfather   . Ovarian cancer Maternal Aunt     and cousin  . Diabetes Mother   . Diabetes      siblings  . Hypertension Mother     siblings  . Lupus Sister     History  Substance Use Topics  . Smoking status: Never Smoker   . Smokeless tobacco: Never Used  .  Alcohol Use: No    OB History   Grav Para Term Preterm Abortions TAB SAB Ect Mult Living                  Review of Systems  Cardiovascular: Positive for chest pain and palpitations.  Gastrointestinal: Positive for nausea.  All other systems reviewed and are negative.    Allergies  Amitriptyline; Hydromorphone hcl; and Prednisone  Home Medications   Current Outpatient Rx  Name  Route  Sig  Dispense  Refill  . albuterol (PROVENTIL HFA;VENTOLIN HFA) 108 (90 BASE) MCG/ACT inhaler   Inhalation   Inhale 2 puffs into the lungs every 4 (four) hours as needed for wheezing or shortness of breath.         Marland Kitchen aspirin EC 81 MG tablet   Oral   Take 81 mg by mouth daily.         Marland Kitchen atorvastatin (LIPITOR) 10 MG tablet   Oral   Take 10 mg by mouth daily.         . budesonide-formoterol (SYMBICORT) 80-4.5 MCG/ACT inhaler   Inhalation   Inhale 2 puffs into the lungs 2 (two) times  daily.         . calcium carbonate (OS-CAL) 600 MG TABS   Oral   Take 1 tablet (600 mg total) by mouth daily.         . carvedilol (COREG) 12.5 MG tablet   Oral   Take 1 tablet (12.5 mg total) by mouth 2 (two) times daily.   180 tablet   3   . cephALEXin (KEFLEX) 500 MG capsule   Oral   Take 1 capsule (500 mg total) by mouth 4 (four) times daily.   28 capsule   0   . cholecalciferol (VITAMIN D) 1000 UNITS tablet   Oral   Take 1,000 Units by mouth 2 (two) times daily.          . clonazePAM (KLONOPIN) 0.5 MG tablet   Oral   Take 0.5 mg by mouth at bedtime.         . Cranberry 500 MG CAPS   Oral   Take 500 mg by mouth 2 (two) times daily.         Marland Kitchen docusate sodium 100 MG CAPS   Oral   Take 100 mg by mouth 2 (two) times daily.   60 capsule   1   . dofetilide ([name]) 250 MCG capsule   Oral   Take 1 capsule (250 mcg total) by mouth 2 (two) times daily.   60 capsule   1   . DULoxetine (CYMBALTA) 60 MG capsule   Oral   Take 60 mg by mouth daily.         Marland Kitchen esomeprazole (NEXIUM) 40 MG capsule   Oral   Take 40 mg by mouth 2 (two) times daily.         Marland Kitchen EXPIRED: ferrous sulfate 325 (65 FE) MG tablet   Oral   Take 1 tablet (325 mg total) by mouth daily.         Marland Kitchen gabapentin (NEURONTIN) 600 MG tablet   Oral   Take 600 mg by mouth 3 (three) times daily.         . insulin glargine (LANTUS) 100 UNIT/ML injection   Subcutaneous   Inject 40 Units into the skin at bedtime.         . levETIRAcetam (KEPPRA) 500 MG tablet   Oral   Take  500 mg by mouth every 12 (twelve) hours.         . Liraglutide (VICTOZA) 18 MG/3ML SOLN   Subcutaneous   Inject 1.8 mg into the skin daily.         Marland Kitchen loratadine (CLARITIN) 10 MG tablet   Oral   Take 10 mg by mouth every morning.         . metFORMIN (GLUCOPHAGE) 500 MG tablet   Oral   Take 500 mg by mouth 2 (two) times daily with a meal.         . niacin (NIASPAN) 500 MG CR tablet   Oral   Take 500  mg by mouth at bedtime.          Marland Kitchen NITROSTAT 0.4 MG SL tablet      place 1 tablet under the tongue if needed every 5 minutes for chest pain for 3 doses IF NO RELIEF AFTER 3RD DOSE CALL PRESCRIBER OR 911.   25 each   12   . potassium chloride (K-DUR,KLOR-CON) 10 MEQ tablet   Oral   Take 1 tablet (10 mEq total) by mouth daily.   30 tablet   11   . rizatriptan (MAXALT) 10 MG tablet   Oral   Take 10 mg by mouth as needed. For migraines. May repeat in 2 hours if needed.           BP 137/71  Pulse 101  Temp(Src) 98.4 F (36.9 C) (Oral)  Resp 18  SpO2 99%  Physical Exam  Nursing note and vitals reviewed. Constitutional: She is oriented to person, place, and time. She appears well-developed and well-nourished.  HENT:  Head: Normocephalic and atraumatic.  Mouth/Throat: Oropharynx is clear and moist.  Eyes: Conjunctivae and EOM are normal. Pupils are equal, round, and reactive to light.  Neck: Normal range of motion.  Cardiovascular: Normal heart sounds.  An irregularly irregular rhythm present. Tachycardia present.   Pulmonary/Chest: Effort normal and breath sounds normal.  Abdominal: Soft. Bowel sounds are normal. There is no tenderness. There is no guarding, no CVA tenderness, no tenderness at McBurney's point and negative Murphy's sign.  Pain in RLQ but no true TTP  Musculoskeletal: Normal range of motion. She exhibits no edema.  Neurological: She is alert and oriented to person, place, and time. She has normal strength. No cranial nerve deficit or sensory deficit.  Skin: Skin is warm and dry.  Psychiatric: She has a normal mood and affect.    ED Course  Procedures (including critical care time)   Date: 11/09/2012  Rate: 138  Rhythm: atrial fibrillation  QRS Axis: normal  Intervals: normal  ST/T Wave abnormalities: normal  Conduction Disutrbances:none  Narrative Interpretation: abnormal EKG  Old EKG Reviewed: changes noted    Labs Reviewed  CBC WITH  DIFFERENTIAL - Abnormal; Notable for the following:    Neutrophils Relative 38 (*)    Neutro Abs 1.5 (*)    Lymphocytes Relative 53 (*)    All other components within normal limits  URINALYSIS, ROUTINE W REFLEX MICROSCOPIC - Abnormal; Notable for the following:    Leukocytes, UA SMALL (*)    All other components within normal limits  GLUCOSE, CAPILLARY - Abnormal; Notable for the following:    Glucose-Capillary 104 (*)    All other components within normal limits  BASIC METABOLIC PANEL - Abnormal; Notable for the following:    Creatinine, Ser 1.15 (*)    GFR calc non Af Amer 51 (*)  GFR calc Af Amer 59 (*)    All other components within normal limits  URINE MICROSCOPIC-ADD ON - Abnormal; Notable for the following:    Squamous Epithelial / LPF FEW (*)    Bacteria, UA FEW (*)    All other components within normal limits  URINE CULTURE  POCT I-STAT TROPONIN I   Dg Chest 2 View  11/09/2012  *RADIOLOGY REPORT*  Clinical Data: Shortness of breath and chest pain.  Dizziness.  CHEST - 2 VIEW  Comparison: 08/25/2012  Findings: The heart size and mediastinal contours are within normal limits.  Both lungs are clear. Similar appearance of the prominent hilar markings.  The visualized skeletal structures are unremarkable.  IMPRESSION: 1.  No acute cardiopulmonary abnormalities.   Original Report Authenticated By: Signa Kell, M.D.      1. Paroxysmal a-fib   2. Palpitations     Medications  0.9 %  sodium chloride infusion ( Intravenous Stopped 11/09/12 1450)  diltiazem (CARDIZEM) injection 15 mg (0 mg Intravenous Stopped 11/09/12 1135)  ondansetron (ZOFRAN) injection 4 mg (4 mg Intravenous Given 11/09/12 1119)  ondansetron (ZOFRAN) injection 4 mg (4 mg Intravenous Given 11/09/12 1345)  fentaNYL (SUBLIMAZE) injection 50 mcg (50 mcg Intravenous Given 11/09/12 1345)      MDM   Pt presenting to the ED for palpitations and nausea x 3 days.  Upon arrival to ED, pt is in active AFIB.  Rhythm  and HR quickly corrected with bolus of 15mg  diltiazem.  HR stable, avg 75, and in NSR the remainder of ED visit.  Consulted [facility]- Ward Givens.  Advised pt is ok for discharge and to continue [name] 250mg  BID but increase Coreg from 12.5mg  BID to 18.75mg  BID and has scheduled her a follow up appointment on April 2.  VS stable at time of d/c.  Discussed plan with pt and she acknowledged understanding.  Return precautions advised.        Garlon Hatchet, PA-C 11/09/12 1638  Garlon Hatchet, PA-C 11/09/12 1709  Garlon Hatchet, PA-C 11/09/12 1709

## 2012-11-09 NOTE — ED Notes (Signed)
Pt back from x-ray.

## 2012-11-10 LAB — URINE CULTURE: Colony Count: 9000

## 2012-11-10 NOTE — ED Provider Notes (Signed)
Shared service with midlevel provider. I have personally seen and examined the patient, providing direct face to face care, presenting with the chief complaint of palpitations. Physical exam findings include afib with RVR - HR in the 140s. Patient responded to iv cardiazem bolus, advised Misty Stanley to cal the Cardiologist and get their recs. I have reviewed the nursing documentation on past medical history, family history, and social history.  Derwood Kaplan, MD 11/10/12 [phone]

## 2012-11-11 ENCOUNTER — Telehealth: Payer: Self-pay | Admitting: *Deleted

## 2012-11-11 MED ORDER — DOFETILIDE 250 MCG PO CAPS: 250.0000 ug | ORAL_CAPSULE | Freq: Two times a day (BID) | ORAL | Status: DC

## 2012-11-11 NOTE — Telephone Encounter (Signed)
CALLED CO ONCE AGAIN NOW STATES THEY NED WRITTEN SCRIPT FAXED WITH OTHER INFO  RESENT PER REQUEST .Zack Seal

## 2012-11-11 NOTE — Telephone Encounter (Signed)
ERROR

## 2012-11-14 ENCOUNTER — Emergency Department (HOSPITAL_COMMUNITY)
Admission: EM | Admit: 2012-11-14 | Discharge: 2012-11-14 | Disposition: A | Discharge status: Home / Self Care | Payer: Medicaid Other | Attending: Emergency Medicine | Admitting: Emergency Medicine

## 2012-11-14 ENCOUNTER — Encounter (HOSPITAL_COMMUNITY): Payer: Self-pay | Admitting: Emergency Medicine

## 2012-11-14 DIAGNOSIS — N12 Tubulo-interstitial nephritis, not specified as acute or chronic: Secondary | ICD-10-CM

## 2012-11-14 DIAGNOSIS — Z860100 Personal history of colon polyps, unspecified: Secondary | ICD-10-CM | POA: Insufficient documentation

## 2012-11-14 DIAGNOSIS — Z8619 Personal history of other infectious and parasitic diseases: Secondary | ICD-10-CM | POA: Insufficient documentation

## 2012-11-14 DIAGNOSIS — R109 Unspecified abdominal pain: Secondary | ICD-10-CM | POA: Insufficient documentation

## 2012-11-14 DIAGNOSIS — Z8739 Personal history of other diseases of the musculoskeletal system and connective tissue: Secondary | ICD-10-CM | POA: Insufficient documentation

## 2012-11-14 DIAGNOSIS — Z87442 Personal history of urinary calculi: Secondary | ICD-10-CM | POA: Insufficient documentation

## 2012-11-14 DIAGNOSIS — Z7982 Long term (current) use of aspirin: Secondary | ICD-10-CM | POA: Insufficient documentation

## 2012-11-14 DIAGNOSIS — F3289 Other specified depressive episodes: Secondary | ICD-10-CM | POA: Insufficient documentation

## 2012-11-14 DIAGNOSIS — R197 Diarrhea, unspecified: Secondary | ICD-10-CM | POA: Insufficient documentation

## 2012-11-14 DIAGNOSIS — I252 Old myocardial infarction: Secondary | ICD-10-CM | POA: Insufficient documentation

## 2012-11-14 DIAGNOSIS — I10 Essential (primary) hypertension: Secondary | ICD-10-CM | POA: Insufficient documentation

## 2012-11-14 DIAGNOSIS — R630 Anorexia: Secondary | ICD-10-CM | POA: Insufficient documentation

## 2012-11-14 DIAGNOSIS — E785 Hyperlipidemia, unspecified: Secondary | ICD-10-CM | POA: Insufficient documentation

## 2012-11-14 DIAGNOSIS — Z8709 Personal history of other diseases of the respiratory system: Secondary | ICD-10-CM | POA: Insufficient documentation

## 2012-11-14 DIAGNOSIS — R112 Nausea with vomiting, unspecified: Secondary | ICD-10-CM | POA: Insufficient documentation

## 2012-11-14 DIAGNOSIS — F329 Major depressive disorder, single episode, unspecified: Secondary | ICD-10-CM | POA: Insufficient documentation

## 2012-11-14 DIAGNOSIS — Z9889 Other specified postprocedural states: Secondary | ICD-10-CM | POA: Insufficient documentation

## 2012-11-14 DIAGNOSIS — Z79899 Other long term (current) drug therapy: Secondary | ICD-10-CM | POA: Insufficient documentation

## 2012-11-14 DIAGNOSIS — J45909 Unspecified asthma, uncomplicated: Secondary | ICD-10-CM | POA: Insufficient documentation

## 2012-11-14 DIAGNOSIS — Z8679 Personal history of other diseases of the circulatory system: Secondary | ICD-10-CM | POA: Insufficient documentation

## 2012-11-14 DIAGNOSIS — E119 Type 2 diabetes mellitus without complications: Secondary | ICD-10-CM | POA: Insufficient documentation

## 2012-11-14 DIAGNOSIS — Z8669 Personal history of other diseases of the nervous system and sense organs: Secondary | ICD-10-CM | POA: Insufficient documentation

## 2012-11-14 DIAGNOSIS — Z8719 Personal history of other diseases of the digestive system: Secondary | ICD-10-CM | POA: Insufficient documentation

## 2012-11-14 DIAGNOSIS — Z90710 Acquired absence of both cervix and uterus: Secondary | ICD-10-CM | POA: Insufficient documentation

## 2012-11-14 DIAGNOSIS — Z794 Long term (current) use of insulin: Secondary | ICD-10-CM | POA: Insufficient documentation

## 2012-11-14 DIAGNOSIS — R350 Frequency of micturition: Secondary | ICD-10-CM | POA: Insufficient documentation

## 2012-11-14 DIAGNOSIS — R6883 Chills (without fever): Secondary | ICD-10-CM | POA: Insufficient documentation

## 2012-11-14 LAB — COMPREHENSIVE METABOLIC PANEL
AST: 25 U/L (ref 0–37)
Albumin: 4.2 g/dL (ref 3.5–5.2)
Calcium: 9.9 mg/dL (ref 8.4–10.5)
Creatinine, Ser: 1.12 mg/dL — ABNORMAL HIGH (ref 0.50–1.10)
Sodium: 142 mEq/L (ref 135–145)
Total Protein: 7.8 g/dL (ref 6.0–8.3)

## 2012-11-14 LAB — URINALYSIS, MICROSCOPIC ONLY
Glucose, UA: NEGATIVE mg/dL
Protein, ur: 30 mg/dL — AB
Specific Gravity, Urine: 1.04 — ABNORMAL HIGH (ref 1.005–1.030)
pH: 5 (ref 5.0–8.0)

## 2012-11-14 LAB — CBC WITH DIFFERENTIAL/PLATELET
Basophils Absolute: 0 10*3/uL (ref 0.0–0.1)
Basophils Relative: 0 % (ref 0–1)
Eosinophils Relative: 2 % (ref 0–5)
HCT: 37.7 % (ref 36.0–46.0)
MCHC: 34.2 g/dL (ref 30.0–36.0)
MCV: 87.1 fL (ref 78.0–100.0)
Monocytes Absolute: 0.3 10*3/uL (ref 0.1–1.0)
Neutro Abs: 3.3 10*3/uL (ref 1.7–7.7)
Platelets: 180 10*3/uL (ref 150–400)
RDW: 13.6 % (ref 11.5–15.5)

## 2012-11-14 MED ORDER — KETOROLAC TROMETHAMINE 30 MG/ML IJ SOLN
30.0000 mg | Freq: Once | INTRAMUSCULAR | Status: AC
  Administered 2012-11-14: 30 mg via INTRAMUSCULAR
  Filled 2012-11-14: qty 1

## 2012-11-14 MED ORDER — HYDROCODONE-ACETAMINOPHEN 5-325 MG PO TABS: 1.0000 | ORAL_TABLET | ORAL | Status: DC | PRN

## 2012-11-14 MED ORDER — GI COCKTAIL ~~LOC~~
30.0000 mL | Freq: Once | ORAL | Status: AC
  Administered 2012-11-14: 30 mL via ORAL
  Filled 2012-11-14: qty 30

## 2012-11-14 MED ORDER — ONDANSETRON HCL 4 MG/2ML IJ SOLN
4.0000 mg | Freq: Once | INTRAMUSCULAR | Status: AC
  Administered 2012-11-14: 4 mg via INTRAVENOUS
  Filled 2012-11-14: qty 2

## 2012-11-14 MED ORDER — DEXTROSE 5 % IV SOLN
1.0000 g | INTRAVENOUS | Status: DC
  Administered 2012-11-14: 1 g via INTRAVENOUS
  Filled 2012-11-14: qty 10

## 2012-11-14 MED ORDER — MORPHINE SULFATE 2 MG/ML IJ SOLN
2.0000 mg | Freq: Once | INTRAMUSCULAR | Status: AC
  Administered 2012-11-14: 2 mg via INTRAVENOUS
  Filled 2012-11-14: qty 1

## 2012-11-14 MED ORDER — OXYCODONE-ACETAMINOPHEN 5-325 MG PO TABS: 1.0000 | ORAL_TABLET | ORAL | Status: DC | PRN

## 2012-11-14 MED ORDER — SULFAMETHOXAZOLE-TRIMETHOPRIM 800-160 MG PO TABS: 1.0000 | ORAL_TABLET | Freq: Two times a day (BID) | ORAL | Status: DC

## 2012-11-14 MED ORDER — OXYCODONE-ACETAMINOPHEN 5-325 MG PO TABS: 2.0000 | ORAL_TABLET | Freq: Once | ORAL | Status: DC

## 2012-11-14 MED ORDER — PANTOPRAZOLE SODIUM 40 MG IV SOLR
40.0000 mg | Freq: Once | INTRAVENOUS | Status: AC
  Administered 2012-11-14: 40 mg via INTRAVENOUS
  Filled 2012-11-14: qty 40

## 2012-11-14 MED ORDER — ONDANSETRON 8 MG PO TBDP: ORAL_TABLET | ORAL | Status: DC

## 2012-11-14 MED ORDER — SODIUM CHLORIDE 0.9 % IV BOLUS (SEPSIS)
1000.0000 mL | Freq: Once | INTRAVENOUS | Status: AC
  Administered 2012-11-14: 1000 mL via INTRAVENOUS

## 2012-11-14 NOTE — ED Notes (Signed)
Pt c/o headache, n/v/d x 3 days  

## 2012-11-14 NOTE — ED Provider Notes (Signed)
History     CSN: [id number]  Arrival date & time 11/14/12  0033   First MD Initiated Contact with Patient 11/14/12 [phone]      Chief Complaint  Patient presents with  . Headache  . Emesis  . Diarrhea   HPI  History provided by the patient and significant other. Patient is a 62 year old female with past history of hypertension, hyper lipidemia, diabetes, IBS, paroxysmal A. fib who presents with complaints of nausea, vomiting diarrhea symptoms. Patient reports having slight soft diarrhea type stools for the past 3 days. She was having occasional abdominal discomforts but these were brief period yesterday evening around 11 PM she began feeling much worse with increased lower abdominal discomfort radiating into her right side and flank. This was followed by episodes of nausea and vomiting. Patient is not use any treatment for symptoms and denies any other aggravating or alleviating factors. She was seen recently in the emergency room for an episode of paroxysmal A. fib which was converted. She has felt well without any similar palpitations. Denies any chest pain or shortness of breath. Patient also mentions history of a UTI infection one month ago that was improved after a week of antibiotics. Denies any specific known sick contacts. No other recent travel.    Past Medical History  Diagnosis Date  . Diabetes mellitus     type 2  . Hypertension   . Hyperlipidemia   . LVH (left ventricular hypertrophy)   . Arthritis   . Colon polyps   . Diverticulosis   . Gallstones   . IBS (irritable bowel syndrome)   . Kidney stones   . Myocardial infarction   . Anginal pain     OCT 2013  . Paroxysmal a-fib     coumadin ( Chads2=2) Echo 09/05/10 EF 55-60%; mod LVH  . Depression   . 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 Sarcoidosis of lung     NONACTIVE      SEES DR JXBJ  . Seizures     YEARS AGO NONE SINCE TEENS  . Chronic headaches   . Fibromyalgia   . Peripheral neuropathy     Past Surgical History    Procedure Laterality Date  . Left ankle fracture      closed reduction March 2008  . Wrist arthroscopy      June 2003  . Carpal tunnel release  2003    bilateral  . Subcutaneous transposition of left ulnar nerve    . Abdominal hysterectomy    . Cholecystectomy    . Elbow surgery      bilateral  . Esophageal manometry  03/21/2012    Procedure: ESOPHAGEAL MANOMETRY (EM);  Surgeon: [name], MD;  Location: [facility];  Service: Endoscopy;  Laterality: N/A;  . Colon surgery    . Anterior cervical decomp/discectomy fusion  07/11/2012    Procedure: ANTERIOR CERVICAL DECOMPRESSION/DISCECTOMY FUSION 1 LEVEL;  Surgeon: [name], MD;  Location: [facility];  Service: Neurosurgery;  Laterality: N/A;  Cervical Five-Six Anterior Cervical Decompression with Fusion Interbody Prothesis Plating and Bonegraft    Family History  Problem Relation Age of Onset  . Heart attack Mother     @ age 30  . Heart attack Brother     @ age 56  . Colon cancer Maternal Aunt   . Mental illness Mother   . Prostate cancer Maternal Grandfather   . Ovarian cancer Maternal Aunt     and cousin  .  Diabetes Mother   . Diabetes      siblings  . Hypertension Mother     siblings  . Lupus Sister     History  Substance Use Topics  . Smoking status: Never Smoker   . Smokeless tobacco: Never Used  . Alcohol Use: No    OB History   Grav Para Term Preterm Abortions TAB SAB Ect Mult Living                  Review of Systems  Constitutional: Positive for chills and appetite change. Negative for fever.  Respiratory: Negative for cough and shortness of breath.   Cardiovascular: Negative for chest pain.  Gastrointestinal: Positive for nausea, vomiting, abdominal pain and diarrhea.  Genitourinary: Positive for frequency and flank pain. Negative for dysuria and hematuria.  All other systems reviewed and are negative.    Allergies  Amitriptyline; Hydromorphone hcl; and Prednisone  Home  Medications   Current Outpatient Rx  Name  Route  Sig  Dispense  Refill  . albuterol (PROVENTIL HFA;VENTOLIN HFA) 108 (90 BASE) MCG/ACT inhaler   Inhalation   Inhale 2 puffs into the lungs every 4 (four) hours as needed for wheezing or shortness of breath.         Marland Kitchen aspirin EC 81 MG tablet   Oral   Take 81 mg by mouth daily.         Marland Kitchen atorvastatin (LIPITOR) 10 MG tablet   Oral   Take 10 mg by mouth daily.         . budesonide-formoterol (SYMBICORT) 80-4.5 MCG/ACT inhaler   Inhalation   Inhale 2 puffs into the lungs 2 (two) times daily.         . calcium carbonate (OS-CAL) 600 MG TABS   Oral   Take 1 tablet (600 mg total) by mouth daily.         . carvedilol (COREG) 12.5 MG tablet   Oral   Take 37.5 mg by mouth 2 (two) times daily. Takes 1.5 tabs of the 12.5mg  = 37.5         . cholecalciferol (VITAMIN D) 1000 UNITS tablet   Oral   Take 1,000 Units by mouth 2 (two) times daily.          . clonazePAM (KLONOPIN) 0.5 MG tablet   Oral   Take 0.5 mg by mouth at bedtime.         . Cranberry 500 MG CAPS   Oral   Take 500 mg by mouth 2 (two) times daily.         Marland Kitchen docusate sodium 100 MG CAPS   Oral   Take 100 mg by mouth 2 (two) times daily.   60 capsule   1   . dofetilide ([name]) 250 MCG capsule   Oral   Take 1 capsule (250 mcg total) by mouth 2 (two) times daily.   180 capsule   4   . DULoxetine (CYMBALTA) 60 MG capsule   Oral   Take 60 mg by mouth daily.         Marland Kitchen esomeprazole (NEXIUM) 40 MG capsule   Oral   Take 40 mg by mouth 2 (two) times daily.         Marland Kitchen gabapentin (NEURONTIN) 600 MG tablet   Oral   Take 600 mg by mouth 3 (three) times daily.         . insulin glargine (LANTUS) 100 UNIT/ML injection   Subcutaneous   Inject  40 Units into the skin at bedtime.         . levETIRAcetam (KEPPRA) 500 MG tablet   Oral   Take 500 mg by mouth every 12 (twelve) hours.         . Liraglutide (VICTOZA) 18 MG/3ML SOLN    Subcutaneous   Inject 1.8 mg into the skin daily.         Marland Kitchen loratadine (CLARITIN) 10 MG tablet   Oral   Take 10 mg by mouth every morning.         . metFORMIN (GLUCOPHAGE) 500 MG tablet   Oral   Take 500 mg by mouth 2 (two) times daily with a meal.         . niacin (NIASPAN) 500 MG CR tablet   Oral   Take 500 mg by mouth at bedtime.          . potassium chloride (K-DUR,KLOR-CON) 10 MEQ tablet   Oral   Take 1 tablet (10 mEq total) by mouth daily.   30 tablet   11   . rizatriptan (MAXALT) 10 MG tablet   Oral   Take 10 mg by mouth as needed. For migraines. May repeat in 2 hours if needed.         Marland Kitchen EXPIRED: ferrous sulfate 325 (65 FE) MG tablet   Oral   Take 1 tablet (325 mg total) by mouth daily.         Marland Kitchen NITROSTAT 0.4 MG SL tablet      place 1 tablet under the tongue if needed every 5 minutes for chest pain for 3 doses IF NO RELIEF AFTER 3RD DOSE CALL PRESCRIBER OR 911.   25 each   12     BP 127/80  Pulse 75  Temp(Src) 97.5 F (36.4 C) (Oral)  Resp 18  SpO2 100%  Physical Exam  Nursing note and vitals reviewed. Constitutional: She is oriented to person, place, and time. She appears well-developed and well-nourished. No distress.  HENT:  Head: Normocephalic.  Mouth/Throat: Oropharynx is clear and moist.  Cardiovascular: Normal rate and regular rhythm.   Pulmonary/Chest: Effort normal and breath sounds normal. No respiratory distress.  Abdominal: Soft. There is tenderness in the right lower quadrant. There is no rebound and no guarding.    Some discomfort around the right flank without clear CVA tenderness  Musculoskeletal: Normal range of motion.  Neurological: She is alert and oriented to person, place, and time.  Skin: Skin is warm and dry. No rash noted.  Psychiatric: She has a normal mood and affect. Her behavior is normal.    ED Course  Procedures   Results for orders placed during the hospital encounter of 11/14/12  CBC WITH  DIFFERENTIAL      Result Value Range   WBC 5.0  4.0 - 10.5 K/uL   RBC 4.33  3.87 - 5.11 MIL/uL   Hemoglobin 12.9  12.0 - 15.0 g/dL   HCT 16.1  09.6 - 04.5 %   MCV 87.1  78.0 - 100.0 fL   MCH 29.8  26.0 - 34.0 pg   MCHC 34.2  30.0 - 36.0 g/dL   RDW 40.9  81.1 - 91.4 %   Platelets 180  150 - 400 K/uL   Neutrophils Relative 66  43 - 77 %   Neutro Abs 3.3  1.7 - 7.7 K/uL   Lymphocytes Relative 26  12 - 46 %   Lymphs Abs 1.3  0.7 - 4.0 K/uL  Monocytes Relative 6  3 - 12 %   Monocytes Absolute 0.3  0.1 - 1.0 K/uL   Eosinophils Relative 2  0 - 5 %   Eosinophils Absolute 0.1  0.0 - 0.7 K/uL   Basophils Relative 0  0 - 1 %   Basophils Absolute 0.0  0.0 - 0.1 K/uL  COMPREHENSIVE METABOLIC PANEL      Result Value Range   Sodium 142  135 - 145 mEq/L   Potassium 3.6  3.5 - 5.1 mEq/L   Chloride 103  96 - 112 mEq/L   CO2 28  19 - 32 mEq/L   Glucose, Bld 114 (*) 70 - 99 mg/dL   BUN 10  6 - 23 mg/dL   Creatinine, Ser 4.09 (*) 0.50 - 1.10 mg/dL   Calcium 9.9  8.4 - 81.1 mg/dL   Total Protein 7.8  6.0 - 8.3 g/dL   Albumin 4.2  3.5 - 5.2 g/dL   AST 25  0 - 37 U/L   ALT 19  0 - 35 U/L   Alkaline Phosphatase 96  39 - 117 U/L   Total Bilirubin 0.3  0.3 - 1.2 mg/dL   GFR calc non Af Amer 52 (*) >90 mL/min   GFR calc Af Amer 61 (*) >90 mL/min  LIPASE, BLOOD      Result Value Range   Lipase 52  11 - 59 U/L  URINALYSIS, MICROSCOPIC ONLY      Result Value Range   Color, Urine YELLOW  YELLOW   APPearance CLOUDY (*) CLEAR   Specific Gravity, Urine 1.040 (*) 1.005 - 1.030   pH 5.0  5.0 - 8.0   Glucose, UA NEGATIVE  NEGATIVE mg/dL   Hgb urine dipstick TRACE (*) NEGATIVE   Bilirubin Urine NEGATIVE  NEGATIVE   Ketones, ur NEGATIVE  NEGATIVE mg/dL   Protein, ur 30 (*) NEGATIVE mg/dL   Urobilinogen, UA 0.2  0.0 - 1.0 mg/dL   Nitrite NEGATIVE  NEGATIVE   Leukocytes, UA MODERATE (*) NEGATIVE   WBC, UA TOO NUMEROUS TO COUNT  <3 WBC/hpf   RBC / HPF 0-2  <3 RBC/hpf   Bacteria, UA MANY (*) RARE    Squamous Epithelial / LPF FEW (*) RARE   Crystals CA OXALATE CRYSTALS (*) NEGATIVE       1. Pyelonephritis       MDM  Patient seen and evaluated. Patient appears uncomfortable but in no acute distress.  UA was signs of significant UTI. Next day also be concerning for possible early pyelonephritis given her right side and flank discomfort. No significant CVA tenderness. Normal WBC  Patient feeling better after fluids and medications. Pain improved. Tolerating by mouth fluids without nausea vomiting. At this time we'll discharge home with outpatient treatment. Patient given strict return precautions.      Angus Seller, PA-C 11/14/12 [phone]

## 2012-11-14 NOTE — ED Provider Notes (Signed)
Medical screening examination/treatment/procedure(s) were performed by non-physician practitioner and as supervising physician I was immediately available for consultation/collaboration.  Tandi Hanko M Lecretia Buczek, MD 11/14/12 0824 

## 2012-11-15 ENCOUNTER — Encounter: Payer: Medicaid Other | Admitting: Physical Therapy

## 2012-11-15 LAB — URINE CULTURE

## 2012-11-16 ENCOUNTER — Ambulatory Visit (INDEPENDENT_AMBULATORY_CARE_PROVIDER_SITE_OTHER): Payer: Medicaid Other | Admitting: Physician Assistant

## 2012-11-16 ENCOUNTER — Encounter: Payer: Self-pay | Admitting: Physician Assistant

## 2012-11-16 VITALS — BP 138/82 | HR 68 | Ht 67.0 in | Wt 194.0 lb

## 2012-11-16 DIAGNOSIS — I10 Essential (primary) hypertension: Secondary | ICD-10-CM

## 2012-11-16 DIAGNOSIS — J45909 Unspecified asthma, uncomplicated: Secondary | ICD-10-CM

## 2012-11-16 DIAGNOSIS — R079 Chest pain, unspecified: Secondary | ICD-10-CM

## 2012-11-16 DIAGNOSIS — I4891 Unspecified atrial fibrillation: Secondary | ICD-10-CM

## 2012-11-16 MED ORDER — CARVEDILOL 12.5 MG PO TABS: ORAL_TABLET | ORAL | Status: DC

## 2012-11-16 NOTE — Assessment & Plan Note (Signed)
Patient's asthma seems to be acting up although her lungs are clear today. I asked her to follow up with her primary care.

## 2012-11-16 NOTE — Assessment & Plan Note (Signed)
Patient had recent emergency room visit with paroxysmal atrial fibrillation and converted to normal sinus rhythm. They increased her Coreg to 18.75 mg b.i.d. She does have a long QT. She is using her albuterol inhaler more frequently because of asthma. This may be contributing to her atrial fibrillation. I've asked her to discuss possible alternative inhalers with her primary care. I will decrease her Coreg to 18.75 in the Am, 12 .5 in the evening. She will see Dr. [name] back in followup in one month.

## 2012-11-16 NOTE — Patient Instructions (Addendum)
**Note De-Identified Monasia Lair Obfuscation** Your physician has recommended you make the following change in your medication: decrease Carvedilol to 1 and 1/2 tablet in the mornings and 1 tablet in evening  Your physician recommends that you schedule a follow-up appointment in: 1 month

## 2012-11-16 NOTE — Progress Notes (Signed)
HPI: This is a 62 year old African American female patient of Dr. Ocie Bob, Dr. Charlton Haws, and Dr. Lovell Sheehan, neurology. She has a history of paroxysmal atrial fibrillation treated with [name] since 2011. Cardiac catheterization in 5/12 demonstrated normal coronary arteries. Last 2-D echo ejection fraction was 60-65% with mild LVH and mild atrial enlargement. She has chronic asthma and a questionable diagnosis of sarcoid. She also has a history of orthostatic hypotension.  The patient was recently in the emergency room for paroxysmal atrial fibrillation on 11/09/12. Her Coreg was increased to one half tablets b.i.d. She also had an emergency room visit in January 2014 with chest pain and ruled out for an MI. Her potassium was 3.3 at that time. She was in the emergency room again last week with a diagnosis of pyelonephritis.  The patient comes in today feeling like her asthma is acting up. She is a low short of breath. She was sent to rehabilitation to work on her balance last week but had trouble completing this. Since her emergency room visit she is not had any further palpitations. Her blood pressure has been up and down her heart rate has been stable. She does have occasional dizziness when she stands up quickly. She denies chest pain, orthopnea, edema, or syncope.   Allergies: -- Amitriptyline -- Other (See Comments)   --  Disoriented.  -- Hydromorphone Hcl -- Other (See Comments)   --  blood pressure drops.  -- Prednisone    --  SENT INTO [facility]  Current Outpatient Prescriptions on File Prior to Visit: albuterol (PROVENTIL HFA;VENTOLIN HFA) 108 (90 BASE) MCG/ACT inhaler, Inhale 2 puffs into the lungs every 4 (four) hours as needed for wheezing or shortness of breath., Disp: , Rfl:  aspirin EC 81 MG tablet, Take 81 mg by mouth daily., Disp: , Rfl:  atorvastatin (LIPITOR) 10 MG tablet, Take 10 mg by mouth daily., Disp: , Rfl:  budesonide-formoterol (SYMBICORT) 80-4.5 MCG/ACT inhaler, Inhale 2  puffs into the lungs 2 (two) times daily., Disp: , Rfl:  calcium carbonate (OS-CAL) 600 MG TABS, Take 1 tablet (600 mg total) by mouth daily., Disp: , Rfl:  carvedilol (COREG) 12.5 MG tablet, Take 37.5 mg by mouth 2 (two) times daily. Takes 1.5 tabs of the 12.5mg  = 37.5, Disp: , Rfl:  cholecalciferol (VITAMIN D) 1000 UNITS tablet, Take 1,000 Units by mouth 2 (two) times daily. , Disp: , Rfl:  clonazePAM (KLONOPIN) 0.5 MG tablet, Take 0.5 mg by mouth at bedtime., Disp: , Rfl:  Cranberry 500 MG CAPS, Take 500 mg by mouth 2 (two) times daily., Disp: , Rfl:  docusate sodium 100 MG CAPS, Take 100 mg by mouth 2 (two) times daily., Disp: 60 capsule, Rfl: 1 dofetilide ([name]) 250 MCG capsule, Take 1 capsule (250 mcg total) by mouth 2 (two) times daily., Disp: 180 capsule, Rfl: 4 DULoxetine (CYMBALTA) 60 MG capsule, Take 60 mg by mouth daily., Disp: , Rfl:  esomeprazole (NEXIUM) 40 MG capsule, Take 40 mg by mouth 2 (two) times daily., Disp: , Rfl:  ferrous sulfate 325 (65 FE) MG tablet, Take 1 tablet (325 mg total) by mouth daily., Disp: , Rfl:  gabapentin (NEURONTIN) 600 MG tablet, Take 600 mg by mouth 3 (three) times daily., Disp: , Rfl:  insulin glargine (LANTUS) 100 UNIT/ML injection, Inject 40 Units into the skin at bedtime., Disp: , Rfl:  levETIRAcetam (KEPPRA) 500 MG tablet, Take 500 mg by mouth every 12 (twelve) hours., Disp: , Rfl:  Liraglutide (VICTOZA) 18 MG/3ML SOLN, Inject  1.8 mg into the skin daily., Disp: , Rfl:  loratadine (CLARITIN) 10 MG tablet, Take 10 mg by mouth every morning., Disp: , Rfl:  metFORMIN (GLUCOPHAGE) 500 MG tablet, Take 500 mg by mouth 2 (two) times daily with a meal., Disp: , Rfl:  niacin (NIASPAN) 500 MG CR tablet, Take 500 mg by mouth at bedtime. , Disp: , Rfl:  NITROSTAT 0.4 MG SL tablet, place 1 tablet under the tongue if needed every 5 minutes for chest pain for 3 doses IF NO RELIEF AFTER 3RD DOSE CALL PRESCRIBER OR 911., Disp: 25 each, Rfl: 12 ondansetron (ZOFRAN  ODT) 8 MG disintegrating tablet, 8mg  ODT q4 hours prn nausea, Disp: 20 tablet, Rfl: 0 oxyCODONE-acetaminophen (PERCOCET) 5-325 MG per tablet, Take 1-2 tablets by mouth every 4 (four) hours as needed for pain., Disp: 20 tablet, Rfl: 0 potassium chloride (K-DUR,KLOR-CON) 10 MEQ tablet, Take 1 tablet (10 mEq total) by mouth daily., Disp: 30 tablet, Rfl: 11 rizatriptan (MAXALT) 10 MG tablet, Take 10 mg by mouth as needed. For migraines. May repeat in 2 hours if needed., Disp: , Rfl:  sulfamethoxazole-trimethoprim (SEPTRA DS) 800-160 MG per tablet, Take 1 tablet by mouth 2 (two) times daily., Disp: 20 tablet, Rfl: 0  No current facility-administered medications on file prior to visit.   Past Medical History:   Diabetes mellitus                                              Comment:type 2   Hypertension                                                 Hyperlipidemia                                               LVH (left ventricular hypertrophy)                           Arthritis                                                    Colon polyps                                                 Diverticulosis                                               Gallstones  IBS (irritable bowel syndrome)                               Kidney stones                                                Myocardial infarction                                        Anginal pain                                                   Comment:OCT 2013   Paroxysmal a-fib                                               Comment:coumadin ( Chads2=2) Echo 09/05/10 EF 55-60%;               mod LVH   Depression                                                   Asthma                                                         Comment:DR WERT    Sarcoidosis of lung                                            Comment:NONACTIVE      SEES DR ZOXW   Seizures                                                        Comment:YEARS AGO NONE SINCE TEENS   Chronic headaches                                            Fibromyalgia                                                 Peripheral neuropathy  Past Surgical History:   left ankle fracture                                             Comment:closed reduction March 2008   WRIST ARTHROSCOPY                                               Comment:June 2003   CARPAL TUNNEL RELEASE                            2003           Comment:bilateral   subcutaneous transposition of left ulnar nerve                ABDOMINAL HYSTERECTOMY                                        CHOLECYSTECTOMY                                               ELBOW SURGERY                                                   Comment:bilateral   ESOPHAGEAL MANOMETRY                             03/21/2012       Comment:Procedure: ESOPHAGEAL MANOMETRY (EM);  Surgeon:              [name], MD;  Location: [facility];                Service: Endoscopy;  Laterality: N/A;   COLON SURGERY                                                 ANTERIOR CERVICAL DECOMP/DISCECTOMY FUSION       07/11/2012     Comment:Procedure: ANTERIOR CERVICAL               DECOMPRESSION/DISCECTOMY FUSION 1 LEVEL;                Surgeon: [name], MD;  Location: [facility];  Service: Neurosurgery;  Laterality:              N/A;  Cervical Five-Six Anterior Cervical               Decompression with Fusion Interbody Prothesis               Plating and Bonegraft  Review of patient's family history indicates:   Heart attack  Mother                     Comment: @ age 55   Heart attack                   Brother                    Comment: @ age 98   Colon cancer                   Maternal Aunt            Mental illness                 Mother                   Prostate cancer                Maternal Grandfather     Ovarian  cancer                 Maternal Aunt              Comment: and cousin   Diabetes                       Mother                   Diabetes                                                  Comment: siblings   Hypertension                   Mother                     Comment: siblings   Lupus                          Sister                   Social History   Marital Status: Divorced            Spouse Name:                      Years of Education:                 Number of children: 3           Occupational History Occupation          Associate Professor            Comment              disabled                                CNA  Social History Main Topics   Smoking Status: Never Smoker                     Smokeless Status: Never Used                       Alcohol Use: No  Drug Use: No             Sexual Activity: Not on file        Other Topics            Concern   None on file  Social History Narrative   None on file    ROS:see history of present illness. Cervical neck surgery in November and she is gradually improving.   PHYSICAL EXAM: Well-nournished, in no acute distress. Neck: No JVD, HJR, Bruit, or thyroid enlargement  Lungs: No tachypnea, clear without wheezing, rales, or rhonchi  Cardiovascular: RRR, PMI not displaced,1/6 systolic murmur at the left sternal border, no bruit, thrill, or heave.  Abdomen: BS normal. Soft without organomegaly, masses, lesions or tenderness.  Extremities: without cyanosis, clubbing or edema. Good distal pulses bilateral  SKin: Warm, no lesions or rashes   Musculoskeletal: No deformities  Neuro: no focal signs  BP 138/82  Pulse 68  Ht 5\' 7"  (1.702 m)  Wt 194 lb (87.998 kg)  BMI 30.38 kg/m2    ZOX:WRUEAV sinus rhythm with long QT of 406 QTC of 441 nonspecific ST-T wave changes

## 2012-11-16 NOTE — Assessment & Plan Note (Signed)
Stable today.

## 2012-11-17 ENCOUNTER — Ambulatory Visit: Payer: Medicaid Other | Attending: Anesthesiology | Admitting: Physical Therapy

## 2012-11-17 DIAGNOSIS — IMO0001 Reserved for inherently not codable concepts without codable children: Secondary | ICD-10-CM | POA: Insufficient documentation

## 2012-11-17 DIAGNOSIS — M2550 Pain in unspecified joint: Secondary | ICD-10-CM | POA: Insufficient documentation

## 2012-11-17 DIAGNOSIS — R269 Unspecified abnormalities of gait and mobility: Secondary | ICD-10-CM | POA: Insufficient documentation

## 2012-11-18 NOTE — Telephone Encounter (Signed)
New Prob    Pt wanted to notify nurse she will be picking up her [name] on Monday.

## 2012-11-18 NOTE — Telephone Encounter (Signed)
NOTED ./CY 

## 2012-11-18 NOTE — Telephone Encounter (Signed)
Pt aware [name] LEFT AT FRONT DESK FOR PICK  UP .[name]

## 2012-11-21 ENCOUNTER — Ambulatory Visit: Payer: Medicaid Other | Admitting: Physical Therapy

## 2012-11-26 ENCOUNTER — Emergency Department (HOSPITAL_COMMUNITY)
Admission: EM | Admit: 2012-11-26 | Discharge: 2012-11-27 | Disposition: A | Discharge status: Home / Self Care | Payer: Medicaid Other | Attending: Emergency Medicine | Admitting: Emergency Medicine

## 2012-11-26 ENCOUNTER — Encounter (HOSPITAL_COMMUNITY): Payer: Self-pay | Admitting: *Deleted

## 2012-11-26 DIAGNOSIS — Z8669 Personal history of other diseases of the nervous system and sense organs: Secondary | ICD-10-CM | POA: Insufficient documentation

## 2012-11-26 DIAGNOSIS — M129 Arthropathy, unspecified: Secondary | ICD-10-CM | POA: Insufficient documentation

## 2012-11-26 DIAGNOSIS — Z794 Long term (current) use of insulin: Secondary | ICD-10-CM | POA: Insufficient documentation

## 2012-11-26 DIAGNOSIS — Z79899 Other long term (current) drug therapy: Secondary | ICD-10-CM | POA: Insufficient documentation

## 2012-11-26 DIAGNOSIS — R110 Nausea: Secondary | ICD-10-CM | POA: Insufficient documentation

## 2012-11-26 DIAGNOSIS — E785 Hyperlipidemia, unspecified: Secondary | ICD-10-CM | POA: Insufficient documentation

## 2012-11-26 DIAGNOSIS — Z9089 Acquired absence of other organs: Secondary | ICD-10-CM | POA: Insufficient documentation

## 2012-11-26 DIAGNOSIS — Z860100 Personal history of colon polyps, unspecified: Secondary | ICD-10-CM | POA: Insufficient documentation

## 2012-11-26 DIAGNOSIS — F3289 Other specified depressive episodes: Secondary | ICD-10-CM | POA: Insufficient documentation

## 2012-11-26 DIAGNOSIS — Z8709 Personal history of other diseases of the respiratory system: Secondary | ICD-10-CM | POA: Insufficient documentation

## 2012-11-26 DIAGNOSIS — N390 Urinary tract infection, site not specified: Secondary | ICD-10-CM | POA: Insufficient documentation

## 2012-11-26 DIAGNOSIS — Z8719 Personal history of other diseases of the digestive system: Secondary | ICD-10-CM | POA: Insufficient documentation

## 2012-11-26 DIAGNOSIS — Z7982 Long term (current) use of aspirin: Secondary | ICD-10-CM | POA: Insufficient documentation

## 2012-11-26 DIAGNOSIS — F329 Major depressive disorder, single episode, unspecified: Secondary | ICD-10-CM | POA: Insufficient documentation

## 2012-11-26 DIAGNOSIS — G8929 Other chronic pain: Secondary | ICD-10-CM | POA: Insufficient documentation

## 2012-11-26 DIAGNOSIS — G40909 Epilepsy, unspecified, not intractable, without status epilepticus: Secondary | ICD-10-CM | POA: Insufficient documentation

## 2012-11-26 DIAGNOSIS — Z9889 Other specified postprocedural states: Secondary | ICD-10-CM | POA: Insufficient documentation

## 2012-11-26 DIAGNOSIS — E119 Type 2 diabetes mellitus without complications: Secondary | ICD-10-CM | POA: Insufficient documentation

## 2012-11-26 DIAGNOSIS — Z90710 Acquired absence of both cervix and uterus: Secondary | ICD-10-CM | POA: Insufficient documentation

## 2012-11-26 DIAGNOSIS — I10 Essential (primary) hypertension: Secondary | ICD-10-CM | POA: Insufficient documentation

## 2012-11-26 DIAGNOSIS — Z87442 Personal history of urinary calculi: Secondary | ICD-10-CM | POA: Insufficient documentation

## 2012-11-26 DIAGNOSIS — J45909 Unspecified asthma, uncomplicated: Secondary | ICD-10-CM | POA: Insufficient documentation

## 2012-11-26 DIAGNOSIS — Z8679 Personal history of other diseases of the circulatory system: Secondary | ICD-10-CM | POA: Insufficient documentation

## 2012-11-26 DIAGNOSIS — I252 Old myocardial infarction: Secondary | ICD-10-CM | POA: Insufficient documentation

## 2012-11-26 LAB — URINALYSIS, ROUTINE W REFLEX MICROSCOPIC
Glucose, UA: NEGATIVE mg/dL
Hgb urine dipstick: NEGATIVE
Ketones, ur: NEGATIVE mg/dL
Protein, ur: 100 mg/dL — AB

## 2012-11-26 LAB — CBC
HCT: 38.4 % (ref 36.0–46.0)
RDW: 13.9 % (ref 11.5–15.5)
WBC: 5.7 10*3/uL (ref 4.0–10.5)

## 2012-11-26 MED ORDER — SODIUM CHLORIDE 0.9 % IV BOLUS (SEPSIS)
1000.0000 mL | Freq: Once | INTRAVENOUS | Status: AC
  Administered 2012-11-26: 1000 mL via INTRAVENOUS

## 2012-11-26 MED ORDER — ONDANSETRON HCL 4 MG/2ML IJ SOLN
4.0000 mg | Freq: Once | INTRAMUSCULAR | Status: AC
  Administered 2012-11-26: 4 mg via INTRAVENOUS
  Filled 2012-11-26: qty 2

## 2012-11-26 NOTE — ED Notes (Addendum)
Pt states she has had diarrhea and nausea since yesterday.  Pt also c/o cold chills, but has not taken her fever.  Pt also c/o generalized body aches and pains.

## 2012-11-27 LAB — COMPREHENSIVE METABOLIC PANEL
AST: 26 U/L (ref 0–37)
Albumin: 4.1 g/dL (ref 3.5–5.2)
Alkaline Phosphatase: 106 U/L (ref 39–117)
BUN: 14 mg/dL (ref 6–23)
Chloride: 102 mEq/L (ref 96–112)
Potassium: 4.1 mEq/L (ref 3.5–5.1)
Total Bilirubin: 0.2 mg/dL — ABNORMAL LOW (ref 0.3–1.2)

## 2012-11-27 MED ORDER — MORPHINE SULFATE 4 MG/ML IJ SOLN
4.0000 mg | Freq: Once | INTRAMUSCULAR | Status: AC
  Administered 2012-11-27: 4 mg via INTRAVENOUS
  Filled 2012-11-27: qty 1

## 2012-11-27 MED ORDER — ONDANSETRON HCL 4 MG/2ML IJ SOLN
4.0000 mg | Freq: Once | INTRAMUSCULAR | Status: AC
  Administered 2012-11-27: 4 mg via INTRAVENOUS
  Filled 2012-11-27: qty 2

## 2012-11-27 MED ORDER — GI COCKTAIL ~~LOC~~
30.0000 mL | Freq: Once | ORAL | Status: AC
  Administered 2012-11-27: 30 mL via ORAL
  Filled 2012-11-27: qty 30

## 2012-11-27 MED ORDER — PROMETHAZINE HCL 25 MG PO TABS: 25.0000 mg | ORAL_TABLET | Freq: Four times a day (QID) | ORAL | Status: DC | PRN

## 2012-11-27 MED ORDER — CIPROFLOXACIN HCL 500 MG PO TABS: 500.0000 mg | ORAL_TABLET | Freq: Two times a day (BID) | ORAL | Status: DC

## 2012-11-27 NOTE — ED Provider Notes (Signed)
History     CSN: [id number]  Arrival date & time 11/26/12  2123   First MD Initiated Contact with Patient 11/26/12 2234      Chief Complaint  Patient presents with  . Diarrhea  . Nausea    (Consider location/radiation/quality/duration/timing/severity/associated sxs/prior treatment) HPI Patient presents to the emergency Department, diarrhea, with nausea, that began this morning.  Patient denies chest pain, shortness of breath, abdominal pain, headache, fever, blurred vision, back pain, dysuria, hematuria, bloody stools, or syncope.  Patient, states, that nothing seems to help her condition.  The patient denies take any medications prior to arrival.  Patient, states, that she's had similar symptoms recently with a urinary tract infection.  Patient, states she was treated for this but still feels some urinary urgency.  Past Medical History  Diagnosis Date  . Diabetes mellitus     type 2  . Hypertension   . Hyperlipidemia   . LVH (left ventricular hypertrophy)   . Arthritis   . Colon polyps   . Diverticulosis   . Gallstones   . IBS (irritable bowel syndrome)   . Kidney stones   . Myocardial infarction   . Anginal pain     OCT 2013  . Paroxysmal a-fib     coumadin ( Chads2=2) Echo 09/05/10 EF 55-60%; mod LVH  . Depression   . 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 Sarcoidosis of lung     NONACTIVE      SEES DR NWGN  . Seizures     YEARS AGO NONE SINCE TEENS  . Chronic headaches   . Fibromyalgia   . Peripheral neuropathy     Past Surgical History  Procedure Laterality Date  . Left ankle fracture      closed reduction March 2008  . Wrist arthroscopy      June 2003  . Carpal tunnel release  2003    bilateral  . Subcutaneous transposition of left ulnar nerve    . Abdominal hysterectomy    . Cholecystectomy    . Elbow surgery      bilateral  . Esophageal manometry  03/21/2012    Procedure: ESOPHAGEAL MANOMETRY (EM);  Surgeon: [name], MD;  Location: [facility];  Service:  Endoscopy;  Laterality: N/A;  . Colon surgery    . Anterior cervical decomp/discectomy fusion  07/11/2012    Procedure: ANTERIOR CERVICAL DECOMPRESSION/DISCECTOMY FUSION 1 LEVEL;  Surgeon: [name], MD;  Location: [facility];  Service: Neurosurgery;  Laterality: N/A;  Cervical Five-Six Anterior Cervical Decompression with Fusion Interbody Prothesis Plating and Bonegraft    Family History  Problem Relation Age of Onset  . Heart attack Mother     @ age 59  . Heart attack Brother     @ age 42  . Colon cancer Maternal Aunt   . Mental illness Mother   . Prostate cancer Maternal Grandfather   . Ovarian cancer Maternal Aunt     and cousin  . Diabetes Mother   . Diabetes      siblings  . Hypertension Mother     siblings  . Lupus Sister     History  Substance Use Topics  . Smoking status: Never Smoker   . Smokeless tobacco: Never Used  . Alcohol Use: No    OB History   Grav Para Term Preterm Abortions TAB SAB Ect Mult Living  Review of Systems All other systems negative except as documented in the HPI. All pertinent positives and negatives as reviewed in the HPI.  Allergies  Amitriptyline; Hydromorphone hcl; and Prednisone  Home Medications   Current Outpatient Rx  Name  Route  Sig  Dispense  Refill  . albuterol (PROVENTIL HFA;VENTOLIN HFA) 108 (90 BASE) MCG/ACT inhaler   Inhalation   Inhale 2 puffs into the lungs every 4 (four) hours as needed for wheezing or shortness of breath.         Marland Kitchen aspirin EC 81 MG tablet   Oral   Take 81 mg by mouth daily.         Marland Kitchen atorvastatin (LIPITOR) 10 MG tablet   Oral   Take 10 mg by mouth daily.         . budesonide-formoterol (SYMBICORT) 80-4.5 MCG/ACT inhaler   Inhalation   Inhale 2 puffs into the lungs 2 (two) times daily.         . calcium carbonate (OS-CAL) 600 MG TABS   Oral   Take 1 tablet (600 mg total) by mouth daily.         . carvedilol (COREG) 12.5 MG tablet      Take 1 and  1/2 tablet in the am and 1 tablet in the pm   75 tablet   1     Decrease in dose   . cholecalciferol (VITAMIN D) 1000 UNITS tablet   Oral   Take 1,000 Units by mouth 2 (two) times daily.          . clonazePAM (KLONOPIN) 0.5 MG tablet   Oral   Take 0.5 mg by mouth at bedtime.         . Cranberry 500 MG CAPS   Oral   Take 500 mg by mouth 2 (two) times daily.         Marland Kitchen docusate sodium (COLACE) 100 MG capsule   Oral   Take 100 mg by mouth 2 (two) times daily as needed for constipation. For stool softner         . dofetilide ([name]) 250 MCG capsule   Oral   Take 1 capsule (250 mcg total) by mouth 2 (two) times daily.   180 capsule   4   . DULoxetine (CYMBALTA) 60 MG capsule   Oral   Take 60 mg by mouth daily.         Marland Kitchen esomeprazole (NEXIUM) 40 MG capsule   Oral   Take 40 mg by mouth 2 (two) times daily.         . ferrous sulfate 325 (65 FE) MG tablet   Oral   Take 325 mg by mouth daily with breakfast.         . gabapentin (NEURONTIN) 600 MG tablet   Oral   Take 600 mg by mouth 3 (three) times daily.         . insulin glargine (LANTUS) 100 UNIT/ML injection   Subcutaneous   Inject 40 Units into the skin at bedtime.         . levETIRAcetam (KEPPRA) 500 MG tablet   Oral   Take 500 mg by mouth every 12 (twelve) hours.         . Liraglutide (VICTOZA) 18 MG/3ML SOLN   Subcutaneous   Inject 1.8 mg into the skin daily.         Marland Kitchen loratadine (CLARITIN) 10 MG tablet   Oral   Take 10 mg by mouth  every morning.         . metFORMIN (GLUCOPHAGE) 500 MG tablet   Oral   Take 500 mg by mouth 2 (two) times daily with a meal.         . niacin (NIASPAN) 500 MG CR tablet   Oral   Take 500 mg by mouth at bedtime.          Marland Kitchen oxyCODONE-acetaminophen (PERCOCET) 5-325 MG per tablet   Oral   Take 1-2 tablets by mouth every 4 (four) hours as needed for pain.   20 tablet   0   . potassium chloride (K-DUR,KLOR-CON) 10 MEQ tablet   Oral   Take 1  tablet (10 mEq total) by mouth daily.   30 tablet   11   . rizatriptan (MAXALT) 10 MG tablet   Oral   Take 10 mg by mouth as needed. For migraines. May repeat in 2 hours if needed.         Marland Kitchen NITROSTAT 0.4 MG SL tablet      place 1 tablet under the tongue if needed every 5 minutes for chest pain for 3 doses IF NO RELIEF AFTER 3RD DOSE CALL PRESCRIBER OR 911.   25 each   12   . ondansetron (ZOFRAN ODT) 8 MG disintegrating tablet      8mg  ODT q4 hours prn nausea   20 tablet   0     BP 120/69  Pulse 76  Temp(Src) 97.7 F (36.5 C) (Oral)  Resp 18  SpO2 98%  Physical Exam  Nursing note and vitals reviewed. Constitutional: She is oriented to person, place, and time. She appears well-developed and well-nourished. No distress.  HENT:  Head: Normocephalic and atraumatic.  Mouth/Throat: Oropharynx is clear and moist.  Eyes: Pupils are equal, round, and reactive to light.  Neck: Normal range of motion. Neck supple.  Cardiovascular: Normal rate, regular rhythm and normal heart sounds.  Exam reveals no gallop and no friction rub.   No murmur heard. Pulmonary/Chest: Effort normal and breath sounds normal. No respiratory distress.  Abdominal: Soft. Bowel sounds are normal. She exhibits no distension. There is no tenderness. There is no guarding.  Neurological: She is alert and oriented to person, place, and time.  Skin: Skin is warm and dry.    ED Course  Procedures (including critical care time)  Labs Reviewed  COMPREHENSIVE METABOLIC PANEL - Abnormal; Notable for the following:    Glucose, Bld 112 (*)    Creatinine, Ser 1.19 (*)    Total Bilirubin 0.2 (*)    GFR calc non Af Amer 49 (*)    GFR calc Af Amer 56 (*)    All other components within normal limits  URINALYSIS, ROUTINE W REFLEX MICROSCOPIC - Abnormal; Notable for the following:    Color, Urine AMBER (*)    Specific Gravity, Urine 1.046 (*)    Bilirubin Urine SMALL (*)    Protein, ur 100 (*)    All other  components within normal limits  CBC  URINE MICROSCOPIC-ADD ON   Patient is given IV fluids and treated for mild dehydration.  Patient is advised to follow up with her primary care Dr. for further evaluation and recheck.  She is told to return to the emergency department for any worsening in her condition.  The patient has remained stable during her visit here in the department.  All questions were answered.  The patient is given a plan.  She is voiced an understanding of  the plan.   MDM  MDM Reviewed: vitals and nursing note Reviewed previous: labs Interpretation: labs           Carlyle Dolly, PA-C 11/28/12 0158

## 2012-11-28 ENCOUNTER — Encounter: Payer: Medicaid Other | Admitting: Physical Therapy

## 2012-11-28 NOTE — ED Provider Notes (Signed)
Medical screening examination/treatment/procedure(s) were performed by non-physician practitioner and as supervising physician I was immediately available for consultation/collaboration.  Jones Skene, M.D.     Jones Skene, MD 11/28/12 [phone]

## 2012-11-29 ENCOUNTER — Telehealth: Payer: Self-pay | Admitting: *Deleted

## 2012-11-29 ENCOUNTER — Encounter: Payer: Self-pay | Admitting: *Deleted

## 2012-11-29 ENCOUNTER — Telehealth: Payer: Self-pay | Admitting: Cardiovascular Disease

## 2012-11-29 NOTE — Telephone Encounter (Signed)
PT STOPPED BY OFFICE  NEEDING LETTER  STATING OKAY TO HAVE TENS UNIT  SEE LETTERS  PT AWARE READY  AND MAY PICK UP AT FRONT DESK .Zack Seal

## 2012-11-29 NOTE — Telephone Encounter (Signed)
Walk in pt Form " Pt Needs Letter about Tens Unit" Sent to Christine/Nishan  11/29/12/KM

## 2012-12-20 ENCOUNTER — Ambulatory Visit: Payer: Medicaid Other | Admitting: Gastroenterology

## 2012-12-22 ENCOUNTER — Encounter: Payer: Self-pay | Admitting: Cardiovascular Disease

## 2012-12-22 ENCOUNTER — Ambulatory Visit (INDEPENDENT_AMBULATORY_CARE_PROVIDER_SITE_OTHER): Payer: Medicaid Other | Admitting: Cardiovascular Disease

## 2012-12-22 VITALS — BP 146/78 | HR 70 | Ht 66.0 in | Wt 193.0 lb

## 2012-12-22 DIAGNOSIS — R079 Chest pain, unspecified: Secondary | ICD-10-CM

## 2012-12-22 DIAGNOSIS — I10 Essential (primary) hypertension: Secondary | ICD-10-CM

## 2012-12-22 DIAGNOSIS — E785 Hyperlipidemia, unspecified: Secondary | ICD-10-CM

## 2012-12-22 DIAGNOSIS — I4891 Unspecified atrial fibrillation: Secondary | ICD-10-CM

## 2012-12-22 NOTE — Progress Notes (Signed)
Patient ID: [name], female   DOB: 01/10/1951, 62 y.o.   MRN: [id number] HPI: This is a 62 year old African American female patient of Dr. Ocie Bob, Dr. Charlton Haws, and Dr. Lovell Sheehan, neurology. She has a history of paroxysmal atrial fibrillation treated with [name] since 2011. Cardiac catheterization in 5/12 demonstrated normal coronary arteries. Last 2-D echo ejection fraction was 60-65% with mild LVH and mild atrial enlargement. She has chronic asthma and a questionable diagnosis of sarcoid. She also has a history of orthostatic hypotension.  The patient was recently in the emergency room for paroxysmal atrial fibrillation on 11/09/12. Her Coreg was increased to one half tablets b.i.d. She also had an emergency room visit in January 2014 with chest pain and ruled out for an MI. Her potassium was 3.3 at that time. She was in the emergency room again last week with a diagnosis of pyelonephritis.  The patient comes in today feeling like her asthma is acting up. She is a low short of breath. She was sent to rehabilitation to work on her balance last week but had trouble completing this. Since her emergency room visit she is not had any further palpitations. Her blood pressure has been up and down her heart rate has been stable. She does have occasional dizziness when she stands up quickly. She denies chest pain, orthopnea, edema, or syncope.  ROS: Denies fever, malais, weight loss, blurry vision, decreased visual acuity, cough, sputum, SOB, hemoptysis, pleuritic pain, palpitaitons, heartburn, abdominal pain, melena, lower extremity edema, claudication, or rash.  All other systems reviewed and negative  General: Affect appropriate Healthy:  appears stated age HEENT: normal Neck supple with no adenopathy JVP normal no bruits no thyromegaly Lungs clear with no wheezing and good diaphragmatic motion Heart:  S1/S2 no murmur, no rub, gallop or click PMI normal Abdomen: benighn, BS positve, no  tenderness, no AAA no bruit.  No HSM or HJR Distal pulses intact with no bruits No edema Neuro non-focal Skin warm and dry No muscular weakness   Current Outpatient Prescriptions  Medication Sig Dispense Refill  . albuterol (PROVENTIL HFA;VENTOLIN HFA) 108 (90 BASE) MCG/ACT inhaler Inhale 2 puffs into the lungs every 4 (four) hours as needed for wheezing or shortness of breath.      Marland Kitchen aspirin EC 81 MG tablet Take 81 mg by mouth daily.      Marland Kitchen atorvastatin (LIPITOR) 10 MG tablet Take 10 mg by mouth daily.      . budesonide-formoterol (SYMBICORT) 80-4.5 MCG/ACT inhaler Inhale 2 puffs into the lungs 2 (two) times daily.      . calcium carbonate (OS-CAL) 600 MG TABS Take 1 tablet (600 mg total) by mouth daily.      . carvedilol (COREG) 12.5 MG tablet Take 1 and 1/2 tablet in the am and 1 tablet in the pm  75 tablet  1  . cholecalciferol (VITAMIN D) 1000 UNITS tablet Take 1,000 Units by mouth 2 (two) times daily.       . ciprofloxacin (CIPRO) 500 MG tablet Take 1 tablet (500 mg total) by mouth 2 (two) times daily.  20 tablet  0  . clonazePAM (KLONOPIN) 0.5 MG tablet Take 0.5 mg by mouth at bedtime.      . Cranberry 500 MG CAPS Take 500 mg by mouth 2 (two) times daily.      Marland Kitchen docusate sodium (COLACE) 100 MG capsule Take 100 mg by mouth 2 (two) times daily as needed for constipation. For stool softner      .  dofetilide ([name]) 250 MCG capsule Take 1 capsule (250 mcg total) by mouth 2 (two) times daily.  180 capsule  4  . DULoxetine (CYMBALTA) 60 MG capsule Take 60 mg by mouth daily.      Marland Kitchen esomeprazole (NEXIUM) 40 MG capsule Take 40 mg by mouth 2 (two) times daily.      . ferrous sulfate 325 (65 FE) MG tablet Take 325 mg by mouth daily with breakfast.      . gabapentin (NEURONTIN) 600 MG tablet Take 600 mg by mouth 3 (three) times daily.      . insulin glargine (LANTUS) 100 UNIT/ML injection Inject 40 Units into the skin at bedtime.      . levETIRAcetam (KEPPRA) 500 MG tablet Take 500 mg by  mouth every 12 (twelve) hours.      . Liraglutide (VICTOZA) 18 MG/3ML SOLN Inject 1.8 mg into the skin daily.      Marland Kitchen loratadine (CLARITIN) 10 MG tablet Take 10 mg by mouth every morning.      . metFORMIN (GLUCOPHAGE) 500 MG tablet Take 500 mg by mouth 2 (two) times daily with a meal.      . niacin (NIASPAN) 500 MG CR tablet Take 500 mg by mouth at bedtime.       Marland Kitchen NITROSTAT 0.4 MG SL tablet place 1 tablet under the tongue if needed every 5 minutes for chest pain for 3 doses IF NO RELIEF AFTER 3RD DOSE CALL PRESCRIBER OR 911.  25 each  12  . ondansetron (ZOFRAN ODT) 8 MG disintegrating tablet 8mg  ODT q4 hours prn nausea  20 tablet  0  . potassium chloride (K-DUR,KLOR-CON) 10 MEQ tablet Take 1 tablet (10 mEq total) by mouth daily.  30 tablet  11  . promethazine (PHENERGAN) 25 MG tablet Take 1 tablet (25 mg total) by mouth every 6 (six) hours as needed for nausea.  10 tablet  0  . rizatriptan (MAXALT) 10 MG tablet Take 10 mg by mouth as needed. For migraines. May repeat in 2 hours if needed.       No current facility-administered medications for this visit.    Allergies  Amitriptyline; Hydromorphone hcl; and Prednisone  Electrocardiogram:  Assessment and Plan

## 2012-12-22 NOTE — Assessment & Plan Note (Signed)
Maint NSR on low dose [name] continue Stable

## 2012-12-22 NOTE — Assessment & Plan Note (Signed)
Resolved No CAD on previous CATH

## 2012-12-22 NOTE — Patient Instructions (Signed)
Your physician wants you to follow-up in: YEAR WITH DR NISHAN  You will receive a reminder letter in the mail two months in advance. If you don't receive a letter, please call our office to schedule the follow-up appointment.  Your physician recommends that you continue on your current medications as directed. Please refer to the Current Medication list given to you today. 

## 2012-12-22 NOTE — Assessment & Plan Note (Signed)
Well controlled.  Continue current medications and low sodium Dash type diet.    

## 2012-12-22 NOTE — Assessment & Plan Note (Signed)
Cholesterol is at goal.  Continue current dose of statin and diet Rx.  No myalgias or side effects.  F/U  LFT's in 6 months. Lab Results  Component Value Date   LDLCALC  Value: UNABLE TO CALCULATE IF TRIGLYCERIDE OVER 400 mg/dL        Total Cholesterol/HDL:CHD Risk Coronary Heart Disease Risk Table                     Men   Women  1/2 Average Risk   3.4   3.3  Average Risk       5.0   4.4  2 X Average Risk   9.6   7.1  3 X Average Risk  23.4   11.0        Use the calculated Patient Ratio above and the CHD Risk Table to determine the patient's CHD Risk.        ATP III CLASSIFICATION (LDL):  <100     mg/dL   Optimal  100-129  mg/dL   Near or Above                    Optimal  130-159  mg/dL   Borderline  160-189  mg/dL   High  >190     mg/dL   Very High 09/08/2010             

## 2012-12-26 ENCOUNTER — Telehealth: Payer: Self-pay | Admitting: Gastroenterology

## 2012-12-26 NOTE — Telephone Encounter (Signed)
Message copied by Arna Snipe on Mon Dec 26, 2012  8:31 AM ------      Message from: Domingo Sep      Created: Tue Dec 20, 2012 12:09 PM      Regarding: FW: Cancellation fee                   ----- Message -----         From: [name], MD         Sent: 12/20/2012  11:24 AM           To: Domingo Sep      Subject: RE: Cancellation fee                                     No            ----- Message -----         From: Domingo Sep         Sent: 12/20/2012  10:52 AM           To: [name], MD      Subject: Cancellation fee                                         Patient was scheduled to see you this morning at 10:30.  She called around 9:00 to reschedule due to transportation.  Do you want to charge ?       ------

## 2012-12-27 ENCOUNTER — Ambulatory Visit (INDEPENDENT_AMBULATORY_CARE_PROVIDER_SITE_OTHER): Payer: Medicaid Other | Admitting: Gastroenterology

## 2012-12-27 ENCOUNTER — Other Ambulatory Visit: Payer: Medicaid Other

## 2012-12-27 ENCOUNTER — Encounter: Payer: Self-pay | Admitting: Gastroenterology

## 2012-12-27 VITALS — BP 122/60 | HR 76 | Ht 66.0 in | Wt 195.0 lb

## 2012-12-27 DIAGNOSIS — R1032 Left lower quadrant pain: Secondary | ICD-10-CM

## 2012-12-27 DIAGNOSIS — R197 Diarrhea, unspecified: Secondary | ICD-10-CM

## 2012-12-27 NOTE — Patient Instructions (Addendum)
You will be set up for a CT scan of abdomen and pelvis with IV and oral contrast for your lower abdominal pains. You will have labs checked today in the basement lab.  Please head down after you check out with the front desk  (c. Diff by pcr, routine culture, ova and parasites) for diarrhea, loose stools. Take pecid or zantac (OTC) as needed for GERD. Continue on nexium twice daily.   You have been scheduled for a CT scan of the abdomen and pelvis at [facility] ([address]---this is in the same building as [facility]).   You are scheduled on 12/29/12 at 9 am. You should arrive 15 minutes prior to your appointment time for registration. Please follow the written instructions below on the day of your exam:  WARNING: IF YOU ARE ALLERGIC TO IODINE/X-RAY DYE, PLEASE NOTIFY RADIOLOGY IMMEDIATELY AT [phone]! YOU WILL BE GIVEN A 13 HOUR PREMEDICATION PREP.  1) Do not eat or drink anything after 5 am (4 hours prior to your test) 2) You have been given 2 bottles of oral contrast to drink. The solution may taste better if refrigerated, but do NOT add ice or any other liquid to this solution. Shake well before drinking.    Drink 1 bottle of contrast @ 7 am (2 hours prior to your exam)  Drink 1 bottle of contrast @ 8 am (1 hour prior to your exam)  You may take any medications as prescribed with a small amount of water except for the following: Metformin, Glucophage, Glucovance, Avandamet, Riomet, Fortamet, Actoplus Met, Janumet, Glumetza or Metaglip. The above medications must be held the day of the exam AND 48 hours after the exam.  The purpose of you drinking the oral contrast is to aid in the visualization of your intestinal tract. The contrast solution may cause some diarrhea. Before your exam is started, you will be given a small amount of fluid to drink. Depending on your individual set of symptoms, you may also receive an intravenous injection of x-ray contrast/dye. Plan  on being at [facility] for 30 minutes or long, depending on the type of exam you are having performed.  If you have any questions regarding your exam or if you need to reschedule, you may call the CT department at [phone] between the hours of 8:00 am and 5:00 pm, Monday-Friday.  ________________________________________________________________________                                               We are excited to introduce MyChart, a new best-in-class service that provides you online access to important information in your electronic medical record. We want to make it easier for you to view your health information - all in one secure location - when and where you need it. We expect MyChart will enhance the quality of care and service we provide.  When you register for MyChart, you can:    View your test results.    Request appointments and receive appointment reminders via email.    Request medication renewals.    View your medical history, allergies, medications and immunizations.    Communicate with your physician's office through a password-protected site.    Conveniently print information such as your medication lists.  To find out if MyChart is right for you, please talk to a member of our  clinical staff today. We will gladly answer your questions about this free health and wellness tool.  If you are age 72 or older and want a member of your family to have access to your record, you must provide written consent by completing a proxy form available at our office. Please speak to our clinical staff about guidelines regarding accounts for patients younger than age 42.  As you activate your MyChart account and need any technical assistance, please call the MyChart technical support line at [phone] [phone]) or email your question to [email or web address]. If you email your question(s), please include your name, a return phone number and the best time to reach  you.  If you have non-urgent health-related questions, you can send a message to our office through MyChart at [email or web address]. If you have a medical emergency, call 911.  Thank you for using MyChart as your new health and wellness resource!   MyChart licensed from [employer],  [id number]. Patents Pending.

## 2012-12-27 NOTE — Progress Notes (Signed)
Review of pertinent gastrointestinal problems:  1. chronic GERD. EGD February 2013 showed mild gastritis, H. pylori negative on biopsy. On proton pump inhibitor once daily and nightly Zantac  2. history of adenomatous polyps: Colonoscopy February 2013 found diverticulosis but no polyps. Recall colonoscopy at 5 year interval given 2008 adenomatous polyps  3. hoarseness, coughing, intermittent shortness of breath; not clear if this is related to her GERD but seems unlikely (5/13); was referred to ear nose and throat physician who ordered MBSS 2013: Clinical impression: Pt demonstrated appearance of a primary esophageal dysphagia. Oral and oropharyngeal function within normal limits with no aspiration or penetration. Esophageal sweep showed appearance of slow emptying of thin liquids though GE juntction. After about 4 oz consumption of liquid barium. Pt began coughing and gagging, expectorating barium tinged secretions. Pt attempted to consume puree and could not due to hard coughing and gagging. Pt would benefit from further assessment by GI. 02/2012 Esophageal manometry was completely normal.  HPI: This is a pleasant 62 year old woman whom I last saw several months ago.   Cbc, cmet last month were normal.  "hurting" having stomach pains.   Bowels to be loose, past the constipated. She has lower abdominal pain bilaterally. She says this is sometimes excruciating sometimes bother.  Has been to ER 2-3 times recently. Had UTI and was told he infection went to her kidneys and she is due to see a kidney.  Takes oxycodone tid every day, seen at pain clinic.  10/325   Past Medical History  Diagnosis Date  . Diabetes mellitus     type 2  . Hypertension   . Hyperlipidemia   . LVH (left ventricular hypertrophy)   . Arthritis   . Colon polyps   . Diverticulosis   . Gallstones   . IBS (irritable bowel syndrome)   . Kidney stones   . Myocardial infarction   . Anginal pain     OCT 2013  . Paroxysmal  a-fib     coumadin ( Chads2=2) Echo 09/05/10 EF 55-60%; mod LVH  . Depression   . 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 Sarcoidosis of lung     NONACTIVE      SEES DR ZOXW  . Seizures     YEARS AGO NONE SINCE TEENS  . Chronic headaches   . Fibromyalgia   . Peripheral neuropathy     Past Surgical History  Procedure Laterality Date  . Left ankle fracture      closed reduction March 2008  . Wrist arthroscopy      June 2003  . Carpal tunnel release  2003    bilateral  . Subcutaneous transposition of left ulnar nerve    . Abdominal hysterectomy    . Cholecystectomy    . Elbow surgery      bilateral  . Esophageal manometry  03/21/2012    Procedure: ESOPHAGEAL MANOMETRY (EM);  Surgeon: [name], MD;  Location: [facility];  Service: Endoscopy;  Laterality: N/A;  . Colon surgery    . Anterior cervical decomp/discectomy fusion  07/11/2012    Procedure: ANTERIOR CERVICAL DECOMPRESSION/DISCECTOMY FUSION 1 LEVEL;  Surgeon: [name], MD;  Location: [facility];  Service: Neurosurgery;  Laterality: N/A;  Cervical Five-Six Anterior Cervical Decompression with Fusion Interbody Prothesis Plating and Bonegraft    Current Outpatient Prescriptions  Medication Sig Dispense Refill  . albuterol (PROVENTIL HFA;VENTOLIN HFA) 108 (90 BASE) MCG/ACT inhaler Inhale 2 puffs into the lungs every 4 (four)  hours as needed for wheezing or shortness of breath.      Marland Kitchen aspirin EC 81 MG tablet Take 81 mg by mouth daily.      Marland Kitchen atorvastatin (LIPITOR) 10 MG tablet Take 10 mg by mouth daily.      . budesonide-formoterol (SYMBICORT) 80-4.5 MCG/ACT inhaler Inhale 2 puffs into the lungs 2 (two) times daily.      . calcium carbonate (OS-CAL) 600 MG TABS Take 1 tablet (600 mg total) by mouth daily.      . carvedilol (COREG) 12.5 MG tablet Take 1 and 1/2 tablet in the am and 1 tablet in the pm  75 tablet  1  . cholecalciferol (VITAMIN D) 1000 UNITS tablet Take 1,000 Units by mouth 2 (two) times daily.       .  ciprofloxacin (CIPRO) 500 MG tablet Take 1 tablet (500 mg total) by mouth 2 (two) times daily.  20 tablet  0  . clonazePAM (KLONOPIN) 0.5 MG tablet Take 0.5 mg by mouth at bedtime.      . Cranberry 500 MG CAPS Take 500 mg by mouth 2 (two) times daily.      Marland Kitchen docusate sodium (COLACE) 100 MG capsule Take 100 mg by mouth 2 (two) times daily as needed for constipation. For stool softner      . dofetilide ([name]) 250 MCG capsule Take 1 capsule (250 mcg total) by mouth 2 (two) times daily.  180 capsule  4  . DULoxetine (CYMBALTA) 60 MG capsule Take 60 mg by mouth daily.      Marland Kitchen esomeprazole (NEXIUM) 40 MG capsule Take 40 mg by mouth 2 (two) times daily.      . ferrous sulfate 325 (65 FE) MG tablet Take 325 mg by mouth daily with breakfast.      . gabapentin (NEURONTIN) 600 MG tablet Take 600 mg by mouth 3 (three) times daily.      . insulin glargine (LANTUS) 100 UNIT/ML injection Inject 40 Units into the skin at bedtime.      . Liraglutide (VICTOZA) 18 MG/3ML SOLN Inject 1.8 mg into the skin daily.      Marland Kitchen loratadine (CLARITIN) 10 MG tablet Take 10 mg by mouth every morning.      . metFORMIN (GLUCOPHAGE) 500 MG tablet Take 500 mg by mouth 2 (two) times daily with a meal.      . niacin (NIASPAN) 500 MG CR tablet Take 500 mg by mouth at bedtime.       Marland Kitchen NITROSTAT 0.4 MG SL tablet place 1 tablet under the tongue if needed every 5 minutes for chest pain for 3 doses IF NO RELIEF AFTER 3RD DOSE CALL PRESCRIBER OR 911.  25 each  12  . ondansetron (ZOFRAN ODT) 8 MG disintegrating tablet 8mg  ODT q4 hours prn nausea  20 tablet  0  . oxyCODONE-acetaminophen (PERCOCET) 10-325 MG per tablet Take 1 tablet by mouth every 4 (four) hours as needed for pain.      . promethazine (PHENERGAN) 25 MG tablet Take 1 tablet (25 mg total) by mouth every 6 (six) hours as needed for nausea.  10 tablet  0  . rizatriptan (MAXALT) 10 MG tablet Take 10 mg by mouth as needed. For migraines. May repeat in 2 hours if needed.      .  vitamin C (ASCORBIC ACID) 500 MG tablet Take 500 mg by mouth 2 (two) times daily.      . potassium chloride (K-DUR,KLOR-CON) 10 MEQ tablet Take 10  mEq by mouth daily.       No current facility-administered medications for this visit.    Allergies as of 12/27/2012 - Review Complete 12/27/2012  Allergen Reaction Noted  . Amitriptyline Other (See Comments) 08/25/2011  . Hydromorphone hcl Other (See Comments)   . Prednisone  07/07/2012    Family History  Problem Relation Age of Onset  . Heart attack Mother     @ age 44  . Heart attack Brother 50  . Colon cancer Maternal Aunt   . Mental illness Mother   . Prostate cancer Maternal Grandfather   . Ovarian cancer Maternal Aunt   . Diabetes Mother   . Diabetes    . Hypertension Mother     siblings  . Lupus Sister   . Ovarian cancer Cousin   . Hypertension      History   Social History  . Marital Status: Divorced    Spouse Name: N/A    Number of Children: 3  . Years of Education: N/A   Occupational History  . disabled     CNA   Social History Main Topics  . Smoking status: Never Smoker   . Smokeless tobacco: Never Used  . Alcohol Use: No  . Drug Use: No  . Sexually Active: Not on file   Other Topics Concern  . Not on file   Social History Narrative  . No narrative on file      Physical Exam: BP 122/60  Pulse 76  Ht 5\' 6"  (1.676 m)  Wt 195 lb (88.451 kg)  BMI 31.49 kg/m2 Constitutional: generally well-appearing Psychiatric: alert and oriented x3 Abdomen: soft, nontender, nondistended, no obvious ascites, no peritoneal signs, normal bowel sounds     Assessment and plan: 62 y.o. female with lower abdominal pains, myriad GI complaints  Last CT scan was in 2012 apply to repeat that now with a abdominal pelvic CT scan. She has lost maybe 10 pounds in past several months. Takes quite a lot of narcotics on a daily basis and I suspect is probably playing a role in some of her symptoms. She will also have her  stool testing today including C. difficile, routine culture.

## 2012-12-28 ENCOUNTER — Other Ambulatory Visit: Payer: Medicaid Other

## 2012-12-28 DIAGNOSIS — R197 Diarrhea, unspecified: Secondary | ICD-10-CM

## 2012-12-28 DIAGNOSIS — R1032 Left lower quadrant pain: Secondary | ICD-10-CM

## 2012-12-29 ENCOUNTER — Ambulatory Visit (INDEPENDENT_AMBULATORY_CARE_PROVIDER_SITE_OTHER)
Admission: RE | Admit: 2012-12-29 | Discharge: 2012-12-29 | Disposition: A | Discharge status: Home / Self Care | Payer: Medicaid Other | Source: Ambulatory Visit | Attending: Gastroenterology | Admitting: Gastroenterology

## 2012-12-29 DIAGNOSIS — R1032 Left lower quadrant pain: Secondary | ICD-10-CM

## 2012-12-29 DIAGNOSIS — R197 Diarrhea, unspecified: Secondary | ICD-10-CM

## 2012-12-29 MED ORDER — IOHEXOL 300 MG/ML  SOLN
100.0000 mL | Freq: Once | INTRAMUSCULAR | Status: AC | PRN
  Administered 2012-12-29: 100 mL via INTRAVENOUS

## 2012-12-30 LAB — OVA AND PARASITE SCREEN: OP: NONE SEEN

## 2013-01-03 ENCOUNTER — Telehealth: Payer: Self-pay | Admitting: Cardiovascular Disease

## 2013-01-03 NOTE — Telephone Encounter (Signed)
New problem   Pt has question about traveling

## 2013-01-03 NOTE — Telephone Encounter (Signed)
SPOKE WITH PT  IS WANTING TO KNOW IF MAY TRAVEL BY PLANE OR MAYBE TRAIN TO [place] TO SEE SISTER  WILL FORWARD  TO DR [name]  FOR REVIEW .Zack Seal

## 2013-01-03 NOTE — Telephone Encounter (Signed)
Ok to travel by plane or bus

## 2013-01-04 NOTE — Telephone Encounter (Signed)
PT AWARE MAY TRAVEL  DID INSTRUCT IF DOES GO BY BUS OR TRAIN  NOT TO SIT MORE THAN A COUPLE OF HOURS AT A TIME WITHOUT GETTING UP AND MOVING AROUND TO PREVENT  POSSIBLE  DVT OR PE  PT VERBALIZED UNDERSTANDING./CY

## 2013-01-06 ENCOUNTER — Telehealth: Payer: Self-pay | Admitting: Cardiovascular Disease

## 2013-01-06 NOTE — Telephone Encounter (Signed)
New Prob       Dr. Parke Simmers has some questions regarding medication dosage changed per Dr. [name]. Please call,.

## 2013-01-16 NOTE — Telephone Encounter (Signed)
LMTCB ./CY 

## 2013-01-17 ENCOUNTER — Other Ambulatory Visit: Payer: Self-pay | Admitting: Cardiology

## 2013-01-17 MED ORDER — CARVEDILOL 12.5 MG PO TABS: ORAL_TABLET | ORAL | Status: DC

## 2013-01-17 NOTE — Telephone Encounter (Signed)
LMTCB ./CY 

## 2013-01-18 ENCOUNTER — Telehealth: Payer: Self-pay | Admitting: Cardiovascular Disease

## 2013-01-18 NOTE — Telephone Encounter (Signed)
Dr Lora Havens office rtn christine call, pls call [phone] sherry

## 2013-01-18 NOTE — Telephone Encounter (Signed)
Spoke with Cordelia Pen at Dr. Tedra [place] office and gave her Dr. Fabio Bering approval for Dr. Parke Simmers to handle patient's BP fluctuations.  Cordelia Pen verbalized understanding and thanked me for our help

## 2013-01-18 NOTE — Telephone Encounter (Signed)
Spoke with Cordelia Pen at Dr. Tedra [place] office. Pt was seen in their office on 01/06/13. Blood pressure was 158/91 and heart rate 71.  Pt told them medications were recently adjusted by cardiology.  Chart reviewed and Coreg decreased at office visit with Jacolyn Reedy, PA on 4/2. Pt saw Dr. [name] on 12/22/12 and no medication changes were made.  Per Cordelia Pen Dr. Parke Simmers would like to know if Dr. [name] would like her to address recent blood pressure elevation.  Dr. Parke Simmers did not want to make any changes until she got OK from cardiology to address.

## 2013-01-18 NOTE — Telephone Encounter (Signed)
Ok for primary to handle BP fluctuations

## 2013-01-21 ENCOUNTER — Emergency Department (HOSPITAL_COMMUNITY): Payer: Medicaid Other

## 2013-01-21 ENCOUNTER — Emergency Department (HOSPITAL_COMMUNITY)
Admission: EM | Admit: 2013-01-21 | Discharge: 2013-01-21 | Disposition: A | Discharge status: Home / Self Care | Payer: Medicaid Other | Attending: Emergency Medicine | Admitting: Emergency Medicine

## 2013-01-21 ENCOUNTER — Encounter (HOSPITAL_COMMUNITY): Payer: Self-pay | Admitting: Emergency Medicine

## 2013-01-21 DIAGNOSIS — I209 Angina pectoris, unspecified: Secondary | ICD-10-CM | POA: Insufficient documentation

## 2013-01-21 DIAGNOSIS — E119 Type 2 diabetes mellitus without complications: Secondary | ICD-10-CM | POA: Insufficient documentation

## 2013-01-21 DIAGNOSIS — M129 Arthropathy, unspecified: Secondary | ICD-10-CM | POA: Insufficient documentation

## 2013-01-21 DIAGNOSIS — Z79899 Other long term (current) drug therapy: Secondary | ICD-10-CM | POA: Insufficient documentation

## 2013-01-21 DIAGNOSIS — Z8719 Personal history of other diseases of the digestive system: Secondary | ICD-10-CM | POA: Insufficient documentation

## 2013-01-21 DIAGNOSIS — I252 Old myocardial infarction: Secondary | ICD-10-CM | POA: Insufficient documentation

## 2013-01-21 DIAGNOSIS — K5730 Diverticulosis of large intestine without perforation or abscess without bleeding: Secondary | ICD-10-CM | POA: Insufficient documentation

## 2013-01-21 DIAGNOSIS — R51 Headache: Secondary | ICD-10-CM | POA: Insufficient documentation

## 2013-01-21 DIAGNOSIS — I4891 Unspecified atrial fibrillation: Secondary | ICD-10-CM | POA: Insufficient documentation

## 2013-01-21 DIAGNOSIS — Z860100 Personal history of colon polyps, unspecified: Secondary | ICD-10-CM | POA: Insufficient documentation

## 2013-01-21 DIAGNOSIS — Z7901 Long term (current) use of anticoagulants: Secondary | ICD-10-CM | POA: Insufficient documentation

## 2013-01-21 DIAGNOSIS — I10 Essential (primary) hypertension: Secondary | ICD-10-CM | POA: Insufficient documentation

## 2013-01-21 DIAGNOSIS — F3289 Other specified depressive episodes: Secondary | ICD-10-CM | POA: Insufficient documentation

## 2013-01-21 DIAGNOSIS — G40909 Epilepsy, unspecified, not intractable, without status epilepticus: Secondary | ICD-10-CM | POA: Insufficient documentation

## 2013-01-21 DIAGNOSIS — R0789 Other chest pain: Secondary | ICD-10-CM | POA: Insufficient documentation

## 2013-01-21 DIAGNOSIS — Z7982 Long term (current) use of aspirin: Secondary | ICD-10-CM | POA: Insufficient documentation

## 2013-01-21 DIAGNOSIS — R112 Nausea with vomiting, unspecified: Secondary | ICD-10-CM | POA: Insufficient documentation

## 2013-01-21 DIAGNOSIS — Z8669 Personal history of other diseases of the nervous system and sense organs: Secondary | ICD-10-CM | POA: Insufficient documentation

## 2013-01-21 DIAGNOSIS — J45901 Unspecified asthma with (acute) exacerbation: Secondary | ICD-10-CM | POA: Insufficient documentation

## 2013-01-21 DIAGNOSIS — R5381 Other malaise: Secondary | ICD-10-CM | POA: Insufficient documentation

## 2013-01-21 DIAGNOSIS — E785 Hyperlipidemia, unspecified: Secondary | ICD-10-CM | POA: Insufficient documentation

## 2013-01-21 DIAGNOSIS — G8929 Other chronic pain: Secondary | ICD-10-CM | POA: Insufficient documentation

## 2013-01-21 DIAGNOSIS — IMO0001 Reserved for inherently not codable concepts without codable children: Secondary | ICD-10-CM | POA: Insufficient documentation

## 2013-01-21 DIAGNOSIS — Z8709 Personal history of other diseases of the respiratory system: Secondary | ICD-10-CM | POA: Insufficient documentation

## 2013-01-21 DIAGNOSIS — R079 Chest pain, unspecified: Secondary | ICD-10-CM

## 2013-01-21 DIAGNOSIS — Z8679 Personal history of other diseases of the circulatory system: Secondary | ICD-10-CM | POA: Insufficient documentation

## 2013-01-21 DIAGNOSIS — Z794 Long term (current) use of insulin: Secondary | ICD-10-CM | POA: Insufficient documentation

## 2013-01-21 DIAGNOSIS — Z87442 Personal history of urinary calculi: Secondary | ICD-10-CM | POA: Insufficient documentation

## 2013-01-21 DIAGNOSIS — F329 Major depressive disorder, single episode, unspecified: Secondary | ICD-10-CM | POA: Insufficient documentation

## 2013-01-21 LAB — CBC
HCT: 36.3 % (ref 36.0–46.0)
MCV: 87.9 fL (ref 78.0–100.0)
Platelets: 184 10*3/uL (ref 150–400)
RBC: 4.13 MIL/uL (ref 3.87–5.11)
WBC: 3.2 10*3/uL — ABNORMAL LOW (ref 4.0–10.5)

## 2013-01-21 LAB — BASIC METABOLIC PANEL
CO2: 28 mEq/L (ref 19–32)
Chloride: 103 mEq/L (ref 96–112)
Creatinine, Ser: 1.1 mg/dL (ref 0.50–1.10)

## 2013-01-21 LAB — POCT I-STAT TROPONIN I

## 2013-01-21 LAB — TROPONIN I: Troponin I: <0.3 ng/mL (ref <0.30)

## 2013-01-21 LAB — PRO B NATRIURETIC PEPTIDE: Pro B Natriuretic peptide (BNP): 74.8 pg/mL (ref 0–125)

## 2013-01-21 MED ORDER — FUROSEMIDE 20 MG PO TABS: 20.0000 mg | ORAL_TABLET | Freq: Two times a day (BID) | ORAL | Status: DC

## 2013-01-21 MED ORDER — ACETAMINOPHEN 325 MG PO TABS
650.0000 mg | ORAL_TABLET | Freq: Once | ORAL | Status: AC
Start: 2013-01-21 — End: 2013-01-21
  Administered 2013-01-21: 650 mg via ORAL
  Filled 2013-01-21: qty 2

## 2013-01-21 MED ORDER — NITROGLYCERIN 0.4 MG SL SUBL
0.4000 mg | SUBLINGUAL_TABLET | SUBLINGUAL | Status: AC | PRN
  Administered 2013-01-21 (×3): 0.4 mg via SUBLINGUAL
  Filled 2013-01-21: qty 25

## 2013-01-21 MED ORDER — ASPIRIN 81 MG PO CHEW
324.0000 mg | CHEWABLE_TABLET | Freq: Once | ORAL | Status: AC
  Administered 2013-01-21: 324 mg via ORAL
  Filled 2013-01-21: qty 4

## 2013-01-21 MED ORDER — CARVEDILOL 12.5 MG PO TABS: 18.3750 mg | ORAL_TABLET | Freq: Two times a day (BID) | ORAL | Status: DC

## 2013-01-21 NOTE — Consult Note (Addendum)
Reason for Consult:chest pain  Referring Physician: Dr. Marlin Canary [name] is an 62 y.o. female.   HPI: 62 yo woman with multiple medical problems presents to ER earlier today with elevated blood pressure and headache. She has a h/o HTN and recently had her dose of Coreg reduced such that she is on 18.375 mg in a.m. And 12.5 mg in p.m. She denies medial or dietary non-compliance. She had a heart cath 2 years ago demonstrating normal coronary arteries. Her initial ECG demonstrated non-specific T wave abnormality, unchanged from prior ECG 2 months ago. She notes that she had a HA and chest pressure with her elevated blood pressure. No radiation or sob. Denies peripheral edema. She has a h/o PAF but no recent symptoms. Her cardiac enzymes were initially negative. She is currently pain free but does have some residual headache.  PMH: Past Medical History  Diagnosis Date  . Diabetes mellitus     type 2  . Hypertension   . Hyperlipidemia   . LVH (left ventricular hypertrophy)   . Arthritis   . Colon polyps   . Diverticulosis   . Gallstones   . IBS (irritable bowel syndrome)   . Kidney stones   . Myocardial infarction   . Anginal pain     OCT 2013  . Paroxysmal a-fib     coumadin ( Chads2=2) Echo 09/05/10 EF 55-60%; mod LVH  . Depression   . 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 Sarcoidosis of lung     NONACTIVE      SEES DR ZOXW  . Seizures     YEARS AGO NONE SINCE TEENS  . Chronic headaches   . Fibromyalgia   . Peripheral neuropathy     PSHX: Past Surgical History  Procedure Laterality Date  . Left ankle fracture      closed reduction March 2008  . Wrist arthroscopy      June 2003  . Carpal tunnel release  2003    bilateral  . Subcutaneous transposition of left ulnar nerve    . Abdominal hysterectomy    . Cholecystectomy    . Elbow surgery      bilateral  . Esophageal manometry  03/21/2012    Procedure: ESOPHAGEAL MANOMETRY (EM);  Surgeon: [name], MD;  Location: [facility];  Service: Endoscopy;  Laterality: N/A;  . Colon surgery    . Anterior cervical decomp/discectomy fusion  07/11/2012    Procedure: ANTERIOR CERVICAL DECOMPRESSION/DISCECTOMY FUSION 1 LEVEL;  Surgeon: [name], MD;  Location: [facility];  Service: Neurosurgery;  Laterality: N/A;  Cervical Five-Six Anterior Cervical Decompression with Fusion Interbody Prothesis Plating and Bonegraft    FAMHX: Family History  Problem Relation Age of Onset  . Heart attack Mother     @ age 31  . Heart attack Brother 50  . Colon cancer Maternal Aunt   . Mental illness Mother   . Prostate cancer Maternal Grandfather   . Ovarian cancer Maternal Aunt   . Diabetes Mother   . Diabetes    . Hypertension Mother     siblings  . Lupus Sister   . Ovarian cancer Cousin   . Hypertension      Social History:  reports that she has never smoked. She has never used smokeless tobacco. She reports that she does not drink alcohol or use illicit drugs.  Allergies:  Allergies  Allergen Reactions  . Amitriptyline Other (See Comments)    Disoriented.  Marland Kitchen  Hydromorphone Hcl Other (See Comments)    blood pressure drops.  . Prednisone     SENT INTO [facility]     Medications: reviewed  Dg Chest 2 View  01/21/2013   *RADIOLOGY REPORT*  Clinical Data: Mid chest pain radiating down left arm.  CHEST - 2 VIEW  Comparison: Chest x-ray 11/09/2012.  Findings: Lung volumes are normal.  No consolidative airspace disease.  No pleural effusions.  No pneumothorax.  No pulmonary nodule or mass noted.  Pulmonary vasculature and the cardiomediastinal silhouette are within normal limits.   Orthopedic fixation hardware in the lower cervical spine incompletely imaged.  IMPRESSION: 1. No radiographic evidence of acute cardiopulmonary disease.   Original Report Authenticated By: Trudie Reed, M.D.    ROS  As stated in the HPI and negative for all other systems.  Physical Exam  Vitals:Blood pressure 186/77, pulse 81,  temperature 99.4 F (37.4 C), temperature source Oral, resp. rate 18, SpO2 98.00%.  Well appearing middle aged woman, NAD HEENT: Unremarkable Neck:  7 cm JVD, no thyromegally Lymphatics:  No adenopathy Back:  No CVA tenderness Lungs:  Clear with no wheezes HEART:  Regular rate rhythm, no murmurs, no rubs, no clicks, soft s4. Abd:  obese, positive bowel sounds, no organomegally, no rebound, no guarding Ext:  2 plus pulses, no edema, no cyanosis, no clubbing Skin:  No rashes no nodules Neuro:  CN II through XII intact, motor grossly intact  ECG and labs and cxr reviewed  Assessment/Plan: 1. Chest pain with a h/o normal coronary arteries, now resolved. Would repeat cardiac markers. If negative she can be discharged home and instructed to followup with Dr. [name]. I would suggest a mild diuretic. She cannot take HCTZ because it is not compatible with [name]. Would discharge on lasix 20 mg daily and she will need a followup BMET in 2 weeks. 2. HTN - her blood pressure is not well controlled. I would increase coreg back to 18.375 mg twice daily.  3. Atrial fibrillation - continue [name] 4. HA - she may require pain meds. She follows in a specialized headache clinic.  Toni Gowda TaylorMD 01/21/2013, 3:34 PM

## 2013-01-21 NOTE — ED Notes (Signed)
Pt reports 9/10 centralized chest pain with shortness of breath, dizziness, and nausea. Pt reports a history of hypertension and is concerned about her BP today. Pt also complaining of a 10/10 migraine. Pt reports a history of atrial fibrillation and seizures.

## 2013-01-21 NOTE — ED Provider Notes (Addendum)
History     CSN: [id number]  Arrival date & time 01/21/13  1125   First MD Initiated Contact with Patient 01/21/13 1141      Chief Complaint  Patient presents with  . Chest Pain  . Shortness of Breath  . Hypertension    (Consider location/radiation/quality/duration/timing/severity/associated sxs/prior treatment) HPI Comments: Patient history of diabetes hypertension hyperlipidemia and paroxysmal atrial fibrillation presents with chest pain. She states it started about 30 minutes prior to arrival and is been constant since then. She describes it as a feeling of someone sitting on her chest. She states it goes across her chest and radiates to her left arm. She has had some associated nausea vomiting and shortness of breath with the discomfort. She last had a cardiac catheterization in 2012 which showed normal coronary arteries. She's followed by Dr. [name] with [facility] cardiology. She has not taken anything at home for the pain.  Patient is a 62 y.o. female presenting with chest pain, shortness of breath, and hypertension.  Chest Pain Associated symptoms: fatigue, nausea, shortness of breath and vomiting   Associated symptoms: no abdominal pain, no back pain, no cough, no diaphoresis, no dizziness, no fever, no headache, no numbness and no weakness   Shortness of Breath Associated symptoms: chest pain and vomiting   Associated symptoms: no abdominal pain, no cough, no diaphoresis, no fever, no headaches and no rash   Hypertension Associated symptoms include chest pain and shortness of breath. Pertinent negatives include no abdominal pain and no headaches.    Past Medical History  Diagnosis Date  . Diabetes mellitus     type 2  . Hypertension   . Hyperlipidemia   . LVH (left ventricular hypertrophy)   . Arthritis   . Colon polyps   . Diverticulosis   . Gallstones   . IBS (irritable bowel syndrome)   . Kidney stones   . Myocardial infarction   . Anginal pain     OCT 2013  .  Paroxysmal a-fib     coumadin ( Chads2=2) Echo 09/05/10 EF 55-60%; mod LVH  . Depression   . 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 Sarcoidosis of lung     NONACTIVE      SEES DR UXLK  . Seizures     YEARS AGO NONE SINCE TEENS  . Chronic headaches   . Fibromyalgia   . Peripheral neuropathy     Past Surgical History  Procedure Laterality Date  . Left ankle fracture      closed reduction March 2008  . Wrist arthroscopy      June 2003  . Carpal tunnel release  2003    bilateral  . Subcutaneous transposition of left ulnar nerve    . Abdominal hysterectomy    . Cholecystectomy    . Elbow surgery      bilateral  . Esophageal manometry  03/21/2012    Procedure: ESOPHAGEAL MANOMETRY (EM);  Surgeon: [name], MD;  Location: [facility];  Service: Endoscopy;  Laterality: N/A;  . Colon surgery    . Anterior cervical decomp/discectomy fusion  07/11/2012    Procedure: ANTERIOR CERVICAL DECOMPRESSION/DISCECTOMY FUSION 1 LEVEL;  Surgeon: [name], MD;  Location: [facility];  Service: Neurosurgery;  Laterality: N/A;  Cervical Five-Six Anterior Cervical Decompression with Fusion Interbody Prothesis Plating and Bonegraft    Family History  Problem Relation Age of Onset  . Heart attack Mother     @ age 56  . Heart  attack Brother 50  . Colon cancer Maternal Aunt   . Mental illness Mother   . Prostate cancer Maternal Grandfather   . Ovarian cancer Maternal Aunt   . Diabetes Mother   . Diabetes    . Hypertension Mother     siblings  . Lupus Sister   . Ovarian cancer Cousin   . Hypertension      History  Substance Use Topics  . Smoking status: Never Smoker   . Smokeless tobacco: Never Used  . Alcohol Use: No    OB History   Grav Para Term Preterm Abortions TAB SAB Ect Mult Living                  Review of Systems  Constitutional: Positive for fatigue. Negative for fever, chills and diaphoresis.  HENT: Negative for congestion, rhinorrhea and sneezing.   Eyes: Negative.    Respiratory: Positive for shortness of breath. Negative for cough and chest tightness.   Cardiovascular: Positive for chest pain. Negative for leg swelling.  Gastrointestinal: Positive for nausea and vomiting. Negative for abdominal pain, diarrhea and blood in stool.  Genitourinary: Negative for frequency, hematuria, flank pain and difficulty urinating.  Musculoskeletal: Negative for back pain and arthralgias.  Skin: Negative for rash.  Neurological: Negative for dizziness, speech difficulty, weakness, numbness and headaches.    Allergies  Amitriptyline; Hydromorphone hcl; and Prednisone  Home Medications   Current Outpatient Rx  Name  Route  Sig  Dispense  Refill  . albuterol (PROVENTIL HFA;VENTOLIN HFA) 108 (90 BASE) MCG/ACT inhaler   Inhalation   Inhale 2 puffs into the lungs every 4 (four) hours as needed for wheezing or shortness of breath.         Marland Kitchen aspirin EC 81 MG tablet   Oral   Take 81 mg by mouth daily.         Marland Kitchen atorvastatin (LIPITOR) 10 MG tablet   Oral   Take 10 mg by mouth daily.         . budesonide-formoterol (SYMBICORT) 80-4.5 MCG/ACT inhaler   Inhalation   Inhale 2 puffs into the lungs 2 (two) times daily.         . calcium carbonate (OS-CAL) 600 MG TABS   Oral   Take 1 tablet (600 mg total) by mouth daily.         . carvedilol (COREG) 12.5 MG tablet      Take 1 and 1/2 tablet in the am and 1 tablet in the pm   75 tablet   4     Decrease in dose   . cholecalciferol (VITAMIN D) 1000 UNITS tablet   Oral   Take 1,000 Units by mouth 2 (two) times daily.          . clonazePAM (KLONOPIN) 0.5 MG tablet   Oral   Take 0.5 mg by mouth at bedtime.         . Cranberry 500 MG CAPS   Oral   Take 500 mg by mouth 2 (two) times daily.         Marland Kitchen docusate sodium (COLACE) 100 MG capsule   Oral   Take 100 mg by mouth 2 (two) times daily as needed for constipation. For stool softner         . dofetilide ([name]) 250 MCG capsule   Oral    Take 1 capsule (250 mcg total) by mouth 2 (two) times daily.   180 capsule   4   .  DULoxetine (CYMBALTA) 60 MG capsule   Oral   Take 60 mg by mouth daily.         Marland Kitchen esomeprazole (NEXIUM) 40 MG capsule   Oral   Take 40 mg by mouth 2 (two) times daily.         . ferrous sulfate 325 (65 FE) MG tablet   Oral   Take 325 mg by mouth daily with breakfast.         . gabapentin (NEURONTIN) 600 MG tablet   Oral   Take 600 mg by mouth 3 (three) times daily.         . insulin glargine (LANTUS) 100 UNIT/ML injection   Subcutaneous   Inject 40 Units into the skin at bedtime.         . levETIRAcetam (KEPPRA) 500 MG tablet   Oral   Take 500 mg by mouth every 12 (twelve) hours.         . Liraglutide (VICTOZA) 18 MG/3ML SOLN   Subcutaneous   Inject 1.8 mg into the skin daily.         Marland Kitchen loratadine (CLARITIN) 10 MG tablet   Oral   Take 10 mg by mouth every morning.         . metFORMIN (GLUCOPHAGE) 500 MG tablet   Oral   Take 500 mg by mouth 2 (two) times daily with a meal.         . niacin (NIASPAN) 500 MG CR tablet   Oral   Take 500 mg by mouth at bedtime.          Marland Kitchen NITROSTAT 0.4 MG SL tablet      place 1 tablet under the tongue if needed every 5 minutes for chest pain for 3 doses IF NO RELIEF AFTER 3RD DOSE CALL PRESCRIBER OR 911.   25 each   12   . ondansetron (ZOFRAN ODT) 8 MG disintegrating tablet      8mg  ODT q4 hours prn nausea   20 tablet   0   . oxyCODONE-acetaminophen (PERCOCET) 10-325 MG per tablet   Oral   Take 1 tablet by mouth every 4 (four) hours as needed for pain.         . potassium chloride (K-DUR,KLOR-CON) 10 MEQ tablet   Oral   Take 10 mEq by mouth daily.         . rizatriptan (MAXALT) 10 MG tablet   Oral   Take 10 mg by mouth as needed. For migraines. May repeat in 2 hours if needed.         . vitamin C (ASCORBIC ACID) 500 MG tablet   Oral   Take 500 mg by mouth 2 (two) times daily.         . promethazine  (PHENERGAN) 25 MG tablet   Oral   Take 1 tablet (25 mg total) by mouth every 6 (six) hours as needed for nausea.   10 tablet   0     BP 152/68  Pulse 64  Temp(Src) 98 F (36.7 C) (Oral)  Resp 19  SpO2 94%  Physical Exam  Constitutional: She is oriented to person, place, and time. She appears well-developed and well-nourished.  HENT:  Head: Normocephalic and atraumatic.  Eyes: Pupils are equal, round, and reactive to light.  Neck: Normal range of motion. Neck supple.  Cardiovascular: Normal rate, regular rhythm and normal heart sounds.   Pulmonary/Chest: Effort normal and breath sounds normal. No respiratory distress. She has no  wheezes. She has no rales. She exhibits no tenderness.  Abdominal: Soft. Bowel sounds are normal. There is no tenderness. There is no rebound and no guarding.  Musculoskeletal: Normal range of motion. She exhibits no edema.  Lymphadenopathy:    She has no cervical adenopathy.  Neurological: She is alert and oriented to person, place, and time.  Skin: Skin is warm and dry. No rash noted.  Psychiatric: She has a normal mood and affect.    ED Course  Procedures (including critical care time)  Results for orders placed during the hospital encounter of 01/21/13  CBC      Result Value Range   WBC 3.2 (*) 4.0 - 10.5 K/uL   RBC 4.13  3.87 - 5.11 MIL/uL   Hemoglobin 12.1  12.0 - 15.0 g/dL   HCT 16.1  09.6 - 04.5 %   MCV 87.9  78.0 - 100.0 fL   MCH 29.3  26.0 - 34.0 pg   MCHC 33.3  30.0 - 36.0 g/dL   RDW 40.9  81.1 - 91.4 %   Platelets 184  150 - 400 K/uL  BASIC METABOLIC PANEL      Result Value Range   Sodium 140  135 - 145 mEq/L   Potassium 3.8  3.5 - 5.1 mEq/L   Chloride 103  96 - 112 mEq/L   CO2 28  19 - 32 mEq/L   Glucose, Bld 100 (*) 70 - 99 mg/dL   BUN 10  6 - 23 mg/dL   Creatinine, Ser 7.82  0.50 - 1.10 mg/dL   Calcium 9.6  8.4 - 95.6 mg/dL   GFR calc non Af Amer 53 (*) >90 mL/min   GFR calc Af Amer 62 (*) >90 mL/min  PRO B NATRIURETIC  PEPTIDE      Result Value Range   Pro B Natriuretic peptide (BNP) 74.8  0 - 125 pg/mL  POCT I-STAT TROPONIN I      Result Value Range   Troponin i, poc 0.00  0.00 - 0.08 ng/mL   Comment 3            Dg Chest 2 View  01/21/2013   *RADIOLOGY REPORT*  Clinical Data: Mid chest pain radiating down left arm.  CHEST - 2 VIEW  Comparison: Chest x-ray 11/09/2012.  Findings: Lung volumes are normal.  No consolidative airspace disease.  No pleural effusions.  No pneumothorax.  No pulmonary nodule or mass noted.  Pulmonary vasculature and the cardiomediastinal silhouette are within normal limits.   Orthopedic fixation hardware in the lower cervical spine incompletely imaged.  IMPRESSION: 1. No radiographic evidence of acute cardiopulmonary disease.   Original Report Authenticated By: Trudie Reed, M.D.       Date: 01/21/2013  Rate: 68  Rhythm: normal sinus rhythm  QRS Axis: normal  Intervals: normal  ST/T Wave abnormalities: nonspecific ST/T changes  Conduction Disutrbances:none  Narrative Interpretation:   Old EKG Reviewed: changes notedslightly more T wave flattening inferiorly/laterally   1. Chest pain       MDM  Patient presents with chest pain she does have significant risk factors for coronary artery disease although she did have a normal catheterization 2 years ago. She was given baby aspirin here in the ED and nitroglycerin. Her pain is down to a 1 after the nitroglycerin. I spoke with the [facility] cardiology on-call physician who is going to come and see the patient.  15:35 pt was seen by Dr Ladona Ridgel who thinks that pt can likely go  home.  We wants to check a 2nd troponin which I ordered.  Dr Ladona Ridgel to leave a consult note in the chart, Dr Blinda Leatherwood to f/u on 2nd troponin and discharge as appropriate      Rolan Bucco, MD 01/21/13 1432  Rolan Bucco, MD 01/21/13 1537

## 2013-01-21 NOTE — ED Provider Notes (Signed)
Patient signed out to me by Doctor Fredderick Phenix. Patient was seen initially for chest pain. Chest pain had resolved and initial workup was negative. Cardiology consult was requested. I have reviewed Doctor Taylor's recommendations. A second troponin has been performed and is still negative. As per Doctor Taylor's recommendations, patient can be discharged with medication changes. Will have Lasix 20 mg daily, increase Coreg to 18.375 mg and patient is to followup in the office in 2 weeks for recheck.  Gilda Crease, MD 01/21/13 [phone]

## 2013-01-24 ENCOUNTER — Encounter (HOSPITAL_COMMUNITY): Payer: Self-pay | Admitting: Emergency Medicine

## 2013-01-24 ENCOUNTER — Emergency Department (HOSPITAL_COMMUNITY)
Admission: EM | Admit: 2013-01-24 | Discharge: 2013-01-24 | Disposition: A | Discharge status: Home / Self Care | Payer: Medicaid Other | Attending: Emergency Medicine | Admitting: Emergency Medicine

## 2013-01-24 DIAGNOSIS — M129 Arthropathy, unspecified: Secondary | ICD-10-CM | POA: Insufficient documentation

## 2013-01-24 DIAGNOSIS — G40909 Epilepsy, unspecified, not intractable, without status epilepticus: Secondary | ICD-10-CM | POA: Insufficient documentation

## 2013-01-24 DIAGNOSIS — R5381 Other malaise: Secondary | ICD-10-CM | POA: Insufficient documentation

## 2013-01-24 DIAGNOSIS — F3289 Other specified depressive episodes: Secondary | ICD-10-CM | POA: Insufficient documentation

## 2013-01-24 DIAGNOSIS — R55 Syncope and collapse: Secondary | ICD-10-CM | POA: Insufficient documentation

## 2013-01-24 DIAGNOSIS — I252 Old myocardial infarction: Secondary | ICD-10-CM | POA: Insufficient documentation

## 2013-01-24 DIAGNOSIS — Z79899 Other long term (current) drug therapy: Secondary | ICD-10-CM | POA: Insufficient documentation

## 2013-01-24 DIAGNOSIS — Z794 Long term (current) use of insulin: Secondary | ICD-10-CM | POA: Insufficient documentation

## 2013-01-24 DIAGNOSIS — Z8679 Personal history of other diseases of the circulatory system: Secondary | ICD-10-CM | POA: Insufficient documentation

## 2013-01-24 DIAGNOSIS — E785 Hyperlipidemia, unspecified: Secondary | ICD-10-CM | POA: Insufficient documentation

## 2013-01-24 DIAGNOSIS — J45909 Unspecified asthma, uncomplicated: Secondary | ICD-10-CM | POA: Insufficient documentation

## 2013-01-24 DIAGNOSIS — R1084 Generalized abdominal pain: Secondary | ICD-10-CM | POA: Insufficient documentation

## 2013-01-24 DIAGNOSIS — Z8669 Personal history of other diseases of the nervous system and sense organs: Secondary | ICD-10-CM | POA: Insufficient documentation

## 2013-01-24 DIAGNOSIS — E119 Type 2 diabetes mellitus without complications: Secondary | ICD-10-CM | POA: Insufficient documentation

## 2013-01-24 DIAGNOSIS — Z860100 Personal history of colon polyps, unspecified: Secondary | ICD-10-CM | POA: Insufficient documentation

## 2013-01-24 DIAGNOSIS — Z87442 Personal history of urinary calculi: Secondary | ICD-10-CM | POA: Insufficient documentation

## 2013-01-24 DIAGNOSIS — F329 Major depressive disorder, single episode, unspecified: Secondary | ICD-10-CM | POA: Insufficient documentation

## 2013-01-24 DIAGNOSIS — R51 Headache: Secondary | ICD-10-CM | POA: Insufficient documentation

## 2013-01-24 DIAGNOSIS — R112 Nausea with vomiting, unspecified: Secondary | ICD-10-CM | POA: Insufficient documentation

## 2013-01-24 DIAGNOSIS — R197 Diarrhea, unspecified: Secondary | ICD-10-CM | POA: Insufficient documentation

## 2013-01-24 DIAGNOSIS — Z8709 Personal history of other diseases of the respiratory system: Secondary | ICD-10-CM | POA: Insufficient documentation

## 2013-01-24 DIAGNOSIS — IMO0001 Reserved for inherently not codable concepts without codable children: Secondary | ICD-10-CM | POA: Insufficient documentation

## 2013-01-24 DIAGNOSIS — I10 Essential (primary) hypertension: Secondary | ICD-10-CM | POA: Insufficient documentation

## 2013-01-24 DIAGNOSIS — Z8619 Personal history of other infectious and parasitic diseases: Secondary | ICD-10-CM | POA: Insufficient documentation

## 2013-01-24 DIAGNOSIS — Z8719 Personal history of other diseases of the digestive system: Secondary | ICD-10-CM | POA: Insufficient documentation

## 2013-01-24 DIAGNOSIS — Z7982 Long term (current) use of aspirin: Secondary | ICD-10-CM | POA: Insufficient documentation

## 2013-01-24 LAB — CBC
HCT: 40.3 % (ref 36.0–46.0)
Hemoglobin: 13.6 g/dL (ref 12.0–15.0)
MCHC: 33.7 g/dL (ref 30.0–36.0)
MCV: 87.8 fL (ref 78.0–100.0)
RDW: 13.8 % (ref 11.5–15.5)
WBC: 6.1 10*3/uL (ref 4.0–10.5)

## 2013-01-24 LAB — BASIC METABOLIC PANEL
BUN: 14 mg/dL (ref 6–23)
Chloride: 102 mEq/L (ref 96–112)
Creatinine, Ser: 1.24 mg/dL — ABNORMAL HIGH (ref 0.50–1.10)
GFR calc Af Amer: 54 mL/min — ABNORMAL LOW (ref >90)
Glucose, Bld: 92 mg/dL (ref 70–99)

## 2013-01-24 LAB — POCT I-STAT TROPONIN I: Troponin i, poc: 0.01 ng/mL (ref 0.00–0.08)

## 2013-01-24 LAB — URINE MICROSCOPIC-ADD ON

## 2013-01-24 LAB — URINALYSIS, ROUTINE W REFLEX MICROSCOPIC
Glucose, UA: NEGATIVE mg/dL
Hgb urine dipstick: NEGATIVE
Protein, ur: 30 mg/dL — AB
pH: 5.5 (ref 5.0–8.0)

## 2013-01-24 MED ORDER — SODIUM CHLORIDE 0.9 % IV BOLUS (SEPSIS)
1000.0000 mL | Freq: Once | INTRAVENOUS | Status: AC
  Administered 2013-01-24: 1000 mL via INTRAVENOUS

## 2013-01-24 NOTE — ED Provider Notes (Signed)
History     CSN: [id number]  Arrival date & time 01/24/13  0913   First MD Initiated Contact with Patient 01/24/13 [phone]      Chief Complaint  Patient presents with  . Vomiting  . Loss of Consciousness    (Consider location/radiation/quality/duration/timing/severity/associated sxs/prior treatment) HPI Patient presents with concern of ongoing nausea, vomiting, diarrhea, weakness and a new episode of syncope. She does not currently have chest pain. She states that since a recent evaluation here for chest pain she has continued to have the aforementioned symptoms. She is particularly concerned of increasing weakness concurrent with her persistent nausea, vomiting, diarrhea. She denies fevers, chills. She states that today, in the hours prior to presentation she felt lightheaded, laid down on her bed, lost consciousness, and awoke, then called her daughter. She states that she took her blood pressure prior to this event found it to be low, in 90s systolic. She had no chest pain either before or after the event. She does have a headache, diffuse, sore. She states that since discharge she has been compliant with her medication, which include increase of her Coreg, and provision of Lasix. Past Medical History  Diagnosis Date  . Diabetes mellitus     type 2  . Hypertension   . Hyperlipidemia   . LVH (left ventricular hypertrophy)   . Arthritis   . Colon polyps   . Diverticulosis   . Gallstones   . IBS (irritable bowel syndrome)   . Kidney stones   . Myocardial infarction   . Anginal pain     OCT 2013  . Paroxysmal a-fib     coumadin ( Chads2=2) Echo 09/05/10 EF 55-60%; mod LVH  . Depression   . 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 Sarcoidosis of lung     NONACTIVE      SEES DR EXBM  . Seizures     YEARS AGO NONE SINCE TEENS  . Chronic headaches   . Fibromyalgia   . Peripheral neuropathy     Past Surgical History  Procedure Laterality Date  . Left ankle fracture      closed  reduction March 2008  . Wrist arthroscopy      June 2003  . Carpal tunnel release  2003    bilateral  . Subcutaneous transposition of left ulnar nerve    . Abdominal hysterectomy    . Cholecystectomy    . Elbow surgery      bilateral  . Esophageal manometry  03/21/2012    Procedure: ESOPHAGEAL MANOMETRY (EM);  Surgeon: [name], MD;  Location: [facility];  Service: Endoscopy;  Laterality: N/A;  . Colon surgery    . Anterior cervical decomp/discectomy fusion  07/11/2012    Procedure: ANTERIOR CERVICAL DECOMPRESSION/DISCECTOMY FUSION 1 LEVEL;  Surgeon: [name], MD;  Location: [facility];  Service: Neurosurgery;  Laterality: N/A;  Cervical Five-Six Anterior Cervical Decompression with Fusion Interbody Prothesis Plating and Bonegraft    Family History  Problem Relation Age of Onset  . Heart attack Mother     @ age 90  . Heart attack Brother 50  . Colon cancer Maternal Aunt   . Mental illness Mother   . Prostate cancer Maternal Grandfather   . Ovarian cancer Maternal Aunt   . Diabetes Mother   . Diabetes    . Hypertension Mother     siblings  . Lupus Sister   . Ovarian cancer Cousin   . Hypertension  History  Substance Use Topics  . Smoking status: Never Smoker   . Smokeless tobacco: Never Used  . Alcohol Use: No    OB History   Grav Para Term Preterm Abortions TAB SAB Ect Mult Living                  Review of Systems  Constitutional:       Per HPI, otherwise negative  HENT:       Per HPI, otherwise negative  Respiratory:       Per HPI, otherwise negative  Cardiovascular:       Per HPI, otherwise negative  Gastrointestinal: Positive for nausea, vomiting, abdominal pain and diarrhea.  Endocrine:       Negative aside from HPI  Genitourinary:       Neg aside from HPI   Musculoskeletal:       Per HPI, otherwise negative  Skin: Negative.   Neurological: Positive for syncope and headaches. Negative for dizziness and weakness.     Allergies  Amitriptyline; Hydromorphone hcl; and Prednisone  Home Medications   Current Outpatient Rx  Name  Route  Sig  Dispense  Refill  . albuterol (PROVENTIL HFA;VENTOLIN HFA) 108 (90 BASE) MCG/ACT inhaler   Inhalation   Inhale 2 puffs into the lungs every 4 (four) hours as needed for wheezing or shortness of breath.         Marland Kitchen aspirin EC 81 MG tablet   Oral   Take 81 mg by mouth daily.         Marland Kitchen atorvastatin (LIPITOR) 10 MG tablet   Oral   Take 10 mg by mouth daily.         . budesonide-formoterol (SYMBICORT) 80-4.5 MCG/ACT inhaler   Inhalation   Inhale 2 puffs into the lungs 2 (two) times daily.         . calcium carbonate (OS-CAL) 600 MG TABS   Oral   Take 1 tablet (600 mg total) by mouth daily.         . carvedilol (COREG) 12.5 MG tablet      Take 1 and 1/2 tablet in the am and 1 tablet in the pm   75 tablet   4     Decrease in dose   . carvedilol (COREG) 12.5 MG tablet   Oral   Take 1.5 tablets (18.75 mg total) by mouth 2 (two) times daily with a meal.   45 tablet   0   . cholecalciferol (VITAMIN D) 1000 UNITS tablet   Oral   Take 1,000 Units by mouth 2 (two) times daily.          . clonazePAM (KLONOPIN) 0.5 MG tablet   Oral   Take 0.5 mg by mouth at bedtime.         . Cranberry 500 MG CAPS   Oral   Take 500 mg by mouth 2 (two) times daily.         Marland Kitchen docusate sodium (COLACE) 100 MG capsule   Oral   Take 100 mg by mouth 2 (two) times daily as needed for constipation. For stool softner         . dofetilide ([name]) 250 MCG capsule   Oral   Take 1 capsule (250 mcg total) by mouth 2 (two) times daily.   180 capsule   4   . DULoxetine (CYMBALTA) 60 MG capsule   Oral   Take 60 mg by mouth daily.         Marland Kitchen  esomeprazole (NEXIUM) 40 MG capsule   Oral   Take 40 mg by mouth 2 (two) times daily.         . ferrous sulfate 325 (65 FE) MG tablet   Oral   Take 325 mg by mouth daily with breakfast.         . furosemide  (LASIX) 20 MG tablet   Oral   Take 1 tablet (20 mg total) by mouth 2 (two) times daily.   30 tablet   0   . gabapentin (NEURONTIN) 600 MG tablet   Oral   Take 600 mg by mouth 3 (three) times daily.         . insulin glargine (LANTUS) 100 UNIT/ML injection   Subcutaneous   Inject 40 Units into the skin at bedtime.         . levETIRAcetam (KEPPRA) 500 MG tablet   Oral   Take 500 mg by mouth every 12 (twelve) hours.         . Liraglutide (VICTOZA) 18 MG/3ML SOLN   Subcutaneous   Inject 1.8 mg into the skin daily.         Marland Kitchen loratadine (CLARITIN) 10 MG tablet   Oral   Take 10 mg by mouth every morning.         . metFORMIN (GLUCOPHAGE) 500 MG tablet   Oral   Take 500 mg by mouth 2 (two) times daily with a meal.         . niacin (NIASPAN) 500 MG CR tablet   Oral   Take 500 mg by mouth at bedtime.          Marland Kitchen NITROSTAT 0.4 MG SL tablet      place 1 tablet under the tongue if needed every 5 minutes for chest pain for 3 doses IF NO RELIEF AFTER 3RD DOSE CALL PRESCRIBER OR 911.   25 each   12   . ondansetron (ZOFRAN ODT) 8 MG disintegrating tablet      8mg  ODT q4 hours prn nausea   20 tablet   0   . oxyCODONE-acetaminophen (PERCOCET) 10-325 MG per tablet   Oral   Take 1 tablet by mouth every 4 (four) hours as needed for pain.         Marland Kitchen EXPIRED: potassium chloride (K-DUR,KLOR-CON) 10 MEQ tablet   Oral   Take 10 mEq by mouth daily.         . promethazine (PHENERGAN) 25 MG tablet   Oral   Take 1 tablet (25 mg total) by mouth every 6 (six) hours as needed for nausea.   10 tablet   0   . rizatriptan (MAXALT) 10 MG tablet   Oral   Take 10 mg by mouth as needed. For migraines. May repeat in 2 hours if needed.         . vitamin C (ASCORBIC ACID) 500 MG tablet   Oral   Take 500 mg by mouth 2 (two) times daily.           BP 110/65  Pulse 78  Temp(Src) 97.6 F (36.4 C) (Oral)  Resp 18  SpO2 98%  Physical Exam  Nursing note and vitals  reviewed. Constitutional: She is oriented to person, place, and time. She appears well-developed and well-nourished. No distress.  HENT:  Head: Normocephalic and atraumatic.  Mouth/Throat: Oropharynx is clear and moist.  Eyes: Pupils are equal, round, and reactive to light.  Neck: Normal range of motion. Neck supple.  Cardiovascular:  Normal rate, regular rhythm and normal heart sounds.  Exam reveals no gallop and no friction rub.   No murmur heard. Pulmonary/Chest: Effort normal and breath sounds normal. No respiratory distress.  Abdominal: Soft. Normal appearance and bowel sounds are normal. She exhibits no distension. There is no hepatosplenomegaly. There is generalized tenderness. There is no rigidity, no rebound and no guarding.  Neurological: She is alert and oriented to person, place, and time. She displays no atrophy and no tremor. No cranial nerve deficit or sensory deficit. She exhibits normal muscle tone. She displays no seizure activity. Coordination normal.  Skin: Skin is warm and dry.    ED Course  Procedures (including critical care time)  Labs Reviewed  CBC  BASIC METABOLIC PANEL  PROTIME-INR  URINALYSIS, ROUTINE W REFLEX MICROSCOPIC   No results found.   No diagnosis found.   After the initial evaluation I reviewed the patient's chart, including cardiology consult from 2 days ago.. The patient has a normal catheterization 2 years ago.   Cardiac: 64 sr, normal  O2- 99%ra, normal  12:38 PM Patient sitting upright, seemingly in no distress.  I discussed results thus far with her and her family.  2:18 PM Patient appears calm.  She is drinking liquids.  We discussed all results, including her evidence of ketonuria.  She has a scheduled followup with both her primary care physician and a cardiologist in 2 weeks.  We discussed stopping one of the 2 medication adjustments she made several days ago in an attempt to improve her blood pressure, diminish the  dehydration.  MDM  Patient presents after an episode of nausea, vomiting, possible syncope.  The patient was recently here, and discharged with new medication.  Given some consideration of her ketonuria, possible hypotension, possible medication related lightheadedness, one of the 2 medication changes recently made was stopped in an attempt to improve this. Otherwise, the patient's recent catheterization, her relatively unremarkable labs will today and last assessment is dehydration is reassuring, and she preferred discharge with continued evaluation as an outpatient the        Gerhard Munch, MD 01/24/13 1419

## 2013-01-24 NOTE — ED Notes (Signed)
Pt c/o N/V this am with possible syncope

## 2013-01-25 LAB — URINE CULTURE: Colony Count: NO GROWTH

## 2013-01-30 ENCOUNTER — Other Ambulatory Visit: Payer: Self-pay | Admitting: *Deleted

## 2013-02-01 ENCOUNTER — Other Ambulatory Visit: Payer: Self-pay | Admitting: *Deleted

## 2013-02-02 NOTE — Telephone Encounter (Signed)
PER SHERRY   SPOKE WITH   SOMEONE FROM THIS OFFICE  AND MESSAGE WAS ADDRESSED./CY

## 2013-02-07 ENCOUNTER — Encounter: Payer: Self-pay | Admitting: Nurse Practitioner

## 2013-02-07 ENCOUNTER — Ambulatory Visit (INDEPENDENT_AMBULATORY_CARE_PROVIDER_SITE_OTHER): Payer: Medicaid Other | Admitting: Nurse Practitioner

## 2013-02-07 VITALS — BP 160/70 | HR 68 | Ht 66.0 in | Wt 194.4 lb

## 2013-02-07 DIAGNOSIS — I10 Essential (primary) hypertension: Secondary | ICD-10-CM

## 2013-02-07 NOTE — Progress Notes (Signed)
[name] Date of Birth: [birth year] Medical Record #[id number]  History of Present Illness: [name] is seen back today for a post ER visit. Seen for [name]. She has a history of normal coronaries per cath in 2012, HTN, HLD, type 2 DM, LVH, OA, IBS, PAF on tikosyn, sarcoid, chronic headaches and depression.   Was most recently in the ER with an elevated BP. Her dose of Coreg had been reduced back in April. She had headache and chest pressure with her elevated BP. Markers were negative. Low dose diuretic was added (lasix since she is on [name]) and the Coreg was increased back up.   She presented back to the ER 3 days later with nausea, vomiting and diarrhea and a syncopal spell. She had gotten lightheaded, went to lie down on her, lost consciousness, awoke and then called her daughter. Prior to this event, BP was down to be in the 90's systolic. Still had a headache. The Coreg was cut back again and the Lasix was stopped.   Comes in today. She is here alone. She is doing fine. No more spells. Has been back to see [name] yesterday - BP was 129/74. She has no chest pain. Not short of breath. Not dizzy. Tries to watch her salt.    Current Outpatient Prescriptions  Medication Sig Dispense Refill  . albuterol (PROVENTIL HFA;VENTOLIN HFA) 108 (90 BASE) MCG/ACT inhaler Inhale 2 puffs into the lungs every 4 (four) hours as needed for wheezing or shortness of breath.      Marland Kitchen aspirin EC 81 MG tablet Take 81 mg by mouth daily.      Marland Kitchen atorvastatin (LIPITOR) 10 MG tablet Take 10 mg by mouth daily.      . budesonide-formoterol (SYMBICORT) 80-4.5 MCG/ACT inhaler Inhale 2 puffs into the lungs 2 (two) times daily.      . calcium carbonate (OS-CAL) 600 MG TABS Take 1 tablet (600 mg total) by mouth daily.      . carvedilol (COREG) 12.5 MG tablet Take 12.5-18.75 mg by mouth 2 (two) times daily with a meal. Takes 1.5 tablets every morning and takes 1 tablets every evening.      . cholecalciferol  (VITAMIN D) 1000 UNITS tablet Take 1,000 Units by mouth 2 (two) times daily.       . clonazePAM (KLONOPIN) 0.5 MG tablet Take 0.5 mg by mouth at bedtime.      . Cranberry 500 MG CAPS Take 500 mg by mouth 2 (two) times daily.      Marland Kitchen docusate sodium (COLACE) 100 MG capsule Take 100 mg by mouth 2 (two) times daily as needed for constipation. For stool softner      . dofetilide ([name]) 250 MCG capsule Take 1 capsule (250 mcg total) by mouth 2 (two) times daily.  180 capsule  4  . DULoxetine (CYMBALTA) 60 MG capsule Take 60 mg by mouth daily.      Marland Kitchen esomeprazole (NEXIUM) 40 MG capsule Take 40 mg by mouth 2 (two) times daily.      . ferrous sulfate 325 (65 FE) MG tablet Take 325 mg by mouth daily with breakfast.      . insulin glargine (LANTUS) 100 UNIT/ML injection Inject 40 Units into the skin at bedtime.      . levETIRAcetam (KEPPRA) 500 MG tablet Take 500 mg by mouth every 12 (twelve) hours.      . Liraglutide (VICTOZA) 18 MG/3ML SOLN Inject 1.8 mg into the skin daily.      Marland Kitchen  loratadine (CLARITIN) 10 MG tablet Take 10 mg by mouth every morning.      . metFORMIN (GLUCOPHAGE) 500 MG tablet Take 500 mg by mouth 2 (two) times daily with a meal.      . niacin (NIASPAN) 500 MG CR tablet Take 500 mg by mouth at bedtime.       . nitroGLYCERIN (NITROSTAT) 0.4 MG SL tablet Place 0.4 mg under the tongue every 5 (five) minutes as needed for chest pain.      Marland Kitchen ondansetron (ZOFRAN-ODT) 8 MG disintegrating tablet Take 8 mg by mouth every 6 (six) hours as needed for nausea.      Marland Kitchen oxyCODONE-acetaminophen (PERCOCET) 10-325 MG per tablet Take 1 tablet by mouth every 4 (four) hours as needed for pain.      . promethazine (PHENERGAN) 25 MG tablet Take 1 tablet (25 mg total) by mouth every 6 (six) hours as needed for nausea.  10 tablet  0  . rizatriptan (MAXALT) 10 MG tablet Take 10 mg by mouth daily as needed for migraine. For migraines. May repeat in 2 hours if needed.      . vitamin C (ASCORBIC ACID) 500 MG tablet  Take 500 mg by mouth 2 (two) times daily.      . pregabalin (LYRICA) 25 MG capsule Take 25 mg by mouth 2 (two) times daily.       No current facility-administered medications for this visit.    Allergies  Allergen Reactions  . Amitriptyline Other (See Comments)    Disoriented.  . Hydromorphone Hcl Other (See Comments)    blood pressure drops.  . Prednisone     SENT INTO AFIB     Past Medical History  Diagnosis Date  . Diabetes mellitus     type 2  . Hypertension   . Hyperlipidemia   . LVH (left ventricular hypertrophy)   . Arthritis   . Colon polyps   . Diverticulosis   . Gallstones   . IBS (irritable bowel syndrome)   . Kidney stones   . Myocardial infarction   . Anginal pain     OCT 2013  . Paroxysmal a-fib     coumadin ( Chads2=2) Echo 09/05/10 EF 55-60%; mod LVH  . Depression   . Asthma     [name]   . Sarcoidosis of lung     NONACTIVE      SEES [name]  . Seizures     YEARS AGO NONE SINCE TEENS  . Chronic headaches   . Fibromyalgia   . Peripheral neuropathy     Past Surgical History  Procedure Laterality Date  . Left ankle fracture      closed reduction March 2008  . Wrist arthroscopy      June 2003  . Carpal tunnel release  2003    bilateral  . Subcutaneous transposition of left ulnar nerve    . Abdominal hysterectomy    . Cholecystectomy    . Elbow surgery      bilateral  . Esophageal manometry  03/21/2012    Procedure: ESOPHAGEAL MANOMETRY (EM);  Surgeon: [name], MD;  Location: [facility];  Service: Endoscopy;  Laterality: N/A;  . Colon surgery    . Anterior cervical decomp/discectomy fusion  07/11/2012    Procedure: ANTERIOR CERVICAL DECOMPRESSION/DISCECTOMY FUSION 1 LEVEL;  Surgeon: [name], MD;  Location: [facility];  Service: Neurosurgery;  Laterality: N/A;  Cervical Five-Six Anterior Cervical Decompression with Fusion Interbody Prothesis Plating and Bonegraft  History  Smoking status  . Never Smoker   Smokeless  tobacco  . Never Used    History  Alcohol Use No    Family History  Problem Relation Age of Onset  . Heart attack Mother     @ age 19  . Heart attack Brother 50  . Colon cancer Maternal Aunt   . Mental illness Mother   . Prostate cancer Maternal Grandfather   . Ovarian cancer Maternal Aunt   . Diabetes Mother   . Diabetes    . Hypertension Mother     siblings  . Lupus Sister   . Ovarian cancer Cousin   . Hypertension      Review of Systems: The review of systems is per the HPI. Has chronic headaches.  All other systems were reviewed and are negative.  Physical Exam: BP 160/70  Pulse 68  Ht 5\' 6"  (1.676 m)  Wt 194 lb 6.4 oz (88.179 kg)  BMI 31.39 kg/m2 Repeat BP by me is down to 130/80.  Patient is very pleasant and in no acute distress. She is obese. Skin is warm and dry. Color is normal.  HEENT is unremarkable. Normocephalic/atraumatic. PERRL. Sclera are nonicteric. Neck is supple. No masses. No JVD. Lungs are clear. Cardiac exam shows a regular rate and rhythm. Abdomen is soft. Extremities are without edema. Gait and ROM are intact. No gross neurologic deficits noted.  LABORATORY DATA: Lab Results  Component Value Date   WBC 6.1 01/24/2013   HGB 13.6 01/24/2013   HCT 40.3 01/24/2013   PLT 205 01/24/2013   GLUCOSE 92 01/24/2013   CHOL  Value: 247        ATP III CLASSIFICATION:  <200     mg/dL   Desirable  086-578  mg/dL   Borderline High  >=469    mg/dL   High       * 02/13/5283   TRIG 427* 09/08/2010   HDL 50 09/08/2010   LDLCALC  Value: UNABLE TO CALCULATE IF TRIGLYCERIDE OVER 400 mg/dL        Total Cholesterol/HDL:CHD Risk Coronary Heart Disease Risk Table                     Men   Women  1/2 Average Risk   3.4   3.3  Average Risk       5.0   4.4  2 X Average Risk   9.6   7.1  3 X Average Risk  23.4   11.0        Use the calculated Patient Ratio above and the CHD Risk Table to determine the patient's CHD Risk.        ATP III CLASSIFICATION (LDL):  <100     mg/dL   Optimal   132-440  mg/dL   Near or Above                    Optimal  130-159  mg/dL   Borderline  102-725  mg/dL   High  >366     mg/dL   Very High 4/40/3474   ALT 22 11/26/2012   AST 26 11/26/2012   NA 141 01/24/2013   K 3.9 01/24/2013   CL 102 01/24/2013   CREATININE 1.24* 01/24/2013   BUN 14 01/24/2013   CO2 26 01/24/2013   TSH 2.648 01/15/2011   INR 0.96 01/24/2013   HGBA1C  Value: 8.4 (NOTE)  According to the ADA Clinical Practice Recommendations for 2011, when HbA1c is used as a screening test:   >=6.5%   Diagnostic of Diabetes Mellitus           (if abnormal result  is confirmed)  5.7-6.4%   Increased risk of developing Diabetes Mellitus  References:Diagnosis and Classification of Diabetes Mellitus,Diabetes Care,2011,34(Suppl 1):S62-S69 and Standards of Medical Care in         Diabetes - 2011,Diabetes Care,2011,34  (Suppl 1):S11-S61.* 09/09/2010   MICROALBUR 1.05 02/07/2010     Assessment / Plan: 1. HTN - BP is better by me. I do not think she needs any medicine changes today. Toni. [place] office has discussed with [name] that [name] would like to follow Nitika's BP and I think we will continue with that plan. We will see her back in May of 2015 unless she has any further issues.   2. History of normal coronaries   3. PAF - on [name] - maintaining sinus rhythm   4. Syncope - in the setting of dehydration, N, V - now off of Lasix and back on the lower dose of Coreg. She has not had recurrence. Further testing not felt to be indicated at this time.   Patient is agreeable to this plan and will call if any problems develop in the interim.   Rosalio Macadamia, RN, ANP-C [facility] [address] [place], [place]  [address]

## 2013-02-07 NOTE — Patient Instructions (Addendum)
Stay on your current medicines  Let us know if you have any more passing out spells  See Dr. [name] as planned next May  We will let Dr. Parke Simmers follow your blood pressure  Call the [facility] office at [phone] if you have any questions, problems or concerns.

## 2013-02-28 ENCOUNTER — Telehealth: Payer: Self-pay | Admitting: Cardiovascular Disease

## 2013-02-28 NOTE — Telephone Encounter (Signed)
New Prob     Requesting samples of [name].

## 2013-02-28 NOTE — Telephone Encounter (Signed)
[name] FOR PT    ORDER #  [id number]  ALLOW  7-10 DAYS FOR DELIVERY./CY

## 2013-03-03 ENCOUNTER — Telehealth: Payer: Self-pay | Admitting: *Deleted

## 2013-03-03 MED ORDER — POTASSIUM CHLORIDE ER 10 MEQ PO TBCR: 10.0000 meq | EXTENDED_RELEASE_TABLET | Freq: Every day | ORAL | Status: DC

## 2013-03-03 NOTE — Telephone Encounter (Signed)
REVIEWED PT'S CHART   NO  INDICATION  KCL WAS STOPPED  REFILL   SENT  IN VIA EPIC  PT  AWARE .Zack Seal

## 2013-03-03 NOTE — Addendum Note (Signed)
Addended by: Scherrie Bateman E on: 03/03/2013 04:34 PM   Modules accepted: Orders

## 2013-03-03 NOTE — Telephone Encounter (Signed)
Pharmacy is requesting Potassium for pt but rx is not longer on list. Was taken off of list 12/22/12 at an appointment with Dr [name] but does not have reason why. Is patient suppose to be on potassium?

## 2013-03-06 ENCOUNTER — Telehealth: Payer: Self-pay | Admitting: *Deleted

## 2013-03-06 NOTE — Telephone Encounter (Signed)
PT NOTIFIED   [name]  HERE  LEFT AT FRONT DESK FOR PT PICK UP .Zack Seal

## 2013-03-21 NOTE — Telephone Encounter (Deleted)
error 

## 2013-04-12 ENCOUNTER — Ambulatory Visit (INDEPENDENT_AMBULATORY_CARE_PROVIDER_SITE_OTHER): Payer: Medicaid Other | Admitting: Nurse Practitioner

## 2013-04-12 ENCOUNTER — Encounter: Payer: Self-pay | Admitting: Nurse Practitioner

## 2013-04-12 VITALS — BP 147/70 | HR 65 | Temp 98.2°F | Ht 66.0 in | Wt 190.5 lb

## 2013-04-12 DIAGNOSIS — G40909 Epilepsy, unspecified, not intractable, without status epilepticus: Secondary | ICD-10-CM

## 2013-04-12 MED ORDER — LEVETIRACETAM 500 MG PO TABS: 500.0000 mg | ORAL_TABLET | Freq: Two times a day (BID) | ORAL | Status: DC

## 2013-04-12 NOTE — Progress Notes (Signed)
[facility]  PATIENT: [name] DOB: 21-Jun-1951   HISTORY FROM: Patient, chart REASON FOR VISIT: follow up for seizures HISTORICAL  CHIEF COMPLAINT:  Chief Complaint  Patient presents with  . Follow-up    HISTORY OF PRESENT ILLNESS: PRIOR HPI 10/10/12 ([facility]): 62 year old in female here for evaluation of seizure disorder. Patient had first seizure of life in the fifth grade. She was diagnosed with seizures and treated with phenobarbital. She had generalized convulsions, tongue biting and incontinence. She had seizures until the ninth grade and then to stop. At some point she was taken off phenobarbital because she no longer had seizures.  2012 patient began to have seizures again. She was having intermittent episodes of staring, being in a days, followed by shaking, tongue biting, vomiting and incontinence. Patient had an episode last night as well.  UPDATE 04/12/13 (LL): Patient returns for followup visit since last visit on 10/10/2012. She states that she has not had any more seizures or syncopal episodes since her last visit. She continues to take Keppra 500 mg twice a day with no known side effects. She reports that in earlier in the year she was placed on Neurontin 600 mg 3 times a day by another doctor and felt very badly.  She reports having visual hallucinations, nausea, excessive diaphoresis.  She had several trips to the emergency room this year for heart rate and blood pressure issues, chest pain, nausea and vomiting, pyelonephritis, and abdominal pain.  She has been evaluated by GI, and [facility].  She was taken off Neurontin which was replaced with Lyrica 50 mg 3 times a day by her pain management M.D. Dr. Kumar 2 months ago. Since this time she reports that she is doing much better. She has no complaints today.  REVIEW OF SYSTEMS: Full 14 system review of systems performed and notable only for:  Constitutional: N/A  Cardiovascular: N/A    Ear/Nose/Throat: N/A  Skin: N/A  Eyes: N/A  Respiratory: N/A  Gastroitestinal: N/A  Hematology/Lymphatic: N/A  Endocrine: N/A Musculoskeletal:N/A  Allergy/Immunology: N/A  Neurological: N/A Psychiatric: N/A   ALLERGIES: Allergies  Allergen Reactions  . Amitriptyline Other (See Comments)    Disoriented.  . Hydromorphone Hcl Other (See Comments)    blood pressure drops.  . Prednisone     SENT INTO AFIB     HOME MEDICATIONS: Outpatient Prescriptions Prior to Visit  Medication Sig Dispense Refill  . albuterol (PROVENTIL HFA;VENTOLIN HFA) 108 (90 BASE) MCG/ACT inhaler Inhale 2 puffs into the lungs every 4 (four) hours as needed for wheezing or shortness of breath.      Marland Kitchen aspirin EC 81 MG tablet Take 81 mg by mouth daily.      Marland Kitchen atorvastatin (LIPITOR) 10 MG tablet Take 10 mg by mouth daily.      . budesonide-formoterol (SYMBICORT) 80-4.5 MCG/ACT inhaler Inhale 2 puffs into the lungs 2 (two) times daily.      . calcium carbonate (OS-CAL) 600 MG TABS Take 1 tablet (600 mg total) by mouth daily.      . carvedilol (COREG) 12.5 MG tablet Take 12.5-18.75 mg by mouth 2 (two) times daily with a meal. Takes 1.5 tablets every morning and takes 1 tablets every evening.      . cholecalciferol (VITAMIN D) 1000 UNITS tablet Take 1,000 Units by mouth 2 (two) times daily.       . Cranberry 500 MG CAPS Take 500 mg by mouth 2 (two) times daily.      Marland Kitchen  docusate sodium (COLACE) 100 MG capsule Take 100 mg by mouth 2 (two) times daily as needed for constipation. For stool softner      . dofetilide ([name]) 250 MCG capsule Take 1 capsule (250 mcg total) by mouth 2 (two) times daily.  180 capsule  4  . DULoxetine (CYMBALTA) 60 MG capsule Take 60 mg by mouth daily.      Marland Kitchen esomeprazole (NEXIUM) 40 MG capsule Take 40 mg by mouth 2 (two) times daily.      . ferrous sulfate 325 (65 FE) MG tablet Take 325 mg by mouth daily with breakfast.      . insulin glargine (LANTUS) 100 UNIT/ML injection Inject 40 Units  into the skin at bedtime.      . Liraglutide (VICTOZA) 18 MG/3ML SOLN Inject 1.8 mg into the skin daily.      Marland Kitchen loratadine (CLARITIN) 10 MG tablet Take 10 mg by mouth every morning.      . metFORMIN (GLUCOPHAGE) 500 MG tablet Take 500 mg by mouth 2 (two) times daily with a meal.      . niacin (NIASPAN) 500 MG CR tablet Take 500 mg by mouth at bedtime.       . nitroGLYCERIN (NITROSTAT) 0.4 MG SL tablet Place 0.4 mg under the tongue every 5 (five) minutes as needed for chest pain.      Marland Kitchen ondansetron (ZOFRAN-ODT) 8 MG disintegrating tablet Take 8 mg by mouth every 6 (six) hours as needed for nausea.      Marland Kitchen oxyCODONE-acetaminophen (PERCOCET) 10-325 MG per tablet Take 1 tablet by mouth every 4 (four) hours as needed for pain.      . potassium chloride (K-DUR) 10 MEQ tablet Take 1 tablet (10 mEq total) by mouth daily.  90 tablet  3  . pregabalin (LYRICA) 25 MG capsule Take 25 mg by mouth 3 (three) times daily.       . promethazine (PHENERGAN) 25 MG tablet Take 1 tablet (25 mg total) by mouth every 6 (six) hours as needed for nausea.  10 tablet  0  . rizatriptan (MAXALT) 10 MG tablet Take 10 mg by mouth daily as needed for migraine. For migraines. May repeat in 2 hours if needed.      . vitamin C (ASCORBIC ACID) 500 MG tablet Take 500 mg by mouth 2 (two) times daily.      Marland Kitchen levETIRAcetam (KEPPRA) 500 MG tablet Take 500 mg by mouth every 12 (twelve) hours.      . clonazePAM (KLONOPIN) 0.5 MG tablet Take 0.5 mg by mouth at bedtime.       No facility-administered medications prior to visit.    PAST MEDICAL HISTORY: Past Medical History  Diagnosis Date  . Diabetes mellitus     type 2  . Hypertension   . Hyperlipidemia   . LVH (left ventricular hypertrophy)   . Arthritis   . Colon polyps   . Diverticulosis   . Gallstones   . IBS (irritable bowel syndrome)   . Kidney stones   . Myocardial infarction   . Anginal pain     OCT 2013  . Paroxysmal a-fib     coumadin ( Chads2=2) Echo 09/05/10 EF  55-60%; mod LVH  . Depression   . 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 Sarcoidosis of lung     NONACTIVE      SEES DR WUJW  . Seizures     YEARS AGO NONE SINCE TEENS  . Chronic  headaches   . Fibromyalgia   . Peripheral neuropathy     PAST SURGICAL HISTORY: Past Surgical History  Procedure Laterality Date  . Left ankle fracture      closed reduction March 2008  . Wrist arthroscopy      June 2003  . Carpal tunnel release  2003    bilateral  . Subcutaneous transposition of left ulnar nerve    . Abdominal hysterectomy    . Cholecystectomy    . Elbow surgery      bilateral  . Esophageal manometry  03/21/2012    Procedure: ESOPHAGEAL MANOMETRY (EM);  Surgeon: [name], MD;  Location: [facility];  Service: Endoscopy;  Laterality: N/A;  . Colon surgery    . Anterior cervical decomp/discectomy fusion  07/11/2012    Procedure: ANTERIOR CERVICAL DECOMPRESSION/DISCECTOMY FUSION 1 LEVEL;  Surgeon: [name], MD;  Location: [facility];  Service: Neurosurgery;  Laterality: N/A;  Cervical Five-Six Anterior Cervical Decompression with Fusion Interbody Prothesis Plating and Bonegraft    FAMILY HISTORY: Family History  Problem Relation Age of Onset  . Heart attack Mother     @ age 87  . Heart attack Brother 50  . Colon cancer Maternal Aunt   . Mental illness Mother   . Prostate cancer Maternal Grandfather   . Ovarian cancer Maternal Aunt   . Diabetes Mother   . Diabetes    . Hypertension Mother     siblings  . Lupus Sister   . Ovarian cancer Cousin   . Hypertension      SOCIAL HISTORY: History   Social History  . Marital Status: Divorced    Spouse Name: N/A    Number of Children: 3  . Years of Education: N/A   Occupational History  . disabled     CNA   Social History Main Topics  . Smoking status: Never Smoker   . Smokeless tobacco: Never Used  . Alcohol Use: No  . Drug Use: No  . Sexual Activity: Not on file   Other Topics Concern  . Not on file   Social  History Narrative   Pt. Lives at home with her daughter. She is single, has three children, does not work currently, and has a 12th grade education level. She has never used tobacco or alcohol. Quit using illicit drugs at the age of 11 and rarely ha caffeine.     PHYSICAL EXAM  Filed Vitals:   04/12/13 0835  BP: 147/70  Pulse: 65  Temp: 98.2 F (36.8 C)  Height: 5\' 6"  (1.676 m)  Weight: 190 lb 8 oz (86.41 kg)   Body mass index is 30.76 kg/(m^2).    Physical Exam  General: Patient is awake, alert and in no acute distress.  Well developed and groomed. Neck: Neck is supple. Cardiovascular: No carotid artery bruits.  Heart is regular rate and rhythm with no murmurs.  Neurologic Exam  Mental Status: Awake, alert. Language is fluent and comprehension intact. Cranial Nerves:  Pupils are equal and reactive to light.  Visual fields are full to confrontation.  Conjugate eye movements are full and symmetric.  Facial sensation and strength are symmetric.  Hearing is intact.  Palate elevated symmetrically and uvula is midline.  Shoulder shrug is symmetric.  Tongue is midline. Motor: Normal bulk and tone.  Full strength in the upper and lower extremities.  No pronator drift. Sensory: DECR IN BLE. Coordination: No ataxia or dysmetria on finger-nose or rapid alternating movement testing. Gait  and Station: ANTALGIC, UNSTEADY GAIT.  Reflexes: Deep tendon reflexes in the upper and lower extremity are TRACE and symmetric.  DIAGNOSTIC DATA (LABS, IMAGING, TESTING) - I reviewed patient records, labs, notes, testing and imaging myself where available.  Lab Results  Component Value Date   WBC 6.1 01/24/2013   HGB 13.6 01/24/2013   HCT 40.3 01/24/2013   MCV 87.8 01/24/2013   PLT 205 01/24/2013      Component Value Date/Time   NA 141 01/24/2013 1150   K 3.9 01/24/2013 1150   CL 102 01/24/2013 1150   CO2 26 01/24/2013 1150   GLUCOSE 92 01/24/2013 1150   BUN 14 01/24/2013 1150   CREATININE 1.24*  01/24/2013 1150   CALCIUM 10.0 01/24/2013 1150   PROT 7.8 11/26/2012 2337   ALBUMIN 4.1 11/26/2012 2337   AST 26 11/26/2012 2337   ALT 22 11/26/2012 2337   ALKPHOS 106 11/26/2012 2337   BILITOT 0.2* 11/26/2012 2337   GFRNONAA 46* 01/24/2013 1150   GFRAA 54* 01/24/2013 1150    10/17/12 MRI BRAIN W/Wo Normal MRI brain (with and without contrast).  ASSESSMENT AND PLAN 62 year old right-handed [place] American female with multiple medical problems seen in our office for SEIZURE DISORDER (ICD-780.39).  No reported seizures since last visit.  PLAN: Continue levetiracetam 500mg  BID No driving until sz free x 6 months  Meds ordered this encounter  Medications  . levETIRAcetam (KEPPRA) 500 MG tablet    Sig: Take 1 tablet (500 mg total) by mouth every 12 (twelve) hours.    Dispense:  60 tablet    Refill:  5    Order Specific Question:  Supervising Provider    Answer:  Suanne Marker [[id number]]    Jaquelyne Firkus NP-C 04/12/2013, 9:09 AM  [facility] [address], [address] [place], [place] [address] [phone]

## 2013-04-12 NOTE — Patient Instructions (Addendum)
Continue current dose of levetiracetam 500 mg BID.  Follow up visit in 6 months.

## 2013-05-26 ENCOUNTER — Telehealth: Payer: Self-pay | Admitting: *Deleted

## 2013-05-26 ENCOUNTER — Telehealth: Payer: Self-pay

## 2013-05-26 NOTE — Telephone Encounter (Signed)
patien called wanting her tikosyn gave to [employer]

## 2013-05-26 NOTE — Telephone Encounter (Signed)
patien called wanting her tikosyn gave to [employer]  [name]  REORDERED  SAMPLES LEFT  AT FRONT  DESK AS PT  WILL RUN OUT ON TUES   MED  WILL NOT  BE HERE  BEFORE  FRI  10.17.14 PT  AWARE .Zack Seal

## 2013-06-01 ENCOUNTER — Other Ambulatory Visit (HOSPITAL_COMMUNITY): Payer: Self-pay | Admitting: Family Medicine

## 2013-06-01 ENCOUNTER — Telehealth: Payer: Self-pay | Admitting: Gastroenterology

## 2013-06-01 DIAGNOSIS — Z1231 Encounter for screening mammogram for malignant neoplasm of breast: Secondary | ICD-10-CM

## 2013-06-01 NOTE — Telephone Encounter (Signed)
Pt has been scheduled for 06/02/13 with Dr Christella Hartigan

## 2013-06-01 NOTE — Telephone Encounter (Signed)
PT  AWARE  [name]  AT  FRONT  DESK FOR PICK UP .[name]

## 2013-06-02 ENCOUNTER — Encounter: Payer: Self-pay | Admitting: Gastroenterology

## 2013-06-02 ENCOUNTER — Ambulatory Visit (INDEPENDENT_AMBULATORY_CARE_PROVIDER_SITE_OTHER): Payer: Medicaid Other | Admitting: Gastroenterology

## 2013-06-02 VITALS — BP 110/62 | HR 80 | Ht 66.0 in | Wt 192.1 lb

## 2013-06-02 DIAGNOSIS — K219 Gastro-esophageal reflux disease without esophagitis: Secondary | ICD-10-CM

## 2013-06-02 NOTE — Progress Notes (Signed)
Review of pertinent gastrointestinal problems:  1. chronic GERD. EGD February 2013 showed mild gastritis, H. pylori negative on biopsy. On proton pump inhibitor once daily and nightly Zantac  2. history of adenomatous polyps: Colonoscopy February 2013 found diverticulosis but no polyps. Recall colonoscopy at 5 year interval given 2008 adenomatous polyps  3. hoarseness, coughing, intermittent shortness of breath; not clear if this is related to her GERD but seems unlikely (5/13); was referred to ear nose and throat physician who ordered MBSS 2013: Clinical impression: Pt demonstrated appearance of a primary esophageal dysphagia. Oral and oropharyngeal function within normal limits with no aspiration or penetration. Esophageal sweep showed appearance of slow emptying of thin liquids though GE juntction. After about 4 oz consumption of liquid barium. Pt began coughing and gagging, expectorating barium tinged secretions. Pt attempted to consume puree and could not due to hard coughing and gagging. Pt would benefit from further assessment by GI. 02/2012 Esophageal manometry was completely normal.   HPI: This is a  pleasant 62 year old woman who is here with her daughter today.   Still hoarse.    Wakes up at night with acid regurge.  Takes nexium twice a day, 30 min before BF and dinner meals.  She was quite sleepy during the examination today had her eyes closed a lot. Answered questions somewhat slowly  No currently on H2 blocker.  No caffeine drinks  Eats at 7:30pm, lays down to bed at about 9:00  She does not drink alcohol, was upset at my asking.   Past Medical History  Diagnosis Date  . Diabetes mellitus     type 2  . Hypertension   . Hyperlipidemia   . LVH (left ventricular hypertrophy)   . Arthritis   . Colon polyps   . Diverticulosis   . Gallstones   . IBS (irritable bowel syndrome)   . Kidney stones   . Myocardial infarction   . Anginal pain     OCT 2013  . Paroxysmal  a-fib     coumadin ( Chads2=2) Echo 09/05/10 EF 55-60%; mod LVH  . Depression   . 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 Sarcoidosis of lung     NONACTIVE      SEES DR WUJW  . Seizures     YEARS AGO NONE SINCE TEENS  . Chronic headaches   . Fibromyalgia   . Peripheral neuropathy     Past Surgical History  Procedure Laterality Date  . Left ankle fracture      closed reduction March 2008  . Wrist arthroscopy      June 2003  . Carpal tunnel release  2003    bilateral  . Subcutaneous transposition of left ulnar nerve    . Abdominal hysterectomy    . Cholecystectomy    . Elbow surgery      bilateral  . Esophageal manometry  03/21/2012    Procedure: ESOPHAGEAL MANOMETRY (EM);  Surgeon: [name], MD;  Location: [facility];  Service: Endoscopy;  Laterality: N/A;  . Colon surgery    . Anterior cervical decomp/discectomy fusion  07/11/2012    Procedure: ANTERIOR CERVICAL DECOMPRESSION/DISCECTOMY FUSION 1 LEVEL;  Surgeon: [name], MD;  Location: [facility];  Service: Neurosurgery;  Laterality: N/A;  Cervical Five-Six Anterior Cervical Decompression with Fusion Interbody Prothesis Plating and Bonegraft    Current Outpatient Prescriptions  Medication Sig Dispense Refill  . albuterol (PROVENTIL HFA;VENTOLIN HFA) 108 (90 BASE) MCG/ACT inhaler Inhale 2 puffs into  the lungs every 4 (four) hours as needed for wheezing or shortness of breath.      Marland Kitchen aspirin EC 81 MG tablet Take 81 mg by mouth daily.      . budesonide-formoterol (SYMBICORT) 80-4.5 MCG/ACT inhaler Inhale 2 puffs into the lungs 2 (two) times daily.      . calcium carbonate (OS-CAL) 600 MG TABS Take 1 tablet (600 mg total) by mouth daily.      . carvedilol (COREG) 12.5 MG tablet Take 12.5-18.75 mg by mouth 2 (two) times daily with a meal. Takes 1.5 tablets every morning and takes 1 tablets every evening.      . cholecalciferol (VITAMIN D) 1000 UNITS tablet Take 1,000 Units by mouth 2 (two) times daily.       . Cranberry 500 MG  CAPS Take 500 mg by mouth 2 (two) times daily.      Marland Kitchen docusate sodium (COLACE) 100 MG capsule Take 100 mg by mouth 2 (two) times daily as needed for constipation. For stool softner      . dofetilide ([name]) 250 MCG capsule Take 1 capsule (250 mcg total) by mouth 2 (two) times daily.  180 capsule  4  . DULoxetine (CYMBALTA) 60 MG capsule Take 60 mg by mouth daily.      Marland Kitchen esomeprazole (NEXIUM) 40 MG capsule Take 40 mg by mouth 2 (two) times daily.      . ferrous sulfate 325 (65 FE) MG tablet Take 325 mg by mouth daily with breakfast.      . insulin glargine (LANTUS) 100 UNIT/ML injection Inject 40 Units into the skin at bedtime.      . levETIRAcetam (KEPPRA) 500 MG tablet Take 1 tablet (500 mg total) by mouth every 12 (twelve) hours.  60 tablet  5  . Liraglutide (VICTOZA) 18 MG/3ML SOLN Inject 1.8 mg into the skin daily.      Marland Kitchen loratadine (CLARITIN) 10 MG tablet Take 10 mg by mouth every morning.      . metFORMIN (GLUCOPHAGE) 500 MG tablet Take 500 mg by mouth 2 (two) times daily with a meal.      . niacin (NIASPAN) 500 MG CR tablet Take 500 mg by mouth at bedtime.       . nitroGLYCERIN (NITROSTAT) 0.4 MG SL tablet Place 0.4 mg under the tongue every 5 (five) minutes as needed for chest pain.      Marland Kitchen ondansetron (ZOFRAN-ODT) 8 MG disintegrating tablet Take 8 mg by mouth every 6 (six) hours as needed for nausea.      Marland Kitchen oxyCODONE-acetaminophen (PERCOCET) 10-325 MG per tablet Take 1 tablet by mouth every 4 (four) hours as needed for pain.      . potassium chloride (K-DUR) 10 MEQ tablet Take 1 tablet (10 mEq total) by mouth daily.  90 tablet  3  . pregabalin (LYRICA) 25 MG capsule Take 25 mg by mouth 3 (three) times daily.       . promethazine (PHENERGAN) 25 MG tablet Take 1 tablet (25 mg total) by mouth every 6 (six) hours as needed for nausea.  10 tablet  0  . rizatriptan (MAXALT) 10 MG tablet Take 10 mg by mouth daily as needed for migraine. For migraines. May repeat in 2 hours if needed.      .  vitamin C (ASCORBIC ACID) 500 MG tablet Take 500 mg by mouth 2 (two) times daily.      Marland Kitchen atorvastatin (LIPITOR) 10 MG tablet Take 10 mg by mouth daily.  No current facility-administered medications for this visit.    Allergies as of 06/02/2013 - Review Complete 06/02/2013  Allergen Reaction Noted  . Amitriptyline Other (See Comments) 08/25/2011  . Hydromorphone hcl Other (See Comments)   . Prednisone  07/07/2012    Family History  Problem Relation Age of Onset  . Heart attack Mother     @ age 45  . Heart attack Brother 50  . Colon cancer Maternal Aunt   . Mental illness Mother   . Prostate cancer Maternal Grandfather   . Ovarian cancer Maternal Aunt   . Diabetes Mother   . Diabetes    . Hypertension Mother     siblings  . Lupus Sister   . Ovarian cancer Cousin   . Hypertension      History   Social History  . Marital Status: Divorced    Spouse Name: N/A    Number of Children: 3  . Years of Education: N/A   Occupational History  . disabled     CNA   Social History Main Topics  . Smoking status: Never Smoker   . Smokeless tobacco: Never Used  . Alcohol Use: No  . Drug Use: No  . Sexual Activity: Not on file   Other Topics Concern  . Not on file   Social History Narrative   Pt. Lives at home with her daughter. She is single, has three children, does not work currently, and has a 12th grade education level. She has never used tobacco or alcohol. Quit using illicit drugs at the age of 81 and rarely ha caffeine.      Physical Exam: Ht 5\' 6"  (1.676 m)  Wt 192 lb 2 oz (87.147 kg)  BMI 31.02 kg/m2 Constitutional: generally well-appearing Psychiatric: alert and oriented x3 Abdomen: soft, nontender, nondistended, no obvious ascites, no peritoneal signs, normal bowel sounds     Assessment and plan: 62 y.o. female with water brash, acid regurgitation night  She is currently on proton pump inhibitor twice daily and taking it for time and relation to  eating. She is going to add nightly H2 blocker at bedtime. She is also going to try to modify the amount of time between 80 and laying down for bed. She will call to report on her symptoms in 4-5 weeks and sooner if needed. She had EGD in 2013 I do not think that needs to be repeated now. She has no alarm symptoms.

## 2013-06-02 NOTE — Patient Instructions (Addendum)
You need to be completed with evening meal 3 hours prior to laying down for bed. Stay on nexium twice daily (20-30 min before meals). Start pepcid or zantac 1 pill at bedtime every night. Call here in 4-5 weeks to report on your symptoms. Losing weight can help GERD as well.

## 2013-06-16 ENCOUNTER — Ambulatory Visit (HOSPITAL_COMMUNITY)
Admission: RE | Admit: 2013-06-16 | Discharge: 2013-06-16 | Disposition: A | Discharge status: Home / Self Care | Payer: Medicaid Other | Source: Ambulatory Visit | Attending: Family Medicine | Admitting: Family Medicine

## 2013-06-16 DIAGNOSIS — Z1231 Encounter for screening mammogram for malignant neoplasm of breast: Secondary | ICD-10-CM | POA: Insufficient documentation

## 2013-08-28 ENCOUNTER — Other Ambulatory Visit: Payer: Self-pay | Admitting: Gastroenterology

## 2013-08-29 ENCOUNTER — Telehealth: Payer: Self-pay | Admitting: *Deleted

## 2013-08-29 NOTE — Telephone Encounter (Signed)
Left  Message [name] LEFT  AT  FRONT DESK  FOR  PICK UP .Zack Seal/CY

## 2013-10-13 ENCOUNTER — Ambulatory Visit: Payer: Medicaid Other | Admitting: Nurse Practitioner

## 2013-10-17 ENCOUNTER — Ambulatory Visit: Payer: Medicaid Other | Attending: Neurosurgery | Admitting: Physical Therapy

## 2013-10-17 DIAGNOSIS — M2550 Pain in unspecified joint: Secondary | ICD-10-CM | POA: Insufficient documentation

## 2013-10-17 DIAGNOSIS — M546 Pain in thoracic spine: Secondary | ICD-10-CM | POA: Insufficient documentation

## 2013-10-17 DIAGNOSIS — R5381 Other malaise: Secondary | ICD-10-CM | POA: Insufficient documentation

## 2013-10-17 DIAGNOSIS — R293 Abnormal posture: Secondary | ICD-10-CM | POA: Insufficient documentation

## 2013-10-17 DIAGNOSIS — M542 Cervicalgia: Secondary | ICD-10-CM | POA: Insufficient documentation

## 2013-10-17 DIAGNOSIS — IMO0001 Reserved for inherently not codable concepts without codable children: Secondary | ICD-10-CM | POA: Insufficient documentation

## 2013-10-27 ENCOUNTER — Encounter: Payer: Self-pay | Admitting: Nurse Practitioner

## 2013-10-27 ENCOUNTER — Ambulatory Visit (INDEPENDENT_AMBULATORY_CARE_PROVIDER_SITE_OTHER): Payer: Medicaid Other | Admitting: Nurse Practitioner

## 2013-10-27 VITALS — BP 152/81 | HR 56 | Ht 66.0 in | Wt 200.0 lb

## 2013-10-27 DIAGNOSIS — G40909 Epilepsy, unspecified, not intractable, without status epilepticus: Secondary | ICD-10-CM

## 2013-10-27 MED ORDER — LEVETIRACETAM 500 MG PO TABS: 500.0000 mg | ORAL_TABLET | Freq: Two times a day (BID) | ORAL | Status: DC

## 2013-10-27 NOTE — Progress Notes (Signed)
I reviewed note and agree with plan.   Suanne MarkerVIKRAM R. Jasir Rother, MD 10/27/2013, 5:10 PM Certified in Neurology, Neurophysiology and Neuroimaging  [facility] [address], [address] [place], [place] [id number] [phone]

## 2013-10-27 NOTE — Patient Instructions (Signed)
Increase Keppra to 1 tablet in the morning and 2 tablets at night.  No driving due to seizure activity.  Follow up visit in 6 months.    Epilepsy Epilepsy is a disorder in which a person has repeated seizures over time. A seizure is a release of abnormal electrical activity in the brain. Seizures can cause a change in attention, behavior, or the ability to remain awake and alert (altered mental status). Seizures often involve uncontrollable shaking (convulsions).  Most people with epilepsy lead normal lives. However, people with epilepsy are at an increased risk of falls, accidents, and injuries. Therefore, it is important to begin treatment right away. CAUSES  Epilepsy has many possible causes. Anything that disturbs the normal pattern of brain cell activity can lead to seizures. This may include:   Head injury.  Birth trauma.  High fever as a child.  Stroke.  Bleeding into or around the brain.  Certain drugs.  Prolonged low oxygen, such as what occurs after CPR efforts.  Abnormal brain development.  Certain illnesses, such as meningitis, encephalitis (brain infection), malaria, and other infections.  An imbalance of nerve signaling chemicals (neurotransmitters).  SIGNS AND SYMPTOMS  The symptoms of a seizure can vary greatly from one person to another. Right before a seizure, you may have a warning (aura) that a seizure is about to occur. An aura may include the following symptoms:  Fear or anxiety.  Nausea.  Feeling like the room is spinning (vertigo).  Vision changes, such as seeing flashing lights or spots. Common symptoms during a seizure include:  Abnormal sensations, such as an abnormal smell or a bitter taste in the mouth.   Sudden, general body stiffness.   Convulsions that involve rhythmic jerking of the face, arm, or leg on one or both sides.   Sudden change in consciousness.   Appearing to be awake but not responding.   Appearing to be asleep  but cannot be awakened.   Grimacing, chewing, lip smacking, drooling, tongue biting, or loss of bowel or bladder control. After a seizure, you may feel sleepy for a while. DIAGNOSIS  Your health care provider will ask about your symptoms and take a medical history. Descriptions from any witnesses to your seizures will be very helpful in the diagnosis. A physical exam, including a detailed neurological exam, is necessary. Various tests may be done, such as:   An electroencephalogram (EEG). This is a painless test of your brain waves. In this test, a diagram is created of your brain waves. These diagrams can be interpreted by a specialist.  An MRI of the brain.   A CT scan of the brain.   A spinal tap (lumbar puncture, LP).  Blood tests to check for signs of infection or abnormal blood chemistry. TREATMENT  There is no cure for epilepsy, but it is generally treatable. Once epilepsy is diagnosed, it is important to begin treatment as soon as possible. For most people with epilepsy, seizures can be controlled with medicines. The following may also be used:  A pacemaker for the brain (vagus nerve stimulator) can be used for people with seizures that are not well controlled by medicine.  Surgery on the brain. For some people, epilepsy eventually goes away. HOME CARE INSTRUCTIONS   Follow your health care provider's recommendations on driving and safety in normal activities.  Get enough rest. Lack of sleep can cause seizures.  Only take over-the-counter or prescription medicines as directed by your health care provider. Take any prescribed medicine  exactly as directed.  Avoid any known triggers of your seizures.  Keep a seizure diary. Record what you recall about any seizure, especially any possible trigger.   Make sure the people you live and work with know that you are prone to seizures. They should receive instructions on how to help you. In general, a witness to a seizure should:    Cushion your head and body.   Turn you on your side.   Avoid unnecessarily restraining you.   Not place anything inside your mouth.   Call for emergency medical help if there is any question about what has occurred.   Follow up with your health care provider as directed. You may need regular blood tests to monitor the levels of your medicine.  SEEK MEDICAL CARE IF:   You develop signs of infection or other illness. This might increase the risk of a seizure.   You seem to be having more frequent seizures.   Your seizure pattern is changing.  SEEK IMMEDIATE MEDICAL CARE IF:   You have a seizure that does not stop after a few moments.   You have a seizure that causes any difficulty in breathing.   You have a seizure that results in a very severe headache.   You have a seizure that leaves you with the inability to speak or use a part of your body.  Document Released: 08/03/2005 Document Revised: 05/24/2013 Document Reviewed: 03/15/2013 [facility] Patient Information 2014 [place], [place].

## 2013-10-27 NOTE — Progress Notes (Signed)
PATIENT: [name] DOB: 02-Aug-1951  REASON FOR VISIT: follow up HISTORY FROM: patient  HISTORY OF PRESENT ILLNESS: PRIOR HPI 10/10/12 (VRP): [age] year old in female here for evaluation of seizure disorder. Patient had first seizure of life in the fifth grade. She was diagnosed with seizures and treated with phenobarbital. She had generalized convulsions, tongue biting and incontinence. She had seizures until the ninth grade and then to stop. At some point she was taken off phenobarbital because she no longer had seizures.  2012 patient began to have seizures again. She was having intermittent episodes of staring, being in a daze, followed by shaking, tongue biting, vomiting and incontinence. Patient had an episode last night as well.   04/12/13 (LL): Patient returns for followup visit since last visit on 10/10/2012. She states that she has not had any more seizures or syncopal episodes since her last visit. She continues to take Keppra 500 mg twice a day with no known side effects. She reports that in earlier in the year she was placed on Neurontin 600 mg 3 times a day by another doctor and felt very badly. She reports having visual hallucinations, nausea, excessive diaphoresis. She had several trips to the emergency room this year for heart rate and blood pressure issues, chest pain, nausea and vomiting, pyelonephritis, and abdominal pain. She has been evaluated by GI, and [facility]. She was taken off Neurontin which was replaced with Lyrica 50 mg 3 times a day by her pain management M.D. Dr. Kumar 2 months ago. Since this time she reports that she is doing much better. She has no complaints today.   UPDATE 10/27/13 (LL): Patient returns for followup, last visit was 04/12/13.  She reports that she has not been feeling so well in the past 6 months.  She unknowingly had not been getting refills on her Leviteracetam for 2 months and started having more seizures.  She then got a refill and  has been back on Leviteracetam for a month, but still reports 3 seizures during that time, mostly during the night, when she wakes up after having been incontinent of bladder and bowel and with sore muscles.  She has other times during the day when she feels that she is having them in which she feels confused and notices she has lost a few minutes, but these are unwitnessed.  She has been titrated up on her Lyrica to 100 mg TID, still has neuropopathy pain, but states she is very sleepy most of the time.  REVIEW OF SYSTEMS: Full 14 system review of systems performed and notable only for: Chills, fatigue, excessive sweating, neck pain, neck stiffness, ear pain, ringing in ears, double vision, loss of vision, eye pain, choking, chest tightness , palpitations, cold intolerance, excessive thirst, and abdominal pain, constipation, nausea, rectal pain, incontinence of bowels, snoring, sleep talking, sleep walking, acting out dreams, joint pain, back pain, aching muscles, muscle cramps, memory loss, dizziness, headache, numbness, seizure?, Passing out, confusion, depression, nervous/anxious, hallucinations   ALLERGIES: ,,Allergies  Allergen Reactions  . Amitriptyline Other (See Comments)    Disoriented.  . Hydromorphone Hcl Other (See Comments)    blood pressure drops.  . Prednisone     SENT INTO [facility]     HOME MEDICATIONS: Outpatient Prescriptions Prior to Visit  Medication Sig Dispense Refill  . albuterol (PROVENTIL HFA;VENTOLIN HFA) 108 (90 BASE) MCG/ACT inhaler Inhale 2 puffs into the lungs every 4 (four) hours as needed for wheezing or shortness of breath.      .Marland Kitchen  aspirin EC 81 MG tablet Take 81 mg by mouth daily.      Marland Kitchen atorvastatin (LIPITOR) 10 MG tablet Take 10 mg by mouth daily.      . budesonide-formoterol (SYMBICORT) 80-4.5 MCG/ACT inhaler Inhale 2 puffs into the lungs 2 (two) times daily.      . calcium carbonate (OS-CAL) 600 MG TABS Take 1 tablet (600 mg total) by mouth daily.      .  carvedilol (COREG) 12.5 MG tablet Take 12.5-18.75 mg by mouth 2 (two) times daily with a meal. Takes 1.5 tablets every morning and takes 1 tablets every evening.      . cholecalciferol (VITAMIN D) 1000 UNITS tablet Take 1,000 Units by mouth 2 (two) times daily.       . Cranberry 500 MG CAPS Take 500 mg by mouth 2 (two) times daily.      Marland Kitchen docusate sodium (COLACE) 100 MG capsule Take 100 mg by mouth 2 (two) times daily as needed for constipation. For stool softner      . dofetilide ([name]) 250 MCG capsule Take 1 capsule (250 mcg total) by mouth 2 (two) times daily.  180 capsule  4  . DULoxetine (CYMBALTA) 60 MG capsule Take 60 mg by mouth daily.      Marland Kitchen esomeprazole (NEXIUM) 40 MG capsule Take 40 mg by mouth 2 (two) times daily.      . ferrous sulfate 325 (65 FE) MG tablet Take 325 mg by mouth daily with breakfast.      . insulin glargine (LANTUS) 100 UNIT/ML injection Inject 40 Units into the skin at bedtime.      . Liraglutide (VICTOZA) 18 MG/3ML SOLN Inject 1.8 mg into the skin daily.      Marland Kitchen loratadine (CLARITIN) 10 MG tablet Take 10 mg by mouth every morning.      . metFORMIN (GLUCOPHAGE) 500 MG tablet Take 500 mg by mouth 2 (two) times daily with a meal.      . niacin (NIASPAN) 500 MG CR tablet Take 500 mg by mouth at bedtime.       . nitroGLYCERIN (NITROSTAT) 0.4 MG SL tablet Place 0.4 mg under the tongue every 5 (five) minutes as needed for chest pain.      Marland Kitchen ondansetron (ZOFRAN-ODT) 8 MG disintegrating tablet Take 8 mg by mouth every 6 (six) hours as needed for nausea.      Marland Kitchen oxyCODONE-acetaminophen (PERCOCET) 10-325 MG per tablet Take 1 tablet by mouth every 4 (four) hours as needed for pain.      . potassium chloride (K-DUR) 10 MEQ tablet Take 1 tablet (10 mEq total) by mouth daily.  90 tablet  3  . pregabalin (LYRICA) 25 MG capsule Take 25 mg by mouth 3 (three) times daily.       . promethazine (PHENERGAN) 25 MG tablet Take 1 tablet (25 mg total) by mouth every 6 (six) hours as needed  for nausea.  10 tablet  0  . rizatriptan (MAXALT) 10 MG tablet Take 10 mg by mouth daily as needed for migraine. For migraines. May repeat in 2 hours if needed.      . vitamin C (ASCORBIC ACID) 500 MG tablet Take 500 mg by mouth 2 (two) times daily.      Marland Kitchen levETIRAcetam (KEPPRA) 500 MG tablet Take 1 tablet (500 mg total) by mouth every 12 (twelve) hours.  60 tablet  5  . NEXIUM 40 MG capsule take 1 capsule by mouth twice a day before meals  60 capsule  2   No facility-administered medications prior to visit.     PHYSICAL EXAM  Filed Vitals:   10/27/13 0910  BP: 152/81  Pulse: 56  Height: 5\' 6"  (1.676 m)  Weight: 200 lb (90.719 kg)   Body mass index is 32.3 kg/(m^2).  Physical Exam  General:  in no acute distress. Well developed and groomed.  Neck: Neck is supple.  Cardiovascular: No carotid artery bruits. Heart is regular rate and rhythm with no murmurs.   Neurologic Exam  Mental Status: Patient is awake, but drowsy.  Language is fluent and comprehension intact.  Cranial Nerves: Pupils are equal and reactive to light. Visual fields are full to confrontation. Conjugate eye movements are full and symmetric. Facial sensation and strength are symmetric. Hearing is intact. Palate elevated symmetrically and uvula is midline. Shoulder shrug is symmetric. Tongue is midline.  Motor: Normal bulk and tone. Full strength in the upper and lower extremities. No pronator drift. TREMOR IN OUTSTRETCHED HANDS R>L. Sensory: DECR IN BLE IN STOCKING DISTRIBUTION.  Coordination: No ataxia or dysmetria on finger-nose or rapid alternating movement testing.  Gait and Station: ANTALGIC, UNSTEADY GAIT.  Reflexes: Deep tendon reflexes in the upper and lower extremity are TRACE and symmetric.  10/17/12 MRI BRAIN W/Wo  Normal MRI brain (with and without contrast).   ASSESSMENT AND PLAN 63 year old right-handed [place] American female with multiple medical problems seen in our office for SEIZURE DISORDER. She  claims that she was off of Leviteracetam for 2 months (? mix up with the pharmacy) and since she has been back on Leviteracetam for the last month she has had 3 known seizures, possibly with more partial seizures.  PLAN:  Increase Leviteracetam to 1 tablet in the morning and 2 tablets at night. No driving due to seizure activity. Follow up visit in 6 months, call for sooner appointment if seizures continue on increased dose.  Meds ordered this encounter  Medications  . levETIRAcetam (KEPPRA) 500 MG tablet    Sig: Take 1 tablet (500 mg total) by mouth every 12 (twelve) hours. Take 1 in the morning and 2 at night.    Dispense:  90 tablet    Refill:  5    Order Specific Question:  Supervising Provider    Answer:  Suanne Marker [[id number]]   Return in about 6 months (around 04/29/2014).  Ronal Fear, MSN, NP-C 10/27/2013, 10:00 AM [facility] [address], [address] [place], [place] [address] [phone]  Note: This document was prepared with digital dictation and possible smart phrase technology. Any transcriptional errors that result from this process are unintentional.

## 2013-12-20 ENCOUNTER — Telehealth: Payer: Self-pay

## 2013-12-20 NOTE — Telephone Encounter (Signed)
Prior Berkley Harveyauth has been sent for nexium pt is aware

## 2013-12-26 ENCOUNTER — Telehealth: Payer: Self-pay

## 2013-12-26 NOTE — Telephone Encounter (Signed)
Patient request refill on her tikosyn from the company I called but the rx has expired sent to [facility] to get a new application filled out

## 2013-12-28 ENCOUNTER — Telehealth: Payer: Self-pay | Admitting: *Deleted

## 2013-12-28 NOTE — Telephone Encounter (Signed)
[facility] [employer], will mail applic and get Dr Eden EmmsNishan to sign MD part next week when he is in office.

## 2014-01-04 NOTE — Telephone Encounter (Signed)
Faxed physician signed form to [employer] today for tikosyn, awaiting patients completed application and will fax that.

## 2014-01-06 ENCOUNTER — Emergency Department (HOSPITAL_COMMUNITY): Payer: Medicaid Other

## 2014-01-06 ENCOUNTER — Inpatient Hospital Stay (HOSPITAL_COMMUNITY)
Admission: EM | Admit: 2014-01-06 | Discharge: 2014-01-08 | DRG: 310 | Disposition: A | Discharge status: Home / Self Care | Payer: Medicaid Other | Attending: Internal Medicine | Admitting: Internal Medicine

## 2014-01-06 DIAGNOSIS — I10 Essential (primary) hypertension: Secondary | ICD-10-CM | POA: Diagnosis present

## 2014-01-06 DIAGNOSIS — R55 Syncope and collapse: Secondary | ICD-10-CM

## 2014-01-06 DIAGNOSIS — E785 Hyperlipidemia, unspecified: Secondary | ICD-10-CM

## 2014-01-06 DIAGNOSIS — Z87442 Personal history of urinary calculi: Secondary | ICD-10-CM

## 2014-01-06 DIAGNOSIS — Z800 Family history of malignant neoplasm of digestive organs: Secondary | ICD-10-CM

## 2014-01-06 DIAGNOSIS — Z860100 Personal history of colon polyps, unspecified: Secondary | ICD-10-CM

## 2014-01-06 DIAGNOSIS — J45909 Unspecified asthma, uncomplicated: Secondary | ICD-10-CM

## 2014-01-06 DIAGNOSIS — D869 Sarcoidosis, unspecified: Secondary | ICD-10-CM | POA: Diagnosis present

## 2014-01-06 DIAGNOSIS — Z794 Long term (current) use of insulin: Secondary | ICD-10-CM

## 2014-01-06 DIAGNOSIS — G609 Hereditary and idiopathic neuropathy, unspecified: Secondary | ICD-10-CM | POA: Diagnosis present

## 2014-01-06 DIAGNOSIS — Z5181 Encounter for therapeutic drug level monitoring: Secondary | ICD-10-CM

## 2014-01-06 DIAGNOSIS — I252 Old myocardial infarction: Secondary | ICD-10-CM

## 2014-01-06 DIAGNOSIS — K219 Gastro-esophageal reflux disease without esophagitis: Secondary | ICD-10-CM

## 2014-01-06 DIAGNOSIS — Z833 Family history of diabetes mellitus: Secondary | ICD-10-CM

## 2014-01-06 DIAGNOSIS — E119 Type 2 diabetes mellitus without complications: Secondary | ICD-10-CM | POA: Diagnosis present

## 2014-01-06 DIAGNOSIS — I4891 Unspecified atrial fibrillation: Principal | ICD-10-CM | POA: Diagnosis present

## 2014-01-06 DIAGNOSIS — R059 Cough, unspecified: Secondary | ICD-10-CM

## 2014-01-06 DIAGNOSIS — R05 Cough: Secondary | ICD-10-CM

## 2014-01-06 DIAGNOSIS — Z7901 Long term (current) use of anticoagulants: Secondary | ICD-10-CM

## 2014-01-06 DIAGNOSIS — G894 Chronic pain syndrome: Secondary | ICD-10-CM | POA: Diagnosis present

## 2014-01-06 DIAGNOSIS — Z8249 Family history of ischemic heart disease and other diseases of the circulatory system: Secondary | ICD-10-CM

## 2014-01-06 DIAGNOSIS — K5730 Diverticulosis of large intestine without perforation or abscess without bleeding: Secondary | ICD-10-CM | POA: Diagnosis present

## 2014-01-06 DIAGNOSIS — E876 Hypokalemia: Secondary | ICD-10-CM | POA: Diagnosis present

## 2014-01-06 DIAGNOSIS — R079 Chest pain, unspecified: Secondary | ICD-10-CM

## 2014-01-06 DIAGNOSIS — Z818 Family history of other mental and behavioral disorders: Secondary | ICD-10-CM

## 2014-01-06 DIAGNOSIS — Z79899 Other long term (current) drug therapy: Secondary | ICD-10-CM

## 2014-01-06 DIAGNOSIS — R072 Precordial pain: Secondary | ICD-10-CM

## 2014-01-06 DIAGNOSIS — F411 Generalized anxiety disorder: Secondary | ICD-10-CM | POA: Diagnosis present

## 2014-01-06 DIAGNOSIS — F3289 Other specified depressive episodes: Secondary | ICD-10-CM | POA: Diagnosis present

## 2014-01-06 DIAGNOSIS — Z888 Allergy status to other drugs, medicaments and biological substances status: Secondary | ICD-10-CM

## 2014-01-06 DIAGNOSIS — E669 Obesity, unspecified: Secondary | ICD-10-CM

## 2014-01-06 DIAGNOSIS — Z9089 Acquired absence of other organs: Secondary | ICD-10-CM

## 2014-01-06 DIAGNOSIS — I951 Orthostatic hypotension: Secondary | ICD-10-CM

## 2014-01-06 DIAGNOSIS — N200 Calculus of kidney: Secondary | ICD-10-CM

## 2014-01-06 DIAGNOSIS — J99 Respiratory disorders in diseases classified elsewhere: Secondary | ICD-10-CM | POA: Diagnosis present

## 2014-01-06 DIAGNOSIS — Z981 Arthrodesis status: Secondary | ICD-10-CM

## 2014-01-06 DIAGNOSIS — Z8041 Family history of malignant neoplasm of ovary: Secondary | ICD-10-CM

## 2014-01-06 DIAGNOSIS — K589 Irritable bowel syndrome without diarrhea: Secondary | ICD-10-CM | POA: Diagnosis present

## 2014-01-06 DIAGNOSIS — F329 Major depressive disorder, single episode, unspecified: Secondary | ICD-10-CM | POA: Diagnosis present

## 2014-01-06 HISTORY — DX: Anxiety disorder, unspecified: F41.9

## 2014-01-06 HISTORY — DX: Personal history of other diseases of the digestive system: Z87.19

## 2014-01-06 HISTORY — DX: Epilepsy, unspecified, not intractable, without status epilepticus: G40.909

## 2014-01-06 HISTORY — DX: Dorsalgia, unspecified: M54.9

## 2014-01-06 HISTORY — DX: Gastro-esophageal reflux disease without esophagitis: K21.9

## 2014-01-06 HISTORY — DX: Other chronic pain: G89.29

## 2014-01-06 HISTORY — DX: Type 2 diabetes mellitus without complications: E11.9

## 2014-01-06 LAB — CBC WITH DIFFERENTIAL/PLATELET
BASOS ABS: 0 10*3/uL (ref 0.0–0.1)
Basophils Relative: 1 % (ref 0–1)
EOS ABS: 0.1 10*3/uL (ref 0.0–0.7)
EOS PCT: 2 % (ref 0–5)
HCT: 36.5 % (ref 36.0–46.0)
Hemoglobin: 12.2 g/dL (ref 12.0–15.0)
Lymphocytes Relative: 46 % (ref 12–46)
Lymphs Abs: 1.5 10*3/uL (ref 0.7–4.0)
MCH: 30 pg (ref 26.0–34.0)
MCHC: 33.4 g/dL (ref 30.0–36.0)
MCV: 89.9 fL (ref 78.0–100.0)
Monocytes Absolute: 0.3 10*3/uL (ref 0.1–1.0)
Monocytes Relative: 10 % (ref 3–12)
Neutro Abs: 1.3 10*3/uL — ABNORMAL LOW (ref 1.7–7.7)
Neutrophils Relative %: 41 % — ABNORMAL LOW (ref 43–77)
PLATELETS: 199 10*3/uL (ref 150–400)
RBC: 4.06 MIL/uL (ref 3.87–5.11)
RDW: 13.9 % (ref 11.5–15.5)
WBC: 3.2 10*3/uL — ABNORMAL LOW (ref 4.0–10.5)

## 2014-01-06 LAB — BASIC METABOLIC PANEL
BUN: 16 mg/dL (ref 6–23)
CALCIUM: 9.4 mg/dL (ref 8.4–10.5)
CO2: 22 mEq/L (ref 19–32)
Chloride: 100 mEq/L (ref 96–112)
Creatinine, Ser: 1.15 mg/dL — ABNORMAL HIGH (ref 0.50–1.10)
GFR calc Af Amer: 58 mL/min — ABNORMAL LOW (ref >90)
GFR, EST NON AFRICAN AMERICAN: 50 mL/min — AB (ref >90)
GLUCOSE: 123 mg/dL — AB (ref 70–99)
Potassium: 3.2 mEq/L — ABNORMAL LOW (ref 3.7–5.3)
SODIUM: 140 meq/L (ref 137–147)

## 2014-01-06 LAB — PRO B NATRIURETIC PEPTIDE: Pro B Natriuretic peptide (BNP): 30.9 pg/mL (ref 0–125)

## 2014-01-06 LAB — I-STAT TROPONIN, ED: Troponin i, poc: 0 ng/mL (ref 0.00–0.08)

## 2014-01-06 MED ORDER — ASPIRIN 325 MG PO TABS
325.0000 mg | ORAL_TABLET | Freq: Once | ORAL | Status: AC
  Administered 2014-01-06: 325 mg via ORAL
  Filled 2014-01-06: qty 1

## 2014-01-06 MED ORDER — DILTIAZEM HCL 100 MG IV SOLR
5.0000 mg/h | INTRAVENOUS | Status: DC
  Administered 2014-01-06: 5 mg/h via INTRAVENOUS

## 2014-01-06 MED ORDER — MORPHINE SULFATE 4 MG/ML IJ SOLN
4.0000 mg | Freq: Once | INTRAMUSCULAR | Status: AC
  Administered 2014-01-06: 4 mg via INTRAVENOUS
  Filled 2014-01-06: qty 1

## 2014-01-06 MED ORDER — DIPHENHYDRAMINE HCL 50 MG/ML IJ SOLN
25.0000 mg | Freq: Once | INTRAMUSCULAR | Status: AC
  Administered 2014-01-06: 25 mg via INTRAVENOUS
  Filled 2014-01-06: qty 1

## 2014-01-06 MED ORDER — OXYCODONE-ACETAMINOPHEN 10-325 MG PO TABS: 1.0000 | ORAL_TABLET | ORAL | Status: DC | PRN

## 2014-01-06 MED ORDER — DILTIAZEM HCL 25 MG/5ML IV SOLN
15.0000 mg | Freq: Once | INTRAVENOUS | Status: AC
Start: 2014-01-06 — End: 2014-01-06
  Administered 2014-01-06: 15 mg via INTRAVENOUS
  Filled 2014-01-06: qty 5

## 2014-01-06 MED ORDER — POTASSIUM CHLORIDE CRYS ER 20 MEQ PO TBCR
40.0000 meq | EXTENDED_RELEASE_TABLET | Freq: Once | ORAL | Status: AC
  Administered 2014-01-06: 40 meq via ORAL
  Filled 2014-01-06: qty 2

## 2014-01-06 NOTE — ED Provider Notes (Signed)
CSN: [id number]     Arrival date & time 01/06/14  1927 History   First MD Initiated Contact with Patient 01/06/14 1931     No chief complaint on file.    (Consider location/radiation/quality/duration/timing/severity/associated sxs/prior Treatment) HPI Comments: 63 year old female with a past medical history of paroxysmal atrial fibrillation, diabetes, hypertension, MI, depression, asthma, sarcoidosis, seizures, chronic headaches and fibromyalgia presents to the emergency department complaining of rapid heartbeat and chest pain x4 days. States she called her cardiologist and PCP to make an appointment and was advised to come to the emergency department if her heart rate was over 90. Chest pain located on the left side of her chest, radiating down her left arm, worse with movement. She took sublingual nitroglycerin yesterday with no relief. She has not yet taken her aspirin today. Admits to associated shortness of breath, lightheadedness, dizziness, weakness, nausea and vomiting. A. fib controlled with both carvedilol and dofetilide. Cardiologist Dr. [name].  The history is provided by the patient.    Past Medical History  Diagnosis Date  . Diabetes mellitus     type 2  . Hypertension   . Hyperlipidemia   . LVH (left ventricular hypertrophy)   . Arthritis   . Colon polyps   . Diverticulosis   . Gallstones   . IBS (irritable bowel syndrome)   . Kidney stones   . Myocardial infarction   . Anginal pain     OCT 2013  . Paroxysmal a-fib     coumadin ( Chads2=2) Echo 09/05/10 EF 55-60%; mod LVH  . Depression   . 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 Sarcoidosis of lung     NONACTIVE      SEES DR LKJZ  . Seizures     YEARS AGO NONE SINCE TEENS  . Chronic headaches   . Fibromyalgia   . Peripheral neuropathy    Past Surgical History  Procedure Laterality Date  . Left ankle fracture      closed reduction March 2008  . Wrist arthroscopy      June 2003  . Carpal tunnel release  2003    bilateral   . Subcutaneous transposition of left ulnar nerve    . Abdominal hysterectomy    . Cholecystectomy    . Elbow surgery      bilateral  . Esophageal manometry  03/21/2012    Procedure: ESOPHAGEAL MANOMETRY (EM);  Surgeon: [name], MD;  Location: [facility];  Service: Endoscopy;  Laterality: N/A;  . Colon surgery    . Anterior cervical decomp/discectomy fusion  07/11/2012    Procedure: ANTERIOR CERVICAL DECOMPRESSION/DISCECTOMY FUSION 1 LEVEL;  Surgeon: [name], MD;  Location: [facility];  Service: Neurosurgery;  Laterality: N/A;  Cervical Five-Six Anterior Cervical Decompression with Fusion Interbody Prothesis Plating and Bonegraft   Family History  Problem Relation Age of Onset  . Heart attack Mother     @ age 34  . Mental illness Mother   . Diabetes Mother   . Hypertension Mother     siblings  . Heart attack Brother 50  . Colon cancer Maternal Aunt   . Prostate cancer Maternal Grandfather   . Ovarian cancer Maternal Aunt   . Diabetes    . Lupus Sister   . Ovarian cancer Cousin   . Hypertension     History  Substance Use Topics  . Smoking status: Never Smoker   . Smokeless tobacco: Never Used  . Alcohol Use: No  OB History   Grav Para Term Preterm Abortions TAB SAB Ect Mult Living                 Review of Systems  Respiratory: Positive for shortness of breath.   Cardiovascular: Positive for chest pain and palpitations.  Gastrointestinal: Positive for nausea and vomiting.  Neurological: Positive for weakness.  All other systems reviewed and are negative.     Allergies  Amitriptyline; Hydromorphone hcl; and Prednisone  Home Medications   Prior to Admission medications   Medication Sig Start Date End Date Taking? Authorizing Provider  albuterol (PROVENTIL HFA;VENTOLIN HFA) 108 (90 BASE) MCG/ACT inhaler Inhale 2 puffs into the lungs every 4 (four) hours as needed for wheezing or shortness of breath. 10/19/11  Yes Tammy S Parrett, NP  aspirin EC  81 MG tablet Take 81 mg by mouth daily.   Yes Historical Provider, MD  atorvastatin (LIPITOR) 10 MG tablet Take 10 mg by mouth daily.   Yes Historical Provider, MD  budesonide-formoterol (SYMBICORT) 80-4.5 MCG/ACT inhaler Inhale 2 puffs into the lungs 2 (two) times daily.   Yes Historical Provider, MD  calcium carbonate (OS-CAL) 600 MG TABS Take 1 tablet (600 mg total) by mouth daily. 10/19/11  Yes Tammy S Parrett, NP  carvedilol (COREG) 12.5 MG tablet Take 12.5-18.75 mg by mouth 2 (two) times daily with a meal. Takes 1.5 tablets every morning and takes 1 tablets every evening.   Yes Historical Provider, MD  cholecalciferol (VITAMIN D) 1000 UNITS tablet Take 1,000 Units by mouth 2 (two) times daily.    Yes Historical Provider, MD  Cranberry 500 MG CAPS Take 500 mg by mouth 2 (two) times daily.   Yes Historical Provider, MD  docusate sodium (COLACE) 100 MG capsule Take 100 mg by mouth 2 (two) times daily as needed for constipation. For stool softner   Yes Historical Provider, MD  dofetilide ([name]) 250 MCG capsule Take 1 capsule (250 mcg total) by mouth 2 (two) times daily. 11/11/12  Yes Wendall Stade, MD  doxazosin (CARDURA) 4 MG tablet Take 4 mg by mouth daily.   Yes Historical Provider, MD  DULoxetine (CYMBALTA) 60 MG capsule Take 60 mg by mouth daily.   Yes Historical Provider, MD  esomeprazole (NEXIUM) 40 MG capsule Take 40 mg by mouth 2 (two) times daily.   Yes Historical Provider, MD  ferrous sulfate 325 (65 FE) MG tablet Take 325 mg by mouth daily with breakfast.   Yes Historical Provider, MD  insulin glargine (LANTUS) 100 UNIT/ML injection Inject 40 Units into the skin at bedtime. 10/19/11  Yes Tammy S Parrett, NP  levETIRAcetam (KEPPRA) 500 MG tablet Take 1 tablet (500 mg total) by mouth every 12 (twelve) hours. Take 1 in the morning and 2 at night. 10/27/13  Yes Ronal Fear, NP  Liraglutide (VICTOZA) 18 MG/3ML SOLN Inject 1.8 mg into the skin daily.   Yes Historical Provider, MD  loratadine  (CLARITIN) 10 MG tablet Take 10 mg by mouth every morning.   Yes Historical Provider, MD  metFORMIN (GLUCOPHAGE) 500 MG tablet Take 500 mg by mouth 2 (two) times daily with a meal.   Yes Historical Provider, MD  niacin (NIASPAN) 500 MG CR tablet Take 500 mg by mouth at bedtime.    Yes Historical Provider, MD  nitroGLYCERIN (NITROSTAT) 0.4 MG SL tablet Place 0.4 mg under the tongue every 5 (five) minutes as needed for chest pain.   Yes Historical Provider, MD  ondansetron (ZOFRAN-ODT) 8  MG disintegrating tablet Take 8 mg by mouth every 6 (six) hours as needed for nausea.   Yes Historical Provider, MD  oxyCODONE-acetaminophen (PERCOCET) 10-325 MG per tablet Take 1 tablet by mouth every 4 (four) hours as needed for pain.   Yes Historical Provider, MD  potassium chloride (K-DUR) 10 MEQ tablet Take 1 tablet (10 mEq total) by mouth daily. 03/03/13  Yes Wendall StadePeter C Nishan, MD  pregabalin (LYRICA) 25 MG capsule Take 25 mg by mouth 3 (three) times daily.    Yes Historical Provider, MD  promethazine (PHENERGAN) 25 MG tablet Take 1 tablet (25 mg total) by mouth every 6 (six) hours as needed for nausea. 11/27/12  Yes Jamesetta Orleanshristopher W Lawyer, PA-C  rizatriptan (MAXALT) 10 MG tablet Take 10 mg by mouth daily as needed for migraine. For migraines. May repeat in 2 hours if needed.   Yes Historical Provider, MD  vitamin C (ASCORBIC ACID) 500 MG tablet Take 500 mg by mouth 2 (two) times daily.   Yes Historical Provider, MD   BP 137/89  Pulse 114  Temp(Src) 97.8 F (36.6 C) (Oral)  Resp 18  SpO2 96% Physical Exam  Nursing note and vitals reviewed. Constitutional: She is oriented to person, place, and time. She appears well-developed and well-nourished. No distress.  HENT:  Head: Normocephalic and atraumatic.  Mouth/Throat: Oropharynx is clear and moist.  Eyes: Conjunctivae and EOM are normal. Pupils are equal, round, and reactive to light.  Neck: Normal range of motion. Neck supple. No JVD present.  Cardiovascular:  Normal heart sounds and intact distal pulses.  An irregularly irregular rhythm present. Tachycardia present.   No extremity edema.  Pulmonary/Chest: Effort normal and breath sounds normal. No respiratory distress.  Abdominal: Soft. Bowel sounds are normal. There is no tenderness.  Musculoskeletal: Normal range of motion. She exhibits no edema.  Neurological: She is alert and oriented to person, place, and time. She has normal strength. No sensory deficit.  Speech fluent, goal oriented. Moves limbs without ataxia. Equal grip strength bilateral.  Skin: Skin is warm and dry. She is not diaphoretic.  Psychiatric: She has a normal mood and affect. Her behavior is normal.    ED Course  Procedures (including critical care time) CRITICAL CARE Performed by: Trevor Maceobyn M Albert   Total critical care time: 45 minutes  Critical care time was exclusive of separately billable procedures and treating other patients.  Critical care was necessary to treat or prevent imminent or life-threatening deterioration.  Critical care was time spent personally by me on the following activities: development of treatment plan with patient and/or surrogate as well as nursing, discussions with consultants, evaluation of patient's response to treatment, examination of patient, obtaining history from patient or surrogate, ordering and performing treatments and interventions, ordering and review of laboratory studies, ordering and review of radiographic studies, pulse oximetry and re-evaluation of patient's condition.  Labs Review Labs Reviewed  CBC WITH DIFFERENTIAL - Abnormal; Notable for the following:    WBC 3.2 (*)    Neutrophils Relative % 41 (*)    Neutro Abs 1.3 (*)    All other components within normal limits  BASIC METABOLIC PANEL - Abnormal; Notable for the following:    Potassium 3.2 (*)    Glucose, Bld 123 (*)    Creatinine, Ser 1.15 (*)    GFR calc non Af Amer 50 (*)    GFR calc Af Amer 58 (*)    All  other components within normal limits  PRO B NATRIURETIC  PEPTIDE  Rosezena Sensor, ED    Imaging Review Dg Chest Port 1 View  01/06/2014   CLINICAL DATA:  Left-sided chest pain  EXAM: PORTABLE CHEST - 1 VIEW  COMPARISON:  01/21/2013  FINDINGS: The heart size and mediastinal contours are within normal limits. Both lungs are clear. The visualized skeletal structures are unremarkable.  IMPRESSION: No active disease.   Electronically Signed   By: Amie Portland M.D.   On: 01/06/2014 20:20     EKG Interpretation   Date/Time:  Saturday Jan 06 2014 19:34:22 EDT Ventricular Rate:  131 PR Interval:    QRS Duration: 80 QT Interval:  331 QTC Calculation: 489 R Axis:   -31 Text Interpretation:  Atrial fibrillation Ventricular premature complex  Left axis deviation Abnormal R-wave progression, early transition  Borderline T abnormalities, anterior leads Prior EKG with NSR Confirmed by  DOCHERTY  MD, MEGAN ([id number]) on 01/06/2014 7:44:05 PM      MDM   Final diagnoses:  Atrial fibrillation    Patient presenting in rapid atrial fibrillation. She is well appearing and in no apparent distress. Afebrile. Tachycardic, vitals otherwise stable. Labs pending. Heart rate between 120 and 140. Plan to give IV Cardizem. Will consult cardiology.  Case discussed with attending Dr. Micheline Maze who agrees with plan of care.  10:00 PM Potassium 3.2. Will replace orally. Troponin negative. Chest x-ray clear. I spoke with cardiology fellow on call who will evaluate patient. 11:07 PM Pt evaluated by cardiology, will be admitted for observation.  Trevor Mace, PA-C 01/06/14 [id number]  Trevor Mace, PA-C 01/06/14 [phone]

## 2014-01-06 NOTE — H&P (Signed)
History and Physical  Patient ID: [name] MRN: [id number], DOB: 20-Dec-1950 Date of Encounter: 01/06/2014, 10:34 PM Primary Physician: Geraldo Pitter, MD Primary Cardiologist: Dr [name] / Lawson Fiscal gerhardt   Chief Complaint: Afib  Reason for Admission: Afib   63 yr old female with hx of HTN , DM , HLD, PAF ( on tikosyn and coreg ),  Sarcoid here  With Afib with RVR    HPI: pt states that for the past 3 days she has felt the constellation of symptoms of palpitations with associated chest discomfort, dizziness and nausea. This is typical of her Afib symptoms. In general per pt this has been well controlled with no prior such symptoms for more than a year.  Per pt anticoagulation was discontinued 2 years ago for reason unclear to her by her cardiologist  She reports medication compliance.  Pt denies any  SOB , orthopnea, PND , LE edema , Syncope ,claudcation , focal weakness, or bleeding diathesis .    Past Medical History  Diagnosis Date  . Diabetes mellitus     type 2  . Hypertension   . Hyperlipidemia   . LVH (left ventricular hypertrophy)   . Arthritis   . Colon polyps   . Diverticulosis   . Gallstones   . IBS (irritable bowel syndrome)   . Kidney stones   . Myocardial infarction   . Anginal pain     OCT 2013  . Paroxysmal a-fib     coumadin ( Chads2=2) Echo 09/05/10 EF 55-60%; mod LVH  . Depression   . 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 Sarcoidosis of lung     NONACTIVE      SEES DR KTGY  . Seizures     YEARS AGO NONE SINCE TEENS  . Chronic headaches   . Fibromyalgia   . Peripheral neuropathy      Most Recent Cardiac Studies: EKG 01/06/2014 Afib/ flutter with RVR, borderline LAD , QTc 489 msec   Echo 09/2007 - Left ventricular size was normal. - Overall left ventricular systolic function was normal. - Left ventricular ejection fraction was estimated , range being 60 % to 65 %. - There were no left ventricular regional wall motion abnormalities. - Left ventricular  wall thickness was normal.   Per notes normal coronaries per coronary angiography in 2012   Surgical History:  Past Surgical History  Procedure Laterality Date  . Left ankle fracture      closed reduction March 2008  . Wrist arthroscopy      June 2003  . Carpal tunnel release  2003    bilateral  . Subcutaneous transposition of left ulnar nerve    . Abdominal hysterectomy    . Cholecystectomy    . Elbow surgery      bilateral  . Esophageal manometry  03/21/2012    Procedure: ESOPHAGEAL MANOMETRY (EM);  Surgeon: [name], MD;  Location: [facility];  Service: Endoscopy;  Laterality: N/A;  . Colon surgery    . Anterior cervical decomp/discectomy fusion  07/11/2012    Procedure: ANTERIOR CERVICAL DECOMPRESSION/DISCECTOMY FUSION 1 LEVEL;  Surgeon: [name], MD;  Location: [facility];  Service: Neurosurgery;  Laterality: N/A;  Cervical Five-Six Anterior Cervical Decompression with Fusion Interbody Prothesis Plating and Bonegraft     Home Meds: Prior to Admission medications   Medication Sig Start Date End Date Taking? Authorizing Provider  albuterol (PROVENTIL HFA;VENTOLIN HFA) 108 (90 BASE) MCG/ACT inhaler Inhale 2 puffs  into the lungs every 4 (four) hours as needed for wheezing or shortness of breath. 10/19/11  Yes Tammy S Parrett, NP  aspirin EC 81 MG tablet Take 81 mg by mouth daily.   Yes Historical Provider, MD  atorvastatin (LIPITOR) 10 MG tablet Take 10 mg by mouth daily.   Yes Historical Provider, MD  budesonide-formoterol (SYMBICORT) 80-4.5 MCG/ACT inhaler Inhale 2 puffs into the lungs 2 (two) times daily.   Yes Historical Provider, MD  calcium carbonate (OS-CAL) 600 MG TABS Take 1 tablet (600 mg total) by mouth daily. 10/19/11  Yes Tammy S Parrett, NP  carvedilol (COREG) 12.5 MG tablet Take 12.5-18.75 mg by mouth 2 (two) times daily with a meal. Takes 1.5 tablets every morning and takes 1 tablets every evening.   Yes Historical Provider, MD  cholecalciferol  (VITAMIN D) 1000 UNITS tablet Take 1,000 Units by mouth 2 (two) times daily.    Yes Historical Provider, MD  Cranberry 500 MG CAPS Take 500 mg by mouth 2 (two) times daily.   Yes Historical Provider, MD  docusate sodium (COLACE) 100 MG capsule Take 100 mg by mouth 2 (two) times daily as needed for constipation. For stool softner   Yes Historical Provider, MD  dofetilide ([name]) 250 MCG capsule Take 1 capsule (250 mcg total) by mouth 2 (two) times daily. 11/11/12  Yes Wendall Stade, MD  doxazosin (CARDURA) 4 MG tablet Take 4 mg by mouth daily.   Yes Historical Provider, MD  DULoxetine (CYMBALTA) 60 MG capsule Take 60 mg by mouth daily.   Yes Historical Provider, MD  esomeprazole (NEXIUM) 40 MG capsule Take 40 mg by mouth 2 (two) times daily.   Yes Historical Provider, MD  ferrous sulfate 325 (65 FE) MG tablet Take 325 mg by mouth daily with breakfast.   Yes Historical Provider, MD  insulin glargine (LANTUS) 100 UNIT/ML injection Inject 40 Units into the skin at bedtime. 10/19/11  Yes Tammy S Parrett, NP  levETIRAcetam (KEPPRA) 500 MG tablet Take 1 tablet (500 mg total) by mouth every 12 (twelve) hours. Take 1 in the morning and 2 at night. 10/27/13  Yes Ronal Fear, NP  Liraglutide (VICTOZA) 18 MG/3ML SOLN Inject 1.8 mg into the skin daily.   Yes Historical Provider, MD  loratadine (CLARITIN) 10 MG tablet Take 10 mg by mouth every morning.   Yes Historical Provider, MD  metFORMIN (GLUCOPHAGE) 500 MG tablet Take 500 mg by mouth 2 (two) times daily with a meal.   Yes Historical Provider, MD  niacin (NIASPAN) 500 MG CR tablet Take 500 mg by mouth at bedtime.    Yes Historical Provider, MD  nitroGLYCERIN (NITROSTAT) 0.4 MG SL tablet Place 0.4 mg under the tongue every 5 (five) minutes as needed for chest pain.   Yes Historical Provider, MD  ondansetron (ZOFRAN-ODT) 8 MG disintegrating tablet Take 8 mg by mouth every 6 (six) hours as needed for nausea.   Yes Historical Provider, MD  oxyCODONE-acetaminophen  (PERCOCET) 10-325 MG per tablet Take 1 tablet by mouth every 4 (four) hours as needed for pain.   Yes Historical Provider, MD  potassium chloride (K-DUR) 10 MEQ tablet Take 1 tablet (10 mEq total) by mouth daily. 03/03/13  Yes Wendall Stade, MD  pregabalin (LYRICA) 25 MG capsule Take 25 mg by mouth 3 (three) times daily.    Yes Historical Provider, MD  promethazine (PHENERGAN) 25 MG tablet Take 1 tablet (25 mg total) by mouth every 6 (six) hours as needed  for nausea. 11/27/12  Yes Jamesetta Orleans Lawyer, PA-C  rizatriptan (MAXALT) 10 MG tablet Take 10 mg by mouth daily as needed for migraine. For migraines. May repeat in 2 hours if needed.   Yes Historical Provider, MD  vitamin C (ASCORBIC ACID) 500 MG tablet Take 500 mg by mouth 2 (two) times daily.   Yes Historical Provider, MD    Allergies:  Allergies  Allergen Reactions  . Amitriptyline Other (See Comments)    Disoriented.  . Hydromorphone Hcl Other (See Comments)    blood pressure drops.  . Prednisone     SENT INTO AFIB     History   Social History  . Marital Status: Divorced    Spouse Name: N/A    Number of Children: 3  . Years of Education: N/A   Occupational History  . disabled     CNA   Social History Main Topics  . Smoking status: Never Smoker   . Smokeless tobacco: Never Used  . Alcohol Use: No  . Drug Use: No  . Sexual Activity: Not on file   Other Topics Concern  . Not on file   Social History Narrative   Pt. Lives at home with her daughter. She is single, has three children, does not work currently, and has a 12th grade education level. She has never used tobacco or alcohol. Quit using illicit drugs at the age of 48 and rarely ha caffeine.     Family History  Problem Relation Age of Onset  . Heart attack Mother     @ age 67  . Mental illness Mother   . Diabetes Mother   . Hypertension Mother     siblings  . Heart attack Brother 50  . Colon cancer Maternal Aunt   . Prostate cancer Maternal Grandfather    . Ovarian cancer Maternal Aunt   . Diabetes    . Lupus Sister   . Ovarian cancer Cousin   . Hypertension      Review of Systems:General: negative for chills, fever, night sweats or weight changes.  Cardiovascular: see HPI  Dermatological: negative for rash Respiratory: negative for cough or wheezing Urologic: negative for hematuria Abdominal: negative for nausea, vomiting, diarrhea, bright red blood per rectum, melena, or hematemesis Neurologic: negative for visual changes, syncope, or dizziness All other systems reviewed and are otherwise negative except as noted above.  Labs:   Lab Results  Component Value Date   WBC 3.2* 01/06/2014   HGB 12.2 01/06/2014   HCT 36.5 01/06/2014   MCV 89.9 01/06/2014   PLT 199 01/06/2014    Recent Labs Lab 01/06/14 2020  NA 140  K 3.2*  CL 100  CO2 22  BUN 16  CREATININE 1.15*  CALCIUM 9.4  GLUCOSE 123*   No results found for this basename: CKTOTAL, CKMB, TROPONINI,  in the last 72 hours Lab Results  Component Value Date   CHOL  Value: 247        ATP III CLASSIFICATION:  <200     mg/dL   Desirable  161-096  mg/dL   Borderline High  >=045    mg/dL   High       * 11/23/8117   HDL 50 09/08/2010   LDLCALC  Value: UNABLE TO CALCULATE IF TRIGLYCERIDE OVER 400 mg/dL        Total Cholesterol/HDL:CHD Risk Coronary Heart Disease Risk Table  Men   Women  1/2 Average Risk   3.4   3.3  Average Risk       5.0   4.4  2 X Average Risk   9.6   7.1  3 X Average Risk  23.4   11.0        Use the calculated Patient Ratio above and the CHD Risk Table to determine the patient's CHD Risk.        ATP III CLASSIFICATION (LDL):  <100     mg/dL   Optimal  161-096100-129  mg/dL   Near or Above                    Optimal  130-159  mg/dL   Borderline  045-409160-189  mg/dL   High  >811>190     mg/dL   Very High 9/14/78291/23/2012   TRIG 427* 09/08/2010   Lab Results  Component Value Date   DDIMER  Value: 0.30        AT THE INHOUSE ESTABLISHED CUTOFF VALUE OF 0.48 ug/mL FEU, THIS  ASSAY HAS BEEN DOCUMENTED IN THE LITERATURE TO HAVE A SENSITIVITY AND NEGATIVE PREDICTIVE VALUE OF AT LEAST 98 TO 99%.  THE TEST RESULT SHOULD BE CORRELATED WITH AN ASSESSMENT OF THE CLINICAL PROBABILITY OF DVT / VTE. 01/15/2011    Radiology/Studies:  Dg Chest Port 1 View  01/06/2014   CLINICAL DATA:  Left-sided chest pain  EXAM: PORTABLE CHEST - 1 VIEW  COMPARISON:  01/21/2013  FINDINGS: The heart size and mediastinal contours are within normal limits. Both lungs are clear. The visualized skeletal structures are unremarkable.  IMPRESSION: No active disease.   Electronically Signed   By: Amie Portlandavid  Ormond M.D.   On: 01/06/2014 20:20       Physical Exam: Blood pressure 124/72, pulse 78, temperature 97.8 F (36.6 C), temperature source Oral, resp. rate 11, SpO2 96.00%. General: Well developed, well nourished, in no acute distress. Head: Normocephalic, atraumatic, sclera non-icteric, no xanthomas, nares are without discharge.  Neck: Negative for carotid bruits. JVD not elevated. Lungs: Clear bilaterally to auscultation without wheezes, rales, or rhonchi. Breathing is unlabored. Heart: irregular .no murmurs, rubs, or gallops appreciated. Abdomen: Soft, non-tender, non-distended with normoactive bowel sounds. No hepatomegaly. No rebound/guarding. No obvious abdominal masses. Msk:  Strength and tone appear normal for age. Extremities: No clubbing or cyanosis. No edema.  Distal pedal pulses are 2+ and equal bilaterally. Neuro: Alert and oriented X 3. No focal deficit. No facial asymmetry. Moves all extremities spontaneously. Psych:  Responds to questions appropriately with a normal affect.    ASSESSMENT AND PLAN:  Afib with RVR,  CHADS2 vasc score of 3   Hypokalemia  DM type II   -Unclear why anticoagulation was d/c in the past. Will need to discuss with Dr Eden EmmsNishan . She would be a candidate for TEE/cardioversion If the pt has no contra-indication to anticoagulation and is amenable to this  -Cont  coreg , tikosyn and dilt gtt for now  -eplete K  -old metformin , cont lantus and start SSI in anticipation of NPO status for possible cardioversion in am    Signed, Crissie SicklesMian A Kristopher Attwood M.D   01/06/2014, 10:34 PM

## 2014-01-06 NOTE — ED Notes (Signed)
Cp/tachycardia since wed. Called pcp to make an appt. Told if hr. >91 was to come in hospital. Left sided cp. Took ntg. Sl. 0.4 mg yesterday with no relief.

## 2014-01-07 DIAGNOSIS — R079 Chest pain, unspecified: Secondary | ICD-10-CM

## 2014-01-07 DIAGNOSIS — I517 Cardiomegaly: Secondary | ICD-10-CM

## 2014-01-07 DIAGNOSIS — I4891 Unspecified atrial fibrillation: Principal | ICD-10-CM

## 2014-01-07 LAB — CBC
HEMATOCRIT: 36.9 % (ref 36.0–46.0)
Hemoglobin: 12.2 g/dL (ref 12.0–15.0)
MCH: 30 pg (ref 26.0–34.0)
MCHC: 33.1 g/dL (ref 30.0–36.0)
MCV: 90.7 fL (ref 78.0–100.0)
Platelets: 204 10*3/uL (ref 150–400)
RBC: 4.07 MIL/uL (ref 3.87–5.11)
RDW: 14 % (ref 11.5–15.5)
WBC: 3.9 10*3/uL — ABNORMAL LOW (ref 4.0–10.5)

## 2014-01-07 LAB — GLUCOSE, CAPILLARY
Glucose-Capillary: 102 mg/dL — ABNORMAL HIGH (ref 70–99)
Glucose-Capillary: 138 mg/dL — ABNORMAL HIGH (ref 70–99)
Glucose-Capillary: 146 mg/dL — ABNORMAL HIGH (ref 70–99)
Glucose-Capillary: 152 mg/dL — ABNORMAL HIGH (ref 70–99)
Glucose-Capillary: 214 mg/dL — ABNORMAL HIGH (ref 70–99)

## 2014-01-07 LAB — BASIC METABOLIC PANEL
BUN: 17 mg/dL (ref 6–23)
CHLORIDE: 101 meq/L (ref 96–112)
CO2: 22 mEq/L (ref 19–32)
CREATININE: 1.2 mg/dL — AB (ref 0.50–1.10)
Calcium: 9.2 mg/dL (ref 8.4–10.5)
GFR, EST AFRICAN AMERICAN: 55 mL/min — AB (ref >90)
GFR, EST NON AFRICAN AMERICAN: 48 mL/min — AB (ref >90)
Glucose, Bld: 113 mg/dL — ABNORMAL HIGH (ref 70–99)
Potassium: 3.6 mEq/L — ABNORMAL LOW (ref 3.7–5.3)
Sodium: 138 mEq/L (ref 137–147)

## 2014-01-07 MED ORDER — RIVAROXABAN 20 MG PO TABS
20.0000 mg | ORAL_TABLET | Freq: Every day | ORAL | Status: DC
  Administered 2014-01-07: 20 mg via ORAL
  Filled 2014-01-07 (×2): qty 1

## 2014-01-07 MED ORDER — LEVETIRACETAM 500 MG PO TABS
500.0000 mg | ORAL_TABLET | Freq: Two times a day (BID) | ORAL | Status: DC
  Administered 2014-01-07 – 2014-01-08 (×4): 500 mg via ORAL
  Filled 2014-01-07 (×5): qty 1

## 2014-01-07 MED ORDER — VITAMIN C 500 MG PO TABS
500.0000 mg | ORAL_TABLET | Freq: Two times a day (BID) | ORAL | Status: DC
  Administered 2014-01-07 – 2014-01-08 (×3): 500 mg via ORAL
  Filled 2014-01-07 (×4): qty 1

## 2014-01-07 MED ORDER — DOCUSATE SODIUM 100 MG PO CAPS
100.0000 mg | ORAL_CAPSULE | Freq: Two times a day (BID) | ORAL | Status: DC | PRN
  Filled 2014-01-07: qty 1

## 2014-01-07 MED ORDER — CALCIUM CARBONATE 1250 (500 CA) MG PO TABS
1.0000 | ORAL_TABLET | Freq: Every day | ORAL | Status: DC
  Administered 2014-01-07: 500 mg via ORAL
  Filled 2014-01-07: qty 1

## 2014-01-07 MED ORDER — CALCIUM CARBONATE 600 MG PO TABS
600.0000 mg | ORAL_TABLET | Freq: Every day | ORAL | Status: DC
  Filled 2014-01-07: qty 1

## 2014-01-07 MED ORDER — DOXAZOSIN MESYLATE 4 MG PO TABS
4.0000 mg | ORAL_TABLET | Freq: Every day | ORAL | Status: DC
  Administered 2014-01-07 – 2014-01-08 (×2): 4 mg via ORAL
  Filled 2014-01-07 (×2): qty 1

## 2014-01-07 MED ORDER — SUMATRIPTAN SUCCINATE 50 MG PO TABS: 50.0000 mg | ORAL_TABLET | Freq: Four times a day (QID) | ORAL | Status: DC | PRN

## 2014-01-07 MED ORDER — POTASSIUM CHLORIDE ER 10 MEQ PO TBCR
10.0000 meq | EXTENDED_RELEASE_TABLET | Freq: Every day | ORAL | Status: DC
  Administered 2014-01-07 – 2014-01-08 (×2): 10 meq via ORAL
  Filled 2014-01-07 (×2): qty 1

## 2014-01-07 MED ORDER — DOFETILIDE 250 MCG PO CAPS
250.0000 ug | ORAL_CAPSULE | Freq: Two times a day (BID) | ORAL | Status: DC
  Administered 2014-01-07 – 2014-01-08 (×4): 250 ug via ORAL
  Filled 2014-01-07 (×5): qty 1

## 2014-01-07 MED ORDER — NIACIN ER (ANTIHYPERLIPIDEMIC) 500 MG PO TBCR
500.0000 mg | EXTENDED_RELEASE_TABLET | Freq: Every day | ORAL | Status: DC
  Administered 2014-01-07 (×2): 500 mg via ORAL
  Filled 2014-01-07 (×3): qty 1

## 2014-01-07 MED ORDER — PREGABALIN 25 MG PO CAPS
25.0000 mg | ORAL_CAPSULE | Freq: Three times a day (TID) | ORAL | Status: DC
  Administered 2014-01-07 – 2014-01-08 (×4): 25 mg via ORAL
  Filled 2014-01-07 (×4): qty 1

## 2014-01-07 MED ORDER — INSULIN GLARGINE 100 UNIT/ML ~~LOC~~ SOLN
30.0000 [IU] | Freq: Every day | SUBCUTANEOUS | Status: DC
  Administered 2014-01-07 (×2): 30 [IU] via SUBCUTANEOUS
  Filled 2014-01-07 (×4): qty 0.3

## 2014-01-07 MED ORDER — PROMETHAZINE HCL 25 MG PO TABS: 25.0000 mg | ORAL_TABLET | Freq: Four times a day (QID) | ORAL | Status: DC | PRN

## 2014-01-07 MED ORDER — CRANBERRY 500 MG PO CAPS: 500.0000 mg | ORAL_CAPSULE | Freq: Two times a day (BID) | ORAL | Status: DC

## 2014-01-07 MED ORDER — ASPIRIN EC 81 MG PO TBEC
81.0000 mg | DELAYED_RELEASE_TABLET | Freq: Every day | ORAL | Status: DC
  Administered 2014-01-07 (×2): 81 mg via ORAL
  Filled 2014-01-07 (×4): qty 1

## 2014-01-07 MED ORDER — FERROUS SULFATE 325 (65 FE) MG PO TABS
325.0000 mg | ORAL_TABLET | Freq: Every day | ORAL | Status: DC
  Administered 2014-01-07 – 2014-01-08 (×2): 325 mg via ORAL
  Filled 2014-01-07 (×3): qty 1

## 2014-01-07 MED ORDER — BIOTENE DRY MOUTH MT LIQD
15.0000 mL | Freq: Two times a day (BID) | OROMUCOSAL | Status: DC
  Administered 2014-01-07 – 2014-01-08 (×3): 15 mL via OROMUCOSAL

## 2014-01-07 MED ORDER — PANTOPRAZOLE SODIUM 40 MG PO TBEC
40.0000 mg | DELAYED_RELEASE_TABLET | Freq: Every day | ORAL | Status: DC
  Administered 2014-01-07 – 2014-01-08 (×2): 40 mg via ORAL
  Filled 2014-01-07 (×2): qty 1

## 2014-01-07 MED ORDER — ALBUTEROL SULFATE (2.5 MG/3ML) 0.083% IN NEBU: 3.0000 mL | INHALATION_SOLUTION | RESPIRATORY_TRACT | Status: DC | PRN

## 2014-01-07 MED ORDER — ATORVASTATIN CALCIUM 10 MG PO TABS
10.0000 mg | ORAL_TABLET | Freq: Every day | ORAL | Status: DC
  Administered 2014-01-07 – 2014-01-08 (×2): 10 mg via ORAL
  Filled 2014-01-07 (×2): qty 1

## 2014-01-07 MED ORDER — LORATADINE 10 MG PO TABS
10.0000 mg | ORAL_TABLET | Freq: Every morning | ORAL | Status: DC
  Administered 2014-01-07 – 2014-01-08 (×2): 10 mg via ORAL
  Filled 2014-01-07 (×2): qty 1

## 2014-01-07 MED ORDER — DILTIAZEM HCL 30 MG PO TABS
30.0000 mg | ORAL_TABLET | Freq: Four times a day (QID) | ORAL | Status: DC
  Administered 2014-01-07 – 2014-01-08 (×4): 30 mg via ORAL
  Filled 2014-01-07 (×8): qty 1

## 2014-01-07 MED ORDER — BUDESONIDE-FORMOTEROL FUMARATE 80-4.5 MCG/ACT IN AERO
2.0000 | INHALATION_SPRAY | Freq: Two times a day (BID) | RESPIRATORY_TRACT | Status: DC
  Administered 2014-01-07 – 2014-01-08 (×3): 2 via RESPIRATORY_TRACT
  Filled 2014-01-07: qty 6.9

## 2014-01-07 MED ORDER — OXYCODONE-ACETAMINOPHEN 5-325 MG PO TABS
1.0000 | ORAL_TABLET | ORAL | Status: DC | PRN
  Administered 2014-01-07 (×4): 1 via ORAL
  Filled 2014-01-07 (×4): qty 1

## 2014-01-07 MED ORDER — OXYCODONE HCL 5 MG PO TABS
5.0000 mg | ORAL_TABLET | ORAL | Status: DC | PRN
  Administered 2014-01-07 (×3): 5 mg via ORAL
  Filled 2014-01-07 (×3): qty 1

## 2014-01-07 MED ORDER — ENOXAPARIN SODIUM 40 MG/0.4ML ~~LOC~~ SOLN
40.0000 mg | SUBCUTANEOUS | Status: DC
  Administered 2014-01-07: 40 mg via SUBCUTANEOUS
  Filled 2014-01-07: qty 0.4

## 2014-01-07 MED ORDER — RIVAROXABAN 20 MG PO TABS
20.0000 mg | ORAL_TABLET | Freq: Every day | ORAL | Status: DC
  Filled 2014-01-07: qty 1

## 2014-01-07 MED ORDER — ONDANSETRON 8 MG PO TBDP
8.0000 mg | ORAL_TABLET | Freq: Four times a day (QID) | ORAL | Status: DC | PRN
  Filled 2014-01-07: qty 1

## 2014-01-07 MED ORDER — CHLORHEXIDINE GLUCONATE 0.12 % MT SOLN
15.0000 mL | Freq: Two times a day (BID) | OROMUCOSAL | Status: DC
  Administered 2014-01-07: 15 mL via OROMUCOSAL
  Filled 2014-01-07 (×5): qty 15

## 2014-01-07 MED ORDER — VITAMIN D3 25 MCG (1000 UNIT) PO TABS
1000.0000 [IU] | ORAL_TABLET | Freq: Two times a day (BID) | ORAL | Status: DC
  Administered 2014-01-07 – 2014-01-08 (×3): 1000 [IU] via ORAL
  Filled 2014-01-07 (×4): qty 1

## 2014-01-07 MED ORDER — NITROGLYCERIN 0.4 MG SL SUBL: 0.4000 mg | SUBLINGUAL_TABLET | SUBLINGUAL | Status: DC | PRN

## 2014-01-07 MED ORDER — CARVEDILOL 12.5 MG PO TABS
18.7500 mg | ORAL_TABLET | Freq: Every day | ORAL | Status: DC
  Administered 2014-01-07 – 2014-01-08 (×2): 18.75 mg via ORAL
  Filled 2014-01-07 (×3): qty 1.5

## 2014-01-07 MED ORDER — DULOXETINE HCL 60 MG PO CPEP
60.0000 mg | ORAL_CAPSULE | Freq: Every day | ORAL | Status: DC
  Administered 2014-01-07 – 2014-01-08 (×2): 60 mg via ORAL
  Filled 2014-01-07 (×2): qty 1

## 2014-01-07 MED ORDER — ASPIRIN EC 81 MG PO TBEC: 81.0000 mg | DELAYED_RELEASE_TABLET | Freq: Every day | ORAL | Status: DC

## 2014-01-07 MED ORDER — CARVEDILOL 12.5 MG PO TABS
12.5000 mg | ORAL_TABLET | Freq: Every day | ORAL | Status: DC
  Administered 2014-01-07: 12.5 mg via ORAL
  Filled 2014-01-07 (×2): qty 1

## 2014-01-07 NOTE — Progress Notes (Signed)
Primary Cardiologist: Charlton Haws MD  Subjective:    Tired, complaining of chest discomfort from under left breast to axillary area and left back.   Objective:   Temp:  [97.8 F (36.6 C)-98.5 F (36.9 C)] 98.5 F (36.9 C) (05/24 0552) Pulse Rate:  [44-121] 70 (05/24 0859) Resp:  [11-22] 18 (05/24 0552) BP: (95-142)/(60-93) 110/60 mmHg (05/24 0859) SpO2:  [94 %-99 %] 94 % (05/24 0552) Weight:  [191 lb 6.4 oz (86.818 kg)] 191 lb 6.4 oz (86.818 kg) (05/24 0045) Last BM Date: 01/06/14  Filed Weights   01/07/14 0045  Weight: 191 lb 6.4 oz (86.818 kg)    Intake/Output Summary (Last 24 hours) at 01/07/14 0914 Last data filed at 01/07/14 0855  Gross per 24 hour  Intake      0 ml  Output    400 ml  Net   -400 ml    Telemetry: Atrial fibrillation rates in the 70's and 80's.   Exam:  General: No acute distress.  HEENT: Conjunctiva and lids normal, oropharynx clear.  Lungs: Clear to auscultation, nonlabored.  Cardiac: No elevated JVP or bruits. IRRR, no gallop or rub.   Abdomen: Normoactive bowel sounds, nontender, nondistended.  Extremities: No pitting edema, distal pulses full.  Neuropsychiatric: Alert and oriented x3, affect appropriate.   Lab Results:  Basic Metabolic Panel:  Recent Labs Lab 01/06/14 2020 01/07/14 0330  NA 140 138  K 3.2* 3.6*  CL 100 101  CO2 22 22  GLUCOSE 123* 113*  BUN 16 17  CREATININE 1.15* 1.20*  CALCIUM 9.4 9.2    CBC:  Recent Labs Lab 01/06/14 2020 01/07/14 0330  WBC 3.2* 3.9*  HGB 12.2 12.2  HCT 36.5 36.9  MCV 89.9 90.7  PLT 199 204    BNP:  Recent Labs  01/21/13 1209 01/06/14 2020  PROBNP 74.8 30.9   Radiology: Dg Chest Port 1 View  01/06/2014   CLINICAL DATA:  Left-sided chest pain  EXAM: PORTABLE CHEST - 1 VIEW  COMPARISON:  01/21/2013  FINDINGS: The heart size and mediastinal contours are within normal limits. Both lungs are clear. The visualized skeletal structures are unremarkable.   IMPRESSION: No active disease.   Electronically Signed   By: Amie Portland M.D.   On: 01/06/2014 20:20     ECG: Atrial fibrillation rate of 90 bpm   Medications:   Scheduled Medications: . antiseptic oral rinse  15 mL Mouth Rinse q12n4p  . aspirin EC  81 mg Oral QHS  . atorvastatin  10 mg Oral Daily  . budesonide-formoterol  2 puff Inhalation BID  . calcium carbonate  1 tablet Oral Q breakfast  . carvedilol  12.5 mg Oral QPC supper  . carvedilol  18.75 mg Oral Q breakfast  . chlorhexidine  15 mL Mouth Rinse BID  . cholecalciferol  1,000 Units Oral BID  . dofetilide  250 mcg Oral BID  . doxazosin  4 mg Oral Daily  . DULoxetine  60 mg Oral Daily  . enoxaparin (LOVENOX) injection  40 mg Subcutaneous Q24H  . ferrous sulfate  325 mg Oral Q breakfast  . insulin glargine  30 Units Subcutaneous QHS  . levETIRAcetam  500 mg Oral Q12H  . loratadine  10 mg Oral q morning - 10a  . niacin  500 mg Oral QHS  . pantoprazole  40 mg Oral Daily  . potassium chloride  10 mEq Oral Daily  . pregabalin  25 mg Oral TID  . vitamin  C  500 mg Oral BID    Infusions: . diltiazem (CARDIZEM) infusion 5 mg/hr (01/06/14 2040)    PRN Medications: albuterol, docusate sodium, nitroGLYCERIN, ondansetron, oxyCODONE, oxyCODONE-acetaminophen, promethazine, SUMAtriptan   Assessment and Plan:   1.Chest Pain: Pressure associated with rapid HR, but has lingering pain under left breast radiating round to back.  Comes and goes,sometime with movement. Cardiac enzymes have not been ordered. Will do so to rule out cardiac etiology of chest discomfort in the setting of elevated HR with Atrial fib. Potassium is low normal. Will give one dose of potassium to have level at 4.0.  Cardiac cath in 2012 which demonstrated normal coronaries.  2. Atrial fibrillation: Long standing history of PAF . She will be allowed to eat, as she will not be cardioverted today. Echo for LV fx will be completed as one has not been done since  2009. She states she is compliant with her medications. She has chronic pain.   She was last seen by Dr. Ladona Ridgelaylor on 01/21/2013. He continued [name] and carvedilol at that time. She is not on anticoagulation at this time. CHADs VASC score 3. Would benefit from novel anticoagulation therapy.Xarelto is recommended for ease of administration. Creatinine is 1.20.   Stop ASA if this is started. Continues on cardizem gtt at 5 mg/hr. Will transition to po 30 mg Q 6 hours with plans to make long acting if tolerates.   3. Hypertension: Currently well controlled on multiple mediations to include coreg and diltazem.  4. Chronic pain: Question neuralgia from diabetes.   5. Diabetes: Continues insulin for management. Also on metformin.    Toni MareKathryn M. Lawrence NP  01/07/2014, 9:14 AM  Agree with note by Joni ReiningKathryn Lawrence PA-C.  Pt of Dr Fabio BeringNishan's with PAF on Dofetilide and coreg. Nl cors and LV fxn. On ASA. Admitted with AFIB with RVR. On dilt drip and rate controlled as well as Lovenox. Labs remarkable for low K+---> repleted. Exam benign. Plan Xarelto, convert iv to po dilt. Plan TEE DCCV Tuesday.  Runell GessJonathan J. Kiante Ciavarella, M.D., FACP, [facility] PcFACC, Earl LagosFAHA, [facility]SC[facility] [address] [place], [place]  [id number]  [phone] 01/07/2014 10:12 AM

## 2014-01-07 NOTE — Progress Notes (Signed)
  Echocardiogram 2D Echocardiogram has been performed.  [name] 01/07/2014, 4:17 PM

## 2014-01-07 NOTE — ED Notes (Addendum)
PAGE CARDIOLOGIST RE: CHEST WALL PAIN. Dr. Charline Bills, "pt. To go to 3 W. Non-cardiac." Crystal updated.

## 2014-01-07 NOTE — ED Provider Notes (Signed)
Medical screening examination/treatment/procedure(s) were conducted as a shared visit with non-physician practitioner(s) and myself.  I personally evaluated the patient during the encounter. Pt presents w/ palpitations, CP, and is in afib w/ RVR, rate 120-140's upon arrival. Pt started on dilt gtt for rate control. Trop negative. Cards consulted & will admit.   EKG Interpretation   Date/Time:  Saturday Jan 06 2014 19:34:22 EDT Ventricular Rate:  131 PR Interval:    QRS Duration: 80 QT Interval:  331 QTC Calculation: 489 R Axis:   -31 Text Interpretation:  Atrial fibrillation Ventricular premature complex  Left axis deviation Abnormal R-wave progression, early transition  Borderline T abnormalities, anterior leads Prior EKG with NSR Confirmed by  Zayyan Mullen  MD, Prathik Aman ([id number]) on 01/06/2014 7:44:05 PM        Shanna Cisco, MD 01/07/14 1039

## 2014-01-07 NOTE — ED Notes (Signed)
Dr. Don Broach stated, "pt. To go to 3W b/c chest wall pain is non-cardiac."

## 2014-01-08 ENCOUNTER — Encounter (HOSPITAL_COMMUNITY): Payer: Self-pay | Admitting: General Practice

## 2014-01-08 DIAGNOSIS — I10 Essential (primary) hypertension: Secondary | ICD-10-CM

## 2014-01-08 DIAGNOSIS — E669 Obesity, unspecified: Secondary | ICD-10-CM

## 2014-01-08 DIAGNOSIS — Z5181 Encounter for therapeutic drug level monitoring: Secondary | ICD-10-CM

## 2014-01-08 DIAGNOSIS — E785 Hyperlipidemia, unspecified: Secondary | ICD-10-CM

## 2014-01-08 DIAGNOSIS — E119 Type 2 diabetes mellitus without complications: Secondary | ICD-10-CM

## 2014-01-08 DIAGNOSIS — Z7901 Long term (current) use of anticoagulants: Secondary | ICD-10-CM

## 2014-01-08 LAB — BASIC METABOLIC PANEL
BUN: 18 mg/dL (ref 6–23)
CO2: 22 mEq/L (ref 19–32)
Calcium: 9.2 mg/dL (ref 8.4–10.5)
Chloride: 104 mEq/L (ref 96–112)
Creatinine, Ser: 1.11 mg/dL — ABNORMAL HIGH (ref 0.50–1.10)
GFR calc Af Amer: 61 mL/min — ABNORMAL LOW (ref >90)
GFR, EST NON AFRICAN AMERICAN: 52 mL/min — AB (ref >90)
Glucose, Bld: 136 mg/dL — ABNORMAL HIGH (ref 70–99)
Potassium: 4.3 mEq/L (ref 3.7–5.3)
Sodium: 138 mEq/L (ref 137–147)

## 2014-01-08 LAB — GLUCOSE, CAPILLARY
Glucose-Capillary: 119 mg/dL — ABNORMAL HIGH (ref 70–99)
Glucose-Capillary: 92 mg/dL (ref 70–99)

## 2014-01-08 MED ORDER — RIVAROXABAN 20 MG PO TABS: 20.0000 mg | ORAL_TABLET | Freq: Every day | ORAL | Status: DC

## 2014-01-08 MED ORDER — DILTIAZEM HCL 60 MG PO TABS
60.0000 mg | ORAL_TABLET | Freq: Two times a day (BID) | ORAL | Status: DC
  Filled 2014-01-08: qty 1

## 2014-01-08 MED ORDER — DILTIAZEM HCL 60 MG PO TABS: 60.0000 mg | ORAL_TABLET | Freq: Two times a day (BID) | ORAL | Status: DC

## 2014-01-08 NOTE — Progress Notes (Signed)
SUBJECTIVE: Pt feeling well this morning and without complaints. Didn't sleep well.     Intake/Output Summary (Last 24 hours) at 01/08/14 0724 Last data filed at 01/07/14 2030  Gross per 24 hour  Intake    480 ml  Output    300 ml  Net    180 ml    Current Facility-Administered Medications  Medication Dose Route Frequency Provider Last Rate Last Dose  . albuterol (PROVENTIL) (2.5 MG/3ML) 0.083% nebulizer solution 3 mL  3 mL Inhalation Q4H PRN Crissie Sickles, MD      . antiseptic oral rinse (BIOTENE) solution 15 mL  15 mL Mouth Rinse q12n4p Crissie Sickles, MD   15 mL at 01/07/14 1627  . aspirin EC tablet 81 mg  81 mg Oral QHS Crissie Sickles, MD   81 mg at 01/07/14 2233  . atorvastatin (LIPITOR) tablet 10 mg  10 mg Oral Daily Crissie Sickles, MD   10 mg at 01/07/14 0925  . budesonide-formoterol (SYMBICORT) 80-4.5 MCG/ACT inhaler 2 puff  2 puff Inhalation BID Crissie Sickles, MD   2 puff at 01/07/14 1925  . calcium carbonate (OS-CAL - dosed in mg of elemental calcium) tablet 500 mg of elemental calcium  1 tablet Oral Q breakfast Crissie Sickles, MD   500 mg of elemental calcium at 01/07/14 0925  . carvedilol (COREG) tablet 12.5 mg  12.5 mg Oral QPC supper Crissie Sickles, MD   12.5 mg at 01/07/14 1627  . carvedilol (COREG) tablet 18.75 mg  18.75 mg Oral Q breakfast Crissie Sickles, MD   18.75 mg at 01/07/14 0900  . chlorhexidine (PERIDEX) 0.12 % solution 15 mL  15 mL Mouth Rinse BID Crissie Sickles, MD   15 mL at 01/07/14 0924  . cholecalciferol (VITAMIN D) tablet 1,000 Units  1,000 Units Oral BID Crissie Sickles, MD   1,000 Units at 01/07/14 2157  . diltiazem (CARDIZEM) tablet 30 mg  30 mg Oral 4 times per day Jodelle Gross, NP   30 mg at 01/08/14 0555  . docusate sodium (COLACE) capsule 100 mg  100 mg Oral BID PRN Crissie Sickles, MD      . dofetilide ([name]) capsule 250 mcg  250 mcg Oral BID Crissie Sickles, MD   250 mcg at 01/07/14 2157  . doxazosin (CARDURA) tablet 4 mg  4 mg Oral Daily  Crissie Sickles, MD   4 mg at 01/07/14 0924  . DULoxetine (CYMBALTA) DR capsule 60 mg  60 mg Oral Daily Crissie Sickles, MD   60 mg at 01/07/14 0925  . ferrous sulfate tablet 325 mg  325 mg Oral Q breakfast Crissie Sickles, MD   325 mg at 01/07/14 0859  . insulin glargine (LANTUS) injection 30 Units  30 Units Subcutaneous QHS Crissie Sickles, MD   30 Units at 01/07/14 2157  . levETIRAcetam (KEPPRA) tablet 500 mg  500 mg Oral Q12H Crissie Sickles, MD   500 mg at 01/07/14 2157  . loratadine (CLARITIN) tablet 10 mg  10 mg Oral q morning - 10a Crissie Sickles, MD   10 mg at 01/07/14 0924  . niacin (NIASPAN) CR tablet 500 mg  500 mg Oral QHS Crissie Sickles, MD   500 mg at 01/07/14 2157  . nitroGLYCERIN (NITROSTAT) SL tablet 0.4 mg  0.4 mg Sublingual Q5 min PRN Crissie Sickles, MD      .  ondansetron (ZOFRAN-ODT) disintegrating tablet 8 mg  8 mg Oral Q6H PRN Crissie SicklesMian A Yousuf, MD      . oxyCODONE-acetaminophen (PERCOCET/ROXICET) 5-325 MG per tablet 1 tablet  1 tablet Oral Q4H PRN Crissie SicklesMian A Yousuf, MD   1 tablet at 01/07/14 2157   And  . oxyCODONE (Oxy IR/ROXICODONE) immediate release tablet 5 mg  5 mg Oral Q4H PRN Crissie SicklesMian A Yousuf, MD   5 mg at 01/07/14 2157  . pantoprazole (PROTONIX) EC tablet 40 mg  40 mg Oral Daily Crissie SicklesMian A Yousuf, MD   40 mg at 01/07/14 0924  . potassium chloride (K-DUR) CR tablet 10 mEq  10 mEq Oral Daily Crissie SicklesMian A Yousuf, MD   10 mEq at 01/07/14 0925  . pregabalin (LYRICA) capsule 25 mg  25 mg Oral TID Crissie SicklesMian A Yousuf, MD   25 mg at 01/07/14 2156  . promethazine (PHENERGAN) tablet 25 mg  25 mg Oral Q6H PRN Crissie SicklesMian A Yousuf, MD      . rivaroxaban (XARELTO) tablet 20 mg  20 mg Oral Q supper Crissie SicklesMian A Yousuf, MD   20 mg at 01/07/14 1627  . SUMAtriptan (IMITREX) tablet 50 mg  50 mg Oral Q6H PRN Crissie SicklesMian A Yousuf, MD      . vitamin C (ASCORBIC ACID) tablet 500 mg  500 mg Oral BID Crissie SicklesMian A Yousuf, MD   500 mg at 01/07/14 2157    Filed Vitals:   01/07/14 1925 01/07/14 2115 01/07/14 2324 01/08/14 0543  BP:  110/63 97/60 107/64    Pulse: 65 60 64 60  Temp:  98 F (36.7 C)  98.6 F (37 C)  TempSrc:  Oral  Oral  Resp: 16 19 18 20   Height:      Weight:    192 lb 0.3 oz (87.1 kg)  SpO2: 98% 100% 96% 96%    PHYSICAL EXAM General: NAD Neck: No JVD, no thyromegaly.  Lungs: Clear to auscultation bilaterally with normal respiratory effort. CV: Nondisplaced PMI.  Regular rate and rhythm, normal S1/S2, no S3/S4, no murmur.  No pretibial edema.  No carotid bruit.  Normal pedal pulses.  Abdomen: Soft, nontender, no hepatosplenomegaly, no distention.  Neurologic: Alert and oriented x 3.  Psych: Normal affect. Extremities: No clubbing or cyanosis.   TELEMETRY: Reviewed telemetry pt in sinus rhythm.  LABS: Basic Metabolic Panel:  Recent Labs  16/05/9604/24/15 0330 01/08/14 0036  NA 138 138  K 3.6* 4.3  CL 101 104  CO2 22 22  GLUCOSE 113* 136*  BUN 17 18  CREATININE 1.20* 1.11*  CALCIUM 9.2 9.2   Liver Function Tests: No results found for this basename: AST, ALT, ALKPHOS, BILITOT, PROT, ALBUMIN,  in the last 72 hours No results found for this basename: LIPASE, AMYLASE,  in the last 72 hours CBC:  Recent Labs  01/06/14 2020 01/07/14 0330  WBC 3.2* 3.9*  NEUTROABS 1.3*  --   HGB 12.2 12.2  HCT 36.5 36.9  MCV 89.9 90.7  PLT 199 204   Cardiac Enzymes: No results found for this basename: CKTOTAL, CKMB, CKMBINDEX, TROPONINI,  in the last 72 hours BNP: No components found with this basename: POCBNP,  D-Dimer: No results found for this basename: DDIMER,  in the last 72 hours Hemoglobin A1C: No results found for this basename: HGBA1C,  in the last 72 hours Fasting Lipid Panel: No results found for this basename: CHOL, HDL, LDLCALC, TRIG, CHOLHDL, LDLDIRECT,  in the last 72 hours Thyroid Function Tests: No results found for  this basename: TSH, T4TOTAL, FREET3, T3FREE, THYROIDAB,  in the last 72 hours Anemia Panel: No results found for this basename: VITAMINB12, FOLATE, FERRITIN, TIBC, IRON, RETICCTPCT,  in  the last 72 hours  RADIOLOGY: Dg Chest Port 1 View  01/06/2014   CLINICAL DATA:  Left-sided chest pain  EXAM: PORTABLE CHEST - 1 VIEW  COMPARISON:  01/21/2013  FINDINGS: The heart size and mediastinal contours are within normal limits. Both lungs are clear. The visualized skeletal structures are unremarkable.  IMPRESSION: No active disease.   Electronically Signed   By: Amie Portland M.D.   On: 01/06/2014 20:20      ASSESSMENT AND PLAN: 1.Chest Pain: Resolved with restoration of sinus rhythm. Cardiac cath in 2012 demonstrated normal coronaries.   2. Atrial fibrillation: Now in normal sinus rhythm and asymptomatic. Long standing history of PAF. Will continue [name] and carvedilol and switch oral diltiazem to 60 mg bid. CHADs VASC score 3, Xarelto started 5/24. Will d/c ASA.  3. Hypertension: Currently well controlled on multiple mediations including Coreg and diltiazem.   4. Chronic pain: Stable.  5. Diabetes: Continues on insulin and metformin.  Dispo: d/c to home today.  Prentice Docker, M.D., F.A.C.C.

## 2014-01-08 NOTE — Discharge Summary (Signed)
Physician Discharge Summary  Patient ID: [name] MRN: [id number] DOB/AGE: [birth year] 63 y.o.  Admit date: 01/06/2014 Discharge date: 01/08/2014  Primary Discharge Diagnosis:  1. Atrial fib with RVR 2. Chest Pain  Secondary Discharge Diagnosis 1, Hypertension 2. Hyperlipidemia 3. Sarcodosis  Primary Cardiologist: Nishan  Significant Diagnostic Studies: 1. None  Hospital Course:    Mrs. [name] is a 63 year old patient of Dr. [name], he was admitted with complaints of chest pain, palpitations, dizziness, nausea, with known history of paroxysmal atrial fibrillation, and diabetes. She was placed on diltiazem drip, and transitioned to by mouth diltiazem. She was continued on carvedilol 12.5 mg twice a day. CHADS score of 3 and therefore was started on Xarelto, aspirin was discontinued.    The patient transition to normal sinus rhythm, on diltiazem drip. Was continued on diltiazem by mouth. Chest discomfort was resolved with resolution of normal sinus rhythm. Blood pressure was well controlled. The patient was continued on antihypertensive medications to include the addition of diltiazem. With normalization of blood pressure. Diabetes was controlled during hospitalization.    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 She will followup with her primary care physician concerning ongoing treatment of this. She will followup with Dr. [name] for ongoing management of atrial fibrillation and other cardiac issues as they percent and sounds. Currently she is stable. She was seen and examined by Dr. Beulah Gandy on rounds and found to be ready for discharge.  Discharge Exam: Blood pressure 107/64, pulse 60, temperature 98.6 F (37 C), temperature source Oral, resp. rate 20, height 5\' 6"  (1.676 m), weight 192 lb 0.3 oz (87.1 kg), SpO2 96.00%.   Labs:   Lab Results  Component Value Date   WBC 3.9* 01/07/2014   HGB 12.2 01/07/2014   HCT 36.9 01/07/2014   MCV 90.7  01/07/2014   PLT 204 01/07/2014     Recent Labs Lab 01/08/14 0036  NA 138  K 4.3  CL 104  CO2 22  BUN 18  CREATININE 1.11*  CALCIUM 9.2  GLUCOSE 136*   Lab Results  Component Value Date   CKTOTAL 506* 01/15/2011   CKMB 2.7 01/15/2011   TROPONINI <0.30 01/21/2013    Lab Results  Component Value Date   CHOL  Value: 247        ATP III CLASSIFICATION:  <200     mg/dL   Desirable  027-253  mg/dL   Borderline High  >=664    mg/dL   High       * 11/17/4740   CHOL  Value: 219        ATP III CLASSIFICATION:  <200     mg/dL   Desirable  595-638  mg/dL   Borderline High  >=756    mg/dL   High       * 4/33/2951   CHOL 131 11/20/2009   Lab Results  Component Value Date   HDL 50 09/08/2010   HDL 47 8/84/1660   HDL 45 01/18/159   Lab Results  Component Value Date   LDLCALC  Value: UNABLE TO CALCULATE IF TRIGLYCERIDE OVER 400 mg/dL        Total Cholesterol/HDL:CHD Risk Coronary Heart Disease Risk Table                     Men   Women  1/2 Average Risk   3.4   3.3  Average Risk       5.0   4.4  2 X Average Risk   9.6   7.1  3 X Average Risk  23.4   11.0        Use the calculated Patient Ratio above and the CHD Risk Table to determine the patient's CHD Risk.        ATP III CLASSIFICATION (LDL):  <100     mg/dL   Optimal  161-096100-129  mg/dL   Near or Above                    Optimal  130-159  mg/dL   Borderline  045-409160-189  mg/dL   High  >811>190     mg/dL   Very High 9/14/78291/23/2012   LDLCALC  Value: UNABLE TO CALCULATE IF TRIGLYCERIDE OVER 400 mg/dL        Total Cholesterol/HDL:CHD Risk Coronary Heart Disease Risk Table                     Men   Women  1/2 Average Risk   3.4   3.3  Average Risk       5.0   4.4  2 X Average Risk   9.6   7.1  3 X Average Risk  23.4   11.0        Use the calculated Patient Ratio above and the CHD Risk Table to determine the patient's CHD Risk.        ATP III CLASSIFICATION (LDL):  <100     mg/dL   Optimal  562-130100-129  mg/dL   Near or Above                    Optimal  130-159  mg/dL   Borderline   865-784160-189  mg/dL   High  >696>190     mg/dL   Very High 2/95/28411/21/2012   LDLCALC 58 11/20/2009   Lab Results  Component Value Date   TRIG 427* 09/08/2010   TRIG 759* 09/06/2010   TRIG 138 11/20/2009   Lab Results  Component Value Date   CHOLHDL 4.9 09/08/2010   CHOLHDL 4.7 09/06/2010   CHOLHDL 2.9 Ratio 11/20/2009   No results found for this basename: LDLDIRECT      Radiology: Dg Chest Port 1 View  01/06/2014   CLINICAL DATA:  Left-sided chest pain  EXAM: PORTABLE CHEST - 1 VIEW  COMPARISON:  01/21/2013  FINDINGS: The heart size and mediastinal contours are within normal limits. Both lungs are clear. The visualized skeletal structures are unremarkable.  IMPRESSION: No active disease.   Electronically Signed   By: Amie Portlandavid  Ormond M.D.   On: 01/06/2014 20:20    FOLLOW UP PLANS AND APPOINTMENTS     Discharge Instructions   Diet - low sodium heart healthy    Complete by:  As directed      Increase activity slowly    Complete by:  As directed             Medication List    STOP taking these medications       aspirin EC 81 MG tablet      TAKE these medications       albuterol 108 (90 BASE) MCG/ACT inhaler  Commonly known as:  PROVENTIL HFA;VENTOLIN HFA  Inhale 2 puffs into the lungs every 4 (four) hours as needed for wheezing or shortness of breath.     atorvastatin 10 MG tablet  Commonly known as:  LIPITOR  Take 10 mg by mouth daily.  budesonide-formoterol 80-4.5 MCG/ACT inhaler  Commonly known as:  SYMBICORT  Inhale 2 puffs into the lungs 2 (two) times daily.     calcium carbonate 600 MG Tabs tablet  Commonly known as:  OS-CAL  Take 1 tablet (600 mg total) by mouth daily.     carvedilol 12.5 MG tablet  Commonly known as:  COREG  Take 12.5-18.75 mg by mouth 2 (two) times daily with a meal. Takes 1.5 tablets every morning and takes 1 tablets every evening.     cholecalciferol 1000 UNITS tablet  Commonly known as:  VITAMIN D  Take 1,000 Units by mouth 2 (two) times daily.      Cranberry 500 MG Caps  Take 500 mg by mouth 2 (two) times daily.     diltiazem 60 MG tablet  Commonly known as:  CARDIZEM  Take 1 tablet (60 mg total) by mouth 2 (two) times daily.     docusate sodium 100 MG capsule  Commonly known as:  COLACE  Take 100 mg by mouth 2 (two) times daily as needed for constipation. For stool softner     dofetilide 250 MCG capsule  Commonly known as:  [name]  Take 1 capsule (250 mcg total) by mouth 2 (two) times daily.     doxazosin 4 MG tablet  Commonly known as:  CARDURA  Take 4 mg by mouth daily.     DULoxetine 60 MG capsule  Commonly known as:  CYMBALTA  Take 60 mg by mouth daily.     esomeprazole 40 MG capsule  Commonly known as:  NEXIUM  Take 40 mg by mouth 2 (two) times daily.     ferrous sulfate 325 (65 FE) MG tablet  Take 325 mg by mouth daily with breakfast.     insulin glargine 100 UNIT/ML injection  Commonly known as:  LANTUS  Inject 40 Units into the skin at bedtime.     levETIRAcetam 500 MG tablet  Commonly known as:  KEPPRA  Take 1 tablet (500 mg total) by mouth every 12 (twelve) hours. Take 1 in the morning and 2 at night.     loratadine 10 MG tablet  Commonly known as:  CLARITIN  Take 10 mg by mouth every morning.     metFORMIN 500 MG tablet  Commonly known as:  GLUCOPHAGE  Take 500 mg by mouth 2 (two) times daily with a meal.     niacin 500 MG CR tablet  Commonly known as:  NIASPAN  Take 500 mg by mouth at bedtime.     nitroGLYCERIN 0.4 MG SL tablet  Commonly known as:  NITROSTAT  Place 0.4 mg under the tongue every 5 (five) minutes as needed for chest pain.     ondansetron 8 MG disintegrating tablet  Commonly known as:  ZOFRAN-ODT  Take 8 mg by mouth every 6 (six) hours as needed for nausea.     oxyCODONE-acetaminophen 10-325 MG per tablet  Commonly known as:  PERCOCET  Take 1 tablet by mouth every 4 (four) hours as needed for pain.     potassium chloride 10 MEQ tablet  Commonly known as:  K-DUR  Take 1  tablet (10 mEq total) by mouth daily.     pregabalin 25 MG capsule  Commonly known as:  LYRICA  Take 25 mg by mouth 3 (three) times daily.     promethazine 25 MG tablet  Commonly known as:  PHENERGAN  Take 1 tablet (25 mg total) by mouth every 6 (six) hours as  needed for nausea.     rivaroxaban 20 MG Tabs tablet  Commonly known as:  XARELTO  Take 1 tablet (20 mg total) by mouth daily with supper.     rizatriptan 10 MG tablet  Commonly known as:  MAXALT  Take 10 mg by mouth daily as needed for migraine. For migraines. May repeat in 2 hours if needed.     VICTOZA 18 MG/3ML Soln injection  Generic drug:  Liraglutide  Inject 1.8 mg into the skin daily.     vitamin C 500 MG tablet  Commonly known as:  ASCORBIC ACID  Take 500 mg by mouth 2 (two) times daily.       Follow-up Information   Follow up with Charlton Haws, MD. (Our office will call your for appt. )    Specialty:  Cardiology   Contact information:   [address] [place] [address] [phone]         Time spent with patient to include physician time: 30 minutes Signed: Jodelle Gross 01/08/2014, 8:22 AM Co-Sign MD

## 2014-01-08 NOTE — Progress Notes (Addendum)
CARE MANAGEMENT NOTE 01/08/2014  Patient:  [name], [name]   Account Number:  [id number]  Date Initiated:  01/08/2014  Documentation initiated by:  [facility]  Subjective/Objective Assessment:   Atrial fib with RVR     Action/Plan:   home alone   Anticipated DC Date:  01/08/2014   Anticipated DC Plan:  HOME/SELF CARE      DC Planning Services  CM consult  Medication Assistance      Choice offered to / List presented to:             Status of service:  Completed, signed off Medicare Important Message given?   (If response is "NO", the following Medicare IM given date fields will be blank) Date Medicare IM given:   Date Additional Medicare IM given:    Discharge Disposition:  HOME/SELF CARE  Per UR Regulation:    If discussed at Long Length of Stay Meetings, dates discussed:    Comments:  01/08/2014 1000 NCM spoke to pt and provided pt with a Xarelto 30 day trial card. Pt states he goes to [facility]. Contacted [facility] and they are not open today. Pt states she will find another pharmacy that is close to her home. Her transportation is limited. Explained to pt she can utilize [facility] on [address] for meds.   Rx given to pt for medications. Isidoro Donning RN CCM Case Mgmt phone [phone]

## 2014-01-08 NOTE — Discharge Instructions (Signed)

## 2014-01-09 ENCOUNTER — Encounter (HOSPITAL_COMMUNITY): Payer: Medicaid Other

## 2014-01-09 ENCOUNTER — Ambulatory Visit (HOSPITAL_COMMUNITY): Admit: 2014-01-09 | Payer: Self-pay | Admitting: Cardiology

## 2014-01-09 SURGERY — ECHOCARDIOGRAM, TRANSESOPHAGEAL
Anesthesia: Monitor Anesthesia Care

## 2014-01-19 ENCOUNTER — Encounter: Payer: Self-pay | Admitting: Cardiovascular Disease

## 2014-01-19 ENCOUNTER — Telehealth: Payer: Self-pay | Admitting: Cardiovascular Disease

## 2014-01-19 ENCOUNTER — Ambulatory Visit (INDEPENDENT_AMBULATORY_CARE_PROVIDER_SITE_OTHER): Payer: Medicaid Other | Admitting: Cardiovascular Disease

## 2014-01-19 VITALS — BP 122/79 | HR 65 | Ht 66.0 in | Wt 192.8 lb

## 2014-01-19 DIAGNOSIS — I4891 Unspecified atrial fibrillation: Secondary | ICD-10-CM

## 2014-01-19 DIAGNOSIS — I10 Essential (primary) hypertension: Secondary | ICD-10-CM

## 2014-01-19 NOTE — Assessment & Plan Note (Signed)
Well controlled.  Continue current medications and low sodium Dash type diet.    

## 2014-01-19 NOTE — Telephone Encounter (Signed)
PT  AWARE MAY STOP  BABY ASA DID NOT SEE ON MED LIST./CY

## 2014-01-19 NOTE — Telephone Encounter (Signed)
New problem    Pt has a question about taking baby Asprin.  Please give her a call back.

## 2014-01-19 NOTE — Progress Notes (Signed)
Patient ID: [name], female   DOB: 06/29/1951, 63 y.o.   MRN: [id number]   63 yo HTN and PAF  On tikosyn.  Recent hospitalization with PAF converted with cardizem  Xarelto started.  Normal cath in 2012   Echo 5/24 normal EF and only mild LAE  Study Conclusions  - Left ventricle: The cavity size was normal. Wall thickness was increased in a pattern of mild LVH. Systolic function was normal. The estimated ejection fraction was in the range of 60% to 65%. Wall motion was normal; there were no regional wall motion abnormalities. Doppler parameters are consistent with abnormal left ventricular relaxation (grade 1 diastolic dysfunction). - Left atrium: The atrium was mildly dilated.  Doing well with no recurrent palpitations     ROS: Denies fever, malais, weight loss, blurry vision, decreased visual acuity, cough, sputum, SOB, hemoptysis, pleuritic pain, palpitaitons, heartburn, abdominal pain, melena, lower extremity edema, claudication, or rash.  All other systems reviewed and negative   General: Affect appropriate Healthy:  appears stated age HEENT: normal Neck supple with no adenopathy JVP normal no bruits no thyromegaly Lungs clear with no wheezing and good diaphragmatic motion Heart:  S1/S2 no murmur,rub, gallop or click PMI normal Abdomen: benighn, BS positve, no tenderness, no AAA no bruit.  No HSM or HJR Distal pulses intact with no bruits No edema Neuro non-focal Skin warm and dry No muscular weakness  Medications Current Outpatient Prescriptions  Medication Sig Dispense Refill  . albuterol (PROVENTIL HFA;VENTOLIN HFA) 108 (90 BASE) MCG/ACT inhaler Inhale 2 puffs into the lungs every 4 (four) hours as needed for wheezing or shortness of breath.      Marland Kitchen atorvastatin (LIPITOR) 10 MG tablet Take 10 mg by mouth daily.      . budesonide-formoterol (SYMBICORT) 80-4.5 MCG/ACT inhaler Inhale 2 puffs into the lungs 2 (two) times daily.      . calcium carbonate (OS-CAL)  600 MG TABS Take 1 tablet (600 mg total) by mouth daily.      . carvedilol (COREG) 12.5 MG tablet Take 12.5-18.75 mg by mouth 2 (two) times daily with a meal. Takes 1.5 tablets every morning and takes 1 tablets every evening.      . cholecalciferol (VITAMIN D) 1000 UNITS tablet Take 1,000 Units by mouth 2 (two) times daily.       . Cranberry 500 MG CAPS Take 500 mg by mouth 2 (two) times daily.      Marland Kitchen diltiazem (CARDIZEM) 60 MG tablet Take 1 tablet (60 mg total) by mouth 2 (two) times daily.  60 tablet  6  . docusate sodium (COLACE) 100 MG capsule Take 100 mg by mouth 2 (two) times daily as needed for constipation. For stool softner      . dofetilide ([name]) 250 MCG capsule Take 1 capsule (250 mcg total) by mouth 2 (two) times daily.  180 capsule  4  . doxazosin (CARDURA) 4 MG tablet Take 4 mg by mouth daily.      . DULoxetine (CYMBALTA) 60 MG capsule Take 60 mg by mouth daily.      Marland Kitchen esomeprazole (NEXIUM) 40 MG capsule Take 40 mg by mouth 2 (two) times daily.      . ferrous sulfate 325 (65 FE) MG tablet Take 325 mg by mouth daily with breakfast.      . insulin glargine (LANTUS) 100 UNIT/ML injection Inject 40 Units into the skin at bedtime.      . levETIRAcetam (KEPPRA) 500 MG tablet Take 1  tablet (500 mg total) by mouth every 12 (twelve) hours. Take 1 in the morning and 2 at night.  90 tablet  5  . Liraglutide (VICTOZA) 18 MG/3ML SOLN Inject 1.8 mg into the skin daily.      Marland Kitchen. loratadine (CLARITIN) 10 MG tablet Take 10 mg by mouth every morning.      . metFORMIN (GLUCOPHAGE) 500 MG tablet Take 500 mg by mouth 2 (two) times daily with a meal.      . niacin (NIASPAN) 500 MG CR tablet Take 500 mg by mouth at bedtime.       . nitroGLYCERIN (NITROSTAT) 0.4 MG SL tablet Place 0.4 mg under the tongue every 5 (five) minutes as needed for chest pain.      Marland Kitchen. ondansetron (ZOFRAN-ODT) 8 MG disintegrating tablet Take 8 mg by mouth every 6 (six) hours as needed for nausea.      Marland Kitchen. oxyCODONE-acetaminophen  (PERCOCET) 10-325 MG per tablet Take 1 tablet by mouth every 4 (four) hours as needed for pain.      . potassium chloride (K-DUR) 10 MEQ tablet Take 1 tablet (10 mEq total) by mouth daily.  90 tablet  3  . pregabalin (LYRICA) 25 MG capsule Take 25 mg by mouth 3 (three) times daily.       . promethazine (PHENERGAN) 25 MG tablet Take 1 tablet (25 mg total) by mouth every 6 (six) hours as needed for nausea.  10 tablet  0  . rivaroxaban (XARELTO) 20 MG TABS tablet Take 1 tablet (20 mg total) by mouth daily with supper.  30 tablet  0  . rizatriptan (MAXALT) 10 MG tablet Take 10 mg by mouth daily as needed for migraine. For migraines. May repeat in 2 hours if needed.      . vitamin C (ASCORBIC ACID) 500 MG tablet Take 500 mg by mouth 2 (two) times daily.       No current facility-administered medications for this visit.    Allergies Amitriptyline; Hydromorphone hcl; and Prednisone  Family History: Family History  Problem Relation Age of Onset  . Heart attack Mother     @ age 62  . Mental illness Mother   . Diabetes Mother   . Hypertension Mother     siblings  . Heart attack Brother 50  . Colon cancer Maternal Aunt   . Prostate cancer Maternal Grandfather   . Ovarian cancer Maternal Aunt   . Diabetes    . Lupus Sister   . Ovarian cancer Cousin   . Hypertension      Social History: History   Social History  . Marital Status: Divorced    Spouse Name: N/A    Number of Children: 3  . Years of Education: N/A   Occupational History  . disabled     CNA   Social History Main Topics  . Smoking status: Never Smoker   . Smokeless tobacco: Never Used  . Alcohol Use: No  . Drug Use: No  . Sexual Activity: Not Currently   Other Topics Concern  . Not on file   Social History Narrative   Pt. Lives at home with her daughter. She is single, has three children, does not work currently, and has a 12th grade education level. She has never used tobacco or alcohol. Quit using illicit drugs  at the age of 89 and rarely ha caffeine.    Electrocardiogram:  SR rate 66 low voltage   Assessment and Plan

## 2014-01-19 NOTE — Assessment & Plan Note (Signed)
Maint NSR continue tikosyn and xarelto

## 2014-01-19 NOTE — Patient Instructions (Signed)
Your physician recommends that you schedule a follow-up appointment in:  3 MONTHS WITH  DR NISHAN  Your physician recommends that you continue on your current medications as directed. Please refer to the Current Medication list given to you today.  

## 2014-02-07 ENCOUNTER — Telehealth: Payer: Self-pay | Admitting: Cardiovascular Disease

## 2014-02-07 NOTE — Telephone Encounter (Signed)
MESSAGE FORWARDED TO   REFILL  DEPARTMENT./CY PT  GETS  [name]  FREE FROM   DR[facility].

## 2014-02-07 NOTE — Telephone Encounter (Signed)
New message     Talk to Wynona CanesChristine regarding tikosyn and getting some type of assistance.

## 2014-02-08 ENCOUNTER — Other Ambulatory Visit: Payer: Self-pay | Admitting: Gastroenterology

## 2014-02-08 NOTE — Telephone Encounter (Signed)
Called patient regarding her tikosyn application I haven't received, she states she's not sure whzt to do she has medicaid now, I will fax a PA to MEDICAID for approval.

## 2014-03-07 ENCOUNTER — Other Ambulatory Visit: Payer: Self-pay

## 2014-03-07 ENCOUNTER — Telehealth: Payer: Self-pay

## 2014-03-07 MED ORDER — DOFETILIDE 250 MCG PO CAPS: 250.0000 ug | ORAL_CAPSULE | Freq: Two times a day (BID) | ORAL | Status: DC

## 2014-03-07 NOTE — Telephone Encounter (Signed)
I called medicad to see if a PA was approved for her [name] and it was approved on 02/02/14 through 02-03-15. Ref # [id number]

## 2014-04-24 ENCOUNTER — Ambulatory Visit: Payer: Medicaid Other | Admitting: Cardiovascular Disease

## 2014-04-30 ENCOUNTER — Ambulatory Visit (INDEPENDENT_AMBULATORY_CARE_PROVIDER_SITE_OTHER): Payer: Medicaid Other | Admitting: Diagnostic Neuroimaging

## 2014-04-30 ENCOUNTER — Encounter: Payer: Self-pay | Admitting: Diagnostic Neuroimaging

## 2014-04-30 ENCOUNTER — Encounter (INDEPENDENT_AMBULATORY_CARE_PROVIDER_SITE_OTHER): Payer: Self-pay

## 2014-04-30 VITALS — BP 119/75 | HR 67 | Ht 66.0 in | Wt 194.4 lb

## 2014-04-30 DIAGNOSIS — G40909 Epilepsy, unspecified, not intractable, without status epilepticus: Secondary | ICD-10-CM

## 2014-04-30 MED ORDER — LEVETIRACETAM ER 500 MG PO TB24: 2000.0000 mg | ORAL_TABLET | Freq: Every day | ORAL | Status: DC

## 2014-04-30 NOTE — Progress Notes (Signed)
PATIENT: [name] DOB: [birth year]  REASON FOR VISIT: follow up HISTORY FROM: patient  HISTORY OF PRESENT ILLNESS:  UPDATE 04/30/14: Since last visit, was doing well until May 2015 (had generalized convulsive seizure) then 1 week last went to hospital and dx'd with afib. Now doing better. Not driving.   UPDATE 10/27/13 (LL): Patient returns for followup, last visit was 04/12/13.  She reports that she has not been feeling so well in the past 6 months.  She unknowingly had not been getting refills on her Leviteracetam for 2 months and started having more seizures.  She then got a refill and has been back on Leviteracetam for a month, but still reports 3 seizures during that time, mostly during the night, when she wakes up after having been incontinent of bladder and bowel and with sore muscles.  She has other times during the day when she feels that she is having them in which she feels confused and notices she has lost a few minutes, but these are unwitnessed.  She has been titrated up on her Lyrica to 100 mg TID, still has neuropathy pain, but states she is very sleepy most of the time.  UPDATE 04/12/13 (LL): Patient returns for followup visit since last visit on 10/10/2012. She states that she has not had any more seizures or syncopal episodes since her last visit. She continues to take Keppra 500 mg twice a day with no known side effects. She reports that in earlier in the year she was placed on Neurontin 600 mg 3 times a day by another doctor and felt very badly. She reports having visual hallucinations, nausea, excessive diaphoresis. She had several trips to the emergency room this year for heart rate and blood pressure issues, chest pain, nausea and vomiting, pyelonephritis, and abdominal pain. She has been evaluated by GI, and [facility]. She was taken off Neurontin which was replaced with Lyrica 50 mg 3 times a day by her pain management M.D. Dr. Kumar 2 months ago. Since this  time she reports that she is doing much better. She has no complaints today.   PRIOR HPI 10/10/12 (VRP): 63 year old in female here for evaluation of seizure disorder. Patient had first seizure of life in the fifth grade. She was diagnosed with seizures and treated with phenobarbital. She had generalized convulsions, tongue biting and incontinence. She had seizures until the ninth grade and then to stop. At some point she was taken off phenobarbital because she no longer had seizures. 2012 patient began to have seizures again. She was having intermittent episodes of staring, being in a daze, followed by shaking, tongue biting, vomiting and incontinence. Patient had an episode last night as well.    REVIEW OF SYSTEMS: Full 14 system review of systems performed and notable only for: chills, fatigue, excessive sweating, facial swelling light sens double vision cough SOB palpitations cold intol heat intol excessive thirst joint pain aching muscles cramps speech diff memory loss dizziness passing out depression.   ALLERGIES: Allergies  Allergen Reactions  . Amitriptyline Other (See Comments)    Disoriented.  . Hydromorphone Hcl Other (See Comments)    blood pressure drops.  . Prednisone     SENT INTO AFIB     HOME MEDICATIONS: Outpatient Prescriptions Prior to Visit  Medication Sig Dispense Refill  . albuterol (PROVENTIL HFA;VENTOLIN HFA) 108 (90 BASE) MCG/ACT inhaler Inhale 2 puffs into the lungs every 4 (four) hours as needed for wheezing or shortness of breath.      Marland Kitchen  atorvastatin (LIPITOR) 10 MG tablet Take 10 mg by mouth daily.      . budesonide-formoterol (SYMBICORT) 80-4.5 MCG/ACT inhaler Inhale 2 puffs into the lungs 2 (two) times daily.      . calcium carbonate (OS-CAL) 600 MG TABS Take 1 tablet (600 mg total) by mouth daily.      . carvedilol (COREG) 12.5 MG tablet Take 12.5-18.75 mg by mouth 2 (two) times daily with a meal. Takes 1.5 tablets every morning and takes 1 tablets every  evening.      . cholecalciferol (VITAMIN D) 1000 UNITS tablet Take 1,000 Units by mouth 2 (two) times daily.       . Cranberry 500 MG CAPS Take 500 mg by mouth 2 (two) times daily.      Marland Kitchen diltiazem (CARDIZEM) 60 MG tablet Take 1 tablet (60 mg total) by mouth 2 (two) times daily.  60 tablet  6  . dofetilide ([name]) 250 MCG capsule Take 1 capsule (250 mcg total) by mouth 2 (two) times daily.  180 capsule  4  . doxazosin (CARDURA) 4 MG tablet Take 4 mg by mouth daily.      . DULoxetine (CYMBALTA) 60 MG capsule Take 60 mg by mouth daily.      Marland Kitchen esomeprazole (NEXIUM) 40 MG capsule Take 40 mg by mouth 2 (two) times daily.      . ferrous sulfate 325 (65 FE) MG tablet Take 325 mg by mouth daily with breakfast.      . insulin glargine (LANTUS) 100 UNIT/ML injection Inject 40 Units into the skin at bedtime.      . levETIRAcetam (KEPPRA) 500 MG tablet Take 1 tablet (500 mg total) by mouth every 12 (twelve) hours. Take 1 in the morning and 2 at night.  90 tablet  5  . loratadine (CLARITIN) 10 MG tablet Take 10 mg by mouth every morning.      . metFORMIN (GLUCOPHAGE) 500 MG tablet Take 500 mg by mouth 2 (two) times daily with a meal.      . NEXIUM 40 MG capsule take 1 capsule by mouth twice a day WITH MEALS  60 capsule  2  . niacin (NIASPAN) 500 MG CR tablet Take 500 mg by mouth at bedtime.       . nitroGLYCERIN (NITROSTAT) 0.4 MG SL tablet Place 0.4 mg under the tongue every 5 (five) minutes as needed for chest pain.      Marland Kitchen ondansetron (ZOFRAN-ODT) 8 MG disintegrating tablet Take 8 mg by mouth every 6 (six) hours as needed for nausea.      Marland Kitchen oxyCODONE-acetaminophen (PERCOCET) 10-325 MG per tablet Take 1 tablet by mouth every 4 (four) hours as needed for pain.      . potassium chloride (K-DUR) 10 MEQ tablet Take 1 tablet (10 mEq total) by mouth daily.  90 tablet  3  . pregabalin (LYRICA) 25 MG capsule Take 25 mg by mouth 3 (three) times daily.       . promethazine (PHENERGAN) 25 MG tablet Take 1 tablet (25  mg total) by mouth every 6 (six) hours as needed for nausea.  10 tablet  0  . rivaroxaban (XARELTO) 20 MG TABS tablet Take 1 tablet (20 mg total) by mouth daily with supper.  30 tablet  0  . rizatriptan (MAXALT) 10 MG tablet Take 10 mg by mouth daily as needed for migraine. For migraines. May repeat in 2 hours if needed.      . vitamin C (ASCORBIC ACID)  500 MG tablet Take 500 mg by mouth 2 (two) times daily.      Marland Kitchen docusate sodium (COLACE) 100 MG capsule Take 100 mg by mouth 2 (two) times daily as needed for constipation. For stool softner      . Liraglutide (VICTOZA) 18 MG/3ML SOLN Inject 1.8 mg into the skin daily.       No facility-administered medications prior to visit.     PHYSICAL EXAM  Filed Vitals:   04/30/14 1421  BP: 119/75  Pulse: 67  Height:  (1.676 m)  Weight: 194 lb 6.4 oz (88.179 kg)   Body mass index is 31.39 kg/(m^2).  Physical Exam  General:  in no acute distress. Well developed and groomed.  Neck: Neck is supple.  Cardiovascular: No carotid artery bruits. Heart is regular rate and rhythm with no murmurs.   Neurologic Exam  Mental Status: Patient is awake, but drowsy.  Language is fluent and comprehension intact.  Cranial Nerves: Pupils are equal and reactive to light. Visual fields are full to confrontation. Conjugate eye movements are full and symmetric. Facial sensation and strength are symmetric. Hearing is intact. Palate elevated symmetrically and uvula is midline. Shoulder shrug is symmetric. Tongue is midline.  Motor: Normal bulk and tone. Full strength in the upper and lower extremities. No pronator drift. TREMOR IN OUTSTRETCHED HANDS R>L. Sensory: DECR IN BLE IN STOCKING DISTRIBUTION.  Coordination: No ataxia or dysmetria on finger-nose or rapid alternating movement testing.  Gait and Station: ANTALGIC, UNSTEADY GAIT.  Reflexes: Deep tendon reflexes in the upper and lower extremity are TRACE and symmetric.    DIAGNOSTICS  Lab Results    Component Value Date   WBC 3.9* 01/07/2014   HGB 12.2 01/07/2014   HCT 36.9 01/07/2014   MCV 90.7 01/07/2014   PLT 204 01/07/2014     Chemistry      Component Value Date/Time   NA 138 01/08/2014 0036   K 4.3 01/08/2014 0036   CL 104 01/08/2014 0036   CO2 22 01/08/2014 0036   BUN 18 01/08/2014 0036   CREATININE 1.11* 01/08/2014 0036      Component Value Date/Time   CALCIUM 9.2 01/08/2014 0036   ALKPHOS 106 11/26/2012 2337   AST 26 11/26/2012 2337   ALT 22 11/26/2012 2337   BILITOT 0.2* 11/26/2012 2337      10/17/12 MRI BRAIN - normal     ASSESSMENT AND PLAN  63 y.o. right-handed [place] American female with multiple medical problems seen in our office for SEIZURE DISORDER. Had breakthrough sz in May 2015.   PLAN:  - increase levetiracetam to XR  qhs - no driving due to seizure activity. - follow up visit in 6 months, call for sooner appointment if seizures continue on increased dose.  Return in about 6 months (around 10/29/2014).    Suanne Marker, MD 04/30/2014, 3:47 PM Certified in Neurology, Neurophysiology and Neuroimaging  [facility] [address] [place], [place] [address] [phone]

## 2014-04-30 NOTE — Patient Instructions (Signed)
Change levetiracetam to XR (extended release) 4 x  at bedtime (total ).

## 2014-05-28 ENCOUNTER — Other Ambulatory Visit (HOSPITAL_COMMUNITY): Payer: Self-pay | Admitting: Family Medicine

## 2014-05-28 DIAGNOSIS — Z1231 Encounter for screening mammogram for malignant neoplasm of breast: Secondary | ICD-10-CM

## 2014-06-01 ENCOUNTER — Ambulatory Visit (INDEPENDENT_AMBULATORY_CARE_PROVIDER_SITE_OTHER): Payer: Medicaid Other | Admitting: Cardiovascular Disease

## 2014-06-01 ENCOUNTER — Encounter: Payer: Self-pay | Admitting: Cardiovascular Disease

## 2014-06-01 VITALS — BP 118/70 | HR 74 | Ht 66.0 in | Wt 190.0 lb

## 2014-06-01 DIAGNOSIS — E785 Hyperlipidemia, unspecified: Secondary | ICD-10-CM

## 2014-06-01 DIAGNOSIS — E119 Type 2 diabetes mellitus without complications: Secondary | ICD-10-CM

## 2014-06-01 DIAGNOSIS — I480 Paroxysmal atrial fibrillation: Secondary | ICD-10-CM

## 2014-06-01 NOTE — Progress Notes (Signed)
Patient ID: Toni RosenthalLinda F [name], female   DOB: [birth year], 63 y.o.   MRN: [id number] 63 yo HTN and PAF On tikosyn. Recent hospitalization 5/15  with PAF converted with cardizem Xarelto started. Normal cath in 2012  Echo 5/24 normal EF and only mild LAE  Study Conclusions  - Left ventricle: The cavity size was normal. Wall thickness was increased in a pattern of mild LVH. Systolic function was normal. The estimated ejection fraction was in the range of 60% to 65%. Wall motion was normal; there were no regional wall motion abnormalities. Doppler parameters are consistent with abnormal left ventricular relaxation (grade 1 diastolic dysfunction). - Left atrium: The atrium was mildly dilated.  Doing well with no recurrent palpitations   Seizure disorder followed by neurology Last one also in 5/15 Keppra increased at that time  Discussed need to stay on xarelto with recurrent afib and [place] VASC score of 3     ROS: Denies fever, malais, weight loss, blurry vision, decreased visual acuity, cough, sputum, SOB, hemoptysis, pleuritic pain, palpitaitons, heartburn, abdominal pain, melena, lower extremity edema, claudication, or rash.  All other systems reviewed and negative  General: Affect appropriate Healthy:  appears stated age HEENT: normal Neck supple with no adenopathy JVP normal no bruits no thyromegaly Lungs clear with no wheezing and good diaphragmatic motion Heart:  S1/S2 no murmur, no rub, gallop or click PMI normal Abdomen: benighn, BS positve, no tenderness, no AAA no bruit.  No HSM or HJR Distal pulses intact with no bruits No edema Neuro non-focal Skin warm and dry No muscular weakness   Current Outpatient Prescriptions  Medication Sig Dispense Refill  . albuterol (PROVENTIL HFA;VENTOLIN HFA) 108 (90 BASE) MCG/ACT inhaler Inhale 2 puffs into the lungs every 4 (four) hours as needed for wheezing or shortness of breath.      Marland Kitchen. atorvastatin (LIPITOR) 10 MG tablet Take 10 mg by  mouth daily.      . budesonide-formoterol (SYMBICORT) 80-4.5 MCG/ACT inhaler Inhale 2 puffs into the lungs 2 (two) times daily.      . calcium carbonate (OS-CAL) 600 MG TABS Take 1 tablet (600 mg total) by mouth daily.      . carvedilol (COREG) 12.5 MG tablet Take 12.5-18.75 mg by mouth 2 (two) times daily with a meal. Takes 1.5 tablets every morning and takes 1 tablets every evening.      . cholecalciferol (VITAMIN D) 1000 UNITS tablet Take 1,000 Units by mouth 2 (two) times daily.       . Cranberry 500 MG CAPS Take 500 mg by mouth 2 (two) times daily.      Marland Kitchen. diltiazem (CARDIZEM) 60 MG tablet Take 1 tablet (60 mg total) by mouth 2 (two) times daily.  60 tablet  6  . docusate sodium (COLACE) 100 MG capsule Take 100 mg by mouth 2 (two) times daily as needed for constipation. For stool softner      . dofetilide ([name]) 250 MCG capsule Take 1 capsule (250 mcg total) by mouth 2 (two) times daily.  180 capsule  4  . doxazosin (CARDURA) 4 MG tablet Take 4 mg by mouth daily.      . DULoxetine (CYMBALTA) 60 MG capsule Take 60 mg by mouth daily.      Marland Kitchen. esomeprazole (NEXIUM) 40 MG capsule Take 40 mg by mouth 2 (two) times daily.      . ferrous sulfate 325 (65 FE) MG tablet Take 325 mg by mouth daily with breakfast.      .  insulin glargine (LANTUS) 100 UNIT/ML injection Inject 40 Units into the skin at bedtime.      . levETIRAcetam (KEPPRA XR) 500 MG 24 hr tablet Take 4 tablets (2,000 mg total) by mouth at bedtime.  120 tablet  12  . loratadine (CLARITIN) 10 MG tablet Take 10 mg by mouth every morning.      . metFORMIN (GLUCOPHAGE) 500 MG tablet Take 500 mg by mouth 2 (two) times daily with a meal.      . NEXIUM 40 MG capsule take 1 capsule by mouth twice a day WITH MEALS  60 capsule  2  . niacin (NIASPAN) 500 MG CR tablet Take 500 mg by mouth at bedtime.       . nitroGLYCERIN (NITROSTAT) 0.4 MG SL tablet Place 0.4 mg under the tongue every 5 (five) minutes as needed for chest pain.      Marland Kitchen. ondansetron  (ZOFRAN-ODT) 8 MG disintegrating tablet Take 8 mg by mouth every 6 (six) hours as needed for nausea.      Marland Kitchen. oxyCODONE-acetaminophen (PERCOCET) 10-325 MG per tablet Take 1 tablet by mouth every 4 (four) hours as needed for pain.      . potassium chloride (K-DUR) 10 MEQ tablet Take 1 tablet (10 mEq total) by mouth daily.  90 tablet  3  . pregabalin (LYRICA) 25 MG capsule Take 25 mg by mouth 3 (three) times daily.       . promethazine (PHENERGAN) 25 MG tablet Take 1 tablet (25 mg total) by mouth every 6 (six) hours as needed for nausea.  10 tablet  0  . rivaroxaban (XARELTO) 20 MG TABS tablet Take 1 tablet (20 mg total) by mouth daily with supper.  30 tablet  0  . rizatriptan (MAXALT) 10 MG tablet Take 10 mg by mouth daily as needed for migraine. For migraines. May repeat in 2 hours if needed.      . vitamin C (ASCORBIC ACID) 500 MG tablet Take 500 mg by mouth 2 (two) times daily.       No current facility-administered medications for this visit.    Allergies  Amitriptyline; Hydromorphone hcl; and Prednisone  Electrocardiogram:  5/15 SR rate 61 inferolateral T wave changes  QT 444   Assessment and Plan

## 2014-06-01 NOTE — Assessment & Plan Note (Signed)
Cholesterol is at goal.  Continue current dose of statin and diet Rx.  No myalgias or side effects.  F/U  LFT's in 6 months. Lab Results  Component Value Date   LDLCALC  Value: UNABLE TO CALCULATE IF TRIGLYCERIDE OVER 400 mg/dL        Total Cholesterol/HDL:CHD Risk Coronary Heart Disease Risk Table                     Men   Women  1/2 Average Risk   3.4   3.3  Average Risk       5.0   4.4  2 X Average Risk   9.6   7.1  3 X Average Risk  23.4   11.0        Use the calculated Patient Ratio above and the CHD Risk Table to determine the patient's CHD Risk.        ATP III CLASSIFICATION (LDL):  <100     mg/dL   Optimal  [id number]  mg/dL   Near or Above                    Optimal  130-159  mg/dL   Borderline  [id number]  mg/dL   High  >811>190     mg/dL   Very High 9/14/78291/23/2012

## 2014-06-01 NOTE — Assessment & Plan Note (Signed)
Maint NSR on tikosyn  Continue xarelto  Primary to check BMET next week

## 2014-06-01 NOTE — Patient Instructions (Signed)
Your physician wants you to follow-up in:  6 MONTHS WITH DR NISHAN  You will receive a reminder letter in the mail two months in advance. If you don't receive a letter, please call our office to schedule the follow-up appointment. Your physician recommends that you continue on your current medications as directed. Please refer to the Current Medication list given to you today. 

## 2014-06-01 NOTE — Assessment & Plan Note (Signed)
Discussed low carb diet.  Target hemoglobin A1c is 6.5 or less.  Continue current medications.  

## 2014-06-17 ENCOUNTER — Other Ambulatory Visit: Payer: Self-pay

## 2014-06-17 MED ORDER — POTASSIUM CHLORIDE ER 10 MEQ PO TBCR: 10.0000 meq | EXTENDED_RELEASE_TABLET | Freq: Every day | ORAL | Status: DC

## 2014-06-18 ENCOUNTER — Ambulatory Visit (HOSPITAL_COMMUNITY)
Admission: RE | Admit: 2014-06-18 | Discharge: 2014-06-18 | Disposition: A | Discharge status: Home / Self Care | Payer: Medicaid Other | Source: Ambulatory Visit | Attending: Family Medicine | Admitting: Family Medicine

## 2014-06-18 DIAGNOSIS — Z1231 Encounter for screening mammogram for malignant neoplasm of breast: Secondary | ICD-10-CM | POA: Insufficient documentation

## 2014-06-20 ENCOUNTER — Telehealth: Payer: Self-pay | Admitting: Cardiovascular Disease

## 2014-06-20 NOTE — Telephone Encounter (Signed)
New message      Pt need to have 1 tooth extracted.  Will pt need to stop xeralto?

## 2014-06-20 NOTE — Telephone Encounter (Signed)
Ok to stop xarelto 3 days before tooth extraction and restart in 48 hrs if everything looks ok

## 2014-06-20 NOTE — Telephone Encounter (Signed)
I will forward to Dr Eden EmmsNishan to advise.  I also called oral surgeon/ left msg to see when her app was scheduled.

## 2014-06-21 NOTE — Telephone Encounter (Signed)
Spoke with Beth who is aware.  She will instruct the patient.

## 2014-08-02 ENCOUNTER — Emergency Department (HOSPITAL_COMMUNITY): Payer: Medicaid Other

## 2014-08-02 ENCOUNTER — Inpatient Hospital Stay (HOSPITAL_COMMUNITY)
Admission: EM | Admit: 2014-08-02 | Discharge: 2014-08-04 | DRG: 690 | Disposition: A | Discharge status: Home Health | Payer: Medicaid Other | Attending: Internal Medicine | Admitting: Internal Medicine

## 2014-08-02 ENCOUNTER — Encounter (HOSPITAL_COMMUNITY): Payer: Self-pay | Admitting: Emergency Medicine

## 2014-08-02 DIAGNOSIS — N183 Chronic kidney disease, stage 3 (moderate): Secondary | ICD-10-CM | POA: Diagnosis present

## 2014-08-02 DIAGNOSIS — M797 Fibromyalgia: Secondary | ICD-10-CM | POA: Diagnosis present

## 2014-08-02 DIAGNOSIS — Z87442 Personal history of urinary calculi: Secondary | ICD-10-CM | POA: Diagnosis not present

## 2014-08-02 DIAGNOSIS — G40909 Epilepsy, unspecified, not intractable, without status epilepticus: Secondary | ICD-10-CM | POA: Diagnosis present

## 2014-08-02 DIAGNOSIS — D869 Sarcoidosis, unspecified: Secondary | ICD-10-CM | POA: Diagnosis present

## 2014-08-02 DIAGNOSIS — I959 Hypotension, unspecified: Secondary | ICD-10-CM | POA: Diagnosis present

## 2014-08-02 DIAGNOSIS — I4891 Unspecified atrial fibrillation: Secondary | ICD-10-CM | POA: Diagnosis present

## 2014-08-02 DIAGNOSIS — I480 Paroxysmal atrial fibrillation: Secondary | ICD-10-CM | POA: Diagnosis present

## 2014-08-02 DIAGNOSIS — K219 Gastro-esophageal reflux disease without esophagitis: Secondary | ICD-10-CM | POA: Diagnosis present

## 2014-08-02 DIAGNOSIS — I951 Orthostatic hypotension: Secondary | ICD-10-CM | POA: Diagnosis present

## 2014-08-02 DIAGNOSIS — J45909 Unspecified asthma, uncomplicated: Secondary | ICD-10-CM | POA: Diagnosis present

## 2014-08-02 DIAGNOSIS — Z7901 Long term (current) use of anticoagulants: Secondary | ICD-10-CM

## 2014-08-02 DIAGNOSIS — Z794 Long term (current) use of insulin: Secondary | ICD-10-CM | POA: Diagnosis not present

## 2014-08-02 DIAGNOSIS — N179 Acute kidney failure, unspecified: Secondary | ICD-10-CM | POA: Diagnosis present

## 2014-08-02 DIAGNOSIS — I2510 Atherosclerotic heart disease of native coronary artery without angina pectoris: Secondary | ICD-10-CM | POA: Diagnosis present

## 2014-08-02 DIAGNOSIS — E785 Hyperlipidemia, unspecified: Secondary | ICD-10-CM | POA: Insufficient documentation

## 2014-08-02 DIAGNOSIS — K589 Irritable bowel syndrome without diarrhea: Secondary | ICD-10-CM | POA: Diagnosis present

## 2014-08-02 DIAGNOSIS — R55 Syncope and collapse: Secondary | ICD-10-CM | POA: Diagnosis present

## 2014-08-02 DIAGNOSIS — G629 Polyneuropathy, unspecified: Secondary | ICD-10-CM | POA: Diagnosis present

## 2014-08-02 DIAGNOSIS — N390 Urinary tract infection, site not specified: Principal | ICD-10-CM | POA: Diagnosis present

## 2014-08-02 DIAGNOSIS — I517 Cardiomegaly: Secondary | ICD-10-CM | POA: Diagnosis present

## 2014-08-02 DIAGNOSIS — I129 Hypertensive chronic kidney disease with stage 1 through stage 4 chronic kidney disease, or unspecified chronic kidney disease: Secondary | ICD-10-CM | POA: Diagnosis present

## 2014-08-02 DIAGNOSIS — I878 Other specified disorders of veins: Secondary | ICD-10-CM | POA: Diagnosis present

## 2014-08-02 DIAGNOSIS — I252 Old myocardial infarction: Secondary | ICD-10-CM | POA: Diagnosis not present

## 2014-08-02 DIAGNOSIS — E860 Dehydration: Secondary | ICD-10-CM | POA: Diagnosis present

## 2014-08-02 DIAGNOSIS — G8929 Other chronic pain: Secondary | ICD-10-CM | POA: Diagnosis present

## 2014-08-02 DIAGNOSIS — R531 Weakness: Secondary | ICD-10-CM

## 2014-08-02 DIAGNOSIS — D860 Sarcoidosis of lung: Secondary | ICD-10-CM | POA: Diagnosis present

## 2014-08-02 DIAGNOSIS — E119 Type 2 diabetes mellitus without complications: Secondary | ICD-10-CM | POA: Diagnosis present

## 2014-08-02 LAB — GLUCOSE, CAPILLARY
Glucose-Capillary: 98 mg/dL (ref 70–99)
Glucose-Capillary: 159 mg/dL — ABNORMAL HIGH (ref 70–99)

## 2014-08-02 LAB — CBC
HEMATOCRIT: 39.6 % (ref 36.0–46.0)
HEMOGLOBIN: 12.9 g/dL (ref 12.0–15.0)
MCH: 28.7 pg (ref 26.0–34.0)
MCHC: 32.6 g/dL (ref 30.0–36.0)
MCV: 88 fL (ref 78.0–100.0)
Platelets: 193 10*3/uL (ref 150–400)
RBC: 4.5 MIL/uL (ref 3.87–5.11)
RDW: 13 % (ref 11.5–15.5)
WBC: 4.7 10*3/uL (ref 4.0–10.5)

## 2014-08-02 LAB — BASIC METABOLIC PANEL
Anion gap: 13 (ref 5–15)
BUN: 17 mg/dL (ref 6–23)
CO2: 25 mEq/L (ref 19–32)
CREATININE: 1.59 mg/dL — AB (ref 0.50–1.10)
Calcium: 9.1 mg/dL (ref 8.4–10.5)
Chloride: 102 mEq/L (ref 96–112)
GFR calc non Af Amer: 34 mL/min — ABNORMAL LOW (ref >90)
GFR, EST AFRICAN AMERICAN: 39 mL/min — AB (ref >90)
GLUCOSE: 177 mg/dL — AB (ref 70–99)
POTASSIUM: 4.6 meq/L (ref 3.7–5.3)
Sodium: 140 mEq/L (ref 137–147)

## 2014-08-02 LAB — TROPONIN I: Troponin I: <0.3 ng/mL (ref <0.30)

## 2014-08-02 LAB — PRO B NATRIURETIC PEPTIDE: Pro B Natriuretic peptide (BNP): 617.3 pg/mL — ABNORMAL HIGH (ref 0–125)

## 2014-08-02 LAB — URINALYSIS, ROUTINE W REFLEX MICROSCOPIC
Glucose, UA: NEGATIVE mg/dL
Hgb urine dipstick: NEGATIVE
Ketones, ur: 15 mg/dL — AB
Nitrite: NEGATIVE
Protein, ur: NEGATIVE mg/dL
Specific Gravity, Urine: 1.03 (ref 1.005–1.030)
Urobilinogen, UA: 0.2 mg/dL (ref 0.0–1.0)
pH: 5 (ref 5.0–8.0)

## 2014-08-02 LAB — I-STAT CG4 LACTIC ACID, ED: LACTIC ACID, VENOUS: 2.78 mmol/L — AB (ref 0.5–2.2)

## 2014-08-02 LAB — URINE MICROSCOPIC-ADD ON

## 2014-08-02 LAB — TSH: TSH: 1.14 u[IU]/mL (ref 0.350–4.500)

## 2014-08-02 MED ORDER — ACETAMINOPHEN 325 MG PO TABS: 650.0000 mg | ORAL_TABLET | Freq: Four times a day (QID) | ORAL | Status: DC | PRN

## 2014-08-02 MED ORDER — ONDANSETRON HCL 4 MG/2ML IJ SOLN: 4.0000 mg | Freq: Four times a day (QID) | INTRAMUSCULAR | Status: DC | PRN

## 2014-08-02 MED ORDER — SODIUM CHLORIDE 0.9 % IV SOLN
250.0000 mL | INTRAVENOUS | Status: DC | PRN
Start: 2014-08-02 — End: 2014-08-04

## 2014-08-02 MED ORDER — IOHEXOL 350 MG/ML SOLN
100.0000 mL | Freq: Once | INTRAVENOUS | Status: AC | PRN
  Administered 2014-08-02: 100 mL via INTRAVENOUS

## 2014-08-02 MED ORDER — SODIUM CHLORIDE 0.9 % IJ SOLN
3.0000 mL | Freq: Two times a day (BID) | INTRAMUSCULAR | Status: DC
  Administered 2014-08-02 – 2014-08-03 (×3): 3 mL via INTRAVENOUS

## 2014-08-02 MED ORDER — CEFTRIAXONE SODIUM 1 G IJ SOLR
1.0000 g | Freq: Once | INTRAMUSCULAR | Status: AC
  Administered 2014-08-02: 1 g via INTRAVENOUS
  Filled 2014-08-02: qty 10

## 2014-08-02 MED ORDER — INSULIN ASPART 100 UNIT/ML ~~LOC~~ SOLN
0.0000 [IU] | Freq: Three times a day (TID) | SUBCUTANEOUS | Status: DC
  Administered 2014-08-03: 2 [IU] via SUBCUTANEOUS
  Administered 2014-08-03: 3 [IU] via SUBCUTANEOUS
  Administered 2014-08-03: 2 [IU] via SUBCUTANEOUS
  Administered 2014-08-04: 3 [IU] via SUBCUTANEOUS

## 2014-08-02 MED ORDER — LEVETIRACETAM ER 500 MG PO TB24
2000.0000 mg | ORAL_TABLET | Freq: Every day | ORAL | Status: DC
  Administered 2014-08-02 – 2014-08-03 (×2): 2000 mg via ORAL
  Filled 2014-08-02 (×4): qty 4

## 2014-08-02 MED ORDER — RIVAROXABAN 20 MG PO TABS
20.0000 mg | ORAL_TABLET | Freq: Every day | ORAL | Status: DC
  Administered 2014-08-02 – 2014-08-03 (×2): 20 mg via ORAL
  Filled 2014-08-02 (×3): qty 1

## 2014-08-02 MED ORDER — DULOXETINE HCL 60 MG PO CPEP
60.0000 mg | ORAL_CAPSULE | Freq: Every day | ORAL | Status: DC
  Administered 2014-08-02 – 2014-08-04 (×3): 60 mg via ORAL
  Filled 2014-08-02 (×3): qty 1

## 2014-08-02 MED ORDER — OXYCODONE-ACETAMINOPHEN 5-325 MG PO TABS
1.0000 | ORAL_TABLET | Freq: Four times a day (QID) | ORAL | Status: DC
  Administered 2014-08-02 – 2014-08-04 (×8): 1 via ORAL
  Filled 2014-08-02 (×8): qty 1

## 2014-08-02 MED ORDER — ONDANSETRON HCL 4 MG PO TABS: 4.0000 mg | ORAL_TABLET | Freq: Four times a day (QID) | ORAL | Status: DC | PRN

## 2014-08-02 MED ORDER — OXYCODONE HCL 5 MG PO TABS: 5.0000 mg | ORAL_TABLET | Freq: Four times a day (QID) | ORAL | Status: DC

## 2014-08-02 MED ORDER — SODIUM CHLORIDE 0.9 % IJ SOLN: 3.0000 mL | INTRAMUSCULAR | Status: DC | PRN

## 2014-08-02 MED ORDER — INSULIN GLARGINE 100 UNIT/ML ~~LOC~~ SOLN
20.0000 [IU] | Freq: Every day | SUBCUTANEOUS | Status: DC
  Administered 2014-08-02 – 2014-08-03 (×2): 20 [IU] via SUBCUTANEOUS
  Filled 2014-08-02 (×3): qty 0.2

## 2014-08-02 MED ORDER — DOFETILIDE 250 MCG PO CAPS
250.0000 ug | ORAL_CAPSULE | Freq: Two times a day (BID) | ORAL | Status: DC
  Administered 2014-08-02 – 2014-08-04 (×5): 250 ug via ORAL
  Filled 2014-08-02 (×6): qty 1

## 2014-08-02 MED ORDER — PANTOPRAZOLE SODIUM 40 MG PO TBEC
40.0000 mg | DELAYED_RELEASE_TABLET | Freq: Two times a day (BID) | ORAL | Status: DC
Start: 2014-08-02 — End: 2014-08-04
  Administered 2014-08-02 – 2014-08-04 (×4): 40 mg via ORAL
  Filled 2014-08-02 (×3): qty 1

## 2014-08-02 MED ORDER — SODIUM CHLORIDE 0.9 % IJ SOLN: 3.0000 mL | Freq: Two times a day (BID) | INTRAMUSCULAR | Status: DC

## 2014-08-02 MED ORDER — IOHEXOL 300 MG/ML  SOLN
25.0000 mL | Freq: Once | INTRAMUSCULAR | Status: AC | PRN
  Administered 2014-08-02: 25 mL via ORAL

## 2014-08-02 MED ORDER — OXYCODONE HCL 5 MG PO TABS
5.0000 mg | ORAL_TABLET | Freq: Four times a day (QID) | ORAL | Status: DC
  Administered 2014-08-02 – 2014-08-04 (×8): 5 mg via ORAL
  Filled 2014-08-02 (×8): qty 1

## 2014-08-02 MED ORDER — OXYCODONE-ACETAMINOPHEN 5-325 MG PO TABS: 1.0000 | ORAL_TABLET | Freq: Four times a day (QID) | ORAL | Status: DC

## 2014-08-02 MED ORDER — ATORVASTATIN CALCIUM 10 MG PO TABS
10.0000 mg | ORAL_TABLET | Freq: Every day | ORAL | Status: DC
  Administered 2014-08-02 – 2014-08-04 (×3): 10 mg via ORAL
  Filled 2014-08-02 (×3): qty 1

## 2014-08-02 MED ORDER — PREGABALIN 50 MG PO CAPS
100.0000 mg | ORAL_CAPSULE | Freq: Three times a day (TID) | ORAL | Status: DC
  Administered 2014-08-02 – 2014-08-04 (×6): 100 mg via ORAL
  Filled 2014-08-02 (×6): qty 2

## 2014-08-02 MED ORDER — CEFTRIAXONE SODIUM IN DEXTROSE 20 MG/ML IV SOLN
1.0000 g | INTRAVENOUS | Status: DC
  Administered 2014-08-03 – 2014-08-04 (×2): 1 g via INTRAVENOUS
  Filled 2014-08-02 (×2): qty 50

## 2014-08-02 MED ORDER — OXYCODONE-ACETAMINOPHEN 10-325 MG PO TABS: 1.0000 | ORAL_TABLET | Freq: Four times a day (QID) | ORAL | Status: DC

## 2014-08-02 MED ORDER — MOMETASONE FURO-FORMOTEROL FUM 200-5 MCG/ACT IN AERO
2.0000 | INHALATION_SPRAY | Freq: Two times a day (BID) | RESPIRATORY_TRACT | Status: DC
  Administered 2014-08-02 – 2014-08-04 (×4): 2 via RESPIRATORY_TRACT
  Filled 2014-08-02: qty 8.8

## 2014-08-02 MED ORDER — ACETAMINOPHEN 650 MG RE SUPP: 650.0000 mg | Freq: Four times a day (QID) | RECTAL | Status: DC | PRN

## 2014-08-02 MED ORDER — MORPHINE SULFATE 4 MG/ML IJ SOLN
4.0000 mg | Freq: Once | INTRAMUSCULAR | Status: AC
  Administered 2014-08-02: 4 mg via INTRAVENOUS
  Filled 2014-08-02: qty 1

## 2014-08-02 NOTE — ED Notes (Signed)
Attempted to call report

## 2014-08-02 NOTE — ED Provider Notes (Signed)
CSN: [id number]     Arrival date & time 08/02/14  0424 History   First MD Initiated Contact with Patient 08/02/14 [phone]     Chief Complaint  Patient presents with  . Chest Pain  . Hypotension     (Consider location/radiation/quality/duration/timing/severity/associated sxs/prior Treatment) HPI Patient presents to the emergency department with chest pain and hypotension.  The patient states that she started feeling weak last night and dizziness.  She states when she stood up she fell.  She felt like she was going to pass out and she took her blood pressure and it was low.  Patient, states she has never had this happen before.  Patient also states that she has had no recent changes in her medicines.  The patient denies chest pain, shortness of breath, nausea, vomiting, diarrhea, headache, blurred vision, back pain, neck pain, fever, cough, abdominal pain, rash, or syncope.  The patient states that he felt she did not pass out when she stood up Past Medical History  Diagnosis Date  . Hypertension   . Hyperlipidemia   . LVH (left ventricular hypertrophy)   . Colon polyps   . Diverticulosis   . Gallstones   . IBS (irritable bowel syndrome)   . Kidney stones   . Anginal pain     OCT 2013  . Paroxysmal a-fib     coumadin ( Chads2=2) Echo 09/05/10 EF 55-60%; mod LVH  . Depression   . Asthma     DR Sherene SiresWERT   . Sarcoidosis of lung     NONACTIVE      SEES DR NFAOWERT  . Fibromyalgia   . Peripheral neuropathy   . Myocardial infarction 2011  . Type II diabetes mellitus   . H/O hiatal hernia   . GERD (gastroesophageal reflux disease)   . Migraine     "@ least once/month; sometimes twice" (01/08/2014)  . Epilepsy   . Arthritis     "joints" (01/08/2014)  . Chronic back pain   . Anxiety    Past Surgical History  Procedure Laterality Date  . Closed reduction ankle fracture Left 10/2006  . Resection of hand neuroma Left 01/2002    'wrist"  . Neuroplasty / transposition median nerve at carpal  tunnel Left 2004  . Esophageal manometry  03/21/2012    Procedure: ESOPHAGEAL MANOMETRY (EM);  Surgeon: Rachael Feeaniel P Jacobs, MD;  Location: [facility];  Service: Endoscopy;  Laterality: N/A;  . Colon surgery    . Anterior cervical decomp/discectomy fusion  07/11/2012    Procedure: ANTERIOR CERVICAL DECOMPRESSION/DISCECTOMY FUSION 1 LEVEL;  Surgeon: Cristi LoronJeffrey D Jenkins, MD;  Location: [facility];  Service: Neurosurgery;  Laterality: N/A;  Cervical Five-Six Anterior Cervical Decompression with Fusion Interbody Prothesis Plating and Bonegraft  . Cholecystectomy  1976  . Neuroplasty / transposition median nerve at carpal tunnel Right 2002  . Abdominal hysterectomy  1995    "partial"  . Salpingoophorectomy Bilateral 2000  . Dilation and curettage of uterus    . Tubal ligation  1982  . Fracture surgery    . Cardiac catheterization  ?2014   Family History  Problem Relation Age of Onset  . Heart attack Mother     @ age 62  . Mental illness Mother   . Diabetes Mother   . Hypertension Mother     siblings  . Heart attack Brother 50  . Colon cancer Maternal Aunt   . Prostate cancer Maternal Grandfather   . Ovarian cancer Maternal Aunt   . Diabetes    .  Lupus Sister   . Ovarian cancer Cousin   . Hypertension     History  Substance Use Topics  . Smoking status: Never Smoker   . Smokeless tobacco: Never Used  . Alcohol Use: No   OB History    No data available     Review of Systems  All other systems negative except as documented in the HPI. All pertinent positives and negatives as reviewed in the HPI.  Allergies  Amitriptyline; Hydromorphone hcl; and Prednisone  Home Medications   Prior to Admission medications   Medication Sig Start Date End Date Taking? Authorizing Provider  albuterol (PROVENTIL HFA;VENTOLIN HFA) 108 (90 BASE) MCG/ACT inhaler Inhale 2 puffs into the lungs every 4 (four) hours as needed for wheezing or shortness of breath. 10/19/11   Tammy S Parrett, NP   atorvastatin (LIPITOR) 10 MG tablet Take 10 mg by mouth daily.    Historical Provider, MD  BYETTA 10 MCG PEN 10 MCG/0.04ML SOPN injection Inject 10 Units into the skin daily. 06/07/14   Historical Provider, MD  calcium carbonate (OS-CAL) 600 MG TABS Take 1 tablet (600 mg total) by mouth daily. 10/19/11   Tammy S Parrett, NP  carvedilol (COREG) 12.5 MG tablet Take 12.5-18.75 mg by mouth 2 (two) times daily with a meal. Takes 1.5 tablets every morning and takes 1 tablets every evening.    Historical Provider, MD  cholecalciferol (VITAMIN D) 1000 UNITS tablet Take 1,000 Units by mouth 2 (two) times daily.     Historical Provider, MD  Cranberry 500 MG CAPS Take 500 mg by mouth 2 (two) times daily.    Historical Provider, MD  diltiazem (CARDIZEM) 60 MG tablet Take 1 tablet (60 mg total) by mouth 2 (two) times daily. 01/08/14   Jodelle Gross, NP  docusate sodium (COLACE) 100 MG capsule Take 100 mg by mouth 2 (two) times daily as needed for constipation. For stool softner    Historical Provider, MD  dofetilide ([name]) 250 MCG capsule Take 1 capsule (250 mcg total) by mouth 2 (two) times daily. 03/07/14   Wendall Stade, MD  doxazosin (CARDURA) 4 MG tablet Take 4 mg by mouth daily.    Historical Provider, MD  DULERA 200-5 MCG/ACT AERO Inhale 2 puffs into the lungs 2 (two) times daily. 06/07/14   Historical Provider, MD  DULoxetine (CYMBALTA) 60 MG capsule Take 60 mg by mouth daily.    Historical Provider, MD  ferrous sulfate 325 (65 FE) MG tablet Take 325 mg by mouth daily with breakfast.    Historical Provider, MD  insulin glargine (LANTUS) 100 UNIT/ML injection Inject 40 Units into the skin at bedtime. 10/19/11   Tammy S Parrett, NP  LANTUS SOLOSTAR 100 UNIT/ML Solostar Pen Inject 40 Units into the skin at bedtime. 06/07/14   Historical Provider, MD  levETIRAcetam (KEPPRA XR) 500 MG 24 hr tablet Take 4 tablets (2,000 mg total) by mouth at bedtime. 04/30/14   Suanne Marker, MD  loratadine (CLARITIN)  10 MG tablet Take 10 mg by mouth every morning.    Historical Provider, MD  metFORMIN (GLUCOPHAGE) 500 MG tablet Take 500 mg by mouth 2 (two) times daily with a meal.    Historical Provider, MD  NEXIUM 40 MG capsule take 1 capsule by mouth twice a day WITH MEALS    [name], MD  niacin (NIASPAN) 500 MG CR tablet Take 500 mg by mouth at bedtime.     Historical Provider, MD  nitroGLYCERIN (NITROSTAT) 0.4  MG SL tablet Place 0.4 mg under the tongue every 5 (five) minutes as needed for chest pain.    Historical Provider, MD  ondansetron (ZOFRAN-ODT) 8 MG disintegrating tablet Take 8 mg by mouth every 6 (six) hours as needed for nausea.    Historical Provider, MD  oxyCODONE-acetaminophen (PERCOCET) 10-325 MG per tablet Take 1 tablet by mouth every 4 (four) hours as needed for pain.    Historical Provider, MD  potassium chloride (K-DUR) 10 MEQ tablet Take 1 tablet (10 mEq total) by mouth daily. 06/17/14   Wendall StadePeter C Nishan, MD  pregabalin (LYRICA) 100 MG capsule Take 100 mg by mouth 3 (three) times daily.    Historical Provider, MD  promethazine (PHENERGAN) 25 MG tablet Take 1 tablet (25 mg total) by mouth every 6 (six) hours as needed for nausea. 11/27/12   Jamesetta Orleanshristopher W Isley Zinni, PA-C  rivaroxaban (XARELTO) 20 MG TABS tablet Take 1 tablet (20 mg total) by mouth daily with supper. 01/08/14   Jodelle GrossKathryn M Lawrence, NP  rizatriptan (MAXALT) 10 MG tablet Take 10 mg by mouth daily as needed for migraine. For migraines. May repeat in 2 hours if needed.    Historical Provider, MD  vitamin C (ASCORBIC ACID) 500 MG tablet Take 500 mg by mouth 2 (two) times daily.    Historical Provider, MD   BP 108/52 mmHg  Pulse 56  Temp(Src) 97.7 F (36.5 C) (Oral)  Resp 19  Ht 5\' 6"  (1.676 m)  Wt 185 lb (83.915 kg)  BMI 29.87 kg/m2  SpO2 100% Physical Exam  Constitutional: She is oriented to person, place, and time. She appears well-developed and well-nourished. No distress.  HENT:  Head: Normocephalic and atraumatic.   Mouth/Throat: Oropharynx is clear and moist.  Eyes: Pupils are equal, round, and reactive to light.  Neck: Normal range of motion. Neck supple.  Cardiovascular: Normal rate, regular rhythm and normal heart sounds.  Exam reveals no gallop and no friction rub.   No murmur heard. Pulmonary/Chest: Effort normal and breath sounds normal. No respiratory distress.  Abdominal: Soft. Bowel sounds are normal. She exhibits no distension. There is no tenderness.  Musculoskeletal: She exhibits no edema.  Neurological: She is alert and oriented to person, place, and time. She exhibits normal muscle tone. Coordination normal.  Skin: Skin is warm and dry. No rash noted. No erythema.  Nursing note and vitals reviewed.   ED Course  Procedures (including critical care time) Labs Review Labs Reviewed  BASIC METABOLIC PANEL - Abnormal; Notable for the following:    Glucose, Bld 177 (*)    Creatinine, Ser 1.59 (*)    GFR calc non Af Amer 34 (*)    GFR calc Af Amer 39 (*)    All other components within normal limits  I-STAT CG4 LACTIC ACID, ED - Abnormal; Notable for the following:    Lactic Acid, Venous 2.78 (*)    All other components within normal limits  CBC  TROPONIN I    Imaging Review Dg Chest Port 1 View  08/02/2014   CLINICAL DATA:  Chest pain and hypotension  EXAM: PORTABLE CHEST - 1 VIEW  COMPARISON:  01/06/2014  FINDINGS: No cardiomegaly for technique.  Negative aortic and hilar contours.  There is low lung volumes with interstitial crowding. There is no edema, consolidation, effusion, or pneumothorax. No osseous findings to explain chest pain.  IMPRESSION: Negative low volume chest.   Electronically Signed   By: Tiburcio PeaJonathan  Watts M.D.   On: 08/02/2014 06:11  EKG Interpretation   Date/Time:  Thursday August 02 2014 04:30:31 EST Ventricular Rate:  54 PR Interval:  201 QRS Duration: 90 QT Interval:  528 QTC Calculation: 500 R Axis:   1 Text Interpretation:  Sinus rhythm RSR' in  V1 or V2, right VCD or RVH  Nonspecific T abnormalities, lateral leads Prolonged QT interval No acute  changes Confirmed by Rhunette Croft, MD, Janey Genta [phone]) on 08/02/2014 5:35:01 AM     patient be admitted to the hospital for hypotension and further evaluation.  Told of the need for admission  MDM   Final diagnoses:  None        Carlyle Dolly, PA-C 08/07/14 [id number]  Mirian Mo, MD 08/12/14 [phone]

## 2014-08-02 NOTE — ED Notes (Addendum)
[place], EMT informed this RN that the pt's blood pressure was low. This RN placed the pt in trendelenburg, and started a liter bolus.

## 2014-08-02 NOTE — ED Notes (Signed)
Pt started having left sided chest tightness around 1600 yesterday evening. EMS called out at 0340 for chest pain, diaphoresis and nausea. Upon EMS arrival, pt's BP was 80/38, and she was satting 89% on RA. Pt placed in trendelenburg and 2L of O2. Pt given 4mg  of zofran with relief. Pt did not take ASA, as her doctor took her off ASA and placed her on Xarelto. Pt given 400mL of fluid prior to arrival. CBG 189.

## 2014-08-02 NOTE — ED Notes (Signed)
Pt returned from CT °

## 2014-08-02 NOTE — ED Notes (Signed)
Patient transported to CT 

## 2014-08-02 NOTE — H&P (Addendum)
[facility] History and Physical  [name] IOE:[id number] DOB: September 10, 1950 DOA: 08/02/2014   PCP: Elyn Peers, MD  Specialists: Patient is followed by Dr. Leonides Sake with cardiology. Dr. Leta Baptist with neurology.  Chief Complaint: Passing out spell  HPI: [name] is a 63 y.o. female with a past medical history of diabetes mellitus on insulin, atrial fibrillation on anticoagulation, seizure disorder, history of orthostatic hypotension with previous episodes of syncope was in her usual state of health until last night around midnight when she got up from her bed to go to the kitchen. She felt lightheaded. She felt everything blacking out. She had to sit down. And she states that she may have passed out at that time. Denies any falls or injuries. She got up, still felt dizzy. She checked her blood pressure and apparently was 58/36. She called her brother who called 911. She felt nauseated. She was sweating. She had a headache. She also had some chest pain, shortness of breath and she felt as if her right side was weaker than normal. Oxygen levels were checked by EMS, which is 87%. She denies any trouble urinating per se. Denies any medication changes recently. No bleeding that she has noticed recently. Denies any chest pain currently. Still feels as if her right side is slightly weaker. She does have a history of neuropathy and nerve entrapment in the past.  Home Medications: Prior to Admission medications   Medication Sig Start Date End Date Taking? Authorizing Provider  albuterol (PROVENTIL HFA;VENTOLIN HFA) 108 (90 BASE) MCG/ACT inhaler Inhale 2 puffs into the lungs every 4 (four) hours as needed for wheezing or shortness of breath. 10/19/11  Yes Tammy S Parrett, NP  atorvastatin (LIPITOR) 10 MG tablet Take 10 mg by mouth daily.   Yes Historical Provider, MD  BYETTA 10 MCG PEN 10 MCG/0.04ML SOPN injection Inject 10 Units into the skin daily. 06/07/14  Yes Historical Provider,  MD  calcium carbonate (OS-CAL) 600 MG TABS Take 1 tablet (600 mg total) by mouth daily. 10/19/11  Yes Tammy S Parrett, NP  carvedilol (COREG) 12.5 MG tablet Take 18.75 mg by mouth 2 (two) times daily with a meal. Takes 1.5 tablets .   Yes Historical Provider, MD  cholecalciferol (VITAMIN D) 1000 UNITS tablet Take 1,000 Units by mouth 2 (two) times daily.    Yes Historical Provider, MD  Cranberry 500 MG CAPS Take 500 mg by mouth 2 (two) times daily.   Yes Historical Provider, MD  diltiazem (CARDIZEM) 60 MG tablet Take 1 tablet (60 mg total) by mouth 2 (two) times daily. 01/08/14  Yes Lendon Colonel, NP  docusate sodium (COLACE) 100 MG capsule Take 100 mg by mouth 2 (two) times daily as needed for constipation. For stool softner   Yes Historical Provider, MD  dofetilide ([name]) 250 MCG capsule Take 1 capsule (250 mcg total) by mouth 2 (two) times daily. 03/07/14  Yes Josue Hector, MD  doxazosin (CARDURA) 4 MG tablet Take 4 mg by mouth daily.   Yes Historical Provider, MD  DULERA 200-5 MCG/ACT AERO Inhale 2 puffs into the lungs 2 (two) times daily. 06/07/14  Yes Historical Provider, MD  DULoxetine (CYMBALTA) 60 MG capsule Take 60 mg by mouth daily.   Yes Historical Provider, MD  ferrous sulfate 325 (65 FE) MG tablet Take 325 mg by mouth daily with breakfast.   Yes Historical Provider, MD  LANTUS SOLOSTAR 100 UNIT/ML Solostar Pen Inject 40 Units into the skin at bedtime.  06/07/14  Yes Historical Provider, MD  levETIRAcetam (KEPPRA XR) 500 MG 24 hr tablet Take 4 tablets (2,000 mg total) by mouth at bedtime. 04/30/14  Yes Penni Bombard, MD  loratadine (CLARITIN) 10 MG tablet Take 10 mg by mouth every morning.   Yes Historical Provider, MD  metFORMIN (GLUCOPHAGE) 500 MG tablet Take 500 mg by mouth 2 (two) times daily with a meal.   Yes Historical Provider, MD  NEXIUM 40 MG capsule take 1 capsule by mouth twice a day WITH MEALS   Yes Milus Banister, MD  niacin (NIASPAN) 500 MG CR tablet Take 500 mg  by mouth at bedtime.    Yes Historical Provider, MD  oxyCODONE-acetaminophen (PERCOCET) 10-325 MG per tablet Take 1 tablet by mouth every 6 (six) hours. For pain 07/17/14  Yes Historical Provider, MD  potassium chloride (K-DUR) 10 MEQ tablet Take 1 tablet (10 mEq total) by mouth daily. 06/17/14  Yes Josue Hector, MD  pregabalin (LYRICA) 100 MG capsule Take 100 mg by mouth 3 (three) times daily.   Yes Historical Provider, MD  rivaroxaban (XARELTO) 20 MG TABS tablet Take 1 tablet (20 mg total) by mouth daily with supper. 01/08/14  Yes Lendon Colonel, NP  vitamin C (ASCORBIC ACID) 500 MG tablet Take 500 mg by mouth 2 (two) times daily.   Yes Historical Provider, MD  nitroGLYCERIN (NITROSTAT) 0.4 MG SL tablet Place 0.4 mg under the tongue every 5 (five) minutes as needed for chest pain.    Historical Provider, MD  ondansetron (ZOFRAN-ODT) 8 MG disintegrating tablet Take 8 mg by mouth every 6 (six) hours as needed for nausea.    Historical Provider, MD    Allergies:  Allergies  Allergen Reactions  . Amitriptyline Other (See Comments)    Disoriented.  . Hydromorphone Hcl Other (See Comments)    blood pressure drops.  . Prednisone     SENT INTO AFIB     Past Medical History: Past Medical History  Diagnosis Date  . Hypertension   . Hyperlipidemia   . LVH (left ventricular hypertrophy)   . Colon polyps   . Diverticulosis   . Gallstones   . IBS (irritable bowel syndrome)   . Kidney stones   . Anginal pain     OCT 2013  . Paroxysmal a-fib     coumadin ( Chads2=2) Echo 09/05/10 EF 55-60%; mod LVH  . Depression   . Asthma     DR Melvyn Novas   . Sarcoidosis of lung     NONACTIVE      SEES DR VVKP  . Fibromyalgia   . Peripheral neuropathy   . Myocardial infarction 2011  . Type II diabetes mellitus   . H/O hiatal hernia   . GERD (gastroesophageal reflux disease)   . Migraine     "@ least once/month; sometimes twice" (01/08/2014)  . Epilepsy   . Arthritis     "joints" (01/08/2014)  .  Chronic back pain   . Anxiety     Past Surgical History  Procedure Laterality Date  . Closed reduction ankle fracture Left 10/2006  . Resection of hand neuroma Left 01/2002    'wrist"  . Neuroplasty / transposition median nerve at carpal tunnel Left 2004  . Esophageal manometry  03/21/2012    Procedure: ESOPHAGEAL MANOMETRY (EM);  Surgeon: Milus Banister, MD;  Location: [facility];  Service: Endoscopy;  Laterality: N/A;  . Colon surgery    . Anterior cervical decomp/discectomy fusion  07/11/2012  Procedure: ANTERIOR CERVICAL DECOMPRESSION/DISCECTOMY FUSION 1 LEVEL;  Surgeon: Ophelia Charter, MD;  Location: [facility];  Service: Neurosurgery;  Laterality: N/A;  Cervical Five-Six Anterior Cervical Decompression with Fusion Interbody Prothesis Plating and Bonegraft  . Cholecystectomy  1976  . Neuroplasty / transposition median nerve at carpal tunnel Right 2002  . Abdominal hysterectomy  1995    "partial"  . Salpingoophorectomy Bilateral 2000  . Dilation and curettage of uterus    . Tubal ligation  1982  . Fracture surgery    . Cardiac catheterization  ?2014    Social History: She lives in [place] by herself. Denies smoking, alcohol use or illicit drug use. Uses a cane occasionally.  Family History:  Family History  Problem Relation Age of Onset  . Heart attack Mother     @ age 62  . Mental illness Mother   . Diabetes Mother   . Hypertension Mother     siblings  . Heart attack Brother 63  . Colon cancer Maternal Aunt   . Prostate cancer Maternal Grandfather   . Ovarian cancer Maternal Aunt   . Diabetes    . Lupus Sister   . Ovarian cancer Cousin   . Hypertension       Review of Systems - History obtained from the patient General ROS: positive for  - fatigue Psychological ROS: negative Ophthalmic ROS: negative ENT ROS: negative Allergy and Immunology ROS: negative Hematological and Lymphatic ROS: negative Endocrine ROS: negative Respiratory ROS: as in  hpi Cardiovascular ROS: as in hpi Gastrointestinal ROS: Slight right-sided abdominal discomfort. Genito-Urinary ROS: no dysuria, trouble voiding, or hematuria Musculoskeletal ROS: negative Neurological ROS: as in hpi Dermatological ROS: negative  Physical Examination  Filed Vitals:   08/02/14 1230 08/02/14 1245 08/02/14 1300 08/02/14 1315  BP: 129/59 123/75 106/55 128/64  Pulse: 56 57  64  Temp:      TempSrc:      Resp: 15 16 16    Height:      Weight:      SpO2: 97% 99%  95%    BP 128/64 mmHg  Pulse 64  Temp(Src) 97.7 F (36.5 C) (Oral)  Resp 16  Ht 5' 6"  (1.676 m)  Wt 83.915 kg (185 lb)  BMI 29.87 kg/m2  SpO2 95%  General appearance: alert, cooperative, appears stated age and no distress Head: Normocephalic, without obvious abnormality, atraumatic Eyes: conjunctivae/corneas clear. PERRL, EOM's intact.  Throat: lips, mucosa, and tongue normal; teeth and gums normal Neck: no adenopathy, no carotid bruit, no JVD, supple, symmetrical, trachea midline and thyroid not enlarged, symmetric, no tenderness/mass/nodules Resp: clear to auscultation bilaterally Cardio: regular rate and rhythm, S1, S2 normal, no murmur, click, rub or gallop GI: Mild tenderness in the right side without any rebound, rigidity or guarding. No masses or organomegaly. Bowel sounds are present. Extremities: extremities normal, atraumatic, no cyanosis or edema Pulses: 2+ and symmetric Skin: Skin color, texture, turgor normal. No rashes or lesions Lymph nodes: Cervical, supraclavicular, and axillary nodes normal. Neurologic: Alert and oriented 3. Cranial nerves II-12 intact. No obvious deficits noted in the right upper extremity compared to the left. Appears to be 5/5 in all muscle groups. Same with lower extremities as well. Gait not assessed.  Laboratory Data: Results for orders placed or performed during the hospital encounter of 08/02/14 (from the past 48 hour(s))  CBC     Status: None   Collection  Time: 08/02/14  5:54 AM  Result Value Ref Range   WBC 4.7 4.0 -  10.5 K/uL   RBC 4.50 3.87 - 5.11 MIL/uL   Hemoglobin 12.9 12.0 - 15.0 g/dL   HCT 39.6 36.0 - 46.0 %   MCV 88.0 78.0 - 100.0 fL   MCH 28.7 26.0 - 34.0 pg   MCHC 32.6 30.0 - 36.0 g/dL   RDW 13.0 11.5 - 15.5 %   Platelets 193 150 - 400 K/uL  Basic metabolic panel     Status: Abnormal   Collection Time: 08/02/14  5:54 AM  Result Value Ref Range   Sodium 140 137 - 147 mEq/L   Potassium 4.6 3.7 - 5.3 mEq/L   Chloride 102 96 - 112 mEq/L   CO2 25 19 - 32 mEq/L   Glucose, Bld 177 (H) 70 - 99 mg/dL   BUN 17 6 - 23 mg/dL   Creatinine, Ser 1.59 (H) 0.50 - 1.10 mg/dL   Calcium 9.1 8.4 - 10.5 mg/dL   GFR calc non Af Amer 34 (L) >90 mL/min   GFR calc Af Amer 39 (L) >90 mL/min    Comment: (NOTE) The eGFR has been calculated using the CKD EPI equation. This calculation has not been validated in all clinical situations. eGFR's persistently <90 mL/min signify possible Chronic Kidney Disease.    Anion gap 13 5 - 15  Troponin I (MHP)     Status: None   Collection Time: 08/02/14  5:54 AM  Result Value Ref Range   Troponin I <0.30 <0.30 ng/mL    Comment:        Due to the release kinetics of cTnI, a negative result within the first hours of the onset of symptoms does not rule out myocardial infarction with certainty. If myocardial infarction is still suspected, repeat the test at appropriate intervals.   Pro b natriuretic peptide (BNP)     Status: Abnormal   Collection Time: 08/02/14  6:17 AM  Result Value Ref Range   Pro B Natriuretic peptide (BNP) 617.3 (H) 0 - 125 pg/mL  I-Stat CG4 Lactic Acid, ED     Status: Abnormal   Collection Time: 08/02/14  6:22 AM  Result Value Ref Range   Lactic Acid, Venous 2.78 (H) 0.5 - 2.2 mmol/L  Urinalysis, Routine w reflex microscopic     Status: Abnormal   Collection Time: 08/02/14  8:07 AM  Result Value Ref Range   Color, Urine YELLOW YELLOW   APPearance CLOUDY (A) CLEAR   Specific  Gravity, Urine 1.030 1.005 - 1.030   pH 5.0 5.0 - 8.0   Glucose, UA NEGATIVE NEGATIVE mg/dL   Hgb urine dipstick NEGATIVE NEGATIVE   Bilirubin Urine SMALL (A) NEGATIVE   Ketones, ur 15 (A) NEGATIVE mg/dL   Protein, ur NEGATIVE NEGATIVE mg/dL   Urobilinogen, UA 0.2 0.0 - 1.0 mg/dL   Nitrite NEGATIVE NEGATIVE   Leukocytes, UA MODERATE (A) NEGATIVE  Urine microscopic-add on     Status: Abnormal   Collection Time: 08/02/14  8:07 AM  Result Value Ref Range   Squamous Epithelial / LPF MANY (A) RARE   WBC, UA 21-50 <3 WBC/hpf   Bacteria, UA MANY (A) RARE   Casts HYALINE CASTS (A) NEGATIVE   Urine-Other MUCOUS PRESENT     Radiology Reports: Ct Angio Chest Pe W/cm &/or Wo Cm  08/02/2014   CLINICAL DATA:  Chest pain starting 4 p.m. yesterday, lower abdominal pain, back pain  EXAM: CT ANGIOGRAPHY CHEST WITH CONTRAST  TECHNIQUE: Multidetector CT imaging of the chest was performed using the standard protocol during bolus  administration of intravenous contrast. Multiplanar CT image reconstructions and MIPs were obtained to evaluate the vascular anatomy.  CONTRAST:  14m OMNIPAQUE IOHEXOL 350 MG/ML SOLN  COMPARISON:  None.  FINDINGS: Sagittal images of the thoracic spine are unremarkable. Sagittal view of the sternum is unremarkable. Central airways are patent.  The study is of excellent technical quality. There is no evidence of pulmonary embolus.  A left anterior mediastinal lymph node just lateral to aorta measures 1.9 by 1.1 cm. Right hilar lymph node measures 1.4 by 1.6 cm. Left hilar lymph node measures 1.9 by 1.4 cm. A precarinal lymph node measures 1.3 x 1.6 cm.  There is no aortic aneurysm. Atherosclerotic calcifications of coronary arteries are noted. No destructive bony lesions are noted.  Images of the lung parenchyma shows no acute infiltrate or pulmonary edema.  Review of the MIP images confirms the above findings.  No axillary adenopathy.  IMPRESSION: 1. No pulmonary embolus is noted. 2.  There is mediastinal and bilateral hilar adenopathy. Although this may be reactive lymphoproliferative disease cannot be excluded. Follow-up examination in 3 months is recommended to assure stability or resolution. 3. No acute infiltrate or pulmonary edema. 4. Atherosclerotic calcifications of coronary arteries. 5. No destructive bony lesions are noted.   Electronically Signed   By: LLahoma CrockerM.D.   On: 08/02/2014 12:52   Ct Abdomen Pelvis W Contrast  08/02/2014   CLINICAL DATA:  Chest pain, lower abdominal pain, back pain  EXAM: CT ABDOMEN AND PELVIS WITH CONTRAST  TECHNIQUE: Multidetector CT imaging of the abdomen and pelvis was performed using the standard protocol following bolus administration of intravenous contrast.  CONTRAST:  1074mOMNIPAQUE IOHEXOL 350 MG/ML SOLN  COMPARISON:  12/29/2012  FINDINGS: Lung bases are unremarkable. Left hilar lymph node measures 1.5 by 1.3 cm. Right hilar lymph node measures 1.5 by 1.1 cm.  Heart size within normal limits. Enhanced liver shows no biliary ductal dilatation. No focal hepatic mass. No calcified gallstones are noted within gallbladder. Main pancreatic duct measures 3.6 mm in diameter without definite focal pancreatic mass. The the spleen and adrenal glands are unremarkable. Kidneys are symmetrical in size and enhancement. No hydronephrosis or hydroureter. Delayed renal images shows bilateral renal symmetrical excretion. Bilateral visualized proximal ureter is unremarkable. Probable cyst in upper pole of the left kidney measures 9 mm stable from prior exam.  No small bowel obstruction. Normal appendix. No pericecal inflammation. The terminal ileum is unremarkable. Oral contrast material was given to the patient. No aortic aneurysm.  Some stool noted in descending colon and rectosigmoid colon. No distal colonic obstruction. The patient is status post hysterectomy. The urinary bladder is unremarkable. Pelvic phleboliths are stable.  IMPRESSION: 1. No acute  inflammatory process within abdomen or pelvis. 2. No hydronephrosis or hydroureter. Stable probable cyst in upper pole of the left kidney. 3. There is mild prominent size main pancreatic duct measures 3.6 mm. No focal pancreatic mass is noted. No peripancreatic fluid. 4. Normal appendix.  No pericecal inflammation. 5. There is mild bilateral hilar adenopathy. This may be reactive in nature. Follow-up examination she should be based on clinical grounds.   Electronically Signed   By: LiLahoma Crocker.D.   On: 08/02/2014 12:32   Dg Chest Port 1 View  08/02/2014   CLINICAL DATA:  Chest pain and hypotension  EXAM: PORTABLE CHEST - 1 VIEW  COMPARISON:  01/06/2014  FINDINGS: No cardiomegaly for technique.  Negative aortic and hilar contours.  There is low lung volumes with  interstitial crowding. There is no edema, consolidation, effusion, or pneumothorax. No osseous findings to explain chest pain.  IMPRESSION: Negative low volume chest.   Electronically Signed   By: Jorje Guild M.D.   On: 08/02/2014 06:11    Electrocardiogram: Sinus bradycardia in the 50s. Normal axis. Nonspecific T-wave changes. No concerning ST changes. Similar to previous EKG.  Problem List  Principal Problem:   Hypotension Active Problems:   Sarcoid   Syncope   A-fib   UTI (lower urinary tract infection)   Assessment: This is a 63 year old African-American female presents with the episode of syncope. She was hypotensive. She does have a history of orthostatic hypotension, which could have been the reason for her presentation. She tells me that she feels weak on the right side. I don't appreciate any significant right-sided deficit. Her creatinine is slightly higher than her baseline. It's possible she might have been dehydrated, although no reason for the same is elicited.  Plan: #1 syncope, most likely due to orthostatic hypotension or dehydration. Blood pressure was initially low in the emergency department. With hydration, she  has improved. She does have a history of diastolic CHF, so we will be cautious with fluids. Check orthostatics again. Repeat renal function in the morning.  #2 Possible right-sided weakness: Examination was not that remarkable. However, patient insists that she is feeling weak on that side. She does have a history of neuropathy and no entrapment, which could be contributing. However, we will proceed with MRI brain. PT and OT will be consulted.  #3 history of atrial fibrillation, appears to be paroxysmal: Continue with her home medications and anticoagulation.  #4 Abnormal UA: Appears to be contaminated sample. But since lactic acid was elevated and she had mentioned some symptoms to the ED provider we will treat with ceftriaxone for now. Follow up on urine cultures.  #5 hypotension: Most likely due to orthostatic hypotension. We will hold her BP lowering agents for now. These may be resumed tomorrow morning if she improves.  #6 history of seizure disorder: Continue with Keppra.  #7 History of sarcoidosis: Follow-up with PCP.  #8 Nonspecific changes on the CT abdomen: Would recommend PCP to follow-up on same.  #9 Diabetes mellitus type 2: Sliding scale coverage and Lantus at a lower dose. Check HbA1c.   DVT Prophylaxis: She is on full anticoagulation Code Status: Full Code Family Communication: Discussed with the patient  Disposition Plan: Admit to telemetry   Further management decisions will depend on results of further testing and patient's response to treatment.   [facility]  [facility] Pager [phone]  If 7PM-7AM, please contact night-coverage [email or web address] Password [email or web address]  08/02/2014, 1:55 PM

## 2014-08-03 ENCOUNTER — Inpatient Hospital Stay (HOSPITAL_COMMUNITY): Payer: Medicaid Other

## 2014-08-03 DIAGNOSIS — D869 Sarcoidosis, unspecified: Secondary | ICD-10-CM

## 2014-08-03 DIAGNOSIS — R55 Syncope and collapse: Secondary | ICD-10-CM

## 2014-08-03 DIAGNOSIS — I369 Nonrheumatic tricuspid valve disorder, unspecified: Secondary | ICD-10-CM

## 2014-08-03 DIAGNOSIS — N390 Urinary tract infection, site not specified: Principal | ICD-10-CM

## 2014-08-03 DIAGNOSIS — N179 Acute kidney failure, unspecified: Secondary | ICD-10-CM

## 2014-08-03 LAB — COMPREHENSIVE METABOLIC PANEL
ALK PHOS: 96 U/L (ref 39–117)
ALT: 10 U/L (ref 0–35)
AST: 15 U/L (ref 0–37)
Albumin: 3.8 g/dL (ref 3.5–5.2)
Anion gap: 13 (ref 5–15)
BILIRUBIN TOTAL: 0.5 mg/dL (ref 0.3–1.2)
BUN: 14 mg/dL (ref 6–23)
CHLORIDE: 104 meq/L (ref 96–112)
CO2: 24 meq/L (ref 19–32)
Calcium: 9.1 mg/dL (ref 8.4–10.5)
Creatinine, Ser: 1.19 mg/dL — ABNORMAL HIGH (ref 0.50–1.10)
GFR calc Af Amer: 56 mL/min — ABNORMAL LOW (ref >90)
GFR, EST NON AFRICAN AMERICAN: 48 mL/min — AB (ref >90)
GLUCOSE: 143 mg/dL — AB (ref 70–99)
Potassium: 4.4 mEq/L (ref 3.7–5.3)
SODIUM: 141 meq/L (ref 137–147)
Total Protein: 7.2 g/dL (ref 6.0–8.3)

## 2014-08-03 LAB — CBC
HCT: 36.7 % (ref 36.0–46.0)
Hemoglobin: 11.9 g/dL — ABNORMAL LOW (ref 12.0–15.0)
MCH: 29.4 pg (ref 26.0–34.0)
MCHC: 32.4 g/dL (ref 30.0–36.0)
MCV: 90.6 fL (ref 78.0–100.0)
Platelets: 172 10*3/uL (ref 150–400)
RBC: 4.05 MIL/uL (ref 3.87–5.11)
RDW: 13 % (ref 11.5–15.5)
WBC: 3.6 10*3/uL — ABNORMAL LOW (ref 4.0–10.5)

## 2014-08-03 LAB — URINE CULTURE: Colony Count: >=100000

## 2014-08-03 LAB — GLUCOSE, CAPILLARY
GLUCOSE-CAPILLARY: 134 mg/dL — AB (ref 70–99)
Glucose-Capillary: 139 mg/dL — ABNORMAL HIGH (ref 70–99)
Glucose-Capillary: 181 mg/dL — ABNORMAL HIGH (ref 70–99)
Glucose-Capillary: 137 mg/dL — ABNORMAL HIGH (ref 70–99)

## 2014-08-03 LAB — TROPONIN I: Troponin I: <0.3 ng/mL (ref <0.30)

## 2014-08-03 LAB — HEMOGLOBIN A1C
Hgb A1c MFr Bld: 7.4 % — ABNORMAL HIGH (ref <5.7)
Mean Plasma Glucose: 166 mg/dL — ABNORMAL HIGH (ref <117)

## 2014-08-03 LAB — MAGNESIUM: Magnesium: 2.1 mg/dL (ref 1.5–2.5)

## 2014-08-03 MED ORDER — SODIUM CHLORIDE 0.9 % IV SOLN
INTRAVENOUS | Status: DC
  Administered 2014-08-03 – 2014-08-04 (×2): via INTRAVENOUS

## 2014-08-03 MED ORDER — DOCUSATE SODIUM 100 MG PO CAPS
100.0000 mg | ORAL_CAPSULE | Freq: Once | ORAL | Status: AC
  Administered 2014-08-03: 100 mg via ORAL
  Filled 2014-08-03: qty 1

## 2014-08-03 NOTE — Care Management Note (Addendum)
    Page 1 of 2   08/05/2014     11:43:37 AM CARE MANAGEMENT NOTE 08/05/2014  Patient:  [name], [name]   Account Number:  [id number]  Date Initiated:  08/03/2014  Documentation initiated by:  [name],Toni  Subjective/Objective Assessment:   dx syncope    PCP  [name]     Action/Plan:   discharge planning   Anticipated DC Date:  08/04/2014   Anticipated DC Plan:  [place]  CM consult      [facility] Choice  HOME HEALTH   Choice offered to / List presented to:  [name]   DME arranged  Vassie Moselle      DME agency  [place] arranged  [place].   Status of service:  Completed, signed off Medicare Important Message given?   (If response is "NO", the following Medicare IM given date fields will be blank) Date Medicare IM given:   Medicare IM given by:   Date Additional Medicare IM given:   Additional Medicare IM given by:    Discharge Disposition:  [place]  Per UR Regulation:  Reviewed for med. necessity/level of care/duration of stay  If discussed at [place] of Stay Meetings, dates discussed:    Comments:  08/05/14 11:40 CM received call from [facility] rep, [name] who states pt's HHPT/OT will not be covered by her insurance and with will be out of pocket.  CM called pt who states no thank you and will follow up with her PCP: [name].  No other CM needs were communicated.  [name], BSN, [place] [phone].   08/04/2014  [name] BSN Met patient at bedside role of case manager explained.Patient verbalzies understanding.Demographics verified in EPIC Patient provided with Home health choice list and elects [facility] for [facility],OT.[name] called And a rolling walker was ordered for patient prior to Discharge home.[name] [facility] Contacted to set up Home health Services.Patient verifies undesrstanding  of Home health plan no further CM needs at this time.  08/03/899 Toni Mayo RN MSN BSN  Pt requests assistance with rolling walker - states dtr bought her a standard walker "years ago" but rolling walker is much easier for her to manage.  Discussed with MD who placed order.

## 2014-08-03 NOTE — Progress Notes (Signed)
Utilization Review Completed.Lai Hendriks T12/18/2015  

## 2014-08-03 NOTE — Progress Notes (Signed)
Occupational Therapy Evaluation Patient Details Name: [name] MRN: [id number] DOB: October 07, 1950 Today's Date: 08/03/2014    History of Present Illness [name] is a 63 y.o. femaled admitted 08/02/14 with syncope. PMH: DM, afib on anticoagulants, seizure disorders, hx of OH with previous episodes of syncope, nerve entrapment with multiple surgeries, and peripheral neuropathy.    Clinical Impression   PTA pt lived at home alone and was independent with ADLs, however she reports that she required increased time due to peripheral neuropathy and has had several falls at home as a result of her dizziness. Pt currently requires min (A) for functional mobility and Supervision/Min Guard for transfers due to dizziness. Pt will benefit from acute OT to address safety with ADLs at home.     Follow Up Recommendations  Home health OT;Supervision/Assistance - 24 hour    Equipment Recommendations  Tub/shower bench    Recommendations for Other Services       Precautions / Restrictions Precautions Precautions: Fall Precaution Comments: syncope; dizzy despite stable vitals Restrictions Weight Bearing Restrictions: No      Mobility Bed Mobility Overal bed mobility: Modified Independent                Transfers Overall transfer level: Needs assistance Equipment used: Rolling walker (2 wheeled) Transfers: Sit to/from Stand Sit to Stand: Supervision         General transfer comment: Supervision for safety. Able to use good technique and hand placement.          ADL Overall ADL's : Needs assistance/impaired Eating/Feeding: Independent;Sitting   Grooming: Oral care;Wash/dry hands;Minimal assistance;Standing Grooming Details (indicate cue type and reason): Pt stood at sink for grooming, but required seated rest break due to dizziness. Pt required min (A) for balance in standing. Once seated, pt became nauseous with dry heaving.  Upper Body Bathing: Set up;Sitting    Lower Body Bathing: Min guard;Sit to/from stand   Upper Body Dressing : Set up;Sitting   Lower Body Dressing: Min guard;Sit to/from stand   Toilet Transfer: Minimal assistance;Ambulation;Regular Toilet;Grab bars;RW   Toileting- ArchitectClothing Manipulation and Hygiene: Min guard;Sit to/from stand       Functional mobility during ADLs: Minimal assistance;Rolling walker General ADL Comments: Pt demonstrated good return technique of sit<>stand with sequencing and hand [facility], however requires min (A) to ambulate due to dizziness and balance deficits. Pt able to don socks sitting EOB. Vitals stable during OT session.         Perception Perception Perception Tested?: No   Praxis Praxis Praxis tested?: Within functional limits    Pertinent Vitals/Pain Pain Assessment: No/denies pain     Hand Dominance Right   Extremity/Trunk Assessment Upper Extremity Assessment Upper Extremity Assessment: Overall WFL for tasks assessed   Lower Extremity Assessment Lower Extremity Assessment: Overall WFL for tasks assessed   Cervical / Trunk Assessment Cervical / Trunk Assessment: Normal   Communication Communication Communication: No difficulties   Cognition Arousal/Alertness: Awake/alert (somewhat lethargic likely from meds?) Behavior During Therapy: WFL for tasks assessed/performed Overall Cognitive Status: Within Functional Limits for tasks assessed                                Home Living Family/patient expects to be discharged to:: Private residence Living Arrangements: Alone Available Help at Discharge: Family;Available PRN/intermittently (brother and cousin check in on pt daily) Type of Home: House Home Access: Stairs to enter Entrance  Stairs-Number of Steps: 5 Entrance Stairs-Rails: Right;Left Home Layout: One level     Bathroom Shower/Tub: Tub/shower unit Shower/tub characteristics: Engineer, building servicesCurtain Bathroom Toilet: Standard     Home Equipment:  Bedside commode;Shower seat          Prior Functioning/Environment Level of Independence: Independent        Comments: does not drive due to hx of seizures    OT Diagnosis: Generalized weakness   OT Problem List: Decreased strength;Decreased activity tolerance;Impaired balance (sitting and/or standing);Decreased knowledge of use of DME or AE;Decreased knowledge of precautions   OT Treatment/Interventions: Self-care/ADL training;Therapeutic exercise;Energy conservation;DME and/or AE instruction;Therapeutic activities;Patient/family education;Balance training    OT Goals(Current goals can be found in the care plan section) Acute Rehab OT Goals Patient Stated Goal: to be independent OT Goal Formulation: With patient Time For Goal Achievement: 08/17/14 Potential to Achieve Goals: Good ADL Goals Pt Will Perform Grooming: with modified independence;standing (at sink for 3 tasks. ) Pt Will Perform Lower Body Bathing: with modified independence;sit to/from stand Pt Will Perform Lower Body Dressing: with modified independence;sit to/from stand Pt Will Transfer to Toilet: with modified independence;ambulating Pt Will Perform Toileting - Clothing Manipulation and hygiene: with modified independence;sit to/from stand  OT Frequency: Min 2X/week   Barriers to D/C: Decreased caregiver support             End of Session Equipment Utilized During Treatment: Gait belt;Rolling walker  Activity Tolerance: Patient tolerated treatment well Patient left: in bed;with call bell/phone within reach;with nursing/sitter in room   Time: 1541-1635 OT Time Calculation (min): 54 min Charges:  OT General Charges $OT Visit: 1 Procedure OT Evaluation $Initial OT Evaluation Tier I: 1 Procedure OT Treatments $Self Care/Home Management : 38-52 mins  Rae LipsMiller, Dauna Ziska M 08/03/2014, 4:50 PM   Carney LivingLeeAnn Marie Rishi [name], OTR/L Occupational Therapist [phone] (pager)

## 2014-08-03 NOTE — Progress Notes (Signed)
PT Cancellation Note  Patient Details Name: Toni RosenthalLinda F [name] MRN: [id number] DOB: 04-28-1951   Cancelled Treatment:    Reason Eval/Treat Not Completed: Patient at procedure or test/unavailable  Patient having doppler performed this AM. Will follow up for PT evaluation when pt is available.   [address] Shampine Secor [place], [place]PT [phone]  Berton MountBarbour, Tykwon Fera S 08/03/2014, 8:57 AM

## 2014-08-03 NOTE — Progress Notes (Signed)
PROGRESS NOTE  Toni RosenthalLinda F [name] RUE:[id number] DOB: [birth year] DOA: 08/02/2014 PCP: [name],VEITA J, MD  [name] is a 63 y.o. female with a past medical history of diabetes mellitus on insulin, atrial fibrillation on anticoagulation, seizure disorder, history of orthostatic hypotension with previous episodes of syncope.  Upon presentation she felt lightheaded. She felt everything blacking out. She had to sit down. And she states that she may have passed out at that time. Denies any falls or injuries. She got up, still felt dizzy. She checked her blood pressure and apparently was 58/36. She called her brother who called 911. She felt nauseated. She was sweating. She had a headache. She also had some chest pain, shortness of breath and she felt as if her right side was weaker than normal. Oxygen levels were checked by EMS, which is 87%. Denies any medication changes recently. No bleeding that she has noticed recently. Denies any chest pain currently. Still feels as if her right side is slightly weaker. She does have a history of neuropathy and nerve entrapment in the past.  Assessment/Plan: Syncope: most likely due to orthostatic hypotension or dehydration. Blood pressure was initially low in the emergency department. With hydration, she has improved. 500 cc more of IVF  right-sided weakness:  MRI negative  history of atrial fibrillation, appears to be paroxysmal: Continue with her home medications and anticoagulation.   Abnormal UA: Appears to be contaminated sample. But since lactic acid was elevated and she had mentioned some symptoms to the ED provider we will treat with ceftriaxone for now. Follow up on urine cultures.  hypotension: Most likely due to orthostatic hypotension. We will hold her BP lowering agents for now. BP improved  history of seizure disorder: Continue with Keppra.  History of sarcoidosis: Follow-up with PCP.  Nonspecific changes on the CT abdomen: Would recommend PCP  to follow-up on same.  Diabetes mellitus type 2: Sliding scale coverage and Lantus at a lower dose. Check HbA1c.  Code Status: full Family Communication: patient Disposition Plan:    Consultants:    Procedures:     HPI/Subjective: Had some symptoms of dizziness with PT today  Objective: Filed Vitals:   08/03/14 0614  BP: 117/70  Pulse: 58  Temp: 98.2 F (36.8 C)  Resp: 19    Intake/Output Summary (Last 24 hours) at 08/03/14 0906 Last data filed at 08/02/14 2035  Gross per 24 hour  Intake    720 ml  Output      0 ml  Net    720 ml   Filed Weights   08/02/14 0438 08/02/14 1520  Weight: 83.915 kg (185 lb) 83.462 kg (184 lb)    Exam:   General:  A+Ox3, NAD  Cardiovascular: rrr  Respiratory: clear  Abdomen: +BS, soft   Data Reviewed: Basic Metabolic Panel:  Recent Labs Lab 08/02/14 0554 08/03/14 0450  NA 140 141  K 4.6 4.4  CL 102 104  CO2 25 24  GLUCOSE 177* 143*  BUN 17 14  CREATININE 1.59* 1.19*  CALCIUM 9.1 9.1  MG  --  2.1   Liver Function Tests:  Recent Labs Lab 08/03/14 0450  AST 15  ALT 10  ALKPHOS 96  BILITOT 0.5  PROT 7.2  ALBUMIN 3.8   No results for input(s): LIPASE, AMYLASE in the last 168 hours. No results for input(s): AMMONIA in the last 168 hours. CBC:  Recent Labs Lab 08/02/14 0554 08/03/14 0450  WBC 4.7 3.6*  HGB 12.9 11.9*  HCT  39.6 36.7  MCV 88.0 90.6  PLT 193 172   Cardiac Enzymes:  Recent Labs Lab 08/02/14 0554 08/02/14 1706 08/02/14 2343 08/03/14 0450  TROPONINI <0.30 <0.30 <0.30 <0.30   BNP (last 3 results)  Recent Labs  01/06/14 2020 08/02/14 0617  PROBNP 30.9 617.3*   CBG:  Recent Labs Lab 08/02/14 1633 08/02/14 2039 08/03/14 0612  GLUCAP 98 159* 139*    No results found for this or any previous visit (from the past 240 hour(s)).   Studies: Ct Angio Chest Pe W/cm &/or Wo Cm  08/02/2014   CLINICAL DATA:  Chest pain starting 4 p.m. yesterday, lower abdominal pain,  back pain  EXAM: CT ANGIOGRAPHY CHEST WITH CONTRAST  TECHNIQUE: Multidetector CT imaging of the chest was performed using the standard protocol during bolus administration of intravenous contrast. Multiplanar CT image reconstructions and MIPs were obtained to evaluate the vascular anatomy.  CONTRAST:  OMNIPAQUE IOHEXOL 350 MG/ML SOLN  COMPARISON:  None.  FINDINGS: Sagittal images of the thoracic spine are unremarkable. Sagittal view of the sternum is unremarkable. Central airways are patent.  The study is of excellent technical quality. There is no evidence of pulmonary embolus.  A left anterior mediastinal lymph node just lateral to aorta measures 1.9 by 1.1 cm. Right hilar lymph node measures 1.4 by 1.6 cm. Left hilar lymph node measures 1.9 by 1.4 cm. A precarinal lymph node measures 1.3 x 1.6 cm.  There is no aortic aneurysm. Atherosclerotic calcifications of coronary arteries are noted. No destructive bony lesions are noted.  Images of the lung parenchyma shows no acute infiltrate or pulmonary edema.  Review of the MIP images confirms the above findings.  No axillary adenopathy.  IMPRESSION: 1. No pulmonary embolus is noted. 2. There is mediastinal and bilateral hilar adenopathy. Although this may be reactive lymphoproliferative disease cannot be excluded. Follow-up examination in 3 months is recommended to assure stability or resolution. 3. No acute infiltrate or pulmonary edema. 4. Atherosclerotic calcifications of coronary arteries. 5. No destructive bony lesions are noted.   Electronically Signed   By: Natasha Mead M.D.   On: 08/02/2014 12:52   Mr Brain Wo Contrast  08/03/2014   CLINICAL DATA:  Lightheadedness when getting up from bed to go to kitchen at midnight. Possible syncopal episode.  EXAM: MRI HEAD WITHOUT CONTRAST  TECHNIQUE: Multiplanar, multiecho pulse sequences of the brain and surrounding structures were obtained without intravenous contrast.  COMPARISON:  CT of the head October 16, 2012  and MRI of the brain September 08, 2010  FINDINGS: The ventricles and sulci are normal. No abnormal parenchymal signal, mass lesions or mass of affect. No reduced diffusion to suggest acute ischemia. No susceptibility artifact to suggest hemorrhage. Cerebellar tonsils are at but not below the foramen magnum.  No abnormal extra-axial fluid collection. No abnormal calvarial bone marrow signal. Ocular globes and orbital contents are unremarkable though not tailored for evaluation. Visualized paranasal sinuses and mastoid air cells appear well-aerated. No abnormal sellar expansion. Craniocervical junction is maintained.  IMPRESSION: No acute intracranial process ; normal noncontrast MRI of the brain for age.   Electronically Signed   By: Awilda Metro   On: 08/03/2014 04:42   Ct Abdomen Pelvis W Contrast  08/02/2014   CLINICAL DATA:  Chest pain, lower abdominal pain, back pain  EXAM: CT ABDOMEN AND PELVIS WITH CONTRAST  TECHNIQUE: Multidetector CT imaging of the abdomen and pelvis was performed using the standard protocol following bolus administration of intravenous contrast.  CONTRAST:  100mL OMNIPAQUE IOHEXOL 350 MG/ML SOLN  COMPARISON:  12/29/2012  FINDINGS: Lung bases are unremarkable. Left hilar lymph node measures 1.5 by 1.3 cm. Right hilar lymph node measures 1.5 by 1.1 cm.  Heart size within normal limits. Enhanced liver shows no biliary ductal dilatation. No focal hepatic mass. No calcified gallstones are noted within gallbladder. Main pancreatic duct measures 3.6 mm in diameter without definite focal pancreatic mass. The the spleen and adrenal glands are unremarkable. Kidneys are symmetrical in size and enhancement. No hydronephrosis or hydroureter. Delayed renal images shows bilateral renal symmetrical excretion. Bilateral visualized proximal ureter is unremarkable. Probable cyst in upper pole of the left kidney measures 9 mm stable from prior exam.  No small bowel obstruction. Normal appendix. No  pericecal inflammation. The terminal ileum is unremarkable. Oral contrast material was given to the patient. No aortic aneurysm.  Some stool noted in descending colon and rectosigmoid colon. No distal colonic obstruction. The patient is status post hysterectomy. The urinary bladder is unremarkable. Pelvic phleboliths are stable.  IMPRESSION: 1. No acute inflammatory process within abdomen or pelvis. 2. No hydronephrosis or hydroureter. Stable probable cyst in upper pole of the left kidney. 3. There is mild prominent size main pancreatic duct measures 3.6 mm. No focal pancreatic mass is noted. No peripancreatic fluid. 4. Normal appendix.  No pericecal inflammation. 5. There is mild bilateral hilar adenopathy. This may be reactive in nature. Follow-up examination she should be based on clinical grounds.   Electronically Signed   By: Natasha MeadLiviu  Pop M.D.   On: 08/02/2014 12:32   Dg Chest Port 1 View  08/02/2014   CLINICAL DATA:  Chest pain and hypotension  EXAM: PORTABLE CHEST - 1 VIEW  COMPARISON:  01/06/2014  FINDINGS: No cardiomegaly for technique.  Negative aortic and hilar contours.  There is low lung volumes with interstitial crowding. There is no edema, consolidation, effusion, or pneumothorax. No osseous findings to explain chest pain.  IMPRESSION: Negative low volume chest.   Electronically Signed   By: Tiburcio PeaJonathan  Watts M.D.   On: 08/02/2014 06:11    Scheduled Meds: . atorvastatin  10 mg Oral Daily  . cefTRIAXone (ROCEPHIN)  IV  1 g Intravenous Q24H  . dofetilide  250 mcg Oral BID  . DULoxetine  60 mg Oral Daily  . insulin aspart  0-15 Units Subcutaneous TID WC  . insulin glargine  20 Units Subcutaneous QHS  . levETIRAcetam  2,000 mg Oral QHS  . mometasone-formoterol  2 puff Inhalation BID  . oxyCODONE-acetaminophen  1 tablet Oral 4 times per day   And  . oxyCODONE  5 mg Oral 4 times per day  . pantoprazole  40 mg Oral BID AC  . pregabalin  100 mg Oral TID  . rivaroxaban  20 mg Oral Q supper  .  sodium chloride  3 mL Intravenous Q12H  . sodium chloride  3 mL Intravenous Q12H   Continuous Infusions:  Antibiotics Given (last 72 hours)    None      Principal Problem:   Hypotension Active Problems:   Sarcoid   Syncope   A-fib   UTI (lower urinary tract infection)    Time spent: 35 min    Lavontay Kirk  [facility] Pager [phone]. If 7PM-7AM, please contact night-coverage at [email or web address], password [email or web address] 08/03/2014, 9:06 AM  LOS: 1 day

## 2014-08-03 NOTE — Evaluation (Signed)
Physical Therapy Evaluation Patient Details Name: [name] Shad MRN: [id number] DOB: [birth year] Today's Date: 08/03/2014   History of Present Illness  [name] Veney is a 63 y.o. female with a past medical history of diabetes mellitus on insulin, atrial fibrillation on anticoagulation, seizure disorder, history of orthostatic hypotension with previous episodes of syncope was in her usual state of health until last night around midnight when she got up from her bed to go to the kitchen. She felt lightheaded. She felt everything blacking out. She had to sit down. And she states that she may have passed out at that time. Denies any falls or injuries. She got up, still felt dizzy. She checked her blood pressure and apparently was 58/36. She called her brother who called 911. She felt nauseated. She was sweating. She had a headache. She also had some chest pain, shortness of breath and she felt as if her right side was weaker than normal. Oxygen levels were checked by EMS, which is 87%. She denies any trouble urinating per se. Denies any medication changes recently. No bleeding that she has noticed recently. Denies any chest pain currently. Still feels as if her right side is slightly weaker. She does have a history of neuropathy and nerve entrapment in the past.  Clinical Impression  Pt admitted with above complications. Pt currently with functional limitations due to the deficits listed below (see PT Problem List). Pt continues to report dizziness and demonstrates increased difficulty with gait the further she ambulates. Vital signs stable throughout (see below). Required min assist for balance while ambulating. Will continue to follow acutely and monitor progression Pt will benefit from skilled PT to increase their independence and safety with mobility to allow discharge to the venue listed below.       Follow Up Recommendations Home health PT    Equipment Recommendations  Rolling walker with 5"  wheels    Recommendations for Other Services       Precautions / Restrictions Precautions Precautions: Fall Precaution Comments: syncope Restrictions Weight Bearing Restrictions: No      Mobility  Bed Mobility Overal bed mobility: Modified Independent                Transfers Overall transfer level: Needs assistance Equipment used: Rolling walker (2 wheeled) Transfers: Sit to/from Stand Sit to Stand: Supervision         General transfer comment: Supervision for safety. Pt requests to hold RW due to dizziness. No significant sway noted upon standing.  Ambulation/Gait Ambulation/Gait assistance: Min assist Ambulation Distance (Feet): 60 Feet Assistive device: Rolling walker (2 wheeled) Gait Pattern/deviations: Step-through pattern;Decreased stride length;Staggering left;Staggering right;Leaning posteriorly;Drifts right/left;Narrow base of support;Scissoring   Gait velocity interpretation: Below normal speed for age/gender General Gait Details: Gait mechanics declined with distance ambulated. Pt reported feeling dizzy (VSS-see below). Require min assist for loss of balance. Frequent cues to keep eyes open and weight through UEs for support.   Stairs            Wheelchair Mobility    Modified Rankin (Stroke Patients Only)       Balance Overall balance assessment: Needs assistance Sitting-balance support: No upper extremity supported;Feet supported Sitting balance-Leahy Scale: Good     Standing balance support: No upper extremity supported Standing balance-Leahy Scale: Fair                               Pertinent Vitals/Pain Pain Assessment: 0-10  Pain Score: 6  Pain Location: lower left ribs, lower back, abdomen Pain Descriptors / Indicators: Pounding Pain Intervention(s): Limited activity within patient's tolerance;Monitored during session;Premedicated before session;Repositioned   Symptoms of dizziness in all positions assessed. Pt  on room air  Sitting:  -BP 124/62 -HR 64 -SpO2 96%  Standing: - BP 111/60 - HR 68 -SpO2 96%  Immediately after ambulating while standing: - BP 116/55 - HR 65 - SpO2 98%    Home Living Family/patient expects to be discharged to:: Private residence Living Arrangements: Alone Available Help at Discharge: Family;Available PRN/intermittently (brother checks on pt daily) Type of Home: House Home Access: Stairs to enter Entrance Stairs-Rails: Doctor, general practiceight;Left Entrance Stairs-Number of Steps: 5 Home Layout: One level Home Equipment: Bedside commode;Tub bench      Prior Function Level of Independence: Independent               Hand Dominance   Dominant Hand: Right    Extremity/Trunk Assessment   Upper Extremity Assessment: Defer to OT evaluation           Lower Extremity Assessment: Overall WFL for tasks assessed (Hx of peripheral neuropathy per pt report.)         Communication   Communication: No difficulties  Cognition Arousal/Alertness: Awake/alert Behavior During Therapy: WFL for tasks assessed/performed Overall Cognitive Status: Within Functional Limits for tasks assessed                      General Comments General comments (skin integrity, edema, etc.): Pt began to have slurred speech, became lethargic, and gait mechanics decreased signficantly with further ambulatory distance. Improved upon sitting. Pt reported dizziness, nausea, and SOB although no dyspnea noted.    Exercises        Assessment/Plan    PT Assessment Patient needs continued PT services  PT Diagnosis Difficulty walking;Abnormality of gait   PT Problem List Decreased activity tolerance;Decreased balance;Decreased mobility;Decreased coordination;Decreased knowledge of use of DME;Cardiopulmonary status limiting activity;Impaired sensation;Pain  PT Treatment Interventions DME instruction;Gait training;Stair training;Functional mobility training;Therapeutic  activities;Therapeutic exercise;Balance training;Neuromuscular re-education;Patient/family education;Modalities   PT Goals (Current goals can be found in the Care Plan section) Acute Rehab PT Goals Patient Stated Goal: No dizziness PT Goal Formulation: With patient Time For Goal Achievement: 08/17/14 Potential to Achieve Goals: Good    Frequency Min 3X/week   Barriers to discharge Decreased caregiver support Pt lives alone. Has a family member check on her daily    Co-evaluation               End of Session Equipment Utilized During Treatment: Gait belt Activity Tolerance: Other (comment) (Limited by dizziness) Patient left: in chair;with call bell/phone within reach Nurse Communication: Mobility status         Time: 0925-0950 PT Time Calculation (min) (ACUTE ONLY): 25 min   Charges:   PT Evaluation $Initial PT Evaluation Tier I: 1 Procedure PT Treatments $Gait Training: 8-22 mins   PT G CodesBerton Mount:          Bertil Brickey S 08/03/2014, 10:36 AM  Charlsie MerlesLogan Secor Artemisia [place], PT [phone]

## 2014-08-03 NOTE — Progress Notes (Signed)
Echo Lab  2D Echocardiogram completed.  [name], RDCS 08/03/2014 8:51 AM

## 2014-08-03 NOTE — Progress Notes (Signed)
*  PRELIMINARY RESULTS* Vascular Ultrasound Carotid Duplex (Doppler) has been completed.  Findings suggest 1-39% internal carotid artery stenosis bilaterally. Vertebral arteries are patent with antegrade flow.  08/03/2014 8:49 AM Gertie FeyMichelle Airyana Sprunger, RVT, RDCS, RDMS

## 2014-08-04 DIAGNOSIS — E785 Hyperlipidemia, unspecified: Secondary | ICD-10-CM | POA: Insufficient documentation

## 2014-08-04 DIAGNOSIS — I951 Orthostatic hypotension: Secondary | ICD-10-CM

## 2014-08-04 DIAGNOSIS — I482 Chronic atrial fibrillation: Secondary | ICD-10-CM

## 2014-08-04 LAB — BASIC METABOLIC PANEL
ANION GAP: 11 (ref 5–15)
BUN: 13 mg/dL (ref 6–23)
CHLORIDE: 101 meq/L (ref 96–112)
CO2: 26 mEq/L (ref 19–32)
Calcium: 9.2 mg/dL (ref 8.4–10.5)
Creatinine, Ser: 1.26 mg/dL — ABNORMAL HIGH (ref 0.50–1.10)
GFR calc Af Amer: 52 mL/min — ABNORMAL LOW (ref >90)
GFR calc non Af Amer: 45 mL/min — ABNORMAL LOW (ref >90)
Glucose, Bld: 127 mg/dL — ABNORMAL HIGH (ref 70–99)
POTASSIUM: 4.6 meq/L (ref 3.7–5.3)
Sodium: 138 mEq/L (ref 137–147)

## 2014-08-04 LAB — CBC
HCT: 35.4 % — ABNORMAL LOW (ref 36.0–46.0)
Hemoglobin: 11.3 g/dL — ABNORMAL LOW (ref 12.0–15.0)
MCH: 29.4 pg (ref 26.0–34.0)
MCHC: 31.9 g/dL (ref 30.0–36.0)
MCV: 91.9 fL (ref 78.0–100.0)
Platelets: 168 10*3/uL (ref 150–400)
RBC: 3.85 MIL/uL — ABNORMAL LOW (ref 3.87–5.11)
RDW: 12.9 % (ref 11.5–15.5)
WBC: 3.4 10*3/uL — ABNORMAL LOW (ref 4.0–10.5)

## 2014-08-04 LAB — GLUCOSE, CAPILLARY
Glucose-Capillary: 119 mg/dL — ABNORMAL HIGH (ref 70–99)
Glucose-Capillary: 169 mg/dL — ABNORMAL HIGH (ref 70–99)

## 2014-08-04 MED ORDER — DILTIAZEM HCL 30 MG PO TABS: 30.0000 mg | ORAL_TABLET | Freq: Two times a day (BID) | ORAL | Status: DC

## 2014-08-04 MED ORDER — OXYCODONE-ACETAMINOPHEN 10-325 MG PO TABS: 1.0000 | ORAL_TABLET | Freq: Three times a day (TID) | ORAL | Status: DC | PRN

## 2014-08-04 MED ORDER — GABAPENTIN 100 MG PO CAPS: 100.0000 mg | ORAL_CAPSULE | Freq: Three times a day (TID) | ORAL | Status: DC

## 2014-08-04 MED ORDER — CEFUROXIME AXETIL 500 MG PO TABS: 500.0000 mg | ORAL_TABLET | Freq: Two times a day (BID) | ORAL | Status: DC

## 2014-08-04 MED ORDER — CARVEDILOL 12.5 MG PO TABS: 12.5000 mg | ORAL_TABLET | Freq: Two times a day (BID) | ORAL | Status: DC

## 2014-08-04 NOTE — Progress Notes (Signed)
Dc Instructions given and patient verbalized understanding

## 2014-08-04 NOTE — Discharge Summary (Addendum)
Physician Discharge Summary  [name]:[id number] DOB: [birth year] DOA: 08/02/2014  PCP: Geraldo Pitter, MD  Admit date: 08/02/2014 Discharge date: 08/04/2014  Time spent: >30 minutes  Recommendations for Outpatient Follow-up:  Check BMET to follow electrolytes and renal function Reassess BP and adjust medications as needed  Discharge Diagnoses:  Principal Problem:   Hypotension Active Problems:   Sarcoid   Syncope   A-fib   UTI (lower urinary tract infection)   Discharge Condition: stable and improved. Will discharge home with [facility] services.  Diet recommendation: heart healthy and low carb diet  Filed Weights   08/02/14 0438 08/02/14 1520  Weight: 83.915 kg (185 lb) 83.462 kg (184 lb)    History of present illness:  63 y.o. female with a past medical history of diabetes mellitus on insulin, atrial fibrillation on anticoagulation, seizure disorder, history of orthostatic hypotension with previous episodes of syncope. Upon presentation she felt lightheaded. She felt everything blacking out. She had to sit down. And she states that she may have passed out at that time. Denies any falls or injuries. She got up, still felt dizzy. She checked her blood pressure and apparently was 58/36. She called her brother who called 911. She felt nauseated. She was sweating. She had a headache. She also had some chest pain, shortness of breath and she felt as if her right side was weaker than normal. Oxygen levels were checked by EMS, which is 87%. Denies any medication changes recently. No bleeding that she has noticed recently. Denies any chest pain currently. Still feels as if her right side is slightly weaker. She does have a history of neuropathy and nerve entrapment in the past.  Hospital Course:  1-syncope: appears to be mainly due to orthostatic changes and UTI -no abnormalities on telemetry -will adjust antihypertensive regimen -patient advise to keep herself well  hydrated -minimize usage of narcotics -will treat UTI  2-UTI: will complete a total of 7 days therapy with use of ceftin -currently no dysuria, no feevr  3-acute on chronic renal failure (at baseline CKD stage 3): most likely from UTI, hypotension and dehydration -improved/back to baseline after IVF's -patient advise to keep herself hydrated -will continue treatment of UTI -follow BMET during follow up visit -antihypertensive meds adjusted  4-hx of seizure: no seizure activity -continue keppra  5-chronic atrial fibrillation: continue cardizem; coreg and tikosyn  6-hx of sarcoidosis: continue current meds and follow with PCP  7-chronic pain and neuropathy: narcotics change to Q8H as needed; continue lyrica nd added neurontin  8-DM type 2: continue lantus and metformin -advise to follow low carb diet  9-lungs hilar adenpathy: may be reactive lymphoproliferative disease cannot be excluded. Follow-up examination in 3 months is recommended to assure stability or resolution.  *Rest of medical problems stable and plan is to continue current meds.   Procedures:  See below for x-ray reports   Consultations:  None   Discharge Exam: Filed Vitals:   08/04/14 0507  BP: 112/57  Pulse: 63  Temp: 98.4 F (36.9 C)  Resp: 18    General: afebrile, feeling better; denies any further syncope events. Cardiovascular: S1 and S2, no rubs or gallops Respiratory: CTA bilaterally Abd: soft, NT, positive BS Extremities: no cyanosis or clubbing    Discharge Instructions You were cared for by a hospitalist during your hospital stay. If you have any questions about your discharge medications or the care you received while you were in the hospital after you are discharged, you can call the  unit and asked to speak with the hospitalist on call if the hospitalist that took care of you is not available. Once you are discharged, your primary care physician will handle any further medical issues.  Please note that NO REFILLS for any discharge medications will be authorized once you are discharged, as it is imperative that you return to your primary care physician (or establish a relationship with a primary care physician if you do not have one) for your aftercare needs so that they can reassess your need for medications and monitor your lab values.  Discharge Instructions    Diet - low sodium heart healthy    Complete by:  As directed      Discharge instructions    Complete by:  As directed   Arrange follow up with PCP in 1 week Take medication as instructed Keep yourself well hydrated Follow low sodium diet          Current Discharge Medication List    START taking these medications   Details  cefUROXime (CEFTIN) 500 MG tablet Take 1 tablet (500 mg total) by mouth 2 (two) times daily with a meal. Qty: 10 tablet, Refills: 0    gabapentin (NEURONTIN) 100 MG capsule Take 1 capsule (100 mg total) by mouth 3 (three) times daily. Qty: 90 capsule, Refills: 0      CONTINUE these medications which have CHANGED   Details  carvedilol (COREG) 12.5 MG tablet Take 1 tablet (12.5 mg total) by mouth 2 (two) times daily with a meal. Takes 1.5 tablets .    diltiazem (CARDIZEM) 30 MG tablet Take 1 tablet (30 mg total) by mouth 2 (two) times daily. Qty: 120 tablet, Refills: 0    oxyCODONE-acetaminophen (PERCOCET) 10-325 MG per tablet Take 1 tablet by mouth every 8 (eight) hours as needed for pain. For pain      CONTINUE these medications which have NOT CHANGED   Details  albuterol (PROVENTIL HFA;VENTOLIN HFA) 108 (90 BASE) MCG/ACT inhaler Inhale 2 puffs into the lungs every 4 (four) hours as needed for wheezing or shortness of breath.    atorvastatin (LIPITOR) 10 MG tablet Take 10 mg by mouth daily.    BYETTA 10 MCG PEN 10 MCG/0.04ML SOPN injection Inject 10 Units into the skin daily. Refills: 0    calcium carbonate (OS-CAL) 600 MG TABS Take 1 tablet (600 mg total) by mouth daily.     cholecalciferol (VITAMIN D) 1000 UNITS tablet Take 1,000 Units by mouth 2 (two) times daily.     Cranberry 500 MG CAPS Take 500 mg by mouth 2 (two) times daily.    docusate sodium (COLACE) 100 MG capsule Take 100 mg by mouth 2 (two) times daily as needed for constipation. For stool softner    dofetilide ([name]) 250 MCG capsule Take 1 capsule (250 mcg total) by mouth 2 (two) times daily. Qty: 180 capsule, Refills: 4    DULERA 200-5 MCG/ACT AERO Inhale 2 puffs into the lungs 2 (two) times daily. Refills: 0    DULoxetine (CYMBALTA) 60 MG capsule Take 60 mg by mouth daily.    ferrous sulfate 325 (65 FE) MG tablet Take 325 mg by mouth daily with breakfast.    LANTUS SOLOSTAR 100 UNIT/ML Solostar Pen Inject 40 Units into the skin at bedtime. Refills: 0    levETIRAcetam (KEPPRA XR) 500 MG 24 hr tablet Take 4 tablets (2,000 mg total) by mouth at bedtime. Qty: 120 tablet, Refills: 12    loratadine (CLARITIN) 10 MG  tablet Take 10 mg by mouth every morning.    metFORMIN (GLUCOPHAGE) 500 MG tablet Take 500 mg by mouth 2 (two) times daily with a meal.    NEXIUM 40 MG capsule take 1 capsule by mouth twice a day WITH MEALS Qty: 60 capsule, Refills: 2    niacin (NIASPAN) 500 MG CR tablet Take 500 mg by mouth at bedtime.     potassium chloride (K-DUR) 10 MEQ tablet Take 1 tablet (10 mEq total) by mouth daily. Qty: 90 tablet, Refills: 1    pregabalin (LYRICA) 100 MG capsule Take 100 mg by mouth 3 (three) times daily.    rivaroxaban (XARELTO) 20 MG TABS tablet Take 1 tablet (20 mg total) by mouth daily with supper. Qty: 30 tablet, Refills: 0    vitamin C (ASCORBIC ACID) 500 MG tablet Take 500 mg by mouth 2 (two) times daily.    nitroGLYCERIN (NITROSTAT) 0.4 MG SL tablet Place 0.4 mg under the tongue every 5 (five) minutes as needed for chest pain.    ondansetron (ZOFRAN-ODT) 8 MG disintegrating tablet Take 8 mg by mouth every 6 (six) hours as needed for nausea.      STOP taking these  medications     doxazosin (CARDURA) 4 MG tablet        Allergies  Allergen Reactions  . Amitriptyline Other (See Comments)    Disoriented.  . Hydromorphone Hcl Other (See Comments)    blood pressure drops.  . Prednisone     SENT INTO AFIB     The results of significant diagnostics from this hospitalization (including imaging, microbiology, ancillary and laboratory) are listed below for reference.    Significant Diagnostic Studies: Ct Angio Chest Pe W/cm &/or Wo Cm  08/02/2014   CLINICAL DATA:  Chest pain starting 4 p.m. yesterday, lower abdominal pain, back pain  EXAM: CT ANGIOGRAPHY CHEST WITH CONTRAST  TECHNIQUE: Multidetector CT imaging of the chest was performed using the standard protocol during bolus administration of intravenous contrast. Multiplanar CT image reconstructions and MIPs were obtained to evaluate the vascular anatomy.  CONTRAST:  100mL OMNIPAQUE IOHEXOL 350 MG/ML SOLN  COMPARISON:  None.  FINDINGS: Sagittal images of the thoracic spine are unremarkable. Sagittal view of the sternum is unremarkable. Central airways are patent.  The study is of excellent technical quality. There is no evidence of pulmonary embolus.  A left anterior mediastinal lymph node just lateral to aorta measures 1.9 by 1.1 cm. Right hilar lymph node measures 1.4 by 1.6 cm. Left hilar lymph node measures 1.9 by 1.4 cm. A precarinal lymph node measures 1.3 x 1.6 cm.  There is no aortic aneurysm. Atherosclerotic calcifications of coronary arteries are noted. No destructive bony lesions are noted.  Images of the lung parenchyma shows no acute infiltrate or pulmonary edema.  Review of the MIP images confirms the above findings.  No axillary adenopathy.  IMPRESSION: 1. No pulmonary embolus is noted. 2. There is mediastinal and bilateral hilar adenopathy. Although this may be reactive lymphoproliferative disease cannot be excluded. Follow-up examination in 3 months is recommended to assure stability or  resolution. 3. No acute infiltrate or pulmonary edema. 4. Atherosclerotic calcifications of coronary arteries. 5. No destructive bony lesions are noted.   Electronically Signed   By: Natasha MeadLiviu  Pop M.D.   On: 08/02/2014 12:52   Mr Brain Wo Contrast  08/03/2014   CLINICAL DATA:  Lightheadedness when getting up from bed to go to kitchen at midnight. Possible syncopal episode.  EXAM: MRI HEAD  WITHOUT CONTRAST  TECHNIQUE: Multiplanar, multiecho pulse sequences of the brain and surrounding structures were obtained without intravenous contrast.  COMPARISON:  CT of the head October 16, 2012 and MRI of the brain September 08, 2010  FINDINGS: The ventricles and sulci are normal. No abnormal parenchymal signal, mass lesions or mass of affect. No reduced diffusion to suggest acute ischemia. No susceptibility artifact to suggest hemorrhage. Cerebellar tonsils are at but not below the foramen magnum.  No abnormal extra-axial fluid collection. No abnormal calvarial bone marrow signal. Ocular globes and orbital contents are unremarkable though not tailored for evaluation. Visualized paranasal sinuses and mastoid air cells appear well-aerated. No abnormal sellar expansion. Craniocervical junction is maintained.  IMPRESSION: No acute intracranial process ; normal noncontrast MRI of the brain for age.   Electronically Signed   By: Awilda Metro   On: 08/03/2014 04:42   Ct Abdomen Pelvis W Contrast  08/02/2014   CLINICAL DATA:  Chest pain, lower abdominal pain, back pain  EXAM: CT ABDOMEN AND PELVIS WITH CONTRAST  TECHNIQUE: Multidetector CT imaging of the abdomen and pelvis was performed using the standard protocol following bolus administration of intravenous contrast.  CONTRAST:  OMNIPAQUE IOHEXOL 350 MG/ML SOLN  COMPARISON:  12/29/2012  FINDINGS: Lung bases are unremarkable. Left hilar lymph node measures 1.5 by 1.3 cm. Right hilar lymph node measures 1.5 by 1.1 cm.  Heart size within normal limits. Enhanced liver shows  no biliary ductal dilatation. No focal hepatic mass. No calcified gallstones are noted within gallbladder. Main pancreatic duct measures 3.6 mm in diameter without definite focal pancreatic mass. The the spleen and adrenal glands are unremarkable. Kidneys are symmetrical in size and enhancement. No hydronephrosis or hydroureter. Delayed renal images shows bilateral renal symmetrical excretion. Bilateral visualized proximal ureter is unremarkable. Probable cyst in upper pole of the left kidney measures 9 mm stable from prior exam.  No small bowel obstruction. Normal appendix. No pericecal inflammation. The terminal ileum is unremarkable. Oral contrast material was given to the patient. No aortic aneurysm.  Some stool noted in descending colon and rectosigmoid colon. No distal colonic obstruction. The patient is status post hysterectomy. The urinary bladder is unremarkable. Pelvic phleboliths are stable.  IMPRESSION: 1. No acute inflammatory process within abdomen or pelvis. 2. No hydronephrosis or hydroureter. Stable probable cyst in upper pole of the left kidney. 3. There is mild prominent size main pancreatic duct measures 3.6 mm. No focal pancreatic mass is noted. No peripancreatic fluid. 4. Normal appendix.  No pericecal inflammation. 5. There is mild bilateral hilar adenopathy. This may be reactive in nature. Follow-up examination she should be based on clinical grounds.   Electronically Signed   By: Natasha Mead M.D.   On: 08/02/2014 12:32   Dg Chest Port 1 View  08/02/2014   CLINICAL DATA:  Chest pain and hypotension  EXAM: PORTABLE CHEST - 1 VIEW  COMPARISON:  01/06/2014  FINDINGS: No cardiomegaly for technique.  Negative aortic and hilar contours.  There is low lung volumes with interstitial crowding. There is no edema, consolidation, effusion, or pneumothorax. No osseous findings to explain chest pain.  IMPRESSION: Negative low volume chest.   Electronically Signed   By: Tiburcio Pea M.D.   On:  08/02/2014 06:11    Microbiology: Recent Results (from the past 240 hour(s))  Urine culture     Status: None   Collection Time: 08/02/14  8:07 AM  Result Value Ref Range Status   Specimen Description URINE, RANDOM  Final   Special Requests NONE  Final   Culture  Setup Time   Final    08/02/2014 21:53 Performed at [facility] Count   Final    >=100,000 COLONIES/ML Performed at [facility]    Culture   Final    Multiple bacterial morphotypes present, none predominant. Suggest appropriate recollection if clinically indicated. Performed at [facility]    Report Status 08/03/2014 FINAL  Final     Labs: Basic Metabolic Panel:  Recent Labs Lab 08/02/14 0554 08/03/14 0450 08/04/14 0238  NA 140 141 138  K 4.6 4.4 4.6  CL 102 104 101  CO2 25 24 26   GLUCOSE 177* 143* 127*  BUN 17 14 13   CREATININE 1.59* 1.19* 1.26*  CALCIUM 9.1 9.1 9.2  MG  --  2.1  --    Liver Function Tests:  Recent Labs Lab 08/03/14 0450  AST 15  ALT 10  ALKPHOS 96  BILITOT 0.5  PROT 7.2  ALBUMIN 3.8   CBC:  Recent Labs Lab 08/02/14 0554 08/03/14 0450 08/04/14 0238  WBC 4.7 3.6* 3.4*  HGB 12.9 11.9* 11.3*  HCT 39.6 36.7 35.4*  MCV 88.0 90.6 91.9  PLT 193 172 168   Cardiac Enzymes:  Recent Labs Lab 08/02/14 0554 08/02/14 1706 08/02/14 2343 08/03/14 0450  TROPONINI <0.30 <0.30 <0.30 <0.30   BNP: BNP (last 3 results)  Recent Labs  01/06/14 2020 08/02/14 0617  PROBNP 30.9 617.3*   CBG:  Recent Labs Lab 08/03/14 0612 08/03/14 1201 08/03/14 1630 08/03/14 2055 08/04/14 0621  GLUCAP 139* 181* 134* 137* 119*   Signed:  Vassie Loll  [facility] 08/04/2014, 11:32 AM

## 2014-08-04 NOTE — Progress Notes (Addendum)
Physical Therapy Treatment Patient Details Name: Toni RosenthalLinda F [name] MRN: [id number] DOB: 1951-06-10 Today's Date: 08/04/2014    History of Present Illness Toni RosenthalLinda F [name] is a 63 y.o. female with a past medical history of diabetes mellitus on insulin, atrial fibrillation on anticoagulation, seizure disorder, history of orthostatic hypotension with previous episodes of syncope was in her usual state of health until last night around midnight when she got up from her bed to go to the kitchen. She felt lightheaded. She felt everything blacking out. She had to sit down. And she states that she may have passed out at that time. Denies any falls or injuries. She got up, still felt dizzy. She checked her blood pressure and apparently was 58/36. She called her brother who called 911. She felt nauseated. She was sweating. She had a headache. She also had some chest pain, shortness of breath and she felt as if her right side was weaker than normal. Oxygen levels were checked by EMS, which is 87%. She denies any trouble urinating per se. Denies any medication changes recently. No bleeding that she has noticed recently. Denies any chest pain currently. Still feels as if her right side is slightly weaker. She does have a history of neuropathy and nerve entrapment in the past.    PT Comments    Patient making some improvements today. Able to ambulate without physical assist up to 60 feet but requires a rolling walker for support. Safely completed stair training and tolerated exercises well. Tends to be lethargic, speaking normally but keeps eyes closed often. States she is trying to arrange for her daughter to come stay with her for a while but is unsure of how much help the daughter can provide. Pt will greatly benefit from RW and home health PT. Patient will continue to benefit from skilled physical therapy services to further improve independence with functional mobility.   Follow Up Recommendations  Home health  PT     Equipment Recommendations  Rolling walker with 5" wheels    Recommendations for Other Services       Precautions / Restrictions Precautions Precautions: Fall Precaution Comments: syncope Restrictions Weight Bearing Restrictions: No    Mobility  Bed Mobility Overal bed mobility: Modified Independent                Transfers Overall transfer level: Needs assistance Equipment used: Rolling walker (2 wheeled) Transfers: Sit to/from Stand Sit to Stand: Supervision         General transfer comment: Supervision for safety. States dizziness has improved compared to yesterday. VC for hand placement prior to standing. Did not require physical assist. Performed from recliner and lowest bed setting today.  Ambulation/Gait Ambulation/Gait assistance: Min guard Ambulation Distance (Feet): 60 Feet (additional bout of 30) Assistive device: Rolling walker (2 wheeled) Gait Pattern/deviations: Step-through pattern;Decreased stride length;Staggering right;Narrow base of support;Leaning posteriorly   Gait velocity interpretation: Below normal speed for age/gender General Gait Details: Required standing rest break halfway through first ambulatory bout. Again mechanics decrease with fatigue. Educated on energy conservation. Pt required to lean against wall, no shortness of breath but eyes become heavy. Was able to complete distance without any physical assist from PT. States she feels she is doing better compared to yesterday.   Stairs Stairs: Yes Stairs assistance: Supervision Stair Management: One rail Right;Step to pattern;Sideways Number of Stairs: 5 General stair comments: Educated on safe stair navigation techniques with both hands on single rail and VC for sequencing with sideways step approach.  Able to complete without physical assist or loss of balance.  Wheelchair Mobility    Modified Rankin (Stroke Patients Only)       Balance                                     Cognition Arousal/Alertness: Lethargic;Suspect due to medications Behavior [facility] for tasks assessed/performed Overall Cognitive Status: Within Functional Limits for tasks assessed                      Exercises General Exercises - Lower Extremity Ankle Circles/Pumps: AROM;Both;10 reps;Seated Long Arc Quad: Strengthening;Both;10 reps;Seated Hip Flexion/Marching: Strengthening;Both;10 reps;Seated    General Comments General comments (skin integrity, edema, etc.): Pt still lethargic after ambulating. Speaking nomally although tends to keep her eyes closed.      Pertinent Vitals/Pain Pain Assessment: 0-10 Pain Score:  ("Feet and knees hurt when I walk" no value given) Pain Location: Knees/feet Pain Descriptors / Indicators: Burning Pain Intervention(s): Limited activity within patient's tolerance;Monitored during session;Repositioned    Home Living                      Prior Function            PT Goals (current goals can now be found in the care plan section) Acute Rehab PT Goals PT Goal Formulation: With patient Time For Goal Achievement: 08/17/14 Potential to Achieve Goals: Good Progress towards PT goals: Progressing toward goals    Frequency  Min 3X/week    PT Plan Current plan remains appropriate    Co-evaluation             End of Session Equipment Utilized During Treatment: Gait belt Activity Tolerance: Patient tolerated treatment well Patient left: in chair;with call bell/phone within reach     Time: 1209-1234 PT Time Calculation (min) (ACUTE ONLY): 25 min  Charges:  $Gait Training: 8-22 mins $Therapeutic Activity: 8-22 mins                    G Codes:      [place], Ferrell Flam S 08/04/2014, 1:52 PM Sunday SpillersLogan Secor [place], [place]PT [phone]

## 2014-08-22 NOTE — Progress Notes (Signed)
Patient ID: [name], female   DOB: [birth year], 64 y.o.   MRN: [id number] 63 y.o. with  HTN and PAF On tikosyn. Recent hospitalization 5/15  with PAF converted with cardizem Xarelto started. Normal cath in 2012  Echo 5/24 normal EF and only mild LAE  Study Conclusions  - Left ventricle: The cavity size was normal. Wall thickness was increased in a pattern of mild LVH. Systolic function was normal. The estimated ejection fraction was in the range of 60% to 65%. Wall motion was normal; there were no regional wall motion abnormalities. Doppler parameters are consistent with abnormal left ventricular relaxation (grade 1 diastolic dysfunction). - Left atrium: The atrium was mildly dilated.  Doing well with no recurrent palpitations   Seizure disorder followed by neurology Last one also in 5/15 Keppra increased at that time  Discussed need to stay on xarelto with recurrent afib and [place] VASC score of 3    Seen at [facility] 12/15 with UTI and orthostasis Meds adjusted Rx with Ceftin   ROS: Denies fever, malais, weight loss, blurry vision, decreased visual acuity, cough, sputum, SOB, hemoptysis, pleuritic pain, palpitaitons, heartburn, abdominal pain, melena, lower extremity edema, claudication, or rash.  All other systems reviewed and negative  General: Affect appropriate Healthy:  appears stated age HEENT: normal Neck supple with no adenopathy JVP normal no bruits no thyromegaly Lungs clear with no wheezing and good diaphragmatic motion Heart:  S1/S2 no murmur, no rub, gallop or click PMI normal Abdomen: benighn, BS positve, no tenderness, no AAA no bruit.  No HSM or HJR Distal pulses intact with no bruits No edema Neuro non-focal Skin warm and dry No muscular weakness   Current Outpatient Prescriptions  Medication Sig Dispense Refill  . albuterol (PROVENTIL HFA;VENTOLIN HFA) 108 (90 BASE) MCG/ACT inhaler Inhale 2 puffs into the lungs every 4 (four) hours as needed for wheezing or  shortness of breath.    Marland Kitchen atorvastatin (LIPITOR) 10 MG tablet Take 10 mg by mouth daily.    Marland Kitchen BYETTA 10 MCG PEN 10 MCG/0.04ML SOPN injection Inject 10 Units into the skin daily.  0  . calcium carbonate (OS-CAL) 600 MG TABS Take 1 tablet (600 mg total) by mouth daily.    . carvedilol (COREG) 12.5 MG tablet Take 1 tablet (12.5 mg total) by mouth 2 (two) times daily with a meal. Takes 1.5 tablets .    . cefUROXime (CEFTIN) 500 MG tablet Take 1 tablet (500 mg total) by mouth 2 (two) times daily with a meal. 10 tablet 0  . cholecalciferol (VITAMIN D) 1000 UNITS tablet Take 1,000 Units by mouth 2 (two) times daily.     . Cranberry 500 MG CAPS Take 500 mg by mouth 2 (two) times daily.    Marland Kitchen diltiazem (CARDIZEM) 30 MG tablet Take 1 tablet (30 mg total) by mouth 2 (two) times daily. 120 tablet 0  . docusate sodium (COLACE) 100 MG capsule Take 100 mg by mouth 2 (two) times daily as needed for constipation. For stool softner    . dofetilide ([name]) 250 MCG capsule Take 1 capsule (250 mcg total) by mouth 2 (two) times daily. 180 capsule 4  . DULERA 200-5 MCG/ACT AERO Inhale 2 puffs into the lungs 2 (two) times daily.  0  . DULoxetine (CYMBALTA) 60 MG capsule Take 60 mg by mouth daily.    . ferrous sulfate 325 (65 FE) MG tablet Take 325 mg by mouth daily with breakfast.    . gabapentin (NEURONTIN) 100 MG  capsule Take 1 capsule (100 mg total) by mouth 3 (three) times daily. 90 capsule 0  . LANTUS SOLOSTAR 100 UNIT/ML Solostar Pen Inject 40 Units into the skin at bedtime.  0  . levETIRAcetam (KEPPRA XR) 500 MG 24 hr tablet Take 4 tablets (2,000 mg total) by mouth at bedtime. 120 tablet 12  . loratadine (CLARITIN) 10 MG tablet Take 10 mg by mouth every morning.    . metFORMIN (GLUCOPHAGE) 500 MG tablet Take 500 mg by mouth 2 (two) times daily with a meal.    . NEXIUM 40 MG capsule take 1 capsule by mouth twice a day WITH MEALS 60 capsule 2  . niacin (NIASPAN) 500 MG CR tablet Take 500 mg by mouth at bedtime.      . nitroGLYCERIN (NITROSTAT) 0.4 MG SL tablet Place 0.4 mg under the tongue every 5 (five) minutes as needed for chest pain.    Marland Kitchen. ondansetron (ZOFRAN-ODT) 8 MG disintegrating tablet Take 8 mg by mouth every 6 (six) hours as needed for nausea.    Marland Kitchen. oxyCODONE-acetaminophen (PERCOCET) 10-325 MG per tablet Take 1 tablet by mouth every 8 (eight) hours as needed for pain. For pain    . potassium chloride (K-DUR) 10 MEQ tablet Take 1 tablet (10 mEq total) by mouth daily. 90 tablet 1  . pregabalin (LYRICA) 100 MG capsule Take 100 mg by mouth 3 (three) times daily.    . rivaroxaban (XARELTO) 20 MG TABS tablet Take 1 tablet (20 mg total) by mouth daily with supper. 30 tablet 0  . vitamin C (ASCORBIC ACID) 500 MG tablet Take 500 mg by mouth 2 (two) times daily.     No current facility-administered medications for this visit.    Allergies  Amitriptyline; Hydromorphone hcl; and Prednisone  Electrocardiogram:  5/15 SR rate 61 inferolateral T wave changes  QT 444   Assessment and Plan

## 2014-08-23 ENCOUNTER — Encounter: Payer: Self-pay | Admitting: Cardiovascular Disease

## 2014-08-23 ENCOUNTER — Ambulatory Visit (INDEPENDENT_AMBULATORY_CARE_PROVIDER_SITE_OTHER): Payer: Medicaid Other | Admitting: Cardiovascular Disease

## 2014-08-23 VITALS — BP 130/74 | HR 60 | Ht 66.0 in | Wt 187.0 lb

## 2014-08-23 DIAGNOSIS — Z79899 Other long term (current) drug therapy: Secondary | ICD-10-CM

## 2014-08-23 DIAGNOSIS — I951 Orthostatic hypotension: Secondary | ICD-10-CM

## 2014-08-23 DIAGNOSIS — I480 Paroxysmal atrial fibrillation: Secondary | ICD-10-CM

## 2014-08-23 DIAGNOSIS — I10 Essential (primary) hypertension: Secondary | ICD-10-CM

## 2014-08-23 LAB — BASIC METABOLIC PANEL
BUN: 14 mg/dL (ref 6–23)
CALCIUM: 9.4 mg/dL (ref 8.4–10.5)
CHLORIDE: 105 meq/L (ref 96–112)
CO2: 24 mEq/L (ref 19–32)
CREATININE: 1.3 mg/dL — AB (ref 0.4–1.2)
GFR: 53.29 mL/min — AB (ref >60.00)
Glucose, Bld: 129 mg/dL — ABNORMAL HIGH (ref 70–99)
Potassium: 4.1 mEq/L (ref 3.5–5.1)
Sodium: 138 mEq/L (ref 135–145)

## 2014-08-23 NOTE — Assessment & Plan Note (Signed)
Related to UTI , dehydration and DM improved F/U Dr Parke SimmersBland UA checked and she was given 2nd round of antibiotics

## 2014-08-23 NOTE — Assessment & Plan Note (Signed)
Maint NSR  On tikosyn  Check BMET today as she is on kdur and no diuretic

## 2014-08-23 NOTE — Assessment & Plan Note (Signed)
Well controlled.  Continue current medications and low sodium Dash type diet.   At some point should be back on ARB for DM Currently just on coreg for PAF due to hypotensive episode

## 2014-08-23 NOTE — Patient Instructions (Signed)
Your physician wants you to follow-up in:   3 MONTHS WITH  DR Haywood FillerNISHAN  You will receive a reminder letter in the mail two months in advance. If you don't receive a letter, please call our office to schedule the follow-up appointment. Your physician recommends that you continue on your current medications as directed. Please refer to the Current Medication list given to you today. Your physician recommends that you return for lab work in: TODAY   BMET

## 2014-09-05 ENCOUNTER — Emergency Department (HOSPITAL_COMMUNITY): Payer: Medicaid Other

## 2014-09-05 ENCOUNTER — Inpatient Hospital Stay (HOSPITAL_COMMUNITY)
Admission: EM | Admit: 2014-09-05 | Discharge: 2014-09-11 | DRG: 308 | Disposition: A | Discharge status: Home Health | Payer: Medicaid Other | Attending: Internal Medicine | Admitting: Internal Medicine

## 2014-09-05 ENCOUNTER — Encounter (HOSPITAL_COMMUNITY): Payer: Self-pay | Admitting: *Deleted

## 2014-09-05 DIAGNOSIS — K859 Acute pancreatitis, unspecified: Secondary | ICD-10-CM | POA: Diagnosis present

## 2014-09-05 DIAGNOSIS — I10 Essential (primary) hypertension: Secondary | ICD-10-CM

## 2014-09-05 DIAGNOSIS — G8929 Other chronic pain: Secondary | ICD-10-CM

## 2014-09-05 DIAGNOSIS — Z8249 Family history of ischemic heart disease and other diseases of the circulatory system: Secondary | ICD-10-CM

## 2014-09-05 DIAGNOSIS — R112 Nausea with vomiting, unspecified: Secondary | ICD-10-CM | POA: Diagnosis present

## 2014-09-05 DIAGNOSIS — E785 Hyperlipidemia, unspecified: Secondary | ICD-10-CM

## 2014-09-05 DIAGNOSIS — K580 Irritable bowel syndrome with diarrhea: Secondary | ICD-10-CM | POA: Diagnosis present

## 2014-09-05 DIAGNOSIS — Z833 Family history of diabetes mellitus: Secondary | ICD-10-CM | POA: Diagnosis not present

## 2014-09-05 DIAGNOSIS — K861 Other chronic pancreatitis: Secondary | ICD-10-CM | POA: Diagnosis present

## 2014-09-05 DIAGNOSIS — Z8719 Personal history of other diseases of the digestive system: Secondary | ICD-10-CM

## 2014-09-05 DIAGNOSIS — G40909 Epilepsy, unspecified, not intractable, without status epilepticus: Secondary | ICD-10-CM

## 2014-09-05 DIAGNOSIS — K219 Gastro-esophageal reflux disease without esophagitis: Secondary | ICD-10-CM | POA: Diagnosis present

## 2014-09-05 DIAGNOSIS — Z87440 Personal history of urinary (tract) infections: Secondary | ICD-10-CM | POA: Diagnosis not present

## 2014-09-05 DIAGNOSIS — I252 Old myocardial infarction: Secondary | ICD-10-CM

## 2014-09-05 DIAGNOSIS — I480 Paroxysmal atrial fibrillation: Principal | ICD-10-CM | POA: Diagnosis present

## 2014-09-05 DIAGNOSIS — I129 Hypertensive chronic kidney disease with stage 1 through stage 4 chronic kidney disease, or unspecified chronic kidney disease: Secondary | ICD-10-CM | POA: Diagnosis present

## 2014-09-05 DIAGNOSIS — R197 Diarrhea, unspecified: Secondary | ICD-10-CM

## 2014-09-05 DIAGNOSIS — E872 Acidosis: Secondary | ICD-10-CM | POA: Diagnosis present

## 2014-09-05 DIAGNOSIS — I4891 Unspecified atrial fibrillation: Secondary | ICD-10-CM

## 2014-09-05 DIAGNOSIS — E118 Type 2 diabetes mellitus with unspecified complications: Secondary | ICD-10-CM

## 2014-09-05 DIAGNOSIS — N183 Chronic kidney disease, stage 3 (moderate): Secondary | ICD-10-CM | POA: Diagnosis present

## 2014-09-05 DIAGNOSIS — F329 Major depressive disorder, single episode, unspecified: Secondary | ICD-10-CM | POA: Diagnosis present

## 2014-09-05 DIAGNOSIS — R001 Bradycardia, unspecified: Secondary | ICD-10-CM | POA: Diagnosis not present

## 2014-09-05 DIAGNOSIS — M797 Fibromyalgia: Secondary | ICD-10-CM | POA: Diagnosis present

## 2014-09-05 DIAGNOSIS — E1165 Type 2 diabetes mellitus with hyperglycemia: Secondary | ICD-10-CM | POA: Diagnosis present

## 2014-09-05 DIAGNOSIS — R079 Chest pain, unspecified: Secondary | ICD-10-CM | POA: Diagnosis present

## 2014-09-05 DIAGNOSIS — Z7901 Long term (current) use of anticoagulants: Secondary | ICD-10-CM

## 2014-09-05 DIAGNOSIS — W19XXXA Unspecified fall, initial encounter: Secondary | ICD-10-CM

## 2014-09-05 DIAGNOSIS — E669 Obesity, unspecified: Secondary | ICD-10-CM

## 2014-09-05 DIAGNOSIS — Z79899 Other long term (current) drug therapy: Secondary | ICD-10-CM

## 2014-09-05 DIAGNOSIS — R072 Precordial pain: Secondary | ICD-10-CM

## 2014-09-05 DIAGNOSIS — J45909 Unspecified asthma, uncomplicated: Secondary | ICD-10-CM | POA: Diagnosis present

## 2014-09-05 DIAGNOSIS — Z8041 Family history of malignant neoplasm of ovary: Secondary | ICD-10-CM

## 2014-09-05 DIAGNOSIS — I951 Orthostatic hypotension: Secondary | ICD-10-CM

## 2014-09-05 DIAGNOSIS — R109 Unspecified abdominal pain: Secondary | ICD-10-CM

## 2014-09-05 DIAGNOSIS — N179 Acute kidney failure, unspecified: Secondary | ICD-10-CM

## 2014-09-05 DIAGNOSIS — D869 Sarcoidosis, unspecified: Secondary | ICD-10-CM

## 2014-09-05 DIAGNOSIS — K449 Diaphragmatic hernia without obstruction or gangrene: Secondary | ICD-10-CM | POA: Diagnosis present

## 2014-09-05 DIAGNOSIS — Z86010 Personal history of colonic polyps: Secondary | ICD-10-CM | POA: Diagnosis not present

## 2014-09-05 DIAGNOSIS — R0789 Other chest pain: Secondary | ICD-10-CM

## 2014-09-05 DIAGNOSIS — R059 Cough, unspecified: Secondary | ICD-10-CM

## 2014-09-05 DIAGNOSIS — Z981 Arthrodesis status: Secondary | ICD-10-CM | POA: Diagnosis not present

## 2014-09-05 DIAGNOSIS — Z794 Long term (current) use of insulin: Secondary | ICD-10-CM

## 2014-09-05 DIAGNOSIS — N390 Urinary tract infection, site not specified: Secondary | ICD-10-CM

## 2014-09-05 DIAGNOSIS — IMO0001 Reserved for inherently not codable concepts without codable children: Secondary | ICD-10-CM | POA: Diagnosis present

## 2014-09-05 DIAGNOSIS — K589 Irritable bowel syndrome without diarrhea: Secondary | ICD-10-CM

## 2014-09-05 DIAGNOSIS — R55 Syncope and collapse: Secondary | ICD-10-CM

## 2014-09-05 DIAGNOSIS — G40802 Other epilepsy, not intractable, without status epilepticus: Secondary | ICD-10-CM | POA: Diagnosis present

## 2014-09-05 DIAGNOSIS — R1013 Epigastric pain: Secondary | ICD-10-CM

## 2014-09-05 DIAGNOSIS — E876 Hypokalemia: Secondary | ICD-10-CM | POA: Diagnosis not present

## 2014-09-05 DIAGNOSIS — R05 Cough: Secondary | ICD-10-CM

## 2014-09-05 DIAGNOSIS — R0602 Shortness of breath: Secondary | ICD-10-CM | POA: Diagnosis not present

## 2014-09-05 DIAGNOSIS — G629 Polyneuropathy, unspecified: Secondary | ICD-10-CM | POA: Diagnosis present

## 2014-09-05 DIAGNOSIS — IMO0002 Reserved for concepts with insufficient information to code with codable children: Secondary | ICD-10-CM

## 2014-09-05 DIAGNOSIS — E119 Type 2 diabetes mellitus without complications: Secondary | ICD-10-CM

## 2014-09-05 DIAGNOSIS — N200 Calculus of kidney: Secondary | ICD-10-CM

## 2014-09-05 HISTORY — DX: Essential (primary) hypertension: I10

## 2014-09-05 LAB — URINALYSIS, ROUTINE W REFLEX MICROSCOPIC
Bilirubin Urine: NEGATIVE
Glucose, UA: NEGATIVE mg/dL
Hgb urine dipstick: NEGATIVE
KETONES UR: NEGATIVE mg/dL
NITRITE: NEGATIVE
Protein, ur: NEGATIVE mg/dL
Specific Gravity, Urine: 1.007 (ref 1.005–1.030)
UROBILINOGEN UA: 0.2 mg/dL (ref 0.0–1.0)
pH: 5 (ref 5.0–8.0)

## 2014-09-05 LAB — CBC
HEMATOCRIT: 43.4 % (ref 36.0–46.0)
Hemoglobin: 14.8 g/dL (ref 12.0–15.0)
MCH: 30.3 pg (ref 26.0–34.0)
MCHC: 34.1 g/dL (ref 30.0–36.0)
MCV: 88.8 fL (ref 78.0–100.0)
Platelets: 183 10*3/uL (ref 150–400)
RBC: 4.89 MIL/uL (ref 3.87–5.11)
RDW: 12.7 % (ref 11.5–15.5)
WBC: 5.6 10*3/uL (ref 4.0–10.5)

## 2014-09-05 LAB — BASIC METABOLIC PANEL
Anion gap: 18 — ABNORMAL HIGH (ref 5–15)
BUN: 13 mg/dL (ref 6–23)
CHLORIDE: 102 meq/L (ref 96–112)
CO2: 20 mmol/L (ref 19–32)
CREATININE: 1.97 mg/dL — AB (ref 0.50–1.10)
Calcium: 10.5 mg/dL (ref 8.4–10.5)
GFR, EST AFRICAN AMERICAN: 30 mL/min — AB (ref >90)
GFR, EST NON AFRICAN AMERICAN: 26 mL/min — AB (ref >90)
Glucose, Bld: 157 mg/dL — ABNORMAL HIGH (ref 70–99)
Potassium: 3.6 mmol/L (ref 3.5–5.1)
Sodium: 140 mmol/L (ref 135–145)

## 2014-09-05 LAB — I-STAT TROPONIN, ED: Troponin i, poc: 0.01 ng/mL (ref 0.00–0.08)

## 2014-09-05 LAB — TROPONIN I
Troponin I: 0.03 ng/mL (ref <0.031)
Troponin I: 0.03 ng/mL (ref <0.031)

## 2014-09-05 LAB — URINE MICROSCOPIC-ADD ON

## 2014-09-05 LAB — PROTIME-INR
INR: 0.91 (ref 0.00–1.49)
PROTHROMBIN TIME: 12.3 s (ref 11.6–15.2)

## 2014-09-05 LAB — MAGNESIUM: Magnesium: 2 mg/dL (ref 1.5–2.5)

## 2014-09-05 LAB — GLUCOSE, CAPILLARY: Glucose-Capillary: 150 mg/dL — ABNORMAL HIGH (ref 70–99)

## 2014-09-05 LAB — BRAIN NATRIURETIC PEPTIDE: B Natriuretic Peptide: 196.8 pg/mL — ABNORMAL HIGH (ref 0.0–100.0)

## 2014-09-05 LAB — MRSA PCR SCREENING: MRSA BY PCR: NEGATIVE

## 2014-09-05 MED ORDER — SODIUM CHLORIDE 0.9 % IV SOLN
INTRAVENOUS | Status: DC
  Administered 2014-09-05 – 2014-09-06 (×2): via INTRAVENOUS
  Administered 2014-09-08: 50 mL/h via INTRAVENOUS
  Administered 2014-09-09 – 2014-09-10 (×2): via INTRAVENOUS
  Administered 2014-09-11: 1000 mL via INTRAVENOUS
  Administered 2014-09-11: via INTRAVENOUS

## 2014-09-05 MED ORDER — SODIUM CHLORIDE 0.9 % IV BOLUS (SEPSIS)
1000.0000 mL | Freq: Once | INTRAVENOUS | Status: AC
  Administered 2014-09-05: 1000 mL via INTRAVENOUS

## 2014-09-05 MED ORDER — PANTOPRAZOLE SODIUM 40 MG PO TBEC
80.0000 mg | DELAYED_RELEASE_TABLET | Freq: Every day | ORAL | Status: DC
  Administered 2014-09-06 – 2014-09-11 (×6): 80 mg via ORAL
  Filled 2014-09-05 (×6): qty 2

## 2014-09-05 MED ORDER — MORPHINE SULFATE 2 MG/ML IJ SOLN
2.0000 mg | INTRAMUSCULAR | Status: DC | PRN
  Administered 2014-09-07: 2 mg via INTRAVENOUS
  Filled 2014-09-05: qty 1

## 2014-09-05 MED ORDER — METFORMIN HCL 500 MG PO TABS: 500.0000 mg | ORAL_TABLET | Freq: Two times a day (BID) | ORAL | Status: DC

## 2014-09-05 MED ORDER — OXYCODONE-ACETAMINOPHEN 5-325 MG PO TABS
1.0000 | ORAL_TABLET | Freq: Three times a day (TID) | ORAL | Status: DC | PRN
  Administered 2014-09-06 – 2014-09-11 (×13): 1 via ORAL
  Filled 2014-09-05 (×14): qty 1

## 2014-09-05 MED ORDER — ASPIRIN 325 MG PO TABS
325.0000 mg | ORAL_TABLET | ORAL | Status: AC
  Administered 2014-09-05: 325 mg via ORAL
  Filled 2014-09-05: qty 1

## 2014-09-05 MED ORDER — NIACIN ER (ANTIHYPERLIPIDEMIC) 500 MG PO TBCR
500.0000 mg | EXTENDED_RELEASE_TABLET | Freq: Every day | ORAL | Status: DC
  Administered 2014-09-05 – 2014-09-10 (×6): 500 mg via ORAL
  Filled 2014-09-05 (×9): qty 1

## 2014-09-05 MED ORDER — DICLOFENAC SODIUM 1 % TD GEL
2.0000 g | Freq: Four times a day (QID) | TRANSDERMAL | Status: DC | PRN
  Administered 2014-09-06 – 2014-09-10 (×6): 2 g via TOPICAL
  Filled 2014-09-05 (×2): qty 100

## 2014-09-05 MED ORDER — ALBUTEROL SULFATE HFA 108 (90 BASE) MCG/ACT IN AERS: 2.0000 | INHALATION_SPRAY | RESPIRATORY_TRACT | Status: DC | PRN

## 2014-09-05 MED ORDER — DILTIAZEM LOAD VIA INFUSION
10.0000 mg | Freq: Once | INTRAVENOUS | Status: AC
  Administered 2014-09-05: 10 mg via INTRAVENOUS
  Filled 2014-09-05: qty 10

## 2014-09-05 MED ORDER — PREGABALIN 100 MG PO CAPS
100.0000 mg | ORAL_CAPSULE | Freq: Three times a day (TID) | ORAL | Status: DC
  Administered 2014-09-05 – 2014-09-11 (×17): 100 mg via ORAL
  Filled 2014-09-05 (×17): qty 1

## 2014-09-05 MED ORDER — CARVEDILOL 12.5 MG PO TABS
12.5000 mg | ORAL_TABLET | Freq: Two times a day (BID) | ORAL | Status: DC
  Administered 2014-09-06 – 2014-09-08 (×5): 12.5 mg via ORAL
  Filled 2014-09-05 (×5): qty 1

## 2014-09-05 MED ORDER — SODIUM CHLORIDE 0.9 % IV SOLN
INTRAVENOUS | Status: DC
  Administered 2014-09-05: 18:00:00 via INTRAVENOUS

## 2014-09-05 MED ORDER — NITROGLYCERIN 0.4 MG SL SUBL: 0.4000 mg | SUBLINGUAL_TABLET | SUBLINGUAL | Status: DC | PRN

## 2014-09-05 MED ORDER — MOMETASONE FURO-FORMOTEROL FUM 200-5 MCG/ACT IN AERO
2.0000 | INHALATION_SPRAY | Freq: Two times a day (BID) | RESPIRATORY_TRACT | Status: DC
  Administered 2014-09-06 – 2014-09-11 (×9): 2 via RESPIRATORY_TRACT
  Filled 2014-09-05: qty 8.8

## 2014-09-05 MED ORDER — DIPHENHYDRAMINE HCL 50 MG/ML IJ SOLN
25.0000 mg | Freq: Once | INTRAMUSCULAR | Status: AC
  Administered 2014-09-05: 25 mg via INTRAVENOUS
  Filled 2014-09-05: qty 1

## 2014-09-05 MED ORDER — RIVAROXABAN 15 MG PO TABS: 15.0000 mg | ORAL_TABLET | Freq: Every day | ORAL | Status: DC

## 2014-09-05 MED ORDER — DILTIAZEM HCL 100 MG IV SOLR
5.0000 mg/h | INTRAVENOUS | Status: DC
  Administered 2014-09-05: 5 mg/h via INTRAVENOUS

## 2014-09-05 MED ORDER — ACETAMINOPHEN 500 MG PO TABS
1000.0000 mg | ORAL_TABLET | Freq: Once | ORAL | Status: AC
  Administered 2014-09-05: 1000 mg via ORAL
  Filled 2014-09-05: qty 2

## 2014-09-05 MED ORDER — FERROUS SULFATE 325 (65 FE) MG PO TABS
325.0000 mg | ORAL_TABLET | Freq: Every day | ORAL | Status: DC
  Administered 2014-09-06 – 2014-09-11 (×6): 325 mg via ORAL
  Filled 2014-09-05 (×6): qty 1

## 2014-09-05 MED ORDER — INSULIN GLARGINE 100 UNIT/ML ~~LOC~~ SOLN
40.0000 [IU] | Freq: Every day | SUBCUTANEOUS | Status: DC
  Administered 2014-09-06 – 2014-09-07 (×3): 40 [IU] via SUBCUTANEOUS
  Filled 2014-09-05 (×4): qty 0.4

## 2014-09-05 MED ORDER — ZOLPIDEM TARTRATE 5 MG PO TABS
5.0000 mg | ORAL_TABLET | Freq: Every evening | ORAL | Status: DC | PRN
  Administered 2014-09-08 – 2014-09-09 (×3): 5 mg via ORAL
  Filled 2014-09-05 (×3): qty 1

## 2014-09-05 MED ORDER — RIVAROXABAN 20 MG PO TABS: 20.0000 mg | ORAL_TABLET | Freq: Every day | ORAL | Status: DC

## 2014-09-05 MED ORDER — LEVETIRACETAM ER 500 MG PO TB24
2000.0000 mg | ORAL_TABLET | Freq: Every day | ORAL | Status: DC
  Administered 2014-09-05 – 2014-09-10 (×6): 2000 mg via ORAL
  Filled 2014-09-05 (×8): qty 4

## 2014-09-05 MED ORDER — INSULIN ASPART 100 UNIT/ML ~~LOC~~ SOLN
0.0000 [IU] | Freq: Three times a day (TID) | SUBCUTANEOUS | Status: DC
  Administered 2014-09-06 – 2014-09-08 (×3): 2 [IU] via SUBCUTANEOUS
  Administered 2014-09-09: 3 [IU] via SUBCUTANEOUS

## 2014-09-05 MED ORDER — EXENATIDE 10 MCG/0.04ML ~~LOC~~ SOPN
10.0000 ug | PEN_INJECTOR | Freq: Every day | SUBCUTANEOUS | Status: DC
Start: 2014-09-05 — End: 2014-09-07

## 2014-09-05 MED ORDER — NITROGLYCERIN 0.4 MG SL SUBL
0.4000 mg | SUBLINGUAL_TABLET | SUBLINGUAL | Status: DC | PRN
  Administered 2014-09-05: 0.4 mg via SUBLINGUAL
  Filled 2014-09-05: qty 1

## 2014-09-05 MED ORDER — DOFETILIDE 250 MCG PO CAPS
250.0000 ug | ORAL_CAPSULE | Freq: Two times a day (BID) | ORAL | Status: DC
  Administered 2014-09-05 – 2014-09-11 (×12): 250 ug via ORAL
  Filled 2014-09-05 (×12): qty 1

## 2014-09-05 MED ORDER — ALBUTEROL SULFATE (2.5 MG/3ML) 0.083% IN NEBU: 2.5000 mg | INHALATION_SOLUTION | RESPIRATORY_TRACT | Status: DC | PRN

## 2014-09-05 MED ORDER — ASPIRIN EC 325 MG PO TBEC
325.0000 mg | DELAYED_RELEASE_TABLET | Freq: Every day | ORAL | Status: DC
  Administered 2014-09-06 – 2014-09-10 (×5): 325 mg via ORAL
  Filled 2014-09-05 (×5): qty 1

## 2014-09-05 MED ORDER — ONDANSETRON HCL 4 MG/2ML IJ SOLN
4.0000 mg | Freq: Once | INTRAMUSCULAR | Status: AC
  Administered 2014-09-05: 4 mg via INTRAVENOUS
  Filled 2014-09-05: qty 2

## 2014-09-05 MED ORDER — ATORVASTATIN CALCIUM 10 MG PO TABS
10.0000 mg | ORAL_TABLET | Freq: Every day | ORAL | Status: DC
  Administered 2014-09-06 – 2014-09-10 (×5): 10 mg via ORAL
  Filled 2014-09-05 (×5): qty 1

## 2014-09-05 MED ORDER — DULOXETINE HCL 60 MG PO CPEP
60.0000 mg | ORAL_CAPSULE | Freq: Every day | ORAL | Status: DC
Start: 2014-09-06 — End: 2014-09-11
  Administered 2014-09-06 – 2014-09-11 (×6): 60 mg via ORAL
  Filled 2014-09-05 (×6): qty 1

## 2014-09-05 MED ORDER — RIVAROXABAN 15 MG PO TABS
15.0000 mg | ORAL_TABLET | Freq: Every day | ORAL | Status: DC
  Administered 2014-09-05: 15 mg via ORAL
  Filled 2014-09-05: qty 1

## 2014-09-05 MED ORDER — VITAMIN D 1000 UNITS PO TABS
1000.0000 [IU] | ORAL_TABLET | Freq: Two times a day (BID) | ORAL | Status: DC
  Administered 2014-09-05 – 2014-09-11 (×12): 1000 [IU] via ORAL
  Filled 2014-09-05 (×12): qty 1

## 2014-09-05 MED ORDER — ONDANSETRON HCL 4 MG/2ML IJ SOLN: 4.0000 mg | Freq: Four times a day (QID) | INTRAMUSCULAR | Status: DC | PRN

## 2014-09-05 MED ORDER — ACETAMINOPHEN 325 MG PO TABS
650.0000 mg | ORAL_TABLET | ORAL | Status: DC | PRN
  Administered 2014-09-08 – 2014-09-10 (×4): 650 mg via ORAL
  Filled 2014-09-05 (×4): qty 2

## 2014-09-05 NOTE — ED Notes (Signed)
Pt reports severe chest pains that started this afternoon with sob and nausea. Pain radiates into left arm, increases with movement.

## 2014-09-05 NOTE — Consult Note (Signed)
Cardiologist: Dr. Johnsie Cancel Reason for Consult: Afib RVR Referring Physician: Dr. Leonides Schanz  HPI: [name] is an 64 y.o. female.with a history of HTN and PAF. On tikosyn. She was hospitalized 5/15with PAF and converted with cardizem Xarelto started. Normal cath in 2012. Echo 5/24 normal EF and only mild LAE.  Her history also includes hyperlipidemia, LVH, depression, fibromyalgia, diabetes mellitus type 2, GERD.  Patient presents feeling nauseated, dizzy, sweaty, short of breath with palpitations. She knew that her heart was back in atrial fibrillation. She reports some chest pressure, lower extremity edema, abdominal and low back pain as well. She denies any vomiting, fever, hematochezia, melena, congestion. She reports being on antibiotics last month for you urinary tract infection. She sleeps on 2 pillows at home. She also reports that she's been drinking a gallon of water per day.    Past Medical History  Diagnosis Date  . Hypertension   . Hyperlipidemia   . LVH (left ventricular hypertrophy)   . Colon polyps   . Diverticulosis   . Gallstones   . IBS (irritable bowel syndrome)   . Kidney stones   . Anginal pain     OCT 2013  . Paroxysmal a-fib     coumadin ( Chads2=2) Echo 09/05/10 EF 55-60%; mod LVH  . Depression   . Asthma     DR Melvyn Novas   . Sarcoidosis of lung     NONACTIVE      SEES DR KZLD  . Fibromyalgia   . Peripheral neuropathy   . Myocardial infarction 2011  . Type II diabetes mellitus   . H/O hiatal hernia   . GERD (gastroesophageal reflux disease)   . Migraine     "@ least once/month; sometimes twice" (01/08/2014)  . Epilepsy   . Arthritis     "joints" (01/08/2014)  . Chronic back pain   . Anxiety     Past Surgical History  Procedure Laterality Date  . Closed reduction ankle fracture Left 10/2006  . Resection of hand neuroma Left 01/2002    'wrist"  . Neuroplasty / transposition median nerve at carpal tunnel Left 2004  . Esophageal manometry  03/21/2012   Procedure: ESOPHAGEAL MANOMETRY (EM);  Surgeon: Milus Banister, MD;  Location: [facility];  Service: Endoscopy;  Laterality: N/A;  . Colon surgery    . Anterior cervical decomp/discectomy fusion  07/11/2012    Procedure: ANTERIOR CERVICAL DECOMPRESSION/DISCECTOMY FUSION 1 LEVEL;  Surgeon: Ophelia Charter, MD;  Location: [place] [facility];  Service: Neurosurgery;  Laterality: N/A;  Cervical Five-Six Anterior Cervical Decompression with Fusion Interbody Prothesis Plating and Bonegraft  . Cholecystectomy  1976  . Neuroplasty / transposition median nerve at carpal tunnel Right 2002  . Abdominal hysterectomy  1995    "partial"  . Salpingoophorectomy Bilateral 2000  . Dilation and curettage of uterus    . Tubal ligation  1982  . Fracture surgery    . Cardiac catheterization  ?2014    Family History  Problem Relation Age of Onset  . Heart attack Mother     @ age 34  . Mental illness Mother   . Diabetes Mother   . Hypertension Mother     siblings  . Heart attack Brother 29  . Colon cancer Maternal Aunt   . Prostate cancer Maternal Grandfather   . Ovarian cancer Maternal Aunt   . Diabetes    . Lupus Sister   . Ovarian cancer Cousin   . Hypertension  Social History:  reports that she has never smoked. She has never used smokeless tobacco. She reports that she does not drink alcohol or use illicit drugs.  Allergies:  Allergies  Allergen Reactions  . Amitriptyline Other (See Comments)    Disoriented.  . Hydromorphone Hcl Other (See Comments)    blood pressure drops.  . Prednisone     SENT INTO AFIB     Medications:  Prior to Admission medications   Medication Sig Start Date End Date Taking? Authorizing Provider  albuterol (PROVENTIL HFA;VENTOLIN HFA) 108 (90 BASE) MCG/ACT inhaler Inhale 2 puffs into the lungs every 4 (four) hours as needed for wheezing or shortness of breath. 10/19/11  Yes Tammy S Parrett, NP  atorvastatin (LIPITOR) 10 MG tablet Take 10 mg by mouth daily.    Yes Historical Provider, MD  BYETTA 10 MCG PEN 10 MCG/0.04ML SOPN injection Inject 10 Units into the skin daily. 06/07/14  Yes Historical Provider, MD  calcium carbonate (OS-CAL) 600 MG TABS Take 1 tablet (600 mg total) by mouth daily. 10/19/11  Yes Tammy S Parrett, NP  carvedilol (COREG) 12.5 MG tablet Take 1 tablet (12.5 mg total) by mouth 2 (two) times daily with a meal. Takes 1.5 tablets . 08/04/14  Yes Barton Dubois, MD  cholecalciferol (VITAMIN D) 1000 UNITS tablet Take 1,000 Units by mouth 2 (two) times daily.    Yes Historical Provider, MD  Cranberry 500 MG CAPS Take 500 mg by mouth 2 (two) times daily.   Yes Historical Provider, MD  docusate sodium (COLACE) 100 MG capsule Take 100 mg by mouth 2 (two) times daily as needed for constipation. For stool softner   Yes Historical Provider, MD  dofetilide ([name]) 250 MCG capsule Take 1 capsule (250 mcg total) by mouth 2 (two) times daily. 03/07/14  Yes Josue Hector, MD  DULERA 200-5 MCG/ACT AERO Inhale 2 puffs into the lungs 2 (two) times daily. 06/07/14  Yes Historical Provider, MD  DULoxetine (CYMBALTA) 60 MG capsule Take 60 mg by mouth daily.   Yes Historical Provider, MD  ferrous sulfate 325 (65 FE) MG tablet Take 325 mg by mouth daily with breakfast.   Yes Historical Provider, MD  LANTUS SOLOSTAR 100 UNIT/ML Solostar Pen Inject 40 Units into the skin daily as needed. Take Only if sugar is over  140 06/07/14  Yes Historical Provider, MD  levETIRAcetam (KEPPRA XR) 500 MG 24 hr tablet Take 4 tablets (2,000 mg total) by mouth at bedtime. 04/30/14  Yes Penni Bombard, MD  loratadine (CLARITIN) 10 MG tablet Take 10 mg by mouth every morning.   Yes Historical Provider, MD  metFORMIN (GLUCOPHAGE) 500 MG tablet Take 500 mg by mouth 2 (two) times daily with a meal.   Yes Historical Provider, MD  NEXIUM 40 MG capsule take 1 capsule by mouth twice a day WITH MEALS   Yes Milus Banister, MD  niacin (NIASPAN) 500 MG CR tablet Take 500 mg by mouth at  bedtime.    Yes Historical Provider, MD  nitroGLYCERIN (NITROSTAT) 0.4 MG SL tablet Place 0.4 mg under the tongue every 5 (five) minutes as needed for chest pain.   Yes Historical Provider, MD  ondansetron (ZOFRAN-ODT) 8 MG disintegrating tablet Take 8 mg by mouth every 6 (six) hours as needed for nausea.   Yes Historical Provider, MD  oxyCODONE-acetaminophen (PERCOCET) 10-325 MG per tablet Take 1 tablet by mouth every 8 (eight) hours as needed for pain. For pain 08/04/14  Yes Barton Dubois,  MD  potassium chloride (K-DUR) 10 MEQ tablet Take 1 tablet (10 mEq total) by mouth daily. 06/17/14  Yes Josue Hector, MD  pregabalin (LYRICA) 100 MG capsule Take 100 mg by mouth 3 (three) times daily.   Yes Historical Provider, MD  rivaroxaban (XARELTO) 20 MG TABS tablet Take 1 tablet (20 mg total) by mouth daily with supper. 01/08/14  Yes Lendon Colonel, NP  vitamin C (ASCORBIC ACID) 500 MG tablet Take 500 mg by mouth 2 (two) times daily.   Yes Historical Provider, MD  VOLTAREN 1 % GEL Apply 2 g topically 4 (four) times daily as needed. For pain 08/13/14  Yes Historical Provider, MD  cefUROXime (CEFTIN) 500 MG tablet Take 1 tablet (500 mg total) by mouth 2 (two) times daily with a meal. Patient not taking: Reported on 09/05/2014 08/04/14   Barton Dubois, MD  gabapentin (NEURONTIN) 100 MG capsule Take 1 capsule (100 mg total) by mouth 3 (three) times daily. Patient not taking: Reported on 09/05/2014 08/04/14   Barton Dubois, MD     Results for orders placed or performed during the hospital encounter of 09/05/14 (from the past 48 hour(s))  BNP (order ONLY if patient complains of dyspnea/SOB AND you have documented it for THIS visit)     Status: Abnormal   Collection Time: 09/05/14  4:36 PM  Result Value Ref Range   B Natriuretic Peptide 196.8 (H) 0.0 - 100.0 pg/mL    Comment: Please note change in reference range.  Basic metabolic panel     Status: Abnormal   Collection Time: 09/05/14  4:36 PM  Result  Value Ref Range   Sodium 140 135 - 145 mmol/L    Comment: Please note change in reference range.   Potassium 3.6 3.5 - 5.1 mmol/L    Comment: Please note change in reference range.   Chloride 102 96 - 112 mEq/L   CO2 20 19 - 32 mmol/L   Glucose, Bld 157 (H) 70 - 99 mg/dL   BUN 13 6 - 23 mg/dL   Creatinine, Ser 1.97 (H) 0.50 - 1.10 mg/dL   Calcium 10.5 8.4 - 10.5 mg/dL   GFR calc non Af Amer 26 (L) >90 mL/min   GFR calc Af Amer 30 (L) >90 mL/min    Comment: (NOTE) The eGFR has been calculated using the CKD EPI equation. This calculation has not been validated in all clinical situations. eGFR's persistently <90 mL/min signify possible Chronic Kidney Disease.    Anion gap 18 (H) 5 - 15  CBC     Status: None   Collection Time: 09/05/14  4:36 PM  Result Value Ref Range   WBC 5.6 4.0 - 10.5 K/uL   RBC 4.89 3.87 - 5.11 MIL/uL   Hemoglobin 14.8 12.0 - 15.0 g/dL   HCT 43.4 36.0 - 46.0 %   MCV 88.8 78.0 - 100.0 fL   MCH 30.3 26.0 - 34.0 pg   MCHC 34.1 30.0 - 36.0 g/dL   RDW 12.7 11.5 - 15.5 %   Platelets 183 150 - 400 K/uL  Protime-INR (if pt is taking Coumadin)     Status: None   Collection Time: 09/05/14  4:36 PM  Result Value Ref Range   Prothrombin Time 12.3 11.6 - 15.2 seconds   INR 0.91 0.00 - 1.49  Magnesium     Status: None   Collection Time: 09/05/14  4:36 PM  Result Value Ref Range   Magnesium 2.0 1.5 - 2.5 mg/dL  I-stat  troponin, ED (not at [facility])     Status: None   Collection Time: 09/05/14  5:02 PM  Result Value Ref Range   Troponin i, poc 0.01 0.00 - 0.08 ng/mL   Comment 3            Comment: Due to the release kinetics of cTnI, a negative result within the first hours of the onset of symptoms does not rule out myocardial infarction with certainty. If myocardial infarction is still suspected, repeat the test at appropriate intervals.     Dg Chest Portable 1 View  09/05/2014   CLINICAL DATA:  Chest pain. Shortness of breath. Nausea. Sarcoidosis.  EXAM:  PORTABLE CHEST - 1 VIEW  COMPARISON:  08/02/2014  FINDINGS: Mild fullness of the left hilum. Otherwise the chest appears unremarkable. The lungs are clear.  IMPRESSION: 1. Mild fullness of the left hilum likely representing adenopathy in the setting of known sarcoidosis. Otherwise, no significant abnormalities are observed.   Electronically Signed   By: Sherryl Barters M.D.   On: 09/05/2014 17:37    Review of Systems  Constitutional: Positive for diaphoresis. Negative for fever.  HENT: Negative for congestion.   Respiratory: Positive for cough and shortness of breath. Negative for wheezing.   Cardiovascular: Positive for chest pain, palpitations, orthopnea and leg swelling. Negative for PND.  Gastrointestinal: Positive for nausea and abdominal pain. Negative for vomiting, blood in stool and melena.  Genitourinary: Negative for hematuria.  Musculoskeletal: Positive for back pain ( low back).  Neurological: Positive for dizziness.  All other systems reviewed and are negative.  Blood pressure 116/77, pulse 106, temperature 97.6 F (36.4 C), temperature source Oral, resp. rate 19, SpO2 98 %. Physical Exam  Constitutional: She is oriented to person, place, and time. She appears well-developed and well-nourished. No distress.  HENT:  Head: Normocephalic and atraumatic.  Mouth/Throat: No oropharyngeal exudate.  Eyes: EOM are normal. Pupils are equal, round, and reactive to light. No scleral icterus.  Neck: Normal range of motion. Neck supple.  Cardiovascular: Normal rate, S1 normal and S2 normal.  An irregularly irregular rhythm present.  No murmur heard. Pulses:      Carotid pulses are 2+ on the right side, and 2+ on the left side.      Dorsalis pedis pulses are 2+ on the right side, and 2+ on the left side.  Respiratory: Effort normal and breath sounds normal. She has no wheezes. She has no rales.  GI: Soft. Bowel sounds are normal. She exhibits no distension. There is no tenderness.    Musculoskeletal: She exhibits no edema.  Neurological: She is alert and oriented to person, place, and time. She exhibits normal muscle tone.  Skin: Skin is warm and dry.  Psychiatric: She has a normal mood and affect.    Assessment/Plan: Active Problems:   Diabetes mellitus   Essential hypertension   Sarcoid   Asthma   Atrial fibrillation with RVR   Chest pain   Acute on CKD Stage 3  Plan:  Continue IV Cardizem for rate control which is already helping.  Hopefully she'll spontaneously convert back to sinus rhythm. Continue [name] and Xarelto.   Tarri Fuller, [place]. 09/05/2014, 7:16 PM    Attending note:  Patient seen and examined. Reviewed records including hospitalization in December 2015. Case discussed with Mr. Lucia Gaskins. Ms. Carmon has a history of paroxysmal atrial fibrillation, treated with [name], Coreg, and low-dose Cardizem. She is anticoagulated with Xarelto. She has no clearly documented history of ischemic  heart disease and normal LVEF. Hospital admission in December was related to orthostatic hypertension, UTI, and acute on chronic renal insufficiency. She presents to the ER stating that she has felt more short of breath and sensed palpitations consistent with her atrial fibrillation. Last documented episode of atrial fibrillation was in May 2015, at which time she spontaneously converted to sinus rhythm with heart rate control on diltiazem. She denies ever having had a cardioversion. Over the last month she has been on intermittent antibiotics related to treatment of UTI, states that she had trouble with nausea, emesis, and diarrhea over the last week as well, her creatinine has bumped up to 1.9. Otherwise no chest pain symptoms. On examination she is relatively comfortable at present, lungs are clear, cardiac exam with irregularly irregular rhythm and no gallop, no peripheral edema. Presenting ECG showed rapid atrial fibrillation at 160 bpm with nonspecific ST-T changes,  difficult to assess QTc. Potassium and magnesium levels are normal. Heart rate has come down intravenous diltiazem, and she is being admitted to the hospitalist team for further evaluation. At this point would recommend continuing her baseline cardiac medical regimen with intravenous diltiazem as well, ultimately she will probably convert back to sinus rhythm spontaneously which has been the case over time with rate control. Follow-up ECG in the morning, cycle cardiac markers. Our service will follow with you. If she does not convert spontaneously to sinus rhythm, cardioversion could always be considered.  Satira Sark, M.D., F.A.C.C.

## 2014-09-05 NOTE — ED Provider Notes (Signed)
TIME SEEN: 4:30 PM  CHIEF COMPLAINT: Chest pain, shortness of breath, nausea, lightheadedness, diaphoresis, palpitations  HPI: Pt is a 64 y.o. female with history of sarcoidosis, hypertension, hyperlipidemia, paroxysmal atrial fibrillation on Xarelto, diabetes who presents emergency department with palpitations, chest pain, shortness of breath, nausea, lightheadedness, diaphoresis that started around 3 PM today. Patient reports that she is on [name] to control her heart rate. She has not missed any doses. She states she has recently been on multiple antibiotics for urinary tract infection.  Took her last dose yesterday. Denies any fever, vomiting. Has had some minimal diarrhea. No bloody stools or melena.  No dysuria, hematuria, urinary frequency or urgency.  ROS: See HPI Constitutional: no fever  Eyes: no drainage  ENT: no runny nose   Cardiovascular:  chest pain  Resp: SOB  GI: no vomiting GU: no dysuria Integumentary: no rash  Allergy: no hives  Musculoskeletal: no leg swelling  Neurological: no slurred speech ROS otherwise negative  PAST MEDICAL HISTORY/PAST SURGICAL HISTORY:  Past Medical History  Diagnosis Date  . Hypertension   . Hyperlipidemia   . LVH (left ventricular hypertrophy)   . Colon polyps   . Diverticulosis   . Gallstones   . IBS (irritable bowel syndrome)   . Kidney stones   . Anginal pain     OCT 2013  . Paroxysmal a-fib     coumadin ( Chads2=2) Echo 09/05/10 EF 55-60%; mod LVH  . Depression   . 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 Sarcoidosis of lung     NONACTIVE      SEES DR ZOXW  . Fibromyalgia   . Peripheral neuropathy   . Myocardial infarction 2011  . Type II diabetes mellitus   . H/O hiatal hernia   . GERD (gastroesophageal reflux disease)   . Migraine     "@ least once/month; sometimes twice" (01/08/2014)  . Epilepsy   . Arthritis     "joints" (01/08/2014)  . Chronic back pain   . Anxiety     MEDICATIONS:  Prior to Admission medications    Medication Sig Start Date End Date Taking? Authorizing Provider  albuterol (PROVENTIL HFA;VENTOLIN HFA) 108 (90 BASE) MCG/ACT inhaler Inhale 2 puffs into the lungs every 4 (four) hours as needed for wheezing or shortness of breath. 10/19/11   Tammy S Parrett, NP  atorvastatin (LIPITOR) 10 MG tablet Take 10 mg by mouth daily.    Historical Provider, MD  BYETTA 10 MCG PEN 10 MCG/0.04ML SOPN injection Inject 10 Units into the skin daily. 06/07/14   Historical Provider, MD  calcium carbonate (OS-CAL) 600 MG TABS Take 1 tablet (600 mg total) by mouth daily. 10/19/11   Tammy S Parrett, NP  carvedilol (COREG) 12.5 MG tablet Take 1 tablet (12.5 mg total) by mouth 2 (two) times daily with a meal. Takes 1.5 tablets . 08/04/14   Vassie Loll, MD  cefUROXime (CEFTIN) 500 MG tablet Take 1 tablet (500 mg total) by mouth 2 (two) times daily with a meal. 08/04/14   Vassie Loll, MD  cholecalciferol (VITAMIN D) 1000 UNITS tablet Take 1,000 Units by mouth 2 (two) times daily.     Historical Provider, MD  Cranberry 500 MG CAPS Take 500 mg by mouth 2 (two) times daily.    Historical Provider, MD  docusate sodium (COLACE) 100 MG capsule Take 100 mg by mouth 2 (two) times daily as needed for constipation. For stool softner    Historical Provider, MD  dofetilide (  [name]) 250 MCG capsule Take 1 capsule (250 mcg total) by mouth 2 (two) times daily. 03/07/14   Wendall Stade, MD  DULERA 200-5 MCG/ACT AERO Inhale 2 puffs into the lungs 2 (two) times daily. 06/07/14   Historical Provider, MD  DULoxetine (CYMBALTA) 60 MG capsule Take 60 mg by mouth daily.    Historical Provider, MD  ferrous sulfate 325 (65 FE) MG tablet Take 325 mg by mouth daily with breakfast.    Historical Provider, MD  gabapentin (NEURONTIN) 100 MG capsule Take 1 capsule (100 mg total) by mouth 3 (three) times daily. 08/04/14   Vassie Loll, MD  LANTUS SOLOSTAR 100 UNIT/ML Solostar Pen Inject 40 Units into the skin at bedtime. 06/07/14   Historical Provider,  MD  levETIRAcetam (KEPPRA XR) 500 MG 24 hr tablet Take 4 tablets (2,000 mg total) by mouth at bedtime. 04/30/14   Suanne Marker, MD  loratadine (CLARITIN) 10 MG tablet Take 10 mg by mouth every morning.    Historical Provider, MD  metFORMIN (GLUCOPHAGE) 500 MG tablet Take 500 mg by mouth 2 (two) times daily with a meal.    Historical Provider, MD  NEXIUM 40 MG capsule take 1 capsule by mouth twice a day WITH MEALS    [name], MD  niacin (NIASPAN) 500 MG CR tablet Take 500 mg by mouth at bedtime.     Historical Provider, MD  nitroGLYCERIN (NITROSTAT) 0.4 MG SL tablet Place 0.4 mg under the tongue every 5 (five) minutes as needed for chest pain.    Historical Provider, MD  ondansetron (ZOFRAN-ODT) 8 MG disintegrating tablet Take 8 mg by mouth every 6 (six) hours as needed for nausea.    Historical Provider, MD  oxyCODONE-acetaminophen (PERCOCET) 10-325 MG per tablet Take 1 tablet by mouth every 8 (eight) hours as needed for pain. For pain 08/04/14   Vassie Loll, MD  potassium chloride (K-DUR) 10 MEQ tablet Take 1 tablet (10 mEq total) by mouth daily. 06/17/14   Wendall Stade, MD  pregabalin (LYRICA) 100 MG capsule Take 100 mg by mouth 3 (three) times daily.    Historical Provider, MD  rivaroxaban (XARELTO) 20 MG TABS tablet Take 1 tablet (20 mg total) by mouth daily with supper. 01/08/14   Jodelle Gross, NP  vitamin C (ASCORBIC ACID) 500 MG tablet Take 500 mg by mouth 2 (two) times daily.    Historical Provider, MD  VOLTAREN 1 % GEL Apply 2 inches to affected area 4 times daily as directed 08/13/14   Historical Provider, MD    ALLERGIES:  Allergies  Allergen Reactions  . Amitriptyline Other (See Comments)    Disoriented.  . Hydromorphone Hcl Other (See Comments)    blood pressure drops.  . Prednisone     SENT INTO AFIB     SOCIAL HISTORY:  History  Substance Use Topics  . Smoking status: Never Smoker   . Smokeless tobacco: Never Used  . Alcohol Use: No    FAMILY  HISTORY: Family History  Problem Relation Age of Onset  . Heart attack Mother     @ age 9  . Mental illness Mother   . Diabetes Mother   . Hypertension Mother     siblings  . Heart attack Brother 50  . Colon cancer Maternal Aunt   . Prostate cancer Maternal Grandfather   . Ovarian cancer Maternal Aunt   . Diabetes    . Lupus Sister   . Ovarian cancer Cousin   .  Hypertension      EXAM: BP 113/62 mmHg  Pulse 160  Temp(Src) 97.6 F (36.4 C) (Oral)  Resp 22  SpO2 100% CONSTITUTIONAL: Alert and oriented and responds appropriately to questions. Appears mildly uncomfortable but is not in any distress HEAD: Normocephalic EYES: Conjunctivae clear, PERRL ENT: normal nose; no rhinorrhea; moist mucous membranes; pharynx without lesions noted NECK: Supple, no meningismus, no LAD  CARD: Irregularly irregular; tachycardic, S1 and S2 appreciated; no murmurs, no clicks, no rubs, no gallops RESP: Normal chest excursion without splinting or tachypnea; breath sounds clear and equal bilaterally; no wheezes, no rhonchi, no rales, no hypoxia; speaking full sentences ABD/GI: Normal bowel sounds; non-distended; soft, non-tender, no rebound, no guarding BACK:  The back appears normal and is non-tender to palpation, there is no CVA tenderness EXT: Normal ROM in all joints; non-tender to palpation; no edema; normal capillary refill; no cyanosis    SKIN: Normal color for age and race; warm NEURO: Moves all extremities equally PSYCH: The patient's mood and manner are appropriate. Grooming and personal hygiene are appropriate.  MEDICAL DECISION MAKING: Patient here with atrial fibrillation and RVR. Heart rate in the 160s. Blood pressure is 113/62. She is symptomatic. We'll give aspirin, nitroglycerin, IV fluids, diltiazem. We'll obtain cardiac labs, portable chest x-ray. Patient will need admission.  ED PROGRESS: Heart rate is slowly improving with diltiazem.  Troponin negative. Chest x-ray shows  adenopathy consistent with patient's history of sarcoidosis. We'll discuss with hospitalist for admission for A. fib with RVR. She did initially have some itching and erythema around the IV site after receiving Cardizem bolus. She is doing better after Benadryl and tolerating Cardizem drip without difficulty.  6:44 PM  D/w Dr. Welton FlakesKhan for admission to step down, inpatient. We'll also consult cardiology. Her cardiologist is Dr. Eden EmmsNishan. PCP is Dr. Parke SimmersBland.   6:53 Pm  D/w Dr. Diona BrownerMcDowell with cardiology who will see the patient in consult.   EKG Interpretation  Date/Time:  Wednesday September 05 2014 16:22:20 EST Ventricular Rate:  160 PR Interval:    QRS Duration: 68 QT Interval:  286 QTC Calculation: 466 R Axis:   13 Text Interpretation:  Atrial fibrillation with rapid ventricular response with premature ventricular or aberrantly conducted complexes Nonspecific ST abnormality , probably digitalis effect Abnormal ECG Confirmed by Caeson Filippi,  DO, Kersten Salmons ([id number]) on 09/05/2014 4:38:11 PM         CRITICAL CARE Performed by: Raelyn NumberWARD, Catlyn Shipton N   Total critical care time: 45 minutes  Critical care time was exclusive of separately billable procedures and treating other patients.  Critical care was necessary to treat or prevent imminent or life-threatening deterioration.  Critical care was time spent personally by me on the following activities: development of treatment plan with patient and/or surrogate as well as nursing, discussions with consultants, evaluation of patient's response to treatment, examination of patient, obtaining history from patient or surrogate, ordering and performing treatments and interventions, ordering and review of laboratory studies, ordering and review of radiographic studies, pulse oximetry and re-evaluation of patient's condition.   Layla MawKristen N Brnadon Eoff, DO 09/05/14 [phone]

## 2014-09-05 NOTE — ED Notes (Signed)
Pt got approx 7mg  of Cardizem bolus. Pt c.o itching above IV site. Arm appears red. MD notified. Medication slowed down and will continue to monitor redness.

## 2014-09-05 NOTE — H&P (Addendum)
[facility] History and Physical  Toni RosenthalLinda F [name] WGN:[id number] DOB: 05-Nov-1950 DOA: 09/05/2014  Referring physician: Rochele RaringKristen Ward, DO PCP: Toni PitterBLAND,Toni J, MD   Chief Complaint: Shortness of breath and Chest pain  HPI: [name] is a 64 y.o. female presents with shortness of braeth and chest pain. Patient states that around 330 she developed SOB and also had associated chest pain. Patient states that she took an inhaler which did not seem to help her. Patient also noted her heart rate was elevated. Patient states that she had palpitations. She states that she has some pain still but it is improving. She had taken her medications including [name] and blood thinners as usual this morining. She states the pain seemed to radiate down her left arm. She had some light headedness and also was dizzy. Patient states that her pressure also went down a little. She did not have any syncope. Patient also states that she had been seen by her PCP and was told she might have a UTI and has been on treatment for that.   Review of Systems:  Constitutional:  No weight loss, night sweats, Fevers, chills, fatigue.  HEENT:  No headaches, Difficulty swallowing Cardio-vascular:  ++chest pain, no Orthopnea, PND, +swelling in lower extremities, +dizziness, +palpitations  GI:  +heartburn, +indigestion, abdominal pain, +nausea, vomiting, +diarrhea Resp:  +shortness of breath at rest. no productive cough, No coughing up of blood.+wheezing. Skin:  no rash or lesions.  GU:  no dysuria, change in color of urine, no urgency or frequency Musculoskeletal:  ++ joint pain (knee). No decreased range of motion. ++back pain.  Psych:  No change in mood or affect. No depression or anxiety. No memory loss.   Past Medical History  Diagnosis Date  . Hypertension   . Hyperlipidemia   . LVH (left ventricular hypertrophy)   . Colon polyps   . Diverticulosis   . Gallstones   . IBS (irritable bowel syndrome)   .  Kidney stones   . Anginal pain     OCT 2013  . Paroxysmal a-fib     coumadin ( Chads2=2) Echo 09/05/10 EF 55-60%; mod LVH  . Depression   . Asthma     DR Sherene SiresWERT   . Sarcoidosis of lung     NONACTIVE      SEES DR HQIOWERT  . Fibromyalgia   . Peripheral neuropathy   . Myocardial infarction 2011  . Type II diabetes mellitus   . H/O hiatal hernia   . GERD (gastroesophageal reflux disease)   . Migraine     "@ least once/month; sometimes twice" (01/08/2014)  . Epilepsy   . Arthritis     "joints" (01/08/2014)  . Chronic back pain   . Anxiety    Past Surgical History  Procedure Laterality Date  . Closed reduction ankle fracture Left 10/2006  . Resection of hand neuroma Left 01/2002    'wrist"  . Neuroplasty / transposition median nerve at carpal tunnel Left 2004  . Esophageal manometry  03/21/2012    Procedure: ESOPHAGEAL MANOMETRY (EM);  Surgeon: Rachael Feeaniel P Jacobs, MD;  Location: [facility];  Service: Endoscopy;  Laterality: N/A;  . Colon surgery    . Anterior cervical decomp/discectomy fusion  07/11/2012    Procedure: ANTERIOR CERVICAL DECOMPRESSION/DISCECTOMY FUSION 1 LEVEL;  Surgeon: Cristi LoronJeffrey D Jenkins, MD;  Location: [facility];  Service: Neurosurgery;  Laterality: N/A;  Cervical Five-Six Anterior Cervical Decompression with Fusion Interbody Prothesis Plating and Bonegraft  . Cholecystectomy  1976  .  Neuroplasty / transposition median nerve at carpal tunnel Right 2002  . Abdominal hysterectomy  1995    "partial"  . Salpingoophorectomy Bilateral 2000  . Dilation and curettage of uterus    . Tubal ligation  1982  . Fracture surgery    . Cardiac catheterization  ?2014   Social History:  reports that she has never smoked. She has never used smokeless tobacco. She reports that she does not drink alcohol or use illicit drugs.  Allergies  Allergen Reactions  . Amitriptyline Other (See Comments)    Disoriented.  . Hydromorphone Hcl Other (See Comments)    blood pressure drops.  .  Prednisone     SENT INTO AFIB     Family History  Problem Relation Age of Onset  . Heart attack Mother     @ age 52  . Mental illness Mother   . Diabetes Mother   . Hypertension Mother     siblings  . Heart attack Brother 50  . Colon cancer Maternal Aunt   . Prostate cancer Maternal Grandfather   . Ovarian cancer Maternal Aunt   . Diabetes    . Lupus Sister   . Ovarian cancer Cousin   . Hypertension       Prior to Admission medications   Medication Sig Start Date End Date Taking? Authorizing Provider  albuterol (PROVENTIL HFA;VENTOLIN HFA) 108 (90 BASE) MCG/ACT inhaler Inhale 2 puffs into the lungs every 4 (four) hours as needed for wheezing or shortness of breath. 10/19/11  Yes Tammy S Parrett, NP  atorvastatin (LIPITOR) 10 MG tablet Take 10 mg by mouth daily.   Yes Historical Provider, MD  BYETTA 10 MCG PEN 10 MCG/0.04ML SOPN injection Inject 10 Units into the skin daily. 06/07/14  Yes Historical Provider, MD  calcium carbonate (OS-CAL) 600 MG TABS Take 1 tablet (600 mg total) by mouth daily. 10/19/11  Yes Tammy S Parrett, NP  carvedilol (COREG) 12.5 MG tablet Take 1 tablet (12.5 mg total) by mouth 2 (two) times daily with a meal. Takes 1.5 tablets . 08/04/14  Yes Vassie Loll, MD  cholecalciferol (VITAMIN D) 1000 UNITS tablet Take 1,000 Units by mouth 2 (two) times daily.    Yes Historical Provider, MD  Cranberry 500 MG CAPS Take 500 mg by mouth 2 (two) times daily.   Yes Historical Provider, MD  docusate sodium (COLACE) 100 MG capsule Take 100 mg by mouth 2 (two) times daily as needed for constipation. For stool softner   Yes Historical Provider, MD  dofetilide ([name]) 250 MCG capsule Take 1 capsule (250 mcg total) by mouth 2 (two) times daily. 03/07/14  Yes Wendall Stade, MD  DULERA 200-5 MCG/ACT AERO Inhale 2 puffs into the lungs 2 (two) times daily. 06/07/14  Yes Historical Provider, MD  DULoxetine (CYMBALTA) 60 MG capsule Take 60 mg by mouth daily.   Yes Historical Provider,  MD  ferrous sulfate 325 (65 FE) MG tablet Take 325 mg by mouth daily with breakfast.   Yes Historical Provider, MD  LANTUS SOLOSTAR 100 UNIT/ML Solostar Pen Inject 40 Units into the skin daily as needed. Take Only if sugar is over  140 06/07/14  Yes Historical Provider, MD  levETIRAcetam (KEPPRA XR) 500 MG 24 hr tablet Take 4 tablets (2,000 mg total) by mouth at bedtime. 04/30/14  Yes Suanne Marker, MD  loratadine (CLARITIN) 10 MG tablet Take 10 mg by mouth every morning.   Yes Historical Provider, MD  metFORMIN (GLUCOPHAGE) 500 MG tablet  Take 500 mg by mouth 2 (two) times daily with a meal.   Yes Historical Provider, MD  NEXIUM 40 MG capsule take 1 capsule by mouth twice a day WITH MEALS   Yes [name], MD  niacin (NIASPAN) 500 MG CR tablet Take 500 mg by mouth at bedtime.    Yes Historical Provider, MD  nitroGLYCERIN (NITROSTAT) 0.4 MG SL tablet Place 0.4 mg under the tongue every 5 (five) minutes as needed for chest pain.   Yes Historical Provider, MD  ondansetron (ZOFRAN-ODT) 8 MG disintegrating tablet Take 8 mg by mouth every 6 (six) hours as needed for nausea.   Yes Historical Provider, MD  oxyCODONE-acetaminophen (PERCOCET) 10-325 MG per tablet Take 1 tablet by mouth every 8 (eight) hours as needed for pain. For pain 08/04/14  Yes Vassie Loll, MD  potassium chloride (K-DUR) 10 MEQ tablet Take 1 tablet (10 mEq total) by mouth daily. 06/17/14  Yes Wendall Stade, MD  pregabalin (LYRICA) 100 MG capsule Take 100 mg by mouth 3 (three) times daily.   Yes Historical Provider, MD  rivaroxaban (XARELTO) 20 MG TABS tablet Take 1 tablet (20 mg total) by mouth daily with supper. 01/08/14  Yes Jodelle Gross, NP  vitamin C (ASCORBIC ACID) 500 MG tablet Take 500 mg by mouth 2 (two) times daily.   Yes Historical Provider, MD  VOLTAREN 1 % GEL Apply 2 g topically 4 (four) times daily as needed. For pain 08/13/14  Yes Historical Provider, MD  cefUROXime (CEFTIN) 500 MG tablet Take 1 tablet (500  mg total) by mouth 2 (two) times daily with a meal. Patient not taking: Reported on 09/05/2014 08/04/14   Vassie Loll, MD  gabapentin (NEURONTIN) 100 MG capsule Take 1 capsule (100 mg total) by mouth 3 (three) times daily. Patient not taking: Reported on 09/05/2014 08/04/14   Vassie Loll, MD   Physical Exam: Filed Vitals:   09/05/14 1730 09/05/14 1745 09/05/14 1800 09/05/14 1815  BP: 129/69 132/57 130/60 116/77  Pulse: 98 98 106 106  Temp:      TempSrc:      Resp: SpO2: 100% 99% 98% 98%    Wt Readings from Last 3 Encounters:  08/23/14 84.823 kg (187 lb)  08/02/14 83.462 kg (184 lb)  08/03/14 83.462 kg (184 lb)    General:  Appears calm and comfortable Eyes: PERRL, normal lids, irises & conjunctiva ENT: grossly normal hearing, lips & tongue Neck: no LAD, masses or thyromegaly Cardiovascular: IRR, no m/r/g. trace LE edema. Telemetry: atrial fibrillation Respiratory: CTA bilaterally, no w/r/r. Normal respiratory effort. Abdomen: soft, ntnd Skin: no rash or induration seen on limited exam Musculoskeletal: grossly normal tone BUE/BLE Psychiatric: grossly normal mood and affect, speech fluent and appropriate Neurologic: grossly non-focal.          Labs on Admission:  Basic Metabolic Panel:  Recent Labs Lab 09/05/14 1636  NA 140  K 3.6  CL 102  CO2 20  GLUCOSE 157*  BUN 13  CREATININE 1.97*  CALCIUM 10.5  MG 2.0   Liver Function Tests: No results for input(s): AST, ALT, ALKPHOS, BILITOT, PROT, ALBUMIN in the last 168 hours. No results for input(s): LIPASE, AMYLASE in the last 168 hours. No results for input(s): AMMONIA in the last 168 hours. CBC:  Recent Labs Lab 09/05/14 1636  WBC 5.6  HGB 14.8  HCT 43.4  MCV 88.8  PLT 183   Cardiac Enzymes: No results for input(s): CKTOTAL, CKMB,  CKMBINDEX, TROPONINI in the last 168 hours.  BNP (last 3 results)  Recent Labs  01/06/14 2020 08/02/14 0617  PROBNP 30.9 617.3*   CBG: No results for  input(s): GLUCAP in the last 168 hours.  Radiological Exams on Admission: Dg Chest Portable 1 View  09/05/2014   CLINICAL DATA:  Chest pain. Shortness of breath. Nausea. Sarcoidosis.  EXAM: PORTABLE CHEST - 1 VIEW  COMPARISON:  08/02/2014  FINDINGS: Mild fullness of the left hilum. Otherwise the chest appears unremarkable. The lungs are clear.  IMPRESSION: 1. Mild fullness of the left hilum likely representing adenopathy in the setting of known sarcoidosis. Otherwise, no significant abnormalities are observed.   Electronically Signed   By: Herbie Baltimore M.D.   On: 09/05/2014 17:37     Assessment/Plan Active Problems:   Diabetes mellitus   Essential hypertension   Sarcoid   Asthma   Atrial fibrillation with RVR   Chest pain   1. Atrial Fibrillation with RVR -will be admitted to the step down unit -will start on cardizem drip -cardiology to see patient -continue with xaralto  2. Hypertension -currently pressure controlled -Will monitor closely in the unit  3. Sarcoidosis -currently inactive  4. Asthma -will continue with home medications -oxygen as needed  5. Chest Pain -will check serial enzymes -cardiology to follow  6. Elevated Glucose -will monitor FSBS -insulin sliding scale as needed  7. Elevated Creatinine -will monitor -hydrate as tolerated   Code Status: Full Code (must indicate code status--if unknown or must be presumed, indicate so) DVT Prophylaxis:xaralto Family Communication: Daughter (indicate person spoken with, if applicable, with phone number if by telephone) Disposition Plan: Home (indicate anticipated LOS)  Time spent:  [facility] Pager [phone]

## 2014-09-05 NOTE — ED Notes (Signed)
Attempted report 

## 2014-09-06 ENCOUNTER — Inpatient Hospital Stay (HOSPITAL_COMMUNITY): Payer: Medicaid Other

## 2014-09-06 DIAGNOSIS — R112 Nausea with vomiting, unspecified: Secondary | ICD-10-CM | POA: Diagnosis present

## 2014-09-06 DIAGNOSIS — N179 Acute kidney failure, unspecified: Secondary | ICD-10-CM

## 2014-09-06 DIAGNOSIS — R1084 Generalized abdominal pain: Secondary | ICD-10-CM

## 2014-09-06 DIAGNOSIS — R109 Unspecified abdominal pain: Secondary | ICD-10-CM | POA: Diagnosis present

## 2014-09-06 DIAGNOSIS — I10 Essential (primary) hypertension: Secondary | ICD-10-CM

## 2014-09-06 DIAGNOSIS — R197 Diarrhea, unspecified: Secondary | ICD-10-CM

## 2014-09-06 DIAGNOSIS — Z8719 Personal history of other diseases of the digestive system: Secondary | ICD-10-CM

## 2014-09-06 LAB — BASIC METABOLIC PANEL
ANION GAP: 11 (ref 5–15)
BUN: 11 mg/dL (ref 6–23)
CO2: 22 mmol/L (ref 19–32)
CREATININE: 1.33 mg/dL — AB (ref 0.50–1.10)
Calcium: 9.1 mg/dL (ref 8.4–10.5)
Chloride: 106 mEq/L (ref 96–112)
GFR calc Af Amer: 49 mL/min — ABNORMAL LOW (ref >90)
GFR calc non Af Amer: 42 mL/min — ABNORMAL LOW (ref >90)
Glucose, Bld: 152 mg/dL — ABNORMAL HIGH (ref 70–99)
POTASSIUM: 3.7 mmol/L (ref 3.5–5.1)
Sodium: 139 mmol/L (ref 135–145)

## 2014-09-06 LAB — GLUCOSE, CAPILLARY
Glucose-Capillary: 133 mg/dL — ABNORMAL HIGH (ref 70–99)
Glucose-Capillary: 115 mg/dL — ABNORMAL HIGH (ref 70–99)
Glucose-Capillary: 103 mg/dL — ABNORMAL HIGH (ref 70–99)
Glucose-Capillary: 139 mg/dL — ABNORMAL HIGH (ref 70–99)

## 2014-09-06 LAB — CLOSTRIDIUM DIFFICILE BY PCR: Toxigenic C. Difficile by PCR: NEGATIVE

## 2014-09-06 LAB — TROPONIN I: Troponin I: 0.04 ng/mL — ABNORMAL HIGH (ref <0.031)

## 2014-09-06 MED ORDER — POTASSIUM CHLORIDE CRYS ER 20 MEQ PO TBCR
40.0000 meq | EXTENDED_RELEASE_TABLET | Freq: Every day | ORAL | Status: DC
  Administered 2014-09-07 – 2014-09-11 (×5): 40 meq via ORAL
  Filled 2014-09-06 (×5): qty 2

## 2014-09-06 MED ORDER — IOHEXOL 300 MG/ML  SOLN
25.0000 mL | INTRAMUSCULAR | Status: AC
  Administered 2014-09-06 (×2): 25 mL via ORAL

## 2014-09-06 MED ORDER — POTASSIUM CHLORIDE CRYS ER 20 MEQ PO TBCR
30.0000 meq | EXTENDED_RELEASE_TABLET | Freq: Once | ORAL | Status: AC
  Administered 2014-09-06: 30 meq via ORAL
  Filled 2014-09-06 (×2): qty 1

## 2014-09-06 MED ORDER — RIVAROXABAN 20 MG PO TABS
20.0000 mg | ORAL_TABLET | Freq: Every day | ORAL | Status: DC
  Administered 2014-09-06 – 2014-09-10 (×5): 20 mg via ORAL
  Filled 2014-09-06 (×5): qty 1

## 2014-09-06 MED ORDER — POTASSIUM CHLORIDE ER 10 MEQ PO TBCR
10.0000 meq | EXTENDED_RELEASE_TABLET | Freq: Every day | ORAL | Status: DC
  Administered 2014-09-06: 10 meq via ORAL
  Filled 2014-09-06 (×2): qty 1

## 2014-09-06 MED ORDER — DIPHENHYDRAMINE HCL 25 MG PO CAPS
25.0000 mg | ORAL_CAPSULE | Freq: Every day | ORAL | Status: DC
  Administered 2014-09-06 – 2014-09-10 (×6): 25 mg via ORAL
  Filled 2014-09-06 (×7): qty 1

## 2014-09-06 NOTE — Progress Notes (Signed)
Patient: [name] / Admit Date: 09/05/2014 / Date of Encounter: 09/06/2014, 9:08 AM   Subjective: Still c/o lower abdominal pain and intermittent loose stools. No chest pain or dyspnea. She felt her heart go back into normal rhythm this AM.   Objective: Telemetry: converted to NSR overnight Physical Exam: Blood pressure 110/58, pulse 80, temperature 98.8 F (37.1 C), temperature source Oral, resp. rate 21, height  (1.676 m), weight 186 lb 9.6 oz (84.641 kg), SpO2 100 %. General: Well developed, well nourished AAF in no acute distress. Head: Normocephalic, atraumatic, sclera non-icteric, no xanthomas, nares are without discharge. Neck: Negative for carotid bruits. JVP not elevated. Lungs: Clear bilaterally to auscultation without wheezes, rales, or rhonchi. Breathing is unlabored. Heart: RRR S1 S2 without murmurs, rubs, or gallops.  Abdomen: Soft, non-distended with normoactive bowel sounds.  Extremities: No clubbing or cyanosis. No edema. Distal pedal pulses are 2+ and equal bilaterally. Neuro: Alert and oriented X 3. Moves all extremities spontaneously. Psych:  Responds to questions appropriately with a normal affect.   Intake/Output Summary (Last 24 hours) at 09/06/14 0908 Last data filed at 09/06/14 0825  Gross per 24 hour  Intake    480 ml  Output    425 ml  Net     55 ml    Inpatient Medications:  . aspirin EC  325 mg Oral Daily  . atorvastatin  10 mg Oral q1800  . carvedilol  12.5 mg Oral BID WC  . cholecalciferol  1,000 Units Oral BID  . diphenhydrAMINE  25 mg Oral QHS  . dofetilide  250 mcg Oral BID  . DULoxetine  60 mg Oral Daily  . exenatide  10 mcg Subcutaneous Daily  . ferrous sulfate  325 mg Oral Q breakfast  . insulin aspart  0-15 Units Subcutaneous TID WC  . insulin glargine  40 Units Subcutaneous QHS  . levETIRAcetam  2,000 mg Oral QHS  . mometasone-formoterol  2 puff Inhalation BID  . niacin  500 mg Oral QHS  . pantoprazole  80 mg Oral Q1200    . pregabalin  100 mg Oral TID  . rivaroxaban  15 mg Oral Q supper   Infusions:  . sodium chloride 50 mL/hr at 09/05/14 2059  . diltiazem (CARDIZEM) infusion Stopped (09/06/14 0440)    Labs:  Recent Labs  09/05/14 1636  NA 140  K 3.6  CL 102  CO2 20  GLUCOSE 157*  BUN 13  CREATININE 1.97*  CALCIUM 10.5  MG 2.0   No results for input(s): AST, ALT, ALKPHOS, BILITOT, PROT, ALBUMIN in the last 72 hours.  Recent Labs  09/05/14 1636  WBC 5.6  HGB 14.8  HCT 43.4  MCV 88.8  PLT 183    Recent Labs  09/05/14 2052 09/05/14 2225 09/06/14 0248  TROPONINI 0.03 0.03 0.04*   Invalid input(s): POCBNP No results for input(s): HGBA1C in the last 72 hours.   Radiology/Studies:  Dg Chest Portable 1 View  09/05/2014   CLINICAL DATA:  Chest pain. Shortness of breath. Nausea. Sarcoidosis.  EXAM: PORTABLE CHEST - 1 VIEW  COMPARISON:  08/02/2014  FINDINGS: Mild fullness of the left hilum. Otherwise the chest appears unremarkable. The lungs are clear.  IMPRESSION: 1. Mild fullness of the left hilum likely representing adenopathy in the setting of known sarcoidosis. Otherwise, no significant abnormalities are observed.   Electronically Signed   By: Herbie Baltimore M.D.   On: 09/05/2014 17:37     Assessment and Plan  1.  Recurrence of PAF with RVR (CHADSVASC 3) - spont converted to NSR overnight - frst known episode since 12/2013 thus continuation of [name] is appropriate - however, need to f/u BMET this AM to determine if she needs potassium repletion and also to reassess Cr since [name] dosing is renally adjusted - will resume KCl 10meq this AM per home med rec but may require additional supp to keep K>4 (Mg is fine) - sinus QTc is 486, down from 500 in 07/2014  2. AKI on CKD stage III  -  Suspect related to intravascular depletion in the setting of #1/5 - recheck BMET to determine if [name] needs adjusting  3.  Mildly elevated troponin (all negative then 0.04) - normal cors  2012, suspect demand related - EKG shows TWI II, III, avF, V3-V6 which is new from most recent tracings, but was seen in 01/2013 at time of ED eval for chest pain - will discuss further workup with MD   5. GI complaints of continued lower abdominal pain, nausea, emesis, diarrhea while on abx for UTI - workup per IM - whatever this process is may have been responsible for tipping off her AF  6. Essential HTN, controlled. 7. Known pulmonary sarcoidosis  Signed, Ronie Spiesayna Harvey Matlack PA-C

## 2014-09-06 NOTE — Progress Notes (Addendum)
[facility] PROGRESS NOTE  Toni RosenthalLinda F [name] AOZ:[id number]:[id number] DOB: [birth year] DOA: 09/05/2014 PCP: [name],VEITA J, MD  Assessment/Plan: 64 y/o female with PMH of HTN, HPL, PAF (xarelto), Sarcoidosis, DM, Epilepsy, Fibromyalgia, IBS, recent UTIs  is admitted with SOB, chest pains, A fib RVR   1. A fib RVR, off Cardizem drip; converted/currently in NSR; on tikosyn, BB, xarelto per cards;  2. Abdominal pains, chronic IBS; h/o IBS; exam unremarkable; colonoscopy (2013): diverticulosis  -pend CT abd, check c diff due to recent multiple atx exposure; check TTGA  3. DM, ha1c-7.5 (07/2014): cont insulin regimen; d/c metformin due to AKI/CKD; AG acidosis  4. AKI improved on IVF;' d/c metformin  5. Seizure disorder; stable; cont home antiepileptics   Code Status: full Family Communication: d/w patient (indicate person spoken with, relationship, and if by phone, the number) Disposition Plan: home 24-48 hrs    Consultants:  Cardiology   Procedures:  none  Antibiotics:  none (indicate start date, and stop date if known)  HPI/Subjective: alert  Objective: Filed Vitals:   09/06/14 0845  BP: 110/58  Pulse: 80  Temp:   Resp:     Intake/Output Summary (Last 24 hours) at 09/06/14 1152 Last data filed at 09/06/14 0825  Gross per 24 hour  Intake    480 ml  Output    425 ml  Net     55 ml   Filed Weights   09/05/14 2000  Weight: 84.641 kg (186 lb 9.6 oz)    Exam:   General:  alert  Cardiovascular: s1,s2 rrr  Respiratory: CTA BL  Abdomen: soft, NT; + BS, no rebound   Musculoskeletal: no leg edema   Data Reviewed: Basic Metabolic Panel:  Recent Labs Lab 09/05/14 1636 09/06/14 1026  NA 140 139  K 3.6 3.7  CL 102 106  CO2 20 22  GLUCOSE 157* 152*  BUN 13 11  CREATININE 1.97* 1.33*  CALCIUM 10.5 9.1  MG 2.0  --    Liver Function Tests: No results for input(s): AST, ALT, ALKPHOS, BILITOT, PROT, ALBUMIN in the last 168 hours. No results for input(s): LIPASE,  AMYLASE in the last 168 hours. No results for input(s): AMMONIA in the last 168 hours. CBC:  Recent Labs Lab 09/05/14 1636  WBC 5.6  HGB 14.8  HCT 43.4  MCV 88.8  PLT 183   Cardiac Enzymes:  Recent Labs Lab 09/05/14 2052 09/05/14 2225 09/06/14 0248  TROPONINI 0.03 0.03 0.04*   BNP (last 3 results)  Recent Labs  01/06/14 2020 08/02/14 0617  PROBNP 30.9 617.3*   CBG:  Recent Labs Lab 09/05/14 2046 09/06/14 0802  GLUCAP 150* 133*    Recent Results (from the past 240 hour(s))  MRSA PCR Screening     Status: None   Collection Time: 09/05/14  8:18 PM  Result Value Ref Range Status   MRSA by PCR NEGATIVE NEGATIVE Final    Comment:        The GeneXpert MRSA Assay (FDA approved for NASAL specimens only), is one component of a comprehensive MRSA colonization surveillance program. It is not intended to diagnose MRSA infection nor to guide or monitor treatment for MRSA infections.      Studies: Dg Chest Portable 1 View  09/05/2014   CLINICAL DATA:  Chest pain. Shortness of breath. Nausea. Sarcoidosis.  EXAM: PORTABLE CHEST - 1 VIEW  COMPARISON:  08/02/2014  FINDINGS: Mild fullness of the left hilum. Otherwise the chest appears unremarkable. The lungs are clear.  IMPRESSION: 1.  Mild fullness of the left hilum likely representing adenopathy in the setting of known sarcoidosis. Otherwise, no significant abnormalities are observed.   Electronically Signed   By: Herbie Baltimore M.D.   On: 09/05/2014 17:37    Scheduled Meds: . aspirin EC  325 mg Oral Daily  . atorvastatin  10 mg Oral q1800  . carvedilol  12.5 mg Oral BID WC  . cholecalciferol  1,000 Units Oral BID  . diphenhydrAMINE  25 mg Oral QHS  . dofetilide  250 mcg Oral BID  . DULoxetine  60 mg Oral Daily  . exenatide  10 mcg Subcutaneous Daily  . ferrous sulfate  325 mg Oral Q breakfast  . insulin aspart  0-15 Units Subcutaneous TID WC  . insulin glargine  40 Units Subcutaneous QHS  . iohexol  25 mL  Oral Q1 Hr x 2  . levETIRAcetam  2,000 mg Oral QHS  . mometasone-formoterol  2 puff Inhalation BID  . niacin  500 mg Oral QHS  . pantoprazole  80 mg Oral Q1200  . potassium chloride  10 mEq Oral Daily  . pregabalin  100 mg Oral TID  . rivaroxaban  15 mg Oral Q supper   Continuous Infusions: . sodium chloride 50 mL/hr at 09/05/14 2059  . diltiazem (CARDIZEM) infusion Stopped (09/06/14 0440)    Active Problems:   Diabetes mellitus   Essential hypertension   Sarcoid   Asthma   Atrial fibrillation with RVR   Chest pain   Abdominal pain   Acute renal failure syndrome   History of diverticulitis   Nausea vomiting and diarrhea    Time spent: >35 minutes     Esperanza Sheets  [facility] Pager [phone]. If 7PM-7AM, please contact night-coverage at [email or web address], password [facility] 09/06/2014, 11:52 AM  LOS: 1 day

## 2014-09-06 NOTE — Progress Notes (Signed)
Utilization review completed. Shevaun Lovan, RN, BSN. 

## 2014-09-06 NOTE — Progress Notes (Signed)
Repeat BMET shows Cr 1.33 with improved GFR. OK to continue current dose of [name]. K+ needs to be >/=4.0 thus will increase her potassium supplementation to daily.  F/u BMET in AM (will also need to be monitored as outpatient given possible transient contribution of GI losses). Dayna Dunn PA-C

## 2014-09-06 NOTE — Progress Notes (Signed)
At approximately 0015, pt was noted to be in NSR with heart rate 60s-70s. A 12 lead EKG was obtained and MD was notified. Will continue to monitor.

## 2014-09-07 ENCOUNTER — Inpatient Hospital Stay (HOSPITAL_COMMUNITY): Payer: Medicaid Other

## 2014-09-07 LAB — LIPASE, BLOOD: LIPASE: 61 U/L — AB (ref 11–59)

## 2014-09-07 LAB — COMPREHENSIVE METABOLIC PANEL
ALBUMIN: 3.7 g/dL (ref 3.5–5.2)
ALT: 15 U/L (ref 0–35)
AST: 26 U/L (ref 0–37)
Alkaline Phosphatase: 85 U/L (ref 39–117)
Anion gap: 8 (ref 5–15)
BUN: 13 mg/dL (ref 6–23)
CO2: 22 mmol/L (ref 19–32)
Calcium: 9.1 mg/dL (ref 8.4–10.5)
Chloride: 109 mmol/L (ref 96–112)
Creatinine, Ser: 1.27 mg/dL — ABNORMAL HIGH (ref 0.50–1.10)
GFR calc Af Amer: 51 mL/min — ABNORMAL LOW (ref >90)
GFR, EST NON AFRICAN AMERICAN: 44 mL/min — AB (ref >90)
GLUCOSE: 164 mg/dL — AB (ref 70–99)
POTASSIUM: 4.7 mmol/L (ref 3.5–5.1)
Sodium: 139 mmol/L (ref 135–145)
Total Bilirubin: 0.1 mg/dL — ABNORMAL LOW (ref 0.3–1.2)
Total Protein: 6.5 g/dL (ref 6.0–8.3)

## 2014-09-07 LAB — GLUCOSE, CAPILLARY
GLUCOSE-CAPILLARY: 154 mg/dL — AB (ref 70–99)
Glucose-Capillary: 141 mg/dL — ABNORMAL HIGH (ref 70–99)
Glucose-Capillary: 85 mg/dL (ref 70–99)
Glucose-Capillary: 89 mg/dL (ref 70–99)

## 2014-09-07 LAB — BASIC METABOLIC PANEL
ANION GAP: 6 (ref 5–15)
BUN: 13 mg/dL (ref 6–23)
CO2: 23 mmol/L (ref 19–32)
CREATININE: 1.2 mg/dL — AB (ref 0.50–1.10)
Calcium: 8.9 mg/dL (ref 8.4–10.5)
Chloride: 112 mEq/L (ref 96–112)
GFR calc non Af Amer: 47 mL/min — ABNORMAL LOW (ref >90)
GFR, EST AFRICAN AMERICAN: 55 mL/min — AB (ref >90)
GLUCOSE: 126 mg/dL — AB (ref 70–99)
Potassium: 4.4 mmol/L (ref 3.5–5.1)
Sodium: 141 mmol/L (ref 135–145)

## 2014-09-07 LAB — TRIGLYCERIDES: TRIGLYCERIDES: 465 mg/dL — AB (ref <150)

## 2014-09-07 LAB — TISSUE TRANSGLUTAMINASE, IGA: Tissue Transglutaminase Ab, IgA: 1 U/mL (ref <4)

## 2014-09-07 MED ORDER — HYDROMORPHONE HCL 1 MG/ML IJ SOLN: 1.0000 mg | INTRAMUSCULAR | Status: DC | PRN

## 2014-09-07 MED ORDER — GEMFIBROZIL 600 MG PO TABS
600.0000 mg | ORAL_TABLET | Freq: Two times a day (BID) | ORAL | Status: DC
  Administered 2014-09-07 – 2014-09-11 (×8): 600 mg via ORAL
  Filled 2014-09-07 (×12): qty 1

## 2014-09-07 NOTE — Progress Notes (Signed)
[facility] PROGRESS NOTE  [name] FAO:[id number] DOB: [birth year] DOA: 09/05/2014 PCP: [name],VEITA J, MD  Assessment/Plan: 64 y/o female with PMH of HTN, HPL, PAF (xarelto), Sarcoidosis, DM, Epilepsy, Fibromyalgia, IBS, recent UTIs  is admitted with SOB, chest pains, A fib RVR   1. A fib RVR, off Cardizem drip; converted/currently in NSR; on tikosyn, BB, xarelto per cards;  2. Abdominal pains, chronic IBS; h/o IBS; exam unremarkable; colonoscopy (2013): diverticulosis  -CT abd: faint haziness of pancreatic fat ? Mild pancreatitis;  -no vomiting, no diarrhea; lipase 61; neg: c diff; TTGA -pend  -check LFTs, repeat lipase, US liver/galbladder; check triglycerides; ? byetta side effect(hols byetta); patient is still reports mild abd pain; cont diet as tolerated; needs outpatient GI follow up  3. DM, ha1c-7.5 (07/2014): cont insulin regimen; hold byetta due to mild pancreatitis; d/c metformin due to AKI/CKD; AG acidosis  4. AKI improved on IVF;' d/c metformin  5. Seizure disorder; stable; cont home antiepileptics   Code Status: full Family Communication: d/w patient (indicate person spoken with, relationship, and if by phone, the number) Disposition Plan: home 24-48 hrs    Consultants:  Cardiology   Procedures:  none  Antibiotics:  none (indicate start date, and stop date if known)  HPI/Subjective: alert  Objective: Filed Vitals:   09/07/14 1208  BP: 122/63  Pulse: 56  Temp: 97.8 F (36.6 C)  Resp: 18    Intake/Output Summary (Last 24 hours) at 09/07/14 1211 Last data filed at 09/07/14 1040  Gross per 24 hour  Intake 1560.83 ml  Output   1275 ml  Net 285.83 ml   Filed Weights   09/05/14 2000 09/07/14 0400  Weight: 84.641 kg (186 lb 9.6 oz) 84.823 kg (187 lb)    Exam:   General:  alert  Cardiovascular: s1,s2 rrr  Respiratory: CTA BL  Abdomen: soft, NT; + BS, no rebound   Musculoskeletal: no leg edema   Data Reviewed: Basic Metabolic  Panel:  Recent Labs Lab 09/05/14 1636 09/06/14 1026 09/07/14 0330  NA 140 139 141  K 3.6 3.7 4.4  CL 102 106 112  CO2 20 22 23   GLUCOSE 157* 152* 126*  BUN 13 11 13   CREATININE 1.97* 1.33* 1.20*  CALCIUM 10.5 9.1 8.9  MG 2.0  --   --    Liver Function Tests: No results for input(s): AST, ALT, ALKPHOS, BILITOT, PROT, ALBUMIN in the last 168 hours.  Recent Labs Lab 09/07/14 0341  LIPASE 61*   No results for input(s): AMMONIA in the last 168 hours. CBC:  Recent Labs Lab 09/05/14 1636  WBC 5.6  HGB 14.8  HCT 43.4  MCV 88.8  PLT 183   Cardiac Enzymes:  Recent Labs Lab 09/05/14 2052 09/05/14 2225 09/06/14 0248  TROPONINI 0.03 0.03 0.04*   BNP (last 3 results)  Recent Labs  01/06/14 2020 08/02/14 0617  PROBNP 30.9 617.3*   CBG:  Recent Labs Lab 09/06/14 0802 09/06/14 1157 09/06/14 1615 09/06/14 2118 09/07/14 0739  GLUCAP 133* 115* 103* 139* 141*    Recent Results (from the past 240 hour(s))  MRSA PCR Screening     Status: None   Collection Time: 09/05/14  8:18 PM  Result Value Ref Range Status   MRSA by PCR NEGATIVE NEGATIVE Final    Comment:        The GeneXpert MRSA Assay (FDA approved for NASAL specimens only), is one component of a comprehensive MRSA colonization surveillance program. It is not intended to diagnose  MRSA infection nor to guide or monitor treatment for MRSA infections.   Clostridium Difficile by PCR     Status: None   Collection Time: 09/06/14  2:25 PM  Result Value Ref Range Status   C difficile by pcr NEGATIVE NEGATIVE Final     Studies: Ct Abdomen Pelvis Wo Contrast  09/06/2014   CLINICAL DATA:  Upper abdominal pain radiating to back with nausea and vomiting 2 days.  EXAM: CT ABDOMEN AND PELVIS WITHOUT CONTRAST  TECHNIQUE: Multidetector CT imaging of the abdomen and pelvis was performed following the standard protocol without IV contrast.  COMPARISON:  CT 08/02/2014  FINDINGS: Lower chest:  Lung bases are clear.   Hepatobiliary: Non IV contrast images demonstrate no focal hepatic lesion. Gallbladder.  Pancreas: No clear pancreatic inflammation. There is faint haziness in the fat around the pancreas and adjacent to the celiac trunk.  Spleen: Normal spleen.  Adrenals/urinary tract: Adrenal glands normal. No nephrolithiasis or ureterolithiasis. Bladder normal.  Stomach/Bowel: Stomach, small bowel, appendix, cecum normal. Colon and right no sigmoid colon are normal.  Vascular/Lymphatic: Aorta is normal caliber. No retroperitoneal periportal lymphadenopathy.  Reproductive: Post hysterectomy anatomy.  Adnexal abnormality.  Musculoskeletal: Aggressive osseous lesion  IMPRESSION: 1. Faint haziness of peripancreatic fat. Recommend serum biochemical correlation for mild pancreatitis. 2.  No nephrolithiasis or ureterolithiasis. 3. Normal appendix.   Electronically Signed   By: Genevive Bi M.D.   On: 09/06/2014 16:06   Dg Chest Portable 1 View  09/05/2014   CLINICAL DATA:  Chest pain. Shortness of breath. Nausea. Sarcoidosis.  EXAM: PORTABLE CHEST - 1 VIEW  COMPARISON:  08/02/2014  FINDINGS: Mild fullness of the left hilum. Otherwise the chest appears unremarkable. The lungs are clear.  IMPRESSION: 1. Mild fullness of the left hilum likely representing adenopathy in the setting of known sarcoidosis. Otherwise, no significant abnormalities are observed.   Electronically Signed   By: Herbie Baltimore M.D.   On: 09/05/2014 17:37    Scheduled Meds: . aspirin EC  325 mg Oral Daily  . atorvastatin  10 mg Oral q1800  . carvedilol  12.5 mg Oral BID WC  . cholecalciferol  1,000 Units Oral BID  . diphenhydrAMINE  25 mg Oral QHS  . dofetilide  250 mcg Oral BID  . DULoxetine  60 mg Oral Daily  . exenatide  10 mcg Subcutaneous Daily  . ferrous sulfate  325 mg Oral Q breakfast  . insulin aspart  0-15 Units Subcutaneous TID WC  . insulin glargine  40 Units Subcutaneous QHS  . levETIRAcetam  2,000 mg Oral QHS  .  mometasone-formoterol  2 puff Inhalation BID  . niacin  500 mg Oral QHS  . pantoprazole  80 mg Oral Q1200  . potassium chloride  40 mEq Oral Daily  . pregabalin  100 mg Oral TID  . rivaroxaban  20 mg Oral Q supper   Continuous Infusions: . sodium chloride 50 mL/hr at 09/06/14 1729  . diltiazem (CARDIZEM) infusion Stopped (09/06/14 0440)    Active Problems:   Diabetes mellitus   Essential hypertension   Sarcoid   Asthma   Atrial fibrillation with RVR   Chest pain   Abdominal pain   Acute renal failure syndrome   History of diverticulitis   Nausea vomiting and diarrhea    Time spent: >35 minutes     Esperanza Sheets  [facility] Pager [phone]. If 7PM-7AM, please contact night-coverage at [email or web address], password [facility] 09/07/2014, 12:11 PM  LOS: 2 days

## 2014-09-07 NOTE — Progress Notes (Signed)
Subjective: Chest pressure more down the left side.  Diffuse Abd pain, SOB  Objective: Vital signs in last 24 hours: Temp:  [97.4 F (36.3 C)-98.2 F (36.8 C)] 98.1 F (36.7 C) (01/22 0800) Pulse Rate:  [53-80] 60 (01/22 0800) Resp:  [10-16] 16 (01/22 0800) BP: (109-118)/(51-69) 111/51 mmHg (01/22 0800) SpO2:  [97 %-99 %] 99 % (01/22 0800) Weight:  [187 lb (84.823 kg)] 187 lb (84.823 kg) (01/22 0400) Last BM Date: 09/06/14  Intake/Output from previous day: 01/21 0701 - 01/22 0700 In: 1720.8 [P.O.:720; I.V.:1000.8] Out: 425 [Urine:425] Intake/Output this shift: Total I/O In: -  Out: 525 [Urine:525]  Medications Current Facility-Administered Medications  Medication Dose Route Frequency Provider Last Rate Last Dose  . 0.9 %  sodium chloride infusion   Intravenous Continuous Yevonne Pax, MD 50 mL/hr at 09/06/14 1729    . acetaminophen (TYLENOL) tablet 650 mg  650 mg Oral Q4H PRN Yevonne Pax, MD      . albuterol (PROVENTIL) (2.5 MG/3ML) 0.083% nebulizer solution 2.5 mg  2.5 mg Nebulization Q4H PRN Yevonne Pax, MD      . aspirin EC tablet 325 mg  325 mg Oral Daily Yevonne Pax, MD   325 mg at 09/06/14 0946  . atorvastatin (LIPITOR) tablet 10 mg  10 mg Oral q1800 Yevonne Pax, MD   10 mg at 09/06/14 1619  . carvedilol (COREG) tablet 12.5 mg  12.5 mg Oral BID WC Yevonne Pax, MD   12.5 mg at 09/06/14 1626  . cholecalciferol (VITAMIN D) tablet 1,000 Units  1,000 Units Oral BID Yevonne Pax, MD   1,000 Units at 09/06/14 2234  . diclofenac sodium (VOLTAREN) 1 % transdermal gel 2 g  2 g Topical QID PRN Yevonne Pax, MD   2 g at 09/06/14 1256  . diltiazem (CARDIZEM) 100 mg in dextrose 5 % 100 mL (1 mg/mL) infusion  5-15 mg/hr Intravenous Titrated Yevonne Pax, MD   Stopped at 09/06/14 0440  . diphenhydrAMINE (BENADRYL) capsule 25 mg  25 mg Oral QHS Leda Gauze, NP   25 mg at 09/06/14 2234  . dofetilide ([name]) capsule 250 mcg  250 mcg Oral BID Yevonne Pax, MD    250 mcg at 09/06/14 2234  . DULoxetine (CYMBALTA) DR capsule 60 mg  60 mg Oral Daily Yevonne Pax, MD   60 mg at 09/06/14 0946  . exenatide (BYETTA) injection SOPN 10 mcg  10 mcg Subcutaneous Daily Yevonne Pax, MD   10 mcg at 09/05/14 2015  . ferrous sulfate tablet 325 mg  325 mg Oral Q breakfast Yevonne Pax, MD   325 mg at 09/06/14 0845  . insulin aspart (novoLOG) injection 0-15 Units  0-15 Units Subcutaneous TID WC Yevonne Pax, MD   2 Units at 09/06/14 0845  . insulin glargine (LANTUS) injection 40 Units  40 Units Subcutaneous QHS Leda Gauze, NP   40 Units at 09/06/14 2234  . levETIRAcetam (KEPPRA XR) 24 hr tablet 2,000 mg  2,000 mg Oral QHS Yevonne Pax, MD   2,000 mg at 09/06/14 2234  . mometasone-formoterol (DULERA) 200-5 MCG/ACT inhaler 2 puff  2 puff Inhalation BID Yevonne Pax, MD   2 puff at 09/06/14 2003  . morphine 2 MG/ML injection 2 mg  2 mg Intravenous Q2H PRN Yevonne Pax, MD      . niacin (NIASPAN) CR tablet 500 mg  500 mg Oral QHS  Yevonne PaxSaadat A Khan, MD   500 mg at 09/06/14 2234  . nitroGLYCERIN (NITROSTAT) SL tablet 0.4 mg  0.4 mg Sublingual Q5 min PRN Yevonne PaxSaadat A Khan, MD      . ondansetron [facility](ZOFRAN) injection 4 mg  4 mg Intravenous Q6H PRN Yevonne PaxSaadat A Khan, MD      . oxyCODONE-acetaminophen (PERCOCET/ROXICET) 5-325 MG per tablet 1 tablet  1 tablet Oral Q8H PRN Leda GauzeKaren J Kirby-Graham, NP   1 tablet at 09/06/14 1619  . pantoprazole (PROTONIX) EC tablet 80 mg  80 mg Oral Q1200 Yevonne PaxSaadat A Khan, MD   80 mg at 09/06/14 1207  . potassium chloride SA (K-DUR,KLOR-CON) CR tablet 40 mEq  40 mEq Oral Daily Dayna N Dunn, PA-C      . pregabalin (LYRICA) capsule 100 mg  100 mg Oral TID Yevonne PaxSaadat A Khan, MD   100 mg at 09/06/14 2234  . rivaroxaban (XARELTO) tablet 20 mg  20 mg Oral Q supper [facility] HospitalJennifer Danielle Artesia, RPH   20 mg at 09/06/14 1626  . zolpidem (AMBIEN) tablet 5 mg  5 mg Oral QHS PRN Yevonne PaxSaadat A Khan, MD        PE: General appearance: alert, cooperative and no distress Lungs:  clear to auscultation bilaterally Heart: regular rate and rhythm, S1, S2 normal, no murmur, click, rub or gallop Abdomen: +BS.  Diffusely tender L/RLQ and LUQ Extremities: No LEE Pulses: 2+ and symmetric Skin: Warm and dry Neurologic: Grossly normal  Lab Results:   Recent Labs  09/05/14 1636  WBC 5.6  HGB 14.8  HCT 43.4  PLT 183   BMET  Recent Labs  09/05/14 1636 09/06/14 1026 09/07/14 0330  NA 140 139 141  K 3.6 3.7 4.4  CL 102 106 112  CO2 20 22 23   GLUCOSE 157* 152* 126*  BUN 13 11 13   CREATININE 1.97* 1.33* 1.20*  CALCIUM 10.5 9.1 8.9   PT/INR  Recent Labs  09/05/14 1636  LABPROT 12.3  INR 0.91      Assessment/Plan   Active Problems:   Atrial fibrillation with RVR Maintaining NSR on tikosyn, BB, Xarelto.  Creatine Clearance 65.2.  Renal dosing 250mcg which she is on.  Last QTc 486ms.  500ms in December.   Hypokalemia  Resolved.    Diabetes mellitus  Metformin was stopped.  Per IM   Essential hypertension  Controlled.   Sarcoid   Asthma  No wheezing on exam.  She reports being SOB   Chest pain Continues to have chest pressure which appears more down the left side and LUQ. Normal coronaries in 2012.  Troponin 0.04.  OP nuc was recommended.     Abdominal pain CT abd/pelvis: 'Faint haziness of peripancreatic fat. Recommend serum biochemical correlation for mild pancreatitis.'  She is tender in that are and also the lower adb.  Ordered lipase.    Acute renal failure syndrome  Mild improvement in SCr.    History of diverticulitis   Nausea vomiting and diarrhea    LOS: 2 days    Blakely Maranan PA-C 09/07/2014 8:29 AM

## 2014-09-08 ENCOUNTER — Inpatient Hospital Stay (HOSPITAL_COMMUNITY): Payer: Medicaid Other

## 2014-09-08 DIAGNOSIS — IMO0001 Reserved for inherently not codable concepts without codable children: Secondary | ICD-10-CM | POA: Diagnosis present

## 2014-09-08 DIAGNOSIS — R1013 Epigastric pain: Secondary | ICD-10-CM

## 2014-09-08 DIAGNOSIS — E1165 Type 2 diabetes mellitus with hyperglycemia: Secondary | ICD-10-CM

## 2014-09-08 DIAGNOSIS — K589 Irritable bowel syndrome without diarrhea: Secondary | ICD-10-CM | POA: Diagnosis present

## 2014-09-08 DIAGNOSIS — G8929 Other chronic pain: Secondary | ICD-10-CM

## 2014-09-08 DIAGNOSIS — G40909 Epilepsy, unspecified, not intractable, without status epilepticus: Secondary | ICD-10-CM

## 2014-09-08 DIAGNOSIS — E119 Type 2 diabetes mellitus without complications: Secondary | ICD-10-CM

## 2014-09-08 DIAGNOSIS — R55 Syncope and collapse: Secondary | ICD-10-CM | POA: Diagnosis present

## 2014-09-08 DIAGNOSIS — Z794 Long term (current) use of insulin: Secondary | ICD-10-CM

## 2014-09-08 LAB — COMPREHENSIVE METABOLIC PANEL
ALK PHOS: 70 U/L (ref 39–117)
ALT: 16 U/L (ref 0–35)
AST: 19 U/L (ref 0–37)
Albumin: 3.5 g/dL (ref 3.5–5.2)
Anion gap: 9 (ref 5–15)
BILIRUBIN TOTAL: 0.6 mg/dL (ref 0.3–1.2)
BUN: 11 mg/dL (ref 6–23)
CHLORIDE: 109 mmol/L (ref 96–112)
CO2: 22 mmol/L (ref 19–32)
Calcium: 8.9 mg/dL (ref 8.4–10.5)
Creatinine, Ser: 1.22 mg/dL — ABNORMAL HIGH (ref 0.50–1.10)
GFR calc non Af Amer: 46 mL/min — ABNORMAL LOW (ref >90)
GFR, EST AFRICAN AMERICAN: 54 mL/min — AB (ref >90)
Glucose, Bld: 85 mg/dL (ref 70–99)
Potassium: 4.2 mmol/L (ref 3.5–5.1)
Sodium: 140 mmol/L (ref 135–145)
Total Protein: 6.4 g/dL (ref 6.0–8.3)

## 2014-09-08 LAB — TROPONIN I: Troponin I: <0.03 ng/mL (ref <0.031)

## 2014-09-08 LAB — GLUCOSE, CAPILLARY
GLUCOSE-CAPILLARY: 79 mg/dL (ref 70–99)
GLUCOSE-CAPILLARY: 137 mg/dL — AB (ref 70–99)
Glucose-Capillary: 97 mg/dL (ref 70–99)
Glucose-Capillary: 133 mg/dL — ABNORMAL HIGH (ref 70–99)

## 2014-09-08 LAB — TSH: TSH: 0.374 u[IU]/mL (ref 0.350–4.500)

## 2014-09-08 LAB — LIPASE, BLOOD: LIPASE: 48 U/L (ref 11–59)

## 2014-09-08 MED ORDER — CARVEDILOL 6.25 MG PO TABS
6.2500 mg | ORAL_TABLET | Freq: Two times a day (BID) | ORAL | Status: DC
  Administered 2014-09-08 – 2014-09-09 (×2): 6.25 mg via ORAL
  Filled 2014-09-08 (×2): qty 1

## 2014-09-08 MED ORDER — ONDANSETRON HCL 4 MG PO TABS: 4.0000 mg | ORAL_TABLET | Freq: Four times a day (QID) | ORAL | Status: DC | PRN

## 2014-09-08 MED ORDER — INSULIN GLARGINE 100 UNIT/ML ~~LOC~~ SOLN
30.0000 [IU] | Freq: Every day | SUBCUTANEOUS | Status: DC
Start: 2014-09-08 — End: 2014-09-09
  Administered 2014-09-08: 30 [IU] via SUBCUTANEOUS
  Filled 2014-09-08 (×2): qty 0.3

## 2014-09-08 NOTE — Discharge Instructions (Addendum)
Information on my medicine - XARELTO (Rivaroxaban)  This medication education was reviewed with me or my healthcare representative as part of my discharge preparation.  The pharmacist that spoke with me during my hospital stay was:  Elvin Soicolsen, Erika K, [facility]  Why was Xarelto prescribed for you? Xarelto was prescribed for you to reduce the risk of a blood clot forming that can cause a stroke if you have a medical condition called atrial fibrillation (a type of irregular heartbeat).  What do you need to know about xarelto ? Take your Xarelto ONCE DAILY at the same time every day with your evening meal. If you have difficulty swallowing the tablet whole, you may crush it and mix in applesauce just prior to taking your dose.  Take Xarelto exactly as prescribed by your doctor and DO NOT stop taking Xarelto without talking to the doctor who prescribed the medication.  Stopping without other stroke prevention medication to take the place of Xarelto may increase your risk of developing a clot that causes a stroke.  Refill your prescription before you run out.  After discharge, you should have regular check-up appointments with your healthcare provider that is prescribing your Xarelto.  In the future your dose may need to be changed if your kidney function or weight changes by a significant amount.  What do you do if you miss a dose? If you are taking Xarelto ONCE DAILY and you miss a dose, take it as soon as you remember on the same day then continue your regularly scheduled once daily regimen the next day. Do not take two doses of Xarelto at the same time or on the same day.   Important Safety Information A possible side effect of Xarelto is bleeding. You should call your healthcare provider right away if you experience any of the following: ? Bleeding from an injury or your nose that does not stop. ? Unusual colored urine (red or dark brown) or unusual colored stools (red or  black). ? Unusual bruising for unknown reasons. ? A serious fall or if you hit your head (even if there is no bleeding).  Some medicines may interact with Xarelto and might increase your risk of bleeding while on Xarelto. To help avoid this, consult your healthcare provider or pharmacist prior to using any new prescription or non-prescription medications, including herbals, vitamins, non-steroidal anti-inflammatory drugs (NSAIDs) and supplements.  This website has more information on Xarelto: [email or web address].  Food Choices for Gastroesophageal Reflux Disease When you have gastroesophageal reflux disease (GERD), the foods you eat and your eating habits are very important. Choosing the right foods can help ease the discomfort of GERD. WHAT GENERAL GUIDELINES DO I NEED TO FOLLOW?  Choose fruits, vegetables, whole grains, low-fat dairy products, and low-fat meat, fish, and poultry.  Limit fats such as oils, salad dressings, butter, nuts, and avocado.  Keep a food diary to identify foods that cause symptoms.  Avoid foods that cause reflux. These may be different for different people.  Eat frequent small meals instead of three large meals each day.  Eat your meals slowly, in a relaxed setting.  Limit fried foods.  Cook foods using methods other than frying.  Avoid drinking alcohol.  Avoid drinking large amounts of liquids with your meals.  Avoid bending over or lying down until 2-3 hours after eating. WHAT FOODS ARE NOT RECOMMENDED? The following are some foods and drinks that may worsen your symptoms: Vegetables Tomatoes. Tomato juice. Tomato and spaghetti sauce. Chili peppers. Onion  and garlic. Horseradish. Fruits Oranges, grapefruit, and lemon (fruit and juice). Meats High-fat meats, fish, and poultry. This includes hot dogs, ribs, ham, sausage, salami, and bacon. Dairy Whole milk and chocolate milk. Sour cream. Cream. Butter. Ice cream. Cream cheese.  Beverages Coffee and  tea, with or without caffeine. Carbonated beverages or energy drinks. Condiments Hot sauce. Barbecue sauce.  Sweets/Desserts Chocolate and cocoa. Donuts. Peppermint and spearmint. Fats and Oils High-fat foods, including [place] fries and potato chips. Other Vinegar. Strong spices, such as black pepper, white pepper, red pepper, cayenne, curry powder, cloves, ginger, and chili powder. The items listed above may not be a complete list of foods and beverages to avoid. Contact your dietitian for more information. Document Released: 08/03/2005 Document Revised: 08/08/2013 Document Reviewed: 06/07/2013 [facility] Patient Information 2015 [place], [place]. This information is not intended to replace advice given to you by your health care provider. Make sure you discuss any questions you have with your health care provider. Insulin Glargine injection What is this medicine? INSULIN GLARGINE (IN su lin GLAR geen) is a human-made form of insulin. This drug lowers the amount of sugar in your blood. It is a long-acting insulin that is usually given once a day. This medicine may be used for other purposes; ask your health care provider or pharmacist if you have questions. COMMON BRAND NAME(S): Lantus, Lantus SoloStar, Toujeo SoloStar What should I tell my health care provider before I take this medicine? They need to know if you have any of these conditions: -episodes of hypoglycemia -kidney disease -liver disease -an unusual or allergic reaction to insulin, metacresol, other medicines, foods, dyes, or preservatives -pregnant or trying to get pregnant -breast-feeding How should I use this medicine? This medicine is for injection under the skin. Use this medicine at the same time each day. Use exactly as directed. This insulin should never be mixed in the same syringe with other insulins before injection. Do not vigorously shake before use. You will be taught how to use this medicine and how to adjust doses  for activities and illness. Do not use more insulin than prescribed. Always check the appearance of your insulin before using it. This medicine should be clear and colorless like water. Do not use it if it is cloudy, thickened, colored, or has solid particles in it. It is important that you put your used needles and syringes in a special sharps container. Do not put them in a trash can. If you do not have a sharps container, call your pharmacist or healthcare provider to get one. Talk to your pediatrician regarding the use of this medicine in children. Special care may be needed. Overdosage: If you think you have taken too much of this medicine contact a poison control center or emergency room at once. NOTE: This medicine is only for you. Do not share this medicine with others. What if I miss a dose? It is important not to miss a dose. Your health care professional or doctor should discuss a plan for missed doses with you. If you do miss a dose, follow their plan. Do not take double doses. What may interact with this medicine? -other medicines for diabetes Many medications may cause an increase or decrease in blood sugar, these include: -alcohol containing beverages -aspirin and aspirin-like drugs -chloramphenicol -chromium -diuretics -female hormones, like estrogens or progestins and birth control pills -heart medicines -isoniazid -female hormones or anabolic steroids -medicines for weight loss -medicines for allergies, asthma, cold, or cough -medicines for mental problems -  medicines called MAO Inhibitors like Nardil, Parnate, Marplan, Eldepryl -niacin -NSAIDs, medicines for pain and inflammation, like ibuprofen or naproxen -pentamidine -phenytoin -probenecid -quinolone antibiotics like ciprofloxacin, levofloxacin, ofloxacin -some herbal dietary supplements -steroid medicines like prednisone or cortisone -thyroid medicine Some medications can hide the warning symptoms of low blood  sugar. You may need to monitor your blood sugar more closely if you are taking one of these medications. These include: -beta-blockers such as atenolol, metoprolol, propranolol -clonidine -guanethidine -reserpine This list may not describe all possible interactions. Give your health care provider a list of all the medicines, herbs, non-prescription drugs, or dietary supplements you use. Also tell them if you smoke, drink alcohol, or use illegal drugs. Some items may interact with your medicine. What should I watch for while using this medicine? Visit your health care professional or doctor for regular checks on your progress. A test called the HbA1C (A1C) will be monitored. This is a simple blood test. It measures your blood sugar control over the last 2 to 3 months. You will receive this test every 3 to 6 months. Learn how to check your blood sugar. Learn the symptoms of low and high blood sugar and how to manage them. Always carry a quick-source of sugar with you in case you have symptoms of low blood sugar. Examples include hard sugar candy or glucose tablets. Make sure others know that you can choke if you eat or drink when you develop serious symptoms of low blood sugar, such as seizures or unconsciousness. They must get medical help at once. Tell your doctor or health care professional if you have high blood sugar. You might need to change the dose of your medicine. If you are sick or exercising more than usual, you might need to change the dose of your medicine. Do not skip meals. Ask your doctor or health care professional if you should avoid alcohol. Many nonprescription cough and cold products contain sugar or alcohol. These can affect blood sugar. Make sure that you have the right kind of syringe for the type of insulin you use. Try not to change the brand and type of insulin or syringe unless your health care professional or doctor tells you to. Switching insulin brand or type can cause  dangerously high or low blood sugar. Always keep an extra supply of insulin, syringes, and needles on hand. Use a syringe one time only. Throw away syringe and needle in a closed container to prevent accidental needle sticks. Insulin pens and cartridges should never be shared. Even if the needle is changed, sharing may result in passing of viruses like hepatitis or HIV. Wear a medical ID bracelet or chain, and carry a card that describes your disease and details of your medicine and dosage times. What side effects may I notice from receiving this medicine? Side effects that you should report to your health care professional or doctor as soon as possible: -allergic reactions like skin rash, itching or hives, swelling of the face, lips, or tongue -breathing problems -signs and symptoms of high blood sugar such as dizziness, dry mouth, dry skin, fruity breath, nausea, stomach pain, increased hunger or thirst, increased urination -signs and symptoms of low blood sugar such as feeling anxious, confusion, dizziness, increased hunger, unusually weak or tired, sweating, shakiness, cold, irritable, headache, blurred vision, fast heartbeat, loss of consciousness Side effects that usually do not require medical attention (report to your health care professional or doctor if they continue or are bothersome): -increase or decrease  in fatty tissue under the skin due to overuse of a particular injection site -itching, burning, swelling, or rash at site where injected This list may not describe all possible side effects. Call your doctor for medical advice about side effects. You may report side effects to FDA at 1-800-FDA-1088. Where should I keep my medicine? Keep out of the reach of children. Store unopened vials in a refrigerator between 2 and 8 degrees C (36 and 46 degrees F). Do not freeze or use if the insulin has been frozen. Opened vials (vials currently in use) may be stored in the refrigerator or at room  temperature, at approximately 25 degrees C (77 degrees F) or cooler. Keeping your insulin at room temperature decreases the amount of pain during injection. Once opened, your insulin can be used for 28 days. After 28 days, the vial should be thrown away. Store unopened pen-injector cartridges in a refrigerator between 2 and 8 degrees C (36 and 46 degrees F.) Do not freeze or use if the insulin has been frozen. Insulin cartridges inserted into the [employer] system should be kept at room temperature, approximately 25 degrees C (77 degrees F) or cooler. Do not store in the refrigerator. Once inserted into the [employer] system, the insulin can be used for 28 days. After 28 days, the cartridge should be thrown away. Protect from light and excessive heat. Throw away any unused medicine after the expiration date or after the specified time for room temperature storage has passed. NOTE: This sheet is a summary. It may not cover all possible information. If you have questions about this medicine, talk to your doctor, pharmacist, or health care provider.  2015, [employer]. (2013-10-12 10:13:56)

## 2014-09-08 NOTE — Progress Notes (Signed)
[facility] PROGRESS NOTE  [name] EAV:[id number] DOB: Mar 05, 1951 DOA: 09/05/2014 PCP: Geraldo Pitter, MD  Assessment/Plan: Syncope and collapse -Patient passed out this a.m. while walking from bed to sink to brush her teeth. Struck her knees elbow and shoulder, x-rays all negative for acute fracture. -Reviewed MAR  -Patient bradycardic on current medication, will decrease Coreg to 6.25 mg BID -Obtain orthostatic vitals every shift -PT/OT consult;Patient with A. fib RVR, and syncopal event just prior to discharge evaluate for home health vs SNF -Obtain troponin 3 -TSH -Patient's CBGs have also been on the low side decrease Lantus to 30 units  QHS  -Continue moderate SSI -Discontinue morphine when necessary  A fib RVR,  -Continue Coreg at reduced dose 6.25 mg BID -Continue [name] 250 mg BID -Continue Xarelto 20 mg daily however may need to rethink this medication if patient continues to have falls.  Chronic Abdominal pains -PRN morphine discontinued -Continue patient's home medication of Percocet 5-3 25 TID PRN  -Change Zofran PO 4 mg,PRN nausea   chronic IBS; h/o IBS;  -exam unremarkable; colonoscopy (2013): diverticulosis  -CT abd: faint haziness of pancreatic fat ? Mild pancreatitis;  -no vomiting, no diarrhea; lipase 61; neg: c diff; TTGA -pend  -check LFTs, repeat lipase, US liver/galbladder; check triglycerides; ? byetta side effect(hols byetta); patient is still reports mild abd pain; cont diet as tolerated; needs outpatient GI follow up   DM, type 2 uncontrolled -07/2014 hemoglobin A1c =7.5  -Continue Lantus 30 units daily -DC Byetta as it can cause/exacerbate pancreatitis;   Acute kidney failure  -Baseline creatinine~1.1 to 1.2 -d/c metformin due to AKI/CKD; AG acidosis    Seizure disorder; - stable -Keppra 2000 mg QHS      Code Status: Full Family Communication: None (indicate person spoken with, relationship, and if by phone, the  number) Disposition Plan: Next 24-48 hours   Consultants:   Procedures: 08/03/14 echocardiogram :Left ventricle: mild LVH. -LVEF= 60% to 65%. - Right atrium: The atrium was mildly dilated.  Cultures NA  Antibiotics: NA  DVT prophylaxis Xarelto    HPI/Subjective: [name] is a 64 y.o. BF PMHx depression, fibromyalgia, anxiety, epilepsy, peripheral neuropathy, HTN, paroxysmal atrial fibrillation, MI, diabetes type 2, HLD, LVH, sarcoidosis of lung, irritable bowel syndrome, diverticulosis, colon polyps, kidney stones,  female presents with shortness of braeth and chest pain. Patient states that around 330 she developed SOB and also had associated chest pain. Patient states that she took an inhaler which did not seem to help her. Patient also noted her heart rate was elevated. Patient states that she had palpitations. She states that she has some pain still but it is improving. She had taken her medications including [name] and blood thinners as usual this morining. She states the pain seemed to radiate down her left arm. She had some light headedness and also was dizzy. Patient states that her pressure also went down a little. She did not have any syncope. Patient also states that she had been seen by her PCP and was told she might have a UTI and has been on treatment for that.  1/23 states got up from bed walked over to sink and next thing she remembers she was on the floor (LOC). States struck her knees elbow and shoulder.    Objective: Filed Vitals:   09/07/14 2106 09/08/14 0419 09/08/14 0750 09/08/14 1102  BP: 129/70 142/68 136/69 125/71  Pulse: 55 54  59  Temp: 98.1 F (36.7 C) 98.4 F (36.9  C)  97.9 F (36.6 C)  TempSrc: Oral Oral  Oral  Resp: Height:      Weight:  83.2 kg (183 lb 6.8 oz)    SpO2: 94% 96%  96%    Intake/Output Summary (Last 24 hours) at 09/08/14 1524 Last data filed at 09/08/14 1250  Gross per 24 hour  Intake   1800 ml  Output     601 ml  Net   1199 ml   Filed Weights   09/05/14 2000 09/07/14 0400 09/08/14 0419  Weight: 84.641 kg (186 lb 9.6 oz) 84.823 kg (187 lb) 83.2 kg (183 lb 6.8 oz)     Exam: General: A/O 4, NAD, No acute respiratory distress Lungs: Clear to auscultation bilaterally without wheezes or crackles Cardiovascular: Regular rate and rhythm without murmur gallop or rub normal S1 and S2 Abdomen: Nontender, nondistended, soft, bowel sounds positive, no rebound, no ascites, no appreciable mass Extremities: No significant cyanosis, clubbing, or edema bilateral lower extremities   Data Reviewed: Basic Metabolic Panel:  Recent Labs Lab 09/05/14 1636 09/06/14 1026 09/07/14 0330 09/07/14 1325 09/08/14 0700  NA 140 139 141 139 140  K 3.6 3.7 4.4 4.7 4.2  CL 102 106 112 109 109  CO2 GLUCOSE 157* 152* 126* 164* 85  BUN CREATININE 1.97* 1.33* 1.20* 1.27* 1.22*  CALCIUM 10.5 9.1 8.9 9.1 8.9  MG 2.0  --   --   --   --    Liver Function Tests:  Recent Labs Lab 09/07/14 1325 09/08/14 0700  AST 26 19  ALT 15 16  ALKPHOS 85 70  BILITOT 0.1* 0.6  PROT 6.5 6.4  ALBUMIN 3.7 3.5    Recent Labs Lab 09/07/14 0341 09/08/14 0700  LIPASE 61* 48   No results for input(s): AMMONIA in the last 168 hours. CBC:  Recent Labs Lab 09/05/14 1636  WBC 5.6  HGB 14.8  HCT 43.4  MCV 88.8  PLT 183   Cardiac Enzymes:  Recent Labs Lab 09/05/14 2052 09/05/14 2225 09/06/14 0248  TROPONINI 0.03 0.03 0.04*   BNP (last 3 results)  Recent Labs  01/06/14 2020 08/02/14 0617  PROBNP 30.9 617.3*   CBG:  Recent Labs Lab 09/07/14 1203 09/07/14 1618 09/07/14 2103 09/08/14 0731 09/08/14 1118  GLUCAP 85 89 154* 79 97    Recent Results (from the past 240 hour(s))  MRSA PCR Screening     Status: None   Collection Time: 09/05/14  8:18 PM  Result Value Ref Range Status   MRSA by PCR NEGATIVE NEGATIVE Final    Comment:        The GeneXpert MRSA Assay  (FDA approved for NASAL specimens only), is one component of a comprehensive MRSA colonization surveillance program. It is not intended to diagnose MRSA infection nor to guide or monitor treatment for MRSA infections.   Clostridium Difficile by PCR     Status: None   Collection Time: 09/06/14  2:25 PM  Result Value Ref Range Status   C difficile by pcr NEGATIVE NEGATIVE Final     Studies: Ct Abdomen Pelvis Wo Contrast  09/06/2014   CLINICAL DATA:  Upper abdominal pain radiating to back with nausea and vomiting 2 days.  EXAM: CT ABDOMEN AND PELVIS WITHOUT CONTRAST  TECHNIQUE: Multidetector CT imaging of the abdomen and pelvis was performed following the standard protocol without IV contrast.  COMPARISON:  CT 08/02/2014  FINDINGS:  Lower chest:  Lung bases are clear.  Hepatobiliary: Non IV contrast images demonstrate no focal hepatic lesion. Gallbladder.  Pancreas: No clear pancreatic inflammation. There is faint haziness in the fat around the pancreas and adjacent to the celiac trunk.  Spleen: Normal spleen.  Adrenals/urinary tract: Adrenal glands normal. No nephrolithiasis or ureterolithiasis. Bladder normal.  Stomach/Bowel: Stomach, small bowel, appendix, cecum normal. Colon and right no sigmoid colon are normal.  Vascular/Lymphatic: Aorta is normal caliber. No retroperitoneal periportal lymphadenopathy.  Reproductive: Post hysterectomy anatomy.  Adnexal abnormality.  Musculoskeletal: Aggressive osseous lesion  IMPRESSION: 1. Faint haziness of peripancreatic fat. Recommend serum biochemical correlation for mild pancreatitis. 2.  No nephrolithiasis or ureterolithiasis. 3. Normal appendix.   Electronically Signed   By: Genevive Bi M.D.   On: 09/06/2014 16:06   Dg Elbow 2 Views Left  09/08/2014   CLINICAL DATA:  Left posterior elbow pain since falling today while trying to get into bed; no prior history of injury or surgery  EXAM: LEFT ELBOW - 2 VIEW  COMPARISON:  None.  FINDINGS: There is no  evidence of fracture, dislocation, or joint effusion. There is no evidence of arthropathy or other focal bone abnormality. Soft tissues are unremarkable.  IMPRESSION: Negative.   Electronically Signed   By: Amie Portland M.D.   On: 09/08/2014 14:24   Dg Knee 1-2 Views Left  09/08/2014   CLINICAL DATA:  Bilateral knee pain since falling today. No prior injury or relevant surgery. Initial encounter.  EXAM: LEFT KNEE - 1-2 VIEW  COMPARISON:  None.  FINDINGS: The mineralization and alignment are normal. There is no evidence of acute fracture or dislocation. The joint spaces are maintained. There is no significant joint effusion or focal soft tissue swelling.  IMPRESSION: No acute osseous findings.   Electronically Signed   By: Roxy Horseman M.D.   On: 09/08/2014 14:25   Dg Knee 1-2 Views Right  09/08/2014   CLINICAL DATA:  Right knee pain following fall getting into bed, initial encounter  EXAM: RIGHT KNEE - 1-2 VIEW  COMPARISON:  None.  FINDINGS: There is no evidence of fracture, dislocation, or joint effusion. There is no evidence of arthropathy or other focal bone abnormality. Soft tissues are unremarkable.  IMPRESSION: No acute abnormality noted.   Electronically Signed   By: Alcide Clever M.D.   On: 09/08/2014 14:25   Dg Lumbar Spine 1 View  09/08/2014   CLINICAL DATA:  Lower back pain after falling today, landing on buttocks  EXAM: LUMBAR SPINE - 1 VIEW  COMPARISON:  09/06/2014 CT  FINDINGS: There is no evidence of acute lumbar spine fracture. Sacrum and sacroiliac joints appear grossly intact, partially obscured due to contrast material from the recent CT.  IMPRESSION: Negative for acute fracture.   Electronically Signed   By: Ellery Plunk M.D.   On: 09/08/2014 14:26   Dg Shoulder Left  09/08/2014   CLINICAL DATA:  Pain after falling earlier today while trying to get into bed.  EXAM: LEFT SHOULDER - 2+ VIEW  COMPARISON:  None.  FINDINGS: There is no fracture, dislocation or radiopaque foreign body.  No significant arthropathic changes are evident. There is no bone lesion or bony destruction.  IMPRESSION: Negative for acute fracture.   Electronically Signed   By: Ellery Plunk M.D.   On: 09/08/2014 14:24   US Abdomen Limited Ruq  09/07/2014   CLINICAL DATA:  Abdominal pain  EXAM: US ABDOMEN LIMITED - RIGHT UPPER QUADRANT  COMPARISON:  CT scan 09/06/2014  FINDINGS: Gallbladder:  No gallstones are noted within gallbladder. No thickening of gallbladder wall. No sonographic Murphy's sign.  Common bile duct:  Diameter: 3.4 mm in diameter within normal limits.  Liver:  No focal hepatic mass. Mild increased echogenicity of the liver suspicious for fatty infiltration.  IMPRESSION: No gallstones are noted within gallbladder. Normal CBD. Question mild fatty infiltration of the liver.   Electronically Signed   By: Natasha MeadLiviu  Pop M.D.   On: 09/07/2014 19:59    Scheduled Meds: . aspirin EC  325 mg Oral Daily  . atorvastatin  10 mg Oral q1800  . carvedilol  12.5 mg Oral BID WC  . cholecalciferol  1,000 Units Oral BID  . diphenhydrAMINE  25 mg Oral QHS  . dofetilide  250 mcg Oral BID  . DULoxetine  60 mg Oral Daily  . ferrous sulfate  325 mg Oral Q breakfast  . gemfibrozil  600 mg Oral BID AC  . insulin aspart  0-15 Units Subcutaneous TID WC  . insulin glargine  40 Units Subcutaneous QHS  . levETIRAcetam  2,000 mg Oral QHS  . mometasone-formoterol  2 puff Inhalation BID  . niacin  500 mg Oral QHS  . pantoprazole  80 mg Oral Q1200  . potassium chloride  40 mEq Oral Daily  . pregabalin  100 mg Oral TID  . rivaroxaban  20 mg Oral Q supper   Continuous Infusions: . sodium chloride 50 mL/hr at 09/06/14 1729    Active Problems:   Diabetes mellitus   Essential hypertension   Sarcoid   Asthma   Atrial fibrillation with RVR   Chest pain   Abdominal pain   Acute renal failure syndrome   History of diverticulitis   Nausea vomiting and diarrhea    Time spent: 45 minutes    Izek Corvino,  J  [facility] Pager [phone]. If 7PM-7AM, please contact night-coverage at [email or web address], password [email or web address] 09/08/2014, 3:24 PM  LOS: 3 days

## 2014-09-09 DIAGNOSIS — W19XXXA Unspecified fall, initial encounter: Secondary | ICD-10-CM | POA: Diagnosis present

## 2014-09-09 LAB — GLUCOSE, CAPILLARY
GLUCOSE-CAPILLARY: 99 mg/dL (ref 70–99)
GLUCOSE-CAPILLARY: 118 mg/dL — AB (ref 70–99)
Glucose-Capillary: 156 mg/dL — ABNORMAL HIGH (ref 70–99)
Glucose-Capillary: 128 mg/dL — ABNORMAL HIGH (ref 70–99)

## 2014-09-09 LAB — TROPONIN I

## 2014-09-09 MED ORDER — INSULIN ASPART 100 UNIT/ML ~~LOC~~ SOLN: 0.0000 [IU] | Freq: Three times a day (TID) | SUBCUTANEOUS | Status: DC

## 2014-09-09 MED ORDER — INSULIN GLARGINE 100 UNIT/ML ~~LOC~~ SOLN
25.0000 [IU] | Freq: Every day | SUBCUTANEOUS | Status: DC
  Administered 2014-09-09 – 2014-09-10 (×2): 25 [IU] via SUBCUTANEOUS
  Filled 2014-09-09 (×3): qty 0.25

## 2014-09-09 MED ORDER — INSULIN ASPART 100 UNIT/ML ~~LOC~~ SOLN: 0.0000 [IU] | SUBCUTANEOUS | Status: DC

## 2014-09-09 NOTE — Progress Notes (Signed)
[facility] PROGRESS NOTE  [name] ZOX:[id number] DOB: [birth year] DOA: 09/05/2014 PCP: Geraldo Pitter, MD  Assessment/Plan: Syncope and collapse -Patient passed out this a.m. while walking from bed to sink to brush her teeth. Struck her knees elbow and shoulder, x-rays all negative for acute fracture. -Reviewed MAR  -Patient continues to be bradycardic and orthostatic at times. Will stop beta blockers -Obtain orthostatic vitals every shift -PT; recommends SNF, will place CSW consult -troponin 3 negative -TSH; within normal limit -Patient's CBGs have also been on the low side decrease Lantus to 25 units QHS  -Decrease SSI to sensitive   A fib RVR,  -Continue [name] 250 mg BID -Continue Xarelto 20 mg daily however may need to rethink this medication if patient continues to have falls.  Chronic Abdominal pains -PRN morphine discontinued -Continue patient's home medication of Percocet 5-3 25 TID PRN  -Change Zofran PO 4 mg,PRN nausea   chronic IBS; h/o IBS;  -exam unremarkable; colonoscopy (2013): diverticulosis  -CT abd: faint haziness of pancreatic fat ? Mild pancreatitis;  -no vomiting, no diarrhea; lipase 61; neg: c diff; TTGA -pend  -LFTs, within normal limits  -Abdominal ultrasound; nondiagnostic see below   DM, type 2 uncontrolled -07/2014 hemoglobin A1c =7.5  -Continue Lantus 30 units daily -DC Byetta as it can cause/exacerbate pancreatitis;   Acute kidney failure  -Baseline creatinine~1.1 to 1.2 -d/c metformin due to AKI/CKD;   Seizure disorder; - stable -Keppra 2000 mg QHS      Code Status: Full Family Communication: None (indicate person spoken with, relationship, and if by phone, the number) Disposition Plan: Next 24-48 hours   Consultants:   Procedures: 08/03/14 echocardiogram :Left ventricle: mild LVH. -LVEF= 60% to 65%. - Right atrium: The atrium was mildly dilated. 1/22 ultrasound abdomen RUQ;No gallstones are noted within  gallbladder. Normal CBD. Question mild fatty infiltration of the liver  Cultures NA  Antibiotics: NA  DVT prophylaxis Xarelto    HPI/Subjective: [name] is a 64 y.o. BF PMHx depression, fibromyalgia, anxiety, epilepsy, peripheral neuropathy, HTN, paroxysmal atrial fibrillation, MI, diabetes type 2, HLD, LVH, sarcoidosis of lung, irritable bowel syndrome, diverticulosis, colon polyps, kidney stones,  female presents with shortness of braeth and chest pain. Patient states that around 330 she developed SOB and also had associated chest pain. Patient states that she took an inhaler which did not seem to help her. Patient also noted her heart rate was elevated. Patient states that she had palpitations. She states that she has some pain still but it is improving. She had taken her medications including [name] and blood thinners as usual this morining. She states the pain seemed to radiate down her left arm. She had some light headedness and also was dizzy. Patient states that her pressure also went down a little. She did not have any syncope. Patient also states that she had been seen by her PCP and was told she might have a UTI and has been on treatment for that.  1/24 states got up from bed today and was able to walk around room and to sink without presyncope/syncope. However complains of abdominal pain even though patient has consumed breakfast and lunch with negative N/V/D     Objective: Filed Vitals:   09/08/14 1700 09/08/14 2029 09/09/14 0500 09/09/14 1411  BP: 164/59 124/58  149/54  Pulse: 51 71  55  Temp:  98.1 F (36.7 C) 98.3 F (36.8 C) 97.4 F (36.3 C)  TempSrc:  Oral Oral Oral  Resp:  18 18 19   Height:      Weight:   87.544 kg (193 lb)   SpO2:  96% 98% 99%    Intake/Output Summary (Last 24 hours) at 09/09/14 1520 Last data filed at 09/09/14 1243  Gross per 24 hour  Intake   1490 ml  Output   1552 ml  Net    -62 ml   Filed Weights   09/07/14 0400 09/08/14  0419 09/09/14 0500  Weight: 84.823 kg (187 lb) 83.2 kg (183 lb 6.8 oz) 87.544 kg (193 lb)     Exam: General: A/O 4, NAD, No acute respiratory distress Lungs: Clear to auscultation bilaterally without wheezes or crackles Cardiovascular: Regular rate and rhythm without murmur gallop or rub normal S1 and S2 Abdomen: Nontender, nondistended, soft, bowel sounds positive, no rebound, no ascites, no appreciable mass Extremities: No significant cyanosis, clubbing, or edema bilateral lower extremities   Data Reviewed: Basic Metabolic Panel:  Recent Labs Lab 09/05/14 1636 09/06/14 1026 09/07/14 0330 09/07/14 1325 09/08/14 0700  NA 140 139 141 139 140  K 3.6 3.7 4.4 4.7 4.2  CL 102 106 112 109 109  CO2 20 22 23 22 22   GLUCOSE 157* 152* 126* 164* 85  BUN 13 11 13 13 11   CREATININE 1.97* 1.33* 1.20* 1.27* 1.22*  CALCIUM 10.5 9.1 8.9 9.1 8.9  MG 2.0  --   --   --   --    Liver Function Tests:  Recent Labs Lab 09/07/14 1325 09/08/14 0700  AST 26 19  ALT 15 16  ALKPHOS 85 70  BILITOT 0.1* 0.6  PROT 6.5 6.4  ALBUMIN 3.7 3.5    Recent Labs Lab 09/07/14 0341 09/08/14 0700  LIPASE 61* 48   No results for input(s): AMMONIA in the last 168 hours. CBC:  Recent Labs Lab 09/05/14 1636  WBC 5.6  HGB 14.8  HCT 43.4  MCV 88.8  PLT 183   Cardiac Enzymes:  Recent Labs Lab 09/05/14 2225 09/06/14 0248 09/08/14 1627 09/08/14 2114 09/09/14 0330  TROPONINI 0.03 0.04* <0.03 <0.03 <0.03   BNP (last 3 results)  Recent Labs  01/06/14 2020 08/02/14 0617  PROBNP 30.9 617.3*   CBG:  Recent Labs Lab 09/08/14 1118 09/08/14 1618 09/08/14 2114 09/09/14 0724 09/09/14 1112  GLUCAP 97 133* 137* 99 156*    Recent Results (from the past 240 hour(s))  MRSA PCR Screening     Status: None   Collection Time: 09/05/14  8:18 PM  Result Value Ref Range Status   MRSA by PCR NEGATIVE NEGATIVE Final    Comment:        The GeneXpert MRSA Assay (FDA approved for NASAL  specimens only), is one component of a comprehensive MRSA colonization surveillance program. It is not intended to diagnose MRSA infection nor to guide or monitor treatment for MRSA infections.   Clostridium Difficile by PCR     Status: None   Collection Time: 09/06/14  2:25 PM  Result Value Ref Range Status   C difficile by pcr NEGATIVE NEGATIVE Final     Studies: Dg Elbow 2 Views Left  09/08/2014   CLINICAL DATA:  Left posterior elbow pain since falling today while trying to get into bed; no prior history of injury or surgery  EXAM: LEFT ELBOW - 2 VIEW  COMPARISON:  None.  FINDINGS: There is no evidence of fracture, dislocation, or joint effusion. There is no evidence of arthropathy or other focal bone abnormality. Soft tissues are unremarkable.  IMPRESSION: Negative.   Electronically Signed   By: Amie Portland M.D.   On: 09/08/2014 14:24   Dg Knee 1-2 Views Left  09/08/2014   CLINICAL DATA:  Bilateral knee pain since falling today. No prior injury or relevant surgery. Initial encounter.  EXAM: LEFT KNEE - 1-2 VIEW  COMPARISON:  None.  FINDINGS: The mineralization and alignment are normal. There is no evidence of acute fracture or dislocation. The joint spaces are maintained. There is no significant joint effusion or focal soft tissue swelling.  IMPRESSION: No acute osseous findings.   Electronically Signed   By: Roxy Horseman M.D.   On: 09/08/2014 14:25   Dg Knee 1-2 Views Right  09/08/2014   CLINICAL DATA:  Right knee pain following fall getting into bed, initial encounter  EXAM: RIGHT KNEE - 1-2 VIEW  COMPARISON:  None.  FINDINGS: There is no evidence of fracture, dislocation, or joint effusion. There is no evidence of arthropathy or other focal bone abnormality. Soft tissues are unremarkable.  IMPRESSION: No acute abnormality noted.   Electronically Signed   By: Alcide Clever M.D.   On: 09/08/2014 14:25   Dg Lumbar Spine 1 View  09/08/2014   CLINICAL DATA:  Lower back pain after falling  today, landing on buttocks  EXAM: LUMBAR SPINE - 1 VIEW  COMPARISON:  09/06/2014 CT  FINDINGS: There is no evidence of acute lumbar spine fracture. Sacrum and sacroiliac joints appear grossly intact, partially obscured due to contrast material from the recent CT.  IMPRESSION: Negative for acute fracture.   Electronically Signed   By: Ellery Plunk M.D.   On: 09/08/2014 14:26   Dg Shoulder Left  09/08/2014   CLINICAL DATA:  Pain after falling earlier today while trying to get into bed.  EXAM: LEFT SHOULDER - 2+ VIEW  COMPARISON:  None.  FINDINGS: There is no fracture, dislocation or radiopaque foreign body. No significant arthropathic changes are evident. There is no bone lesion or bony destruction.  IMPRESSION: Negative for acute fracture.   Electronically Signed   By: Ellery Plunk M.D.   On: 09/08/2014 14:24   US Abdomen Limited Ruq  09/07/2014   CLINICAL DATA:  Abdominal pain  EXAM: US ABDOMEN LIMITED - RIGHT UPPER QUADRANT  COMPARISON:  CT scan 09/06/2014  FINDINGS: Gallbladder:  No gallstones are noted within gallbladder. No thickening of gallbladder wall. No sonographic Murphy's sign.  Common bile duct:  Diameter: 3.4 mm in diameter within normal limits.  Liver:  No focal hepatic mass. Mild increased echogenicity of the liver suspicious for fatty infiltration.  IMPRESSION: No gallstones are noted within gallbladder. Normal CBD. Question mild fatty infiltration of the liver.   Electronically Signed   By: Natasha Mead M.D.   On: 09/07/2014 19:59    Scheduled Meds: . aspirin EC  325 mg Oral Daily  . atorvastatin  10 mg Oral q1800  . carvedilol  6.25 mg Oral BID WC  . cholecalciferol  1,000 Units Oral BID  . diphenhydrAMINE  25 mg Oral QHS  . dofetilide  250 mcg Oral BID  . DULoxetine  60 mg Oral Daily  . ferrous sulfate  325 mg Oral Q breakfast  . gemfibrozil  600 mg Oral BID AC  . insulin aspart  0-15 Units Subcutaneous TID WC  . insulin glargine  30 Units Subcutaneous QHS  .  levETIRAcetam  2,000 mg Oral QHS  . mometasone-formoterol  2 puff Inhalation BID  . niacin  500 mg Oral QHS  .  pantoprazole  80 mg Oral Q1200  . potassium chloride  40 mEq Oral Daily  . pregabalin  100 mg Oral TID  . rivaroxaban  20 mg Oral Q supper   Continuous Infusions: . sodium chloride 50 mL/hr (09/08/14 2344)    Active Problems:   Diabetes mellitus   Essential hypertension   Sarcoid   Asthma   Atrial fibrillation with RVR   Chest pain   Abdominal pain   Acute renal failure syndrome   History of diverticulitis   Nausea vomiting and diarrhea   Syncope and collapse   Abdominal pain, chronic, epigastric   Irritable bowel syndrome   Diabetes type 2, uncontrolled   Seizure disorder    Time spent: 45 minutes    Kaled Allende, J  [facility] Pager [phone]. If 7PM-7AM, please contact night-coverage at [email or web address], password [facility] 09/09/2014, 3:20 PM  LOS: 4 days     Care during the described time interval was provided by me. I have reviewed this patient's available data, including medical history, events of note, physical examination, radiology findings and test results as part of my evaluation  Carolyne Littles, MD [phone] Pager

## 2014-09-09 NOTE — Plan of Care (Addendum)
Problem: Phase III Progression Outcomes Goal: Pain controlled on oral analgesia Pt taking percocet 5mg  po Q8 hours PRN with good result also using Volartin gel to lower back ankles and knees with good result.

## 2014-09-09 NOTE — Evaluation (Signed)
Physical Therapy Evaluation Patient Details Name: Toni RosenthalLinda F Recore MRN: [id number] DOB: October 19, 1950 Today's Date: 09/09/2014   History of Present Illness  64 y.o. female admitted with afib with RVR, had episode of syncope with collapse during admission.  Clinical Impression  Pt admitted with the above complications. Pt currently with functional limitations due to the deficits listed below (see PT Problem List). Ambulates with decreased stability requiring min assist, and somewhat of a minimally ataxic type gait pattern. States she has had 2 recent falls at home prior to admission and does not have 24 hour supervision. Also reports she rarely ambulates at home since her prior admission due to fear of falling and feeling "funny" when she stands. Orthostatics taken and shown below. Does report dizziness with change in position; states she cannot lie flat at home due to SOB, dizziness, and headache.  Pt will benefit from skilled PT to increase their independence and safety with mobility to allow discharge to the venue listed below.       Follow Up Recommendations SNF;Supervision for mobility/OOB    Equipment Recommendations  None recommended by PT    Recommendations for Other Services       Precautions / Restrictions Precautions Precautions: Fall Restrictions Weight Bearing Restrictions: No      Mobility  Bed Mobility Overal bed mobility: Modified Independent                Transfers Overall transfer level: Needs assistance Equipment used: Rolling walker (2 wheeled) Transfers: Sit to/from Stand Sit to Stand: Min guard         General transfer comment: Min guard for safety. VC for hand placement. Mild sway noted, improved with stability from RW. Reports dizziness upon standing (see orthostatic vitals tab)  Ambulation/Gait Ambulation/Gait assistance: Min assist Ambulation Distance (Feet): 80 Feet Assistive device: Rolling walker (2 wheeled) Gait Pattern/deviations:  Step-through pattern;Decreased stride length;Ataxic;Leaning posteriorly;Narrow base of support Gait velocity: decreased   General Gait Details: Educated on safe DME use with a rolling walker. Min assist intermittently for stability with mildly ataxic type gait pattern. Continues to report dizziness and feeling "Funny" while ambulating. Fair control of RW but tends to have more difficulty navigating in narrow spaces.  Stairs            Wheelchair Mobility    Modified Rankin (Stroke Patients Only)       Balance Overall balance assessment: Needs assistance;History of Falls Sitting-balance support: No upper extremity supported;Feet supported Sitting balance-Leahy Scale: Good     Standing balance support: No upper extremity supported Standing balance-Leahy Scale: Fair                               Pertinent Vitals/Pain Pain Assessment: No/denies pain  Orthostatics:  Supine - BP 149/75, HR 60 Sitting - BP 140/61, HR 61 Stand - BP 126/58, HR 60  Of note, patient states she takes her own BP at home and her systolic pressure is typically in 80s.    Home Living Family/patient expects to be discharged to:: Private residence Living Arrangements: Other relatives (Granddaughter) Available Help at Discharge: Family (granddaughter in school) Type of Home: House Home Access: Stairs to enter Entrance Stairs-Rails: Doctor, general practiceight;Left Entrance Stairs-Number of Steps: 5 Home Layout: One level Home Equipment: Bedside commode;Shower seat;Walker - 2 wheels;Cane - single point      Prior Function Level of Independence: Needs assistance   Gait / Transfers Assistance Needed: RW to ambulate  ADL's / Homemaking Assistance Needed: granddaughter assists with bath/dress        Hand Dominance   Dominant Hand: Right    Extremity/Trunk Assessment   Upper Extremity Assessment: Defer to OT evaluation           Lower Extremity Assessment: Generalized weakness          Communication   Communication: No difficulties  Cognition Arousal/Alertness: Awake/alert Behavior During Therapy: WFL for tasks assessed/performed Overall Cognitive Status: Within Functional Limits for tasks assessed                      General Comments General comments (skin integrity, edema, etc.): (See orthostatic vitals section) Patient reports she does not ambulate much at home since her last admission because of her fear of falling and feeling unsteady on her feet.    Exercises        Assessment/Plan    PT Assessment Patient needs continued PT services  PT Diagnosis Difficulty walking;Abnormality of gait;Generalized weakness   PT Problem List Decreased strength;Decreased activity tolerance;Decreased balance;Decreased mobility;Decreased coordination;Decreased knowledge of use of DME;Decreased knowledge of precautions;Cardiopulmonary status limiting activity  PT Treatment Interventions DME instruction;Gait training;Functional mobility training;Therapeutic activities;Therapeutic exercise;Balance training;Stair training;Neuromuscular re-education;Patient/family education   PT Goals (Current goals can be found in the Care Plan section) Acute Rehab PT Goals Patient Stated Goal: Go to rehab PT Goal Formulation: With patient Time For Goal Achievement: 09/23/14 Potential to Achieve Goals: Good    Frequency Min 3X/week   Barriers to discharge Decreased caregiver support Granddaughter stays with pt but goes to school at [employer]    Co-evaluation               End of Session Equipment Utilized During Treatment: Gait belt Activity Tolerance: Patient tolerated treatment well Patient left: in chair;with call bell/phone within reach Nurse Communication: Mobility status;Other (comment) (orthostatic vitals)         Time: 1610-9604 PT Time Calculation (min) (ACUTE ONLY): 26 min   Charges:   PT Evaluation $Initial PT Evaluation Tier I: 1 Procedure PT  Treatments $Gait Training: 8-22 mins   PT G Codes:        Berton Mount 09/09/2014, 10:01 AM Charlsie Merles, PT [phone]

## 2014-09-10 LAB — CBC WITH DIFFERENTIAL/PLATELET
Basophils Absolute: 0 10*3/uL (ref 0.0–0.1)
Basophils Relative: 0 % (ref 0–1)
Eosinophils Absolute: 0.1 10*3/uL (ref 0.0–0.7)
Eosinophils Relative: 2 % (ref 0–5)
HCT: 32.2 % — ABNORMAL LOW (ref 36.0–46.0)
HEMOGLOBIN: 10.4 g/dL — AB (ref 12.0–15.0)
LYMPHS PCT: 41 % (ref 12–46)
Lymphs Abs: 1.5 10*3/uL (ref 0.7–4.0)
MCH: 28.7 pg (ref 26.0–34.0)
MCHC: 32.3 g/dL (ref 30.0–36.0)
MCV: 88.7 fL (ref 78.0–100.0)
Monocytes Absolute: 0.3 10*3/uL (ref 0.1–1.0)
Monocytes Relative: 9 % (ref 3–12)
Neutro Abs: 1.7 10*3/uL (ref 1.7–7.7)
Neutrophils Relative %: 48 % (ref 43–77)
Platelets: 146 10*3/uL — ABNORMAL LOW (ref 150–400)
RBC: 3.63 MIL/uL — AB (ref 3.87–5.11)
RDW: 13.2 % (ref 11.5–15.5)
WBC: 3.7 10*3/uL — AB (ref 4.0–10.5)

## 2014-09-10 LAB — COMPREHENSIVE METABOLIC PANEL
ALBUMIN: 3.5 g/dL (ref 3.5–5.2)
ALK PHOS: 76 U/L (ref 39–117)
ALT: 14 U/L (ref 0–35)
AST: 18 U/L (ref 0–37)
Anion gap: 8 (ref 5–15)
BILIRUBIN TOTAL: 0.3 mg/dL (ref 0.3–1.2)
BUN: 16 mg/dL (ref 6–23)
CALCIUM: 8.9 mg/dL (ref 8.4–10.5)
CO2: 25 mmol/L (ref 19–32)
CREATININE: 1.27 mg/dL — AB (ref 0.50–1.10)
Chloride: 108 mmol/L (ref 96–112)
GFR calc non Af Amer: 44 mL/min — ABNORMAL LOW (ref >90)
GFR, EST AFRICAN AMERICAN: 51 mL/min — AB (ref >90)
Glucose, Bld: 139 mg/dL — ABNORMAL HIGH (ref 70–99)
Potassium: 4.5 mmol/L (ref 3.5–5.1)
SODIUM: 141 mmol/L (ref 135–145)
Total Protein: 6.5 g/dL (ref 6.0–8.3)

## 2014-09-10 LAB — GLUCOSE, CAPILLARY
GLUCOSE-CAPILLARY: 120 mg/dL — AB (ref 70–99)
GLUCOSE-CAPILLARY: 112 mg/dL — AB (ref 70–99)
Glucose-Capillary: 108 mg/dL — ABNORMAL HIGH (ref 70–99)
Glucose-Capillary: 114 mg/dL — ABNORMAL HIGH (ref 70–99)

## 2014-09-10 LAB — MAGNESIUM: MAGNESIUM: 2 mg/dL (ref 1.5–2.5)

## 2014-09-10 NOTE — Care Management Note (Addendum)
    Page 1 of 1   09/11/2014     11:13:28 AM CARE MANAGEMENT NOTE 09/11/2014  Patient:  [name],[name]   Account Number:  [id number]  Date Initiated:  09/10/2014  Documentation initiated by:  GRAVES-BIGELOW,Danial Hlavac  Subjective/Objective Assessment:   Pt admitted for syncope and afib.     Action/Plan:   [facility] refusing SN[facility]. [facility] SOC to beign within 24-48 hours post d/c.   Anticipated DC Date:  09/10/2014   Anticipated DC Plan:  HOME W HOME HEALTH SERVICES      DC Planning Services  CM consult      [facility]   Choice offered to / List presented to:  [name]        HH arranged  HH-1 RN  HH-10 DISEASE MANAGEMENT      HH agency  [facility]   Status of service:  Completed, signed off Medicare Important Message given?  NO (If response is "NO", the following Medicare IM given date fields will be blank) Date Medicare IM given:   Medicare IM given by:   Date Additional Medicare IM given:   Additional Medicare IM given by:    Discharge Disposition:  HOME W HOME HEALTH SERVICES  Per UR Regulation:  Reviewed for med. necessity/level of care/duration of stay  If discussed at Long Length of Stay Meetings, dates discussed:   09/11/2014    Comments:  09-11-14 1111 Tomi BambergerBrenda Graves-Bigelow, RN,BSN [phone] [facility] due to no qualifying dx. Pt has Medicaid. No further needs from CM at this time.

## 2014-09-10 NOTE — Clinical Social Work Psychosocial (Signed)
     Clinical Social Work Department BRIEF PSYCHOSOCIAL ASSESSMENT 09/10/2014  Patient:  Toni RosenthalFLOWERS,Toni F     Account Number:  [id number]     Admit date:  09/05/2014  Clinical Social Worker:  Harless NakayamaAMBELAL,Burech Mcfarland, LCSWA  Date/Time:  09/10/2014 10:57 AM  Referred by:  Physician  Date Referred:  09/10/2014 Referred for  SNF Placement   Other Referral:   Interview type:  Patient Other interview type:    PSYCHOSOCIAL DATA Living Status:  ALONE Admitted from facility:   Level of care:   Primary support name:  [name] Primary support relationship to patient:  CHILD, ADULT Degree of support available:   Pt has strong family support.    CURRENT CONCERNS Current Concerns  Post-Acute Placement   Other Concerns:    SOCIAL WORK ASSESSMENT / PLAN CSW visited pt room and discussed SNF recommendation. Pt informed CSW that her granddaughter stays with her most days and assists with personal care and household chores. Pt informed CSW that she does not want to dc to SNF. Pt had multiple reasons but did express that the 30 day required stay as well as having to turn over her check are big reasons she does not want to dc to SNF. CSW encouraged pt to speak with her family and work out 24 hr care. Pt informed CSW this would not be an issue.    Upon second visit, pt informed CSW that her 3 grandchildren will be helping with her care. When her granddaughter cannot be with her, her other two grandchildren will assist. Pt daughters will help out as well.   Assessment/plan status:  No Further Intervention Required Other assessment/ plan:   Information/referral to community resources:   SNF list denied    PATIENTS/FAMILYS RESPONSE TO PLAN OF CARE: Pt pleasant, coopeartive, and understanding. Pt wanting to dc home.    [name], LCSWA  [phone]

## 2014-09-10 NOTE — Progress Notes (Signed)
[facility] PROGRESS NOTE  [name] ZOX:[id number] DOB: [birth year] DOA: 09/05/2014 PCP: Geraldo Pitter, MD   Brief history of present illness  Patient is a 64 year old female who presented with shortness of breath and chest pain. Patient reported that around 3:30 PM on the day of admission, she developed shortness of breath, associated chest pain. Patient took an inhaler which did not seem to help her. She also noted that her heart rate was elevated. She took her medications including [name] and blood thinners as usual. She felt that the pain was radiating down her left arm with dizziness and lightheadedness. Patient presented to the ED for further workup.   Assessment/Plan: Syncope and collapse Beta blockers were discontinued and patient felt orthostatic and dizzy. -troponin 3 negative -TSH; within normal limit -Patient's CBGs have also been on the low side decrease Lantus to 25 units QHS  -Decrease SSI to sensitive   A fib RVR, initially patient was placed on Cardizem drip, cardiology was consulted. Per cardiology, continue  [name] 250 mg BID -Continue Xarelto 20 mg daily however may need to rethink this medication if patient continues to have falls. - Per cardiology, could consider outpatient nuclear stress test, no further cardiac workup this time.  Acute on chronic Abdominal pains likely due to acute on chronic pancreatitisHistory of IBS CT abdomen and pelvis showed possible findings of mild pancreatitis, patient has had episodes of pancreatitis in the past. Patient continues to complain of abdominal discomfort, placed on clear liquid diet and IV fluid hydration. Abdominal ultrasound; nondiagnostic see below   DM, type 2 uncontrolled -07/2014 hemoglobin A1c =7.5  -Continue Lantus 30 units daily -DC [place] as it can cause/exacerbate pancreatitis;   Acute kidney failure  -Baseline creatinine~1.1 to 1.2 -d/c metformin due to AKI/CKD   Seizure disorder; -  stable -Keppra 2000 mg QHS   Code Status: Full  Family Communication: No family member at the bedside  Disposition Plan:Hopefully  Consultants:   Procedures: 08/03/14 echocardiogram :Left ventricle: mild LVH. -LVEF= 60% to 65%. - Right atrium: The atrium was mildly dilated. 1/22 ultrasound abdomen RUQ;No gallstones are noted within gallbladder. Normal CBD. Question mild fatty infiltration of the liver  Cultures NA  Antibiotics: NA  DVT prophylaxis Xarelto    Subjective:  Patient seen and examined, no chest pain however continues to complain of abdominal pain. Tolerating solid diet, no nausea or vomiting   Objective: BP 153/66 mmHg  Pulse 60  Temp(Src) 98.4 F (36.9 C) (Oral)  Resp 18  Ht  (1.676 m)  Wt 87.136 kg (192 lb 1.6 oz)  BMI 31.02 kg/m2  SpO2 98%   Intake/Output Summary (Last 24 hours) at 09/10/14 1336 Last data filed at 09/10/14 0945  Gross per 24 hour  Intake   1080 ml  Output   1075 ml  Net      5 ml   Filed Weights   09/08/14 0419 09/09/14 0500 09/10/14 0542  Weight: 83.2 kg (183 lb 6.8 oz) 87.544 kg (193 lb) 87.136 kg (192 lb 1.6 oz)     Exam: General: A/O 4, NAD, No acute respiratory distress Lungs: Clear to auscultation bilaterally without wheezes or crackles Cardiovascular: Regular rate and rhythm without murmur gallop or rub normal S1 and S2 Abdomen: Nontender, nondistended, soft, bowel sounds positive, no rebound, no ascites, no appreciable mass Extremities: No significant cyanosis, clubbing, or edema bilateral lower extremities   Data Reviewed: Basic Metabolic Panel:  Recent Labs Lab 09/05/14 1636 09/06/14 1026 09/07/14 0330 09/07/14 1325  09/08/14 0700 09/10/14 0430  NA 140 139 141 139 140 141  K 3.6 3.7 4.4 4.7 4.2 4.5  CL 102 106 112 109 109 108  CO2 GLUCOSE 157* 152* 126* 164* 85 139*  BUN CREATININE 1.97* 1.33* 1.20* 1.27* 1.22* 1.27*  CALCIUM 10.5 9.1 8.9 9.1 8.9 8.9   MG 2.0  --   --   --   --  2.0   Liver Function Tests:  Recent Labs Lab 09/07/14 1325 09/08/14 0700 09/10/14 0430  AST ALT ALKPHOS 85 70 76  BILITOT 0.1* 0.6 0.3  PROT 6.5 6.4 6.5  ALBUMIN 3.7 3.5 3.5    Recent Labs Lab 09/07/14 0341 09/08/14 0700  LIPASE 61* 48   No results for input(s): AMMONIA in the last 168 hours. CBC:  Recent Labs Lab 09/05/14 1636 09/10/14 0430  WBC 5.6 3.7*  NEUTROABS  --  1.7  HGB 14.8 10.4*  HCT 43.4 32.2*  MCV 88.8 88.7  PLT 183 146*   Cardiac Enzymes:  Recent Labs Lab 09/05/14 2225 09/06/14 0248 09/08/14 1627 09/08/14 2114 09/09/14 0330  TROPONINI 0.03 0.04* <0.03 <0.03 <0.03   BNP (last 3 results)  Recent Labs  01/06/14 2020 08/02/14 0617  PROBNP 30.9 617.3*   CBG:  Recent Labs Lab 09/09/14 1112 09/09/14 1616 09/09/14 2056 09/10/14 0742 09/10/14 1128  GLUCAP 156* 118* 128* 120* 108*    Recent Results (from the past 240 hour(s))  MRSA PCR Screening     Status: None   Collection Time: 09/05/14  8:18 PM  Result Value Ref Range Status   MRSA by PCR NEGATIVE NEGATIVE Final    Comment:        The GeneXpert MRSA Assay (FDA approved for NASAL specimens only), is one component of a comprehensive MRSA colonization surveillance program. It is not intended to diagnose MRSA infection nor to guide or monitor treatment for MRSA infections.   Clostridium Difficile by PCR     Status: None   Collection Time: 09/06/14  2:25 PM  Result Value Ref Range Status   C difficile by pcr NEGATIVE NEGATIVE Final     Studies: Dg Elbow 2 Views Left  09/08/2014   CLINICAL DATA:  Left posterior elbow pain since falling today while trying to get into bed; no prior history of injury or surgery  EXAM: LEFT ELBOW - 2 VIEW  COMPARISON:  None.  FINDINGS: There is no evidence of fracture, dislocation, or joint effusion. There is no evidence of arthropathy or other focal bone abnormality. Soft tissues are  unremarkable.  IMPRESSION: Negative.   Electronically Signed   By: Amie Portland M.D.   On: 09/08/2014 14:24   Dg Knee 1-2 Views Left  09/08/2014   CLINICAL DATA:  Bilateral knee pain since falling today. No prior injury or relevant surgery. Initial encounter.  EXAM: LEFT KNEE - 1-2 VIEW  COMPARISON:  None.  FINDINGS: The mineralization and alignment are normal. There is no evidence of acute fracture or dislocation. The joint spaces are maintained. There is no significant joint effusion or focal soft tissue swelling.  IMPRESSION: No acute osseous findings.   Electronically Signed   By: Roxy Horseman M.D.   On: 09/08/2014 14:25   Dg Knee 1-2 Views Right  09/08/2014   CLINICAL DATA:  Right knee pain following fall getting into bed, initial encounter  EXAM: RIGHT KNEE -  1-2 VIEW  COMPARISON:  None.  FINDINGS: There is no evidence of fracture, dislocation, or joint effusion. There is no evidence of arthropathy or other focal bone abnormality. Soft tissues are unremarkable.  IMPRESSION: No acute abnormality noted.   Electronically Signed   By: Alcide CleverMark  Lukens M.D.   On: 09/08/2014 14:25   Dg Lumbar Spine 1 View  09/08/2014   CLINICAL DATA:  Lower back pain after falling today, landing on buttocks  EXAM: LUMBAR SPINE - 1 VIEW  COMPARISON:  09/06/2014 CT  FINDINGS: There is no evidence of acute lumbar spine fracture. Sacrum and sacroiliac joints appear grossly intact, partially obscured due to contrast material from the recent CT.  IMPRESSION: Negative for acute fracture.   Electronically Signed   By: Ellery Plunkaniel R Mitchell M.D.   On: 09/08/2014 14:26   Dg Shoulder Left  09/08/2014   CLINICAL DATA:  Pain after falling earlier today while trying to get into bed.  EXAM: LEFT SHOULDER - 2+ VIEW  COMPARISON:  None.  FINDINGS: There is no fracture, dislocation or radiopaque foreign body. No significant arthropathic changes are evident. There is no bone lesion or bony destruction.  IMPRESSION: Negative for acute fracture.    Electronically Signed   By: Ellery Plunkaniel R Mitchell M.D.   On: 09/08/2014 14:24    Scheduled Meds: . aspirin EC  325 mg Oral Daily  . atorvastatin  10 mg Oral q1800  . cholecalciferol  1,000 Units Oral BID  . diphenhydrAMINE  25 mg Oral QHS  . dofetilide  250 mcg Oral BID  . DULoxetine  60 mg Oral Daily  . ferrous sulfate  325 mg Oral Q breakfast  . gemfibrozil  600 mg Oral BID AC  . insulin aspart  0-9 Units Subcutaneous TID WC  . insulin glargine  25 Units Subcutaneous QHS  . levETIRAcetam  2,000 mg Oral QHS  . mometasone-formoterol  2 puff Inhalation BID  . niacin  500 mg Oral QHS  . pantoprazole  80 mg Oral Q1200  . potassium chloride  40 mEq Oral Daily  . pregabalin  100 mg Oral TID  . rivaroxaban  20 mg Oral Q supper    [name],Toni M.D. [facility] 09/10/2014, 1:47 PM  Pager: [phone]

## 2014-09-10 NOTE — Progress Notes (Signed)
Physical Therapy Treatment Patient Details Name: [name] MRN: [id number] DOB: [birth year] Today's Date: 09/10/2014    History of Present Illness 64 y.o. female admitted to [facility] on 09/05/14 with afib with RVR, had episode of syncope with collapse during admission.  Pt with significant PMHx of PAF, depression, asthma, sarcoidosis of lung, fibromyalgia, DM 2 with peirpheral neuropathy, migraine, epilepsy, chronic back pain, anxiety, L ankle fx surgery, ACDF, bil carpal tunnel and bil ulnar nerve trasposition.    PT Comments    Pt is progressing well with gait and mobility.  She would continue to be safer to use RW as she was holding IV pole and therapist's hand for support during gait today.  Pt refusing SNF placement, but has arranged 24/7 assist from multiple grandchildren at discharge.  HEP given for LE and UE exercises.  Pt would benefit from HHPT, but has limited therapy benefits, so will need to ensure compliance with acute care HEP at home.  Pt reports her caregivers will, "make sure I do them".  We also talked at length about general activity and exercise progression including getting permission for PCP before starting a gym exercise routine.    Follow Up Recommendations  Home health PT (would be ideal, however limited therapy benefits)     Equipment Recommendations  None recommended by PT    Recommendations for Other Services   NA     Precautions / Restrictions Precautions Precautions: Fall    Mobility  Bed Mobility Overal bed mobility: Modified Independent                Transfers Overall transfer level: Needs assistance Equipment used: Rolling walker (2 wheeled) Transfers: Sit to/from Stand Sit to Stand: Supervision         General transfer comment: supervision for safety  Ambulation/Gait Ambulation/Gait assistance: Min guard Ambulation Distance (Feet): 150 Feet Assistive device: Rolling walker (2 wheeled) Gait Pattern/deviations: Step-through  pattern;Staggering left;Staggering right;Antalgic Gait velocity: decreased Gait velocity interpretation: Below normal speed for age/gender General Gait Details: Pt with mildly abnormal gait pattern, staggering, almost ataxic-like, but may be related to poor sensation in bil feet from diabetic neuropathy.         Balance Overall balance assessment: Needs assistance Sitting-balance support: Feet supported;No upper extremity supported Sitting balance-Leahy Scale: Good     Standing balance support: Single extremity supported;No upper extremity supported;Bilateral upper extremity supported Standing balance-Leahy Scale: Fair                      Cognition Arousal/Alertness: Awake/alert Behavior During Therapy: WFL for tasks assessed/performed Overall Cognitive Status: Within Functional Limits for tasks assessed                      Exercises General Exercises - Upper Extremity Shoulder Flexion: AROM;Both;10 reps;Seated Elbow Flexion: AROM;Both;10 reps;Seated General Exercises - Lower Extremity Long Arc Quad: AROM;Both;10 reps;Seated Hip ABduction/ADduction: AROM;Both;10 reps;Seated (adduction only against pillow for resistance) Hip Flexion/Marching: AROM;Both;10 reps;Seated Toe Raises: AROM;Both;10 reps;Seated Heel Raises: AROM;Both;10 reps;Seated Other Exercises Other Exercises: HEP handout of above listed exercises given to pt        Pertinent Vitals/Pain Pain Assessment: Faces Faces Pain Scale: Hurts a little bit Pain Location: bil knees from her inpatient fall per pt report Pain Descriptors / Indicators: Aching;Burning Pain Intervention(s): Limited activity within patient's tolerance;Monitored during session;Repositioned           PT Goals (current goals can now be found in the care plan  section) Acute Rehab PT Goals Patient Stated Goal: to do what she has to do to get better.  She would like to be able to walk to the store.  Progress towards PT  goals: Progressing toward goals    Frequency  Min 3X/week    PT Plan Discharge plan needs to be updated       End of Session Equipment Utilized During Treatment: Gait belt Activity Tolerance: Patient tolerated treatment well Patient left: in chair;with call bell/phone within reach     Time: 1230-1305 PT Time Calculation (min) (ACUTE ONLY): 35 min  Charges:  $Gait Training: 8-22 mins $Therapeutic Exercise: 8-22 mins                      [name] B. Marianna Cid, PT, DPT [phone]   09/10/2014, 4:41 PM

## 2014-09-10 NOTE — Evaluation (Signed)
Occupational Therapy Evaluation Patient Details Name: [name] MRN: [id number] DOB: 1951-08-03 Today's Date: 09/10/2014    History of Present Illness 64 y.o. female admitted with afib with RVR, had episode of syncope with collapse during admission.   Clinical Impression   Pt admitted with above. Feel pt will benefit from acute OT to increase strength and independence prior to d/c. Pt states that it has been worked out for family to assist her as needed at home 24/7.    Follow Up Recommendations  No OT follow up;Supervision/Assistance - 24 hour    Equipment Recommendations  None recommended by OT    Recommendations for Other Services       Precautions / Restrictions Precautions Precautions: Fall Restrictions Weight Bearing Restrictions: No      Mobility Bed Mobility Overal bed mobility: Modified Independent                Transfers Overall transfer level: Needs assistance Equipment used: Rolling walker (2 wheeled) Transfers: Sit to/from Stand Sit to Stand: Min guard                   ADL Overall ADL's : Needs assistance/impaired     Grooming: Oral care;Set up;Supervision/safety;Standing               Lower Body Dressing: Min guard;Sit to/from stand   Toilet Transfer: Min guard;Ambulation;Stand-pivot;RW (performed stand pivot without walker)   Toileting- Clothing Manipulation and Hygiene: Min guard;Sit to/from stand       Functional mobility during ADLs: Min guard;Rolling walker-pt walked a little without walker, but appears more steady with walker. General ADL Comments: Educated on safety such as rugs/items on floor, use of bag on walker, safe shoewear,sitting for most of LB ADLs. Recommended someone be with her for tub transfer and briefly discussed technique. Recommended pt use walker for now. Discussed d/c plan of not recommending HHOT as pt states she has 24/7 supervision/assist worked out with family.     Vision                      Perception     Praxis      Pertinent Vitals/Pain Pain Assessment: 0-10 Pain Score: 8  Pain Location: legs Pain Descriptors / Indicators: Burning;Throbbing Pain Intervention(s): Monitored during session     Hand Dominance Right   Extremity/Trunk Assessment Upper Extremity Assessment Upper Extremity Assessment: Overall WFL for tasks assessed   Lower Extremity Assessment Lower Extremity Assessment: Defer to PT evaluation       Communication Communication Communication: No difficulties   Cognition Arousal/Alertness: Awake/alert Behavior During Therapy: WFL for tasks assessed/performed Overall Cognitive Status: Within Functional Limits for tasks assessed                     General Comments       Exercises       Shoulder Instructions      Home Living Family/patient expects to be discharged to:: Private residence Living Arrangements: Other relatives (granddaughter) Available Help at Discharge: Family (says they have worked out Careers information officer) Type of Home: House Home Access: Stairs to enter Secretary/administrator of Steps: 5 Entrance Stairs-Rails: Right;Left Home Layout: One level     Bathroom Shower/Tub: Tub/shower unit Shower/tub characteristics: Engineer, building services: Standard Bathroom Accessibility: Yes How Accessible: Accessible via walker Home Equipment: Bedside commode;Walker - 2 wheels;Cane - single point          Prior Functioning/Environment Level of Independence: Needs assistance  Gait / Transfers Assistance Needed: RW to ambulate ADL's / Homemaking Assistance Needed: granddaughter assists with bath/dress        OT Diagnosis: Generalized weakness;Acute pain   OT Problem List: Decreased strength;Decreased activity tolerance;Impaired balance (sitting and/or standing);Decreased knowledge of use of DME or AE;Decreased knowledge of precautions;Pain   OT Treatment/Interventions: Self-care/ADL training;DME and/or AE  instruction;Therapeutic activities;Patient/family education;Balance training    OT Goals(Current goals can be found in the care plan section) Acute Rehab OT Goals Patient Stated Goal: not stated OT Goal Formulation: With patient Time For Goal Achievement: 09/17/14 Potential to Achieve Goals: Good ADL Goals Pt Will Transfer to Toilet: ambulating;with modified independence Pt Will Perform Tub/Shower Transfer: Tub transfer;with supervision;ambulating;rolling walker;3 in 1  OT Frequency: Min 2X/week   Barriers to D/C:            Co-evaluation              End of Session Equipment Utilized During Treatment: Gait belt;Rolling walker Nurse Communication: Mobility status (became woozy)  Activity Tolerance: Other (comment);Patient limited by fatigue Patient left: in bed;with call bell/phone within reach;with bed alarm set   Time: [id number] OT Time Calculation (min): 16 min Charges:  OT General Charges $OT Visit: 1 Procedure OT Evaluation $Initial OT Evaluation Tier I: 1 Procedure G-CodesEarlie Raveling:    Caidan Hubbert L OTR/L [phone] 09/10/2014, 10:53 AM

## 2014-09-11 LAB — LIPASE, BLOOD: LIPASE: 88 U/L — AB (ref 11–59)

## 2014-09-11 LAB — GLUCOSE, CAPILLARY
GLUCOSE-CAPILLARY: 84 mg/dL (ref 70–99)
Glucose-Capillary: 88 mg/dL (ref 70–99)

## 2014-09-11 MED ORDER — INSULIN GLARGINE 100 UNIT/ML SOLOSTAR PEN: 25.0000 [IU] | PEN_INJECTOR | Freq: Every day | SUBCUTANEOUS | Status: DC

## 2014-09-11 NOTE — Discharge Summary (Signed)
Physician Discharge Summary  Patient ID: Toni RosenthalLinda F [name] MRN: [id number] DOB/AGE: 63/20/1952 63 y.o.  Admit date: 09/05/2014 Discharge date: 09/11/2014  Primary Care Physician:  [name],VEITA J, MD  Discharge Diagnoses:    . Atrial fibrillation with RVR . acute on chronic epigastric abdominal pain  . syncopal episode  . Sarcoid . Chest pain . Abdominal pain . Acute renal failure syndrome . Nausea vomiting and diarrhea . essential hypertension  . asthma  . Irritable bowel syndrome . Diabetes type 2, uncontrolled . Seizure disorder . Fall  Consults: Cardiology   Recommendations for Outpatient Follow-up:  Patient was recommended to discontinue Byetta as it worsens as well as causes pancreatitis. She has a history of chronic pancreatitis. Due to her history of chronic kidney disease, metformin was also discontinued. Patient has been doing well on Lantus only.  Patient will benefit from endocrinology referral    DIET: Low-fat, low-carb diet    Allergies:   Allergies  Allergen Reactions  . Amitriptyline Other (See Comments)    Disoriented.  . Hydromorphone Hcl Other (See Comments)    blood pressure drops.  . Prednisone     SENT INTO AFIB      Discharge Medications:   Medication List    STOP taking these medications        BYETTA 10 MCG PEN 10 MCG/0.04ML Sopn injection  Generic drug:  exenatide     carvedilol 12.5 MG tablet  Commonly known as:  COREG     cefUROXime 500 MG tablet  Commonly known as:  CEFTIN     gabapentin 100 MG capsule  Commonly known as:  NEURONTIN     metFORMIN 500 MG tablet  Commonly known as:  GLUCOPHAGE      TAKE these medications        albuterol 108 (90 BASE) MCG/ACT inhaler  Commonly known as:  PROVENTIL HFA;VENTOLIN HFA  Inhale 2 puffs into the lungs every 4 (four) hours as needed for wheezing or shortness of breath.     atorvastatin 10 MG tablet  Commonly known as:  LIPITOR  Take 10 mg by mouth daily.     calcium  carbonate 600 MG Tabs tablet  Commonly known as:  OS-CAL  Take 1 tablet (600 mg total) by mouth daily.     cholecalciferol 1000 UNITS tablet  Commonly known as:  VITAMIN D  Take 1,000 Units by mouth 2 (two) times daily.     Cranberry 500 MG Caps  Take 500 mg by mouth 2 (two) times daily.     docusate sodium 100 MG capsule  Commonly known as:  COLACE  Take 100 mg by mouth 2 (two) times daily as needed for constipation. For stool softner     dofetilide 250 MCG capsule  Commonly known as:  [name]  Take 1 capsule (250 mcg total) by mouth 2 (two) times daily.     DULERA 200-5 MCG/ACT Aero  Generic drug:  mometasone-formoterol  Inhale 2 puffs into the lungs 2 (two) times daily.     DULoxetine 60 MG capsule  Commonly known as:  CYMBALTA  Take 60 mg by mouth daily.     ferrous sulfate 325 (65 FE) MG tablet  Take 325 mg by mouth daily with breakfast.     Insulin Glargine 100 UNIT/ML Solostar Pen  Commonly known as:  LANTUS SOLOSTAR  Inject 25 Units into the skin at bedtime.     levETIRAcetam 500 MG 24 hr tablet  Commonly known as:  KEPPRA XR  Take 4 tablets (2,000 mg total) by mouth at bedtime.     loratadine 10 MG tablet  Commonly known as:  CLARITIN  Take 10 mg by mouth every morning.     NEXIUM 40 MG capsule  Generic drug:  esomeprazole  take 1 capsule by mouth twice a day WITH MEALS     niacin 500 MG CR tablet  Commonly known as:  NIASPAN  Take 500 mg by mouth at bedtime.     nitroGLYCERIN 0.4 MG SL tablet  Commonly known as:  NITROSTAT  Place 0.4 mg under the tongue every 5 (five) minutes as needed for chest pain.     ondansetron 8 MG disintegrating tablet  Commonly known as:  ZOFRAN-ODT  Take 8 mg by mouth every 6 (six) hours as needed for nausea.     oxyCODONE-acetaminophen 10-325 MG per tablet  Commonly known as:  PERCOCET  Take 1 tablet by mouth every 8 (eight) hours as needed for pain. For pain     potassium chloride 10 MEQ tablet  Commonly known as:   K-DUR  Take 1 tablet (10 mEq total) by mouth daily.     pregabalin 100 MG capsule  Commonly known as:  LYRICA  Take 100 mg by mouth 3 (three) times daily.     rivaroxaban 20 MG Tabs tablet  Commonly known as:  XARELTO  Take 1 tablet (20 mg total) by mouth daily with supper.     vitamin C 500 MG tablet  Commonly known as:  ASCORBIC ACID  Take 500 mg by mouth 2 (two) times daily.     VOLTAREN 1 % Gel  Generic drug:  diclofenac sodium  Apply 2 g topically 4 (four) times daily as needed. For pain         Brief H and P: For complete details please refer to admission H and P, but in brief Patient is a 64 year old female who presented with shortness of breath and chest pain. Patient reported that around 3:30 PM on the day of admission, she developed shortness of breath, associated chest pain. Patient took an inhaler which did not seem to help her. She also noted that her heart rate was elevated. She took her medications including [name] and blood thinners as usual. She felt that the pain was radiating down her left arm with dizziness and lightheadedness. Patient presented to the ED for further workup.  Hospital Course:  Syncope and collapse: Likely due to orthostasis and bradycardia. The patient was ruled out for acute ACS, troponins negative, TSH within normal limits. Patient's CBGs also were on lower side hence her Lantus was decreased to 25 units, Byetta was discontinued. Coreg was discontinued due to bradycardia, heart rate in low 50s.   Atrial fib with RVR, initially patient was placed on Cardizem drip, cardiology was consulted. Per cardiology, continueTikosyn 250 mg BID. Continue xarelto. Per cardiology, could consider outpatient nuclear stress test, no further cardiac workup this time.  Acute on chronic Abdominal pains likely due to acute on chronic pancreatitis, History of IBS CT abdomen and pelvis showed possible findings of mild pancreatitis, patient has had episodes of  pancreatitis in the past. Byetta was discontinued as it worsens pancreatitis. Patient was placed on clear liquid diet and IV fluid hydration, she also has history of chronic abdominal pain. She is tolerating solids at discharge.  DM, type 2 uncontrolled -07/2014 hemoglobin A1c =7.5  -Continue Lantus 25 units daily (instead of 40 units that patient was taking at home), Byetta  was discontinued due to concern of worsening pancreatitis   Acute kidney failure  -Baseline creatinine~1.1 to 1.2, metformin was discontinued due to AKI/CKD   Seizure disorder; - stable, continue Keppra 2000 mg QHS    Day of Discharge BP 143/76 mmHg  Pulse 53  Temp(Src) 98.7 F (37.1 C) (Oral)  Resp 18  Ht  (1.676 m)  Wt 86.9 kg (191 lb 9.3 oz)  BMI 30.94 kg/m2  SpO2 97%  Physical Exam: General: Alert and awake oriented x3 not in any acute distress. HEENT: anicteric sclera, pupils reactive to light and accommodation CVS: S1-S2 clear no murmur rubs or gallops Chest: clear to auscultation bilaterally, no wheezing rales or rhonchi Abdomen: soft nontender, nondistended, normal bowel sounds Extremities: no cyanosis, clubbing or edema noted bilaterally Neuro: Cranial nerves II-XII intact, no focal neurological deficits   The results of significant diagnostics from this hospitalization (including imaging, microbiology, ancillary and laboratory) are listed below for reference.    LAB RESULTS: Basic Metabolic Panel:  Recent Labs Lab 09/08/14 0700 09/10/14 0430  NA 140 141  K 4.2 4.5  CL 109 108  CO2 22 25  GLUCOSE 85 139*  BUN 11 16  CREATININE 1.22* 1.27*  CALCIUM 8.9 8.9  MG  --  2.0   Liver Function Tests:  Recent Labs Lab 09/08/14 0700 09/10/14 0430  AST 19 18  ALT 16 14  ALKPHOS 70 76  BILITOT 0.6 0.3  PROT 6.4 6.5  ALBUMIN 3.5 3.5    Recent Labs Lab 09/08/14 0700 09/11/14 0405  LIPASE 48 88*   No results for input(s): AMMONIA in the last 168 hours. CBC:  Recent  Labs Lab 09/05/14 1636 09/10/14 0430  WBC 5.6 3.7*  NEUTROABS  --  1.7  HGB 14.8 10.4*  HCT 43.4 32.2*  MCV 88.8 88.7  PLT 183 146*   Cardiac Enzymes:  Recent Labs Lab 09/08/14 2114 09/09/14 0330  TROPONINI <0.03 <0.03   BNP: Invalid input(s): POCBNP CBG:  Recent Labs Lab 09/11/14 0744 09/11/14 1133  GLUCAP 84 88    Significant Diagnostic Studies:  Ct Abdomen Pelvis Wo Contrast  09/06/2014   CLINICAL DATA:  Upper abdominal pain radiating to back with nausea and vomiting 2 days.  EXAM: CT ABDOMEN AND PELVIS WITHOUT CONTRAST  TECHNIQUE: Multidetector CT imaging of the abdomen and pelvis was performed following the standard protocol without IV contrast.  COMPARISON:  CT 08/02/2014  FINDINGS: Lower chest:  Lung bases are clear.  Hepatobiliary: Non IV contrast images demonstrate no focal hepatic lesion. Gallbladder.  Pancreas: No clear pancreatic inflammation. There is faint haziness in the fat around the pancreas and adjacent to the celiac trunk.  Spleen: Normal spleen.  Adrenals/urinary tract: Adrenal glands normal. No nephrolithiasis or ureterolithiasis. Bladder normal.  Stomach/Bowel: Stomach, small bowel, appendix, cecum normal. Colon and right no sigmoid colon are normal.  Vascular/Lymphatic: Aorta is normal caliber. No retroperitoneal periportal lymphadenopathy.  Reproductive: Post hysterectomy anatomy.  Adnexal abnormality.  Musculoskeletal: Aggressive osseous lesion  IMPRESSION: 1. Faint haziness of peripancreatic fat. Recommend serum biochemical correlation for mild pancreatitis. 2.  No nephrolithiasis or ureterolithiasis. 3. Normal appendix.   Electronically Signed   By: Genevive Bi M.D.   On: 09/06/2014 16:06   Dg Chest Portable 1 View  09/05/2014   CLINICAL DATA:  Chest pain. Shortness of breath. Nausea. Sarcoidosis.  EXAM: PORTABLE CHEST - 1 VIEW  COMPARISON:  08/02/2014  FINDINGS: Mild fullness of the left hilum. Otherwise the chest appears unremarkable. The lungs  are clear.  IMPRESSION: 1. Mild fullness of the left hilum likely representing adenopathy in the setting of known sarcoidosis. Otherwise, no significant abnormalities are observed.   Electronically Signed   By: Herbie Baltimore M.D.   On: 09/05/2014 17:37    2D ECHO:   Disposition and Follow-up: Discharge Instructions    Diet Carb Modified    Complete by:  As directed      Discharge instructions    Complete by:  As directed   Please HOLD Byetta, it worsens pancreatitis. Discuss with your PCP or endocrinologist.   It is VERY IMPORTANT that you follow up with a PCP on a regular basis.  Check your blood glucoses before each meal and at bedtime and maintain a log of your readings.  Bring this log with you when you follow up with your PCP so that he or she can adjust your insulin at your follow up visit.     Increase activity slowly    Complete by:  As directed             DISPOSITION: Home   DISCHARGE FOLLOW-UP Follow-up Information    Follow up with [facility].   Why:  Home Health Services Registered Nurse   Contact information:   [address] [place] [address] [phone]       Follow up with Geraldo Pitter, MD. Schedule an appointment as soon as possible for a visit in 2 weeks.   Specialty:  Family Medicine   Why:  for hospital follow-up   Contact information:   [address] [place] [address] [phone]        Time spent on Discharge: 35 mins  Signed:   Serinity Ware M.D. [facility] 09/11/2014, 11:46 AM Pager: [phone]

## 2014-09-11 NOTE — Progress Notes (Signed)
Occupational Therapy Treatment Patient Details Name: [name] MRN: [id number] DOB: Nov 20, 1950 Today's Date: 09/11/2014    History of present illness 64 y.o. female admitted to [facility] on 09/05/14 with afib with RVR, had episode of syncope with collapse during admission.  Pt with significant PMHx of PAF, depression, asthma, sarcoidosis of lung, fibromyalgia, DM 2 with peirpheral neuropathy, migraine, epilepsy, chronic back pain, anxiety, L ankle fx surgery, ACDF, bil carpal tunnel and bil ulnar nerve trasposition.   OT comments  Education provided to pt. Pt progressing toward goals.  Follow Up Recommendations  No OT follow up;Supervision/Assistance - 24 hour    Equipment Recommendations  None recommended by OT    Recommendations for Other Services      Precautions / Restrictions Precautions Precautions: Fall Restrictions Weight Bearing Restrictions: No       Mobility Bed Mobility Overal bed mobility: Modified Independent                Transfers Overall transfer level: Needs assistance Equipment used: Rolling walker (2 wheeled) Transfers: Sit to/from Stand Sit to Stand: Supervision;Min guard                  ADL Overall ADL's : Needs assistance/impaired     Grooming: Wash/dry face;Oral care;Set up;Supervision/safety;Standing   Upper Body Bathing: Set up;Supervision/ safety;Standing   Lower Body Bathing: Sit to/from stand;Set up;Supervison/ safety   Upper Body Dressing : Set up;Supervision/safety;Sitting;Standing   Lower Body Dressing: Set up;Sitting/lateral leans (socks)   Toilet Transfer: Min guard;Ambulation;BSC   Toileting- Architect and Hygiene: Sit to/from stand;Supervision/safety       Functional mobility during ADLs: Min guard;Rolling walker General ADL Comments: Reviewed safety information such as safe shoewear, use of bag on walker, sitting for LB ADLs. Practiced simulated tub transfer. Explained benefit of therapy. Pt with  generalized weakness in UE's-gave theraband and performed UE exercises. Became dizzy at sink so sat for a little bit.  Pt ambulated in hallway. MD came in and OT told her d/c recommendations.       Vision                     Perception     Praxis      Cognition  Awake/Alert Behavior During Therapy: WFL for tasks assessed/performed Overall Cognitive Status: Within Functional Limits for tasks assessed                       Extremity/Trunk Assessment               Exercises Other Exercises Other Exercises: performed theraband exercises with level 1 theraband   Shoulder Instructions       General Comments      Pertinent Vitals/ Pain       Pain Assessment: 0-10 Pain Score: 6  Pain Location: back and stomach Pain Descriptors / Indicators: Aching;Burning;Throbbing Pain Intervention(s): Monitored during session  Home Living                                          Prior Functioning/Environment              Frequency Min 2X/week     Progress Toward Goals  OT Goals(current goals can now be found in the care plan section)  Progress towards OT goals: Progressing toward goals  Acute Rehab OT Goals Patient Stated Goal:  go home OT Goal Formulation: With patient Time For Goal Achievement: 09/17/14 Potential to Achieve Goals: Good ADL Goals Pt Will Transfer to Toilet: ambulating;with modified independence Pt Will Perform Tub/Shower Transfer: Tub transfer;with supervision;ambulating;rolling walker;3 in 1 Additional ADL Goal #1: Pt will perform ADLs at supervision level (including gathering supplies) Additional ADL Goal #2: Pt will independently perform HEP for bilateral UE's to increase strength.  Plan Discharge plan remains appropriate    Co-evaluation                 End of Session Equipment Utilized During Treatment: Gait belt;Rolling walker   Activity Tolerance Patient limited by fatigue   Patient Left in  bed;with call bell/phone within reach   Nurse Communication          Time: [id number] OT Time Calculation (min): 32 min  Charges: OT General Charges $OT Visit: 1 Procedure OT Treatments $Self Care/Home Management : 8-22 mins $Therapeutic Activity: 8-22 mins  Earlie RavelingStraub, Pietra Zuluaga L  OTR/L [phone]  09/11/2014, 12:59 PM

## 2014-09-11 NOTE — Progress Notes (Signed)
Responded to unit page to assist patient with completion of advance directives.  Directives were completed and notarized.  Copies were made and given to patient and to nurse for patient's chart.  Will follow as needed.

## 2014-09-17 ENCOUNTER — Telehealth: Payer: Self-pay | Admitting: Cardiovascular Disease

## 2014-09-17 DIAGNOSIS — I959 Hypotension, unspecified: Secondary | ICD-10-CM

## 2014-09-17 NOTE — Telephone Encounter (Signed)
PER  PT IS  CALLING  DUE   TO  B/P  DROPPING   20 POINTS WHILE I N  HOSPITAL   FROM LYING POSITION TO  STANDING   DOES  HAVE  SOME  DIZZINESS  ALSO   WAS  DX  WITH   PANCREATITIS  AND  KIDNEY  FAILURE   NOT  HAPPY WITH  OTHER  MD'S  NOT SURE  WHAT  NEEDS TO BE DONE  WILL FORWARD  TO  DR Eden EmmsNISHAN  FOR  REVIEW .Zack Seal/CY

## 2014-09-17 NOTE — Telephone Encounter (Signed)
BP dropping is from autonomic dysfunction from diabetes.  They adjusted her meds in hospital  Not on anything now to lower BP  Needs outpatient f/u lexiscan myovue to r/o CAD.  Continue tikosyn and anticoagulation.  F/U flex PA after myovue

## 2014-09-17 NOTE — Telephone Encounter (Signed)
New message      Patient needs to speak to nurse about B/P.  Please give patient a call.

## 2014-09-18 NOTE — Telephone Encounter (Signed)
PT  NOTIFIED ./CY 

## 2014-09-24 ENCOUNTER — Encounter: Payer: Self-pay | Admitting: Cardiovascular Disease

## 2014-09-26 ENCOUNTER — Ambulatory Visit (HOSPITAL_COMMUNITY): Payer: Medicaid Other | Attending: Cardiology | Admitting: Radiology

## 2014-09-26 VITALS — BP 125/73 | HR 60 | Ht 66.0 in | Wt 187.0 lb

## 2014-09-26 DIAGNOSIS — I480 Paroxysmal atrial fibrillation: Secondary | ICD-10-CM

## 2014-09-26 DIAGNOSIS — I959 Hypotension, unspecified: Secondary | ICD-10-CM | POA: Diagnosis not present

## 2014-09-26 DIAGNOSIS — R110 Nausea: Secondary | ICD-10-CM | POA: Diagnosis not present

## 2014-09-26 DIAGNOSIS — R55 Syncope and collapse: Secondary | ICD-10-CM

## 2014-09-26 MED ORDER — AMINOPHYLLINE 25 MG/ML IV SOLN
75.0000 mg | Freq: Once | INTRAVENOUS | Status: AC
  Administered 2014-09-26: 75 mg via INTRAVENOUS

## 2014-09-26 MED ORDER — REGADENOSON 0.4 MG/5ML IV SOLN
0.4000 mg | Freq: Once | INTRAVENOUS | Status: AC
  Administered 2014-09-26: 0.4 mg via INTRAVENOUS

## 2014-09-26 MED ORDER — TECHNETIUM TC 99M SESTAMIBI GENERIC - CARDIOLITE
30.0000 | Freq: Once | INTRAVENOUS | Status: AC | PRN
  Administered 2014-09-26: 30 via INTRAVENOUS

## 2014-09-26 MED ORDER — TECHNETIUM TC 99M SESTAMIBI GENERIC - CARDIOLITE
10.0000 | Freq: Once | INTRAVENOUS | Status: AC | PRN
  Administered 2014-09-26: 10 via INTRAVENOUS

## 2014-09-26 NOTE — Progress Notes (Signed)
[facility] SITE 3 NUCLEAR MED [address] [place], [place] [id number] [phone]    Cardiology Nuclear Med Study  Toni RosenthalLinda F [name] is a 64 y.o. female     MRN : [id number]     DOB: 1950/10/07  Procedure Date: 09/26/2014  Nuclear Med Background Indication for Stress Test:  Evaluation for Ischemia, Post Hospital- Chest Pain, Syncope, Atrial Fibbrillation History:  History of seizures on Keppra Cardiac Risk Factors: Hypertension and IDDM Type 2  Symptoms:  Syncope   Nuclear Pre-Procedure Caffeine/Decaff Intake:  None NPO After: 9:00pm   Lungs:  clear O2 Sat: 97% on room air. IV 0.9% NS with Angio Cath:  22g  IV Site: R Hand  IV Started by:  Bonnita LevanJackie Smith, RN  Chest Size (in):  40 Cup Size: D  Height: 5\' 6"  (1.676 m)  Weight:  187 lb (84.823 kg)  BMI:  Body mass index is 30.2 kg/(m^2). Tech Comments:  CBG@ 7a=127    Nuclear Med Study 1 or 2 day study: 1 day  Stress Test Type:  Lexiscan  Reading MD: N/A  Order Authorizing Provider:  Charlton HawsPeter Nishan, MD  Resting Radionuclide: Technetium 1539m Sestamibi  Resting Radionuclide Dose: 11.0 mCi   Stress Radionuclide:  Technetium 1839m Sestamibi  Stress Radionuclide Dose: 33.0 mCi           Stress Protocol Rest HR: 60 Stress HR: 87  Rest BP: 125/73 Stress BP: 111/53  Exercise Time (min): n/a METS: n/a   Predicted Max HR: 158 bpm % Max HR: 55.06 bpm Rate Pressure Product: [id number]   Dose of Adenosine (mg):  n/a Dose of Lexiscan: 0.4 mg  Dose of Atropine (mg): n/a Dose of Dobutamine: n/a mcg/kg/min (at max HR)  Stress Test Technologist: Joslynne Klatt ChimesSharon Brooks, BS-ES  Nuclear Technologist:  Kerby NoraElzbieta Kubak, CNMT     Rest Procedure:  Myocardial perfusion imaging was performed at rest 45 minutes following the intravenous administration of Technetium 3439m Sestamibi. Rest ECG: NSR - Normal EKG  Stress Procedure:  The patient received IV Lexiscan 0.4 mg over 15-seconds.  Technetium 3539m Sestamibi injected at 30-seconds.  Quantitative spect  images were obtained after a 45 minute delay.  During the infusion of Lexiscan the patient complained of SOB, lightheadedness, chest pressure and stomach upset.  Administered 75 mg aminophylline and the patient slowly began to feel better.  Stress ECG: No significant change from baseline ECG  QPS Raw Data Images:  Normal; no motion artifact; normal heart/lung ratio. Stress Images:  Normal homogeneous uptake in all areas of the myocardium. Rest Images:  Normal homogeneous uptake in all areas of the myocardium. Subtraction (SDS):  No evidence of ischemia. Transient Ischemic Dilatation (Normal <1.22):  0.92 Lung/Heart Ratio (Normal <0.45):  0.23  Quantitative Gated Spect Images QGS EDV:  86 ml QGS ESV:  30 ml  Impression Exercise Capacity:  Lexiscan with no exercise. BP Response:  Normal blood pressure response. Clinical Symptoms:  Mild chest pain/dyspnea. ECG Impression:  No significant ST segment change suggestive of ischemia. Comparison with Prior Nuclear Study: No images to compare  Overall Impression:  Normal stress nuclear study.  LV Ejection Fraction: 65%.  LV Wall Motion:  NL LV Function; NL Wall Motion  Toni MassonELSON, [name] 09/26/2014'

## 2014-10-05 ENCOUNTER — Other Ambulatory Visit: Payer: Self-pay | Admitting: *Deleted

## 2014-10-05 ENCOUNTER — Telehealth: Payer: Self-pay | Admitting: Cardiovascular Disease

## 2014-10-05 ENCOUNTER — Ambulatory Visit (INDEPENDENT_AMBULATORY_CARE_PROVIDER_SITE_OTHER): Payer: Medicaid Other | Admitting: Cardiovascular Disease

## 2014-10-05 ENCOUNTER — Encounter: Payer: Self-pay | Admitting: Cardiovascular Disease

## 2014-10-05 VITALS — BP 120/62 | HR 72 | Ht 66.0 in | Wt 191.4 lb

## 2014-10-05 DIAGNOSIS — I480 Paroxysmal atrial fibrillation: Secondary | ICD-10-CM

## 2014-10-05 DIAGNOSIS — I10 Essential (primary) hypertension: Secondary | ICD-10-CM

## 2014-10-05 DIAGNOSIS — E108 Type 1 diabetes mellitus with unspecified complications: Secondary | ICD-10-CM

## 2014-10-05 NOTE — Assessment & Plan Note (Signed)
Well controlled.  Continue current medications and low sodium Dash type diet.   Tolerate some elevation in systolic BP due to diabetic autonomic dysfunction and  Some orthostasis

## 2014-10-05 NOTE — Patient Instructions (Addendum)
Your physician wants you to follow-up in:   6 MONTHS WITH DR  Haywood FillerNISHAN You will receive a reminder letter in the mail two months in advance. If you don't receive a letter, please call our office to schedule the follow-up appointment. Your physician recommends that you continue on your current medications as directed. Please refer to the Current Medication list given to you today. You have been referred to [facility]   AND  DR  Ernest HaberHRISTINA  GHERGHE

## 2014-10-05 NOTE — Progress Notes (Signed)
Patient ID: Myles RosenthalLinda F Hammill, female   DOB: January 31, 1951, 64 y.o.   MRN: [id number] 64 y.o. with  HTN and PAF On tikosyn. Recent hospitalization 5/15  with PAF converted with cardizem Xarelto started. Normal cath in 2012  Echo 5/24 normal EF and only mild LAE  Study Conclusions  - Left ventricle: The cavity size was normal. Wall thickness was increased in a pattern of mild LVH. Systolic function was normal. The estimated ejection fraction was in the range of 60% to 65%. Wall motion was normal; there were no regional wall motion abnormalities. Doppler parameters are consistent with abnormal left ventricular relaxation (grade 1 diastolic dysfunction). - Left atrium: The atrium was mildly dilated.  Doing well with no recurrent palpitations   Seizure disorder followed by neurology Last one also in 5/15 Keppra increased at that time  Discussed need to stay on xarelto with recurrent afib and [place] VASC score of 3    [facility] UTI and orthostasis Meds adjusted Rx with Ceftin  Less postural symptoms No PAF recurrence  Need new primary as Dr Parke SimmersBland not working Out and needs to see endocrinologist for DM  No off glucophage and byetta  ROS: Denies fever, malais, weight loss, blurry vision, decreased visual acuity, cough, sputum, SOB, hemoptysis, pleuritic pain, palpitaitons, heartburn, abdominal pain, melena, lower extremity edema, claudication, or rash.  All other systems reviewed and negative  General: Affect appropriate Black female ambulating with walker  HEENT: normal Neck supple with no adenopathy JVP normal no bruits no thyromegaly Lungs clear with no wheezing and good diaphragmatic motion Heart:  S1/S2 no murmur, no rub, gallop or click PMI normal Abdomen: benighn, BS positve, no tenderness, no AAA no bruit.  No HSM or HJR Distal pulses intact with no bruits No edema Neuro non-focal Skin warm and dry No muscular weakness   Current Outpatient Prescriptions  Medication Sig  Dispense Refill  . albuterol (PROVENTIL HFA;VENTOLIN HFA) 108 (90 BASE) MCG/ACT inhaler Inhale 2 puffs into the lungs every 4 (four) hours as needed for wheezing or shortness of breath.    Marland Kitchen. atorvastatin (LIPITOR) 10 MG tablet Take 10 mg by mouth daily.    . calcium carbonate (OS-CAL) 600 MG TABS Take 1 tablet (600 mg total) by mouth daily.    . cholecalciferol (VITAMIN D) 1000 UNITS tablet Take 1,000 Units by mouth 2 (two) times daily.     . Cranberry 500 MG CAPS Take 500 mg by mouth 2 (two) times daily.    Marland Kitchen. docusate sodium (COLACE) 100 MG capsule Take 100 mg by mouth 2 (two) times daily as needed for constipation. For stool softner    . dofetilide ([name]) 250 MCG capsule Take 1 capsule (250 mcg total) by mouth 2 (two) times daily. 180 capsule 4  . DULERA 200-5 MCG/ACT AERO Inhale 2 puffs into the lungs 2 (two) times daily.  0  . DULoxetine (CYMBALTA) 60 MG capsule Take 60 mg by mouth daily.    . ferrous sulfate 325 (65 FE) MG tablet Take 325 mg by mouth daily with breakfast.    . Insulin Glargine (LANTUS SOLOSTAR) 100 UNIT/ML Solostar Pen Inject 25 Units into the skin at bedtime. 15 mL 3  . levETIRAcetam (KEPPRA XR) 500 MG 24 hr tablet Take 4 tablets (2,000 mg total) by mouth at bedtime. 120 tablet 12  . loratadine (CLARITIN) 10 MG tablet Take 10 mg by mouth every morning.    Marland Kitchen. NEXIUM 40 MG capsule take 1 capsule by  mouth twice a day WITH MEALS 60 capsule 2  . niacin (NIASPAN) 500 MG CR tablet Take 500 mg by mouth at bedtime.     . nitroGLYCERIN (NITROSTAT) 0.4 MG SL tablet Place 0.4 mg under the tongue every 5 (five) minutes as needed for chest pain.    Marland Kitchen ondansetron (ZOFRAN-ODT) 8 MG disintegrating tablet Take 8 mg by mouth every 6 (six) hours as needed for nausea.    Marland Kitchen oxyCODONE-acetaminophen (PERCOCET) 10-325 MG per tablet Take 1 tablet by mouth every 8 (eight) hours as needed for pain. For pain    . potassium chloride (K-DUR) 10 MEQ tablet Take 1 tablet (10 mEq total) by mouth daily. 90  tablet 1  . pregabalin (LYRICA) 100 MG capsule Take 100 mg by mouth 3 (three) times daily.    . rivaroxaban (XARELTO) 20 MG TABS tablet Take 1 tablet (20 mg total) by mouth daily with supper. 30 tablet 0  . vitamin C (ASCORBIC ACID) 500 MG tablet Take 500 mg by mouth 2 (two) times daily.    . VOLTAREN 1 % GEL Apply 2 g topically 4 (four) times daily as needed. For pain  0   No current facility-administered medications for this visit.    Allergies  Amitriptyline; Hydromorphone hcl; and Prednisone  Electrocardiogram:  5/15 SR rate 61 inferolateral T wave changes  QT 444   Assessment and Plan

## 2014-10-05 NOTE — Assessment & Plan Note (Signed)
Maint NSR continue tikosyn  K / Cr ok  On eliquis  Stable

## 2014-10-05 NOTE — Addendum Note (Signed)
Addended by: Scherrie BatemanYORK, CHRISTINE E on: 10/05/2014 01:02 PM   Modules accepted: Orders

## 2014-10-05 NOTE — Assessment & Plan Note (Signed)
Refer to Dr Dan EuropeGherege  Primary Rx Lantus insulin now

## 2014-10-05 NOTE — Telephone Encounter (Signed)
New message      Pt was seen today and is being [facility].  She want to know if she need to call and make an appt, or will we call

## 2014-10-05 NOTE — Telephone Encounter (Signed)
Per  Stephine @ Dr. Elvera LennoxGherghe office they don't accept Medicaid.

## 2014-10-05 NOTE — Telephone Encounter (Signed)
Calling wanting to know if we made the appointment with [facility].  Spoke w/[name] in check out who states she has [name] on her medicaid access card and as long as she has [name] on card we can not make referral to another PCP.  She states she has spoken with her case worker and will get [name]'s name removed.  Advised once that has occurred she can call us back and can make referral then.  The endocrinologist referral has been sent and their office will call her with date and time. She verbalizes understanding and will call back when her card has been cleared.

## 2014-10-09 NOTE — Telephone Encounter (Signed)
See if [name] or [name] will see her

## 2014-10-09 NOTE — Telephone Encounter (Signed)
WILL LET  DR Eden EmmsNISHAN  KNOW .Zack Seal/CY

## 2014-10-10 NOTE — Telephone Encounter (Signed)
WILL FORWARD  TO  SCHEDULERS  TO  ATTEMPT  AN  APPT   WITH ONE  OF  THE OTHER MDS  AS  REQUESTED PER  DR NISHAN./CY

## 2014-10-16 ENCOUNTER — Other Ambulatory Visit: Payer: Self-pay | Admitting: *Deleted

## 2014-10-16 DIAGNOSIS — IMO0002 Reserved for concepts with insufficient information to code with codable children: Secondary | ICD-10-CM

## 2014-10-16 DIAGNOSIS — E1165 Type 2 diabetes mellitus with hyperglycemia: Secondary | ICD-10-CM

## 2014-10-18 ENCOUNTER — Other Ambulatory Visit: Payer: Self-pay | Admitting: Gastroenterology

## 2014-10-23 ENCOUNTER — Telehealth: Payer: Self-pay | Admitting: Cardiovascular Disease

## 2014-10-23 NOTE — Telephone Encounter (Signed)
New message     Pt c/o BP issue: STAT if pt c/o blurred vision, one-sided weakness or slurred speech  1. What are your last 5 BP readings? 129/59 HR 90, 86/59 HR 113   2. Are you having any other symptoms (ex. Dizziness, headache, blurred vision, passed out)? Pain in lower back shooting to upper back and pt does not feel good 3. What is your BP issue?  bp dropped---- Pt went to see kidney doctor today but did not get to see him.

## 2014-10-23 NOTE — Telephone Encounter (Signed)
Patient complaining of lower back pain that is sharp and 8 out of 10 on pain scale. Patient stated she has a history of kidney problems and tried to see her nephrologist today, but could not get in today. Informed patient if she is in this much pain that she need to either go to urgent care or the ED. Asked patient to have someone to drive her because she has taken lyrica and Percocet for pain recently.  Patient verbalized understanding and agreed to plan.

## 2014-10-25 ENCOUNTER — Emergency Department (HOSPITAL_COMMUNITY)
Admission: EM | Admit: 2014-10-25 | Discharge: 2014-10-26 | Disposition: A | Discharge status: Home / Self Care | Payer: Medicaid Other | Attending: Emergency Medicine | Admitting: Emergency Medicine

## 2014-10-25 ENCOUNTER — Encounter (HOSPITAL_COMMUNITY): Payer: Self-pay | Admitting: *Deleted

## 2014-10-25 ENCOUNTER — Emergency Department (HOSPITAL_COMMUNITY): Payer: Medicaid Other

## 2014-10-25 DIAGNOSIS — R109 Unspecified abdominal pain: Secondary | ICD-10-CM | POA: Diagnosis not present

## 2014-10-25 DIAGNOSIS — I10 Essential (primary) hypertension: Secondary | ICD-10-CM | POA: Insufficient documentation

## 2014-10-25 DIAGNOSIS — Z7901 Long term (current) use of anticoagulants: Secondary | ICD-10-CM | POA: Diagnosis not present

## 2014-10-25 DIAGNOSIS — G8929 Other chronic pain: Secondary | ICD-10-CM | POA: Diagnosis not present

## 2014-10-25 DIAGNOSIS — Z86010 Personal history of colonic polyps: Secondary | ICD-10-CM | POA: Insufficient documentation

## 2014-10-25 DIAGNOSIS — Z794 Long term (current) use of insulin: Secondary | ICD-10-CM | POA: Diagnosis not present

## 2014-10-25 DIAGNOSIS — Z9889 Other specified postprocedural states: Secondary | ICD-10-CM | POA: Insufficient documentation

## 2014-10-25 DIAGNOSIS — E785 Hyperlipidemia, unspecified: Secondary | ICD-10-CM | POA: Insufficient documentation

## 2014-10-25 DIAGNOSIS — Z8659 Personal history of other mental and behavioral disorders: Secondary | ICD-10-CM | POA: Diagnosis not present

## 2014-10-25 DIAGNOSIS — Z79899 Other long term (current) drug therapy: Secondary | ICD-10-CM | POA: Insufficient documentation

## 2014-10-25 DIAGNOSIS — J45909 Unspecified asthma, uncomplicated: Secondary | ICD-10-CM | POA: Insufficient documentation

## 2014-10-25 DIAGNOSIS — E119 Type 2 diabetes mellitus without complications: Secondary | ICD-10-CM | POA: Diagnosis not present

## 2014-10-25 DIAGNOSIS — M549 Dorsalgia, unspecified: Secondary | ICD-10-CM | POA: Diagnosis not present

## 2014-10-25 DIAGNOSIS — Z87442 Personal history of urinary calculi: Secondary | ICD-10-CM | POA: Diagnosis not present

## 2014-10-25 DIAGNOSIS — Z8719 Personal history of other diseases of the digestive system: Secondary | ICD-10-CM | POA: Insufficient documentation

## 2014-10-25 DIAGNOSIS — M79605 Pain in left leg: Secondary | ICD-10-CM | POA: Insufficient documentation

## 2014-10-25 DIAGNOSIS — Z862 Personal history of diseases of the blood and blood-forming organs and certain disorders involving the immune mechanism: Secondary | ICD-10-CM | POA: Insufficient documentation

## 2014-10-25 DIAGNOSIS — M79604 Pain in right leg: Secondary | ICD-10-CM | POA: Insufficient documentation

## 2014-10-25 LAB — LIPASE, BLOOD: Lipase: 65 U/L — ABNORMAL HIGH (ref 11–59)

## 2014-10-25 LAB — COMPREHENSIVE METABOLIC PANEL
ALK PHOS: 85 U/L (ref 39–117)
ALT: 17 U/L (ref 0–35)
ANION GAP: 10 (ref 5–15)
AST: 24 U/L (ref 0–37)
Albumin: 4.1 g/dL (ref 3.5–5.2)
BUN: 13 mg/dL (ref 6–23)
CHLORIDE: 104 mmol/L (ref 96–112)
CO2: 24 mmol/L (ref 19–32)
Calcium: 9.2 mg/dL (ref 8.4–10.5)
Creatinine, Ser: 1.44 mg/dL — ABNORMAL HIGH (ref 0.50–1.10)
GFR calc Af Amer: 44 mL/min — ABNORMAL LOW (ref >90)
GFR calc non Af Amer: 38 mL/min — ABNORMAL LOW (ref >90)
Glucose, Bld: 138 mg/dL — ABNORMAL HIGH (ref 70–99)
POTASSIUM: 3.9 mmol/L (ref 3.5–5.1)
Sodium: 138 mmol/L (ref 135–145)
Total Bilirubin: 0.4 mg/dL (ref 0.3–1.2)
Total Protein: 7.4 g/dL (ref 6.0–8.3)

## 2014-10-25 LAB — CBC WITH DIFFERENTIAL/PLATELET
BASOS ABS: 0 10*3/uL (ref 0.0–0.1)
BASOS PCT: 0 % (ref 0–1)
Eosinophils Absolute: 0 10*3/uL (ref 0.0–0.7)
Eosinophils Relative: 1 % (ref 0–5)
HCT: 36.7 % (ref 36.0–46.0)
HEMOGLOBIN: 12.3 g/dL (ref 12.0–15.0)
Lymphocytes Relative: 45 % (ref 12–46)
Lymphs Abs: 1.5 10*3/uL (ref 0.7–4.0)
MCH: 29 pg (ref 26.0–34.0)
MCHC: 33.5 g/dL (ref 30.0–36.0)
MCV: 86.6 fL (ref 78.0–100.0)
MONOS PCT: 7 % (ref 3–12)
Monocytes Absolute: 0.2 10*3/uL (ref 0.1–1.0)
Neutro Abs: 1.6 10*3/uL — ABNORMAL LOW (ref 1.7–7.7)
Neutrophils Relative %: 47 % (ref 43–77)
Platelets: 188 10*3/uL (ref 150–400)
RBC: 4.24 MIL/uL (ref 3.87–5.11)
RDW: 13.2 % (ref 11.5–15.5)
WBC: 3.4 10*3/uL — AB (ref 4.0–10.5)

## 2014-10-25 LAB — URINALYSIS, ROUTINE W REFLEX MICROSCOPIC
BILIRUBIN URINE: NEGATIVE
Glucose, UA: NEGATIVE mg/dL
HGB URINE DIPSTICK: NEGATIVE
Ketones, ur: NEGATIVE mg/dL
Nitrite: NEGATIVE
Protein, ur: NEGATIVE mg/dL
SPECIFIC GRAVITY, URINE: 1.021 (ref 1.005–1.030)
Urobilinogen, UA: 0.2 mg/dL (ref 0.0–1.0)
pH: 5 (ref 5.0–8.0)

## 2014-10-25 LAB — URINE MICROSCOPIC-ADD ON

## 2014-10-25 LAB — I-STAT CG4 LACTIC ACID, ED: LACTIC ACID, VENOUS: 1.83 mmol/L (ref 0.5–2.0)

## 2014-10-25 LAB — LACTIC ACID, PLASMA: Lactic Acid, Venous: 1.9 mmol/L (ref 0.5–2.0)

## 2014-10-25 MED ORDER — SODIUM CHLORIDE 0.9 % IV BOLUS (SEPSIS)
1000.0000 mL | Freq: Once | INTRAVENOUS | Status: AC
  Administered 2014-10-25: 1000 mL via INTRAVENOUS

## 2014-10-25 MED ORDER — ONDANSETRON HCL 4 MG/2ML IJ SOLN
4.0000 mg | Freq: Once | INTRAMUSCULAR | Status: AC
  Administered 2014-10-25: 4 mg via INTRAVENOUS
  Filled 2014-10-25: qty 2

## 2014-10-25 MED ORDER — SODIUM CHLORIDE 0.9 % IV BOLUS (SEPSIS): 1000.0000 mL | Freq: Once | INTRAVENOUS | Status: DC

## 2014-10-25 MED ORDER — FENTANYL CITRATE 0.05 MG/ML IJ SOLN
INTRAMUSCULAR | Status: DC
Start: 2014-10-25 — End: 2014-10-26
  Filled 2014-10-25: qty 2

## 2014-10-25 MED ORDER — FENTANYL CITRATE 0.05 MG/ML IJ SOLN
50.0000 ug | Freq: Once | INTRAMUSCULAR | Status: AC
  Administered 2014-10-25: 50 ug via INTRAVENOUS

## 2014-10-25 MED ORDER — FENTANYL CITRATE 0.05 MG/ML IJ SOLN
50.0000 ug | Freq: Once | INTRAMUSCULAR | Status: AC
  Administered 2014-10-25: 50 ug via INTRAVENOUS
  Filled 2014-10-25: qty 2

## 2014-10-25 NOTE — ED Provider Notes (Signed)
CSN: [id number]     Arrival date & time 10/25/14  1729 History   First MD Initiated Contact with Patient 10/25/14 1833     Chief Complaint  Patient presents with  . Leg Pain  . Flank Pain     (Consider location/radiation/quality/duration/timing/severity/associated sxs/prior Treatment) HPI  64 y/o female w/ PMH afib (on xarelto), HTN, HL, IBS, DM, previous kidney stones who P/w abdominal pain/back pain for the past couple of days.  Says the pain feels similar to previous kidney stones.  No urinary incontinence, frequency but some "pressure" w/ urination.  The pain has been constant, describes as sharp.  No fevers, the pain radiates down the leg.  No nausea/vomiting/diarrhea/constipation.  While in triage, the patient had a brief syncopal episode were she started feeling hot, lightheaded, possibly had a couple of tonic clonic movements and then quickly came back to baseline w/o postictal period.  Past Medical History  Diagnosis Date  . Essential hypertension   . Hyperlipidemia   . Colon polyps   . Diverticulosis   . Gallstones   . IBS (irritable bowel syndrome)   . Kidney stones   . Paroxysmal a-fib     Coumadin (Chads2=2) Echo 09/05/10 EF 55-60%; mod LVH  . Depression   . Asthma     Dr. [name]  . Sarcoidosis of lung     Dr. [name]  . Fibromyalgia   . Peripheral neuropathy   . Type II diabetes mellitus   . H/O hiatal hernia   . GERD (gastroesophageal reflux disease)   . Migraine   . Epilepsy   . Arthritis   . Chronic back pain   . Anxiety   . History of cardiac catheterization     Normal coronaries 2012   Past Surgical History  Procedure Laterality Date  . Closed reduction ankle fracture Left 10/2006  . Resection of hand neuroma Left 01/2002  . Neuroplasty / transposition median nerve at carpal tunnel Left 2004  . Esophageal manometry  03/21/2012    Procedure: ESOPHAGEAL MANOMETRY (EM);  Surgeon: [name], MD;  Location: [facility];  Service: Endoscopy;  Laterality:  N/A;  . Colon surgery    . Anterior cervical decomp/discectomy fusion  07/11/2012    Procedure: ANTERIOR CERVICAL DECOMPRESSION/DISCECTOMY FUSION 1 LEVEL;  Surgeon: [name], MD;  Location: [facility];  Service: Neurosurgery;  Laterality: N/A;  Cervical Five-Six Anterior Cervical Decompression with Fusion Interbody Prothesis Plating and Bonegraft  . Cholecystectomy  1976  . Neuroplasty / transposition median nerve at carpal tunnel Right 2002  . Abdominal hysterectomy  1995  . Salpingoophorectomy Bilateral 2000  . Dilation and curettage of uterus    . Tubal ligation  1982  . Fracture surgery    . Cardiac catheterization  2012   Family History  Problem Relation Age of Onset  . Heart attack Mother     @ age 9  . Mental illness Mother   . Diabetes Mother   . Hypertension Mother     siblings  . Heart attack Brother 50  . Colon cancer Maternal Aunt   . Prostate cancer Maternal Grandfather   . Ovarian cancer Maternal Aunt   . Diabetes    . Lupus Sister   . Ovarian cancer Cousin   . Hypertension     History  Substance Use Topics  . Smoking status: Never Smoker   . Smokeless tobacco: Never Used  . Alcohol Use: No   OB History    No data available  Review of Systems  Constitutional: Negative for fever and chills.  HENT: Negative for nosebleeds.   Eyes: Negative for visual disturbance.  Respiratory: Negative for cough and shortness of breath.   Cardiovascular: Negative for chest pain.  Gastrointestinal: Positive for abdominal pain. Negative for nausea, vomiting, diarrhea and constipation.  Genitourinary: Negative for dysuria.  Musculoskeletal: Positive for back pain.  Skin: Negative for rash.  Neurological: Negative for weakness.      Allergies  Amitriptyline; Hydromorphone hcl; and Prednisone  Home Medications   Prior to Admission medications   Medication Sig Start Date End Date Taking? Authorizing Provider  albuterol (PROVENTIL HFA;VENTOLIN HFA) 108  (90 BASE) MCG/ACT inhaler Inhale 2 puffs into the lungs every 4 (four) hours as needed for wheezing or shortness of breath. 10/19/11   Tammy S Parrett, NP  atorvastatin (LIPITOR) 10 MG tablet Take 10 mg by mouth daily.    Historical Provider, MD  calcium carbonate (OS-CAL) 600 MG TABS Take 1 tablet (600 mg total) by mouth daily. 10/19/11   Tammy S Parrett, NP  cholecalciferol (VITAMIN D) 1000 UNITS tablet Take 1,000 Units by mouth 2 (two) times daily.     Historical Provider, MD  Cranberry 500 MG CAPS Take 500 mg by mouth 2 (two) times daily.    Historical Provider, MD  docusate sodium (COLACE) 100 MG capsule Take 100 mg by mouth 2 (two) times daily as needed for constipation. For stool softner    Historical Provider, MD  dofetilide ([name]) 250 MCG capsule Take 1 capsule (250 mcg total) by mouth 2 (two) times daily. 03/07/14   Wendall StadePeter C Nishan, MD  DULERA 200-5 MCG/ACT AERO Inhale 2 puffs into the lungs 2 (two) times daily. 06/07/14   Historical Provider, MD  DULoxetine (CYMBALTA) 60 MG capsule Take 60 mg by mouth daily.    Historical Provider, MD  ferrous sulfate 325 (65 FE) MG tablet Take 325 mg by mouth daily with breakfast.    Historical Provider, MD  Insulin Glargine (LANTUS SOLOSTAR) 100 UNIT/ML Solostar Pen Inject 25 Units into the skin at bedtime. 09/11/14   Ripudeep Jenna LuoK Rai, MD  levETIRAcetam (KEPPRA XR) 500 MG 24 hr tablet Take 4 tablets (2,000 mg total) by mouth at bedtime. 04/30/14   Suanne MarkerVikram R Penumalli, MD  loratadine (CLARITIN) 10 MG tablet Take 10 mg by mouth every morning.    Historical Provider, MD  NEXIUM 40 MG capsule take 1 capsule by mouth twice a day with meals 10/18/14   Rachael Feeaniel P Jacobs, MD  niacin (NIASPAN) 500 MG CR tablet Take 500 mg by mouth at bedtime.     Historical Provider, MD  nitroGLYCERIN (NITROSTAT) 0.4 MG SL tablet Place 0.4 mg under the tongue every 5 (five) minutes as needed for chest pain.    Historical Provider, MD  ondansetron (ZOFRAN-ODT) 8 MG disintegrating tablet Take  8 mg by mouth every 6 (six) hours as needed for nausea.    Historical Provider, MD  oxyCODONE-acetaminophen (PERCOCET) 10-325 MG per tablet Take 1 tablet by mouth every 8 (eight) hours as needed for pain. For pain 08/04/14   Vassie Lollarlos Madera, MD  potassium chloride (K-DUR) 10 MEQ tablet Take 1 tablet (10 mEq total) by mouth daily. 06/17/14   Wendall StadePeter C Nishan, MD  pregabalin (LYRICA) 100 MG capsule Take 100 mg by mouth 3 (three) times daily.    Historical Provider, MD  rivaroxaban (XARELTO) 20 MG TABS tablet Take 1 tablet (20 mg total) by mouth daily with supper. 01/08/14   Samara DeistKathryn  Dayton Scrape, NP  vitamin C (ASCORBIC ACID) 500 MG tablet Take 500 mg by mouth 2 (two) times daily.    Historical Provider, MD  VOLTAREN 1 % GEL Apply 2 g topically 4 (four) times daily as needed. For pain 08/13/14   Historical Provider, MD   BP 120/62 mmHg  Pulse 67  Temp(Src) 97.7 F (36.5 C) (Oral)  Resp 18  Ht 5\' 6"  (1.676 m)  Wt 195 lb (88.451 kg)  BMI 31.49 kg/m2  SpO2 97% Physical Exam  Constitutional: She is oriented to person, place, and time. No distress.  HENT:  Head: Normocephalic and atraumatic.  Eyes: EOM are normal. Pupils are equal, round, and reactive to light.  Neck: Normal range of motion. Neck supple.  Cardiovascular: Normal rate and intact distal pulses.   Pulmonary/Chest: No respiratory distress.  Abdominal: Soft. There is tenderness. There is no rebound and no guarding.  Musculoskeletal:  Normal strength and sensation in the bilat LE  Neurological: She is alert and oriented to person, place, and time.  Skin: No rash noted. She is not diaphoretic.    ED Course  Procedures (including critical care time) Labs Review Labs Reviewed  CBC WITH DIFFERENTIAL/PLATELET - Abnormal; Notable for the following:    WBC 3.4 (*)    Neutro Abs 1.6 (*)    All other components within normal limits  COMPREHENSIVE METABOLIC PANEL - Abnormal; Notable for the following:    Glucose, Bld 138 (*)    Creatinine,  Ser 1.44 (*)    GFR calc non Af Amer 38 (*)    GFR calc Af Amer 44 (*)    All other components within normal limits  LIPASE, BLOOD - Abnormal; Notable for the following:    Lipase 65 (*)    All other components within normal limits  URINALYSIS, ROUTINE W REFLEX MICROSCOPIC  LACTIC ACID, PLASMA  LACTIC ACID, PLASMA  I-STAT CG4 LACTIC ACID, ED    Imaging Review Ct Renal Stone Study  10/25/2014   CLINICAL DATA:  Right flank pain and right lower quadrant abdominal pain since this past weekend. Nausea. Patient also passed out a little while ago.  EXAM: CT ABDOMEN AND PELVIS WITHOUT CONTRAST  TECHNIQUE: Multidetector CT imaging of the abdomen and pelvis was performed following the standard protocol without IV contrast.  COMPARISON:  09/06/2014  FINDINGS: Atelectasis in the lung bases. The kidneys appear symmetrical in size and shape. No hydronephrosis or hydroureter. No renal or ureteral stone or obstruction. Calcified phleboliths in the gonadal veins.  The unenhanced appearance of the liver, spleen, gallbladder, pancreas, adrenal glands, abdominal aorta, inferior vena cava, and retroperitoneal lymph nodes is unremarkable. Stomach and small bowel are mostly decompressed. Stool-filled colon without abnormal distention. No free air or free fluid in the abdomen. Abdominal wall musculature appears intact.  Pelvis: Appendix is normal. Bladder is decompressed. Uterus appears surgically absent. No pelvic mass or lymphadenopathy. No free or loculated pelvic fluid collections. No destructive bone lesions.  IMPRESSION: No renal or ureteral stone or obstruction. Appendix is normal. No bowel obstruction or acute inflammatory process demonstrated.   Electronically Signed   By: Burman Nieves M.D.   On: 10/25/2014 19:59     EKG Interpretation None      MDM   Final diagnoses:  Flank pain    64 y/o female w/ PMH afib (on xarelto), HTN, HL, IBS, DM, previous kidney stones who P/w abdominal pain/back pain for  the past couple of days.  She had a brief  syncopal episode in triage that was associated w/ prodromal sx and sounds vasovagal.  Exam as above, VSS.  Abdomen generally ttp but soft w/o rebound/guarding.  She has normal sensation and stregnth in bilat LE.  No urinary retention/fecal incontinence at home and no saddle anesthesia.  Doubt CE.  Will obtain labs, ekg, CT to eval for kidney stones or infection.  Will obtain UA  UA w/o infection, bmp w/ mild AoCKD.  Will give 1L NS.  Cbc/bmp unremarkable.  Lipase WNL.  CT w/o abnormality.  Patient is feeling somewhat better.  At this point feel she is safe for d/c.  Lactic acid WNL, doubt mesenteric ischemia.  I have discussed the results, Dx and Tx plan with the patient. They expressed understanding and agree with the plan and were told to return to ED with any worsening of condition or concern.    Disposition: Discharge  Condition: Good  New Prescriptions   No medications on file    Follow Up: [facility] EMERGENCY DEPARTMENT [address] [id number] mc [place] [address] [phone]  If symptoms worsen   Pt seen in conjunction with Dr. Janice Coffin, MD 10/26/14 0865  Azalia Bilis, MD 10/30/14 [phone]

## 2014-10-25 NOTE — ED Notes (Addendum)
Pt with R flank pain that radiates down her leg.  Pt states she experiences pressure when she urinates.  Pt also states she has a hx of kidney stones and this feels the same.  Also c/o nausea.

## 2014-10-25 NOTE — Discharge Instructions (Signed)

## 2014-10-25 NOTE — ED Notes (Signed)
Pt in triage and became seizing.  Responsive to sternal rub and not post-ictal (immediately ao x 4).  However, pt was diaphoretic and incontinent of urine.

## 2014-10-26 ENCOUNTER — Other Ambulatory Visit: Payer: Self-pay

## 2014-10-26 LAB — CBG MONITORING, ED: GLUCOSE-CAPILLARY: 133 mg/dL — AB (ref 70–99)

## 2014-10-26 MED ORDER — DOFETILIDE 250 MCG PO CAPS: 250.0000 ug | ORAL_CAPSULE | Freq: Two times a day (BID) | ORAL | Status: DC

## 2014-10-26 NOTE — ED Notes (Signed)
Patient discharged by prior nurse

## 2014-12-07 ENCOUNTER — Telehealth: Payer: Self-pay | Admitting: Cardiovascular Disease

## 2014-12-07 NOTE — Telephone Encounter (Signed)
Calling stating that she is seeing a therapist for mental health and he suggested that she might benefit from an antidepressant. States he wanted to know what med would be compatible with her heart medications. States  that someone in his group can order the medication but just needs to know what could be prescribed.  He is with [employer] [facility] and his name is Minerva Areolaric at [phone].  Advised Dr. Eden EmmsNishan not in office today but will forward message to him and his nurse Jilda PandaChristine York,LPN.

## 2014-12-07 NOTE — Telephone Encounter (Signed)
LM for Minerva AreolaEric at [employer] to call back

## 2014-12-07 NOTE — Telephone Encounter (Signed)
New Message Pt wanted tos peak w/ Rn about Dr. Eden EmmsNishan prescribing an antidepressant that would not conflict w/ current heart medication. Please call back and discuss.

## 2014-12-07 NOTE — Telephone Encounter (Signed)
Heart is ok for antidepressant but would not use Effexor due to afib. More concern is history of seizures on Keppra and psychiatrist should Talk to neurology/ or primary about what med to use that wouldn't lower Seizure thresshold

## 2014-12-10 NOTE — Telephone Encounter (Signed)
Lm for pt to call us back with another phone number for [name] in order to give him directions regarding antidepressant.

## 2014-12-11 NOTE — Telephone Encounter (Signed)
LEFT MESSAGE ON VOICE   MAIL OF DR Fabio BeringNISHAN[facility]  .Zack Seal/CY

## 2014-12-11 NOTE — Telephone Encounter (Signed)
Follow Up      Returning Anita's phone call.

## 2014-12-12 ENCOUNTER — Other Ambulatory Visit: Payer: Self-pay

## 2014-12-12 ENCOUNTER — Telehealth: Payer: Self-pay | Admitting: Cardiovascular Disease

## 2014-12-12 MED ORDER — NIACIN ER (ANTIHYPERLIPIDEMIC) 500 MG PO TBCR: 500.0000 mg | EXTENDED_RELEASE_TABLET | Freq: Every day | ORAL | Status: DC

## 2014-12-12 MED ORDER — DILTIAZEM HCL 30 MG PO TABS: 30.0000 mg | ORAL_TABLET | Freq: Two times a day (BID) | ORAL | Status: DC

## 2014-12-12 NOTE — Telephone Encounter (Signed)
New message     Wanted the nurse to know that he receive the message.     1. C/O patient having nightmare at night - can medication be look at to see if anything needs to be adjusted.   2. Referral  / recommendation to psychiatrics

## 2014-12-13 NOTE — Telephone Encounter (Signed)
LM TO CALL BACK ./CY 

## 2014-12-14 NOTE — Telephone Encounter (Signed)
Spoke w/Ms. Eshleman to let her know we have tried to contact Minerva AreolaEric several times and hasn't responded.  She states she just spoke w/him and told him to reschedule her appointment and told him she needed something for her nerves because her brother died.  States he told her he had about 7-8 clients to see today and may not be in the office.  She states she will try to call him again and let him know to call our office.

## 2014-12-14 NOTE — Telephone Encounter (Signed)
Eric from [facility] returning call.  Advised of Dr. Fabio BeringNishan's recommendations.  States he has [facility] so she can see a psychiatrist to prescribe an antidepressant. States pt is aware of referral.  Minerva Areolaric feels she is very depressed and needs to be on medication.

## 2014-12-14 NOTE — Telephone Encounter (Signed)
Follow up      Calling to give [name] an update on [name].  If possible, please call before 5

## 2014-12-14 NOTE — Telephone Encounter (Signed)
Spoke w/Lorraine at [facility] at [phone] to see if a message could be sent to [place] regarding the antidepressant.  She states she can call him and send a email for him to call us back.  States he is scheduled to be in office at 11:00.

## 2014-12-25 ENCOUNTER — Other Ambulatory Visit: Payer: Self-pay

## 2014-12-25 MED ORDER — POTASSIUM CHLORIDE ER 10 MEQ PO TBCR: 10.0000 meq | EXTENDED_RELEASE_TABLET | Freq: Every day | ORAL | Status: DC

## 2015-01-24 ENCOUNTER — Other Ambulatory Visit: Payer: Self-pay | Admitting: Adult Health

## 2015-01-25 NOTE — Telephone Encounter (Signed)
Per note 2.19.16  

## 2015-02-06 ENCOUNTER — Telehealth: Payer: Self-pay | Admitting: Cardiovascular Disease

## 2015-02-06 NOTE — Telephone Encounter (Signed)
Received records from [facility] forwarded to Dr. [name] 02/06/15 fbg.

## 2015-02-11 ENCOUNTER — Other Ambulatory Visit: Payer: Self-pay

## 2015-02-26 ENCOUNTER — Telehealth: Payer: Self-pay

## 2015-02-26 NOTE — Telephone Encounter (Signed)
Prior auth for [name] 250mcg called to [employer].

## 2015-02-26 NOTE — Telephone Encounter (Signed)
Prior auth called in to [employer] for [name] 250mcg caps. Will have answer in 24 hours.

## 2015-03-22 ENCOUNTER — Telehealth: Payer: Self-pay | Admitting: Cardiovascular Disease

## 2015-03-22 NOTE — Telephone Encounter (Signed)
New message      Pt states rehab is wanting to change her medications Please call to discuss

## 2015-03-22 NOTE — Telephone Encounter (Signed)
PT  CALLED  STATING MD  WANTED TO INCREASE  LATUDA  ? NOT  SHOWING  THIS  MED  ON LIST  WILL BACK   WITH  INFO  PT  NOT  SURE  OF MED SPELLING OR  DOSAGE   AWARE  DR [name] ON VACATION  NEXT  WEEK WILL CALL BACK FOLLOWING  WEEK  WITH INFO AND  WILL ADDRESS AT THAT  TIME .Zack Seal

## 2015-04-01 DIAGNOSIS — Z0271 Encounter for disability determination: Secondary | ICD-10-CM

## 2015-04-06 ENCOUNTER — Other Ambulatory Visit: Payer: Self-pay | Admitting: Gastroenterology

## 2015-04-06 NOTE — Progress Notes (Signed)
Patient ID: [name], female   DOB: 11/14/1950, 64 y.o.   MRN: [id number] 63 y.o. with  HTN and PAF On tikosyn. Hospitalization 5/15  with PAF converted with cardizem Xarelto started. Normal cath in 2012  Echo 5/24 normal EF and only mild LAE  Study Conclusions  - Left ventricle: The cavity size was normal. Wall thickness was increased in a pattern of mild LVH. Systolic function was normal. The estimated ejection fraction was in the range of 60% to 65%. Wall motion was normal; there were no regional wall motion abnormalities. Doppler parameters are consistent with abnormal left ventricular relaxation (grade 1 diastolic dysfunction). - Left atrium: The atrium was mildly dilated.  Doing well with no recurrent palpitations   Seizure disorder followed by neurology Last one also in 5/15 Keppra increased at that time  Discussed need to stay on xarelto with recurrent afib and [place] VASC score of 3    Seen at [facility] 12/15 with UTI and orthostasis Meds adjusted Rx with Ceftin  Less postural symptoms No PAF recurrence  Need new primary as Dr Parke Simmers not working Out and needs to see endocrinologist for DM  No off glucophage and byetta  She hates Jordan  Given for her depressive bipolar disease  ROS: Denies fever, malais, weight loss, blurry vision, decreased visual acuity, cough, sputum, SOB, hemoptysis, pleuritic pain, palpitaitons, heartburn, abdominal pain, melena, lower extremity edema, claudication, or rash.  All other systems reviewed and negative  General: Affect appropriate Black female ambulating with walker  HEENT: normal Neck supple with no adenopathy JVP normal no bruits no thyromegaly Lungs clear with no wheezing and good diaphragmatic motion Heart:  S1/S2 no murmur, no rub, gallop or click PMI normal Abdomen: benighn, BS positve, no tenderness, no AAA no bruit.  No HSM or HJR Distal pulses intact with no bruits No edema Neuro non-focal Skin warm and dry No muscular  weakness   Current Outpatient Prescriptions  Medication Sig Dispense Refill  . albuterol (PROVENTIL HFA;VENTOLIN HFA) 108 (90 BASE) MCG/ACT inhaler Inhale into the lungs daily as needed for wheezing or shortness of breath.    Marland Kitchen atorvastatin (LIPITOR) 40 MG tablet Take 40 mg by mouth daily.  0  . calcium carbonate (OS-CAL) 600 MG TABS Take 1 tablet (600 mg total) by mouth daily.    . cholecalciferol (VITAMIN D) 1000 UNITS tablet Take 1,000 Units by mouth 2 (two) times daily.     . Cranberry 500 MG CAPS Take 500 mg by mouth 2 (two) times daily.    Marland Kitchen docusate sodium (COLACE) 100 MG capsule Take 100 mg by mouth 2 (two) times daily as needed for constipation. For stool softner    . dofetilide ([name]) 250 MCG capsule Take 1 capsule (250 mcg total) by mouth 2 (two) times daily. 180 capsule 4  . doxazosin (CARDURA) 4 MG tablet Take 4 mg by mouth daily.    . DULERA 200-5 MCG/ACT AERO Inhale 2 puffs into the lungs 2 (two) times daily.  0  . DULoxetine (CYMBALTA) 60 MG capsule Take 60 mg by mouth daily.    . ferrous sulfate 325 (65 FE) MG tablet Take 325 mg by mouth daily with breakfast.    . Insulin Glargine (LANTUS SOLOSTAR) 100 UNIT/ML Solostar Pen Inject 25 Units into the skin at bedtime. 15 mL 3  . LATUDA 20 MG TABS Take 20 mg by mouth at bedtime.  0  . levETIRAcetam (KEPPRA XR) 500 MG 24 hr tablet Take 4 tablets (2,000  mg total) by mouth at bedtime. 120 tablet 12  . loratadine (CLARITIN) 10 MG tablet Take 10 mg by mouth every morning.    Marland Kitchen NEXIUM 40 MG capsule take 1 capsule by mouth twice a day with meals 60 capsule 2  . nitroGLYCERIN (NITROSTAT) 0.4 MG SL tablet Place 0.4 mg under the tongue every 5 (five) minutes as needed for chest pain (do nto exceed 3 doses).     . NOVOLOG FLEXPEN 100 UNIT/ML FlexPen Inject 4 Units as directed 2 (two) times daily.  0  . ondansetron (ZOFRAN-ODT) 8 MG disintegrating tablet Take 8 mg by mouth every 6 (six) hours as needed for nausea.    Marland Kitchen  oxyCODONE-acetaminophen (PERCOCET) 10-325 MG per tablet Take 1 tablet by mouth every 8 (eight) hours as needed for pain. For pain    . potassium chloride SA (K-DUR,KLOR-CON) 20 MEQ tablet Take 40 mEq by mouth daily.  0  . pregabalin (LYRICA) 100 MG capsule Take 100 mg by mouth 3 (three) times daily.    . vitamin C (ASCORBIC ACID) 500 MG tablet Take 500 mg by mouth 2 (two) times daily.    . VOLTAREN 1 % GEL Apply 2 g topically 4 (four) times daily as needed. For pain  0  . XARELTO 20 MG TABS tablet take 1 tablet by mouth once daily WITH SUPPER 30 tablet 6   No current facility-administered medications for this visit.    Allergies  Prednisone; Amitriptyline; and Hydromorphone hcl  Electrocardiogram:  5/15 SR rate 61 inferolateral T wave changes  QT 444   Assessment and Plan  PAF: maint NSR no palpitations continue xarelto and tikosyn DM:  Discussed low carb diet.  Target hemoglobin A1c is 6.5 or less.  Continue current medications. Seizures:  Quiescent continue Keppra Anticoagulation  On NOAC no bleeding issues Cr stable  Chol  Labs with primary Bipolar:  F/u behavioral health will need something besides latuda for depressive component as she  Is too drowsy           Recent labs with primary   F/U with me 6 months

## 2015-04-08 ENCOUNTER — Ambulatory Visit (INDEPENDENT_AMBULATORY_CARE_PROVIDER_SITE_OTHER): Payer: Medicaid Other | Admitting: Cardiovascular Disease

## 2015-04-08 ENCOUNTER — Encounter: Payer: Self-pay | Admitting: Cardiovascular Disease

## 2015-04-08 VITALS — BP 132/80 | HR 61 | Ht 66.0 in | Wt 201.0 lb

## 2015-04-08 DIAGNOSIS — I480 Paroxysmal atrial fibrillation: Secondary | ICD-10-CM | POA: Diagnosis not present

## 2015-04-08 NOTE — Patient Instructions (Signed)
Medication Instructions:  NO CHANGES  Labwork: NONE  Testing/Procedures: NONE  Follow-Up: Your physician wants you to follow-up in: 6 MONTHS WITH DR NISHAN You will receive a reminder letter in the mail two months in advance. If you don't receive a letter, please call our office to schedule the follow-up appointment.  Any Other Special Instructions Will Be Listed Below (If Applicable).   

## 2015-04-08 NOTE — Telephone Encounter (Signed)
DISCUSSED  AT TODAY 'S OFFICE  VISIT .[name]

## 2015-04-09 ENCOUNTER — Telehealth: Payer: Self-pay | Admitting: *Deleted

## 2015-04-09 NOTE — Telephone Encounter (Signed)
Patient calling, says she just received new patient paperwork on 8/19 that states she had an appointment scheduled on 8/17. Would like Annice Pih to call her to reschedule new patient appointment.

## 2015-04-12 ENCOUNTER — Telehealth: Payer: Self-pay | Admitting: Cardiovascular Disease

## 2015-04-12 DIAGNOSIS — R0683 Snoring: Secondary | ICD-10-CM

## 2015-04-12 NOTE — Telephone Encounter (Signed)
Order sleep study and f/u with Dr Mayford Knife

## 2015-04-12 NOTE — Telephone Encounter (Signed)
New Message        Pt calling wanting a sleep study ordered by Dr. [name]. Please call back and advise.

## 2015-04-12 NOTE — Telephone Encounter (Signed)
Calling stating that she wants Dr. [name] to order a sleep study for her.  She states she has someone staying with her and they are telling her that she is snoring loudly and seems to be "gasping for breath".  Advised will forward to Dr. [name] and his nurse Jilda Panda for recommendations. She verbalizes understanding.

## 2015-04-15 NOTE — Telephone Encounter (Signed)
Sleep Study has been scheduled. Packet of information has been sent to patient.  Spoke with patient to make her aware of what is going on.  She appreciated the call back.

## 2015-04-15 NOTE — Telephone Encounter (Signed)
MESSAGE FORWARDED TO  [facility] TO  SCHEDULE  SLEEP STUDY  .[name]

## 2015-04-15 NOTE — Telephone Encounter (Signed)
PT  AWARE./CY 

## 2015-04-16 ENCOUNTER — Other Ambulatory Visit: Payer: Self-pay | Admitting: Gastroenterology

## 2015-04-17 NOTE — Telephone Encounter (Signed)
RX sent to pharm. No refills given. Patient needs to make appt for future refills.

## 2015-05-02 ENCOUNTER — Ambulatory Visit (INDEPENDENT_AMBULATORY_CARE_PROVIDER_SITE_OTHER): Payer: Medicaid Other | Admitting: Internal Medicine

## 2015-05-02 ENCOUNTER — Encounter: Payer: Self-pay | Admitting: Internal Medicine

## 2015-05-02 VITALS — BP 118/70 | HR 67 | Temp 97.5°F | Ht 66.0 in | Wt 197.5 lb

## 2015-05-02 DIAGNOSIS — Z23 Encounter for immunization: Secondary | ICD-10-CM | POA: Diagnosis not present

## 2015-05-02 DIAGNOSIS — K219 Gastro-esophageal reflux disease without esophagitis: Secondary | ICD-10-CM | POA: Diagnosis not present

## 2015-05-02 DIAGNOSIS — E108 Type 1 diabetes mellitus with unspecified complications: Secondary | ICD-10-CM

## 2015-05-02 DIAGNOSIS — J45909 Unspecified asthma, uncomplicated: Secondary | ICD-10-CM

## 2015-05-02 LAB — POCT GLYCOSYLATED HEMOGLOBIN (HGB A1C): Hemoglobin A1C: 9

## 2015-05-02 LAB — BASIC METABOLIC PANEL WITH GFR
BUN: 14 mg/dL (ref 7–25)
CALCIUM: 9.8 mg/dL (ref 8.6–10.4)
CO2: 24 mmol/L (ref 20–31)
Chloride: 103 mmol/L (ref 98–110)
Creat: 1.36 mg/dL — ABNORMAL HIGH (ref 0.50–0.99)
GFR, Est African American: 48 mL/min — ABNORMAL LOW (ref >=60)
GFR, Est Non African American: 41 mL/min — ABNORMAL LOW (ref >=60)
Glucose, Bld: 172 mg/dL — ABNORMAL HIGH (ref 65–99)
Potassium: 4 mmol/L (ref 3.5–5.3)
SODIUM: 143 mmol/L (ref 135–146)

## 2015-05-02 MED ORDER — ESOMEPRAZOLE MAGNESIUM 40 MG PO CPDR: 40.0000 mg | DELAYED_RELEASE_CAPSULE | Freq: Every day | ORAL | Status: DC

## 2015-05-02 MED ORDER — MOMETASONE FURO-FORMOTEROL FUM 200-5 MCG/ACT IN AERO: 2.0000 | INHALATION_SPRAY | Freq: Two times a day (BID) | RESPIRATORY_TRACT | Status: DC

## 2015-05-02 NOTE — Assessment & Plan Note (Signed)
Will restart Nexium, per patient the only PPI that works

## 2015-05-02 NOTE — Assessment & Plan Note (Signed)
DM A1C 9.0   -Patient currently on Lantus 15 units daily and Novolog 4 units twice a day.  - Denies any episodes of hypoglycemia  - Will get BMP today  - Will get microablumin today  - Depending on SCr will restart patient on Metformin, per patient previously well controlled with metoformin  - Will further discuss nutrition  - Gave patient a sheet to record blood sugars over the next week  - Will need to perform Diabetic foot exam at next visit.

## 2015-05-02 NOTE — Progress Notes (Signed)
Patient ID: [name], female   DOB: [birth year], 64 y.o.   MRN: [id number]   [name] Toni Chars, MD Phone: [phone]  Subjective:   # Establish care visit. Discussed medical hx, medication list, etc.   All relevant systems were reviewed and were negative unless otherwise noted in the HPI  Past Medical History Reviewed problem list.  Medications- reviewed and updated Current Outpatient Prescriptions  Medication Sig Dispense Refill  . albuterol (PROVENTIL HFA;VENTOLIN HFA) 108 (90 BASE) MCG/ACT inhaler Inhale into the lungs daily as needed for wheezing or shortness of breath.    Marland Kitchen atorvastatin (LIPITOR) 40 MG tablet Take 40 mg by mouth daily.  0  . calcium carbonate (OS-CAL) 600 MG TABS Take 1 tablet (600 mg total) by mouth daily.    . cholecalciferol (VITAMIN D) 1000 UNITS tablet Take 1,000 Units by mouth 2 (two) times daily.     . Cranberry 500 MG CAPS Take 500 mg by mouth 2 (two) times daily.    Marland Kitchen docusate sodium (COLACE) 100 MG capsule Take 100 mg by mouth 2 (two) times daily as needed for constipation. For stool softner    . dofetilide ([name]) 250 MCG capsule Take 1 capsule (250 mcg total) by mouth 2 (two) times daily. 180 capsule 4  . doxazosin (CARDURA) 4 MG tablet Take 4 mg by mouth daily.    . DULoxetine (CYMBALTA) 60 MG capsule Take 60 mg by mouth daily.    . ferrous sulfate 325 (65 FE) MG tablet Take 325 mg by mouth daily with breakfast.    . Insulin Glargine (LANTUS SOLOSTAR) 100 UNIT/ML Solostar Pen Inject 25 Units into the skin at bedtime. 15 mL 3  . LATUDA 20 MG TABS Take 20 mg by mouth at bedtime.  0  . levETIRAcetam (KEPPRA XR) 500 MG 24 hr tablet Take 4 tablets (2,000 mg total) by mouth at bedtime. 120 tablet 12  . loratadine (CLARITIN) 10 MG tablet Take 10 mg by mouth every morning.    . mometasone-formoterol (DULERA) 200-5 MCG/ACT AERO Inhale 2 puffs into the lungs 2 (two) times daily. 1 Inhaler 4  . NEXIUM 40 MG capsule take 1  capsule by mouth twice a day WITH MEALS 60 capsule 0  . nitroGLYCERIN (NITROSTAT) 0.4 MG SL tablet Place 0.4 mg under the tongue every 5 (five) minutes as needed for chest pain (do nto exceed 3 doses).     . NOVOLOG FLEXPEN 100 UNIT/ML FlexPen Inject 4 Units as directed 2 (two) times daily.  0  . ondansetron (ZOFRAN-ODT) 8 MG disintegrating tablet Take 8 mg by mouth every 6 (six) hours as needed for nausea.    Marland Kitchen oxyCODONE-acetaminophen (PERCOCET) 10-325 MG per tablet Take 1 tablet by mouth every 8 (eight) hours as needed for pain. For pain    . potassium chloride SA (K-DUR,KLOR-CON) 20 MEQ tablet Take 40 mEq by mouth daily.  0  . pregabalin (LYRICA) 100 MG capsule Take 100 mg by mouth 3 (three) times daily.    . vitamin C (ASCORBIC ACID) 500 MG tablet Take 500 mg by mouth 2 (two) times daily.    . VOLTAREN 1 % GEL Apply 2 g topically 4 (four) times daily as needed. For pain  0  . XARELTO 20 MG TABS tablet take 1 tablet by mouth once daily WITH SUPPER 30 tablet 6  . esomeprazole (NEXIUM) 40 MG capsule Take 1 capsule (40 mg total) by mouth daily. 30 capsule 3   No  current facility-administered medications for this visit.   Chief complaint-noted No additions to family history Social history- patient is a non-smoker  Objective: BP 118/70 mmHg  Pulse 67  Temp(Src) 97.5 F (36.4 C) (Oral)  Ht 5\' 6"  (1.676 m)  Wt 197 lb 8 oz (89.585 kg)  BMI 31.89 kg/m2  SpO2 98% Gen: NAD, alert, cooperative with exam Neck: FROM, supple CV: RRR, good S1/S2, no murmur, cap refill <3 Resp: CTABL, no wheezes, non-labored Abd: SNTND, BS present, no guarding or organomegaly Ext: No edema, warm, normal tone, moves UE/LE spontaneously Neuro: Alert and oriented, No gross deficits Skin: no rashes no lesions  Assessment/Plan:  Diabetes mellitus DM A1C 9.0   -Patient currently on Lantus 15 units daily and Novolog 4 units twice a day.  - Denies any episodes of hypoglycemia  - Will get BMP today  - Will get  microablumin today  - Depending on SCr will restart patient on Metformin, per patient previously well controlled with metoformin  - Will further discuss nutrition  - Gave patient a sheet to record blood sugars over the next week  - Will need to perform Diabetic foot exam at next visit.   GERD (gastroesophageal reflux disease) Will restart Nexium, per patient the only PPI that works   Asthma - Refilled Dulera, albuterol continued  - Patient with increasing SOB - Will need consider patient seeing pulmonologist, hx of sacardosis per patient (no txt)

## 2015-05-02 NOTE — Patient Instructions (Signed)
Please follow up in a week for Diabetes, Lungs and Heart.  Will refill medications.  Will consider restarting Metformin in the future  Will discuss Jordan

## 2015-05-02 NOTE — Assessment & Plan Note (Signed)
-   Refilled Dulera, albuterol continued  - Patient with increasing SOB - Will need consider patient seeing pulmonologist, hx of sacardosis per patient (no txt)

## 2015-05-03 LAB — MICROALBUMIN / CREATININE URINE RATIO
Creatinine, Urine: 938.6 mg/dL
Microalb Creat Ratio: 6.9 mg/g (ref 0.0–30.0)
Microalb, Ur: 6.5 mg/dL — ABNORMAL HIGH (ref <2.0)

## 2015-05-08 ENCOUNTER — Other Ambulatory Visit: Payer: Self-pay

## 2015-05-08 DIAGNOSIS — Z1231 Encounter for screening mammogram for malignant neoplasm of breast: Secondary | ICD-10-CM

## 2015-05-13 ENCOUNTER — Ambulatory Visit: Payer: Medicaid Other | Admitting: Internal Medicine

## 2015-05-14 ENCOUNTER — Emergency Department (HOSPITAL_COMMUNITY)
Admission: EM | Admit: 2015-05-14 | Discharge: 2015-05-14 | Disposition: A | Discharge status: Home / Self Care | Payer: Medicaid Other | Attending: Physician Assistant | Admitting: Physician Assistant

## 2015-05-14 ENCOUNTER — Ambulatory Visit (INDEPENDENT_AMBULATORY_CARE_PROVIDER_SITE_OTHER): Payer: Medicaid Other | Admitting: Internal Medicine

## 2015-05-14 ENCOUNTER — Emergency Department (HOSPITAL_COMMUNITY): Payer: Medicaid Other

## 2015-05-14 ENCOUNTER — Encounter: Payer: Self-pay | Admitting: Internal Medicine

## 2015-05-14 ENCOUNTER — Ambulatory Visit (HOSPITAL_COMMUNITY)
Admission: RE | Admit: 2015-05-14 | Discharge: 2015-05-14 | Disposition: A | Discharge status: Home / Self Care | Payer: Medicaid Other | Source: Ambulatory Visit | Attending: Family Medicine | Admitting: Family Medicine

## 2015-05-14 ENCOUNTER — Other Ambulatory Visit: Payer: Self-pay

## 2015-05-14 VITALS — BP 130/69 | HR 59 | Temp 97.7°F | Ht 66.0 in | Wt 203.1 lb

## 2015-05-14 DIAGNOSIS — G8929 Other chronic pain: Secondary | ICD-10-CM

## 2015-05-14 DIAGNOSIS — IMO0002 Reserved for concepts with insufficient information to code with codable children: Secondary | ICD-10-CM

## 2015-05-14 DIAGNOSIS — Z79899 Other long term (current) drug therapy: Secondary | ICD-10-CM | POA: Diagnosis not present

## 2015-05-14 DIAGNOSIS — N183 Chronic kidney disease, stage 3 unspecified: Secondary | ICD-10-CM

## 2015-05-14 DIAGNOSIS — R079 Chest pain, unspecified: Secondary | ICD-10-CM | POA: Diagnosis not present

## 2015-05-14 DIAGNOSIS — I10 Essential (primary) hypertension: Secondary | ICD-10-CM | POA: Insufficient documentation

## 2015-05-14 DIAGNOSIS — Z86018 Personal history of other benign neoplasm: Secondary | ICD-10-CM | POA: Diagnosis not present

## 2015-05-14 DIAGNOSIS — J45909 Unspecified asthma, uncomplicated: Secondary | ICD-10-CM | POA: Diagnosis not present

## 2015-05-14 DIAGNOSIS — Z7901 Long term (current) use of anticoagulants: Secondary | ICD-10-CM | POA: Diagnosis not present

## 2015-05-14 DIAGNOSIS — K219 Gastro-esophageal reflux disease without esophagitis: Secondary | ICD-10-CM | POA: Insufficient documentation

## 2015-05-14 DIAGNOSIS — G40909 Epilepsy, unspecified, not intractable, without status epilepticus: Secondary | ICD-10-CM | POA: Diagnosis not present

## 2015-05-14 DIAGNOSIS — F329 Major depressive disorder, single episode, unspecified: Secondary | ICD-10-CM | POA: Diagnosis not present

## 2015-05-14 DIAGNOSIS — F419 Anxiety disorder, unspecified: Secondary | ICD-10-CM | POA: Diagnosis not present

## 2015-05-14 DIAGNOSIS — Z7951 Long term (current) use of inhaled steroids: Secondary | ICD-10-CM | POA: Diagnosis not present

## 2015-05-14 DIAGNOSIS — E785 Hyperlipidemia, unspecified: Secondary | ICD-10-CM | POA: Diagnosis not present

## 2015-05-14 DIAGNOSIS — Z87442 Personal history of urinary calculi: Secondary | ICD-10-CM | POA: Diagnosis not present

## 2015-05-14 DIAGNOSIS — E119 Type 2 diabetes mellitus without complications: Secondary | ICD-10-CM | POA: Insufficient documentation

## 2015-05-14 DIAGNOSIS — E118 Type 2 diabetes mellitus with unspecified complications: Secondary | ICD-10-CM | POA: Diagnosis not present

## 2015-05-14 DIAGNOSIS — Z86010 Personal history of colonic polyps: Secondary | ICD-10-CM | POA: Insufficient documentation

## 2015-05-14 DIAGNOSIS — M549 Dorsalgia, unspecified: Secondary | ICD-10-CM | POA: Insufficient documentation

## 2015-05-14 DIAGNOSIS — G43909 Migraine, unspecified, not intractable, without status migrainosus: Secondary | ICD-10-CM | POA: Insufficient documentation

## 2015-05-14 DIAGNOSIS — M1990 Unspecified osteoarthritis, unspecified site: Secondary | ICD-10-CM | POA: Insufficient documentation

## 2015-05-14 DIAGNOSIS — I480 Paroxysmal atrial fibrillation: Secondary | ICD-10-CM | POA: Insufficient documentation

## 2015-05-14 DIAGNOSIS — M25551 Pain in right hip: Secondary | ICD-10-CM | POA: Insufficient documentation

## 2015-05-14 DIAGNOSIS — E1165 Type 2 diabetes mellitus with hyperglycemia: Secondary | ICD-10-CM

## 2015-05-14 DIAGNOSIS — Z794 Long term (current) use of insulin: Secondary | ICD-10-CM | POA: Diagnosis not present

## 2015-05-14 DIAGNOSIS — M25559 Pain in unspecified hip: Secondary | ICD-10-CM

## 2015-05-14 DIAGNOSIS — Z9889 Other specified postprocedural states: Secondary | ICD-10-CM | POA: Insufficient documentation

## 2015-05-14 HISTORY — DX: Chronic kidney disease, stage 3 unspecified: N18.30

## 2015-05-14 LAB — BASIC METABOLIC PANEL
Anion gap: 9 (ref 5–15)
BUN: 10 mg/dL (ref 6–20)
CALCIUM: 9.5 mg/dL (ref 8.9–10.3)
CO2: 24 mmol/L (ref 22–32)
CREATININE: 1.14 mg/dL — AB (ref 0.44–1.00)
Chloride: 104 mmol/L (ref 101–111)
GFR calc non Af Amer: 50 mL/min — ABNORMAL LOW (ref >60)
GFR, EST AFRICAN AMERICAN: 58 mL/min — AB (ref >60)
GLUCOSE: 127 mg/dL — AB (ref 65–99)
Potassium: 4.1 mmol/L (ref 3.5–5.1)
Sodium: 137 mmol/L (ref 135–145)

## 2015-05-14 LAB — CBC
HCT: 36.1 % (ref 36.0–46.0)
Hemoglobin: 11.8 g/dL — ABNORMAL LOW (ref 12.0–15.0)
MCH: 29.1 pg (ref 26.0–34.0)
MCHC: 32.7 g/dL (ref 30.0–36.0)
MCV: 88.9 fL (ref 78.0–100.0)
PLATELETS: 140 10*3/uL — AB (ref 150–400)
RBC: 4.06 MIL/uL (ref 3.87–5.11)
RDW: 13.2 % (ref 11.5–15.5)
WBC: 3.7 10*3/uL — AB (ref 4.0–10.5)

## 2015-05-14 LAB — I-STAT TROPONIN, ED: Troponin i, poc: 0 ng/mL (ref 0.00–0.08)

## 2015-05-14 LAB — TROPONIN I: Troponin I: <0.03 ng/mL (ref <0.031)

## 2015-05-14 MED ORDER — OXYCODONE-ACETAMINOPHEN 5-325 MG PO TABS
1.0000 | ORAL_TABLET | Freq: Once | ORAL | Status: AC
  Administered 2015-05-14: 1 via ORAL
  Filled 2015-05-14: qty 1

## 2015-05-14 NOTE — ED Provider Notes (Signed)
CSN: [id number]     Arrival date & time 05/14/15  1215 History   First MD Initiated Contact with Patient 05/14/15 1348     Chief Complaint  Patient presents with  . Chest Pain  . Hip Pain     (Consider location/radiation/quality/duration/timing/severity/associated sxs/prior Treatment) HPI   Pt is a 64 year oold female with chronic pain and afib on xarlto presenting from clinic with hip pain. Patient discussed her hip pain that she woke up with this morning. Still ambulatory. NO histoyr of falls. Just an aching in the hip similar to her chronic back pain. When reading why she was sent here, she commented about chest pain to her PCP.  When asked about that she said that she had some chest pains this monring, feels improved now.   Past Medical History  Diagnosis Date  . Essential hypertension   . Hyperlipidemia   . Colon polyps   . Diverticulosis   . Gallstones   . IBS (irritable bowel syndrome)   . Kidney stones   . Paroxysmal a-fib     Coumadin (Chads2=2) Echo 09/05/10 EF 55-60%; mod LVH  . Depression   . Asthma     Dr. [name]  . Sarcoidosis of lung     Dr. [name]  . Fibromyalgia   . Peripheral neuropathy   . Type II diabetes mellitus   . H/O hiatal hernia   . GERD (gastroesophageal reflux disease)   . Migraine   . Epilepsy   . Arthritis   . Chronic back pain   . Anxiety   . History of cardiac catheterization     Normal coronaries 2012  . Atrial fibrillation    Past Surgical History  Procedure Laterality Date  . Closed reduction ankle fracture Left 10/2006  . Resection of hand neuroma Left 01/2002  . Neuroplasty / transposition median nerve at carpal tunnel Left 2004  . Esophageal manometry  03/21/2012    Procedure: ESOPHAGEAL MANOMETRY (EM);  Surgeon: [name], MD;  Location: [facility];  Service: Endoscopy;  Laterality: N/A;  . Colon surgery    . Anterior cervical decomp/discectomy fusion  07/11/2012    Procedure: ANTERIOR CERVICAL DECOMPRESSION/DISCECTOMY  FUSION 1 LEVEL;  Surgeon: [name], MD;  Location: [facility];  Service: Neurosurgery;  Laterality: N/A;  Cervical Five-Six Anterior Cervical Decompression with Fusion Interbody Prothesis Plating and Bonegraft  . Cholecystectomy  1976  . Neuroplasty / transposition median nerve at carpal tunnel Right 2002  . Abdominal hysterectomy  1995  . Salpingoophorectomy Bilateral 2000  . Dilation and curettage of uterus    . Tubal ligation  1982  . Fracture surgery    . Cardiac catheterization  2012   Family History  Problem Relation Age of Onset  . Heart attack Mother     @ age 27  . Mental illness Mother   . Diabetes Mother   . Hypertension Mother     siblings  . Alzheimer's disease Mother   . Depression Mother   . Hyperlipidemia Mother   . Heart attack Brother 50  . Alcohol abuse Brother   . Depression Brother   . Diabetes Brother   . Hyperlipidemia Brother   . Hypertension Brother   . Kidney disease Brother   . Drug abuse Brother   . Colon cancer Maternal Aunt   . Prostate cancer Maternal Grandfather   . Diabetes Maternal Grandfather   . Hyperlipidemia Maternal Grandfather   . Ovarian cancer Maternal Aunt   .  Diabetes    . Hypertension    . Lupus Sister   . Alcohol abuse Sister   . Depression Sister   . Diabetes Sister   . Hyperlipidemia Sister   . Hypertension Sister   . Kidney disease Sister   . Drug abuse Sister   . Ovarian cancer Cousin   . Alcohol abuse Father   . Heart attack Father   . Hyperlipidemia Father   . Hypertension Father   . Diabetes Maternal Grandmother   . Hyperlipidemia Maternal Grandmother    Social History  Substance Use Topics  . Smoking status: Never Smoker   . Smokeless tobacco: Never Used  . Alcohol Use: No   OB History    No data available     Review of Systems  Constitutional: Negative for activity change and fatigue.  HENT: Negative for congestion and drooling.   Eyes: Negative for discharge.  Respiratory: Negative for  cough and chest tightness.   Cardiovascular: Positive for chest pain.  Gastrointestinal: Negative for abdominal distention.  Genitourinary: Negative for dysuria and difficulty urinating.  Musculoskeletal: Positive for back pain and arthralgias. Negative for joint swelling.  Skin: Negative for rash.  Allergic/Immunologic: Negative for immunocompromised state.  Psychiatric/Behavioral: Negative for behavioral problems and agitation.      Allergies  Prednisone; Amitriptyline; and Hydromorphone hcl  Home Medications   Prior to Admission medications   Medication Sig Start Date End Date Taking? Authorizing Provider  albuterol (PROVENTIL HFA;VENTOLIN HFA) 108 (90 BASE) MCG/ACT inhaler Inhale into the lungs daily as needed for wheezing or shortness of breath.    Historical Provider, MD  atorvastatin (LIPITOR) 40 MG tablet Take 40 mg by mouth daily. 04/07/15   Historical Provider, MD  calcium carbonate (OS-CAL) 600 MG TABS Take 1 tablet (600 mg total) by mouth daily. 10/19/11   Tammy S Parrett, NP  cholecalciferol (VITAMIN D) 1000 UNITS tablet Take 1,000 Units by mouth 2 (two) times daily.     Historical Provider, MD  Cranberry 500 MG CAPS Take 500 mg by mouth 2 (two) times daily.    Historical Provider, MD  docusate sodium (COLACE) 100 MG capsule Take 100 mg by mouth 2 (two) times daily as needed for constipation. For stool softner    Historical Provider, MD  dofetilide ([name]) 250 MCG capsule Take 1 capsule (250 mcg total) by mouth 2 (two) times daily. 10/26/14   Wendall Stade, MD  doxazosin (CARDURA) 4 MG tablet Take 4 mg by mouth daily.    Historical Provider, MD  DULoxetine (CYMBALTA) 60 MG capsule Take 60 mg by mouth daily.    Historical Provider, MD  esomeprazole (NEXIUM) 40 MG capsule Take 1 capsule (40 mg total) by mouth daily. 05/02/15   Asiyah Mayra Reel, MD  ferrous sulfate 325 (65 FE) MG tablet Take 325 mg by mouth daily with breakfast.    Historical Provider, MD  Insulin Glargine  (LANTUS SOLOSTAR) 100 UNIT/ML Solostar Pen Inject 25 Units into the skin at bedtime. 09/11/14   Ripudeep Jenna Luo, MD  LATUDA 20 MG TABS Take 20 mg by mouth at bedtime. 03/25/15   Historical Provider, MD  levETIRAcetam (KEPPRA XR) 500 MG 24 hr tablet Take 4 tablets (2,000 mg total) by mouth at bedtime. 04/30/14   Suanne Marker, MD  loratadine (CLARITIN) 10 MG tablet Take 10 mg by mouth every morning.    Historical Provider, MD  mometasone-formoterol (DULERA) 200-5 MCG/ACT AERO Inhale 2 puffs into the lungs 2 (two) times daily. 05/02/15  Asiyah Mayra Reel, MD  NEXIUM 40 MG capsule take 1 capsule by mouth twice a day WITH MEALS 04/17/15   [name], MD  nitroGLYCERIN (NITROSTAT) 0.4 MG SL tablet Place 0.4 mg under the tongue every 5 (five) minutes as needed for chest pain (do nto exceed 3 doses).     Historical Provider, MD  NOVOLOG FLEXPEN 100 UNIT/ML FlexPen Inject 4 Units as directed 2 (two) times daily. 03/29/15   Historical Provider, MD  ondansetron (ZOFRAN-ODT) 8 MG disintegrating tablet Take 8 mg by mouth every 6 (six) hours as needed for nausea.    Historical Provider, MD  oxyCODONE-acetaminophen (PERCOCET) 10-325 MG per tablet Take 1 tablet by mouth every 8 (eight) hours as needed for pain. For pain 08/04/14   Vassie Loll, MD  potassium chloride SA (K-DUR,KLOR-CON) 20 MEQ tablet Take 40 mEq by mouth daily. 02/07/15   Historical Provider, MD  pregabalin (LYRICA) 100 MG capsule Take 100 mg by mouth 3 (three) times daily.    Historical Provider, MD  vitamin C (ASCORBIC ACID) 500 MG tablet Take 500 mg by mouth 2 (two) times daily.    Historical Provider, MD  VOLTAREN 1 % GEL Apply 2 g topically 4 (four) times daily as needed. For pain 08/13/14   Historical Provider, MD  XARELTO 20 MG TABS tablet take 1 tablet by mouth once daily WITH SUPPER 01/25/15   Wendall Stade, MD   BP 131/68 mmHg  Pulse 59  Temp(Src) 98 F (36.7 C)  Resp 14  SpO2 98% Physical Exam  Constitutional: She is oriented  to person, place, and time. She appears well-developed and well-nourished.  HENT:  Head: Normocephalic and atraumatic.  Eyes: Conjunctivae are normal. Right eye exhibits no discharge.  Neck: Neck supple.  Cardiovascular: Normal rate, regular rhythm and normal heart sounds.   No murmur heard. Pulmonary/Chest: Effort normal and breath sounds normal. She has no wheezes. She has no rales.  Abdominal: Soft. She exhibits no distension. There is no tenderness.  Musculoskeletal: Normal range of motion. She exhibits no edema.  Full active ROM, pain minimal with rom.pulses sensation intact. No erthyema.  Neurological: She is oriented to person, place, and time. No cranial nerve deficit.  Skin: Skin is warm and dry. No rash noted. She is not diaphoretic.  Psychiatric: She has a normal mood and affect. Her behavior is normal.  Nursing note and vitals reviewed.   ED Course  Procedures (including critical care time) Labs Review Labs Reviewed  CBC - Abnormal; Notable for the following:    WBC 3.7 (*)    Hemoglobin 11.8 (*)    Platelets 140 (*)    All other components within normal limits  BASIC METABOLIC PANEL  TROPONIN I  I-STAT TROPOININ, ED    Imaging Review Dg Chest 2 View  05/14/2015   CLINICAL DATA:  Chest pain  EXAM: CHEST  2 VIEW  COMPARISON:  09/05/2014 chest radiograph  FINDINGS: Surgical plate with interlocking screws overlies the lower cervical spine. Stable cardiomediastinal silhouette with top-normal heart size. Stable symmetric mild hilar prominence. No pneumothorax. No pleural effusion. Clear lungs, with no pulmonary edema. No focal lung opacity.  IMPRESSION: 1. No active cardiopulmonary disease. 2. Stable symmetric mild hilar prominence from mild bilateral hilar adenopathy as seen on 08/02/2014 chest CT angiogram, likely due to known sarcoidosis.   Electronically Signed   By: Delbert Phenix M.D.   On: 05/14/2015 14:12   I have personally reviewed and evaluated these images and lab  results as part of my medical decision-making.   EKG Interpretation   Date/Time:  Tuesday May 14 2015 12:23:09 EDT Ventricular Rate:  59 PR Interval:  186 QRS Duration: 82 QT Interval:  448 QTC Calculation: 443 R Axis:   -9 Text Interpretation:  Sinus bradycardia Low voltage QRS Nonspecific T wave  abnormality Abnormal ECG No significant change since last tracing  Confirmed by YAO  MD, DAVID ([id number]) on 05/14/2015 1:48:14 PM      MDM   Final diagnoses:  None    Pt here with acute on chronic pain. She went to pain managmetn yestereday. Will give one percocet here and have her continue to use pain managmenet for pain control. Will get xray to rule out fracture, have her follow up with PCP for further imagine as needed.  Likely exacerbation ofarthritis, she states it gets like this with weather change.  As for her chest pain, it sounds atypical, no diaphoresis not with exertion. Single troponin will suffice given in was > 5 hours ago. EKg is non ischemic.    Courteney Randall An, MD 05/14/15 1514

## 2015-05-14 NOTE — ED Notes (Signed)
Pt reports 1 day hx of atraumatic right hip pain. Denies recent injury. Reports hx of arthritis in same hip. Pt also states she's been having chest pains for the last week. States seen by PCP today and sent over with unremarkable EKG. Pt awake, alert, oriented x4, VSS, skin, warm, dry.

## 2015-05-14 NOTE — Progress Notes (Signed)
Patient ID: [name], female   DOB: 1950-10-03, 64 y.o.   MRN: [id number]     [name] [name], MD Phone: [phone]  Subjective:  #Right Hip Pain: Pain that radiates down to the knee. This pain is chronic in nature, patient with hx of herniated disc (per patient). Has worsened since yesterday evening. Patient states has sometimes has  tingling in toes.   #Chest pain: Hx of chronic chest pain, a-fib, and diabetes. Currently nauseated, diaphoresis, currently have left chest pain, as well as neck pain that radiates up neck. Has been also been have stomach pain. Chest pain under left breast, not mid-sternal. Not worse the general chest pain that patient has been having.   All relevant systems were reviewed and were negative unless otherwise noted in the HPI  Past Medical History Reviewed problem list.  Medications- reviewed and updated Current Outpatient Prescriptions  Medication Sig Dispense Refill  . albuterol (PROVENTIL HFA;VENTOLIN HFA) 108 (90 BASE) MCG/ACT inhaler Inhale into the lungs daily as needed for wheezing or shortness of breath.    Marland Kitchen atorvastatin (LIPITOR) 40 MG tablet Take 40 mg by mouth daily.  0  . calcium carbonate (OS-CAL) 600 MG TABS Take 1 tablet (600 mg total) by mouth daily.    . cholecalciferol (VITAMIN D) 1000 UNITS tablet Take 1,000 Units by mouth 2 (two) times daily.     . Cranberry 500 MG CAPS Take 500 mg by mouth 2 (two) times daily.    Marland Kitchen docusate sodium (COLACE) 100 MG capsule Take 100 mg by mouth 2 (two) times daily as needed for constipation. For stool softner    . dofetilide ([name]) 250 MCG capsule Take 1 capsule (250 mcg total) by mouth 2 (two) times daily. 180 capsule 4  . doxazosin (CARDURA) 4 MG tablet Take 4 mg by mouth daily.    . DULoxetine (CYMBALTA) 60 MG capsule Take 60 mg by mouth daily.    Marland Kitchen esomeprazole (NEXIUM) 40 MG capsule Take 1 capsule (40 mg total) by mouth daily. 30 capsule 3  . ferrous sulfate 325  (65 FE) MG tablet Take 325 mg by mouth daily with breakfast.    . Insulin Glargine (LANTUS SOLOSTAR) 100 UNIT/ML Solostar Pen Inject 25 Units into the skin at bedtime. 15 mL 3  . LATUDA 20 MG TABS Take 20 mg by mouth at bedtime.  0  . levETIRAcetam (KEPPRA XR) 500 MG 24 hr tablet Take 4 tablets (2,000 mg total) by mouth at bedtime. 120 tablet 12  . loratadine (CLARITIN) 10 MG tablet Take 10 mg by mouth every morning.    . mometasone-formoterol (DULERA) 200-5 MCG/ACT AERO Inhale 2 puffs into the lungs 2 (two) times daily. 1 Inhaler 4  . NEXIUM 40 MG capsule take 1 capsule by mouth twice a day WITH MEALS 60 capsule 0  . nitroGLYCERIN (NITROSTAT) 0.4 MG SL tablet Place 0.4 mg under the tongue every 5 (five) minutes as needed for chest pain (do nto exceed 3 doses).     . NOVOLOG FLEXPEN 100 UNIT/ML FlexPen Inject 4 Units as directed 2 (two) times daily.  0  . ondansetron (ZOFRAN-ODT) 8 MG disintegrating tablet Take 8 mg by mouth every 6 (six) hours as needed for nausea.    Marland Kitchen oxyCODONE-acetaminophen (PERCOCET) 10-325 MG per tablet Take 1 tablet by mouth every 8 (eight) hours as needed for pain. For pain    . potassium chloride SA (K-DUR,KLOR-CON) 20 MEQ tablet Take 40 mEq by mouth  daily.  0  . pregabalin (LYRICA) 100 MG capsule Take 100 mg by mouth 3 (three) times daily.    . vitamin C (ASCORBIC ACID) 500 MG tablet Take 500 mg by mouth 2 (two) times daily.    . VOLTAREN 1 % GEL Apply 2 g topically 4 (four) times daily as needed. For pain  0  . XARELTO 20 MG TABS tablet take 1 tablet by mouth once daily WITH SUPPER 30 tablet 6   No current facility-administered medications for this visit.   Chief complaint-noted No additions to family history Social history- patient is a  non smoker  Objective: BP 130/69 mmHg  Pulse 59  Temp(Src) 97.7 F (36.5 C) (Oral)  Ht  (1.676 m)  Wt 203 lb 1.6 oz (92.126 kg)  BMI 32.80 kg/m2 Gen: NAD, alert, cooperative with exam Neck: FROM, supple CV: RRR, good  S1/S2, no murmur, cap refill <3 Resp: CTABL, no wheezes, non-labored Ext: No edema, warm, normal tone, moves UE/LE spontaneously Neuro: Alert and oriented, No gross deficits Skin: no rashes no lesions  EKG - normal,  No Q waves, or ST elevation/depression   Assessment/Plan: See problem based a/p  Diabetes mellitus type 2, uncontrolled, with complications Type 2 Diabetes, uncontrolled, fasting blood sugars (120- 200). Taking Lantus 15 units/ Novolog 4 units with meals). A1C 9.0 (Goal 7.0)  - Will restart patient on metformin  - Patient not on any ACE for renal protection, will consider adding this per discussion with patient - NEEDS Diabetic foot exam   CHEST PAIN Active left lower chest pain, (not mid-sternal)  (Hx of diabetes and afib). Hx of chronic chest pain per patient (takes nitroglycerin as needed). Patient with active chest pain this am with diaphoresis, nausea, and pain radiating through the neck  - EKG was negative for ST elevation/depression or Q wave changes, T-wave changes - Due to atypical presentation often in women and diabetics, sent patient to the ED for troponin trending   Chronic kidney disease (CKD), stage III (moderate) Slight non-significant microabuminuria with GFR of 50   - Will consider starting lisinopril for renal protection  - Will need to consider blood pressure and revaluation of Creatinine if started at next visit

## 2015-05-14 NOTE — ED Notes (Signed)
Patient transported to X-ray 

## 2015-05-14 NOTE — Assessment & Plan Note (Signed)
Type 2 Diabetes, uncontrolled, fasting blood sugars (120- 200). Taking Lantus 15 units/ Novolog 4 units with meals). A1C 9.0 (Goal 7.0)  - Will restart patient on metformin  - Patient not on any ACE for renal protection, will consider adding this per discussion with patient - NEEDS Diabetic foot exam

## 2015-05-14 NOTE — Patient Instructions (Signed)
Will see you back in 1 week after ED follow up to discuss diabetes.

## 2015-05-14 NOTE — Assessment & Plan Note (Signed)
Active left lower chest pain, (not mid-sternal)  (Hx of diabetes and afib). Hx of chronic chest pain per patient (takes nitroglycerin as needed). Patient with active chest pain this am with diaphoresis, nausea, and pain radiating through the neck  - EKG was negative for ST elevation/depression or Q wave changes, T-wave changes - Due to atypical presentation often in women and diabetics, sent patient to the ED for troponin trending

## 2015-05-14 NOTE — Assessment & Plan Note (Signed)
Slight non-significant microabuminuria with GFR of 50   - Will consider starting lisinopril for renal protection  - Will need to consider blood pressure and revaluation of Creatinine if started at next visit

## 2015-05-22 ENCOUNTER — Ambulatory Visit (INDEPENDENT_AMBULATORY_CARE_PROVIDER_SITE_OTHER): Payer: Medicaid Other | Admitting: Internal Medicine

## 2015-05-22 ENCOUNTER — Encounter: Payer: Self-pay | Admitting: Internal Medicine

## 2015-05-22 VITALS — BP 142/65 | HR 67 | Temp 98.9°F | Ht 66.0 in | Wt 205.0 lb

## 2015-05-22 DIAGNOSIS — E118 Type 2 diabetes mellitus with unspecified complications: Secondary | ICD-10-CM

## 2015-05-22 DIAGNOSIS — E1165 Type 2 diabetes mellitus with hyperglycemia: Secondary | ICD-10-CM | POA: Diagnosis not present

## 2015-05-22 DIAGNOSIS — N183 Chronic kidney disease, stage 3 unspecified: Secondary | ICD-10-CM

## 2015-05-22 DIAGNOSIS — F319 Bipolar disorder, unspecified: Secondary | ICD-10-CM | POA: Diagnosis not present

## 2015-05-22 DIAGNOSIS — Z23 Encounter for immunization: Secondary | ICD-10-CM | POA: Diagnosis not present

## 2015-05-22 MED ORDER — METFORMIN HCL 500 MG PO TABS: 500.0000 mg | ORAL_TABLET | Freq: Two times a day (BID) | ORAL | Status: DC

## 2015-05-22 NOTE — Assessment & Plan Note (Addendum)
Diabetes A1C 9.0 9/15  - Continue Lantus 25 QD  - CBG's 150s in the morning and 180s in the afternoon - Will restart metformin 500 mg  per discussion with Dr. Raymondo Band (as patient has had several BMPs with GFR> 30 )  - Discussed diabetic foot precautions with patient, patient with some diabetic neuropathy  - Will recheck A1C in 2 months

## 2015-05-22 NOTE — Patient Instructions (Addendum)
Want to see you back in 2 months to recheck HA1C at that time. Diabetic Neuropathy Diabetic neuropathy is a nerve disease or nerve damage that is caused by diabetes mellitus. About half of all people with diabetes mellitus have some form of nerve damage. Nerve damage is more common in those who have had diabetes mellitus for many years and who generally have not had good control of their blood sugar (glucose) level. Diabetic neuropathy is a common complication of diabetes mellitus. There are three common types of diabetic neuropathy and a fourth type that is less common and less understood:   Peripheral neuropathy--This is the most common type of diabetic neuropathy. It causes damage to the nerves of the feet and legs first and then eventually the hands and arms.The damage affects the ability to sense touch.  Autonomic neuropathy--This type causes damage to the autonomic nervous system, which controls the following functions:  Heartbeat.  Body temperature.  Blood pressure.  Urination.  Digestion.  Sweating.  Sexual function.  Focal neuropathy--Focal neuropathy can be painful and unpredictable and occurs most often in older adults with diabetes mellitus. It involves a specific nerve or one area and often comes on suddenly. It usually does not cause long-term problems.  Radiculoplexus neuropathy-- Sometimes called lumbosacral radiculoplexus neuropathy, radiculoplexus neuropathy affects the nerves of the thighs, hips, buttocks, or legs. It is more common in people with type 2 diabetes mellitus and in older men. It is characterized by debilitating pain, weakness, and atrophy, usually in the thigh muscles. CAUSES  The cause of peripheral, autonomic, and focal neuropathies is diabetes mellitus that is uncontrolled and high glucose levels. The cause of radiculoplexus neuropathy is unknown. However, it is thought to be caused by inflammation related to uncontrolled glucose levels. SIGNS AND  SYMPTOMS  Peripheral Neuropathy Peripheral neuropathy develops slowly over time. When the nerves of the feet and legs no longer work there may be:   Burning, stabbing, or aching pain in the legs or feet.  Inability to feel pressure or pain in your feet. This can lead to:  Thick calluses over pressure areas.  Pressure sores.  Ulcers.  Foot deformities.  Reduced ability to feel temperature changes.  Muscle weakness. Autonomic Neuropathy The symptoms of autonomic neuropathy vary depending on which nerves are affected. Symptoms may include:  Problems with digestion, such as:  Feeling sick to your stomach (nausea).  Vomiting.  Bloating.  Constipation.  Diarrhea.  Abdominal pain.  Difficulty with urination. This occurs if you lose your ability to sense when your bladder is full. Problems include:  Urine leakage (incontinence).  Inability to empty your bladder completely (retention).  Rapid or irregular heartbeat (palpitations).  Blood pressure drops when you stand up (orthostatic hypotension). When you stand up you may feel:  Dizzy.  Weak.  Faint.  In men, inability to attain and maintain an erection.  In women, vaginal dryness and problems with decreased sexual desire and arousal.  Problems with body temperature regulation.  Increased or decreased sweating. Focal Neuropathy  Abnormal eye movements or abnormal alignment of both eyes.  Weakness in the wrist.  Foot drop. This results in an inability to lift the foot properly and abnormal walking or foot movement.  Paralysis on one side of your face (Bell palsy).  Chest or abdominal pain. Radiculoplexus Neuropathy  Sudden, severe pain in your hip, thigh, or buttocks.  Weakness and wasting of thigh muscles.  Difficulty rising from a seated position.  Abdominal swelling.  Unexplained weight loss (usually  more than 10 lb [4.5 kg]). DIAGNOSIS  Peripheral Neuropathy Your senses may be tested.  Sensory function testing can be done with:  A light touch using a monofilament.  A vibration with tuning fork.  A sharp sensation with a pin prick. Other tests that can help diagnose neuropathy are:  Nerve conduction velocity. This test checks the transmission of an electrical current through a nerve.  Electromyography. This shows how muscles respond to electrical signals transmitted by nearby nerves.  Quantitative sensory testing. This is used to assess how your nerves respond to vibrations and changes in temperature. Autonomic Neuropathy Diagnosis is often based on reported symptoms. Tell your health care provider if you experience:   Dizziness.   Constipation.   Diarrhea.   Inappropriate urination or inability to urinate.   Inability to get or maintain an erection.  Tests that may be done include:   Electrocardiography or Holter monitor. These are tests that can help show problems with the heart rate or heart rhythm.   An X-ray exam may be done. Focal Neuropathy Diagnosis is made based on your symptoms and what your health care provider finds during your exam. Other tests may be done. They may include:  Nerve conduction velocities. This checks the transmission of electrical current through a nerve.  Electromyography. This shows how muscles respond to electrical signals transmitted by nearby nerves.  Quantitative sensory testing. This test is used to assess how your nerves respond to vibration and changes in temperature. Radiculoplexus Neuropathy  Often the first thing is to eliminate any other issue or problems that might be the cause, as there is no stick test for diagnosis.  X-ray exam of your spine and lumbar region.  Spinal tap to rule out cancer.  MRI to rule out other lesions. TREATMENT  Once nerve damage occurs, it cannot be reversed. The goal of treatment is to keep the disease or nerve damage from getting worse and affecting more nerve fibers.  Controlling your blood glucose level is the key. Most people with radiculoplexus neuropathy see at least a partial improvement over time. You will need to keep your blood glucose and HbA1c levels in the target range determined by your health care provider. Things that help control blood glucose levels include:   Blood glucose monitoring.   Meal planning.   Physical activity.   Diabetes medicine.  Over time, maintaining lower blood glucose levels helps lessen symptoms. Sometimes, prescription pain medicine is needed. HOME CARE INSTRUCTIONS:  Do not smoke.  Keep your blood glucose level in the range that you and your health care provider have determined acceptable for you.  Keep your blood pressure level in the range that you and your health care provider have determined acceptable for you.  Eat a well-balanced diet.  Be physically active every day. Include strength training and balance exercises.  Protect your feet.  Check your feet every day for sores, cuts, blisters, or signs of infection.  Wear padded socks and supportive shoes. Use orthotic inserts, if necessary.  Regularly check the insides of your shoes for worn spots. Make sure there are no rocks or other items inside your shoes before you put them on. SEEK MEDICAL CARE IF:   You have burning, stabbing, or aching pain in the legs or feet.  You are unable to feel pressure or pain in your feet.  You develop problems with digestion such as:  Nausea.  Vomiting.  Bloating.  Constipation.  Diarrhea.  Abdominal pain.  You have difficulty  with urination, such as:  Incontinence.  Retention.  You have palpitations.  You develop orthostatic hypotension. When you stand up you may feel:  Dizzy.  Weak.  Faint.  You cannot attain and maintain an erection (in men).  You have vaginal dryness and problems with decreased sexual desire and arousal (in women).  You have severe pain in your thighs, legs, or  buttocks.  You have unexplained weight loss.   This information is not intended to replace advice given to you by your health care provider. Make sure you discuss any questions you have with your health care provider.   Document Released: 10/12/2001 Document Revised: 08/24/2014 Document Reviewed: 01/12/2013 Elsevier Interactive Patient Education [employer]

## 2015-05-22 NOTE — Assessment & Plan Note (Signed)
-    Patient has hx of nausea and low blood pressures on lisinopril and Coozar, discussed with patient considering something for renal protection  -  Will need to continue to recheck BMP.

## 2015-05-22 NOTE — Progress Notes (Signed)
Patient ID: [name], female   DOB: 1951-06-06, 64 y.o.   MRN: [id number]    [name] [name], MD Phone: [phone]  Subjective:   # Chronic Kidney Disease: Patient with slight Microabuminuria. Discussed starting patient on an ACE, per patient was on lisinopril in the past and makes her nauseated and dizzy. Patient states she was on Cozaar but had low blood pressures 90s. Therefore Dr. [name] took her off these.  # Diabetes: Patient blood sugars are improving. AM CBGs mostly in the 150s and afternoon CBGs  within the 180s. Patient had eye exam in March.   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 Patient is not sure of the name.   # No chest pain/ seeing pain clinic for chronic hip pain.  All relevant systems were reviewed and were negative unless otherwise noted in the HPI  Past Medical History Reviewed problem list.  Medications- reviewed and updated Current Outpatient Prescriptions  Medication Sig Dispense Refill  . albuterol (PROVENTIL HFA;VENTOLIN HFA) 108 (90 BASE) MCG/ACT inhaler Inhale into the lungs daily as needed for wheezing or shortness of breath.    Marland Kitchen atorvastatin (LIPITOR) 40 MG tablet Take 40 mg by mouth daily.  0  . calcium carbonate (OS-CAL) 600 MG TABS Take 1 tablet (600 mg total) by mouth daily.    . cholecalciferol (VITAMIN D) 1000 UNITS tablet Take 1,000 Units by mouth 2 (two) times daily.     . Cranberry 500 MG CAPS Take 500 mg by mouth 2 (two) times daily.    Marland Kitchen docusate sodium (COLACE) 100 MG capsule Take 100 mg by mouth 2 (two) times daily as needed for constipation. For stool softner    . dofetilide ([name]) 250 MCG capsule Take 1 capsule (250 mcg total) by mouth 2 (two) times daily. 180 capsule 4  . doxazosin (CARDURA) 4 MG tablet Take 4 mg by mouth daily.    . DULoxetine (CYMBALTA) 60 MG capsule Take 60 mg by mouth daily.    Marland Kitchen esomeprazole (NEXIUM) 40 MG capsule Take 1 capsule  (40 mg total) by mouth daily. 30 capsule 3  . ferrous sulfate 325 (65 FE) MG tablet Take 325 mg by mouth daily with breakfast.    . Insulin Glargine (LANTUS SOLOSTAR) 100 UNIT/ML Solostar Pen Inject 25 Units into the skin at bedtime. 15 mL 3  . LATUDA 20 MG TABS Take 20 mg by mouth at bedtime.  0  . levETIRAcetam (KEPPRA XR) 500 MG 24 hr tablet Take 4 tablets (2,000 mg total) by mouth at bedtime. 120 tablet 12  . loratadine (CLARITIN) 10 MG tablet Take 10 mg by mouth every morning.    . mometasone-formoterol (DULERA) 200-5 MCG/ACT AERO Inhale 2 puffs into the lungs 2 (two) times daily. 1 Inhaler 4  . NEXIUM 40 MG capsule take 1 capsule by mouth twice a day WITH MEALS 60 capsule 0  . nitroGLYCERIN (NITROSTAT) 0.4 MG SL tablet Place 0.4 mg under the tongue every 5 (five) minutes as needed for chest pain (do nto exceed 3 doses).     . NOVOLOG FLEXPEN 100 UNIT/ML FlexPen Inject 4 Units as directed 2 (two) times daily.  0  . ondansetron (ZOFRAN-ODT) 8 MG disintegrating tablet Take 8 mg by mouth every 6 (six) hours as needed for nausea.    Marland Kitchen oxyCODONE-acetaminophen (PERCOCET) 10-325 MG per tablet Take 1 tablet by mouth every 8 (eight) hours as needed for pain. For pain    .  potassium chloride SA (K-DUR,KLOR-CON) 20 MEQ tablet Take 40 mEq by mouth daily.  0  . pregabalin (LYRICA) 100 MG capsule Take 100 mg by mouth 3 (three) times daily.    . vitamin C (ASCORBIC ACID) 500 MG tablet Take 500 mg by mouth 2 (two) times daily.    . VOLTAREN 1 % GEL Apply 2 g topically 4 (four) times daily as needed. For pain  0  . XARELTO 20 MG TABS tablet take 1 tablet by mouth once daily WITH SUPPER 30 tablet 6   No current facility-administered medications for this visit.   Chief complaint-noted No additions to family history Social history- patient is a non-smoker  Objective: There were no vitals taken for this visit. Gen: NAD, alert, cooperative with exam CV: RRR, good S1/S2, no murmur, cap refill <3 Resp:  CTABL, no wheezes, non-labored Skin: no rashes no lesions Foot Exam: DP pulses 2+,   Assessment/Plan: See problem based a/p  Diabetes mellitus type 2, uncontrolled, with complications Diabetes A1C 9.0 9/15  - Continue Lantus 25 QD  - CBG's 150s in the morning and 180s in the afternoon - Will restart metformin 500 mg  per discussion with Dr. Raymondo Band (as patient has had several BMPs with GFR> 30 )  - Discussed diabetic foot precautions with patient, patient with some diabetic neuropathy  - Will recheck A1C in 2 months    Chronic kidney disease (CKD), stage III (moderate) -  Patient has hx of nausea and low blood pressures on lisinopril and Coozar, discussed with patient considering something for renal protection  -  Will need to continue to recheck BMP.

## 2015-05-24 ENCOUNTER — Encounter: Payer: Self-pay | Admitting: Adult Health

## 2015-05-24 ENCOUNTER — Other Ambulatory Visit: Payer: Self-pay | Admitting: Diagnostic Neuroimaging

## 2015-05-24 ENCOUNTER — Ambulatory Visit (INDEPENDENT_AMBULATORY_CARE_PROVIDER_SITE_OTHER): Payer: Medicaid Other | Admitting: Adult Health

## 2015-05-24 VITALS — BP 110/68 | HR 72 | Temp 97.8°F | Ht 66.0 in | Wt 206.0 lb

## 2015-05-24 DIAGNOSIS — J4531 Mild persistent asthma with (acute) exacerbation: Secondary | ICD-10-CM | POA: Diagnosis not present

## 2015-05-24 DIAGNOSIS — D869 Sarcoidosis, unspecified: Secondary | ICD-10-CM | POA: Diagnosis not present

## 2015-05-24 MED ORDER — LEVALBUTEROL HCL 0.63 MG/3ML IN NEBU
0.6300 mg | INHALATION_SOLUTION | Freq: Once | RESPIRATORY_TRACT | Status: AC
  Administered 2015-05-24: 0.63 mg via RESPIRATORY_TRACT

## 2015-05-24 MED ORDER — AMOXICILLIN-POT CLAVULANATE 875-125 MG PO TABS: 1.0000 | ORAL_TABLET | Freq: Two times a day (BID) | ORAL | Status: AC

## 2015-05-24 NOTE — Assessment & Plan Note (Signed)
Mild flare with URI  xopenex neb x 1   Plan  Augmentin  Twice daily  For 7 days  Mucinex DM Twice daily  As needed  Cough/congestion  Fluids and rest  Please contact office for sooner follow up if symptoms do not improve or worsen or seek emergency care  follow up Dr. [name]  4 weeks with PFT

## 2015-05-24 NOTE — Progress Notes (Signed)
  Subjective:    Patient ID: [name], female    DOB: 10-12-1950, 64 y.o.   MRN: [id number]  HPI 40  yobf never smoker clinical dx with sarcoid here 1995 neg tbbx but classic cxr which gradually improved    Requiring  chronic prednisone referred 09/24/2011 to pulmonary clinic by Dr Christella Hartigan for cough.  . 05/24/2015 Acute OV  Last seen in office in 2013 .  Pt present for an acute office visit. Complains of chest tightness, dyspnea at times, and a dry cough. Sinus pressure and drainage, nausa at times x 1 week. 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  CXR done last week with nad, chronic sarcoid changes.  No chest pain, orthopnea,edema , fever, hemotpysis , rash.    ROS:  Constitutional:   No  weight loss, night sweats,  Fevers, chills,  +fatigue, or  lassitude.  HEENT:   No headaches,  Difficulty swallowing,  Tooth/dental problems, or  Sore throat,                No sneezing, itching, ear ache,  +nasal congestion, post nasal drip,   CV:  No chest pain,  Orthopnea, PND, swelling in lower extremities, anasarca, dizziness, palpitations, syncope.   GI  No   abdominal pain, nausea, vomiting, diarrhea, change in bowel habits, loss of appetite, bloody stools.   Resp:   No coughing up of blood.    No chest wall deformity  Skin: no rash or lesions.  GU: no dysuria, change in color of urine, no urgency or frequency.  No flank pain, no hematuria   MS:  No joint pain or swelling.  No decreased range of motion.  No back pain.  Psych:  No change in mood or affect. No depression or anxiety.  No memory loss.          Objective:   Physical Exam  Obese amb bf nad    HEENT: nl dentition, turbinates, and orophanx. Nl external ear canals without cough reflex   NECK :  without JVD/Nodes/TM/ nl carotid upstrokes bilaterally   LUNGS: Coarse BS w/ no wheezing    CV:  RRR  no s3 or  murmur or increase in P2, no edema   ABD:  soft and nontender with nl excursion in the supine position. No bruits or organomegaly, bowel sounds nl  MS:  warm without deformities, calf tenderness, cyanosis or clubbing  SKIN: warm and dry without lesions    NEURO:  alert, approp, no deficits        Assessment & Plan:

## 2015-05-24 NOTE — Progress Notes (Signed)
Chart and office note reviewed in detail  > agree with a/p as outlined    

## 2015-05-24 NOTE — Addendum Note (Signed)
Addended by: Karalee Height on: 05/24/2015 04:53 PM   Modules accepted: Orders

## 2015-05-24 NOTE — Assessment & Plan Note (Signed)
Check PFT on return  

## 2015-05-24 NOTE — Patient Instructions (Signed)
Augmentin  Twice daily  For 7 days  Mucinex DM Twice daily  As needed  Cough/congestion  Fluids and rest  Please contact office for sooner follow up if symptoms do not improve or worsen or seek emergency care  follow up Dr. [name]  4 weeks with PFT

## 2015-05-27 ENCOUNTER — Other Ambulatory Visit: Payer: Self-pay | Admitting: *Deleted

## 2015-05-27 MED ORDER — ALBUTEROL SULFATE HFA 108 (90 BASE) MCG/ACT IN AERS: 2.0000 | INHALATION_SPRAY | Freq: Four times a day (QID) | RESPIRATORY_TRACT | Status: DC | PRN

## 2015-06-06 ENCOUNTER — Telehealth: Payer: Self-pay | Admitting: Internal Medicine

## 2015-06-06 NOTE — Telephone Encounter (Signed)
Pt concerned that she is experiencing a reaction from starting a new medication, Metformin. She states that she is having severe abdominal cramps and can't really eat much. She states she can barely get down crackers and ginger-ale. Please advise pt at the earliest convenience. [name], ASA

## 2015-06-06 NOTE — Telephone Encounter (Signed)
Spoke with regarding metformin. Advised patient that some of the side effects of metformin are abdominal discomfort, n/v, diarrhea.  Patient stated feels like her stomach is ripping out.  Advised patient not to take medication until she hear back from PCP.  Please give patient a call ASAP.  Clovis PuMartin, Aziah Kaiser L, RN

## 2015-06-11 ENCOUNTER — Encounter: Payer: Self-pay | Admitting: Diagnostic Neuroimaging

## 2015-06-11 ENCOUNTER — Ambulatory Visit (INDEPENDENT_AMBULATORY_CARE_PROVIDER_SITE_OTHER): Payer: Medicaid Other | Admitting: Diagnostic Neuroimaging

## 2015-06-11 VITALS — BP 104/76 | HR 74

## 2015-06-11 DIAGNOSIS — G40909 Epilepsy, unspecified, not intractable, without status epilepticus: Secondary | ICD-10-CM

## 2015-06-11 MED ORDER — LEVETIRACETAM ER 500 MG PO TB24: 2000.0000 mg | ORAL_TABLET | Freq: Every day | ORAL | Status: DC

## 2015-06-11 NOTE — Progress Notes (Signed)
PATIENT: [name] DOB: 08-Jul-1951  REASON FOR VISIT: follow up HISTORY FROM: patient  Chief Complaint  Patient presents with  . Seizures    rmm 6  . Follow-up    1 year    HISTORY OF PRESENT ILLNESS:  UPDATE 06/11/15 (VRP): No seizures since May 2015. Since last visit, was doing well until Jan 2016, then had hypotension, syncope, bradycardia, and 6 day admission.  Also had multiple ER evaluations for chest pain and other issues.   UPDATE 04/30/14 (VRP): Since last visit, was doing well until May 2015 (had generalized convulsive seizure) then 1 week last went to hospital and dx'd with afib. Now doing better. Not driving.   UPDATE 10/27/13 (LL): Patient returns for followup, last visit was 04/12/13.  She reports that she has not been feeling so well in the past 6 months.  She unknowingly had not been getting refills on her Leviteracetam for 2 months and started having more seizures.  She then got a refill and has been back on Leviteracetam for a month, but still reports 3 seizures during that time, mostly during the night, when she wakes up after having been incontinent of bladder and bowel and with sore muscles.  She has other times during the day when she feels that she is having them in which she feels confused and notices she has lost a few minutes, but these are unwitnessed.  She has been titrated up on her Lyrica to 100 mg TID, still has neuropathy pain, but states she is very sleepy most of the time.  UPDATE 04/12/13 (LL): Patient returns for followup visit since last visit on 10/10/2012. She states that she has not had any more seizures or syncopal episodes since her last visit. She continues to take Keppra 500 mg twice a day with no known side effects. She reports that in earlier in the year she was placed on Neurontin 600 mg 3 times a day by another doctor and felt very badly. She reports having visual hallucinations, nausea, excessive diaphoresis. She had several trips to the  emergency room this year for heart rate and blood pressure issues, chest pain, nausea and vomiting, pyelonephritis, and abdominal pain. She has been evaluated by GI, and [employer] cardiology. She was taken off Neurontin which was replaced with Lyrica 50 mg 3 times a day by her pain management M.D. Dr. Kumar 2 months ago. Since this time she reports that she is doing much better. She has no complaints today.   PRIOR HPI 10/10/12 (VRP): 64 year old in female here for evaluation of seizure disorder. Patient had first seizure of life in the fifth grade. She was diagnosed with seizures and treated with phenobarbital. She had generalized convulsions, tongue biting and incontinence. She had seizures until the ninth grade and then to stop. At some point she was taken off phenobarbital because she no longer had seizures. 2012 patient began to have seizures again. She was having intermittent episodes of staring, being in a daze, followed by shaking, tongue biting, vomiting and incontinence. Patient had an episode last night as well.    REVIEW OF SYSTEMS: Full 14 system review of systems performed and notable only for: chills, fatigue, excessive sweating, facial swelling light sens double vision cough SOB palpitations cold intol heat intol excessive thirst joint pain aching muscles cramps speech diff memory loss dizziness passing out depression.   ALLERGIES: Allergies  Allergen Reactions  . Prednisone     SENT INTO AFIB   . Amitriptyline  Other (See Comments)    Disoriented.  . Hydromorphone Hcl Other (See Comments)    blood pressure drops.    HOME MEDICATIONS: Outpatient Prescriptions Prior to Visit  Medication Sig Dispense Refill  . albuterol (PROVENTIL HFA;VENTOLIN HFA) 108 (90 BASE) MCG/ACT inhaler Inhale 2 puffs into the lungs every 6 (six) hours as needed for wheezing or shortness of breath. 1 Inhaler 1  . atorvastatin (LIPITOR) 40 MG tablet Take 40 mg by mouth daily.  0  . calcium carbonate (OS-CAL)  600 MG TABS Take 1 tablet (600 mg total) by mouth daily.    . cholecalciferol (VITAMIN D) 1000 UNITS tablet Take 1,000 Units by mouth 2 (two) times daily.     . Cranberry 500 MG CAPS Take 500 mg by mouth 2 (two) times daily.    Marland Kitchen docusate sodium (COLACE) 100 MG capsule Take 100 mg by mouth 2 (two) times daily as needed for constipation. For stool softner    . dofetilide ([name]) 250 MCG capsule Take 1 capsule (250 mcg total) by mouth 2 (two) times daily. 180 capsule 4  . doxazosin (CARDURA) 4 MG tablet Take 4 mg by mouth daily.    . DULoxetine (CYMBALTA) 60 MG capsule Take 60 mg by mouth daily.    . ferrous sulfate 325 (65 FE) MG tablet Take 325 mg by mouth daily with breakfast.    . Insulin Glargine (LANTUS SOLOSTAR) 100 UNIT/ML Solostar Pen Inject 25 Units into the skin at bedtime. 15 mL 3  . lamoTRIgine (LAMICTAL) 25 MG tablet TAKE 1 TABLET BY MOUTH DAILY FOR 2 WEEKS; THEN INCREASE TO 2 TABLETS DAILY  0  . LATUDA 20 MG TABS Take 20 mg by mouth. Every other day  0  . levETIRAcetam (KEPPRA XR) 500 MG 24 hr tablet take 4 tablets by mouth at bedtime 120 tablet 0  . loratadine (CLARITIN) 10 MG tablet Take 10 mg by mouth every morning.    . mometasone-formoterol (DULERA) 200-5 MCG/ACT AERO Inhale 2 puffs into the lungs 2 (two) times daily. 1 Inhaler 4  . nitroGLYCERIN (NITROSTAT) 0.4 MG SL tablet Place 0.4 mg under the tongue every 5 (five) minutes as needed for chest pain (do nto exceed 3 doses).     . NOVOLOG FLEXPEN 100 UNIT/ML FlexPen Inject 4 Units as directed 2 (two) times daily.  0  . ondansetron (ZOFRAN-ODT) 8 MG disintegrating tablet Take 8 mg by mouth every 6 (six) hours as needed for nausea.    Marland Kitchen oxyCODONE-acetaminophen (PERCOCET) 10-325 MG per tablet Take 1 tablet by mouth every 8 (eight) hours as needed for pain. For pain    . potassium chloride SA (K-DUR,KLOR-CON) 20 MEQ tablet Take 40 mEq by mouth daily.  0  . pregabalin (LYRICA) 100 MG capsule Take 100 mg by mouth 3 (three) times  daily.    . vitamin C (ASCORBIC ACID) 500 MG tablet Take 500 mg by mouth 2 (two) times daily.    . VOLTAREN 1 % GEL Apply 2 g topically 4 (four) times daily as needed. For pain  0  . XARELTO 20 MG TABS tablet take 1 tablet by mouth once daily WITH SUPPER 30 tablet 6  . esomeprazole (NEXIUM) 40 MG capsule Take 1 capsule (40 mg total) by mouth daily. (Patient not taking: Reported on 06/11/2015) 30 capsule 3  . NEXIUM 40 MG capsule take 1 capsule by mouth twice a day WITH MEALS (Patient not taking: Reported on 05/24/2015) 60 capsule 0  . metFORMIN (GLUCOPHAGE) 500  MG tablet Take 1 tablet (500 mg total) by mouth 2 (two) times daily with a meal. (Patient not taking: Reported on 05/24/2015) 60 tablet 0   No facility-administered medications prior to visit.     PHYSICAL EXAM  Filed Vitals:   06/11/15 1416  BP: 104/76  Pulse: 74   There is no weight on file to calculate BMI.  Physical Exam  General:  in no acute distress. Well developed and groomed.  Neck: Neck is supple.  Cardiovascular: No carotid artery bruits. Heart is regular rate and rhythm with no murmurs.   Neurologic Exam  Mental Status: Patient is awake, but drowsy.  Language is fluent and comprehension intact.  Cranial Nerves: Pupils are equal and reactive to light. Visual fields are full to confrontation. Conjugate eye movements are full and symmetric. Facial sensation and strength are symmetric. Hearing is intact. Palate elevated symmetrically and uvula is midline. Shoulder shrug is symmetric. Tongue is midline.  Motor: Normal bulk and tone. Full strength in the upper and lower extremities. No pronator drift. TREMOR IN OUTSTRETCHED HANDS R>L. Sensory: DECR IN BLE IN STOCKING DISTRIBUTION.  Coordination: No ataxia or dysmetria on finger-nose or rapid alternating movement testing.  Gait and Station: ANTALGIC, UNSTEADY GAIT.  Reflexes: Deep tendon reflexes in the upper and lower extremity are TRACE and  symmetric.    DIAGNOSTICS  Lab Results  Component Value Date   WBC 3.7* 05/14/2015   HGB 11.8* 05/14/2015   HCT 36.1 05/14/2015   MCV 88.9 05/14/2015   PLT 140* 05/14/2015     Chemistry      Component Value Date/Time   NA 137 05/14/2015 1440   K 4.1 05/14/2015 1440   CL 104 05/14/2015 1440   CO2 24 05/14/2015 1440   BUN 10 05/14/2015 1440   CREATININE 1.14* 05/14/2015 1440   CREATININE 1.36* 05/02/2015 1149      Component Value Date/Time   CALCIUM 9.5 05/14/2015 1440   ALKPHOS 85 10/25/2014 1719   AST 24 10/25/2014 1719   ALT 17 10/25/2014 1719   BILITOT 0.4 10/25/2014 1719      10/17/12 MRI BRAIN - normal     ASSESSMENT AND PLAN  64 y.o. right-handed [place] female with multiple medical problems seen in our office for SEIZURE DISORDER. Had breakthrough sz in May 2015.   PLAN:  I spent 15 minutes of face to face time with patient. Greater than 50% of time was spent in counseling and coordination of care with patient. In summary we discussed:  - continue levetiracetam to XR 2000mg  at bedtime - no driving due to recurrent seizure and syncope  Meds ordered this encounter  Medications  . levETIRAcetam (KEPPRA XR) 500 MG 24 hr tablet    Sig: Take 4 tablets (2,000 mg total) by mouth at bedtime.    Dispense:  360 tablet    Refill:  4   Return in about 1 year (around 06/10/2016).    Suanne MarkerVIKRAM R. Jamelle Noy, MD 06/11/2015, 2:22 PM Certified in Neurology, Neurophysiology and Neuroimaging  [facility] [address], [address] [place], [place] [address] [phone]

## 2015-06-11 NOTE — Patient Instructions (Signed)
Thank you for coming to see [place] at [facility]. I hope we have been able to provide you high quality care today.  You may receive a patient satisfaction survey over the next few weeks. We would appreciate your feedback and comments so that we may continue to improve ourselves and the health of our patients.  - continue levetiracetam 2014m at bedtime - no driving for now   ~~~~~~~~~~~~~~~~~~~~~~~~~~~~~~~~~~~~~~~~~~~~~~~~~~~~~~~~~~~~~~~~~  DR. Terel Bann'S GUIDE TO HAPPY AND HEALTHY LIVING These are some of my general health and wellness recommendations. Some of them may apply to you better than others. Please use common sense as you try these suggestions and feel free to ask me any questions.   ACTIVITY/FITNESS Mental, social, emotional and physical stimulation are very important for brain and body health. Try learning a new activity (arts, music, language, sports, games).  Keep moving your body to the best of your abilities. You can do this at home, inside or outside, the park, community center, gym or anywhere you like. Consider a physical therapist or personal trainer to get started. Consider the app Sworkit. Fitness trackers such as smart-watches, smart-phones or Fitbits can help as well.   NUTRITION Eat more plants: colorful vegetables, nuts, seeds and berries.  Eat less sugar, salt, preservatives and processed foods.  Avoid toxins such as cigarettes and alcohol.  Drink water when you are thirsty. Warm water with a slice of lemon is an excellent morning drink to start the day.  Consider these websites for more information The Nutrition Source (hhttps://www.henry-hernandez.biz/ Precision Nutrition (wWindowBlog.ch   RELAXATION Consider practicing mindfulness meditation or other relaxation techniques such as deep breathing, prayer, yoga, tai chi, massage. See website mindful.org or the apps Headspace or Calm to help get  started.   SLEEP Try to get at least 7-8+ hours sleep per day. Regular exercise and reduced caffeine will help you sleep better. Practice good sleep hygeine techniques. See website sleep.org for more information.   PLANNING Prepare estate planning, living will, healthcare POA documents. Sometimes this is best planned with the help of an attorney. Theconversationproject.org and agingwithdignity.org are excellent resources.

## 2015-06-12 NOTE — Telephone Encounter (Signed)
Talked to patient on the 10/21. She states that she has severe nausea, and indicates that she has stopped her metformin.  She indicates her blood sugars have been improving without the metformin. Due to side effects, and limited benefit decided to stop metformin for now.

## 2015-06-13 ENCOUNTER — Telehealth: Payer: Self-pay | Admitting: *Deleted

## 2015-06-13 MED ORDER — GLUCOSE BLOOD VI STRP
ORAL_STRIP | Status: DC
Start: 2015-06-13 — End: 2015-08-22

## 2015-06-13 NOTE — Telephone Encounter (Signed)
Patient need a refill on Accu-Chek smartview test strips. Refill request form placed in provider box.  Test strips are not listed on medication list.  Clovis PuMartin, Jacqualyn Sedgwick L, RN

## 2015-06-20 ENCOUNTER — Ambulatory Visit
Admission: RE | Admit: 2015-06-20 | Discharge: 2015-06-20 | Disposition: A | Discharge status: Home / Self Care | Payer: Medicaid Other | Source: Ambulatory Visit

## 2015-06-20 DIAGNOSIS — Z1231 Encounter for screening mammogram for malignant neoplasm of breast: Secondary | ICD-10-CM

## 2015-06-21 ENCOUNTER — Ambulatory Visit (INDEPENDENT_AMBULATORY_CARE_PROVIDER_SITE_OTHER): Payer: Medicaid Other | Admitting: Internal Medicine

## 2015-06-21 ENCOUNTER — Encounter: Payer: Self-pay | Admitting: Internal Medicine

## 2015-06-21 VITALS — BP 130/82 | HR 64 | Temp 98.0°F | Ht 66.0 in | Wt 206.0 lb

## 2015-06-21 DIAGNOSIS — E118 Type 2 diabetes mellitus with unspecified complications: Secondary | ICD-10-CM | POA: Diagnosis not present

## 2015-06-21 DIAGNOSIS — E1165 Type 2 diabetes mellitus with hyperglycemia: Secondary | ICD-10-CM | POA: Diagnosis not present

## 2015-06-21 DIAGNOSIS — R400 Somnolence: Secondary | ICD-10-CM | POA: Diagnosis not present

## 2015-06-21 DIAGNOSIS — R079 Chest pain, unspecified: Secondary | ICD-10-CM

## 2015-06-21 DIAGNOSIS — IMO0002 Reserved for concepts with insufficient information to code with codable children: Secondary | ICD-10-CM

## 2015-06-21 DIAGNOSIS — Z794 Long term (current) use of insulin: Secondary | ICD-10-CM | POA: Diagnosis not present

## 2015-06-21 NOTE — Patient Instructions (Signed)
Please follow up in 1 month concern for Diabetes. Please don't eat for appointment

## 2015-06-21 NOTE — Assessment & Plan Note (Addendum)
DM HA1C 9.0, CBGs 150-170 - Endocrinologist Titrated up patients insulin to 30 units of Lantus (from 25 units) and Novolog to 14 units.  - Stopped Metformin due to patient with cramps - HA1C in 1 month

## 2015-06-21 NOTE — Progress Notes (Signed)
Patient ID: Toni RosenthalLinda F [name], female   DOB: Feb 12, 1951, 64 y.o.   MRN: [id number]   [name] Toni CharsAsiyah Nike Southwell, MD Phone: [phone]  Reason for Visit: Follow up for Diabetes, Patient concern for drowsiness   Drowsiness: Patient on several medications that would increase her sleepiness. However most recently was started on Latuda which she feels has been contributing to her sleepiness. This was started by her psychiatrist whom she will be meeting with on 7th for follow up. Will continue to follow however, will not stop this medication.   DM: Last A1C 9.0.  Blood sugars are between 150s- 170s. Patient being followed by Endocrinologist. Did not tolerate reintroduction of metformin. Patient received Lantus 30 units and Novolog 14 units. Patient denies any blurry vision, any polyuria, polydipsia.    Remote hx of sarcoidosis: Follow with pulm. Will be getting PFTs.   Chief complaint-noted No additions to family history Social history- patient is a non-smoker   Objective: BP 154/66 mmHg  Pulse 64  Temp(Src) 98 F (36.7 C) (Oral)  Ht 5\' 6"  (1.676 m)  Wt 206 lb (93.441 kg)  BMI 33.27 kg/m2 Gen: NAD, alert, cooperative with exam CV: RRR, good S1/S2, no murmur, cap refill <3 Resp: CTABL, no wheezes, non-labored Abd: SNTND, BS present, no guarding or organomegaly Ext: No edema, warm, normal tone, moves UE/LE spontaneously Neuro: Alert and oriented, No gross deficits Skin: no rashes no lesions  Assessment/Plan: See problem based a/p  Diabetes mellitus type 2, uncontrolled, with complications DM HA1C 9.0, CBGs 150-170 - Endocrinologist Titrated up patients insulin to 30 units of Lantus (from 25 units) and Novolog to 14 units.  - Stopped Metformin due to patient with cramps - HA1C in 1 month   Drowsy - Began after patient started taking [name]  - Following up with psychiatry in 4 days, will continue to follow

## 2015-06-21 NOTE — Assessment & Plan Note (Signed)
-   Began after patient started taking Latuda  - Following up with psychiatry in 4 days, will continue to follow

## 2015-06-24 ENCOUNTER — Ambulatory Visit (INDEPENDENT_AMBULATORY_CARE_PROVIDER_SITE_OTHER): Payer: Medicaid Other | Admitting: Family Medicine

## 2015-06-24 ENCOUNTER — Encounter: Payer: Self-pay | Admitting: Family Medicine

## 2015-06-24 VITALS — BP 141/84 | HR 71 | Temp 98.5°F | Wt 206.3 lb

## 2015-06-24 DIAGNOSIS — I10 Essential (primary) hypertension: Secondary | ICD-10-CM

## 2015-06-24 DIAGNOSIS — Z794 Long term (current) use of insulin: Secondary | ICD-10-CM | POA: Diagnosis not present

## 2015-06-24 DIAGNOSIS — N183 Chronic kidney disease, stage 3 unspecified: Secondary | ICD-10-CM

## 2015-06-24 DIAGNOSIS — D869 Sarcoidosis, unspecified: Secondary | ICD-10-CM | POA: Diagnosis not present

## 2015-06-24 DIAGNOSIS — E118 Type 2 diabetes mellitus with unspecified complications: Secondary | ICD-10-CM | POA: Diagnosis not present

## 2015-06-24 DIAGNOSIS — E1165 Type 2 diabetes mellitus with hyperglycemia: Secondary | ICD-10-CM | POA: Diagnosis not present

## 2015-06-24 DIAGNOSIS — F319 Bipolar disorder, unspecified: Secondary | ICD-10-CM | POA: Diagnosis not present

## 2015-06-24 DIAGNOSIS — IMO0002 Reserved for concepts with insufficient information to code with codable children: Secondary | ICD-10-CM

## 2015-06-24 NOTE — Patient Instructions (Signed)
Start using a small amount of your Voltaren gel on that area You can also try an over the counter medication- Arnica [place]  Try putting ice on the area to help with pain and swelling Come back in 2 week if the area is not better

## 2015-06-24 NOTE — Progress Notes (Signed)
   Subjective:   [name] is a 64 y.o. female with multiple complex medical problems.   Here for  Chief Complaint  Patient presents with  . Cyst    left hip x 3 days   #Left hip: tender area on the side of hip. Feels "like someone gave her a shot but [she] ain't got no shots." she reports she has not tried anything. Worse with movement.   Health Maintenance Due  Topic Date Due  . Hepatitis C Screening  Dec 17, 1950  . PAP SMEAR  02/24/1973   Review of Systems:   Per HPI. All other systems reviewed and are negative expect as in HPI   PMH, PSH, Medications, Allergies, SocialHx and FHx reviewed and updated in EMR- marked as reviewed 06/24/2015   Objective:  BP 141/84 mmHg  Pulse 71  Temp(Src) 98.5 F (36.9 C) (Oral)  Wt 206 lb 4.8 oz (93.577 kg)  Gen:  64 y.o. female in NAD. Speaking in full sentences. Good eye contact HEENT: NCAT, MMM CV: regular rate Resp: Normal WOB GI: Soft Ext: WWP MSK: Intact gait. Full ROM. <1 cm tender nodule on left lateral hip in IT band, not at insertion. Neuro: Alert and oriented, speech normal    Assessment:     [name] is a 64 y.o. female here for left hip tenderness.     Plan:   #Inflammatory nodule- likely to resolve in 1-2 weeks. Recommended using voltaren on area and discussed ice and arnica use. rtc if not improved.   Federico FlakeKimberly Niles Newton, MD, ABFM 06/24/2015  2:31 PM

## 2015-06-25 ENCOUNTER — Other Ambulatory Visit: Payer: Self-pay | Admitting: Internal Medicine

## 2015-06-25 DIAGNOSIS — R928 Other abnormal and inconclusive findings on diagnostic imaging of breast: Secondary | ICD-10-CM

## 2015-06-27 ENCOUNTER — Ambulatory Visit
Admission: RE | Admit: 2015-06-27 | Discharge: 2015-06-27 | Disposition: A | Discharge status: Home / Self Care | Payer: Medicaid Other | Source: Ambulatory Visit | Attending: Family Medicine | Admitting: Family Medicine

## 2015-06-27 DIAGNOSIS — R928 Other abnormal and inconclusive findings on diagnostic imaging of breast: Secondary | ICD-10-CM

## 2015-06-28 ENCOUNTER — Ambulatory Visit (HOSPITAL_BASED_OUTPATIENT_CLINIC_OR_DEPARTMENT_OTHER): Payer: Medicaid Other | Attending: Cardiovascular Disease | Admitting: Radiology

## 2015-06-28 VITALS — Ht 66.0 in | Wt 205.0 lb

## 2015-06-28 DIAGNOSIS — I493 Ventricular premature depolarization: Secondary | ICD-10-CM | POA: Diagnosis not present

## 2015-06-28 DIAGNOSIS — G4719 Other hypersomnia: Secondary | ICD-10-CM | POA: Insufficient documentation

## 2015-06-28 DIAGNOSIS — R400 Somnolence: Secondary | ICD-10-CM | POA: Diagnosis not present

## 2015-06-28 DIAGNOSIS — R0683 Snoring: Secondary | ICD-10-CM | POA: Insufficient documentation

## 2015-06-29 ENCOUNTER — Telehealth: Payer: Self-pay | Admitting: Cardiology

## 2015-06-29 NOTE — Sleep Study (Signed)
   Patient Name: [name], [name] MRN: [id number] Study Date: 06/28/2015 Gender: Female D.O.B: [birth year] Age (years): [age] Referring Provider: Charlton HawsPeter [name] Interpreting Physician: Toni Magicraci Toni Bloomfield MD, ABSM RPSGT: Toni PickupFord, Toni Weight (lbs): 205 Height (inches): 66 BMI: 33 Neck Size: 16.50  CLINICAL INFORMATION Sleep Study Type: NPSG Indication for sleep study: Snoring Epworth Sleepiness Score: 23  SLEEP STUDY TECHNIQUE As per the AASM Manual for the Scoring of Sleep and Associated Events v2.3 (April 2016) with a hypopnea requiring 4% desaturations. The channels recorded and monitored were frontal, central and occipital EEG, electrooculogram (EOG), submentalis EMG (chin), nasal and oral airflow, thoracic and abdominal wall motion, anterior tibialis EMG, snore microphone, electrocardiogram, and pulse oximetry.  MEDICATIONS Patient's medications include:Albuterol, Lipitor, Calcium, Colace, [name], Doxazosin, Cymbalta, Nexium, Lamictal, Latuda, Keppra, Claritin, Dulera, Nexium, Insulin, Zofran, Percocet, KCL, Xarelto, Lyrica. Medications self-administered by patient during sleep study : Sleep medicine administered - OXYCODONE at 10:00:08 PM  SLEEP ARCHITECTURE The study was initiated at 10:09:28 PM and ended at 5:09:54 AM. Sleep onset time was 70.5 minutes and the sleep efficiency was 70.2%. The total sleep time was 295.0 minutes. Stage REM latency was 62.0 minutes. The patient spent 12.71% of the night in stage N1 sleep, 83.39% in stage N2 sleep, 0.00% in stage N3 and 3.90% in REM. Alpha intrusion was absent. Supine sleep was 66.30%.  RESPIRATORY PARAMETERS The overall apnea/hypopnea index (AHI) was 0.0 per hour. There were 0 total apneas, including 0 obstructive, 0 central and 0 mixed apneas. There were 0 hypopneas and 0 RERAs. The AHI during Stage REM sleep was 0.0 per hour. AHI while supine was 0.0 per hour. The mean oxygen saturation was 94.65%. The minimum SpO2 during sleep was  92.00%. Soft snoring was noted during this study  CARDIAC DATA The 2 lead EKG demonstrated sinus rhythm. The mean heart rate was 67.90 beats per minute. Other EKG findings include: PVCs and PAC's.  LEG MOVEMENT DATA The total PLMS were 0 with a resulting PLMS index of 0.00. Associated arousal with leg movement index was 0.0 .  IMPRESSIONS - No significant obstructive sleep apnea occurred during this study (AHI = 0.0/h). - No significant central sleep apnea occurred during this study (CAI = 0.0/h). - The patient had minimal or no oxygen desaturation during the study (Min O2 = 92.00%) - The patient snored with Soft snoring volume. - EKG findings include PVCs. - Clinically significant periodic limb movements did not occur during sleep. No significant associated arousals  DIAGNOSIS - Excessive daytime sleepiness  RECOMMENDATIONS - Avoid alcohol, sedatives and other CNS depressants that may resuls in sleep apnea and disrupt normal sleep architecture. - Sleep hygiene should be reviewed to assess factors that may improve sleep quality. - Weight management and regular exercise should be initiated or continued if appropriate. - Recommend careful review of patient's medications.  She is on multiple medications that can lead to excessive sleepiness.  She has reduced REM sleep that is most likely secondary to SSRI therapy.  These drugs are known to suppress REM sleep.  Could consider MSLT to rule out narcolepsy but patient would need to be off her SSRI for at least 2 weeks.     Toni ReichertURNER,[name] Diplomate, American Board of Sleep Medicine  ELECTRONICALLY SIGNED ON:  06/29/2015, 1:51 PM [facility] PH: [phone]   FX: [phone] ACCREDITED BY THE AMERICAN ACADEMY OF SLEEP MEDICINE

## 2015-06-29 NOTE — Telephone Encounter (Signed)
Please let patient know that sleep study showed no significant sleep apnea.    

## 2015-07-01 ENCOUNTER — Telehealth: Payer: Self-pay | Admitting: *Deleted

## 2015-07-01 NOTE — Telephone Encounter (Signed)
Patient is aware of results. Stated verbal understanding 

## 2015-07-01 NOTE — Telephone Encounter (Signed)
[name] F >','<< Less Detail',event)" href="javascript:;">More Detail >>   Wendall Stade, MD   Sent: Sun June 30, 2015 12:12 AM    To: Alois Cliche, LPN        Message     No sleep apnea should talk to primary about changing/stopping SSRI    ----- Message -----     From: Quintella Reichert, MD     Sent: 06/29/2015  1:55 PM      To: Wendall Stade, MD                     Sleep Study   [name] (MR# [id number])         Sleep Study Info     Author Note Status Last Update User    Quintella Reichert, MD Signed Quintella Reichert, MD    Last Update Date/Time: 06/29/2015 1:53 PM        Sleep Study     Expand All Collapse All     Patient Name: [name], [name] MRN: [id number] Study Date: 06/28/2015 Gender: Female D.O.B: 01-May-1951 Age (years): 80 Referring Provider: Charlton Haws Interpreting Physician: Armanda Magic MD, ABSM RPSGT: Armen Pickup Weight (lbs): 205 Height (inches): 66 BMI: 33 Neck Size: 16.50  CLINICAL INFORMATION Sleep Study Type: NPSG Indication for sleep study: Snoring Epworth Sleepiness Score: 23  SLEEP STUDY TECHNIQUE As per the AASM Manual for the Scoring of Sleep and Associated Events v2.3 (April 2016) with a hypopnea requiring 4% desaturations. The channels recorded and monitored were frontal, central and occipital EEG, electrooculogram (EOG), submentalis EMG (chin), nasal and oral airflow, thoracic and abdominal wall motion, anterior tibialis EMG, snore microphone, electrocardiogram, and pulse oximetry.  MEDICATIONS Patient's medications include:Albuterol, Lipitor, Calcium, Colace, [name], Doxazosin, Cymbalta, Nexium, Lamictal, Latuda, Keppra, Claritin, Dulera, Nexium, Insulin, Zofran, Percocet, KCL, Xarelto, Lyrica. Medications self-administered by patient during sleep study : Sleep medicine administered - OXYCODONE at 10:00:08 PM  SLEEP  ARCHITECTURE The study was initiated at 10:09:28 PM and ended at 5:09:54 AM. Sleep onset time was 70.5 minutes and the sleep efficiency was 70.2%. The total sleep time was 295.0 minutes. Stage REM latency was 62.0 minutes. The patient spent 12.71% of the night in stage N1 sleep, 83.39% in stage N2 sleep, 0.00% in stage N3 and 3.90% in REM. Alpha intrusion was absent. Supine sleep was 66.30%.  RESPIRATORY PARAMETERS The overall apnea/hypopnea index (AHI) was 0.0 per hour. There were 0 total apneas, including 0 obstructive, 0 central and 0 mixed apneas. There were 0 hypopneas and 0 RERAs. The AHI during Stage REM sleep was 0.0 per hour. AHI while supine was 0.0 per hour. The mean oxygen saturation was 94.65%. The minimum SpO2 during sleep was 92.00%. Soft snoring was noted during this study  CARDIAC DATA The 2 lead EKG demonstrated sinus rhythm. The mean heart rate was 67.90 beats per minute. Other EKG findings include: PVCs and PAC's.  LEG MOVEMENT DATA The total PLMS were 0 with a resulting PLMS index of 0.00. Associated arousal with leg movement index was 0.0 .  IMPRESSIONS - No significant obstructive sleep apnea occurred during this study (AHI = 0.0/h). - No significant central sleep apnea occurred during this study (CAI = 0.0/h). - The patient had minimal or no oxygen desaturation during the study (Min O2 = 92.00%) - The patient snored with Soft snoring volume. - EKG findings include PVCs. - Clinically significant periodic limb movements did not occur during sleep. No significant  associated arousals  DIAGNOSIS - Excessive daytime sleepiness  RECOMMENDATIONS - Avoid alcohol, sedatives and other CNS depressants that may resuls in sleep apnea and disrupt normal sleep architecture. - Sleep hygiene should be reviewed to assess factors that may improve sleep quality. - Weight management and regular exercise should be initiated or continued if appropriate. - Recommend careful review  of patient's medications. She is on multiple medications that can lead to excessive sleepiness. She has reduced REM sleep that is most likely secondary to SSRI therapy. These drugs are known to suppress REM sleep. Could consider MSLT to rule out narcolepsy but patient would need to be off her SSRI for at least 2 weeks.    Quintella ReichertURNER,TRACI R Diplomate, American Board of Sleep Medicine  ELECTRONICALLY SIGNED ON: 06/29/2015, 1:51 PM [name] PH: [phone] FX: [phone] ACCREDITED BY THE AMERICAN ACADEMY OF SLEEP MEDICINE          PT  AWARE OF  SLEEP STUDY RESULTS .Zack Seal/CY

## 2015-07-09 ENCOUNTER — Ambulatory Visit (INDEPENDENT_AMBULATORY_CARE_PROVIDER_SITE_OTHER): Payer: Medicaid Other | Admitting: Internal Medicine

## 2015-07-09 ENCOUNTER — Encounter: Payer: Self-pay | Admitting: Internal Medicine

## 2015-07-09 VITALS — BP 114/76 | HR 82 | Ht 67.0 in | Wt 205.0 lb

## 2015-07-09 DIAGNOSIS — D869 Sarcoidosis, unspecified: Secondary | ICD-10-CM

## 2015-07-09 DIAGNOSIS — J45991 Cough variant asthma: Secondary | ICD-10-CM | POA: Diagnosis not present

## 2015-07-09 DIAGNOSIS — J4531 Mild persistent asthma with (acute) exacerbation: Secondary | ICD-10-CM

## 2015-07-09 LAB — PULMONARY FUNCTION TEST
DL/VA % PRED: 101 %
DL/VA: 5.25 ml/min/mmHg/L
DLCO UNC % PRED: 70 %
DLCO unc: 20.02 ml/min/mmHg
FEF 25-75 PRE: 2.36 L/s
FEF 25-75 Post: 2.66 L/sec
FEF2575-%CHANGE-POST: 12 %
FEF2575-%Pred-Post: 123 %
FEF2575-%Pred-Pre: 109 %
FEV1-%Change-Post: 1 %
FEV1-%PRED-PRE: 94 %
FEV1-%Pred-Post: 96 %
FEV1-POST: 2.19 L
FEV1-Pre: 2.16 L
FEV1FVC-%Change-Post: 4 %
FEV1FVC-%Pred-Pre: 102 %
FEV6-%CHANGE-POST: -3 %
FEV6-%PRED-POST: 91 %
FEV6-%PRED-PRE: 94 %
FEV6-POST: 2.57 L
FEV6-PRE: 2.67 L
FEV6FVC-%CHANGE-POST: 0 %
FEV6FVC-%PRED-POST: 103 %
FEV6FVC-%PRED-PRE: 102 %
FVC-%CHANGE-POST: -2 %
FVC-%PRED-POST: 89 %
FVC-%Pred-Pre: 91 %
FVC-Post: 2.6 L
FVC-Pre: 2.68 L
POST FEV6/FVC RATIO: 100 %
Post FEV1/FVC ratio: 84 %
Pre FEV1/FVC ratio: 81 %
Pre FEV6/FVC Ratio: 100 %

## 2015-07-09 MED ORDER — AMOXICILLIN-POT CLAVULANATE 875-125 MG PO TABS: 1.0000 | ORAL_TABLET | Freq: Two times a day (BID) | ORAL | Status: DC

## 2015-07-09 MED ORDER — MOMETASONE FURO-FORMOTEROL FUM 100-5 MCG/ACT IN AERO: INHALATION_SPRAY | RESPIRATORY_TRACT | Status: DC

## 2015-07-09 NOTE — Progress Notes (Signed)
PFT done today. 

## 2015-07-09 NOTE — Progress Notes (Signed)
Subjective:    Patient ID: [name], female    DOB: [birth year]    MRN: [id number]  Brief patient profile:  55  yobf never smoker clinical dx with sarcoid here 1995 neg tbbx but classic cxr which gradually improved and  Off maint prednisone since around 2011  referred   to pulmonary clinic by Dr Christella Hartigan for cough.    History of Present Illness  05/24/2015 Acute OV  Last seen in office in 2013 .  Pt present for an acute office visit. Complains of chest tightness, dyspnea at times, and a dry cough. Sinus pressure and drainage, nausea at times x 1 week. 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  Augmentin  Twice daily  For 7 days  Mucinex DM Twice daily  As needed  Cough/congestion  Fluids and rest     07/09/2015  f/u ov/Teryn Gust re: cough  Chief Complaint  Patient presents with  . Follow-up    PFT done today. Pt c/o increased cough for the past 3 days- prod with yellow sputum. She also c/o increased DOE- gets winded walking approx 15 steps.   cough stopped p 7 days of augmentin then started back x 4 days prior to OV  When lies down / tends to keep her up  Dulera 200 started back up 2 weeks prior to OV  Did not help cough or breathing  No obvious day to day or daytime variability or assoc   cp or chest tightness, subjective wheeze or overt sinus or hb symptoms. No unusual exp hx or h/o childhood pna/ asthma or knowledge of premature birth.  Sleeping ok without nocturnal  or early am exacerbation  of respiratory  c/o's or need for noct saba. Also denies any obvious fluctuation of symptoms with weather or environmental changes or other aggravating or alleviating factors except as outlined above   Current Medications, Allergies, Complete Past Medical History, Past Surgical History, Family History, and Social History were reviewed in [employer] record.  ROS   The following are not active complaints unless bolded sore throat, dysphagia, dental problems, itching, sneezing,  nasal congestion or excess/ purulent secretions, ear ache,   fever, chills, sweats, unintended wt loss, classically pleuritic or exertional cp, hemoptysis,  orthopnea pnd or leg swelling, presyncope, palpitations, abdominal pain, anorexia, nausea, vomiting, diarrhea  or change in bowel or bladder habits, change in stools or urine, dysuria,hematuria,  rash, arthralgias, visual complaints, headache, numbness, weakness or ataxia or problems with walking or coordination,  change in mood/affect or memory.            Objective:   Physical Exam  Obese amb bf nad   Wt Readings from Last 3 Encounters:  07/09/15 205 lb (92.987 kg)  06/28/15 205 lb (92.987 kg)  06/24/15 206 lb 4.8 oz (93.577 kg)    Vital signs reviewed    HEENT: nl dentition, turbinates, and orophanx. Nl external ear canals without cough reflex   NECK :  without JVD/Nodes/TM/ nl carotid upstrokes bilaterally   LUNGS: Coarse BS w/ no wheezing    CV:  RRR  no s3 or murmur or increase in P2, no edema   ABD:  soft and nontender with nl excursion in the supine position. No bruits or organomegaly, bowel sounds nl  MS:  warm without deformities, calf tenderness, cyanosis or clubbing  SKIN: warm and dry without lesions  NEURO:  alert, approp, no deficits      I personally reviewed images and agree with radiology impression as follows:  CXR:   05/14/15 1. No active cardiopulmonary disease. 2. Stable symmetric mild hilar prominence from mild bilateral hilar adenopathy as seen on 08/02/2014 chest CT angiogram, likely due to known sarcoidosis.     Assessment & Plan:

## 2015-07-09 NOTE — Patient Instructions (Addendum)
Nexium Take 30- 60 min before your first and last meals of the day .  GERD (REFLUX)  is an extremely common cause of respiratory symptoms just like yours , many times with no obvious heartburn at all.    It can be treated with medication, but also with lifestyle changes including elevation of the head of your bed (ideally with 6 inch  bed blocks),  Smoking cessation, avoidance of late meals, excessive alcohol, and avoid fatty foods, chocolate, peppermint, colas, red wine, and acidic juices such as orange juice.  NO MINT OR MENTHOL PRODUCTS SO NO COUGH DROPS  USE SUGARLESS CANDY INSTEAD (Jolley ranchers or Stover's or Life Savers) or even ice chips will also do - the key is to swallow to prevent all throat clearing. NO OIL BASED VITAMINS - use powdered substitutes.   Change dulera to 100 Take 2 puffs first thing in am and then another 2 puffs about 12 hours later   Work on inhaler technique:  relax and gently blow all the way out then take a nice smooth deep breath back in, triggering the inhaler at same time you start breathing in.  Hold for up to 5 seconds if you can. Blow out thru nose. Rinse and gargle with water when done  Augmentin 875 mg take one pill twice daily  X 10 days - take at breakfast and supper with large glass of water.  It would help reduce the usual side effects (diarrhea and yeast infections) if you ate cultured yogurt at lunch.    Please schedule a follow up visit in 3 months but call sooner if needed

## 2015-07-11 ENCOUNTER — Encounter: Payer: Self-pay | Admitting: Internal Medicine

## 2015-07-11 NOTE — Assessment & Plan Note (Signed)
Complicated by hbp/ dm/ hyperlipidemia   Body mass index is 32.1    Lab Results  Component Value Date   TSH 0.374 09/08/2014     Contributing to gerd tendency/ doe/reviewed the need and the process to achieve and maintain neg calorie balance > defer f/u primary care including intermittently monitoring thyroid status

## 2015-07-11 NOTE — Assessment & Plan Note (Signed)
-   Neg tbbx 1995  - no maint pred since 2011  - PFT's wnl 07/09/2015   No evidence at all of active sarcoid here

## 2015-07-11 NOTE — Assessment & Plan Note (Signed)
-   Nl spirometry while symptomatic 09/23/2001    - Trial off flovent rec 09/24/2011 due to ? psuedoasthma -med calendar 10/09/2011 > did not follow  DDX of  difficult airways management all start with A and  include Adherence, Ace Inhibitors, Acid Reflux, Active Sinus Disease, Alpha 1 Antitripsin deficiency, Anxiety masquerading as Airways dz,  ABPA,  allergy(esp in young), Aspiration (esp in elderly), Adverse effects of meds,  Active smokers, A bunch of PE's (a small clot burden can't cause this syndrome unless there is already severe underlying pulm or vascular dz with poor reserve) plus two Bs  = Bronchiectasis and Beta blocker use..and one C= CHF  Adherence is always the initial "prime suspect" and is a multilayered concern that requires a "trust but verify" approach in every patient - starting with knowing how to use medications, especially inhalers, correctly, keeping up with refills and understanding the fundamental difference between maintenance and prns vs those medications only taken for a very short course and then stopped and not refilled.  - The proper method of use, as well as anticipated side effects, of a metered-dose inhaler are discussed and demonstrated to the patient. Improved effectiveness after extensive coaching during this visit to a level of approximately  75% from a baseline of 50% so try reduce the dulera to 100 2bid as the higher doses may actually cause/ aggravate any upper airway component to her cough  ? Acid (or non-acid) GERD > always difficult to exclude as up to 75% of pts in some series report no assoc GI/ Heartburn symptoms> rec continue max (24h)  acid suppression and diet restrictions/ reviewed     ? Active sinus dz suggested by resp to augmentin > try 10 days rx and then sinus ct next step   ? Anxiety > usually at the bottom of this list of usual suspects but should be much higher on this pt's based on H and P and note already on psychotropics .   ? A bunch of pe's  > already on NOAC   I had an extended discussion with the patient reviewing all relevant studies completed to date and  lasting 15 to 20 minutes of a 25 minute visit    Each maintenance medication was reviewed in detail including most importantly the difference between maintenance and prns and under what circumstances the prns are to be triggered using an action plan format that is not reflected in the computer generated alphabetically organized AVS.    Please see instructions for details which were reviewed in writing and the patient given a copy highlighting the part that I personally wrote and discussed at today's ov.

## 2015-07-26 ENCOUNTER — Encounter: Payer: Self-pay | Admitting: Internal Medicine

## 2015-07-26 ENCOUNTER — Ambulatory Visit (INDEPENDENT_AMBULATORY_CARE_PROVIDER_SITE_OTHER): Payer: Medicaid Other | Admitting: Internal Medicine

## 2015-07-26 VITALS — BP 131/71 | HR 67 | Temp 97.9°F | Wt 205.0 lb

## 2015-07-26 DIAGNOSIS — F319 Bipolar disorder, unspecified: Secondary | ICD-10-CM | POA: Diagnosis not present

## 2015-07-26 DIAGNOSIS — IMO0002 Reserved for concepts with insufficient information to code with codable children: Secondary | ICD-10-CM

## 2015-07-26 DIAGNOSIS — I10 Essential (primary) hypertension: Secondary | ICD-10-CM | POA: Diagnosis not present

## 2015-07-26 DIAGNOSIS — Z794 Long term (current) use of insulin: Secondary | ICD-10-CM

## 2015-07-26 DIAGNOSIS — E1165 Type 2 diabetes mellitus with hyperglycemia: Secondary | ICD-10-CM | POA: Diagnosis not present

## 2015-07-26 DIAGNOSIS — Z1159 Encounter for screening for other viral diseases: Secondary | ICD-10-CM | POA: Diagnosis not present

## 2015-07-26 DIAGNOSIS — E118 Type 2 diabetes mellitus with unspecified complications: Secondary | ICD-10-CM | POA: Diagnosis present

## 2015-07-26 DIAGNOSIS — E785 Hyperlipidemia, unspecified: Secondary | ICD-10-CM | POA: Diagnosis not present

## 2015-07-26 DIAGNOSIS — N63 Unspecified lump in unspecified breast: Secondary | ICD-10-CM | POA: Insufficient documentation

## 2015-07-26 LAB — POCT GLYCOSYLATED HEMOGLOBIN (HGB A1C): HEMOGLOBIN A1C: 9.6

## 2015-07-26 NOTE — Assessment & Plan Note (Addendum)
Diabetes managed by Endocrinology. HA1C 9.7. BS Max 306, Min 117, Avg 147  - Lantus 30 units and Novolog 14 units BID  - Will continue to follow Endocrinology recs

## 2015-07-26 NOTE — Progress Notes (Signed)
Patient ID: [name], female   DOB: Jan 03, 1951, 64 y.o.   MRN: [id number]   [name] [name], MD Phone: [phone]  Reason For Visit: Diabetes follow up and Breast nodule   # Breast Nodule: Felt a tingling in breast and noted a nodule. Noticed the nodule on Tuesday. Patient states that sometimes it is painful. No discharge, retraction of nipple or tissue. Recent mammogram   HYPERTENSION Disease Monitoring: Blood pressure range- SBP 120s, DBP, 72-80  Chest pain, palpitations- Patient has palpations, has hx of atrial fibrillation.      Dyspnea- Patient has been SOB, checked out pulmonologist most recently for Medications: Not on any medications, has been off medications since January  No Lightheadedness,Syncope or Edema  DIABETES, Managed by Endocrinology  Disease Monitoring: HA1C 9.0  Blood Sugar ranges-  BS Max 306, Min 117, Avg 147  Polyuria/phagia/dipsia- Indicates polydipsia      Visual problems- Recent eye exam no diabetic retionpathy, early glucoma/cataracts, needs glasses  Medications: Lantus 30 units, Novolog 10 BID  Compliance- No issues Hypoglycemic symptoms- No  Episodes on hypoglycemia   HYPERLIPIDEMIA Disease Monitoring: See symptoms for Hypertension Medications: Lipitor  Compliance- No issues with medication. Right upper quadrant pain-  No issues Muscle aches- She does have muscle aches   Monitoring Labs and Parameters Last A1C:  Lab Results  Component Value Date   HGBA1C 9.6 07/26/2015    Last Lipid:     Component Value Date/Time   CHOL * 09/08/2010 0515    247        ATP III CLASSIFICATION:  <200     mg/dL   Desirable  829-562  mg/dL   Borderline High  >=130    mg/dL   High          HDL 50 09/08/2010 0515    Last Bmet  POTASSIUM  Date Value Ref Range Status  05/14/2015 4.1 3.5 - 5.1 mmol/L Final   SODIUM  Date Value Ref Range Status  05/14/2015 137 135 - 145 mmol/L Final   CREAT  Date Value Ref Range  Status  05/02/2015 1.36* 0.50 - 0.99 mg/dL Final   CREATININE, SER  Date Value Ref Range Status  05/14/2015 1.14* 0.44 - 1.00 mg/dL Final      Last BPs:  BP Readings from Last 3 Encounters:  07/26/15 131/71  07/09/15 114/76  06/24/15 141/84    Past Medical History Reviewed problem list.  Medications- reviewed and updated No additions to family history   Objective: BP 131/71 mmHg  Pulse 67  Temp(Src) 97.9 F (36.6 C) (Oral)  Wt 205 lb (92.987 kg) Gen: NAD, alert, cooperative with exam CV: RRR, good S1/S2, no murmur, cap refill <3 Resp: CTABL, no wheezes, non-labored Breast Exam: Right breast, 6'o clock nodule at under the nipple, no nodular findings in left breast, no discharge noted, no axillary lymphadenopathy.  Abd: SNTND, BS present, no guarding or organomegaly  Assessment/Plan: See problem based a/p   Essential hypertension Elevated BP 160/80s -> 130s after recheck - Patient not on any blood pressure medications overall with good control via Dash diet.    Diabetes mellitus type 2, uncontrolled, with complications Diabetes managed by Endocrinology. HA1C 9.7. BS Max 306, Min 117, Avg 147  - Lantus 30 units and Novolog 14 units BID  - Will continue to follow Endocrinology recs   Breast nodule Right Breast Nodule at 6 o clock near the nipple. Recent mammogram on 11/28 showing fibroglandular tissues in the  right breast compared to the left breast. Patient notes pain with this nodule. Recent noticed 4 days ago  - Reassured patient likely fibroglandular tissue  - Discussed return precautions increasing size, retraction of tissue, discharge- return for reevaluation.   Hyperlipidemia - Continue Lipitor  - Recheck Lipids, continue to follow

## 2015-07-26 NOTE — Patient Instructions (Addendum)
We are going to check lipids and HA1C today. We will continue to monitor. If breast nodule increase in size, discharge. Please come back in to be re-evaluated.

## 2015-07-26 NOTE — Assessment & Plan Note (Signed)
Right Breast Nodule at 6 o clock near the nipple. Recent mammogram on 11/28 showing fibroglandular tissues in the right breast compared to the left breast. Patient notes pain with this nodule. Recent noticed 4 days ago  - Reassured patient likely fibroglandular tissue  - Discussed return precautions increasing size, retraction of tissue, discharge- return for reevaluation.

## 2015-07-26 NOTE — Assessment & Plan Note (Signed)
-   Continue Lipitor  - Recheck Lipids, continue to follow

## 2015-07-26 NOTE — Assessment & Plan Note (Addendum)
Elevated BP 160/80s -> 130s after recheck - Patient not on any blood pressure medications overall with good control via Dash diet.

## 2015-07-27 LAB — HEPATITIS C ANTIBODY: HCV AB: NEGATIVE

## 2015-07-27 LAB — LIPID PANEL
Cholesterol: 159 mg/dL (ref 125–200)
HDL: 37 mg/dL — AB (ref >=46)
Total CHOL/HDL Ratio: 4.3 Ratio (ref <=5.0)
Triglycerides: 530 mg/dL — ABNORMAL HIGH (ref <150)

## 2015-07-30 ENCOUNTER — Telehealth: Payer: Self-pay | Admitting: Internal Medicine

## 2015-07-30 NOTE — Telephone Encounter (Signed)
Pt called back to inform the doctor of the name of the medication that the therapist prescribed. She is taking 10 MG of Abilify once a day. jw

## 2015-08-07 ENCOUNTER — Telehealth: Payer: Self-pay | Admitting: Internal Medicine

## 2015-08-07 MED ORDER — FLUCONAZOLE 100 MG PO TABS: 100.0000 mg | ORAL_TABLET | Freq: Every day | ORAL | Status: DC

## 2015-08-07 NOTE — Telephone Encounter (Signed)
Spoke with pt. States that she has developed a yeast infection from the antibiotic [place]. Advised her that this medication should be out her system and would not cause a yeast infection a month later. She still wants to know if MW would send in medication to treat this.  MW - please advise. Thanks.

## 2015-08-07 NOTE — Telephone Encounter (Signed)
Pt is aware of MW's recommendation. Rx has been sent in. Nothing further was needed. 

## 2015-08-07 NOTE — Telephone Encounter (Signed)
Ok to rx diflucan 100 mg x one pill should do if not should check with her gyn or primary

## 2015-08-22 ENCOUNTER — Encounter (HOSPITAL_COMMUNITY): Payer: Self-pay | Admitting: Neurology

## 2015-08-22 ENCOUNTER — Other Ambulatory Visit: Payer: Self-pay | Admitting: Cardiovascular Disease

## 2015-08-22 ENCOUNTER — Emergency Department (HOSPITAL_COMMUNITY): Payer: Medicaid Other

## 2015-08-22 ENCOUNTER — Observation Stay (HOSPITAL_COMMUNITY)
Admission: EM | Admit: 2015-08-22 | Discharge: 2015-08-23 | Disposition: A | Discharge status: Home / Self Care | Payer: Medicaid Other | Attending: Cardiovascular Disease | Admitting: Cardiovascular Disease

## 2015-08-22 DIAGNOSIS — R0981 Nasal congestion: Secondary | ICD-10-CM | POA: Diagnosis not present

## 2015-08-22 DIAGNOSIS — F419 Anxiety disorder, unspecified: Secondary | ICD-10-CM | POA: Diagnosis not present

## 2015-08-22 DIAGNOSIS — K635 Polyp of colon: Secondary | ICD-10-CM | POA: Insufficient documentation

## 2015-08-22 DIAGNOSIS — N183 Chronic kidney disease, stage 3 unspecified: Secondary | ICD-10-CM

## 2015-08-22 DIAGNOSIS — I129 Hypertensive chronic kidney disease with stage 1 through stage 4 chronic kidney disease, or unspecified chronic kidney disease: Secondary | ICD-10-CM | POA: Insufficient documentation

## 2015-08-22 DIAGNOSIS — K589 Irritable bowel syndrome without diarrhea: Secondary | ICD-10-CM | POA: Diagnosis not present

## 2015-08-22 DIAGNOSIS — D860 Sarcoidosis of lung: Secondary | ICD-10-CM | POA: Diagnosis not present

## 2015-08-22 DIAGNOSIS — R110 Nausea: Secondary | ICD-10-CM | POA: Diagnosis not present

## 2015-08-22 DIAGNOSIS — G40909 Epilepsy, unspecified, not intractable, without status epilepticus: Secondary | ICD-10-CM | POA: Insufficient documentation

## 2015-08-22 DIAGNOSIS — D72819 Decreased white blood cell count, unspecified: Secondary | ICD-10-CM

## 2015-08-22 DIAGNOSIS — G8929 Other chronic pain: Secondary | ICD-10-CM | POA: Insufficient documentation

## 2015-08-22 DIAGNOSIS — Z79899 Other long term (current) drug therapy: Secondary | ICD-10-CM | POA: Diagnosis not present

## 2015-08-22 DIAGNOSIS — K8020 Calculus of gallbladder without cholecystitis without obstruction: Secondary | ICD-10-CM | POA: Insufficient documentation

## 2015-08-22 DIAGNOSIS — I4891 Unspecified atrial fibrillation: Secondary | ICD-10-CM | POA: Diagnosis not present

## 2015-08-22 DIAGNOSIS — R109 Unspecified abdominal pain: Secondary | ICD-10-CM | POA: Diagnosis not present

## 2015-08-22 DIAGNOSIS — G629 Polyneuropathy, unspecified: Secondary | ICD-10-CM | POA: Diagnosis not present

## 2015-08-22 DIAGNOSIS — M797 Fibromyalgia: Secondary | ICD-10-CM | POA: Diagnosis not present

## 2015-08-22 DIAGNOSIS — E119 Type 2 diabetes mellitus without complications: Secondary | ICD-10-CM | POA: Diagnosis not present

## 2015-08-22 DIAGNOSIS — R0602 Shortness of breath: Secondary | ICD-10-CM | POA: Insufficient documentation

## 2015-08-22 DIAGNOSIS — J45909 Unspecified asthma, uncomplicated: Secondary | ICD-10-CM | POA: Insufficient documentation

## 2015-08-22 DIAGNOSIS — G43909 Migraine, unspecified, not intractable, without status migrainosus: Secondary | ICD-10-CM | POA: Diagnosis not present

## 2015-08-22 DIAGNOSIS — M1990 Unspecified osteoarthritis, unspecified site: Secondary | ICD-10-CM | POA: Insufficient documentation

## 2015-08-22 DIAGNOSIS — K5790 Diverticulosis of intestine, part unspecified, without perforation or abscess without bleeding: Secondary | ICD-10-CM | POA: Diagnosis not present

## 2015-08-22 DIAGNOSIS — R079 Chest pain, unspecified: Principal | ICD-10-CM | POA: Insufficient documentation

## 2015-08-22 DIAGNOSIS — E785 Hyperlipidemia, unspecified: Secondary | ICD-10-CM | POA: Diagnosis not present

## 2015-08-22 DIAGNOSIS — I10 Essential (primary) hypertension: Secondary | ICD-10-CM | POA: Diagnosis present

## 2015-08-22 DIAGNOSIS — I480 Paroxysmal atrial fibrillation: Secondary | ICD-10-CM | POA: Diagnosis not present

## 2015-08-22 DIAGNOSIS — R42 Dizziness and giddiness: Secondary | ICD-10-CM | POA: Diagnosis not present

## 2015-08-22 HISTORY — DX: Chronic kidney disease, stage 3 (moderate): N18.3

## 2015-08-22 HISTORY — DX: Chronic kidney disease, stage 3 unspecified: N18.30

## 2015-08-22 LAB — COMPREHENSIVE METABOLIC PANEL
ALT: 21 U/L (ref 14–54)
ANION GAP: 12 (ref 5–15)
AST: 26 U/L (ref 15–41)
Albumin: 4.1 g/dL (ref 3.5–5.0)
Alkaline Phosphatase: 108 U/L (ref 38–126)
BUN: 14 mg/dL (ref 6–20)
CHLORIDE: 102 mmol/L (ref 101–111)
CO2: 25 mmol/L (ref 22–32)
Calcium: 9.5 mg/dL (ref 8.9–10.3)
Creatinine, Ser: 1.33 mg/dL — ABNORMAL HIGH (ref 0.44–1.00)
GFR calc non Af Amer: 42 mL/min — ABNORMAL LOW (ref >60)
GFR, EST AFRICAN AMERICAN: 48 mL/min — AB (ref >60)
Glucose, Bld: 279 mg/dL — ABNORMAL HIGH (ref 65–99)
POTASSIUM: 4.7 mmol/L (ref 3.5–5.1)
SODIUM: 139 mmol/L (ref 135–145)
Total Bilirubin: 0.2 mg/dL — ABNORMAL LOW (ref 0.3–1.2)
Total Protein: 7.7 g/dL (ref 6.5–8.1)

## 2015-08-22 LAB — URINALYSIS, ROUTINE W REFLEX MICROSCOPIC
Bilirubin Urine: NEGATIVE
Glucose, UA: 250 mg/dL — AB
Hgb urine dipstick: NEGATIVE
Ketones, ur: NEGATIVE mg/dL
LEUKOCYTES UA: NEGATIVE
NITRITE: NEGATIVE
PROTEIN: NEGATIVE mg/dL
SPECIFIC GRAVITY, URINE: 1.019 (ref 1.005–1.030)
pH: 5 (ref 5.0–8.0)

## 2015-08-22 LAB — LIPASE, BLOOD: Lipase: 54 U/L — ABNORMAL HIGH (ref 11–51)

## 2015-08-22 LAB — I-STAT TROPONIN, ED
TROPONIN I, POC: 0 ng/mL (ref 0.00–0.08)
TROPONIN I, POC: 0 ng/mL (ref 0.00–0.08)

## 2015-08-22 LAB — CBC
HEMATOCRIT: 39.1 % (ref 36.0–46.0)
Hemoglobin: 12.4 g/dL (ref 12.0–15.0)
MCH: 28.1 pg (ref 26.0–34.0)
MCHC: 31.7 g/dL (ref 30.0–36.0)
MCV: 88.7 fL (ref 78.0–100.0)
PLATELETS: 153 10*3/uL (ref 150–400)
RBC: 4.41 MIL/uL (ref 3.87–5.11)
RDW: 12.6 % (ref 11.5–15.5)
WBC: 3.1 10*3/uL — AB (ref 4.0–10.5)

## 2015-08-22 LAB — MAGNESIUM: MAGNESIUM: 1.8 mg/dL (ref 1.7–2.4)

## 2015-08-22 LAB — T4, FREE: Free T4: 0.8 ng/dL (ref 0.61–1.12)

## 2015-08-22 LAB — TSH: TSH: 2.486 u[IU]/mL (ref 0.350–4.500)

## 2015-08-22 LAB — GLUCOSE, CAPILLARY: GLUCOSE-CAPILLARY: 280 mg/dL — AB (ref 65–99)

## 2015-08-22 LAB — TROPONIN I

## 2015-08-22 MED ORDER — POTASSIUM CHLORIDE CRYS ER 20 MEQ PO TBCR
40.0000 meq | EXTENDED_RELEASE_TABLET | Freq: Every day | ORAL | Status: DC
  Administered 2015-08-23: 40 meq via ORAL
  Filled 2015-08-22: qty 2

## 2015-08-22 MED ORDER — ATORVASTATIN CALCIUM 40 MG PO TABS
40.0000 mg | ORAL_TABLET | Freq: Every day | ORAL | Status: DC
  Administered 2015-08-22: 40 mg via ORAL
  Filled 2015-08-22: qty 1

## 2015-08-22 MED ORDER — DILTIAZEM HCL 100 MG IV SOLR
5.0000 mg/h | INTRAVENOUS | Status: DC
  Administered 2015-08-22: 5 mg/h via INTRAVENOUS
  Filled 2015-08-22: qty 100

## 2015-08-22 MED ORDER — FERROUS SULFATE 325 (65 FE) MG PO TABS
325.0000 mg | ORAL_TABLET | Freq: Every day | ORAL | Status: DC
  Administered 2015-08-23: 325 mg via ORAL
  Filled 2015-08-22: qty 1

## 2015-08-22 MED ORDER — SODIUM CHLORIDE 0.9 % IV SOLN: 250.0000 mL | INTRAVENOUS | Status: DC

## 2015-08-22 MED ORDER — DOXAZOSIN MESYLATE 4 MG PO TABS
4.0000 mg | ORAL_TABLET | Freq: Every day | ORAL | Status: DC
  Administered 2015-08-22 – 2015-08-23 (×2): 4 mg via ORAL
  Filled 2015-08-22 (×2): qty 1

## 2015-08-22 MED ORDER — INSULIN ASPART 100 UNIT/ML ~~LOC~~ SOLN
0.0000 [IU] | Freq: Three times a day (TID) | SUBCUTANEOUS | Status: DC
  Administered 2015-08-23: 3 [IU] via SUBCUTANEOUS
  Administered 2015-08-23: 2 [IU] via SUBCUTANEOUS

## 2015-08-22 MED ORDER — OXYCODONE-ACETAMINOPHEN 5-325 MG PO TABS
1.0000 | ORAL_TABLET | Freq: Three times a day (TID) | ORAL | Status: DC | PRN
  Administered 2015-08-22 – 2015-08-23 (×2): 1 via ORAL
  Filled 2015-08-22 (×2): qty 1

## 2015-08-22 MED ORDER — SODIUM CHLORIDE 0.9 % IJ SOLN: 3.0000 mL | INTRAMUSCULAR | Status: DC | PRN

## 2015-08-22 MED ORDER — SODIUM CHLORIDE 0.9 % IJ SOLN
3.0000 mL | Freq: Two times a day (BID) | INTRAMUSCULAR | Status: DC
  Administered 2015-08-22 – 2015-08-23 (×2): 3 mL via INTRAVENOUS

## 2015-08-22 MED ORDER — SODIUM CHLORIDE 0.9 % IJ SOLN: 3.0000 mL | Freq: Two times a day (BID) | INTRAMUSCULAR | Status: DC

## 2015-08-22 MED ORDER — MAGNESIUM SULFATE 2 GM/50ML IV SOLN
2.0000 g | Freq: Once | INTRAVENOUS | Status: AC
Start: 2015-08-22 — End: 2015-08-23
  Administered 2015-08-22: 2 g via INTRAVENOUS
  Filled 2015-08-22: qty 50

## 2015-08-22 MED ORDER — PREGABALIN 50 MG PO CAPS
100.0000 mg | ORAL_CAPSULE | Freq: Three times a day (TID) | ORAL | Status: DC
  Administered 2015-08-22 – 2015-08-23 (×3): 100 mg via ORAL
  Filled 2015-08-22 (×3): qty 2

## 2015-08-22 MED ORDER — OXYCODONE-ACETAMINOPHEN 10-325 MG PO TABS: 1.0000 | ORAL_TABLET | Freq: Three times a day (TID) | ORAL | Status: DC | PRN

## 2015-08-22 MED ORDER — MOMETASONE FURO-FORMOTEROL FUM 100-5 MCG/ACT IN AERO
2.0000 | INHALATION_SPRAY | Freq: Two times a day (BID) | RESPIRATORY_TRACT | Status: DC
  Administered 2015-08-22 – 2015-08-23 (×2): 2 via RESPIRATORY_TRACT
  Filled 2015-08-22: qty 8.8

## 2015-08-22 MED ORDER — CALCIUM CARBONATE 1250 (500 CA) MG PO TABS
600.0000 mg | ORAL_TABLET | Freq: Every day | ORAL | Status: DC
  Administered 2015-08-22 – 2015-08-23 (×2): 625 mg via ORAL
  Filled 2015-08-22 (×2): qty 1

## 2015-08-22 MED ORDER — OXYCODONE HCL 5 MG PO TABS
5.0000 mg | ORAL_TABLET | Freq: Three times a day (TID) | ORAL | Status: DC | PRN
  Administered 2015-08-22: 5 mg via ORAL
  Filled 2015-08-22: qty 1

## 2015-08-22 MED ORDER — MORPHINE SULFATE (PF) 4 MG/ML IV SOLN
4.0000 mg | Freq: Once | INTRAVENOUS | Status: AC
Start: 2015-08-22 — End: 2015-08-22
  Administered 2015-08-22: 4 mg via INTRAVENOUS
  Filled 2015-08-22: qty 1

## 2015-08-22 MED ORDER — LAMOTRIGINE 100 MG PO TABS
100.0000 mg | ORAL_TABLET | Freq: Every day | ORAL | Status: DC
  Administered 2015-08-22: 100 mg via ORAL
  Filled 2015-08-22: qty 1

## 2015-08-22 MED ORDER — SODIUM CHLORIDE 0.9 % IV SOLN: 250.0000 mL | INTRAVENOUS | Status: DC | PRN

## 2015-08-22 MED ORDER — LORATADINE 10 MG PO TABS
10.0000 mg | ORAL_TABLET | Freq: Every morning | ORAL | Status: DC
  Administered 2015-08-23: 10 mg via ORAL
  Filled 2015-08-22: qty 1

## 2015-08-22 MED ORDER — RIVAROXABAN 20 MG PO TABS
20.0000 mg | ORAL_TABLET | Freq: Every day | ORAL | Status: DC
  Administered 2015-08-22 – 2015-08-23 (×2): 20 mg via ORAL
  Filled 2015-08-22 (×2): qty 1

## 2015-08-22 MED ORDER — INSULIN GLARGINE 100 UNIT/ML SOLOSTAR PEN: 25.0000 [IU] | PEN_INJECTOR | Freq: Every day | SUBCUTANEOUS | Status: DC

## 2015-08-22 MED ORDER — MORPHINE SULFATE (PF) 4 MG/ML IV SOLN
4.0000 mg | Freq: Once | INTRAVENOUS | Status: AC
  Administered 2015-08-22: 4 mg via INTRAVENOUS
  Filled 2015-08-22: qty 1

## 2015-08-22 MED ORDER — INSULIN GLARGINE 100 UNIT/ML ~~LOC~~ SOLN
12.0000 [IU] | Freq: Every day | SUBCUTANEOUS | Status: AC
  Administered 2015-08-22: 12 [IU] via SUBCUTANEOUS
  Filled 2015-08-22: qty 0.12

## 2015-08-22 MED ORDER — PANTOPRAZOLE SODIUM 40 MG PO TBEC
80.0000 mg | DELAYED_RELEASE_TABLET | Freq: Every day | ORAL | Status: DC
  Administered 2015-08-23: 80 mg via ORAL
  Filled 2015-08-22: qty 2

## 2015-08-22 MED ORDER — INSULIN GLARGINE 100 UNIT/ML ~~LOC~~ SOLN
25.0000 [IU] | Freq: Every day | SUBCUTANEOUS | Status: DC
  Filled 2015-08-22: qty 0.25

## 2015-08-22 MED ORDER — ALBUTEROL SULFATE HFA 108 (90 BASE) MCG/ACT IN AERS: 2.0000 | INHALATION_SPRAY | Freq: Four times a day (QID) | RESPIRATORY_TRACT | Status: DC | PRN

## 2015-08-22 MED ORDER — OXCARBAZEPINE 300 MG PO TABS
300.0000 mg | ORAL_TABLET | Freq: Two times a day (BID) | ORAL | Status: DC
  Administered 2015-08-22 – 2015-08-23 (×2): 300 mg via ORAL
  Filled 2015-08-22 (×3): qty 1

## 2015-08-22 MED ORDER — ALBUTEROL SULFATE (2.5 MG/3ML) 0.083% IN NEBU: 2.5000 mg | INHALATION_SOLUTION | Freq: Four times a day (QID) | RESPIRATORY_TRACT | Status: DC | PRN

## 2015-08-22 MED ORDER — LEVETIRACETAM ER 500 MG PO TB24
2000.0000 mg | ORAL_TABLET | Freq: Every day | ORAL | Status: DC
  Administered 2015-08-22: 2000 mg via ORAL
  Filled 2015-08-22 (×2): qty 4

## 2015-08-22 MED ORDER — DOFETILIDE 250 MCG PO CAPS
250.0000 ug | ORAL_CAPSULE | Freq: Two times a day (BID) | ORAL | Status: DC
  Administered 2015-08-22 – 2015-08-23 (×2): 250 ug via ORAL
  Filled 2015-08-22 (×2): qty 1

## 2015-08-22 MED ORDER — DOCUSATE SODIUM 100 MG PO CAPS: 100.0000 mg | ORAL_CAPSULE | Freq: Two times a day (BID) | ORAL | Status: DC | PRN

## 2015-08-22 MED ORDER — VITAMIN C 500 MG PO TABS
500.0000 mg | ORAL_TABLET | Freq: Two times a day (BID) | ORAL | Status: DC
  Administered 2015-08-22 – 2015-08-23 (×2): 500 mg via ORAL
  Filled 2015-08-22 (×2): qty 1

## 2015-08-22 MED ORDER — DILTIAZEM HCL 100 MG IV SOLR: 5.0000 mg/h | INTRAVENOUS | Status: DC

## 2015-08-22 NOTE — Progress Notes (Signed)
Med rec not yet fully complete by pharmacy tech. Asked pharmacy to please complete med rec and page PA on call to verify home doses of Abilify, Keppra, Lamictal, Cymbalta, and to let us know of any discrepancies of home meds that have already been ordered.  I also ordered 1/2 dose basal insulin tonight because she'll be NPO after midnight. Will resume full dose lantus in AM. Ronie Spiesayna Dunn PA-C

## 2015-08-22 NOTE — Progress Notes (Signed)
Per pharmacy med rec, patient is taking Keppra, Lamictal, Lyrica, trileptal and abilify. Not sure why she is on so many medications. She does have h/o serizure, will continue Trileptal, Lamictal, Keppra. Will continue lyrica for her depression and anxiety. Hesitant to order abilify on top of everything else. Need to monitor QTc  Signed, Azalee CourseHao Forbes Loll PA Pager: [phone]

## 2015-08-22 NOTE — ED Notes (Signed)
Pt reports for last several days has had cough, URI symptoms. This morning went she stood up she felt dizzy, developed CP this morning that is cramping in middle chest and left side. Worse when taking a deep breath.

## 2015-08-22 NOTE — Progress Notes (Signed)
Magnesium level is 1.8.  Dr. Tresa EndoKelly notified.  Order received for IV mag sulfate 2gm.  Will continue to monitor.  Alonza Bogusuvall, Romuald Mccaslin Gray

## 2015-08-22 NOTE — ED Notes (Signed)
Attempted report x 2 

## 2015-08-22 NOTE — ED Notes (Signed)
Pt in AFIB, HR 123

## 2015-08-22 NOTE — Progress Notes (Signed)
Upon reviewing tele strips, pt was found to have converted to NSR at 1930 today.  EKG confirms NSR.  VSS.  Pt in NAD.  Dr. Leeann MustJacob Kelly notified of the above.  Cardizem gtt stopped per Dr. Landry DykeKelly's order.  Will continue to monitor.  [name], [name]

## 2015-08-22 NOTE — ED Notes (Signed)
Attempted report x1. 

## 2015-08-22 NOTE — ED Notes (Signed)
Ordered heart healthy meal tray for pt. 

## 2015-08-22 NOTE — Consult Note (Signed)
History and Physical  Patient ID: [name] MRN: [id number], DOB: [birth year] Date of Encounter: 08/22/2015, 4:02 PM Primary Physician: Toni Maiers, MD Primary Cardiologist: Dr. Eden [name]  Chief Complaint: chest pain, dizziness Reason for Admission: recurrent atrial fib  Requesting MD: Ms. Toni Buffalo, PA-C  HPI:[name] is 65 y/o F with history of PAF on [name]/Xarelto, CKD stage III, normal coronaries in 2012 (normal nuc 09/2014), sarcoidosis of lung, asthma, HTN, HLD, DM, GERD, migraine, seizures, peripheral neuropathy who presented to [facility] today with cough/URI symptoms, dizziness, and chest discomfort. She was found to be back in atrial fib.  Her last recurrence was in 08/2014 in the setting of GI illness while on [name], which was continued at that time. She had spontaneously converted on diltiazem drip. She had a negative stress test in follow-up due to minimally elevated troponins which was normal with EF 65%.   She presented to [facility] today with complaints of weakness and dizziness. She reports she's had a sinus infection ever since November. She says she has been tried on 2 courses of antibiotics but symptoms seem to keep coming back up. She said her pulmonologist planned to do a CT of her sinuses if it recurred. She hasn't felt great since New Year's Eve. She has been taking Mucinex but denies any decongestant use. Today while riding in the car with her sister, she developed a sensation of dizziness, weakness, and chest "muscle spasms" so came to the ER for evaluation. She was found to be in AF RVR rates 120s. She's not completely sure but she wonders if she has been back in AF for a couple days. She reports the chest discomfort is worse with subtle movements in bed and palpation of her chest wall. No SOB, fevers, nausea, LEE, orthopnea. In the ER she is afebrile. Labwork notable for Cr 1.33 (similar to prior), WBC 3.1 (appears chronic), troponin 0.00. CXR without active  disease. Diltiazem drip was started with improvement in HR to 80s-90s. She reports ongoing "nagging" chest discomfort that was partially relieved by morphine 4mg  x 2 but has been fairly constant all day long. She reports compliance with meds except missed this AM's [name] dose due to coming to the hospital.  Past Medical History  Diagnosis Date  . Essential hypertension   . Hyperlipidemia   . Colon polyps   . Diverticulosis   . Gallstones   . IBS (irritable bowel syndrome)   . Kidney stones   . Paroxysmal a-fib (HCC)     a. On [name]. b. Recurrence 08/2014 in setting of GI illness.  . Depression   . Asthma     Dr. Sherene [name]  . Sarcoidosis of lung (HCC)     Dr. Sherene [name]  . Fibromyalgia   . Peripheral neuropathy (HCC)   . Type II diabetes mellitus (HCC)   . H/O hiatal hernia   . GERD (gastroesophageal reflux disease)   . Migraine   . Epilepsy (HCC)   . Arthritis   . Chronic back pain   . Anxiety   . History of cardiac catheterization     a. Normal coronaries 2012. b. Normal nuc 09/2014.  . Seizures (HCC)   . CKD (chronic kidney disease), stage III     Surgical History:  Past Surgical History  Procedure Laterality Date  . Closed reduction ankle fracture Left 10/2006  . Resection of hand neuroma Left 01/2002  . Neuroplasty / transposition median nerve at carpal tunnel Left 2004  . Esophageal manometry  03/21/2012    Procedure: ESOPHAGEAL MANOMETRY (EM);  Surgeon: Toni Fee, MD;  Location: [facility];  Service: Endoscopy;  Laterality: N/A;  . Colon surgery    . Anterior cervical decomp/discectomy fusion  07/11/2012    Procedure: ANTERIOR CERVICAL DECOMPRESSION/DISCECTOMY FUSION 1 LEVEL;  Surgeon: Toni Loron, MD;  Location: [facility];  Service: Neurosurgery;  Laterality: N/A;  Cervical Five-Six Anterior Cervical Decompression with Fusion Interbody Prothesis Plating and Bonegraft  . Cholecystectomy  1976  . Neuroplasty / transposition median nerve at carpal tunnel Right  2002  . Abdominal hysterectomy  1995  . Salpingoophorectomy Bilateral 2000  . Dilation and curettage of uterus    . Tubal ligation  1982  . Fracture surgery    . Cardiac catheterization  2012     Home Meds: Prior to Admission medications   Medication Sig Start Date End Date Taking? Authorizing Provider  albuterol (PROVENTIL HFA;VENTOLIN HFA) 108 (90 BASE) MCG/ACT inhaler Inhale 2 puffs into the lungs every 6 (six) hours as needed for wheezing or shortness of breath. 05/27/15   Asiyah Mayra Reel, MD  amoxicillin-clavulanate (AUGMENTIN) 875-125 MG tablet Take 1 tablet by mouth 2 (two) times daily. 07/09/15   Nyoka Cowden, MD  ARIPiprazole (ABILIFY) 10 MG tablet Take 10 mg by mouth daily.    Historical Provider, MD  atorvastatin (LIPITOR) 40 MG tablet Take 40 mg by mouth daily. 04/07/15   Historical Provider, MD  calcium carbonate (OS-CAL) 600 MG TABS Take 1 tablet (600 mg total) by mouth daily. 10/19/11   Tammy S Parrett, NP  cholecalciferol (VITAMIN D) 1000 UNITS tablet Take 1,000 Units by mouth 2 (two) times daily.     Historical Provider, MD  Cranberry 500 MG CAPS Take 500 mg by mouth 2 (two) times daily.    Historical Provider, MD  docusate sodium (COLACE) 100 MG capsule Take 100 mg by mouth 2 (two) times daily as needed for constipation. For stool softner    Historical Provider, MD  dofetilide ([name]) 250 MCG capsule Take 1 capsule (250 mcg total) by mouth 2 (two) times daily. 10/26/14   Wendall Stade, MD  doxazosin (CARDURA) 4 MG tablet Take 4 mg by mouth daily.    Historical Provider, MD  DULoxetine (CYMBALTA) 60 MG capsule Take 60 mg by mouth daily.    Historical Provider, MD  esomeprazole (NEXIUM) 40 MG capsule Take 1 capsule (40 mg total) by mouth daily. 05/02/15   Asiyah Mayra Reel, MD  ferrous sulfate 325 (65 FE) MG tablet Take 325 mg by mouth daily with breakfast.    Historical Provider, MD  fluconazole (DIFLUCAN) 100 MG tablet Take 1 tablet (100 mg total) by mouth daily.  08/07/15   Nyoka Cowden, MD  glucose blood test strip Use as instructed 06/13/15   Asiyah Mayra Reel, MD  Insulin Glargine (LANTUS SOLOSTAR) 100 UNIT/ML Solostar Pen Inject 25 Units into the skin at bedtime. Patient taking differently: Inject 30 Units into the skin at bedtime.  09/11/14   Ripudeep Jenna Luo, MD  lamoTRIgine (LAMICTAL) 25 MG tablet 4 at bedtime 05/21/15   Historical Provider, MD  levETIRAcetam (KEPPRA XR) 500 MG 24 hr tablet Take 4 tablets (2,000 mg total) by mouth at bedtime. 06/11/15   Suanne Marker, MD  loratadine (CLARITIN) 10 MG tablet Take 10 mg by mouth every morning.    Historical Provider, MD  mometasone-formoterol (DULERA) 100-5 MCG/ACT AERO Take 2 puffs first thing in am and then another 2 puffs  about 12 hours later. 07/09/15   Nyoka Cowden, MD  nitroGLYCERIN (NITROSTAT) 0.4 MG SL tablet Place 0.4 mg under the tongue every 5 (five) minutes as needed for chest pain (do nto exceed 3 doses).     Historical Provider, MD  NOVOLOG FLEXPEN 100 UNIT/ML FlexPen Inject 10 Units as directed 2 (two) times daily.  03/29/15   Historical Provider, MD  ondansetron (ZOFRAN-ODT) 8 MG disintegrating tablet Take 8 mg by mouth every 6 (six) hours as needed for nausea.    Historical Provider, MD  oxyCODONE-acetaminophen (PERCOCET) 10-325 MG per tablet Take 1 tablet by mouth every 8 (eight) hours as needed for pain. For pain 08/04/14   Vassie Loll, MD  potassium chloride SA (K-DUR,KLOR-CON) 20 MEQ tablet Take 40 mEq by mouth daily. 02/07/15   Historical Provider, MD  pregabalin (LYRICA) 100 MG capsule Take 100 mg by mouth 3 (three) times daily.    Historical Provider, MD  vitamin C (ASCORBIC ACID) 500 MG tablet Take 500 mg by mouth 2 (two) times daily.    Historical Provider, MD  VOLTAREN 1 % GEL Apply 2 g topically 4 (four) times daily as needed. For pain 08/13/14   Historical Provider, MD  XARELTO 20 MG TABS tablet TAKE 1 TABLET BY MOUTH ONCE DAILY WITH SUPPER 08/22/15   Wendall Stade, MD      Allergies:  Allergies  Allergen Reactions  . Prednisone     SENT INTO AFIB   . Amitriptyline Other (See Comments)    Disoriented.  . Hydromorphone Hcl Other (See Comments)    blood pressure drops.    Social History   Social History  . Marital Status: Divorced    Spouse Name: N/A  . Number of Children: 3  . Years of Education: 12th   Occupational History  . disabled     CNA   Social History Main Topics  . Smoking status: Never Smoker   . Smokeless tobacco: Never Used  . Alcohol Use: No  . Drug Use: No  . Sexual Activity: Not Currently   Other Topics Concern  . Not on file   Social History Narrative   Pt. Lives at home with her daughter. She is single, has three children, does not work currently, and has a 12th grade education level. She has never used tobacco or alcohol. Quit using illicit drugs at the age of 64 and rarely ha caffeine.     Family History  Problem Relation Age of Onset  . Heart attack Mother     @ age 59  . Mental illness Mother   . Diabetes Mother   . Hypertension Mother     siblings  . Alzheimer's disease Mother   . Depression Mother   . Hyperlipidemia Mother   . Heart attack Brother 50  . Alcohol abuse Brother   . Depression Brother   . Diabetes Brother   . Hyperlipidemia Brother   . Hypertension Brother   . Kidney disease Brother   . Drug abuse Brother   . Colon cancer Maternal Aunt   . Prostate cancer Maternal Grandfather   . Diabetes Maternal Grandfather   . Hyperlipidemia Maternal Grandfather   . Ovarian cancer Maternal Aunt   . Diabetes    . Hypertension    . Lupus Sister   . Alcohol abuse Sister   . Depression Sister   . Diabetes Sister   . Hyperlipidemia Sister   . Hypertension Sister   . Kidney disease Sister   .  Drug abuse Sister   . Ovarian cancer Cousin   . Alcohol abuse Father   . Heart attack Father   . Hyperlipidemia Father   . Hypertension Father   . Diabetes Maternal Grandmother   . Hyperlipidemia  Maternal Grandmother     Review of Systems: All other systems reviewed and are otherwise negative except as noted above.  Labs:   Lab Results  Component Value Date   WBC 3.1* 08/22/2015   HGB 12.4 08/22/2015   HCT 39.1 08/22/2015   MCV 88.7 08/22/2015   PLT 153 08/22/2015    Recent Labs Lab 08/22/15 1115  NA 139  K 4.7  CL 102  CO2 25  BUN 14  CREATININE 1.33*  CALCIUM 9.5  PROT 7.7  BILITOT 0.2*  ALKPHOS 108  ALT 21  AST 26  GLUCOSE 279*    Radiology/Studies:  Dg Chest 2 View  08/22/2015  CLINICAL DATA:  Chest pain.  Atrial fibrillation.  Sarcoidosis. EXAM: CHEST  2 VIEW COMPARISON:  05/14/2015 FINDINGS: The heart size and mediastinal contours are within normal limits. Both lungs are clear. The visualized skeletal structures are unremarkable. Mild hilar adenopathy better seen on prior chest CT 08/02/2014 IMPRESSION: No active cardiopulmonary disease. Electronically Signed   By: Marlan Palauharles  Clark M.D.   On: 08/22/2015 12:41   Wt Readings from Last 3 Encounters:  07/26/15 205 lb (92.987 kg)  07/09/15 205 lb (92.987 kg)  06/28/15 205 lb (92.987 kg)    EKG:  1) atrial fib 123bpm, low voltage, left axis deviation, no acute changes 2) atrial fib 97bpm, low voltage,left axis deviation, no acute changes  Physical Exam: Blood pressure 128/89, pulse 96, resp. rate 15, SpO2 97 %. General: Well developed, well nourished AAF, in no acute distress. Head: Normocephalic, atraumatic, sclera non-icteric, no xanthomas, nares are without discharge. Sounds congested. Neck: Negative for carotid bruits. JVD not elevated. Lungs: Clear bilaterally to auscultation without wheezes, rales, or rhonchi. Breathing is unlabored. Heart: Irregularly irregular, rate controlled. No murmurs, rubs, or gallops appreciated. Chest wall tender to palpation. Abdomen: Soft, non-tender, non-distended with normoactive bowel sounds. No hepatomegaly. No rebound/guarding. No obvious abdominal masses. Msk:   Strength and tone appear normal for age. Extremities: No clubbing or cyanosis. No edema.  Distal pedal pulses are 2+ and equal bilaterally. Neuro: Alert and oriented X 3. No focal deficit. No facial asymmetry. Moves all extremities spontaneously. Psych:  Responds to questions appropriately with a normal affect.    ASSESSMENT AND PLAN:   1. Recurrent PAF - she historically does not feel good when in atrial fibrillation. Will admit and continue IV diltiazem. She reports full compliance with Xarelto with no missed doses. Will plan for DCCV in AM if she does not convert. This is scheduled for tomorrow at 12pm with Dr. Royann Shiversroitoru. If she has another recurrence of atrial fib, will need f/u with EP to discuss alternative antiarrhythmic choices. Continue current regimen otherwise.  2. Chest pain, atypical - persistent for most of the day with negative troponins thus far. Continue to cycle. Suspect musculoskeletal.   3. Recently chronic sinusitis - does not appear acutely toxic. Afebrile, normal WBC. This could be contributing to her sensation of dizziness. She will need to f/u pulm/PCP for this.  4. Essential HTN - controlled.  5. CKD stage III - Cr near baseline.  6. Leukopenia - persistent over several labs. Will need f/u PCP.  Signed, Laurann Montanaayna N Dunn PA-C 08/22/2015, 4:02 PM Pager: [phone]  Attending Note:   The  patient was seen and examined.  Agree with assessment and plan as noted above.  Changes made to the above note as needed.  Has gone back into atrial fib Has not missed any doses of Xarelto Has some URI symptoms but no wheezing on exam. - I dont think this is contributing to her atrial fib  Will continue the Cardizem drip for now If she does not convert, will cardiovert her in the am. She clearly is symptomatic when she has rapid atrial fib.  Feels better on the Dilt drip    Vesta Mixer, Montez Hageman., MD, [facility] 08/22/2015, 4:35 PM [address],  [address] Office [phone] Pager [phone]

## 2015-08-22 NOTE — ED Provider Notes (Signed)
CSN: [id number]     Arrival date & time 08/22/15  1103 History   First MD Initiated Contact with Patient 08/22/15 1118     Chief Complaint  Patient presents with  . Chest Pain  . URI    HPI   [name] is a 65 y.o. female with a PMH of atrial fibrillation on tikosyn and xarelto, HTN, HLD, IBS, DM, GERD, migraine, depression, anxiety, seizures who presents to the ED with chest pain. She reports 1 week of nasal congestion and productive cough, and states today, she was in the car with her sister on her way to the store when she started to feel lightheaded, dizzy, and developed central chest pain. She states her pain is constant. She denies exacerbating factors. She has not tried anything for symptom relief. She also reports chills, abdominal pain, and nausea. She denies fever, vomiting, diarrhea, constipation, dysuria, urgency, frequency, numbness, weakness, paresthesia.   Past Medical History  Diagnosis Date  . Essential hypertension   . Hyperlipidemia   . Colon polyps   . Diverticulosis   . Gallstones   . IBS (irritable bowel syndrome)   . Kidney stones   . Paroxysmal a-fib (HCC)     a. On [name]. b. Recurrence 08/2014 in setting of GI illness.  . Depression   . Asthma     Dr. [name]  . Sarcoidosis of lung (HCC)     Dr. [name]  . Fibromyalgia   . Peripheral neuropathy (HCC)   . Type II diabetes mellitus (HCC)   . H/O hiatal hernia   . GERD (gastroesophageal reflux disease)   . Migraine   . Epilepsy (HCC)   . Arthritis   . Chronic back pain   . Anxiety   . History of cardiac catheterization     a. Normal coronaries 2012. b. Normal nuc 09/2014.  . Seizures (HCC)   . CKD (chronic kidney disease), stage III    Past Surgical History  Procedure Laterality Date  . Closed reduction ankle fracture Left 10/2006  . Resection of hand neuroma Left 01/2002  . Neuroplasty / transposition median nerve at carpal tunnel Left 2004  . Esophageal manometry  03/21/2012    Procedure: ESOPHAGEAL  MANOMETRY (EM);  Surgeon: [name], MD;  Location: [facility];  Service: Endoscopy;  Laterality: N/A;  . Colon surgery    . Anterior cervical decomp/discectomy fusion  07/11/2012    Procedure: ANTERIOR CERVICAL DECOMPRESSION/DISCECTOMY FUSION 1 LEVEL;  Surgeon: [name], MD;  Location: [facility];  Service: Neurosurgery;  Laterality: N/A;  Cervical Five-Six Anterior Cervical Decompression with Fusion Interbody Prothesis Plating and Bonegraft  . Cholecystectomy  1976  . Neuroplasty / transposition median nerve at carpal tunnel Right 2002  . Abdominal hysterectomy  1995  . Salpingoophorectomy Bilateral 2000  . Dilation and curettage of uterus    . Tubal ligation  1982  . Fracture surgery    . Cardiac catheterization  2012   Family History  Problem Relation Age of Onset  . Heart attack Mother     @ age 41  . Mental illness Mother   . Diabetes Mother   . Hypertension Mother     siblings  . Alzheimer's disease Mother   . Depression Mother   . Hyperlipidemia Mother   . Heart attack Brother 50  . Alcohol abuse Brother   . Depression Brother   . Diabetes Brother   . Hyperlipidemia Brother   . Hypertension Brother   .  Kidney disease Brother   . Drug abuse Brother   . Colon cancer Maternal Aunt   . Prostate cancer Maternal Grandfather   . Diabetes Maternal Grandfather   . Hyperlipidemia Maternal Grandfather   . Ovarian cancer Maternal Aunt   . Diabetes    . Hypertension    . Lupus Sister   . Alcohol abuse Sister   . Depression Sister   . Diabetes Sister   . Hyperlipidemia Sister   . Hypertension Sister   . Kidney disease Sister   . Drug abuse Sister   . Ovarian cancer Cousin   . Alcohol abuse Father   . Heart attack Father   . Hyperlipidemia Father   . Hypertension Father   . Diabetes Maternal Grandmother   . Hyperlipidemia Maternal Grandmother    Social History  Substance Use Topics  . Smoking status: Never Smoker   . Smokeless tobacco: Never Used   . Alcohol Use: No   OB History    No data available      Review of Systems  Constitutional: Positive for chills. Negative for fever.  HENT: Positive for congestion.   Respiratory: Positive for cough and shortness of breath.   Cardiovascular: Positive for chest pain.  Gastrointestinal: Positive for nausea and abdominal pain. Negative for vomiting, diarrhea and constipation.  Genitourinary: Negative for dysuria, urgency and frequency.  Neurological: Positive for dizziness and light-headedness. Negative for syncope, weakness and numbness.  All other systems reviewed and are negative.     Allergies  Prednisone; Amitriptyline; and Hydromorphone hcl  Home Medications   Prior to Admission medications   Medication Sig Start Date End Date Taking? Authorizing Provider  albuterol (PROVENTIL HFA;VENTOLIN HFA) 108 (90 BASE) MCG/ACT inhaler Inhale 2 puffs into the lungs every 6 (six) hours as needed for wheezing or shortness of breath. 05/27/15   Asiyah Mayra Reel, MD  amoxicillin-clavulanate (AUGMENTIN) 875-125 MG tablet Take 1 tablet by mouth 2 (two) times daily. 07/09/15   Nyoka Cowden, MD  ARIPiprazole (ABILIFY) 10 MG tablet Take 10 mg by mouth daily.    Historical Provider, MD  atorvastatin (LIPITOR) 40 MG tablet Take 40 mg by mouth daily. 04/07/15   Historical Provider, MD  calcium carbonate (OS-CAL) 600 MG TABS Take 1 tablet (600 mg total) by mouth daily. 10/19/11   Tammy S Parrett, NP  cholecalciferol (VITAMIN D) 1000 UNITS tablet Take 1,000 Units by mouth 2 (two) times daily.     Historical Provider, MD  Cranberry 500 MG CAPS Take 500 mg by mouth 2 (two) times daily.    Historical Provider, MD  docusate sodium (COLACE) 100 MG capsule Take 100 mg by mouth 2 (two) times daily as needed for constipation. For stool softner    Historical Provider, MD  dofetilide ([name]) 250 MCG capsule Take 1 capsule (250 mcg total) by mouth 2 (two) times daily. 10/26/14   Wendall Stade, MD  doxazosin  (CARDURA) 4 MG tablet Take 4 mg by mouth daily.    Historical Provider, MD  DULoxetine (CYMBALTA) 60 MG capsule Take 60 mg by mouth daily.    Historical Provider, MD  esomeprazole (NEXIUM) 40 MG capsule Take 1 capsule (40 mg total) by mouth daily. 05/02/15   Asiyah Mayra Reel, MD  ferrous sulfate 325 (65 FE) MG tablet Take 325 mg by mouth daily with breakfast.    Historical Provider, MD  fluconazole (DIFLUCAN) 100 MG tablet Take 1 tablet (100 mg total) by mouth daily. 08/07/15   Nyoka Cowden,  MD  glucose blood test strip Use as instructed 06/13/15   Asiyah Mayra Reel, MD  Insulin Glargine (LANTUS SOLOSTAR) 100 UNIT/ML Solostar Pen Inject 25 Units into the skin at bedtime. Patient taking differently: Inject 30 Units into the skin at bedtime.  09/11/14   Ripudeep Jenna Luo, MD  lamoTRIgine (LAMICTAL) 25 MG tablet 4 at bedtime 05/21/15   Historical Provider, MD  levETIRAcetam (KEPPRA XR) 500 MG 24 hr tablet Take 4 tablets (2,000 mg total) by mouth at bedtime. 06/11/15   Suanne Marker, MD  loratadine (CLARITIN) 10 MG tablet Take 10 mg by mouth every morning.    Historical Provider, MD  mometasone-formoterol (DULERA) 100-5 MCG/ACT AERO Take 2 puffs first thing in am and then another 2 puffs about 12 hours later. 07/09/15   Nyoka Cowden, MD  nitroGLYCERIN (NITROSTAT) 0.4 MG SL tablet Place 0.4 mg under the tongue every 5 (five) minutes as needed for chest pain (do nto exceed 3 doses).     Historical Provider, MD  NOVOLOG FLEXPEN 100 UNIT/ML FlexPen Inject 10 Units as directed 2 (two) times daily.  03/29/15   Historical Provider, MD  ondansetron (ZOFRAN-ODT) 8 MG disintegrating tablet Take 8 mg by mouth every 6 (six) hours as needed for nausea.    Historical Provider, MD  oxyCODONE-acetaminophen (PERCOCET) 10-325 MG per tablet Take 1 tablet by mouth every 8 (eight) hours as needed for pain. For pain 08/04/14   Vassie Loll, MD  potassium chloride SA (K-DUR,KLOR-CON) 20 MEQ tablet Take 40 mEq by mouth  daily. 02/07/15   Historical Provider, MD  pregabalin (LYRICA) 100 MG capsule Take 100 mg by mouth 3 (three) times daily.    Historical Provider, MD  vitamin C (ASCORBIC ACID) 500 MG tablet Take 500 mg by mouth 2 (two) times daily.    Historical Provider, MD  VOLTAREN 1 % GEL Apply 2 g topically 4 (four) times daily as needed. For pain 08/13/14   Historical Provider, MD  XARELTO 20 MG TABS tablet TAKE 1 TABLET BY MOUTH ONCE DAILY WITH SUPPER 08/22/15   Wendall Stade, MD    BP 120/82 mmHg  Pulse 75  Temp(Src)   Resp 9  SpO2 96% Physical Exam  Constitutional: She is oriented to person, place, and time. She appears well-developed and well-nourished. No distress.  HENT:  Head: Normocephalic and atraumatic.  Right Ear: External ear normal.  Left Ear: External ear normal.  Nose: Nose normal.  Mouth/Throat: Uvula is midline, oropharynx is clear and moist and mucous membranes are normal.  Eyes: Conjunctivae, EOM and lids are normal. Pupils are equal, round, and reactive to light. Right eye exhibits no discharge. Left eye exhibits no discharge. No scleral icterus.  Neck: Normal range of motion. Neck supple.  Cardiovascular: Normal heart sounds, intact distal pulses and normal pulses.  An irregularly irregular rhythm present. Tachycardia present.   Pulmonary/Chest: Effort normal and breath sounds normal. No respiratory distress. She has no wheezes. She has no rales.  Abdominal: Soft. Normal appearance and bowel sounds are normal. She exhibits no distension and no mass. There is tenderness. There is no rigidity, no rebound and no guarding.  Mild TTP in LUQ. No rebound, guarding, or masses.  Musculoskeletal: Normal range of motion. She exhibits no edema or tenderness.  Neurological: She is alert and oriented to person, place, and time. She has normal strength. No sensory deficit.  Skin: Skin is warm, dry and intact. No rash noted. She is not diaphoretic. No erythema. No  pallor.  Psychiatric: She has a  normal mood and affect. Her speech is normal and behavior is normal.  Nursing note and vitals reviewed.   ED Course  Procedures (including critical care time)  Labs Review Labs Reviewed  CBC - Abnormal; Notable for the following:    WBC 3.1 (*)    All other components within normal limits  COMPREHENSIVE METABOLIC PANEL - Abnormal; Notable for the following:    Glucose, Bld 279 (*)    Creatinine, Ser 1.33 (*)    Total Bilirubin 0.2 (*)    GFR calc non Af Amer 42 (*)    GFR calc Af Amer 48 (*)    All other components within normal limits  LIPASE, BLOOD - Abnormal; Notable for the following:    Lipase 54 (*)    All other components within normal limits  URINALYSIS, ROUTINE W REFLEX MICROSCOPIC (NOT AT [facility]) - Abnormal; Notable for the following:    Glucose, UA 250 (*)    All other components within normal limits  I-STAT TROPOININ, ED  Rosezena SensorI-STAT TROPOININ, ED    Imaging Review Dg Chest 2 View  08/22/2015  CLINICAL DATA:  Chest pain.  Atrial fibrillation.  Sarcoidosis. EXAM: CHEST  2 VIEW COMPARISON:  05/14/2015 FINDINGS: The heart size and mediastinal contours are within normal limits. Both lungs are clear. The visualized skeletal structures are unremarkable. Mild hilar adenopathy better seen on prior chest CT 08/02/2014 IMPRESSION: No active cardiopulmonary disease. Electronically Signed   By: Marlan Palauharles  Clark M.D.   On: 08/22/2015 12:41     I have personally reviewed and evaluated these images and lab results as part of my medical decision-making.   EKG Interpretation   Date/Time:  Thursday August 22 2015 11:04:33 EST Ventricular Rate:  123 PR Interval:    QRS Duration: 80 QT Interval:  344 QTC Calculation: 492 R Axis:   -42 Text Interpretation:  Atrial fibrillation with rapid ventricular response  Left axis deviation Low voltage QRS Nonspecific ST and T wave abnormality  Abnormal ECG Confirmed by DELO  MD, DOUGLAS ([id number]) on 08/22/2015 11:33:14  AM      MDM   Final  diagnoses:  Chest pain, unspecified chest pain type  Atrial fibrillation with RVR (HCC)    65 year old female with recent history of nasal congestion and cough presents with chest pain, which she states started today. Notes associated dizziness, lightheadedness, shortness of breath, and nausea. States she has not taken her medications today.  Patient is afebrile. Tachycardic to 120s. Heart irregularly irregular. Lungs clear to auscultation bilaterally. Abdomen soft, nondistended, with mild tenderness palpation left upper quadrant. No rebound, guarding, or masses.  Patient started on diltiazem drip. Labs pending.  EKG reveals atrial fibrillation with RVR, heart rate 123. Troponin negative. Chest x-ray negative for active cardiopulmonary disease. CBC negative for leukocytosis or anemia. CMP remarkable for creatinine of 1.33, which appears stable from baseline. Lipase elevated at 54, which appears chronic.  Patient discussed with Dr. Judd Lienelo. Will consult cardiology regarding acute management. Spoke with cardiology PA, who advised patient will be seen in the ED. Repeat EKG atrial fibrillation, HR 97. Delta troponin negative.  Per cardiology note, patient to be admitted for further evaluation and management.  BP 120/82 mmHg  Pulse 75  Temp(Src)   Resp 9  SpO2 96%    Mady Gemmalizabeth C Westfall, PA-C 08/22/15 1646  Geoffery Lyonsouglas Delo, MD 08/24/15 [phone]

## 2015-08-23 ENCOUNTER — Encounter (HOSPITAL_COMMUNITY)
Admission: EM | Disposition: A | Discharge status: Home / Self Care | Payer: Self-pay | Source: Home / Self Care | Attending: Emergency Medicine

## 2015-08-23 ENCOUNTER — Encounter (HOSPITAL_COMMUNITY): Payer: Self-pay | Admitting: General Practice

## 2015-08-23 DIAGNOSIS — D72819 Decreased white blood cell count, unspecified: Secondary | ICD-10-CM

## 2015-08-23 DIAGNOSIS — I480 Paroxysmal atrial fibrillation: Secondary | ICD-10-CM | POA: Diagnosis not present

## 2015-08-23 LAB — BASIC METABOLIC PANEL
Anion gap: 11 (ref 5–15)
BUN: 13 mg/dL (ref 6–20)
CO2: 22 mmol/L (ref 22–32)
CREATININE: 1.31 mg/dL — AB (ref 0.44–1.00)
Calcium: 9.4 mg/dL (ref 8.9–10.3)
Chloride: 106 mmol/L (ref 101–111)
GFR calc Af Amer: 49 mL/min — ABNORMAL LOW (ref >60)
GFR, EST NON AFRICAN AMERICAN: 42 mL/min — AB (ref >60)
GLUCOSE: 249 mg/dL — AB (ref 65–99)
Potassium: 4.4 mmol/L (ref 3.5–5.1)
SODIUM: 139 mmol/L (ref 135–145)

## 2015-08-23 LAB — CBC
HCT: 35.5 % — ABNORMAL LOW (ref 36.0–46.0)
Hemoglobin: 11.6 g/dL — ABNORMAL LOW (ref 12.0–15.0)
MCH: 29.2 pg (ref 26.0–34.0)
MCHC: 32.7 g/dL (ref 30.0–36.0)
MCV: 89.4 fL (ref 78.0–100.0)
PLATELETS: 147 10*3/uL — AB (ref 150–400)
RBC: 3.97 MIL/uL (ref 3.87–5.11)
RDW: 12.8 % (ref 11.5–15.5)
WBC: 3.5 10*3/uL — ABNORMAL LOW (ref 4.0–10.5)

## 2015-08-23 LAB — TROPONIN I: Troponin I: <0.03 ng/mL (ref <0.031)

## 2015-08-23 LAB — GLUCOSE, CAPILLARY
Glucose-Capillary: 232 mg/dL — ABNORMAL HIGH (ref 65–99)
Glucose-Capillary: 196 mg/dL — ABNORMAL HIGH (ref 65–99)

## 2015-08-23 LAB — MAGNESIUM: Magnesium: 2.2 mg/dL (ref 1.7–2.4)

## 2015-08-23 SURGERY — CARDIOVERSION
Anesthesia: Monitor Anesthesia Care

## 2015-08-23 MED ORDER — DILTIAZEM HCL ER COATED BEADS 120 MG PO CP24
120.0000 mg | ORAL_CAPSULE | Freq: Every day | ORAL | Status: DC
  Administered 2015-08-23: 120 mg via ORAL
  Filled 2015-08-23: qty 1

## 2015-08-23 MED ORDER — MAGNESIUM OXIDE 400 (241.3 MG) MG PO TABS: 400.0000 mg | ORAL_TABLET | Freq: Every day | ORAL | Status: DC

## 2015-08-23 MED ORDER — INSULIN GLARGINE 100 UNIT/ML SOLOSTAR PEN: 30.0000 [IU] | PEN_INJECTOR | Freq: Every day | SUBCUTANEOUS | Status: DC

## 2015-08-23 MED ORDER — DILTIAZEM HCL ER COATED BEADS 120 MG PO CP24: 120.0000 mg | ORAL_CAPSULE | Freq: Every day | ORAL | Status: DC

## 2015-08-23 NOTE — Progress Notes (Signed)
CHADSVASC 3 for documentation purposes.

## 2015-08-23 NOTE — Progress Notes (Signed)
PROGRESS NOTE  Subjective:   65 y/o F with history of PAF on [name]/Xarelto, CKD stage III, normal coronaries in 2012 (normal nuc 09/2014), sarcoidosis of lung, asthma, HTN, HLD, DM, GERD, migraine, seizures, peripheral neuropathy who [facility] with cough/URI symptoms, dizziness, and chest discomfort. She was found to be back in atrial fib  She converted overnight.    Objective:    Vital Signs:   Temp:  [97.9 F (36.6 C)-98.5 F (36.9 C)] 98.4 F (36.9 C) (01/06 1100) Pulse Rate:  [75-110] 110 (01/05 1854) Resp:  [9-25] 18 (01/05 2300) BP: (110-150)/(63-104) 126/69 mmHg (01/06 1059) SpO2:  [94 %-98 %] 95 % (01/06 0902) Weight:  [209 lb 3.5 oz (94.9 kg)-209 lb 6.4 oz (94.983 kg)] 209 lb 6.4 oz (94.983 kg) (01/06 0429)      24-hour weight change: Weight change:   Weight trends: Filed Weights   08/22/15 1854 08/23/15 0429  Weight: 209 lb 3.5 oz (94.9 kg) 209 lb 6.4 oz (94.983 kg)    Intake/Output:  01/05 0701 - 01/06 0700 In: -  Out: 300 [Urine:300] Total I/O In: 3 [I.V.:3] Out: -    Physical Exam: BP 126/69 mmHg  Pulse 110  Temp(Src) 98.4 F (36.9 C) (Oral)  Resp 18  Ht 5\' 6"  (1.676 m)  Wt 209 lb 6.4 oz (94.983 kg)  BMI 33.81 kg/m2  SpO2 95%  Wt Readings from Last 3 Encounters:  08/23/15 209 lb 6.4 oz (94.983 kg)  07/26/15 205 lb (92.987 kg)  07/09/15 205 lb (92.987 kg)    General: Vital signs reviewed and noted.   Head: Normocephalic, atraumatic.  Eyes: conjunctivae/corneas clear.  EOM's intact.   Throat: normal  Neck:  normal   Lungs:    few rhonchi  Heart:  RR   Abdomen:  Soft, non-tender, non-distended    Extremities: No edema    Neurologic: A&O X3, CN II - XII are grossly intact.   Psych: Normal     Labs: BMET:  Recent Labs  08/22/15 1115 08/22/15 2100 08/23/15 0745  NA 139  --  139  K 4.7  --  4.4  CL 102  --  106  CO2 25  --  22  GLUCOSE 279*  --  249*  BUN 14  --  13  CREATININE 1.33*  --   1.31*  CALCIUM 9.5  --  9.4  MG  --  1.8 2.2    Liver function tests:  Recent Labs  08/22/15 1115  AST 26  ALT 21  ALKPHOS 108  BILITOT 0.2*  PROT 7.7  ALBUMIN 4.1    Recent Labs  08/22/15 1115  LIPASE 54*    CBC:  Recent Labs  08/22/15 1115 08/23/15 0745  WBC 3.1* 3.5*  HGB 12.4 11.6*  HCT 39.1 35.5*  MCV 88.7 89.4  PLT 153 147*    Cardiac Enzymes:  Recent Labs  08/22/15 2100 08/23/15 0112 08/23/15 0745  TROPONINI <0.03 <0.03 <0.03    Coagulation Studies: No results for input(s): LABPROT, INR in the last 72 hours.  Other: Invalid input(s): POCBNP No results for input(s): DDIMER in the last 72 hours. No results for input(s): HGBA1C in the last 72 hours. No results for input(s): CHOL, HDL, LDLCALC, TRIG, CHOLHDL in the last 72 hours.  Recent Labs  08/22/15 2100  TSH 2.486   No results for input(s): VITAMINB12, FOLATE, FERRITIN, TIBC, IRON, RETICCTPCT in the last 72 hours.   Other results:  Tele  ( personally reviewed )  -NSR  Medications:    Infusions: . sodium chloride      Scheduled Medications: . atorvastatin  40 mg Oral QHS  . calcium carbonate  625 mg Oral Daily  . dofetilide  250 mcg Oral BID  . doxazosin  4 mg Oral Daily  . ferrous sulfate  325 mg Oral Q breakfast  . insulin aspart  0-9 Units Subcutaneous TID WC  . insulin glargine  25 Units Subcutaneous QHS  . lamoTRIgine  100 mg Oral QHS  . levETIRAcetam  2,000 mg Oral QHS  . loratadine  10 mg Oral q morning - 10a  . mometasone-formoterol  2 puff Inhalation BID  . Oxcarbazepine  300 mg Oral BID  . pantoprazole  80 mg Oral Q1200  . potassium chloride SA  40 mEq Oral Daily  . pregabalin  100 mg Oral TID  . rivaroxaban  20 mg Oral Q supper  . sodium chloride  3 mL Intravenous Q12H  . sodium chloride  3 mL Intravenous Q12H  . vitamin C  500 mg Oral BID    Assessment/ Plan:   Principal Problem:   PAF (paroxysmal atrial fibrillation) (HCC) Active Problems:    Essential hypertension   Atrial fibrillation (HCC)   Chest pain   Sinus congestion   CKD (chronic kidney disease), stage III  1. Atrial fib - paroxysmal .  Has converted overnight Continue [name] xarelto  2. Hyperlipidemia:   Continue atorvastatin    Disposition: DC to home Length of Stay:   Alvia Grove., MD, [facility] 08/23/2015, 2:15 PM Office [phone] Pager [phone]

## 2015-08-23 NOTE — Discharge Instructions (Signed)
Atrial Fibrillation °Atrial fibrillation is a type of irregular or rapid heartbeat (arrhythmia). In atrial fibrillation, the heart quivers continuously in a chaotic pattern. This occurs when parts of the heart receive disorganized signals that make the heart unable to pump blood normally. This can increase the risk for stroke, heart failure, and other heart-related conditions. There are different types of atrial fibrillation, including: °· Paroxysmal atrial fibrillation. This type starts suddenly, and it usually stops on its own shortly after it starts. °· Persistent atrial fibrillation. This type often lasts longer than a week. It may stop on its own or with treatment. °· Long-lasting persistent atrial fibrillation. This type lasts longer than 12 months. °· Permanent atrial fibrillation. This type does not go away. °Talk with your health care provider to learn about the type of atrial fibrillation that you have. °CAUSES °This condition is caused by some heart-related conditions or procedures, including: °· A heart attack. °· Coronary artery disease. °· Heart failure. °· Heart valve conditions. °· High blood pressure. °· Inflammation of the sac that surrounds the heart (pericarditis). °· Heart surgery. °· Certain heart rhythm disorders, such as Wolf-Parkinson-White syndrome. °Other causes include: °· Pneumonia. °· Obstructive sleep apnea. °· Blockage of an artery in the lungs (pulmonary embolism, or PE). °· Lung cancer. °· Chronic lung disease. °· Thyroid problems, especially if the thyroid is overactive (hyperthyroidism). °· Caffeine. °· Excessive alcohol use or illegal drug use. °· Use of some medicines, including certain decongestants and diet pills. °Sometimes, the cause cannot be found. °RISK FACTORS °This condition is more likely to develop in: °· People who are older in age. °· People who smoke. °· People who have diabetes mellitus. °· People who are overweight (obese). °· Athletes who exercise  vigorously. °SYMPTOMS °Symptoms of this condition include: °· A feeling that your heart is beating rapidly or irregularly. °· A feeling of discomfort or pain in your chest. °· Shortness of breath. °· Sudden light-headedness or weakness. °· Getting tired easily during exercise. °In some cases, there are no symptoms. °DIAGNOSIS °Your health care provider may be able to detect atrial fibrillation when taking your pulse. If detected, this condition may be diagnosed with: °· An electrocardiogram (ECG). °· A Holter monitor test that records your heartbeat patterns over a 24-hour period. °· Transthoracic echocardiogram (TTE) to evaluate how blood flows through your heart. °· Transesophageal echocardiogram (TEE) to view more detailed images of your heart. °· A stress test. °· Imaging tests, such as a CT scan or chest X-ray. °· Blood tests. °TREATMENT °The main goals of treatment are to prevent blood clots from forming and to keep your heart beating at a normal rate and rhythm. The type of treatment that you receive depends on many factors, such as your underlying medical conditions and how you feel when you are experiencing atrial fibrillation. °This condition may be treated with: °· Medicine to slow down the heart rate, bring the heart's rhythm back to normal, or prevent clots from forming. °· Electrical cardioversion. This is a procedure that resets your heart's rhythm by delivering a controlled, low-energy shock to the heart through your skin. °· Different types of ablation, such as catheter ablation, catheter ablation with pacemaker, or surgical ablation. These procedures destroy the heart tissues that send abnormal signals. When the pacemaker is used, it is placed under your skin to help your heart beat in a regular rhythm. °HOME CARE INSTRUCTIONS °· Take over-the counter and prescription medicines only as told by your health care provider. °·   If your health care provider prescribed a blood-thinning medicine  (anticoagulant), take it exactly as told. Taking too much blood-thinning medicine can cause bleeding. If you do not take enough blood-thinning medicine, you will not have the protection that you need against stroke and other problems.  Do not use tobacco products, including cigarettes, chewing tobacco, and e-cigarettes. If you need help quitting, ask your health care provider.  If you have obstructive sleep apnea, manage your condition as told by your health care provider.  Do not drink alcohol.  Do not drink beverages that contain caffeine, such as coffee, soda, and tea.  Maintain a healthy weight. Do not use diet pills unless your health care provider approves. Diet pills may make heart problems worse.  Follow diet instructions as told by your health care provider.  Exercise regularly as told by your health care provider.  Keep all follow-up visits as told by your health care provider. This is important. PREVENTION  Avoid drinking beverages that contain caffeine or alcohol.  Avoid certain medicines, especially medicines that are used for breathing problems.  Avoid certain herbs and herbal medicines, such as those that contain ephedra or ginseng.  Do not use illegal drugs, such as cocaine and amphetamines.  Do not smoke.  Manage your high blood pressure. SEEK MEDICAL CARE IF:  You notice a change in the rate, rhythm, or strength of your heartbeat.  You are taking an anticoagulant and you notice increased bruising.  You tire more easily when you exercise or exert yourself. SEEK IMMEDIATE MEDICAL CARE IF:  You have chest pain, abdominal pain, sweating, or weakness.  You feel nauseous.  You notice blood in your vomit, bowel movement, or urine.  You have shortness of breath.  You suddenly have swollen feet and ankles.  You feel dizzy.  You have sudden weakness or numbness of the face, arm, or leg, especially on one side of the body.  You have trouble speaking,  trouble understanding, or both (aphasia).  Your face or your eyelid droops on one side. These symptoms may represent a serious problem that is an emergency. Do not wait to see if the symptoms will go away. Get medical help right away. Call your local emergency services (911 in the [place] Do not drive yourself to the hospital.   This information is not intended to replace advice given to you by your health care provider. Make sure you discuss any questions you have with your health care provider.   Document Released: 08/03/2005 Document Revised: 04/24/2015 Document Reviewed: 11/28/2014 Elsevier Interactive Patient Education 2016 [employer]  Information on my medicine - XARELTO (Rivaroxaban)  This medication education was reviewed with me or my healthcare representative as part of my discharge preparation.  The pharmacist that spoke with me during my hospital stay was:  Gardner CandleCarney, Amarius Toto C, [facility]  Why was Xarelto prescribed for you? Xarelto was prescribed for you to reduce the risk of a blood clot forming that can cause a stroke if you have a medical condition called atrial fibrillation (a type of irregular heartbeat).  What do you need to know about xarelto ? Take your Xarelto ONCE DAILY at the same time every day with your evening meal. If you have difficulty swallowing the tablet whole, you may crush it and mix in applesauce just prior to taking your dose.  Take Xarelto exactly as prescribed by your doctor and DO NOT stop taking Xarelto without talking to the doctor who prescribed the medication.  Stopping without other  stroke prevention medication to take the place of Xarelto may increase your risk of developing a clot that causes a stroke.  Refill your prescription before you run out.  After discharge, you should have regular check-up appointments with your healthcare provider that is prescribing your Xarelto.  In the future your dose may need to be changed if your kidney function  or weight changes by a significant amount.  What do you do if you miss a dose? If you are taking Xarelto ONCE DAILY and you miss a dose, take it as soon as you remember on the same day then continue your regularly scheduled once daily regimen the next day. Do not take two doses of Xarelto at the same time or on the same day.   Important Safety Information A possible side effect of Xarelto is bleeding. You should call your healthcare provider right away if you experience any of the following: ? Bleeding from an injury or your nose that does not stop. ? Unusual colored urine (red or dark brown) or unusual colored stools (red or black). ? Unusual bruising for unknown reasons. ? A serious fall or if you hit your head (even if there is no bleeding).  Some medicines may interact with Xarelto and might increase your risk of bleeding while on Xarelto. To help avoid this, consult your healthcare provider or pharmacist prior to using any new prescription or non-prescription medications, including herbals, vitamins, non-steroidal anti-inflammatory drugs (NSAIDs) and supplements.  This website has more information on Xarelto: VisitDestination.com.br.

## 2015-08-23 NOTE — H&P (Signed)
History and Physical  Patient ID: [name] MRN: [id number], DOB: 06-09-1951 Date of Encounter: 08/22/2015, 4:02 PM Primary Physician: Danella MaiersAsiyah Z Mikell, MD Primary Cardiologist: Dr. Eden EmmsNishan  Chief Complaint: chest pain, dizziness Reason for Admission: recurrent atrial fib  Requesting MD: Ms. Kerrie BuffaloWestfall, PA-C  HPI:[name] is 65 y/o F with history of PAF on [name]/Xarelto, CKD stage III, normal coronaries in 2012 (normal nuc 09/2014), sarcoidosis of lung, asthma, HTN, HLD, DM, GERD, migraine, seizures, peripheral neuropathy who [name] with cough/URI symptoms, dizziness, and chest discomfort. She was found to be back in atrial fib.  Her last recurrence was in 08/2014 in the setting of GI illness while on [name], which was continued at that time. She had spontaneously converted on diltiazem drip. She had a negative stress test in follow-up due to minimally elevated troponins which was normal with EF 65%.   [facility] with complaints of weakness and dizziness. She reports she's had a sinus infection ever since November. She says she has been tried on 2 courses of antibiotics but symptoms seem to keep coming back up. She said her pulmonologist planned to do a CT of her sinuses if it recurred. She hasn't felt great since New Year's Eve. She has been taking Mucinex but denies any decongestant use. Today while riding in the car with her sister, she developed a sensation of dizziness, weakness, and chest "muscle spasms" so came to the ER for evaluation. She was found to be in AF RVR rates 120s. She's not completely sure but she wonders if she has been back in AF for a couple days. She reports the chest discomfort is worse with subtle movements in bed and palpation of her chest wall. No SOB, fevers, nausea, LEE, orthopnea. In the ER she is afebrile. Labwork notable for Cr 1.33 (similar to prior), WBC 3.1 (appears chronic), troponin 0.00. CXR without active  disease. Diltiazem drip was started with improvement in HR to 80s-90s. She reports ongoing "nagging" chest discomfort that was partially relieved by morphine 4mg  x 2 but has been fairly constant all day long. She reports compliance with meds except missed this AM's [name] dose due to coming to the hospital.  Past Medical History  Diagnosis Date  . Essential hypertension   . Hyperlipidemia   . Colon polyps   . Diverticulosis   . Gallstones   . IBS (irritable bowel syndrome)   . Kidney stones   . Paroxysmal a-fib (HCC)     a. On [name]. b. Recurrence 08/2014 in setting of GI illness.  . Depression   . Asthma     Dr. Sherene SiresWert  . Sarcoidosis of lung (HCC)     Dr. Sherene SiresWert  . Fibromyalgia   . Peripheral neuropathy (HCC)   . Type II diabetes mellitus (HCC)   . H/O hiatal hernia   . GERD (gastroesophageal reflux disease)   . Migraine   . Epilepsy (HCC)   . Arthritis   . Chronic back pain   . Anxiety   . History of cardiac catheterization     a. Normal coronaries 2012. b. Normal nuc 09/2014.  . Seizures (HCC)   . CKD (chronic kidney disease), stage III     Surgical History:  Past Surgical History  Procedure Laterality Date  . Closed reduction ankle fracture Left 10/2006  . Resection of hand neuroma Left 01/2002  . Neuroplasty / transposition median nerve at carpal tunnel Left 2004  . Esophageal manometry  03/21/2012    Procedure: ESOPHAGEAL MANOMETRY (EM); Surgeon: [name], MD; Location: [facility]; Service: Endoscopy; Laterality: N/A;  . Colon surgery    . Anterior cervical decomp/discectomy fusion  07/11/2012    Procedure: ANTERIOR CERVICAL DECOMPRESSION/DISCECTOMY FUSION 1 LEVEL; Surgeon: [name], MD; Location: [facility]; Service: Neurosurgery; Laterality: N/A; Cervical Five-Six Anterior Cervical Decompression with Fusion Interbody  Prothesis Plating and Bonegraft  . Cholecystectomy  1976  . Neuroplasty / transposition median nerve at carpal tunnel Right 2002  . Abdominal hysterectomy  1995  . Salpingoophorectomy Bilateral 2000  . Dilation and curettage of uterus    . Tubal ligation  1982  . Fracture surgery    . Cardiac catheterization  2012     Home Meds: Prior to Admission medications   Medication Sig Start Date End Date Taking? Authorizing Provider  albuterol (PROVENTIL HFA;VENTOLIN HFA) 108 (90 BASE) MCG/ACT inhaler Inhale 2 puffs into the lungs every 6 (six) hours as needed for wheezing or shortness of breath. 05/27/15   Asiyah Mayra Reel, MD  amoxicillin-clavulanate (AUGMENTIN) 875-125 MG tablet Take 1 tablet by mouth 2 (two) times daily. 07/09/15   Nyoka Cowden, MD  ARIPiprazole (ABILIFY) 10 MG tablet Take 10 mg by mouth daily.    Historical Provider, MD  atorvastatin (LIPITOR) 40 MG tablet Take 40 mg by mouth daily. 04/07/15   Historical Provider, MD  calcium carbonate (OS-CAL) 600 MG TABS Take 1 tablet (600 mg total) by mouth daily. 10/19/11   Tammy S Parrett, NP  cholecalciferol (VITAMIN D) 1000 UNITS tablet Take 1,000 Units by mouth 2 (two) times daily.     Historical Provider, MD  Cranberry 500 MG CAPS Take 500 mg by mouth 2 (two) times daily.    Historical Provider, MD  docusate sodium (COLACE) 100 MG capsule Take 100 mg by mouth 2 (two) times daily as needed for constipation. For stool softner    Historical Provider, MD  dofetilide ([name]) 250 MCG capsule Take 1 capsule (250 mcg total) by mouth 2 (two) times daily. 10/26/14   Wendall Stade, MD  doxazosin (CARDURA) 4 MG tablet Take 4 mg by mouth daily.    Historical Provider, MD  DULoxetine (CYMBALTA) 60 MG capsule Take 60 mg by mouth daily.    Historical Provider, MD  esomeprazole (NEXIUM) 40 MG capsule Take 1 capsule (40 mg total) by mouth  daily. 05/02/15   Asiyah Mayra Reel, MD  ferrous sulfate 325 (65 FE) MG tablet Take 325 mg by mouth daily with breakfast.    Historical Provider, MD  fluconazole (DIFLUCAN) 100 MG tablet Take 1 tablet (100 mg total) by mouth daily. 08/07/15   Nyoka Cowden, MD  glucose blood test strip Use as instructed 06/13/15   Asiyah Mayra Reel, MD  Insulin Glargine (LANTUS SOLOSTAR) 100 UNIT/ML Solostar Pen Inject 25 Units into the skin at bedtime. Patient taking differently: Inject 30 Units into the skin at bedtime.  09/11/14   Ripudeep Jenna Luo, MD  lamoTRIgine (LAMICTAL) 25 MG tablet 4 at bedtime 05/21/15   Historical Provider, MD  levETIRAcetam (KEPPRA XR) 500 MG 24 hr tablet Take 4 tablets (2,000 mg total) by mouth at bedtime. 06/11/15   Suanne Marker, MD  loratadine (CLARITIN) 10 MG tablet Take 10 mg by mouth every morning.    Historical Provider, MD  mometasone-formoterol (DULERA) 100-5 MCG/ACT AERO Take 2 puffs first thing in am and then another 2 puffs about 12 hours later. 07/09/15   Charlaine Dalton  Wert, MD  nitroGLYCERIN (NITROSTAT) 0.4 MG SL tablet Place 0.4 mg under the tongue every 5 (five) minutes as needed for chest pain (do nto exceed 3 doses).     Historical Provider, MD  NOVOLOG FLEXPEN 100 UNIT/ML FlexPen Inject 10 Units as directed 2 (two) times daily.  03/29/15   Historical Provider, MD  ondansetron (ZOFRAN-ODT) 8 MG disintegrating tablet Take 8 mg by mouth every 6 (six) hours as needed for nausea.    Historical Provider, MD  oxyCODONE-acetaminophen (PERCOCET) 10-325 MG per tablet Take 1 tablet by mouth every 8 (eight) hours as needed for pain. For pain 08/04/14   Vassie Loll, MD  potassium chloride SA (K-DUR,KLOR-CON) 20 MEQ tablet Take 40 mEq by mouth daily. 02/07/15   Historical Provider, MD  pregabalin (LYRICA) 100 MG capsule Take 100 mg by mouth 3 (three) times daily.    Historical Provider, MD   vitamin C (ASCORBIC ACID) 500 MG tablet Take 500 mg by mouth 2 (two) times daily.    Historical Provider, MD  VOLTAREN 1 % GEL Apply 2 g topically 4 (four) times daily as needed. For pain 08/13/14   Historical Provider, MD  XARELTO 20 MG TABS tablet TAKE 1 TABLET BY MOUTH ONCE DAILY WITH SUPPER 08/22/15   Wendall Stade, MD    Allergies:  Allergies  Allergen Reactions  . Prednisone     SENT INTO AFIB   . Amitriptyline Other (See Comments)    Disoriented.  . Hydromorphone Hcl Other (See Comments)    blood pressure drops.    Social History   Social History  . Marital Status: Divorced    Spouse Name: N/A  . Number of Children: 3  . Years of Education: 12th   Occupational History  . disabled     CNA   Social History Main Topics  . Smoking status: Never Smoker   . Smokeless tobacco: Never Used  . Alcohol Use: No  . Drug Use: No  . Sexual Activity: Not Currently   Other Topics Concern  . Not on file   Social History Narrative   Pt. Lives at home with her daughter. She is single, has three children, does not work currently, and has a 12th grade education level. She has never used tobacco or alcohol. Quit using illicit drugs at the age of 50 and rarely ha caffeine.     Family History  Problem Relation Age of Onset  . Heart attack Mother     @ age 22  . Mental illness Mother   . Diabetes Mother   . Hypertension Mother     siblings  . Alzheimer's disease Mother   . Depression Mother   . Hyperlipidemia Mother   . Heart attack Brother 50  . Alcohol abuse Brother   . Depression Brother   . Diabetes Brother   . Hyperlipidemia Brother   . Hypertension Brother   . Kidney disease Brother   . Drug abuse Brother   . Colon cancer Maternal Aunt   . Prostate cancer Maternal Grandfather   . Diabetes  Maternal Grandfather   . Hyperlipidemia Maternal Grandfather   . Ovarian cancer Maternal Aunt   . Diabetes    . Hypertension    . Lupus Sister   . Alcohol abuse Sister   . Depression Sister   . Diabetes Sister   . Hyperlipidemia Sister   . Hypertension Sister   . Kidney disease Sister   . Drug abuse Sister   . Ovarian cancer  Cousin   . Alcohol abuse Father   . Heart attack Father   . Hyperlipidemia Father   . Hypertension Father   . Diabetes Maternal Grandmother   . Hyperlipidemia Maternal Grandmother     Review of Systems: All other systems reviewed and are otherwise negative except as noted above.  Labs:  Lab Results  Component Value Date   WBC 3.1* 08/22/2015   HGB 12.4 08/22/2015   HCT 39.1 08/22/2015   MCV 88.7 08/22/2015   PLT 153 08/22/2015    Recent Labs Lab 08/22/15 1115  NA 139  K 4.7  CL 102  CO2 25  BUN 14  CREATININE 1.33*  CALCIUM 9.5  PROT 7.7  BILITOT 0.2*  ALKPHOS 108  ALT 21  AST 26  GLUCOSE 279*    Radiology/Studies:   Imaging Results    Dg Chest 2 View  08/22/2015 CLINICAL DATA: Chest pain. Atrial fibrillation. Sarcoidosis. EXAM: CHEST 2 VIEW COMPARISON: 05/14/2015 FINDINGS: The heart size and mediastinal contours are within normal limits. Both lungs are clear. The visualized skeletal structures are unremarkable. Mild hilar adenopathy better seen on prior chest CT 08/02/2014 IMPRESSION: No active cardiopulmonary disease. Electronically Signed By: Marlan Palau M.D. On: 08/22/2015 12:41    Wt Readings from Last 3 Encounters:  07/26/15 205 lb (92.987 kg)  07/09/15 205 lb (92.987 kg)  06/28/15 205 lb (92.987 kg)    EKG:  1) atrial fib 123bpm, low voltage, left axis deviation, no acute changes 2) atrial fib 97bpm, low voltage,left axis deviation, no acute changes  Physical  Exam: Blood pressure 128/89, pulse 96, resp. rate 15, SpO2 97 %. General: Well developed, well nourished AAF, in no acute distress. Head: Normocephalic, atraumatic, sclera non-icteric, no xanthomas, nares are without discharge. Sounds congested. Neck: Negative for carotid bruits. JVD not elevated. Lungs: Clear bilaterally to auscultation without wheezes, rales, or rhonchi. Breathing is unlabored. Heart: Irregularly irregular, rate controlled. No murmurs, rubs, or gallops appreciated. Chest wall tender to palpation. Abdomen: Soft, non-tender, non-distended with normoactive bowel sounds. No hepatomegaly. No rebound/guarding. No obvious abdominal masses. Msk: Strength and tone appear normal for age. Extremities: No clubbing or cyanosis. No edema. Distal pedal pulses are 2+ and equal bilaterally. Neuro: Alert and oriented X 3. No focal deficit. No facial asymmetry. Moves all extremities spontaneously. Psych: Responds to questions appropriately with a normal affect.    ASSESSMENT AND PLAN:   1. Recurrent PAF - she historically does not feel good when in atrial fibrillation. Will admit and continue IV diltiazem. She reports full compliance with Xarelto with no missed doses. Will plan for DCCV in AM if she does not convert. This is scheduled for tomorrow at 12pm with Dr. Royann Shivers. If she has another recurrence of atrial fib, will need f/u with EP to discuss alternative antiarrhythmic choices. Continue current regimen otherwise.  2. Chest pain, atypical - persistent for most of the day with negative troponins thus far. Continue to cycle. Suspect musculoskeletal.   3. Recently chronic sinusitis - does not appear acutely toxic. Afebrile, normal WBC. This could be contributing to her sensation of dizziness. She will need to f/u pulm/PCP for this.  4. Essential HTN - controlled.  5. CKD stage III - Cr near baseline.  6. Leukopenia - persistent over several labs. Will need f/u PCP.  Signed, Laurann Montana PA-C 08/22/2015, 4:02 PM Pager: [phone]  Attending Note:   The patient was seen and examined. Agree with assessment and plan as noted above. Changes made to  the above note as needed.  Has gone back into atrial fib Has not missed any doses of Xarelto Has some URI symptoms but no wheezing on exam. - I dont think this is contributing to her atrial fib  Will continue the Cardizem drip for now If she does not convert, will cardiovert her in the am. She clearly is symptomatic when she has rapid atrial fib.  Feels better on the Dilt drip    Vesta Mixer, Montez Hageman., MD, [facility] 08/22/2015, 4:35 PM [address], [address] Office [phone] Pager [phone]

## 2015-08-23 NOTE — Discharge Summary (Signed)
Discharge Summary    Patient ID: DEANE MELICK,  MRN: [id number], DOB/AGE: [birth year] 65 y.o.  Admit date: 08/22/2015 Discharge date: 08/23/2015  Primary Care Provider: Danella Maiers Primary Cardiologist: Dr. [name]  Discharge Diagnoses    Principal Problem:   PAF (paroxysmal atrial fibrillation) [facility]) Active Problems:   Essential hypertension   Chest pain   Sinus congestion   CKD (chronic kidney disease), stage III   Hypomagnesemia   Decreased white blood cell count   Allergies Allergies  Allergen Reactions  . Prednisone     SENT INTO AFIB   . Amitriptyline Other (See Comments)    Disoriented.  . Hydromorphone Hcl Other (See Comments)    blood pressure drops.    Diagnostic Studies/Procedures    N/A _____________   History of Present Illness     Ms. Grigorian is 65 y/o F with history of PAF on [name]/Xarelto, CKD stage III, normal coronaries in 2012 (normal nuc 09/2014), sarcoidosis of lung, asthma, HTN, HLD, DM, GERD, migraine, seizures, peripheral neuropathy who presented to [facility] yesterday with cough/URI symptoms, dizziness, and chest discomfort. She was found to be back in atrial fib.  Hospital Course     Her last recurrence was in 08/2014 in the setting of GI illness while on [name], which was continued at that time given infrequency of events. She had spontaneously converted on diltiazem drip. She had a negative stress test in follow-up due to minimally elevated troponins which was normal with EF 65%.   She presented to [facility] yesterday with complaints of weakness and dizziness. She reports she's had a sinus infection ever since November. She says she has been tried on 2 courses of antibiotics but symptoms seem to keep coming back up. She said her pulmonologist planned to do a CT of her sinuses if it recurred. She hasn't felt great since New Year's Eve. She has been taking Mucinex but denies any decongestant use. On day of admission while riding in  the car with her sister, she developed a sensation of dizziness, weakness, and chest "muscle spasms" so came to the ER for evaluation. She was found to be in AF RVR rates 120s. She felt like she may have been back in AF for a couple days. No SOB, fevers, nausea, LEE, orthopnea. She reported ongoing "nagging" chest discomfort lasting all day long. The chest discomfort was worse with subtle movements in bed and palpation of her chest wall. She was afebrile. Labwork notable for Cr 1.33 (similar to prior), WBC 3.1 (appears chronic), troponin 0.00. CXR without active disease. She was nontoxic appearing. Diltiazem drip was started with improvement in HR to 80s-90s. She reported compliance with meds except missed her AM [name] dose due to coming to the hospital.  She was admitted for further evaluation and monitoring. Chest pain was felt musculoskeletal - despite constant discomfort, troponins remained neg x 4. Electrolytes were checked since she is on [name]. K was 4.7. Mg was 1.8 so she was given magnesium sulfate and started on oral MagOx. Thyroid function was normal. Yesterday evening she converted to NSR on IV diltiazem, QTC . The IV diltiazem was stopped. This morning she remains in normal rhythm. EKG showed nonspecific ST-T changes but she was completely asymptomatic from a cardiac standpoint this AM. Dr. Elease Hashimoto recommended she start Diltiazem CD 120mg  daily and otherwise continue home meds. We will also prescribe MagOx at discharge. She was encouraged to f/u pulm/PCP for her sinus symptoms. She also had persistently decreased  WBC which will need further workup per IM. Dr. Elease Hashimoto has seen and examined the patient today and feels she is stable for discharge. Would consider f/u Mg level as outpatient at f/u appt as well as BMET (patient reported KCl dose is now 10 meq daily as opposed to daily which should be OK since K level was 4.7 this admission).  If she has recurrent AF, she would benefit from  evaluation by EP.  Consultants: N/A _____________  Discharge Vitals Blood pressure 126/69, pulse 110, temperature 98.4 F (36.9 C), temperature source Oral, resp. rate 18, height 5\' 6"  (1.676 m), weight 209 lb 6.4 oz (94.983 kg), SpO2 95 %.  Filed Weights   08/22/15 1854 08/23/15 0429  Weight: 209 lb 3.5 oz (94.9 kg) 209 lb 6.4 oz (94.983 kg)    Labs & Radiologic Studies     CBC  Recent Labs  08/22/15 1115 08/23/15 0745  WBC 3.1* 3.5*  HGB 12.4 11.6*  HCT 39.1 35.5*  MCV 88.7 89.4  PLT 153 147*   Basic Metabolic Panel  Recent Labs  08/22/15 1115 08/22/15 2100 08/23/15 0745  NA 139  --  139  K 4.7  --  4.4  CL 102  --  106  CO2 25  --  22  GLUCOSE 279*  --  249*  BUN 14  --  13  CREATININE 1.33*  --  1.31*  CALCIUM 9.5  --  9.4  MG  --  1.8 2.2   Liver Function Tests  Recent Labs  08/22/15 1115  AST 26  ALT 21  ALKPHOS 108  BILITOT 0.2*  PROT 7.7  ALBUMIN 4.1    Recent Labs  08/22/15 1115  LIPASE 54*   Thyroid Function Tests  Recent Labs  08/22/15 2100  TSH 2.486    Dg Chest 2 View  08/22/2015  CLINICAL DATA:  Chest pain.  Atrial fibrillation.  Sarcoidosis. EXAM: CHEST  2 VIEW COMPARISON:  05/14/2015 FINDINGS: The heart size and mediastinal contours are within normal limits. Both lungs are clear. The visualized skeletal structures are unremarkable. Mild hilar adenopathy better seen on prior chest CT 08/02/2014 IMPRESSION: No active cardiopulmonary disease. Electronically Signed   By: Marlan Palau M.D.   On: 08/22/2015 12:41    Disposition   Pt is being discharged home today in good condition.  Follow-up Plans & Appointments    Follow-up Information    Follow up with SIMMONS, BRITTAINY, PA-C.   Specialties:  Cardiology, Radiology   Why:  [facility] - post-hospital visit 09/12/15 at 9:30am. Avon Gully is one of Dr. Fabio Bering PAs.   Contact information:   [address] [place] [address] [phone]       Follow  up with Asiyah Angelene Giovanni, MD.   Specialty:  Family Medicine   Why:  Follow up with pulmonologist/primary care doctor for further evaluation of your sinus symptoms. Your white blood cell count also appears chronically mildly decreased, which will need to be followed by your primary doctor.   Contact information:   [address] [place] [address] [phone]      Discharge Instructions    Diet - low sodium heart healthy    Complete by:  As directed      Increase activity slowly    Complete by:  As directed   You were started on 2 new medicines: diltiazem and magnesium.           Discharge Medications  Current Discharge Medication List    START taking these medications   Details  diltiazem (CARDIZEM CD) 120 MG 24 hr capsule Take 1 capsule (120 mg total) by mouth daily. Qty: 30 capsule, Refills: 6    magnesium oxide (MAG-OX) 400 (241.3 Mg) MG tablet Take 1 tablet (400 mg total) by mouth daily. Qty: 30 tablet, Refills: 3      CONTINUE these medications which have CHANGED   Details  Insulin Glargine (LANTUS SOLOSTAR) 100 UNIT/ML Solostar Pen Inject 30 Units into the skin at bedtime. We did not change this medicine. This is how patient was taking at home. This AVS just displays it this way if we click continue.      CONTINUE these medications which have NOT CHANGED   Details  albuterol (PROVENTIL HFA;VENTOLIN HFA) 108 (90 BASE) MCG/ACT inhaler Inhale 2 puffs into the lungs every 6 (six) hours as needed for wheezing or shortness of breath.     ARIPiprazole (ABILIFY) 15 MG tablet Take 15 mg by mouth at bedtime.     atorvastatin (LIPITOR) 40 MG tablet Take 40 mg by mouth daily at 6 PM.      calcium carbonate (OS-CAL) 600 MG TABS Take 1 tablet (600 mg total) by mouth daily.    cholecalciferol (VITAMIN D) 1000 UNITS tablet Take 1,000 Units by mouth 2 (two) times daily.     Cranberry 500 MG CAPS Take 500 mg by mouth 2 (two) times daily.    dofetilide ([name]) 250 MCG  capsule Take 1 capsule (250 mcg total) by mouth 2 (two) times daily.     doxazosin (CARDURA) 4 MG tablet Take 4 mg by mouth at bedtime.     DULoxetine (CYMBALTA) 60 MG capsule Take 60 mg by mouth at bedtime.     esomeprazole (NEXIUM) 40 MG capsule Take 40 mg by mouth 2 (two) times daily before a meal.    EVZIO 0.4 MG/0.4ML SOAJ Inject 0.4 mLs as directed as needed (over dose).      ferrous sulfate 325 (65 FE) MG tablet Take 325 mg by mouth daily with breakfast.    lamoTRIgine (LAMICTAL) 100 MG tablet Take 100 mg by mouth at bedtime.     levETIRAcetam (KEPPRA XR) 500 MG 24 hr tablet Take 4 tablets (2,000 mg total) by mouth at bedtime.     loratadine (CLARITIN) 10 MG tablet Take 10 mg by mouth every morning.    mometasone-formoterol (DULERA) 100-5 MCG/ACT AERO Take 2 puffs first thing in am and then another 2 puffs about 12 hours later.     nitroGLYCERIN (NITROSTAT) 0.4 MG SL tablet Place 0.4 mg under the tongue every 5 (five) minutes as needed for chest pain (do nto exceed 3 doses).     NOVOLOG FLEXPEN 100 UNIT/ML FlexPen Inject 10 Units as directed 2 (two) times daily.      ondansetron (ZOFRAN-ODT) 8 MG disintegrating tablet Take 8 mg by mouth every 6 (six) hours as needed for nausea.    Oxcarbazepine (TRILEPTAL) 300 MG tablet Take 300 mg by mouth 2 (two) times daily.     oxyCODONE-acetaminophen (PERCOCET) 10-325 MG per tablet Take 1 tablet by mouth every 8 (eight) hours as needed for pain. For pain    potassium chloride (K-DUR,KLOR-CON) 10 MEQ tablet Take 10 mEq by mouth daily.    pregabalin (LYRICA) 100 MG capsule Take 100 mg by mouth 3 (three) times daily.    vitamin C (ASCORBIC ACID) 500 MG tablet Take 500 mg by mouth 2 (two)  times daily.    VOLTAREN 1 % GEL Apply 2 g topically 4 (four) times daily as needed. For pain     XARELTO 20 MG TABS tablet TAKE 1 TABLET BY MOUTH ONCE DAILY WITH SUPPER       STOP taking these medications     fluconazole (DIFLUCAN) 100 MG  tablet - patient completed course PTA      docusate sodium (COLACE) 100 MG capsule - patient discontinued PTA          Outstanding Labs/Studies   N/A  Duration of Discharge Encounter   Greater than 30 minutes including physician time.  Signed, Laurann Montana PA-C 08/23/2015, 3:19 PM  Attending Note:   The patient was seen and examined.  Agree with assessment and plan as noted above.  Changes made to the above note as needed.  Pt feels well. Converted to SR  Will add low dose Diltiazem.      Vesta Mixer, Montez Hageman., MD, [facility] 08/23/2015, 5:30 PM [address]  [address] Office [phone] Pager [phone]

## 2015-09-12 ENCOUNTER — Encounter: Payer: Self-pay | Admitting: Cardiology

## 2015-09-12 ENCOUNTER — Ambulatory Visit (INDEPENDENT_AMBULATORY_CARE_PROVIDER_SITE_OTHER): Payer: Medicaid Other | Admitting: Cardiology

## 2015-09-12 VITALS — BP 120/78 | HR 74 | Ht 66.0 in | Wt 209.1 lb

## 2015-09-12 DIAGNOSIS — N183 Chronic kidney disease, stage 3 unspecified: Secondary | ICD-10-CM

## 2015-09-12 LAB — MAGNESIUM: MAGNESIUM: 1.8 mg/dL (ref 1.5–2.5)

## 2015-09-12 LAB — BASIC METABOLIC PANEL
BUN: 18 mg/dL (ref 7–25)
CALCIUM: 9.7 mg/dL (ref 8.6–10.4)
CO2: 20 mmol/L (ref 20–31)
Chloride: 103 mmol/L (ref 98–110)
Creat: 1.49 mg/dL — ABNORMAL HIGH (ref 0.50–0.99)
Glucose, Bld: 213 mg/dL — ABNORMAL HIGH (ref 65–99)
Potassium: 4.4 mmol/L (ref 3.5–5.3)
SODIUM: 138 mmol/L (ref 135–146)

## 2015-09-12 MED ORDER — NITROGLYCERIN 0.4 MG SL SUBL: 0.4000 mg | SUBLINGUAL_TABLET | SUBLINGUAL | Status: DC | PRN

## 2015-09-12 NOTE — Progress Notes (Signed)
09/12/2015 [name]   08/11/1951  [id number]  Primary Physician Toni Angelene Giovanni, MD Primary Cardiologist: Dr. Eden [name]    Reason for Visit/CC:  [facility] F/u for Atrial Fibrillation  HPI:  [name] is 65 y/o F with history of PAF on [name]/Xarelto, CKD stage III, normal coronaries in 2012 (normal nuc 09/2014), sarcoidosis of lung, asthma, HTN, HLD, DM, GERD, migraine, seizures, peripheral neuropathy who presented to [facility] on 08/22/15 with cough/URI symptoms, dizziness, and chest discomfort. She was found to be back in atrial fib w/ a RVR in the 120s. She was afebrile. Labwork notable for Cr 1.33 (similar to prior), WBC 3.1 (appears chronic), troponin 0.00. CXR without active disease. She was nontoxic appearing. Diltiazem drip was started with improvement in HR to 80s-90s. She reported compliance with meds except missed her AM [name] dose due to coming to the hospital. She was admitted for further evaluation and monitoring. Chest pain was felt musculoskeletal - despite constant discomfort, troponins remained neg x 4. Electrolytes were checked since she is on [name]. K was 4.7. Mg was 1.8 so she was given magnesium sulfate and started on oral MagOx. Thyroid function was normal. She had spontaneous conversion back to NSR on IV Cardizem. She was transitioned to 120 mg of Diltiazem CD and prescribed Mag Ox at time of discharge. It was recommended that she get a f/u Mg lab draw as well as f/u BMP.   She presents back to clinic today for f/u. She reports that she has done well. She denies any recurrent palpitations. No symptoms of breakthrough atrial fibrillation. EKG shows NSR. HR is controlled in the mid 70s. QT/QTc is stable at 404/451 ms. BP is well controlled at 120/78. URI symptoms resolved. She reports full medication compliance with [name], Diltiazem and Xarelto. No abnormal bleeding.     Current Outpatient Prescriptions  Medication Sig Dispense Refill  . albuterol  (PROVENTIL HFA;VENTOLIN HFA) 108 (90 BASE) MCG/ACT inhaler Inhale 2 puffs into the lungs every 6 (six) hours as needed for wheezing or shortness of breath. 1 Inhaler 1  . ARIPiprazole (ABILIFY) 15 MG tablet Take 15 mg by mouth at bedtime.  0  . atorvastatin (LIPITOR) 40 MG tablet Take 40 mg by mouth daily at 6 PM.   0  . calcium carbonate (OS-CAL) 600 MG TABS tablet Take 600 mg by mouth daily.    . cholecalciferol (VITAMIN D) 1000 UNITS tablet Take 1,000 Units by mouth 2 (two) times daily.     . Cranberry 500 MG CAPS Take 500 mg by mouth 2 (two) times daily.    Marland Kitchen diltiazem (CARDIZEM CD) 120 MG 24 hr capsule Take 1 capsule (120 mg total) by mouth daily. 30 capsule 6  . dofetilide ([name]) 250 MCG capsule Take 1 capsule (250 mcg total) by mouth 2 (two) times daily. 180 capsule 4  . doxazosin (CARDURA) 4 MG tablet Take 4 mg by mouth at bedtime.     . DULoxetine (CYMBALTA) 60 MG capsule Take 60 mg by mouth at bedtime.     Marland Kitchen esomeprazole (NEXIUM) 40 MG capsule Take 40 mg by mouth 2 (two) times daily before a meal.    . EVZIO 0.4 MG/0.4ML SOAJ Inject 0.4 mLs as directed as needed (over dose).   0  . ferrous sulfate 325 (65 FE) MG tablet Take 325 mg by mouth daily with breakfast.    . Insulin Glargine (LANTUS SOLOSTAR) 100 UNIT/ML Solostar Pen Inject 30 Units into the skin at  bedtime.    . lamoTRIgine (LAMICTAL) 100 MG tablet Take 100 mg by mouth at bedtime.  0  . levETIRAcetam (KEPPRA XR) 500 MG 24 hr tablet Take 4 tablets (2,000 mg total) by mouth at bedtime. 360 tablet 4  . loratadine (CLARITIN) 10 MG tablet Take 10 mg by mouth every morning.    . magnesium oxide (MAG-OX) 400 (241.3 Mg) MG tablet Take 1 tablet (400 mg total) by mouth daily. 30 tablet 3  . mometasone-formoterol (DULERA) 100-5 MCG/ACT AERO Inhale 2 puffs into the lungs every 12 (twelve) hours.    . nitroGLYCERIN (NITROSTAT) 0.4 MG SL tablet Place 0.4 mg under the tongue every 5 (five) minutes as needed for chest pain (do nto exceed 3  doses).     . NOVOLOG FLEXPEN 100 UNIT/ML FlexPen Inject 10 Units as directed 2 (two) times daily.   0  . ondansetron (ZOFRAN-ODT) 8 MG disintegrating tablet Take 8 mg by mouth every 6 (six) hours as needed for nausea.    . Oxcarbazepine (TRILEPTAL) 300 MG tablet Take 300 mg by mouth 2 (two) times daily.  0  . oxyCODONE-acetaminophen (PERCOCET) 10-325 MG tablet Take 1 tablet by mouth every 6 (six) hours. (scheduled)    . potassium chloride (K-DUR,KLOR-CON) 10 MEQ tablet Take 10 mEq by mouth daily.    . pregabalin (LYRICA) 100 MG capsule Take 100 mg by mouth 3 (three) times daily.    . vitamin C (ASCORBIC ACID) 500 MG tablet Take 500 mg by mouth 2 (two) times daily.    . VOLTAREN 1 % GEL Apply 2 g topically 4 (four) times daily as needed. For pain  0  . XARELTO 20 MG TABS tablet TAKE 1 TABLET BY MOUTH ONCE DAILY WITH SUPPER 30 tablet 6   No current facility-administered medications for this visit.    Allergies  Allergen Reactions  . Prednisone     SENT INTO AFIB   . Amitriptyline Other (See Comments)    Disoriented.  . Hydromorphone Hcl Other (See Comments)    blood pressure drops.    Social History   Social History  . Marital Status: Divorced    Spouse Name: N/A  . Number of Children: 3  . Years of Education: 12th   Occupational History  . disabled     CNA   Social History Main Topics  . Smoking status: Never Smoker   . Smokeless tobacco: Never Used  . Alcohol Use: No  . Drug Use: No  . Sexual Activity: Not Currently   Other Topics Concern  . Not on file   Social History Narrative   Pt. Lives at home with her daughter. She is single, has three children, does not work currently, and has a 12th grade education level. She has never used tobacco or alcohol. Quit using illicit drugs at the age of 10 and rarely ha caffeine.     Review of Systems: General: negative for chills, fever, night sweats or weight changes.  Cardiovascular: negative for chest pain, dyspnea on  exertion, edema, orthopnea, palpitations, paroxysmal nocturnal dyspnea or shortness of breath Dermatological: negative for rash Respiratory: negative for cough or wheezing Urologic: negative for hematuria Abdominal: negative for nausea, vomiting, diarrhea, bright red blood per rectum, melena, or hematemesis Neurologic: negative for visual changes, syncope, or dizziness All other systems reviewed and are otherwise negative except as noted above.    Blood pressure 120/78, pulse 74, height  (1.676 m), weight 209 lb 1.9 oz (94.856 kg).  General  appearance: alert, cooperative and no distress Neck: no carotid bruit and no JVD Lungs: clear to auscultation bilaterally Heart: regular rate and rhythm, S1, S2 normal, no murmur, click, rub or gallop Extremities: no LEE Pulses: 2+ and symmetric Skin: warm and dry Neurologic: Grossly normal  EKG NSR with PVC. 75 bpm. QT/QTc stable at 404/451 ms.   ASSESSMENT AND PLAN:   1. PAF: EKG today shows NSR. HR 75 bpm. QT/QTc is stable at 404/451 ms. Continue [name] for rhythm control and diltiazem for rate control. We will check a BMP to assess K and renal function, as well as a Mg level given she is on [name] and electrolyte supplementation. Continue Xarelto for anticoagulation.   2. URI: symptoms resolved.   3. HTN: BP is well controlled.   PLAN  Keep f/u with Dr. Eden [name] in 1 month.   Robbie Lis PA-C 09/12/2015 9:27 AM

## 2015-09-12 NOTE — Patient Instructions (Signed)
Medication Instructions:  Your physician recommends that you continue on your current medications as directed. Please refer to the Current Medication list given to you today. 1. Refilled your nitro today  Labwork: Your physician recommends that you have lab work in: bmet/mag   Testing/Procedures: -None  Follow-Up: Your physician recommends that you keep your scheduled  follow-up appointment with Dr. [name]   Any Other Special Instructions Will Be Listed Below (If Applicable).     If you need a refill on your cardiac medications before your next appointment, please call your pharmacy.

## 2015-09-13 ENCOUNTER — Encounter: Payer: Self-pay | Admitting: Internal Medicine

## 2015-09-13 ENCOUNTER — Ambulatory Visit (INDEPENDENT_AMBULATORY_CARE_PROVIDER_SITE_OTHER): Payer: Medicaid Other | Admitting: Internal Medicine

## 2015-09-13 VITALS — BP 130/80 | HR 78 | Temp 98.6°F | Ht 66.0 in | Wt 208.0 lb

## 2015-09-13 DIAGNOSIS — E118 Type 2 diabetes mellitus with unspecified complications: Secondary | ICD-10-CM

## 2015-09-13 DIAGNOSIS — IMO0002 Reserved for concepts with insufficient information to code with codable children: Secondary | ICD-10-CM

## 2015-09-13 DIAGNOSIS — Z23 Encounter for immunization: Secondary | ICD-10-CM | POA: Diagnosis not present

## 2015-09-13 DIAGNOSIS — E1165 Type 2 diabetes mellitus with hyperglycemia: Secondary | ICD-10-CM | POA: Diagnosis not present

## 2015-09-13 DIAGNOSIS — I480 Paroxysmal atrial fibrillation: Secondary | ICD-10-CM

## 2015-09-13 DIAGNOSIS — D72819 Decreased white blood cell count, unspecified: Secondary | ICD-10-CM | POA: Diagnosis not present

## 2015-09-13 DIAGNOSIS — Z794 Long term (current) use of insulin: Secondary | ICD-10-CM

## 2015-09-13 NOTE — Assessment & Plan Note (Addendum)
Chronic leukopenia since 2009. Patient with borderline mild neutropenia with last ANC of 1.6 she had multiple reasons for possibly this chronic leukopenia which are bengin including her race, history of sarcoidosis, and keppra since 2012. HIV in 2013 was negative. No concern for B12 or folate deficiency as MCV is within normal limits. Other possible concerns would include copper deficiency and myelodysplastic disorders however patient would likely have significant pancytopenia.  - Will continue to follow

## 2015-09-13 NOTE — Assessment & Plan Note (Signed)
Discuss last A1c of 9.4 patient followed by endocrinology. Currently patient's blood sugars range from 150-180 per patient had been improving. Patient only eating 1 meal a day. - Diabetic diet and provided handout - We'll not change medication as this is managed by Endocrinology

## 2015-09-13 NOTE — Assessment & Plan Note (Addendum)
Recent recent hospitalization due to atrial fibrillation with RVR. Started on the dilitiazem during hospitalization. Also on [name]. Currently with regular rate and rhythm. - Follow up with cardiology yesterday and then a month from now -Will continue to follow

## 2015-09-13 NOTE — Progress Notes (Signed)
Patient ID: [name], female   DOB: [birth year], 65 y.o.   MRN: [id number]   [name] [name], MD Phone: [phone]  Reason For Visit: Follow-up hospital visit  Chronic leukopenia: Concerns of chronic leukopenia noted during hospital stay. Chart review noted that leukopenia is chronic in nature has been ongoing since 2009 patient has a history of sarcoidosis as well as being on seizure medication such as Keppra since 2012.   Diet patient is a diabetic. Endocrinology for diabetic medication management. Has had an increasing A1c over the past year. Discussed nutrition management with patient. She also has elevated triglycerides discussed decreasing fatty foods and fried foods.  Atrial fibrillation.  patient was seen in the hospital due to atrial fibrillation with RVR. Patient has been started on diltiazem. Follow-up in one month.    Past Medical History Reviewed problem list.  Medications- reviewed and updated No additions to family history Social history- patient is   Objective: BP 130/80 mmHg  Pulse 78  Temp(Src) 98.6 F (37 C) (Oral)  Ht  (1.676 m)  Wt 208 lb (94.348 kg)  BMI 33.59 kg/m2  SpO2 99% Gen: NAD, alert, cooperative with exam  Neck: FROM, supple CV: RRR, good S1/S2, no murmur, radial pulses 2+ Resp: CTABL, no wheezes, non-labored Abd: SNTND, BS present, no guarding or organomegaly Skin: no rashes no lesions  Assessment/Plan: See problem based a/p  PAF (paroxysmal atrial fibrillation) (HCC) Recent recent hospitalization due to atrial fibrillation with RVR. Started on the dilitiazem during hospitalization. Also on [name]. Currently with regular rate and rhythm. - Follow up with cardiology yesterday and then a month from now -Will continue to follow  Diabetes mellitus type 2, uncontrolled, with complications Discuss last A1c of 9.4 patient followed by endocrinology. Currently patient's blood sugars range from 150-180  per patient had been improving. Patient only eating 1 meal a day. - Diabetic diet and provided handout - We'll not change medication as this is managed by Endocrinology   Chronic leukopenia Chronic leukopenia since 2009. Patient with borderline mild neutropenia with last ANC of 1.6 she had multiple reasons for possibly this chronic leukopenia which are bengin including her race, history of sarcoidosis, and keppra since 2012. HIV in 2013 was negative. No concern for B12 or folate deficiency as MCV is within normal limits. Other possible concerns would include copper deficiency and myelodysplastic disorders however patient would likely have significant pancytopenia.  - Will continue to follow

## 2015-09-13 NOTE — Patient Instructions (Addendum)
  Diet Recommendations for Diabetes   Starchy (carb) foods: Bread, rice, pasta, potatoes, corn, cereal, grits, crackers, bagels, muffins, all baked goods.  (Fruits, milk, and yogurt also have carbohydrate, but most of these foods will not spike your blood sugar as the starchy foods will.)  A few fruits do cause high blood sugars; use small portions of bananas (limit to 1/2 at a time), grapes, watermelon, oranges, and most tropical fruits.    Protein foods: Meat, fish, poultry, eggs, dairy foods, and beans such as pinto and kidney beans (beans also provide carbohydrate).   1. Eat at least 3 meals and 1-2 snacks per day. Never go more than 4-5 hours while awake without eating. Eat breakfast within the first hour of getting up.   2. Limit starchy foods to TWO per meal and ONE per snack. ONE portion of a starchy  food is equal to the following:   - ONE slice of bread (or its equivalent, such as half of a hamburger bun).   - 1/2 cup of a "scoopable" starchy food such as potatoes or rice.   - 15 grams of carbohydrate as shown on food label.  3. Include at every meal: a protein food, a carb food, and vegetables and/or fruit.   - Obtain twice the volume of veg's as protein or carbohydrate foods for both lunch and dinner.   - Fresh or frozen veg's are best.   - Keep frozen veg's on hand for a quick vegetable serving.      Please only eat fried food's only about every month.

## 2015-09-30 NOTE — Progress Notes (Signed)
Patient ID: [name], female   DOB: 12/01/1950, 65 y.o.   MRN: [id number]   09/30/2015 [name] [facility]   02/06/1951  [id number]  Primary Physician Asiyah Angelene Giovanni, MD Primary Cardiologist: Dr. [name]    Reason for Visit/CC:   Atrial Fibrillation  HPI:  [name] is 65 y.o.  F with history of PAF on [name]/Xarelto, CKD stage III, normal coronaries in 2012 (normal nuc 09/2014), sarcoidosis of lung, asthma, HTN, HLD, DM, GERD, migraine, seizures, peripheral neuropathy who presented to [facility] on 08/22/15 with cough/URI symptoms, dizziness, and chest discomfort. She was found to be back in atrial fib w/ a RVR in the 120s. She was afebrile. Labwork notable for Cr 1.33 (similar to prior), WBC 3.1 (appears chronic), troponin 0.00. CXR without active disease. She was nontoxic appearing. Diltiazem drip was started with improvement in HR to 80s-90s. She reported compliance with meds except missed her AM [name] dose due to coming to the hospital. She was admitted for further evaluation and monitoring. Chest pain was felt musculoskeletal - despite constant discomfort, troponins remained neg x 4. Electrolytes were checked since she is on [name]. K was 4.7. Mg was 1.8 so she was given magnesium sulfate and started on oral MagOx. Thyroid function was normal. She had spontaneous conversion back to NSR on IV Cardizem. She was transitioned to 120 mg of Diltiazem CD and prescribed Mag Ox at time of discharge. It was recommended that she get a f/u Mg lab draw as well as f/u BMP.   Doing well with no apparent recurrence of afib.     Current Outpatient Prescriptions  Medication Sig Dispense Refill  . albuterol (PROVENTIL HFA;VENTOLIN HFA) 108 (90 BASE) MCG/ACT inhaler Inhale 2 puffs into the lungs every 6 (six) hours as needed for wheezing or shortness of breath. 1 Inhaler 1  . ARIPiprazole (ABILIFY) 15 MG tablet Take 15 mg by mouth at bedtime.  0  . atorvastatin (LIPITOR) 40 MG tablet Take 40 mg by  mouth daily at 6 PM.   0  . calcium carbonate (OS-CAL) 600 MG TABS tablet Take 600 mg by mouth daily.    . cholecalciferol (VITAMIN D) 1000 UNITS tablet Take 1,000 Units by mouth 2 (two) times daily.     . Cranberry 500 MG CAPS Take 500 mg by mouth 2 (two) times daily.    Marland Kitchen diltiazem (CARDIZEM CD) 120 MG 24 hr capsule Take 1 capsule (120 mg total) by mouth daily. 30 capsule 6  . dofetilide ([name]) 250 MCG capsule Take 1 capsule (250 mcg total) by mouth 2 (two) times daily. 180 capsule 4  . doxazosin (CARDURA) 4 MG tablet Take 4 mg by mouth at bedtime.     . DULoxetine (CYMBALTA) 60 MG capsule Take 60 mg by mouth at bedtime.     Marland Kitchen esomeprazole (NEXIUM) 40 MG capsule Take 40 mg by mouth 2 (two) times daily before a meal.    . EVZIO 0.4 MG/0.4ML SOAJ Inject 0.4 mLs as directed as needed (over dose).   0  . ferrous sulfate 325 (65 FE) MG tablet Take 325 mg by mouth daily with breakfast.    . Insulin Glargine (LANTUS SOLOSTAR) 100 UNIT/ML Solostar Pen Inject 30 Units into the skin at bedtime.    . lamoTRIgine (LAMICTAL) 100 MG tablet Take 100 mg by mouth at bedtime.  0  . levETIRAcetam (KEPPRA XR) 500 MG 24 hr tablet Take 4 tablets (2,000 mg total) by mouth at bedtime. 360 tablet  4  . loratadine (CLARITIN) 10 MG tablet Take 10 mg by mouth every morning.    . magnesium oxide (MAG-OX) 400 (241.3 Mg) MG tablet Take 1 tablet (400 mg total) by mouth daily. 30 tablet 3  . mometasone-formoterol (DULERA) 100-5 MCG/ACT AERO Inhale 2 puffs into the lungs every 12 (twelve) hours.    . nitroGLYCERIN (NITROSTAT) 0.4 MG SL tablet Place 1 tablet (0.4 mg total) under the tongue every 5 (five) minutes as needed for chest pain (do nto exceed 3 doses). 25 tablet 2  . NOVOLOG FLEXPEN 100 UNIT/ML FlexPen Inject 10 Units as directed 2 (two) times daily.   0  . ondansetron (ZOFRAN-ODT) 8 MG disintegrating tablet Take 8 mg by mouth every 6 (six) hours as needed for nausea.    . Oxcarbazepine (TRILEPTAL) 300 MG tablet Take  300 mg by mouth 2 (two) times daily.  0  . oxyCODONE-acetaminophen (PERCOCET) 10-325 MG tablet Take 1 tablet by mouth every 6 (six) hours. (scheduled)    . potassium chloride (K-DUR,KLOR-CON) 10 MEQ tablet Take 10 mEq by mouth daily.    . pregabalin (LYRICA) 100 MG capsule Take 100 mg by mouth 3 (three) times daily.    . vitamin C (ASCORBIC ACID) 500 MG tablet Take 500 mg by mouth 2 (two) times daily.    . VOLTAREN 1 % GEL Apply 2 g topically 4 (four) times daily as needed. For pain  0  . XARELTO 20 MG TABS tablet TAKE 1 TABLET BY MOUTH ONCE DAILY WITH SUPPER 30 tablet 6   No current facility-administered medications for this visit.    Allergies  Allergen Reactions  . Prednisone     SENT INTO AFIB   . Amitriptyline Other (See Comments)    Disoriented.  . Hydromorphone Hcl Other (See Comments)    blood pressure drops.    Social History   Social History  . Marital Status: Divorced    Spouse Name: N/A  . Number of Children: 3  . Years of Education: 12th   Occupational History  . disabled     CNA   Social History Main Topics  . Smoking status: Never Smoker   . Smokeless tobacco: Never Used  . Alcohol Use: No  . Drug Use: No  . Sexual Activity: Not Currently   Other Topics Concern  . Not on file   Social History Narrative   Pt. Lives at home with her daughter. She is single, has three children, does not work currently, and has a 12th grade education level. She has never used tobacco or alcohol. Quit using illicit drugs at the age of 28 and rarely ha caffeine.     Review of Systems: General: negative for chills, fever, night sweats or weight changes.  Cardiovascular: negative for chest pain, dyspnea on exertion, edema, orthopnea, palpitations, paroxysmal nocturnal dyspnea or shortness of breath Dermatological: negative for rash Respiratory: negative for cough or wheezing Urologic: negative for hematuria Abdominal: negative for nausea, vomiting, diarrhea, bright red  blood per rectum, melena, or hematemesis Neurologic: negative for visual changes, syncope, or dizziness All other systems reviewed and are otherwise negative except as noted above.    There were no vitals taken for this visit.  General appearance: alert, cooperative and no distress Neck: no carotid bruit and no JVD Lungs: clear to auscultation bilaterally Heart: regular rate and rhythm, S1, S2 normal, no murmur, click, rub or gallop Extremities: no LEE Pulses: 2+ and symmetric Skin: warm and dry Neurologic: Grossly normal  EKG  09/12/15   NSR with PVC. 75 bpm. QT/QTc stable at 404/451 ms.   ASSESSMENT AND PLAN:   1. PAF: Maint NSR   QT/QTc is stable at 404/451 ms. Continue [name] for rhythm control and diltiazem for rate control. . Continue Xarelto for anticoagulation.   2. URI: symptoms resolved.   3. HTN: BP is well controlled.   PLAN  Keep f/u with me in 6 months .   Charlton Haws

## 2015-10-07 ENCOUNTER — Encounter: Payer: Medicaid Other | Admitting: Cardiovascular Disease

## 2015-10-10 ENCOUNTER — Encounter: Payer: Self-pay | Admitting: Internal Medicine

## 2015-10-10 ENCOUNTER — Ambulatory Visit (INDEPENDENT_AMBULATORY_CARE_PROVIDER_SITE_OTHER): Payer: Medicaid Other | Admitting: Internal Medicine

## 2015-10-10 VITALS — BP 114/68 | HR 70 | Ht 66.0 in | Wt 212.0 lb

## 2015-10-10 DIAGNOSIS — D869 Sarcoidosis, unspecified: Secondary | ICD-10-CM | POA: Diagnosis not present

## 2015-10-10 DIAGNOSIS — J45991 Cough variant asthma: Secondary | ICD-10-CM

## 2015-10-10 DIAGNOSIS — J310 Chronic rhinitis: Secondary | ICD-10-CM | POA: Diagnosis not present

## 2015-10-10 MED ORDER — AMOXICILLIN-POT CLAVULANATE 875-125 MG PO TABS: 1.0000 | ORAL_TABLET | Freq: Two times a day (BID) | ORAL | Status: DC

## 2015-10-10 MED ORDER — MOMETASONE FURO-FORMOTEROL FUM 100-5 MCG/ACT IN AERO: 2.0000 | INHALATION_SPRAY | Freq: Two times a day (BID) | RESPIRATORY_TRACT | Status: DC

## 2015-10-10 NOTE — Assessment & Plan Note (Signed)
Mild flare > rec blow dulera out thru the nose for ICS benefit and augmentin x 10 days then prn sinus ct > ent eval

## 2015-10-10 NOTE — Patient Instructions (Addendum)
Change nexium to 40 mg Take 30-60 min before first meal of the day and add Pepcid 20 mg take an hour before bedtime  Augmentin 875 mg take one pill twice daily  X 10 days - take at breakfast and supper with large glass of water.  It would help reduce the usual side effects (diarrhea and yeast infections) if you ate cultured yogurt at lunch.  If not better you will need a sinus ct next - call Almyra Free at [phone] to schedule  Work on inhaler technique:  relax and gently blow all the way out then take a nice smooth deep breath back in, triggering the inhaler at same time you start breathing in.  Hold for up to 5 seconds if you can. Blow out thru nose. Rinse and gargle with water when done   If you are satisfied with your treatment plan,  let your doctor know and he/she can either refill your medications or you can return here when your prescription runs out.     If in any way you are not 100% satisfied,  please tell us.  If 100% better, tell your friends!  Pulmonary follow up is as needed

## 2015-10-10 NOTE — Assessment & Plan Note (Signed)
Complicated by hbp/ dm/ hyperlipidemia   Body mass index is 34.23 trending up  Lab Results  Component Value Date   TSH 2.486 08/22/2015     Contributing to gerd tendency/ doe/reviewed the need and the process to achieve and maintain neg calorie balance > defer f/u primary care including intermittently monitoring thyroid status

## 2015-10-10 NOTE — Assessment & Plan Note (Addendum)
-   Nl spirometry while symptomatic 09/23/2001    - Trial off flovent rec 09/24/2011 due to ? psuedoasthma -med calendar 10/09/2011 > did not follow - 07/09/2015    try reduce dulera from 200 to 100 bid   - 10/10/2015  extensive coaching HFA effectiveness =    90% from baseline near 0   Still unclear even in retrospect how much/many of her symptoms truly represent asthma but was doing better on dulera 100 2bid than off it so ok to resume   I had an extended discussion with the patient reviewing all relevant studies completed to date and  lasting 15 to 20 minutes of a 25 minute visit    Each maintenance medication was reviewed in detail including most importantly the difference between maintenance and prns and under what circumstances the prns are to be triggered using an action plan format that is not reflected in the computer generated alphabetically organized AVS.    Please see instructions for details which were reviewed in writing and the patient given a copy highlighting the part that I personally wrote and discussed at today's ov.   No pulmonary f/u needed

## 2015-10-10 NOTE — Progress Notes (Signed)
Subjective:    Patient ID: [name], female    DOB: [birth year]    MRN: [id number]  Brief patient profile:  26  yobf never smoker clinical dx with sarcoid here 1995 neg tbbx but classic cxr which gradually improved and  Off maint prednisone since around 2011  referred   to pulmonary clinic by Dr Christella Hartigan for cough.    History of Present Illness  05/24/2015 Acute OV  Last seen in office in 2013 .  Pt present for an acute office visit. Complains of chest tightness, dyspnea at times, and a dry cough. Sinus pressure and drainage, nausea at times x 1 week. 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  Augmentin  Twice daily  For 7 days  Mucinex DM Twice daily  As needed  Cough/congestion  Fluids and rest     07/09/2015  f/u ov/[name] re: cough  Chief Complaint  Patient presents with  . Follow-up    PFT done today. Pt c/o increased cough for the past 3 days- prod with yellow sputum. She also c/o increased DOE- gets winded walking approx 15 steps.   cough stopped p 7 days of augmentin then started back x 4 days prior to OV  When lies down / tends to keep her up  Dulera 200 started back up 2 weeks prior to OV  Did not help cough or breathing rec Nexium Take 30- 60 min before your first and last meals of the day . GERD  Diet  Change dulera to 100 Take 2 puffs first thing in am and then another 2 puffs about 12 hours later  Work on inhaler technique:   Augmentin 875 mg take one pill twice daily  X 10 days - take at breakfast and supper with large glass of water.  It would help reduce the usual side effects (diarrhea and yeast infections) if you ate cultured yogurt at lunch.     10/10/2015  f/u ov/[name] re: cough better on dulera and gerd rx = nexium 40 mg  Chief Complaint  Patient presents with  . Follow-up    Cough had worsened in Jan but has improved recently. Cough is occ prod  with minimal clear to yellow sputum.  She ran out of Dulera 1 wk ago and has been using albuterol 2 x daily on average.   doe = MMRC1 = can walk nl pace, flat grade, can't hurry or go uphills or steps s sob    After ran out of dulera took saba bid instead of the usual qd with walk Feels nasal congestin and slt purulent nasal discharge worse x 1 week also    No obvious day to day or daytime variability or assoc   cp or chest tightness, subjective wheeze or overt   hb symptoms. No unusual exp hx or h/o childhood pna/ asthma or knowledge of premature birth.  Sleeping ok without nocturnal  or early am exacerbation  of respiratory  c/o's or need for noct saba. Also denies any obvious fluctuation of symptoms with weather or environmental changes or other aggravating or alleviating factors except as outlined above   Current Medications, Allergies, Complete Past Medical History, Past Surgical History, Family History, and Social History were reviewed in [employer] record.  ROS  The following are not active complaints unless bolded sore throat, dysphagia, dental problems, itching, sneezing,  nasal congestion or  excess/ purulent secretions, ear ache,   fever, chills, sweats, unintended wt loss, classically pleuritic or exertional cp, hemoptysis,  orthopnea pnd or leg swelling, presyncope, palpitations, abdominal pain, anorexia, nausea, vomiting, diarrhea  or change in bowel or bladder habits, change in stools or urine, dysuria,hematuria,  rash, arthralgias, visual complaints, headache, numbness, weakness or ataxia or problems with walking or coordination,  change in mood/affect or memory.            Objective:   Physical Exam  Obese amb bf nad nasal tone to voice   10/10/2015       212  07/09/15 205 lb (92.987 kg)  06/28/15 205 lb (92.987 kg)  06/24/15 206 lb 4.8 oz (93.577 kg)    Vital signs reviewed    HEENT: nl dentition, turbinates, and orophanx. Nl external ear canals  without cough reflex   NECK :  without JVD/Nodes/TM/ nl carotid upstrokes bilaterally   LUNGS: Coarse BS w/ no wheezing    CV:  RRR  no s3 or murmur or increase in P2, no edema   ABD:  soft and nontender with nl excursion in the supine position. No bruits or organomegaly, bowel sounds nl  MS:  warm without deformities, calf tenderness, cyanosis or clubbing  SKIN: warm and dry without lesions    NEURO:  alert, approp, no deficits      I personally reviewed images and agree with radiology impression as follows:  CXR:   05/14/15 1. No active cardiopulmonary disease. 2. Stable symmetric mild hilar prominence from mild bilateral hilar adenopathy as seen on 08/02/2014 chest CT angiogram, likely due to known sarcoidosis.     Assessment & Plan:

## 2015-10-10 NOTE — Assessment & Plan Note (Signed)
-   Neg tbbx 1995  - no maint pred since 2011  - PFT's wnl 07/09/2015   No evidence active dz/ no need to f/u

## 2015-10-14 ENCOUNTER — Ambulatory Visit (INDEPENDENT_AMBULATORY_CARE_PROVIDER_SITE_OTHER): Payer: Medicaid Other | Admitting: Internal Medicine

## 2015-10-14 ENCOUNTER — Encounter: Payer: Self-pay | Admitting: Internal Medicine

## 2015-10-14 VITALS — BP 131/69 | HR 78 | Temp 98.0°F | Ht 66.0 in | Wt 212.3 lb

## 2015-10-14 DIAGNOSIS — J069 Acute upper respiratory infection, unspecified: Secondary | ICD-10-CM | POA: Diagnosis not present

## 2015-10-14 DIAGNOSIS — E1165 Type 2 diabetes mellitus with hyperglycemia: Secondary | ICD-10-CM | POA: Diagnosis not present

## 2015-10-14 DIAGNOSIS — Z794 Long term (current) use of insulin: Secondary | ICD-10-CM

## 2015-10-14 DIAGNOSIS — IMO0002 Reserved for concepts with insufficient information to code with codable children: Secondary | ICD-10-CM

## 2015-10-14 DIAGNOSIS — E118 Type 2 diabetes mellitus with unspecified complications: Secondary | ICD-10-CM

## 2015-10-14 LAB — POCT GLYCOSYLATED HEMOGLOBIN (HGB A1C): Hemoglobin A1C: 10.4

## 2015-10-14 MED ORDER — FLUTICASONE PROPIONATE 50 MCG/ACT NA SUSP: 2.0000 | Freq: Every day | NASAL | Status: DC

## 2015-10-14 NOTE — Progress Notes (Signed)
   [name] [name], MD Phone: [phone]  Reason For Visit: 3 month follow up/Sick visit    #URI: 1 week of congestion, cough, he is a little drainage. Patient received antibiotics from Dr. worked her pulmonologist on Friday. She has been taking these for the past 3 days and states that this has improved her symptoms. She is still having nasal congestion and would like something for this.  # Diabetic Nutrition: Continued discussion of diet education. We have discussed diet changes. Her current diet includes brown rice, chicken, sausages, vegetables including green. Patient also enjoys fruits for snacks and has limited the number of cookies she eats. She recently saw her endocrinologist in February and he increased her insulin.   Past Medical History Reviewed problem list.  Medications- reviewed and updated No additions to family history  Objective: BP 131/69 mmHg  Pulse 78  Temp(Src) 98 F (36.7 C) (Oral)  Ht  (1.676 m)  Wt 212 lb 4.8 oz (96.299 kg)  BMI 34.28 kg/m2 Gen: NAD, alert, cooperative with exam HEENT: Nasal congestion, erythematous turbinates, nasal drainage within the pharynx, normal buccal mucosa otherwise Neck: FROM, supple CV: RRR, good S1/S2, no murmur,  Resp: CTABL, no wheezes, non-labored  Assessment/Plan:   Upper respiratory infection 1 week of congestion cough and drainage.  - Patient received Augmentin from pulmonologist on Friday continue as prescribed\ - Provided Flonase for nasal congestion.  Diabetes mellitus type 2, uncontrolled, with complications A1C 10.4 today, 10/14/2015 Increased insulin per endocrinology at visit in February Continue to discuss diabetic nutrition Patient to continue eating high protein and low carb diet

## 2015-10-14 NOTE — Assessment & Plan Note (Addendum)
A1C 10.4 today, 10/14/2015 Increased insulin per endocrinology at visit in February Continue to discuss diabetic nutrition Patient to continue eating high protein and low carb diet

## 2015-10-14 NOTE — Assessment & Plan Note (Signed)
1 week of congestion cough and drainage.  - Patient received Augmentin from pulmonologist on Friday continue as prescribed\ - Provided Flonase for nasal congestion.

## 2015-10-14 NOTE — Patient Instructions (Addendum)
Please continue augmentin, please start take flonase once daily congestion. Please continue with diet. Doing a great job.

## 2015-10-15 ENCOUNTER — Telehealth: Payer: Self-pay | Admitting: Gastroenterology

## 2015-10-18 NOTE — Telephone Encounter (Signed)
Patient reports worsening GERD symptoms.  She reports that she is not longer taking a PPI.  She is advised that she needs to get back on her Nexium listed on her medication list.  She reports that her insurance will not pay for it.  She is advised to contact her primary care to send in an alternative that her insurance will cover.  She is advised that I will give her an appt with Dr. Christella HartiganJacobs for 12/17/15 11:00.  She is aware I will put her on his cancellation list and we will contact her if there is an earlier appt.

## 2015-10-29 NOTE — Progress Notes (Signed)
Patient ID: [name], female   DOB: 04-Dec-1950, 65 y.o.   MRN: [id number]   65 y.o. with  HTN and PAF On tikosyn. Hospitalization 5/15  with PAF converted with cardizem Xarelto started. Normal cath in 2012  Echo 5/24 normal EF and only mild LAE  Study Conclusions  - Left ventricle: The cavity size was normal. Wall thickness was increased in a pattern of mild LVH. Systolic function was normal. The estimated ejection fraction was in the range of 60% to 65%. Wall motion was normal; there were no regional wall motion abnormalities. Doppler parameters are consistent with abnormal left ventricular relaxation (grade 1 diastolic dysfunction). - Left atrium: The atrium was mildly dilated.  Doing well with no recurrent palpitations   Seizure disorder followed by neurology Last one also in 5/15 Keppra increased at that time  Discussed need to stay on xarelto with recurrent afib and [place] VASC score of 3    Seen at [facility] 12/15 with UTI and orthostasis Meds adjusted Rx with Ceftin  Less postural symptoms No PAF recurrence  Need new primary as Dr Parke Simmers not working Out and needs to see endocrinologist for DM  No off glucophage and byetta  Feeling better since off latuda   08/23/15  Had URI in hospital R/O PAF converted with cardizem  Still on tikosyn  Recent URI Rx with antibiotics BS up and insulin adjusted Some GERD Seeing GI next month Needs non generic nexium works better for her   ROS: Denies fever, malais, weight loss, blurry vision, decreased visual acuity, cough, sputum, SOB, hemoptysis, pleuritic pain, palpitaitons, heartburn, abdominal pain, melena, lower extremity edema, claudication, or rash.  All other systems reviewed and negative  General: Affect appropriate Black female ambulating with walker  HEENT: normal Neck supple with no adenopathy JVP normal no bruits no thyromegaly Lungs clear with no wheezing and good diaphragmatic motion Heart:  S1/S2 no murmur, no rub, gallop or  click PMI normal Abdomen: benighn, BS positve, no tenderness, no AAA no bruit.  No HSM or HJR Distal pulses intact with no bruits No edema Neuro non-focal Skin warm and dry No muscular weakness   Current Outpatient Prescriptions  Medication Sig Dispense Refill  . albuterol (PROVENTIL HFA;VENTOLIN HFA) 108 (90 BASE) MCG/ACT inhaler Inhale 2 puffs into the lungs every 6 (six) hours as needed for wheezing or shortness of breath. 1 Inhaler 1  . atorvastatin (LIPITOR) 40 MG tablet Take 40 mg by mouth daily at 6 PM.   0  . calcium carbonate (OS-CAL) 600 MG TABS tablet Take 600 mg by mouth daily.    . cholecalciferol (VITAMIN D) 1000 UNITS tablet Take 1,000 Units by mouth 2 (two) times daily.     . Cranberry 500 MG CAPS Take 500 mg by mouth 2 (two) times daily.    Marland Kitchen diltiazem (CARDIZEM CD) 120 MG 24 hr capsule Take 1 capsule (120 mg total) by mouth daily. 30 capsule 6  . dofetilide ([name]) 250 MCG capsule Take 1 capsule (250 mcg total) by mouth 2 (two) times daily. 180 capsule 4  . doxazosin (CARDURA) 4 MG tablet Take 4 mg by mouth at bedtime.     . DULoxetine (CYMBALTA) 60 MG capsule Take 60 mg by mouth at bedtime.     Marland Kitchen esomeprazole (NEXIUM) 40 MG capsule Take 40 mg by mouth 2 (two) times daily before a meal.    . EVZIO 0.4 MG/0.4ML SOAJ Inject 0.4 mLs as directed as directed.   0  .  ferrous sulfate 325 (65 FE) MG tablet Take 325 mg by mouth daily with breakfast.    . fluticasone (FLONASE) 50 MCG/ACT nasal spray Place 2 sprays into both nostrils daily. 16 g 6  . Insulin Glargine (LANTUS SOLOSTAR) 100 UNIT/ML Solostar Pen Inject 30 Units into the skin at bedtime.    . lamoTRIgine (LAMICTAL) 100 MG tablet Take 100 mg by mouth at bedtime.  0  . levETIRAcetam (KEPPRA XR) 500 MG 24 hr tablet Take 4 tablets (2,000 mg total) by mouth at bedtime. 360 tablet 4  . loratadine (CLARITIN) 10 MG tablet Take 10 mg by mouth every morning.    . magnesium oxide (MAG-OX) 400 (241.3 Mg) MG tablet Take 1  tablet (400 mg total) by mouth daily. 30 tablet 3  . mometasone-formoterol (DULERA) 100-5 MCG/ACT AERO Inhale 2 puffs into the lungs every 12 (twelve) hours. Reported on 10/10/2015 1 Inhaler 11  . nitroGLYCERIN (NITROSTAT) 0.4 MG SL tablet Place 1 tablet (0.4 mg total) under the tongue every 5 (five) minutes as needed for chest pain (do nto exceed 3 doses). 25 tablet 2  . NOVOLOG FLEXPEN 100 UNIT/ML FlexPen Inject 10 Units as directed 2 (two) times daily.   0  . ondansetron (ZOFRAN-ODT) 8 MG disintegrating tablet Take 8 mg by mouth every 6 (six) hours as needed for nausea.    Marland Kitchen. oxyCODONE-acetaminophen (PERCOCET) 10-325 MG tablet Take 1 tablet by mouth every 6 (six) hours. (scheduled)    . potassium chloride (K-DUR,KLOR-CON) 10 MEQ tablet Take 10 mEq by mouth daily.    . pregabalin (LYRICA) 100 MG capsule Take 100 mg by mouth 3 (three) times daily.    . vitamin C (ASCORBIC ACID) 500 MG tablet Take 500 mg by mouth 2 (two) times daily.    . VOLTAREN 1 % GEL Apply 2 g topically 4 (four) times daily as needed. For pain  0  . XARELTO 20 MG TABS tablet TAKE 1 TABLET BY MOUTH ONCE DAILY WITH SUPPER 30 tablet 6   No current facility-administered medications for this visit.    Allergies  Prednisone; Amitriptyline; and Hydromorphone hcl  Electrocardiogram:  5/15 SR rate 61 inferolateral T wave changes  QT 444   10/30/15  SR rate 70  Nonspecific ST changes QT 414   Assessment and Plan  PAF: maint NSR no palpitations continue xarelto and tikosyn DM:  Discussed low carb diet.  Target hemoglobin A1c is 6.5 or less.  Continue current medications. Seizures:  Quiescent continue Keppra Anticoagulation  On NOAC no bleeding issues Cr stable  Chol  Labs with primary Bipolar:  F/u behavioral health better with Abilify GERD:  F/u GI see if she can get non generic nexium          F/U with me 6 months

## 2015-10-30 ENCOUNTER — Ambulatory Visit (INDEPENDENT_AMBULATORY_CARE_PROVIDER_SITE_OTHER): Payer: Medicaid Other | Admitting: Cardiovascular Disease

## 2015-10-30 ENCOUNTER — Encounter: Payer: Self-pay | Admitting: Cardiovascular Disease

## 2015-10-30 VITALS — BP 124/70 | HR 73 | Ht 66.0 in | Wt 214.8 lb

## 2015-10-30 DIAGNOSIS — I10 Essential (primary) hypertension: Secondary | ICD-10-CM

## 2015-10-30 NOTE — Patient Instructions (Signed)

## 2015-11-02 ENCOUNTER — Other Ambulatory Visit: Payer: Self-pay | Admitting: Cardiovascular Disease

## 2015-11-26 ENCOUNTER — Other Ambulatory Visit: Payer: Self-pay | Admitting: Cardiovascular Disease

## 2015-12-17 ENCOUNTER — Telehealth: Payer: Self-pay | Admitting: Cardiovascular Disease

## 2015-12-17 ENCOUNTER — Ambulatory Visit (INDEPENDENT_AMBULATORY_CARE_PROVIDER_SITE_OTHER): Payer: Medicaid Other | Admitting: Gastroenterology

## 2015-12-17 ENCOUNTER — Encounter: Payer: Self-pay | Admitting: Gastroenterology

## 2015-12-17 VITALS — BP 130/80 | HR 88 | Ht 66.0 in | Wt 220.0 lb

## 2015-12-17 DIAGNOSIS — K219 Gastro-esophageal reflux disease without esophagitis: Secondary | ICD-10-CM

## 2015-12-17 MED ORDER — DILTIAZEM HCL ER COATED BEADS 120 MG PO CP24: 120.0000 mg | ORAL_CAPSULE | Freq: Every day | ORAL | Status: DC

## 2015-12-17 MED ORDER — ESOMEPRAZOLE MAGNESIUM 40 MG PO CPDR: 40.0000 mg | DELAYED_RELEASE_CAPSULE | Freq: Two times a day (BID) | ORAL | Status: DC

## 2015-12-17 NOTE — Telephone Encounter (Signed)
New message   Pt has refilled the prescription Diltiazem 30mg  2xday and pt noticed that they have changed the dosage   and the daily amount and she needs to know if it is the correct pill because it is now in pill form

## 2015-12-17 NOTE — Progress Notes (Signed)
Review of pertinent gastrointestinal problems:  1. chronic GERD. EGD February 2013 showed mild gastritis, H. pylori negative on biopsy. On proton pump inhibitor once daily and nightly Zantac  2. history of adenomatous polyps: Colonoscopy February 2013 found diverticulosis but no polyps. Recall colonoscopy at 5 year interval given 2008 adenomatous polyps  3. hoarseness, coughing, intermittent shortness of breath; not clear if this is related to her GERD but seems unlikely (5/13); was referred to ear nose and throat physician who ordered MBSS 2013: Clinical impression: Pt demonstrated appearance of a primary esophageal dysphagia. Oral and oropharyngeal function within normal limits with no aspiration or penetration. Esophageal sweep showed appearance of slow emptying of thin liquids though GE juntction. After about 4 oz consumption of liquid barium. Pt began coughing and gagging, expectorating barium tinged secretions. Pt attempted to consume puree and could not due to hard coughing and gagging. Pt would benefit from further assessment by GI. 02/2012 Esophageal manometry was completely normal.   HPI: This is a   very pleasant 65 year old woman whom I last saw just about 3 years ago  Chief complaint is GERD, pyrosis  Has gained 25 pounds in 3 years (same scale here in GI office) No dysphagia  She was doing well until her nexium was altered. She has been taking nexium once daily only and pepcid 1 hour before dinner.  On that regimen she still has significant pyrosis, acid regurg.  Past Medical History  Diagnosis Date  . Essential hypertension   . Hyperlipidemia   . Colon polyps   . Diverticulosis   . Gallstones   . IBS (irritable bowel syndrome)   . Kidney stones   . Paroxysmal a-fib (HCC)     a. On [name]. b. Recurrence 08/2014 in setting of GI illness.  . Depression   . Asthma     Dr. [name]  . Sarcoidosis of lung (HCC)     Dr. [name]  . Fibromyalgia   . Peripheral neuropathy (HCC)   .  Type II diabetes mellitus (HCC)   . H/O hiatal hernia   . GERD (gastroesophageal reflux disease)   . Migraine   . Epilepsy (HCC)   . Arthritis   . Chronic back pain   . Anxiety   . History of cardiac catheterization     a. Normal coronaries 2012. b. Normal nuc 09/2014.  . Seizures (HCC)   . CKD (chronic kidney disease), stage III     Past Surgical History  Procedure Laterality Date  . Closed reduction ankle fracture Left 10/2006  . Resection of hand neuroma Left 01/2002  . Neuroplasty / transposition median nerve at carpal tunnel Left 2004  . Esophageal manometry  03/21/2012    Procedure: ESOPHAGEAL MANOMETRY (EM);  Surgeon: [name], MD;  Location: [facility];  Service: Endoscopy;  Laterality: N/A;  . Colon surgery    . Anterior cervical decomp/discectomy fusion  07/11/2012    Procedure: ANTERIOR CERVICAL DECOMPRESSION/DISCECTOMY FUSION 1 LEVEL;  Surgeon: [name], MD;  Location: [facility];  Service: Neurosurgery;  Laterality: N/A;  Cervical Five-Six Anterior Cervical Decompression with Fusion Interbody Prothesis Plating and Bonegraft  . Cholecystectomy  1976  . Neuroplasty / transposition median nerve at carpal tunnel Right 2002  . Abdominal hysterectomy  1995  . Salpingoophorectomy Bilateral 2000  . Dilation and curettage of uterus    . Tubal ligation  1982  . Fracture surgery    . Cardiac catheterization  2012    Current Outpatient Prescriptions  Medication Sig Dispense Refill  . albuterol (PROVENTIL HFA;VENTOLIN HFA) 108 (90 BASE) MCG/ACT inhaler Inhale 2 puffs into the lungs every 6 (six) hours as needed for wheezing or shortness of breath. 1 Inhaler 1  . atorvastatin (LIPITOR) 40 MG tablet Take 40 mg by mouth daily at 6 PM.   0  . calcium carbonate (OS-CAL) 600 MG TABS tablet Take 600 mg by mouth daily.    . cholecalciferol (VITAMIN D) 1000 UNITS tablet Take 1,000 Units by mouth 2 (two) times daily.     . Cranberry 500 MG CAPS Take 500 mg by mouth 2  (two) times daily.    Marland Kitchen. diltiazem (CARDIZEM CD) 120 MG 24 hr capsule Take 1 capsule (120 mg total) by mouth daily. 30 capsule 6  . dofetilide ([name]) 250 MCG capsule take 1 capsule by mouth twice a day 180 capsule 3  . doxazosin (CARDURA) 4 MG tablet Take 4 mg by mouth at bedtime.     . DULoxetine (CYMBALTA) 60 MG capsule Take 60 mg by mouth at bedtime.     Marland Kitchen. esomeprazole (NEXIUM) 40 MG capsule Take 40 mg by mouth 2 (two) times daily before a meal.    . EVZIO 0.4 MG/0.4ML SOAJ Inject 0.4 mLs as directed as directed.   0  . ferrous sulfate 325 (65 FE) MG tablet Take 325 mg by mouth daily with breakfast.    . fluticasone (FLONASE) 50 MCG/ACT nasal spray Place 2 sprays into both nostrils daily. 16 g 6  . Insulin Glargine (LANTUS SOLOSTAR) 100 UNIT/ML Solostar Pen Inject 30 Units into the skin at bedtime.    . lamoTRIgine (LAMICTAL) 100 MG tablet Take 100 mg by mouth at bedtime.  0  . levETIRAcetam (KEPPRA XR) 500 MG 24 hr tablet Take 4 tablets (2,000 mg total) by mouth at bedtime. 360 tablet 4  . loratadine (CLARITIN) 10 MG tablet Take 10 mg by mouth every morning.    . magnesium oxide (MAG-OX) 400 (241.3 Mg) MG tablet Take 1 tablet (400 mg total) by mouth daily. 30 tablet 3  . mometasone-formoterol (DULERA) 100-5 MCG/ACT AERO Inhale 2 puffs into the lungs every 12 (twelve) hours. Reported on 10/10/2015 1 Inhaler 11  . nitroGLYCERIN (NITROSTAT) 0.4 MG SL tablet Place 1 tablet (0.4 mg total) under the tongue every 5 (five) minutes as needed for chest pain (do nto exceed 3 doses). 25 tablet 2  . NOVOLOG FLEXPEN 100 UNIT/ML FlexPen Inject 10 Units as directed 2 (two) times daily.   0  . ondansetron (ZOFRAN-ODT) 8 MG disintegrating tablet Take 8 mg by mouth every 6 (six) hours as needed for nausea.    Marland Kitchen. oxyCODONE-acetaminophen (PERCOCET) 10-325 MG tablet Take 1 tablet by mouth every 6 (six) hours. (scheduled)    . potassium chloride (K-DUR,KLOR-CON) 10 MEQ tablet Take 10 mEq by mouth daily.    .  vitamin C (ASCORBIC ACID) 500 MG tablet Take 500 mg by mouth 2 (two) times daily.    . VOLTAREN 1 % GEL Apply 2 g topically 4 (four) times daily as needed. For pain  0  . XARELTO 20 MG TABS tablet TAKE 1 TABLET BY MOUTH ONCE DAILY WITH SUPPER 30 tablet 6  . pregabalin (LYRICA) 100 MG capsule Take 100 mg by mouth 3 (three) times daily.     No current facility-administered medications for this visit.    Allergies as of 12/17/2015 - Review Complete 12/17/2015  Allergen Reaction Noted  . Prednisone  07/07/2012  . Amitriptyline Other (  See Comments) 08/25/2011  . Hydromorphone hcl Other (See Comments)     Family History  Problem Relation Age of Onset  . Heart attack Mother     @ age 48  . Mental illness Mother   . Diabetes Mother   . Hypertension Mother     siblings  . Alzheimer's disease Mother   . Depression Mother   . Hyperlipidemia Mother   . Heart attack Brother 50  . Alcohol abuse Brother   . Depression Brother   . Diabetes Brother   . Hyperlipidemia Brother   . Hypertension Brother   . Kidney disease Brother   . Drug abuse Brother   . Colon cancer Maternal Aunt   . Prostate cancer Maternal Grandfather   . Diabetes Maternal Grandfather   . Hyperlipidemia Maternal Grandfather   . Ovarian cancer Maternal Aunt   . Diabetes    . Hypertension    . Lupus Sister   . Alcohol abuse Sister   . Depression Sister   . Diabetes Sister   . Hyperlipidemia Sister   . Hypertension Sister   . Kidney disease Sister   . Drug abuse Sister   . Ovarian cancer Cousin   . Alcohol abuse Father   . Heart attack Father   . Hyperlipidemia Father   . Hypertension Father   . Diabetes Maternal Grandmother   . Hyperlipidemia Maternal Grandmother     Social History   Social History  . Marital Status: Divorced    Spouse Name: N/A  . Number of Children: 3  . Years of Education: 12th   Occupational History  . disabled     CNA   Social History Main Topics  . Smoking status: Never  Smoker   . Smokeless tobacco: Never Used  . Alcohol Use: No  . Drug Use: No  . Sexual Activity: Not Currently   Other Topics Concern  . Not on file   Social History Narrative   Pt. Lives at home with her daughter. She is single, has three children, does not work currently, and has a 12th grade education level. She has never used tobacco or alcohol. Quit using illicit drugs at the age of 51 and rarely ha caffeine.     Physical Exam: BP 130/80 mmHg  Pulse 88  Ht 5\' 6"  (1.676 m)  Wt 220 lb (99.791 kg)  BMI 35.53 kg/m2 Constitutional: generally well-appearing Psychiatric: alert and oriented x3 Abdomen: soft, nontender, nondistended, no obvious ascites, no peritoneal signs, normal bowel sounds   Assessment and plan: 65 y.o. female with GERD without alarm symptoms  She has gained 25 pounds the past 3 years and understands that this can be contributing to her GERD symptoms. She will do her best to try to cut back the weight. She is not taking her proton pump inhibitor or H2 blocker at the correct time and sign educated her on the correct times take these. Proton pump inhibitors should be taken 20-30 minutes before meal on a relatively empty stomach. She'll do that twice a day. H2 blockers are best if taken directly bedtime. She will change the way she is taking her Pepcid. She will call to report on her symptoms spots in 4-5 weeks and sooner if needed.   Rob Bunting, MD [facility] 12/17/2015, 10:34 AM

## 2015-12-17 NOTE — Telephone Encounter (Signed)
Patient is suppose to be on Diltiazem 120 mg daily. Sent in correct medication to patient's pharmacy. Informed patient to have pharmacy to dispose of the wrong prescription. Patient verbalized understanding.

## 2015-12-17 NOTE — Patient Instructions (Signed)
Nexium should be 40mg  pill, take 1 pill 20-30 min before BF and dinner meals (new script will be written today). Pepcid should be taken immediately at bedtime.   Call in 4-5 weeks to report on your response to these changes. Losing weight will also help your GERD control (you have gained 25 pounds in past 3 years).

## 2016-01-03 ENCOUNTER — Telehealth: Payer: Self-pay | Admitting: Cardiovascular Disease

## 2016-01-03 NOTE — Telephone Encounter (Signed)
ENw Message  Ptstated she is planning to have injections at her Pain clinic- wanted to discuss w/ RN first. Please call back and discuss.

## 2016-01-03 NOTE — Telephone Encounter (Signed)
Called patient back. Patient wanted to know if she could hold her xarelto for injections at the pain clinic. Requested patient to have pain clinic to contact our office to request orders to hold xarelto or not. Patient verbalized understanding and will call back with any other questions.

## 2016-01-16 ENCOUNTER — Ambulatory Visit (INDEPENDENT_AMBULATORY_CARE_PROVIDER_SITE_OTHER): Payer: Medicaid Other | Admitting: Internal Medicine

## 2016-01-16 ENCOUNTER — Encounter: Payer: Self-pay | Admitting: Internal Medicine

## 2016-01-16 ENCOUNTER — Other Ambulatory Visit (HOSPITAL_COMMUNITY)
Admission: RE | Admit: 2016-01-16 | Discharge: 2016-01-16 | Disposition: A | Discharge status: Home / Self Care | Payer: Medicaid Other | Source: Ambulatory Visit | Attending: Family Medicine | Admitting: Family Medicine

## 2016-01-16 VITALS — BP 128/88 | HR 91 | Temp 98.3°F | Ht 66.0 in | Wt 212.0 lb

## 2016-01-16 DIAGNOSIS — R1084 Generalized abdominal pain: Secondary | ICD-10-CM | POA: Diagnosis not present

## 2016-01-16 DIAGNOSIS — Z01419 Encounter for gynecological examination (general) (routine) without abnormal findings: Secondary | ICD-10-CM | POA: Diagnosis present

## 2016-01-16 DIAGNOSIS — R109 Unspecified abdominal pain: Secondary | ICD-10-CM | POA: Insufficient documentation

## 2016-01-16 DIAGNOSIS — E118 Type 2 diabetes mellitus with unspecified complications: Secondary | ICD-10-CM | POA: Diagnosis not present

## 2016-01-16 DIAGNOSIS — Z794 Long term (current) use of insulin: Secondary | ICD-10-CM | POA: Diagnosis not present

## 2016-01-16 DIAGNOSIS — Z124 Encounter for screening for malignant neoplasm of cervix: Secondary | ICD-10-CM

## 2016-01-16 DIAGNOSIS — IMO0002 Reserved for concepts with insufficient information to code with codable children: Secondary | ICD-10-CM

## 2016-01-16 DIAGNOSIS — E1165 Type 2 diabetes mellitus with hyperglycemia: Secondary | ICD-10-CM | POA: Diagnosis not present

## 2016-01-16 DIAGNOSIS — Z0000 Encounter for general adult medical examination without abnormal findings: Secondary | ICD-10-CM | POA: Diagnosis not present

## 2016-01-16 DIAGNOSIS — Z1151 Encounter for screening for human papillomavirus (HPV): Secondary | ICD-10-CM | POA: Insufficient documentation

## 2016-01-16 LAB — CBC
HEMATOCRIT: 40 % (ref 35.0–45.0)
HEMOGLOBIN: 13.7 g/dL (ref 11.7–15.5)
MCH: 29.5 pg (ref 27.0–33.0)
MCHC: 34.3 g/dL (ref 32.0–36.0)
MCV: 86 fL (ref 80.0–100.0)
MPV: 11 fL (ref 7.5–12.5)
Platelets: 186 10*3/uL (ref 140–400)
RBC: 4.65 MIL/uL (ref 3.80–5.10)
RDW: 14 % (ref 11.0–15.0)
WBC: 3.5 10*3/uL — AB (ref 3.8–10.8)

## 2016-01-16 LAB — POCT URINALYSIS DIPSTICK
Bilirubin, UA: NEGATIVE
GLUCOSE UA: NEGATIVE
Ketones, UA: NEGATIVE
Leukocytes, UA: NEGATIVE
NITRITE UA: NEGATIVE
PROTEIN UA: 30
RBC UA: NEGATIVE
Spec Grav, UA: >=1.03
UROBILINOGEN UA: 0.2
pH, UA: 5

## 2016-01-16 LAB — BASIC METABOLIC PANEL WITH GFR
BUN: 12 mg/dL (ref 7–25)
CHLORIDE: 103 mmol/L (ref 98–110)
CO2: 22 mmol/L (ref 20–31)
Calcium: 9.4 mg/dL (ref 8.6–10.4)
Creat: 1.32 mg/dL — ABNORMAL HIGH (ref 0.50–0.99)
GFR, EST NON AFRICAN AMERICAN: 43 mL/min — AB (ref >=60)
GFR, Est African American: 50 mL/min — ABNORMAL LOW (ref >=60)
GLUCOSE: 205 mg/dL — AB (ref 65–99)
POTASSIUM: 4.2 mmol/L (ref 3.5–5.3)
SODIUM: 138 mmol/L (ref 135–146)

## 2016-01-16 LAB — POCT UA - MICROSCOPIC ONLY

## 2016-01-16 LAB — POCT GLYCOSYLATED HEMOGLOBIN (HGB A1C): Hemoglobin A1C: 9

## 2016-01-16 LAB — LIPASE: Lipase: 51 U/L (ref 7–60)

## 2016-01-16 NOTE — Assessment & Plan Note (Signed)
Abdominal Pain. On exam suprapubic> than epigastric. Hx of weakness, pressure with urination, decreased appetite, no fever about a week ago. Most symptoms now resolved other than pressure with urination.  - Will check UA and Urine Culture for UTI  - Will get lipase - as patient noted pain was similar to the time patient has pancreatitis  - If UTI will treat with Keflex  - Will check CBC and BMET as well as this time

## 2016-01-16 NOTE — Progress Notes (Signed)
[name] is a 65 y.o. female presents to office today for annual physical exam examination.  Concerns today include:  1. Problem - States on the weekend of May 27-28th. She had 48 hours of feeling bad. She states she had abdominal pain. Some pressure associated with urination. She felt a bit weak, and warm. However denied having any fever at the time. She indicates feeling dizzy, she states she felt like hear "head was swimming."  Patient does indicated that it was likely hot in the house at the time because she does not have an air conditioner. She states she keeps well hydrated, drinking 8 bottles of water a day. During this episode, BP 144/80. Blood sugars were 242, 140. Patient felt better when she laid down. Denied any n/v. However felt that this was similar to hospitalization when she had pancreatitis. Patients appetite was okay during episodes.  Not palpation or chest pain.  No alcohol use. No new medications. Now all these symptoms have since resolved, though patient continues to have some pressure with urination  Last eye exam: Last year, 2016 - this year -early stage glacoma and catarcts  Last dental exam:  Last month, April 2017  Last colonoscopy: Will need repeat colonscopy in 2018, last in 2013  Last mammogram: Mammogram  Last pap smear: Last in 2009, patient with hx of hysterectomy with salpingectomy Immunizations needed: None  Refills needed today: No refills today   Past Medical History  Diagnosis Date  . Essential hypertension   . Hyperlipidemia   . Colon polyps   . Diverticulosis   . Gallstones   . IBS (irritable bowel syndrome)   . Kidney stones   . Paroxysmal a-fib (HCC)     a. On [name]. b. Recurrence 08/2014 in setting of GI illness.  . Depression   . Asthma     Dr. [name]  . Sarcoidosis of lung (HCC)     Dr. [name]  . Fibromyalgia   . Peripheral neuropathy (HCC)   . Type II diabetes mellitus (HCC)   . H/O hiatal hernia   . GERD (gastroesophageal reflux  disease)   . Migraine   . Epilepsy (HCC)   . Arthritis   . Chronic back pain   . Anxiety   . History of cardiac catheterization     a. Normal coronaries 2012. b. Normal nuc 09/2014.  . Seizures (HCC)   . CKD (chronic kidney disease), stage III    Social History   Social History  . Marital Status: Divorced    Spouse Name: N/A  . Number of Children: 3  . Years of Education: 12th   Occupational History  . disabled     CNA   Social History Main Topics  . Smoking status: Never Smoker   . Smokeless tobacco: Never Used  . Alcohol Use: No  . Drug Use: No  . Sexual Activity: Not Currently   Other Topics Concern  . Not on file   Social History Narrative   Pt. Lives at home with her daughter. She is single, has three children, does not work currently, and has a 12th grade education level. She has never used tobacco or alcohol. Quit using illicit drugs at the age of 61 and rarely ha caffeine.   Past Surgical History  Procedure Laterality Date  . Closed reduction ankle fracture Left 10/2006  . Resection of hand neuroma Left 01/2002  . Neuroplasty / transposition median nerve at carpal tunnel Left 2004  . Esophageal manometry  03/21/2012    Procedure: ESOPHAGEAL MANOMETRY (EM);  Surgeon: Rachael Feeaniel P Jacobs, MD;  Location: [facility];  Service: Endoscopy;  Laterality: N/A;  . Colon surgery    . Anterior cervical decomp/discectomy fusion  07/11/2012    Procedure: ANTERIOR CERVICAL DECOMPRESSION/DISCECTOMY FUSION 1 LEVEL;  Surgeon: Cristi LoronJeffrey D Jenkins, MD;  Location: [facility];  Service: Neurosurgery;  Laterality: N/A;  Cervical Five-Six Anterior Cervical Decompression with Fusion Interbody Prothesis Plating and Bonegraft  . Cholecystectomy  1976  . Neuroplasty / transposition median nerve at carpal tunnel Right 2002  . Abdominal hysterectomy  1995  . Salpingoophorectomy Bilateral 2000  . Dilation and curettage of uterus    . Tubal ligation  1982  . Fracture surgery    . Cardiac  catheterization  2012   Family History  Problem Relation Age of Onset  . Heart attack Mother     @ age 64  . Mental illness Mother   . Diabetes Mother   . Hypertension Mother     siblings  . Alzheimer's disease Mother   . Depression Mother   . Hyperlipidemia Mother   . Heart attack Brother 50  . Alcohol abuse Brother   . Depression Brother   . Diabetes Brother   . Hyperlipidemia Brother   . Hypertension Brother   . Kidney disease Brother   . Drug abuse Brother   . Colon cancer Maternal Aunt   . Prostate cancer Maternal Grandfather   . Diabetes Maternal Grandfather   . Hyperlipidemia Maternal Grandfather   . Ovarian cancer Maternal Aunt   . Diabetes    . Hypertension    . Lupus Sister   . Alcohol abuse Sister   . Depression Sister   . Diabetes Sister   . Hyperlipidemia Sister   . Hypertension Sister   . Kidney disease Sister   . Drug abuse Sister   . Ovarian cancer Cousin   . Alcohol abuse Father   . Heart attack Father   . Hyperlipidemia Father   . Hypertension Father   . Diabetes Maternal Grandmother   . Hyperlipidemia Maternal Grandmother     ROS: Review of Systems Review of Systems  Eyes: Negative for blurred vision.       Needs glass, has not got them yet  Respiratory: Negative for cough and shortness of breath.   Cardiovascular: Negative for chest pain, palpitations and leg swelling.  Gastrointestinal: Negative for nausea, vomiting, blood in stool and melena.  Genitourinary: Positive for urgency. Negative for dysuria and hematuria.  Neurological: Negative for focal weakness and headaches.  Psychiatric/Behavioral: Negative for depression and suicidal ideas.     Physical exam General appearance: alert and cooperative Head: Normocephalic, without obvious abnormality, atraumatic Eyes: conjunctivae/corneas clear. PERRL, EOM's intact. Fundi benign. Nose: Nares normal. Septum midline. Mucosa normal. No drainage or sinus tenderness. Throat: lips, mucosa,  and tongue normal; teeth and gums normal Neck: no adenopathy, no carotid bruit, no JVD, supple, symmetrical, trachea midline and thyroid not enlarged, symmetric, no tenderness/mass/nodules Lungs: clear to auscultation bilaterally Abdomen: soft, suprapubic pain and slight epigastric pain,  bowel sounds normal; no masses,  no organomegaly Pelvic: cervix normal in appearance, external genitalia normal and vagina normal without discharge Extremities: extremities normal, atraumatic, no cyanosis or edema Pulses: 2+ and symmetric Skin: Skin color, texture, turgor normal. No rashes or lesions Neurologic: Alert and oriented X 3, normal strength and tone. Normal symmetric reflexes. Normal coordination and gait    Assessment/ Plan: Patient was here for annual physical  exam.    Abdominal pain Abdominal Pain. On exam suprapubic> than epigastric. Hx of weakness, pressure with urination, decreased appetite, no fever about a week ago. Most symptoms now resolved other than pressure with urination.  - Will check UA and Urine Culture for UTI  - Will get lipase - as patient noted pain was similar to the time patient has pancreatitis  - If UTI will treat with Keflex  - Will check CBC and BMET as well as this time      [name] PGY-1, [facility]

## 2016-01-16 NOTE — Patient Instructions (Signed)
I was lovely to see you today. If you have symptoms similar to those described at today's visit in the future please come in to be checked out during the episode. I want to see you back in a month to follow up.

## 2016-01-17 ENCOUNTER — Telehealth: Payer: Self-pay | Admitting: Internal Medicine

## 2016-01-17 LAB — URINE CULTURE

## 2016-01-17 LAB — CYTOLOGY - PAP

## 2016-01-17 NOTE — Telephone Encounter (Signed)
Pt would like the doctor to write a letter stating that she should not be over heated. She doesn't have any A/C or any fans. She can take the letter to the [employer] for some assistance to purchase these items jw

## 2016-01-20 ENCOUNTER — Encounter: Payer: Self-pay | Admitting: Internal Medicine

## 2016-01-20 NOTE — Telephone Encounter (Signed)
Called [name] to discuss lab results with her. She also wanted a [facility] to get an [facility] from [employer]. At her last visit, she expressed dizziness and hot flashes associated with the temperature in her house. Therefore, I believe she has a medical indication for an AC.

## 2016-02-21 ENCOUNTER — Telehealth: Payer: Self-pay

## 2016-02-21 ENCOUNTER — Ambulatory Visit: Payer: Medicaid Other | Admitting: Internal Medicine

## 2016-02-21 NOTE — Telephone Encounter (Signed)
Prior auth for Dofetilide 250 mcg submitted to [employer]. It needs to be submitted to Pharmacy Review, since she has not tried and failed anything else. Will know outcome in 24- 48 hours.

## 2016-03-03 ENCOUNTER — Encounter: Payer: Self-pay | Admitting: Internal Medicine

## 2016-03-03 ENCOUNTER — Ambulatory Visit (INDEPENDENT_AMBULATORY_CARE_PROVIDER_SITE_OTHER): Payer: Medicaid Other | Admitting: Internal Medicine

## 2016-03-03 VITALS — BP 139/84 | HR 97 | Temp 97.9°F | Wt 206.0 lb

## 2016-03-03 DIAGNOSIS — E1165 Type 2 diabetes mellitus with hyperglycemia: Secondary | ICD-10-CM

## 2016-03-03 DIAGNOSIS — IMO0002 Reserved for concepts with insufficient information to code with codable children: Secondary | ICD-10-CM

## 2016-03-03 DIAGNOSIS — E118 Type 2 diabetes mellitus with unspecified complications: Secondary | ICD-10-CM | POA: Diagnosis not present

## 2016-03-03 DIAGNOSIS — Z794 Long term (current) use of insulin: Secondary | ICD-10-CM | POA: Diagnosis not present

## 2016-03-03 DIAGNOSIS — K589 Irritable bowel syndrome without diarrhea: Secondary | ICD-10-CM | POA: Insufficient documentation

## 2016-03-03 DIAGNOSIS — K582 Mixed irritable bowel syndrome: Secondary | ICD-10-CM | POA: Diagnosis not present

## 2016-03-03 LAB — POCT GLYCOSYLATED HEMOGLOBIN (HGB A1C): HEMOGLOBIN A1C: 8.6

## 2016-03-03 NOTE — Assessment & Plan Note (Addendum)
A1C 8.6 today, much improved from previous.  - Patient has been carb counting with daughter - Lantus is a sliding scale insulin regiment- takes Lantus 40-44 units  - Plans to follow up with Dr. Lyda PeroneKirk in the next month

## 2016-03-03 NOTE — Assessment & Plan Note (Addendum)
Normal abdominal exam. Reviewed colonoscopy from 2013, no issues. Patient states diarrhea that she is currently having similar to episodes she often gets with irritable bowel syndrome - Patient does not currently want any medication for diarrhea, states she has anti-diarrhea medication which works pretty well for patient. - Continue colace as needed for constipation

## 2016-03-03 NOTE — Progress Notes (Signed)
   [name] Toni CharsAsiyah Natilee Gauer, MD Phone: [phone]  Reason For Visit: 3 Month Follow up   Toni RosenthalLinda F [name] is 65 y.o. with hx significant for T2DM (followed by Dr. Lyda PeroneKirk), Bipolar Disorder (followed by psychiatry), seizure disorder (followed by neurology), PAF (Dr. Eden EmmsNishan).   # Patient with hx of irritable bowel syndrome, per diagnosis of GI several years ago. Patient has hx of constipation and diarrhea. Patient states currently with a couple days of diarrhea. However, this usually improves after a couple of days. Patient has colace and anti-diarrheal over the counter medications if she needs it. No abdominal pain, nausea or vomiting   DIABETES - Follow up with Dr. Lyda PeroneKirk, A1C has improved significantly. Patient states she has been following her daughter with about carb-counting. Patient has incentive to get better, wants to hang out with her grand-babies. Patient has been walking as well.  Disease Monitoring:   Blood Sugar ranges-  95- 160, Avg 150 Polyuria/phagia/dipsia- None    Visual problems- yes, in the morning, following with opthamologist  Medications: Insulin regiment, Lantus 40,  modified by Dr. Lyda PeroneKirk Compliance-  No issues,Hypoglycemic symptoms- None  Past Medical History Reviewed problem list.  Medications- reviewed and updated No additions to family history Social history- patient is a non- smoker  Objective: BP 139/84 mmHg  Pulse 97  Temp(Src) 97.9 F (36.6 C) (Oral)  Wt 206 lb (93.441 kg) Gen: NAD, alert, cooperative with exam CV: RRR, good S1/S2, no murmur, 2+ radial pulses  Resp: CTABL, no wheezes, non-labored Abd: SNTND, BS present, no guarding or organomegaly  Assessment/Plan: See problem based a/p  Irritable bowel syndrome Normal abdominal exam. Reviewed colonoscopy from 2013, no issues. Patient states diarrhea that she is currently having similar to episodes she often gets with irritable bowel syndrome - Patient does not currently want  any medication for diarrhea, states she has anti-diarrhea medication which works pretty well for patient. - Continue colace as needed for constipation   Diabetes mellitus type 2, uncontrolled, with complications A1C 8.6 today, much improved from previous.  - Patient has been carb counting with daughter and is using sliding scale insulin regiment- take Lantus 40-44 units  - Plans to follow up with Dr. Lyda PeroneKirk in the next month

## 2016-03-03 NOTE — Patient Instructions (Signed)
Everything looks great. Please follow up in the next 3 months.

## 2016-03-11 ENCOUNTER — Other Ambulatory Visit: Payer: Self-pay | Admitting: Physician Assistant

## 2016-03-20 ENCOUNTER — Encounter (HOSPITAL_COMMUNITY): Payer: Self-pay | Admitting: Emergency Medicine

## 2016-03-20 ENCOUNTER — Emergency Department (HOSPITAL_COMMUNITY)
Admission: EM | Admit: 2016-03-20 | Discharge: 2016-03-20 | Disposition: A | Discharge status: Home / Self Care | Payer: Medicaid Other | Attending: Emergency Medicine | Admitting: Emergency Medicine

## 2016-03-20 ENCOUNTER — Emergency Department (HOSPITAL_COMMUNITY): Payer: Medicaid Other

## 2016-03-20 DIAGNOSIS — I129 Hypertensive chronic kidney disease with stage 1 through stage 4 chronic kidney disease, or unspecified chronic kidney disease: Secondary | ICD-10-CM | POA: Diagnosis not present

## 2016-03-20 DIAGNOSIS — J45909 Unspecified asthma, uncomplicated: Secondary | ICD-10-CM | POA: Insufficient documentation

## 2016-03-20 DIAGNOSIS — F319 Bipolar disorder, unspecified: Secondary | ICD-10-CM | POA: Diagnosis not present

## 2016-03-20 DIAGNOSIS — R569 Unspecified convulsions: Secondary | ICD-10-CM

## 2016-03-20 DIAGNOSIS — R0789 Other chest pain: Secondary | ICD-10-CM | POA: Diagnosis present

## 2016-03-20 DIAGNOSIS — Z7901 Long term (current) use of anticoagulants: Secondary | ICD-10-CM | POA: Diagnosis not present

## 2016-03-20 DIAGNOSIS — Z79899 Other long term (current) drug therapy: Secondary | ICD-10-CM | POA: Insufficient documentation

## 2016-03-20 DIAGNOSIS — Z794 Long term (current) use of insulin: Secondary | ICD-10-CM | POA: Insufficient documentation

## 2016-03-20 DIAGNOSIS — N183 Chronic kidney disease, stage 3 (moderate): Secondary | ICD-10-CM | POA: Diagnosis not present

## 2016-03-20 DIAGNOSIS — J4540 Moderate persistent asthma, uncomplicated: Secondary | ICD-10-CM

## 2016-03-20 DIAGNOSIS — G40909 Epilepsy, unspecified, not intractable, without status epilepticus: Secondary | ICD-10-CM | POA: Diagnosis not present

## 2016-03-20 DIAGNOSIS — E1140 Type 2 diabetes mellitus with diabetic neuropathy, unspecified: Secondary | ICD-10-CM | POA: Diagnosis not present

## 2016-03-20 LAB — BASIC METABOLIC PANEL
Anion gap: 9 (ref 5–15)
BUN: 12 mg/dL (ref 6–20)
CO2: 23 mmol/L (ref 22–32)
CREATININE: 1.14 mg/dL — AB (ref 0.44–1.00)
Calcium: 9.9 mg/dL (ref 8.9–10.3)
Chloride: 108 mmol/L (ref 101–111)
GFR calc Af Amer: 58 mL/min — ABNORMAL LOW (ref >60)
GFR, EST NON AFRICAN AMERICAN: 50 mL/min — AB (ref >60)
GLUCOSE: 98 mg/dL (ref 65–99)
Potassium: 3.7 mmol/L (ref 3.5–5.1)
SODIUM: 140 mmol/L (ref 135–145)

## 2016-03-20 LAB — CBC
HCT: 39.3 % (ref 36.0–46.0)
Hemoglobin: 12.8 g/dL (ref 12.0–15.0)
MCH: 29.3 pg (ref 26.0–34.0)
MCHC: 32.6 g/dL (ref 30.0–36.0)
MCV: 89.9 fL (ref 78.0–100.0)
PLATELETS: 205 10*3/uL (ref 150–400)
RBC: 4.37 MIL/uL (ref 3.87–5.11)
RDW: 13.3 % (ref 11.5–15.5)
WBC: 5.1 10*3/uL (ref 4.0–10.5)

## 2016-03-20 LAB — URINALYSIS, ROUTINE W REFLEX MICROSCOPIC
BILIRUBIN URINE: NEGATIVE
GLUCOSE, UA: NEGATIVE mg/dL
HGB URINE DIPSTICK: NEGATIVE
Ketones, ur: NEGATIVE mg/dL
Leukocytes, UA: NEGATIVE
Nitrite: NEGATIVE
Protein, ur: NEGATIVE mg/dL
SPECIFIC GRAVITY, URINE: 1.017 (ref 1.005–1.030)
pH: 5 (ref 5.0–8.0)

## 2016-03-20 LAB — I-STAT TROPONIN, ED: Troponin i, poc: 0 ng/mL (ref 0.00–0.08)

## 2016-03-20 MED ORDER — IPRATROPIUM-ALBUTEROL 0.5-2.5 (3) MG/3ML IN SOLN
3.0000 mL | Freq: Once | RESPIRATORY_TRACT | Status: AC
  Administered 2016-03-20: 3 mL via RESPIRATORY_TRACT
  Filled 2016-03-20: qty 3

## 2016-03-20 MED ORDER — LEVETIRACETAM 500 MG PO TABS
1000.0000 mg | ORAL_TABLET | ORAL | Status: AC
  Administered 2016-03-20: 1000 mg via ORAL
  Filled 2016-03-20: qty 2

## 2016-03-20 MED ORDER — IPRATROPIUM BROMIDE 0.06 % NA SOLN
2.0000 | Freq: Two times a day (BID) | NASAL | Status: DC
  Administered 2016-03-20: 2 via NASAL
  Filled 2016-03-20: qty 15

## 2016-03-20 MED ORDER — IPRATROPIUM BROMIDE 0.03 % NA SOLN
2.0000 | Freq: Two times a day (BID) | NASAL | Status: DC
  Filled 2016-03-20: qty 30

## 2016-03-20 NOTE — Discharge Instructions (Signed)
I asked that you increase your Keppra to 500 mg in the morning and continue 2000 mg at bedtime.,  The next week.  Monitor your symptoms.  If you continue to have a a shoulder twitching, rapid eye blinking or a drawing sensation in your neck.  You need to see your neurologist for further evaluation.  As far as your asthma goes, I would use your inhaler on a regular basis for the next 3 days then as needed or per norm.  You've also been given Atrovent nasal spray.  Please use 2 sprays to each nostril twice a day for the next 5 days

## 2016-03-20 NOTE — ED Notes (Signed)
Patient Alert and oriented X4. Stable and ambulatory. Patient verbalized understanding of the discharge instructions.  Patient belongings were taken by the patient.  

## 2016-03-20 NOTE — ED Triage Notes (Signed)
Pt sts left sided CP worse with palpation and inspiraion

## 2016-03-20 NOTE — ED Provider Notes (Signed)
[facility] DEPT Provider Note   CSN: [id number] Arrival date & time: 03/20/16  5366  First Provider Contact:  First MD Initiated Contact with Patient 03/20/16 2004        History   Chief Complaint Chief Complaint  Patient presents with  . Chest Pain    HPI [name] is a 65 y.o. female.  This a 65 year old female with multiple medical problems who reports that for the past week and a half.  She's had chest discomfort, trouble breathing, worse over the last 3 days.  She also reports nasal congestion.  She states she's been using her inhaler without relief. She also states on 2 occasions in the past week.  She's had a period of dizziness, which she just did not feel like herself, felt like she was almost going to pass out but did not.  It lasted for minutes.  She does have a history of seizures.  She states she's been taking 2000 mg of Keppra daily.  She saw Dr. Dallie Piles her neurologist approximately 4 months ago with no change in her medication.       Past Medical History:  Diagnosis Date  . Anxiety   . Arthritis   . Asthma    Dr. [name]  . Chronic back pain   . CKD (chronic kidney disease), stage III   . Colon polyps   . Depression   . Diverticulosis   . Epilepsy (HCC)   . Essential hypertension   . Fibromyalgia   . Gallstones   . GERD (gastroesophageal reflux disease)   . H/O hiatal hernia   . History of cardiac catheterization    a. Normal coronaries 2012. b. Normal nuc 09/2014.  Marland Kitchen Hyperlipidemia   . IBS (irritable bowel syndrome)   . Kidney stones   . Migraine   . Paroxysmal a-fib (HCC)    a. On [name]. b. Recurrence 08/2014 in setting of GI illness.  . Peripheral neuropathy (HCC)   . Sarcoidosis of lung (HCC)    Dr. [name]  . Seizures (HCC)   . Type II diabetes mellitus [facility])     Patient Active Problem List   Diagnosis Date Noted  . Irritable bowel syndrome 03/03/2016  . Abdominal pain 01/16/2016  . Upper respiratory infection 10/14/2015  .  Rhinitis, chronic 10/10/2015  . Chronic leukopenia 09/13/2015  . PAF (paroxysmal atrial fibrillation) (HCC) 08/22/2015  . Breast nodule 07/26/2015  . Morbid obesity (HCC) 07/11/2015  . Bipolar I disorder (HCC) 05/22/2015  . Chronic kidney disease (CKD), stage III (moderate) 05/14/2015  . Chronic hip pain 05/14/2015  . Seizure disorder (HCC)   . History of diverticulitis   . Personal history of colonic polyps 09/01/2011  . Sarcoid (HCC) 09/01/2011  . Cough variant asthma 09/01/2011  . GERD (gastroesophageal reflux disease) 09/01/2011  . Obesity 09/01/2011  . 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 CHEST PAIN 09/26/2010  . Diabetes mellitus type 2, uncontrolled, with complications (HCC) 11/19/2008  . Hyperlipidemia 11/19/2008  . Essential hypertension 11/19/2008  . Cough 11/19/2008    Past Surgical History:  Procedure Laterality Date  . ABDOMINAL HYSTERECTOMY  1995  . ANTERIOR CERVICAL DECOMP/DISCECTOMY FUSION  07/11/2012   Procedure: ANTERIOR CERVICAL DECOMPRESSION/DISCECTOMY FUSION 1 LEVEL;  Surgeon: [name], MD;  Location: [facility];  Service: Neurosurgery;  Laterality: N/A;  Cervical Five-Six Anterior Cervical Decompression with Fusion Interbody Prothesis Plating and Bonegraft  . CARDIAC CATHETERIZATION  2012  . CHOLECYSTECTOMY  1976  . CLOSED REDUCTION ANKLE FRACTURE Left 10/2006  .  COLON SURGERY    . DILATION AND CURETTAGE OF UTERUS    . ESOPHAGEAL MANOMETRY  03/21/2012   Procedure: ESOPHAGEAL MANOMETRY (EM);  Surgeon: [name], MD;  Location: [facility];  Service: Endoscopy;  Laterality: N/A;  . FRACTURE SURGERY    . NEUROPLASTY / TRANSPOSITION MEDIAN NERVE AT CARPAL TUNNEL Left 2004  . NEUROPLASTY / TRANSPOSITION MEDIAN NERVE AT CARPAL TUNNEL Right 2002  . 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 SALPINGOOPHORECTOMY Bilateral 2000  . TUBAL LIGATION  1982    OB History    No data available       Home Medications    Prior to Admission medications     Medication Sig Start Date End Date Taking? Authorizing Provider  albuterol (PROVENTIL HFA;VENTOLIN HFA) 108 (90 BASE) MCG/ACT inhaler Inhale 2 puffs into the lungs every 6 (six) hours as needed for wheezing or shortness of breath. 05/27/15  Yes Asiyah Mayra Reel, MD  atorvastatin (LIPITOR) 40 MG tablet Take 40 mg by mouth daily at 6 PM.  04/07/15  Yes Historical Provider, MD  calcium carbonate (OS-CAL) 600 MG TABS tablet Take 600 mg by mouth daily.   Yes Historical Provider, MD  cholecalciferol (VITAMIN D) 1000 UNITS tablet Take 1,000 Units by mouth 2 (two) times daily.    Yes Historical Provider, MD  Cranberry 500 MG CAPS Take 500 mg by mouth 2 (two) times daily.   Yes Historical Provider, MD  diltiazem (CARDIZEM CD) 120 MG 24 hr capsule Take 1 capsule (120 mg total) by mouth daily. 12/17/15  Yes Wendall Stade, MD  dofetilide [facility]) 250 MCG capsule take 1 capsule by mouth twice a day 11/27/15  Yes Wendall Stade, MD  doxazosin (CARDURA) 4 MG tablet Take 4 mg by mouth at bedtime.    Yes Historical Provider, MD  DULoxetine (CYMBALTA) 60 MG capsule Take 60 mg by mouth at bedtime.    Yes Historical Provider, MD  esomeprazole (NEXIUM) 40 MG capsule Take 1 capsule (40 mg total) by mouth 2 (two) times daily before a meal. 12/17/15  Yes [name], MD  EVZIO 0.4 MG/0.4ML SOAJ Inject 0.4 mLs as directed daily as needed (over dose).  07/10/15  Yes Historical Provider, MD  ferrous sulfate 325 (65 FE) MG tablet Take 325 mg by mouth daily with breakfast.   Yes Historical Provider, MD  fluticasone (FLONASE) 50 MCG/ACT nasal spray Place 2 sprays into both nostrils daily. 10/14/15  Yes Asiyah Mayra Reel, MD  fluticasone (FLOVENT HFA) 110 MCG/ACT inhaler Inhale 2 puffs into the lungs 2 (two) times daily.   Yes Historical Provider, MD  Insulin Glargine (LANTUS SOLOSTAR) 100 UNIT/ML Solostar Pen Inject 30 Units into the skin at bedtime. Patient taking differently: Inject 40 Units into the skin at bedtime.   08/23/15  Yes Dayna N Dunn, PA-C  levETIRAcetam (KEPPRA XR) 500 MG 24 hr tablet Take 4 tablets (2,000 mg total) by mouth at bedtime. 06/11/15  Yes Suanne Marker, MD  loratadine (CLARITIN) 10 MG tablet Take 10 mg by mouth every morning.   Yes Historical Provider, MD  Magnesium Oxide 400 (240 Mg) MG TABS take 1 tablet by mouth once daily 03/12/16  Yes Dayna N Dunn, PA-C  mometasone-formoterol (DULERA) 200-5 MCG/ACT AERO Inhale 2 puffs into the lungs 2 (two) times daily.   Yes Historical Provider, MD  nitroGLYCERIN (NITROSTAT) 0.4 MG SL tablet Place 1 tablet (0.4 mg total) under the tongue every 5 (five) minutes as needed for  chest pain (do nto exceed 3 doses). 09/12/15  Yes Brittainy M Simmons, PA-C  NOVOLOG FLEXPEN 100 UNIT/ML FlexPen Inject 18-22 Units as directed 2 (two) times daily.  03/29/15  Yes Historical Provider, MD  ondansetron (ZOFRAN-ODT) 8 MG disintegrating tablet Take 8 mg by mouth every 6 (six) hours as needed for nausea.   Yes Historical Provider, MD  oxyCODONE-acetaminophen (PERCOCET) 10-325 MG tablet Take 1 tablet by mouth 5 (five) times daily. (scheduled)    Yes Historical Provider, MD  potassium chloride (K-DUR,KLOR-CON) 10 MEQ tablet Take 10 mEq by mouth daily.   Yes Historical Provider, MD  pregabalin (LYRICA) 100 MG capsule Take 100 mg by mouth 3 (three) times daily.   Yes Historical Provider, MD  vitamin C (ASCORBIC ACID) 500 MG tablet Take 500 mg by mouth 2 (two) times daily.   Yes Historical Provider, MD  VOLTAREN 1 % GEL Apply 2 g topically 4 (four) times daily as needed. For pain 08/13/14  Yes Historical Provider, MD  XARELTO 20 MG TABS tablet TAKE 1 TABLET BY MOUTH ONCE DAILY WITH SUPPER 08/22/15  Yes Wendall Stade, MD    Family History Family History  Problem Relation Age of Onset  . Heart attack Mother     @ age 22  . Mental illness Mother   . Diabetes Mother   . Hypertension Mother     siblings  . Alzheimer's disease Mother   . Depression Mother   .  Hyperlipidemia Mother   . Heart attack Brother 50  . Alcohol abuse Brother   . Depression Brother   . Diabetes Brother   . Hyperlipidemia Brother   . Hypertension Brother   . Kidney disease Brother   . Drug abuse Brother   . Colon cancer Maternal Aunt   . Prostate cancer Maternal Grandfather   . Diabetes Maternal Grandfather   . Hyperlipidemia Maternal Grandfather   . Ovarian cancer Maternal Aunt   . Diabetes    . Hypertension    . Lupus Sister   . Alcohol abuse Sister   . Depression Sister   . Diabetes Sister   . Hyperlipidemia Sister   . Hypertension Sister   . Kidney disease Sister   . Drug abuse Sister   . Ovarian cancer Cousin   . Alcohol abuse Father   . Heart attack Father   . Hyperlipidemia Father   . Hypertension Father   . Diabetes Maternal Grandmother   . Hyperlipidemia Maternal Grandmother     Social History Social History  Substance Use Topics  . Smoking status: Never Smoker  . Smokeless tobacco: Never Used  . Alcohol use No     Allergies   Prednisone; Amitriptyline; and Hydromorphone hcl   Review of Systems Review of Systems  Constitutional: Positive for activity change.  All other systems reviewed and are negative.    Physical Exam Updated Vital Signs BP 148/72   Pulse 60   Temp 97.6 F (36.4 C) (Oral)   Resp 18   Wt 90.7 kg   SpO2 97%   BMI 32.28 kg/m   Physical Exam  Constitutional: She appears well-developed and well-nourished.  HENT:  Head: Normocephalic.  Eyes: Pupils are equal, round, and reactive to light.  Neck: Normal range of motion.  Cardiovascular: Normal rate.   Pulmonary/Chest: Effort normal. No respiratory distress. She exhibits tenderness.  Abdominal: Soft.  Musculoskeletal: Normal range of motion.  Neurological: She is alert.  Skin: Skin is warm.  Psychiatric: She has a normal mood  and affect.  Nursing note and vitals reviewed.    ED Treatments / Results  Labs (all labs ordered are listed, but only  abnormal results are displayed) Labs Reviewed  BASIC METABOLIC PANEL - Abnormal; Notable for the following:       Result Value   Creatinine, Ser 1.14 (*)    GFR calc non Af Amer 50 (*)    GFR calc Af Amer 58 (*)    All other components within normal limits  CBC  URINALYSIS, ROUTINE W REFLEX MICROSCOPIC (NOT AT [facility])  LEVETIRACETAM LEVEL  I-STAT TROPOININ, ED    EKG  EKG Interpretation None       Radiology Dg Chest 2 View  Result Date: 03/20/2016 CLINICAL DATA:  Patient with left-sided chest pain radiating to the left arm for 2 days. Shortness of breath. Cough. EXAM: CHEST  2 VIEW COMPARISON:  Chest radiograph 08/22/2015 FINDINGS: Anterior cervical spinal fusion hardware. Normal cardiac and mediastinal contours. No consolidative pulmonary opacities. No pleural effusion or pneumothorax. Regional skeleton is unremarkable. IMPRESSION: No active cardiopulmonary disease. Electronically Signed   By: Annia Belt M.D.   On: 03/20/2016 19:25   Ct Head Wo Contrast  Result Date: 03/20/2016 CLINICAL DATA:  In seizure.  History of seizures per EXAM: CT HEAD WITHOUT CONTRAST TECHNIQUE: Contiguous axial images were obtained from the base of the skull through the vertex without intravenous contrast. COMPARISON:  Brain MRI 08/03/2014.  Head CT 10/16/2012 FINDINGS: Brain: No intracranial hemorrhage, mass effect, or midline shift. No hydrocephalus. The basilar cisterns are patent. No evidence of territorial infarct. No intracranial fluid collection. Vascular: No hyperdense vessel or abnormal calcification. Skull: Negative for fracture or focal lesion. Calvarium is intact. Sinuses/Orbits: Circumferential mucosal thickening of the maxillary sinuses with mucosal thickening of the ethmoid air cells and scattered throughout frontal sinus is pre Other: None. IMPRESSION: 1.  No acute intracranial abnormality. 2. Paranasal sinus disease, possibly acute. Electronically Signed   By: Rubye Oaks M.D.   On:  03/20/2016 22:15    Procedures Procedures (including critical care time)  Medications Ordered in ED Medications  ipratropium (ATROVENT) 0.06 % nasal spray 2 spray (2 sprays Each Nare Given 03/20/16 2040)  ipratropium-albuterol (DUONEB) 0.5-2.5 (3) MG/3ML nebulizer solution 3 mL (3 mLs Nebulization Given 03/20/16 2030)  levETIRAcetam (KEPPRA) tablet 1,000 mg (1,000 mg Oral Given 03/20/16 2117)     Initial Impression / Assessment and Plan / ED Course  I have reviewed the triage vital signs and the nursing notes.  Pertinent labs & imaging results that were available during my care of the patient were reviewed by me and considered in my medical decision making (see chart for details).  Clinical Course     Because of examination, patient developed right facial twitching, rapid eye blinking.  She was able to speak and comprehend through these episodes, but she did report that she was having a spasm on the left side of her neck that she felt she needed to stretch out.  She states this is what happened on the last 2 episodes where she had near syncope. I'm concerned that she is having breakthrough seizures.  I will give an extra dose of Keppra obtain a Keppra level and a head CT Head CT is normal.  Dr. [facility], neurology examined the patient.  He suggested adding 500 mg Keppra in the morning and keeping the 2000 mg at night he feels is just an acute viral illness.  That's lowering her seizure threshold.  I  spoken with the patient about discharging home.  She is in agreement.  She knows that if she continues having symptoms.  She is to see her neurologist  Final Clinical Impressions(s) / ED Diagnoses   Final diagnoses:  Atypical chest pain  Seizure (HCC)  Asthma, moderate persistent, uncomplicated    New Prescriptions New Prescriptions   No medications on file     Earley Favor, NP 03/20/16 2319    Earley Favor, NP 03/20/16 2324    Rolland Porter, MD 03/31/16 1102

## 2016-03-21 NOTE — Consult Note (Signed)
Neurology Consultation Reason for Consult: Abnormal spells Referring Physician: Genene Churn  CC: Abnormal spells  History is obtained from: Patient  HPI: [name] is a 65 y.o. female with a history of seizures since that grade. She had generalized convulsions at that time. She  Stopped having seizures in the ninth grade. She then began having episodes in 2012 again. She has had convulsive seizures, but she describes the most common semiology to be confusion, staring with preserved consciousness.  She describes that over the past couple of weeks she has been having a lot of chest congestion and feeling generally ill. She also describes that she has had a total of 3 episodes each lasting a few minutes over the past week concerning for possible seizures. The first episode happened while she was in church and she describes it as a sensation that she was unable to move or interact with anybody though she feels that her consciousness was preserved. She then felt like her head was pulling. This lasted for a few minutes and then subsequently resolved. He had a similar episode on Wednesday.  She then came into the hospital for chest congestion and while she was here was witnessed to have twitching of her face associated with frequent eye blinking and transient decreased responsiveness. This lasted for a few minutes. He was given a dose of Keppra.  She currently is back to her baseline. She complains of neck pain associated with these episodes.  ROS: A 14 point ROS was performed and is negative except as noted in the HPI.  Past Medical History:  Diagnosis Date  . Anxiety   . Arthritis   . Asthma    Dr. [name]  . Chronic back pain   . CKD (chronic kidney disease), stage III   . Colon polyps   . Depression   . Diverticulosis   . Epilepsy (HCC)   . Essential hypertension   . Fibromyalgia   . Gallstones   . GERD (gastroesophageal reflux disease)   . H/O hiatal hernia   . History of cardiac  catheterization    a. Normal coronaries 2012. b. Normal nuc 09/2014.  Marland Kitchen Hyperlipidemia   . IBS (irritable bowel syndrome)   . Kidney stones   . Migraine   . Paroxysmal a-fib (HCC)    a. On [name]. b. Recurrence 08/2014 in setting of GI illness.  . Peripheral neuropathy (HCC)   . Sarcoidosis of lung (HCC)    Dr. [name]  . Seizures (HCC)   . Type II diabetes mellitus (HCC)      Family History  Problem Relation Age of Onset  . Heart attack Mother     @ age 25  . Mental illness Mother   . Diabetes Mother   . Hypertension Mother     siblings  . Alzheimer's disease Mother   . Depression Mother   . Hyperlipidemia Mother   . Heart attack Brother 50  . Alcohol abuse Brother   . Depression Brother   . Diabetes Brother   . Hyperlipidemia Brother   . Hypertension Brother   . Kidney disease Brother   . Drug abuse Brother   . Colon cancer Maternal Aunt   . Prostate cancer Maternal Grandfather   . Diabetes Maternal Grandfather   . Hyperlipidemia Maternal Grandfather   . Ovarian cancer Maternal Aunt   . Diabetes    . Hypertension    . Lupus Sister   . Alcohol abuse Sister   . Depression Sister   .  Diabetes Sister   . Hyperlipidemia Sister   . Hypertension Sister   . Kidney disease Sister   . Drug abuse Sister   . Ovarian cancer Cousin   . Alcohol abuse Father   . Heart attack Father   . Hyperlipidemia Father   . Hypertension Father   . Diabetes Maternal Grandmother   . Hyperlipidemia Maternal Grandmother      Social History:  reports that she has never smoked. She has never used smokeless tobacco. She reports that she does not drink alcohol or use drugs.   Exam: Current vital signs: BP 134/80   Pulse 70   Temp 97.6 F (36.4 C) (Oral)   Resp 11   Wt 90.7 kg (200 lb)   SpO2 98%   BMI 32.28 kg/m  Vital signs in last 24 hours: Temp:  [97.6 F (36.4 C)] 97.6 F (36.4 C) (08/04 1852) Pulse Rate:  [56-70] 70 (08/04 2330) Resp:  [11-22] 11 (08/04 2315) BP:  (134-182)/(72-94) 134/80 (08/04 2330) SpO2:  [97 %-100 %] 98 % (08/04 2330) Weight:  [90.7 kg (200 lb)] 90.7 kg (200 lb) (08/04 2021)   Physical Exam  Constitutional: Appears well-developed and well-nourished.  Psych: Affect appropriate to situation Eyes: No scleral injection HENT: No OP obstrucion Head: Normocephalic.  Cardiovascular: Normal rate and regular rhythm.  Respiratory: Effort normal and breath sounds normal to anterior ascultation GI: Soft.  No distension. There is no tenderness.  Skin: WDI  Neuro: Mental Status: Patient is awake, alert, oriented to person, place, month, year, and situation. Patient is able to give a clear and coherent history. No signs of aphasia or neglect Cranial Nerves: II: Visual Fields are full. Pupils are equal, round, and reactive to light.   III,IV, VI: EOMI without ptosis or diploplia.  V: Facial sensation is symmetric to temperature VII: Facial movement is symmetric.  VIII: hearing is intact to voice X: Uvula elevates symmetrically XI: Shoulder shrug is symmetric. XII: tongue is midline without atrophy or fasciculations.  Motor: Tone is normal. Bulk is normal. 5/5 strength was present in Bilateral arms and right leg, left leg is limited by pain.(She describes that her leg has been sore.) Sensory: Sensation is symmetric to light touch and temperature in the arms and legs. Deep Tendon Reflexes: 2+ and symmetric in the biceps and patellae.  Plantars: Toes are downgoing bilaterally.  Cerebellar: FNF  without dysmetria bilaterally mild tremor on the left.   I have reviewed labs in epic and the results pertinent to this consultation are: BMP, UA unremarkable  I have reviewed the images obtained: CT head-no acute findings intracranially  Impression: 65 year old female with a history of seizures who presents with episodes concerning for possible partial seizures. I suspect that this represents lower seizure threshold in the setting of  acute illness, and would favor adding some additional Keppra. She is artery on a fairly large dose, so with add some a.m. Keppra to avoid peak toxicities rather than adding larger p.m. doses.  Recommendations: 1) increase Keppra to 2 g every afternoon, 500 mg every morning 2) continue Lyrica 100 mg 3 times a day 3) follow-up with Dr. Alain Marion, MD [facility] [phone]  If 7pm- 7am, please page neurology on call as listed in [facility].

## 2016-03-23 LAB — LEVETIRACETAM LEVEL: LEVETIRACETAM: 3.5 ug/mL — AB (ref 10.0–40.0)

## 2016-04-02 ENCOUNTER — Encounter: Payer: Self-pay | Admitting: Cardiovascular Disease

## 2016-04-16 ENCOUNTER — Encounter: Payer: Self-pay | Admitting: Internal Medicine

## 2016-04-16 ENCOUNTER — Ambulatory Visit (INDEPENDENT_AMBULATORY_CARE_PROVIDER_SITE_OTHER): Payer: Medicaid Other | Admitting: Internal Medicine

## 2016-04-16 VITALS — BP 137/89 | HR 53 | Temp 98.8°F | Ht 66.0 in | Wt 206.4 lb

## 2016-04-16 DIAGNOSIS — J310 Chronic rhinitis: Secondary | ICD-10-CM | POA: Diagnosis not present

## 2016-04-16 DIAGNOSIS — J0110 Acute frontal sinusitis, unspecified: Secondary | ICD-10-CM | POA: Diagnosis not present

## 2016-04-16 MED ORDER — AMOXICILLIN-POT CLAVULANATE 875-125 MG PO TABS: 1.0000 | ORAL_TABLET | Freq: Two times a day (BID) | ORAL | 0 refills | Status: DC

## 2016-04-16 NOTE — Progress Notes (Signed)
   [name] Toni CharsAsiyah Margerie Fraiser, MD Phone: [phone]  Reason For Visit: Cough/Congestion   # 1 month history of a cough and congestion. Patient states that she was seen on August 4 in the ED for this and received a breathing treatment. Following the breathing treatment she seemed to improve. Then her symptoms again worsened about 1 week ago. Patient has yellow-green phlegm now.  Coughing all the times from the nasal drainage which has been irritating her asthma.  No wheezing. Has headache and jaw pain. Nasal congestion. Indicates having chills. No fever in the office today. Patient has been using her inhalers including her Dulera, Atrovent and Albuterol. Patient is allergic to prednisone as it sends her into Afib.  Past Medical History Reviewed problem list.  Medications- reviewed and updated No additions to family history Social history- patient is a non smoker  Objective: BP 137/89   Pulse (!) 53   Temp 98.8 F (37.1 C) (Oral)   Ht 5\' 6"  (1.676 m)   Wt 206 lb 6.4 oz (93.6 kg)   BMI 33.31 kg/m  RR: 14 Gen: NAD, alert, cooperative with exam HEENT: Normal     Head: Maxillary and Ethmoid sinus tenderness     Neck: No masses palpated. No lymphadenopathy    Ears: Tympanic membranes intact, normal light reflex, no erythema, no bulging    Nose: boggy nasal turbinates    Throat: sinus drainage along the back of her throat Pulm: No signs of respiratory distress. clear to auscultation bilaterally, no wheezes, rhonchi or rales  Assessment/Plan: See problem based a/p  Rhinitis, chronic 1 month of hx of sinusitis. Patient with purulent discharge, headache, jaw pain, and chills. Concern for bacterial component. Therefore will treat patient with Augmentin. Patient was previously for Dr. Sherene SiresWert for chronic sinusitis , and per patient he is to do further work up for her chronic sinus infections. States that this sinus infection irritants her asthma, however patient can not  taking prednisone. Therefore, will treat sinus infection as this likely the cause of patient's cough and congestion.

## 2016-04-16 NOTE — Patient Instructions (Signed)
Please take Augmentin for 10 days. Follow up with me if no improvement. Let me know if you need diflucan for yeast infection. Please follow up with Dr. Sherene SiresWert for sinuses

## 2016-04-16 NOTE — Assessment & Plan Note (Signed)
1 month of hx of sinusitis. Patient with purulent discharge, headache, jaw pain, and chills. Concern for bacterial component. Therefore will treat patient with Augmentin. Patient was previously for Dr. Sherene SiresWert for chronic sinusitis , and per patient he is to do further work up for her chronic sinus infections. States that this sinus infection irritants her asthma, however patient can not taking prednisone. Therefore, will treat sinus infection as this likely the cause of patient's cough and congestion.

## 2016-04-18 ENCOUNTER — Other Ambulatory Visit: Payer: Self-pay | Admitting: Cardiovascular Disease

## 2016-04-20 ENCOUNTER — Encounter (HOSPITAL_COMMUNITY): Payer: Self-pay | Admitting: Emergency Medicine

## 2016-04-20 ENCOUNTER — Emergency Department (HOSPITAL_COMMUNITY): Payer: Medicaid Other

## 2016-04-20 ENCOUNTER — Emergency Department (HOSPITAL_COMMUNITY)
Admission: EM | Admit: 2016-04-20 | Discharge: 2016-04-20 | Disposition: A | Discharge status: Home / Self Care | Payer: Medicaid Other | Attending: Emergency Medicine | Admitting: Emergency Medicine

## 2016-04-20 DIAGNOSIS — E1122 Type 2 diabetes mellitus with diabetic chronic kidney disease: Secondary | ICD-10-CM | POA: Insufficient documentation

## 2016-04-20 DIAGNOSIS — I129 Hypertensive chronic kidney disease with stage 1 through stage 4 chronic kidney disease, or unspecified chronic kidney disease: Secondary | ICD-10-CM | POA: Insufficient documentation

## 2016-04-20 DIAGNOSIS — N183 Chronic kidney disease, stage 3 (moderate): Secondary | ICD-10-CM | POA: Diagnosis not present

## 2016-04-20 DIAGNOSIS — E1140 Type 2 diabetes mellitus with diabetic neuropathy, unspecified: Secondary | ICD-10-CM | POA: Diagnosis not present

## 2016-04-20 DIAGNOSIS — Z79899 Other long term (current) drug therapy: Secondary | ICD-10-CM | POA: Insufficient documentation

## 2016-04-20 DIAGNOSIS — R509 Fever, unspecified: Secondary | ICD-10-CM | POA: Diagnosis present

## 2016-04-20 DIAGNOSIS — J45909 Unspecified asthma, uncomplicated: Secondary | ICD-10-CM | POA: Insufficient documentation

## 2016-04-20 DIAGNOSIS — Z794 Long term (current) use of insulin: Secondary | ICD-10-CM | POA: Diagnosis not present

## 2016-04-20 DIAGNOSIS — B349 Viral infection, unspecified: Secondary | ICD-10-CM | POA: Insufficient documentation

## 2016-04-20 LAB — COMPREHENSIVE METABOLIC PANEL
ALT: 19 U/L (ref 14–54)
AST: 26 U/L (ref 15–41)
Albumin: 4.2 g/dL (ref 3.5–5.0)
Alkaline Phosphatase: 94 U/L (ref 38–126)
Anion gap: 8 (ref 5–15)
BUN: 16 mg/dL (ref 6–20)
CHLORIDE: 110 mmol/L (ref 101–111)
CO2: 22 mmol/L (ref 22–32)
CREATININE: 1.71 mg/dL — AB (ref 0.44–1.00)
Calcium: 9.4 mg/dL (ref 8.9–10.3)
GFR calc Af Amer: 35 mL/min — ABNORMAL LOW (ref >60)
GFR, EST NON AFRICAN AMERICAN: 30 mL/min — AB (ref >60)
Glucose, Bld: 79 mg/dL (ref 65–99)
Potassium: 4.6 mmol/L (ref 3.5–5.1)
Sodium: 140 mmol/L (ref 135–145)
Total Bilirubin: 0.5 mg/dL (ref 0.3–1.2)
Total Protein: 7.6 g/dL (ref 6.5–8.1)

## 2016-04-20 LAB — URINALYSIS, ROUTINE W REFLEX MICROSCOPIC
BILIRUBIN URINE: NEGATIVE
GLUCOSE, UA: NEGATIVE mg/dL
HGB URINE DIPSTICK: NEGATIVE
KETONES UR: NEGATIVE mg/dL
Nitrite: NEGATIVE
PROTEIN: NEGATIVE mg/dL
Specific Gravity, Urine: 1.021 (ref 1.005–1.030)
pH: 5 (ref 5.0–8.0)

## 2016-04-20 LAB — URINE MICROSCOPIC-ADD ON

## 2016-04-20 LAB — CBC
HCT: 43.6 % (ref 36.0–46.0)
Hemoglobin: 14.2 g/dL (ref 12.0–15.0)
MCH: 29.6 pg (ref 26.0–34.0)
MCHC: 32.6 g/dL (ref 30.0–36.0)
MCV: 90.8 fL (ref 78.0–100.0)
PLATELETS: 179 10*3/uL (ref 150–400)
RBC: 4.8 MIL/uL (ref 3.87–5.11)
RDW: 12.9 % (ref 11.5–15.5)
WBC: 4.1 10*3/uL (ref 4.0–10.5)

## 2016-04-20 LAB — I-STAT CG4 LACTIC ACID, ED
Lactic Acid, Venous: 2.19 mmol/L (ref 0.5–1.9)
Lactic Acid, Venous: 1.11 mmol/L (ref 0.5–1.9)

## 2016-04-20 MED ORDER — SODIUM CHLORIDE 0.9 % IV BOLUS (SEPSIS)
1000.0000 mL | Freq: Once | INTRAVENOUS | Status: AC
  Administered 2016-04-20: 1000 mL via INTRAVENOUS

## 2016-04-20 MED ORDER — IPRATROPIUM BROMIDE 0.02 % IN SOLN
0.5000 mg | Freq: Once | RESPIRATORY_TRACT | Status: AC
  Administered 2016-04-20: 0.5 mg via RESPIRATORY_TRACT
  Filled 2016-04-20: qty 2.5

## 2016-04-20 MED ORDER — ACETAMINOPHEN 325 MG PO TABS
650.0000 mg | ORAL_TABLET | Freq: Once | ORAL | Status: AC
  Administered 2016-04-20: 650 mg via ORAL
  Filled 2016-04-20: qty 2

## 2016-04-20 MED ORDER — ALBUTEROL SULFATE (2.5 MG/3ML) 0.083% IN NEBU
5.0000 mg | INHALATION_SOLUTION | Freq: Once | RESPIRATORY_TRACT | Status: AC
  Administered 2016-04-20: 5 mg via RESPIRATORY_TRACT
  Filled 2016-04-20: qty 6

## 2016-04-20 MED ORDER — FENTANYL CITRATE (PF) 100 MCG/2ML IJ SOLN
50.0000 ug | Freq: Once | INTRAMUSCULAR | Status: AC
  Administered 2016-04-20: 50 ug via INTRAVENOUS
  Filled 2016-04-20: qty 2

## 2016-04-20 MED ORDER — ONDANSETRON HCL 4 MG/2ML IJ SOLN
4.0000 mg | Freq: Once | INTRAMUSCULAR | Status: AC
  Administered 2016-04-20: 4 mg via INTRAVENOUS
  Filled 2016-04-20: qty 2

## 2016-04-20 MED ORDER — ONDANSETRON 4 MG PO TBDP: 4.0000 mg | ORAL_TABLET | Freq: Three times a day (TID) | ORAL | 0 refills | Status: DC | PRN

## 2016-04-20 NOTE — ED Notes (Signed)
Pt provided a [place] sandwich and sprite zero for the rd

## 2016-04-20 NOTE — ED Provider Notes (Signed)
[facility]ENCY DEPT Provider Note   CSN: [id number] Arrival date & time: 04/20/16  1329   History   Chief Complaint Chief Complaint  Patient presents with  . Nausea  . Fever    HPI [name] is a 65 y.o. female.  Patient with multiple medical problems presents with complaint of cough, shortness of breath, chest pain, nasal congestion, fatigue, nausea over the past 1 week, subjective fever and chills. Patient states that she saw her primary care physician 4 days ago and was started on an antibiotic. She is unsure of the name of the antibiotic. She has had continued symptoms. No ear pain or sore throat although her throat has been "scratchy". No wheezing. No vomiting or diarrhea. No urinary symptoms. No skin rashes or tick bites. No muscle aches or joint swelling. No other treatments prior to arrival. Onset of some symptoms. Patient has been compliant with Xarelto for A. Fib. Denies history of blood clots, calf tenderness, other PE risk factors.      Past Medical History:  Diagnosis Date  . Anxiety   . Arthritis   . Asthma    Dr. [name]  . Chronic back pain   . CKD (chronic kidney disease), stage III   . Colon polyps   . Depression   . Diverticulosis   . Epilepsy (HCC)   . Essential hypertension   . Fibromyalgia   . Gallstones   . GERD (gastroesophageal reflux disease)   . H/O hiatal hernia   . History of cardiac catheterization    a. Normal coronaries 2012. b. Normal nuc 09/2014.  Marland Kitchen Hyperlipidemia   . IBS (irritable bowel syndrome)   . Kidney stones   . Migraine   . Paroxysmal a-fib (HCC)    a. On [name]. b. Recurrence 08/2014 in setting of GI illness.  . Peripheral neuropathy (HCC)   . Sarcoidosis of lung (HCC)    Dr. [name]  . Seizures (HCC)   . Type II diabetes mellitus [facility])     Patient Active Problem List   Diagnosis Date Noted  . Irritable bowel syndrome 03/03/2016  . Abdominal pain 01/16/2016  . Upper respiratory infection 10/14/2015  . Rhinitis,  chronic 10/10/2015  . Chronic leukopenia 09/13/2015  . PAF (paroxysmal atrial fibrillation) (HCC) 08/22/2015  . Breast nodule 07/26/2015  . Morbid obesity (HCC) 07/11/2015  . Bipolar I disorder (HCC) 05/22/2015  . Chronic kidney disease (CKD), stage III (moderate) 05/14/2015  . Chronic hip pain 05/14/2015  . Seizure disorder (HCC)   . History of diverticulitis   . Personal history of colonic polyps 09/01/2011  . Sarcoid (HCC) 09/01/2011  . Cough variant asthma 09/01/2011  . GERD (gastroesophageal reflux disease) 09/01/2011  . Obesity 09/01/2011  . 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 CHEST PAIN 09/26/2010  . Diabetes mellitus type 2, uncontrolled, with complications (HCC) 11/19/2008  . Hyperlipidemia 11/19/2008  . Essential hypertension 11/19/2008  . Cough 11/19/2008    Past Surgical History:  Procedure Laterality Date  . ABDOMINAL HYSTERECTOMY  1995  . ANTERIOR CERVICAL DECOMP/DISCECTOMY FUSION  07/11/2012   Procedure: ANTERIOR CERVICAL DECOMPRESSION/DISCECTOMY FUSION 1 LEVEL;  Surgeon: [name], MD;  Location: [facility];  Service: Neurosurgery;  Laterality: N/A;  Cervical Five-Six Anterior Cervical Decompression with Fusion Interbody Prothesis Plating and Bonegraft  . CARDIAC CATHETERIZATION  2012  . CHOLECYSTECTOMY  1976  . CLOSED REDUCTION ANKLE FRACTURE Left 10/2006  . COLON SURGERY    . DILATION AND CURETTAGE OF UTERUS    . ESOPHAGEAL MANOMETRY  03/21/2012   Procedure: ESOPHAGEAL MANOMETRY (EM);  Surgeon: [name], MD;  Location: [facility] END[facility];  Service: Endoscopy;  Laterality: N/A;  . FRACTURE SURGERY    . NEUROPLASTY / TRANSPOSITION MEDIAN NERVE AT CARPAL TUNNEL Left 2004  . NEUROPLASTY / TRANSPOSITION MEDIAN NERVE AT CARPAL TUNNEL Right 2002  . 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 SALPINGOOPHORECTOMY Bilateral 2000  . TUBAL LIGATION  1982    OB History    No data available       Home Medications    Prior to Admission medications   Medication  Sig Start Date End Date Taking? Authorizing Provider  albuterol (PROVENTIL HFA;VENTOLIN HFA) 108 (90 BASE) MCG/ACT inhaler Inhale 2 puffs into the lungs every 6 (six) hours as needed for wheezing or shortness of breath. 05/27/15   Asiyah Mayra Reel, MD  amoxicillin-clavulanate (AUGMENTIN) 875-125 MG tablet Take 1 tablet by mouth 2 (two) times daily. 04/16/16   Asiyah Mayra Reel, MD  atorvastatin (LIPITOR) 40 MG tablet Take 40 mg by mouth daily at 6 PM.  04/07/15   Historical Provider, MD  calcium carbonate (OS-CAL) 600 MG TABS tablet Take 600 mg by mouth daily.    Historical Provider, MD  cholecalciferol (VITAMIN D) 1000 UNITS tablet Take 1,000 Units by mouth 2 (two) times daily.     Historical Provider, MD  Cranberry 500 MG CAPS Take 500 mg by mouth 2 (two) times daily.    Historical Provider, MD  diltiazem (CARDIZEM CD) 120 MG 24 hr capsule Take 1 capsule (120 mg total) by mouth daily. 12/17/15   Wendall Stade, MD  dofetilide [facility]) 250 MCG capsule take 1 capsule by mouth twice a day 11/27/15   Wendall Stade, MD  doxazosin (CARDURA) 4 MG tablet Take 4 mg by mouth at bedtime.     Historical Provider, MD  DULoxetine (CYMBALTA) 60 MG capsule Take 60 mg by mouth at bedtime.     Historical Provider, MD  esomeprazole (NEXIUM) 40 MG capsule Take 1 capsule (40 mg total) by mouth 2 (two) times daily before a meal. 12/17/15   [name], MD  EVZIO 0.4 MG/0.4ML SOAJ Inject 0.4 mLs as directed daily as needed (over dose).  07/10/15   Historical Provider, MD  ferrous sulfate 325 (65 FE) MG tablet Take 325 mg by mouth daily with breakfast.    Historical Provider, MD  fluticasone (FLONASE) 50 MCG/ACT nasal spray Place 2 sprays into both nostrils daily. 10/14/15   Asiyah Mayra Reel, MD  fluticasone (FLOVENT HFA) 110 MCG/ACT inhaler Inhale 2 puffs into the lungs 2 (two) times daily.    Historical Provider, MD  Insulin Glargine (LANTUS SOLOSTAR) 100 UNIT/ML Solostar Pen Inject 30 Units into the skin at  bedtime. Patient taking differently: Inject 40 Units into the skin at bedtime.  08/23/15   Dayna N Dunn, PA-C  levETIRAcetam (KEPPRA XR) 500 MG 24 hr tablet Take 4 tablets (2,000 mg total) by mouth at bedtime. 06/11/15   Suanne Marker, MD  loratadine (CLARITIN) 10 MG tablet Take 10 mg by mouth every morning.    Historical Provider, MD  Magnesium Oxide 400 (240 Mg) MG TABS take 1 tablet by mouth once daily 03/12/16   Dayna N Dunn, PA-C  mometasone-formoterol (DULERA) 200-5 MCG/ACT AERO Inhale 2 puffs into the lungs 2 (two) times daily.    Historical Provider, MD  nitroGLYCERIN (NITROSTAT) 0.4 MG SL tablet Place 1 tablet (0.4 mg total) under the tongue every 5 (five) minutes  as needed for chest pain (do nto exceed 3 doses). 09/12/15   Brittainy M Simmons, PA-C  NOVOLOG FLEXPEN 100 UNIT/ML FlexPen Inject 18-22 Units as directed 2 (two) times daily.  03/29/15   Historical Provider, MD  ondansetron (ZOFRAN-ODT) 8 MG disintegrating tablet Take 8 mg by mouth every 6 (six) hours as needed for nausea.    Historical Provider, MD  oxyCODONE-acetaminophen (PERCOCET) 10-325 MG tablet Take 1 tablet by mouth 5 (five) times daily. (scheduled)     Historical Provider, MD  potassium chloride (K-DUR,KLOR-CON) 10 MEQ tablet Take 10 mEq by mouth daily.    Historical Provider, MD  pregabalin (LYRICA) 100 MG capsule Take 100 mg by mouth 3 (three) times daily.    Historical Provider, MD  vitamin C (ASCORBIC ACID) 500 MG tablet Take 500 mg by mouth 2 (two) times daily.    Historical Provider, MD  VOLTAREN 1 % GEL Apply 2 g topically 4 (four) times daily as needed. For pain 08/13/14   Historical Provider, MD  XARELTO 20 MG TABS tablet TAKE 1 TABLET BY MOUTH ONCE DAILY WITH SUPPER 08/22/15   Wendall Stade, MD    Family History Family History  Problem Relation Age of Onset  . Heart attack Mother     @ age 37  . Mental illness Mother   . Diabetes Mother   . Hypertension Mother     siblings  . Alzheimer's disease Mother    . Depression Mother   . Hyperlipidemia Mother   . Heart attack Brother 50  . Alcohol abuse Brother   . Depression Brother   . Diabetes Brother   . Hyperlipidemia Brother   . Hypertension Brother   . Kidney disease Brother   . Drug abuse Brother   . Colon cancer Maternal Aunt   . Prostate cancer Maternal Grandfather   . Diabetes Maternal Grandfather   . Hyperlipidemia Maternal Grandfather   . Ovarian cancer Maternal Aunt   . Diabetes    . Hypertension    . Lupus Sister   . Alcohol abuse Sister   . Depression Sister   . Diabetes Sister   . Hyperlipidemia Sister   . Hypertension Sister   . Kidney disease Sister   . Drug abuse Sister   . Ovarian cancer Cousin   . Alcohol abuse Father   . Heart attack Father   . Hyperlipidemia Father   . Hypertension Father   . Diabetes Maternal Grandmother   . Hyperlipidemia Maternal Grandmother     Social History Social History  Substance Use Topics  . Smoking status: Never Smoker  . Smokeless tobacco: Never Used  . Alcohol use No     Allergies   Prednisone; Amitriptyline; and Hydromorphone hcl   Review of Systems Review of Systems  Constitutional: Positive for chills and fever.  HENT: Positive for congestion and rhinorrhea. Negative for sore throat.   Eyes: Negative for redness.  Respiratory: Positive for cough and shortness of breath.   Cardiovascular: Positive for chest pain. Negative for leg swelling.  Gastrointestinal: Positive for nausea. Negative for abdominal pain, diarrhea and vomiting.  Genitourinary: Negative for dysuria.  Musculoskeletal: Negative for myalgias.  Skin: Negative for rash.  Neurological: Negative for headaches.    Physical Exam Updated Vital Signs BP 109/87 (BP Location: Right Arm)   Pulse 78   Temp 97.4 F (36.3 C) (Oral)   Resp 20   SpO2 98%   Physical Exam  Constitutional: She appears well-developed and well-nourished.  HENT:  Head: Normocephalic and atraumatic.  Mouth/Throat:  Oropharynx is clear and moist.  Eyes: Conjunctivae are normal. Right eye exhibits no discharge. Left eye exhibits no discharge.  Neck: Normal range of motion. Neck supple.  Cardiovascular: Normal rate, regular rhythm and normal heart sounds.   No murmur heard. Pulmonary/Chest: Effort normal and breath sounds normal. No respiratory distress. She has no wheezes. She has no rales.  Abdominal: Soft. There is no tenderness.  Neurological: She is alert.  Skin: Skin is warm and dry.  Psychiatric: She has a normal mood and affect.  Nursing note and vitals reviewed.    ED Treatments / Results  Labs (all labs ordered are listed, but only abnormal results are displayed) Labs Reviewed  COMPREHENSIVE METABOLIC PANEL - Abnormal; Notable for the following:       Result Value   Creatinine, Ser 1.71 (*)    GFR calc non Af Amer 30 (*)    GFR calc Af Amer 35 (*)    All other components within normal limits  URINALYSIS, ROUTINE W REFLEX MICROSCOPIC (NOT AT [facility]) - Abnormal; Notable for the following:    Leukocytes, UA SMALL (*)    All other components within normal limits  URINE MICROSCOPIC-ADD ON - Abnormal; Notable for the following:    Squamous Epithelial / LPF 6-30 (*)    Bacteria, UA RARE (*)    Crystals CA OXALATE CRYSTALS (*)    All other components within normal limits  I-STAT CG4 LACTIC ACID, ED - Abnormal; Notable for the following:    Lactic Acid, Venous 2.19 (*)    All other components within normal limits  CBC  I-STAT CG4 LACTIC ACID, ED    EKG  EKG Interpretation None       Radiology Dg Chest 2 View  Result Date: 04/20/2016 CLINICAL DATA:  Nausea, fever, congestion, chills EXAM: CHEST  2 VIEW COMPARISON:  03/20/2016 FINDINGS: Lungs are clear.  No pleural effusion or pneumothorax. The heart is normal in size. Cervical spine fixation hardware. IMPRESSION: Normal chest radiographs. Electronically Signed   By: Charline BillsSriyesh  Krishnan M.D.   On: 04/20/2016 14:31     Procedures Procedures (including critical care time)  Medications Ordered in ED Medications  albuterol (PROVENTIL) (2.5 MG/3ML) 0.083% nebulizer solution 5 mg (5 mg Nebulization Given 04/20/16 1506)  ipratropium (ATROVENT) nebulizer solution 0.5 mg (0.5 mg Nebulization Given 04/20/16 1506)  sodium chloride 0.9 % bolus 1,000 mL (0 mLs Intravenous Stopped 04/20/16 1728)  ondansetron (ZOFRAN) injection 4 mg (4 mg Intravenous Given 04/20/16 1726)  fentaNYL (SUBLIMAZE) injection 50 mcg (50 mcg Intravenous Given 04/20/16 1729)  acetaminophen (TYLENOL) tablet 650 mg (650 mg Oral Given 04/20/16 1727)     Initial Impression / Assessment and Plan / ED Course  I have reviewed the triage vital signs and the nursing notes.  Pertinent labs & imaging results that were available during my care of the patient were reviewed by me and considered in my medical decision making (see chart for details).  Clinical Course   Patient seen and examined. Work-up initiated. Medications ordered.   Vital signs reviewed and are as follows: BP 109/87 (BP Location: Right Arm)   Pulse 78   Temp 97.4 F (36.3 C) (Oral)   Resp 20   SpO2 98%   6:07 PM Patient updated on all results.   Encouraged patient to continue medications as prescribed by her PCP. Follow-up for recheck in the next 2-3 days. Return to emergency department with worsening symptoms, shortness of breath,  difficulty breathing, high persistent fever, persistent vomiting, new symptoms or other concerns. Patient verbalizes understanding and agrees with plan. States that she is ready for discharge home.  Final Clinical Impressions(s) / ED Diagnoses   Final diagnoses:  Viral syndrome   Patient with constellation of symptoms consistent with viral syndrome or bronchitis. No pneumonia demonstrated on chest x-ray. Patient is anticoagulated on Xarelto, making PE less likely. No clinical concern for PE. No significant chest pain to suggest ACS. Patient is not  tachycardic or hypoxic. Mild elevation in creatinine. This was treated with IV fluids. Mild initial elevation in lactate, improved with treatment. Patient appears well, nontoxic at time of discharge. She is breathing comfortably without any distress. She is appropriate follow-up. Return instructions as above.  New Prescriptions New Prescriptions   ONDANSETRON (ZOFRAN ODT) 4 MG DISINTEGRATING TABLET    Take 1 tablet (4 mg total) by mouth every 8 (eight) hours as needed for nausea or vomiting.     Renne Crigler, PA-C 04/20/16 1810    Tilden Fossa, MD 04/21/16 1400

## 2016-04-20 NOTE — ED Triage Notes (Signed)
Pt sts nausea and nasal congestion; pt sts fever and chills a times

## 2016-04-20 NOTE — Discharge Instructions (Signed)
Please read and follow all provided instructions.  Your diagnoses today include:  1. Viral syndrome     Tests performed today include:  Blood counts and electrolytes - normal  Urine test - no infection  Chest x-ray - no pneumonia  Vital signs. See below for your results today.   Medications prescribed:   Zofran (ondansetron) - for nausea and vomiting  Take any prescribed medications only as directed.  Home care instructions:  Follow any educational materials contained in this packet.  Continue taking the Augmentin prescribed by your physician.  Follow-up instructions: Please follow-up with your primary care provider in the next 3 days for further evaluation of your symptoms.   Return instructions:   Please return to the Emergency Department if you experience worsening symptoms.  Return with worsening shortness of breath, increased work of breathing, high persistent fever, persistent vomiting, new symptoms  Please return if you have any other emergent concerns.  Additional Information:  Your vital signs today were: BP 128/70 (BP Location: Left Arm)    Pulse 83    Temp 97.3 F (36.3 C) (Oral)    Resp 14    SpO2 95%  If your blood pressure (BP) was elevated above 135/85 this visit, please have this repeated by your doctor within one month. --------------

## 2016-04-24 ENCOUNTER — Other Ambulatory Visit: Payer: Self-pay | Admitting: Internal Medicine

## 2016-05-10 ENCOUNTER — Encounter (HOSPITAL_COMMUNITY): Payer: Self-pay | Admitting: *Deleted

## 2016-05-10 ENCOUNTER — Emergency Department (HOSPITAL_COMMUNITY): Payer: Medicaid Other

## 2016-05-10 ENCOUNTER — Emergency Department (HOSPITAL_COMMUNITY)
Admission: EM | Admit: 2016-05-10 | Discharge: 2016-05-10 | Disposition: A | Discharge status: Home / Self Care | Payer: Medicaid Other | Attending: Emergency Medicine | Admitting: Emergency Medicine

## 2016-05-10 DIAGNOSIS — R197 Diarrhea, unspecified: Secondary | ICD-10-CM | POA: Insufficient documentation

## 2016-05-10 DIAGNOSIS — R1110 Vomiting, unspecified: Secondary | ICD-10-CM | POA: Diagnosis not present

## 2016-05-10 DIAGNOSIS — E1122 Type 2 diabetes mellitus with diabetic chronic kidney disease: Secondary | ICD-10-CM | POA: Insufficient documentation

## 2016-05-10 DIAGNOSIS — R51 Headache: Secondary | ICD-10-CM | POA: Diagnosis not present

## 2016-05-10 DIAGNOSIS — Z794 Long term (current) use of insulin: Secondary | ICD-10-CM | POA: Diagnosis not present

## 2016-05-10 DIAGNOSIS — J45909 Unspecified asthma, uncomplicated: Secondary | ICD-10-CM | POA: Diagnosis not present

## 2016-05-10 DIAGNOSIS — I129 Hypertensive chronic kidney disease with stage 1 through stage 4 chronic kidney disease, or unspecified chronic kidney disease: Secondary | ICD-10-CM | POA: Diagnosis not present

## 2016-05-10 DIAGNOSIS — R531 Weakness: Secondary | ICD-10-CM | POA: Insufficient documentation

## 2016-05-10 DIAGNOSIS — N183 Chronic kidney disease, stage 3 (moderate): Secondary | ICD-10-CM | POA: Diagnosis not present

## 2016-05-10 DIAGNOSIS — Z79899 Other long term (current) drug therapy: Secondary | ICD-10-CM | POA: Diagnosis not present

## 2016-05-10 LAB — CBC
HEMATOCRIT: 43.5 % (ref 36.0–46.0)
HEMOGLOBIN: 14.2 g/dL (ref 12.0–15.0)
MCH: 30 pg (ref 26.0–34.0)
MCHC: 32.6 g/dL (ref 30.0–36.0)
MCV: 91.8 fL (ref 78.0–100.0)
Platelets: 206 10*3/uL (ref 150–400)
RBC: 4.74 MIL/uL (ref 3.87–5.11)
RDW: 13.1 % (ref 11.5–15.5)
WBC: 3.7 10*3/uL — AB (ref 4.0–10.5)

## 2016-05-10 LAB — COMPREHENSIVE METABOLIC PANEL
ALBUMIN: 4.2 g/dL (ref 3.5–5.0)
ALT: 18 U/L (ref 14–54)
AST: 25 U/L (ref 15–41)
Alkaline Phosphatase: 98 U/L (ref 38–126)
Anion gap: 12 (ref 5–15)
BILIRUBIN TOTAL: 0.6 mg/dL (ref 0.3–1.2)
BUN: 15 mg/dL (ref 6–20)
CO2: 18 mmol/L — AB (ref 22–32)
Calcium: 9.5 mg/dL (ref 8.9–10.3)
Chloride: 108 mmol/L (ref 101–111)
Creatinine, Ser: 1.53 mg/dL — ABNORMAL HIGH (ref 0.44–1.00)
GFR calc Af Amer: 40 mL/min — ABNORMAL LOW (ref >60)
GFR calc non Af Amer: 35 mL/min — ABNORMAL LOW (ref >60)
GLUCOSE: 167 mg/dL — AB (ref 65–99)
POTASSIUM: 4 mmol/L (ref 3.5–5.1)
SODIUM: 138 mmol/L (ref 135–145)
TOTAL PROTEIN: 8.2 g/dL — AB (ref 6.5–8.1)

## 2016-05-10 LAB — I-STAT TROPONIN, ED: TROPONIN I, POC: 0 ng/mL (ref 0.00–0.08)

## 2016-05-10 LAB — RAPID URINE DRUG SCREEN, HOSP PERFORMED
Amphetamines: NOT DETECTED
BARBITURATES: NOT DETECTED
BENZODIAZEPINES: NOT DETECTED
Cocaine: NOT DETECTED
Opiates: POSITIVE — AB
Tetrahydrocannabinol: NOT DETECTED

## 2016-05-10 LAB — URINE MICROSCOPIC-ADD ON: RBC / HPF: NONE SEEN RBC/hpf (ref 0–5)

## 2016-05-10 LAB — URINALYSIS, ROUTINE W REFLEX MICROSCOPIC
Bilirubin Urine: NEGATIVE
Glucose, UA: NEGATIVE mg/dL
Hgb urine dipstick: NEGATIVE
Ketones, ur: 15 mg/dL — AB
NITRITE: NEGATIVE
PH: 5.5 (ref 5.0–8.0)
Protein, ur: NEGATIVE mg/dL

## 2016-05-10 LAB — PROTIME-INR
INR: 1.62
PROTHROMBIN TIME: 19.4 s — AB (ref 11.4–15.2)

## 2016-05-10 LAB — ETHANOL: Alcohol, Ethyl (B): <5 mg/dL (ref <5)

## 2016-05-10 LAB — CBG MONITORING, ED: Glucose-Capillary: 167 mg/dL — ABNORMAL HIGH (ref 65–99)

## 2016-05-10 LAB — APTT: aPTT: 35 seconds (ref 24–36)

## 2016-05-10 MED ORDER — ACETAMINOPHEN 325 MG PO TABS
650.0000 mg | ORAL_TABLET | Freq: Once | ORAL | Status: AC
  Administered 2016-05-10: 650 mg via ORAL
  Filled 2016-05-10: qty 2

## 2016-05-10 MED ORDER — SODIUM CHLORIDE 0.9 % IV BOLUS (SEPSIS)
500.0000 mL | Freq: Once | INTRAVENOUS | Status: AC
  Administered 2016-05-10: 500 mL via INTRAVENOUS

## 2016-05-10 NOTE — ED Notes (Signed)
Pt walked to restroom while on O2 monitoring, O2 was 94%-99%.

## 2016-05-10 NOTE — ED Triage Notes (Addendum)
Pt reports not feeling well for several days. Having weakness, n/v/d, tremors and syncopal episodes. Tremors noted at triage. ekg done. Airway intact. Reports headache, lower back pain. Denies urinary symptoms. Unable to stand due to weakness and dizziness.

## 2016-05-10 NOTE — Discharge Instructions (Signed)
Please read and follow all provided instructions.  Your diagnoses today include:  1. Vomiting and diarrhea   2. Weakness     Tests performed today include:  Blood counts and electrolytes  Urine test  EKG  Head CT  Vital signs. See below for your results today.   Medications prescribed:   None  Take any prescribed medications only as directed.  Home care instructions:  Follow any educational materials contained in this packet.  BE VERY CAREFUL not to take multiple medicines containing Tylenol (also called acetaminophen). Doing so can lead to an overdose which can damage your liver and cause liver failure and possibly death.   Follow-up instructions: Please follow-up with your primary care provider in the next 3 days for further evaluation of your symptoms.   Return instructions:   Please return to the Emergency Department if you experience worsening symptoms.   Return with chest pain, shortness of breath, persistent vomiting, fever.  Return if you have weakness in your arms or legs, slurred speech, trouble walking or talking, confusion, or trouble with your balance.   Please return if you have any other emergent concerns.  Additional Information:  Your vital signs today were: BP 137/88    Pulse 80    Temp 98 F (36.7 C)    Resp 19    Ht 5\' 6"  (1.676 m)    Wt 90.7 kg    SpO2 95%    BMI 32.28 kg/m  If your blood pressure (BP) was elevated above 135/85 this visit, please have this repeated by your doctor within one month. --------------

## 2016-05-10 NOTE — ED Provider Notes (Signed)
[facility]Y DEPT Provider Note   CSN: [id number] Arrival date & time: 05/10/16  1021     History   Chief Complaint Chief Complaint  Patient presents with  . Weakness  . Emesis    HPI [name] is a 65 y.o. female.  Patient with history of atrial fibrillation on Xarelto, chronic kidney disease, seizure disorder on antiepileptics, depression, bipolar disorder, diabetes -- presents with several days of nausea, vomiting, and diarrhea. Patient has also noted drawing up of the left side of her face, tremors of her right upper extremity, and 3 episodes of syncope today while going to and at church. These were not her typical seizure episodes. States last seizure in August. She has generalized weakness. She has a headache, denies injury. She is having difficulty walking and standing due to lightheadedness. Patient was recently seen by myself for a viral type syndrome. She had been prescribed antibiotics by her primary care physician which she took. Patient states those symptoms improved. Patient denies other signs of stroke including: slurred speech, aphasia, weakness/numbness in extremities. States that she is compliant with her medications, but did not take seizure medications morning. No recent new medications or medication changes. The onset of this condition was acute. The course is constant. Aggravating factors: none. Alleviating factors: none.         Past Medical History:  Diagnosis Date  . Anxiety   . Arthritis   . Asthma    Dr. [name]  . Chronic back pain   . CKD (chronic kidney disease), stage III   . Colon polyps   . Depression   . Diverticulosis   . Epilepsy (HCC)   . Essential hypertension   . Fibromyalgia   . Gallstones   . GERD (gastroesophageal reflux disease)   . H/O hiatal hernia   . History of cardiac catheterization    a. Normal coronaries 2012. b. Normal nuc 09/2014.  Marland Kitchen Hyperlipidemia   . IBS (irritable bowel syndrome)   . Kidney stones   .  Migraine   . Paroxysmal a-fib (HCC)    a. On [name]. b. Recurrence 08/2014 in setting of GI illness.  . Peripheral neuropathy (HCC)   . Sarcoidosis of lung (HCC)    Dr. [name]  . Seizures (HCC)   . Type II diabetes mellitus [facility])     Patient Active Problem List   Diagnosis Date Noted  . Irritable bowel syndrome 03/03/2016  . Abdominal pain 01/16/2016  . Upper respiratory infection 10/14/2015  . Rhinitis, chronic 10/10/2015  . Chronic leukopenia 09/13/2015  . PAF (paroxysmal atrial fibrillation) (HCC) 08/22/2015  . Breast nodule 07/26/2015  . Morbid obesity (HCC) 07/11/2015  . Bipolar I disorder (HCC) 05/22/2015  . Chronic kidney disease (CKD), stage III (moderate) 05/14/2015  . Chronic hip pain 05/14/2015  . Seizure disorder (HCC)   . History of diverticulitis   . Personal history of colonic polyps 09/01/2011  . Sarcoid (HCC) 09/01/2011  . Cough variant asthma 09/01/2011  . GERD (gastroesophageal reflux disease) 09/01/2011  . Obesity 09/01/2011  . 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 CHEST PAIN 09/26/2010  . Diabetes mellitus type 2, uncontrolled, with complications (HCC) 11/19/2008  . Hyperlipidemia 11/19/2008  . Essential hypertension 11/19/2008  . Cough 11/19/2008    Past Surgical History:  Procedure Laterality Date  . ABDOMINAL HYSTERECTOMY  1995  . ANTERIOR CERVICAL DECOMP/DISCECTOMY FUSION  07/11/2012   Procedure: ANTERIOR CERVICAL DECOMPRESSION/DISCECTOMY FUSION 1 LEVEL;  Surgeon: [name], MD;  Location: [facility];  Service:  Neurosurgery;  Laterality: N/A;  Cervical Five-Six Anterior Cervical Decompression with Fusion Interbody Prothesis Plating and Bonegraft  . CARDIAC CATHETERIZATION  2012  . CHOLECYSTECTOMY  1976  . CLOSED REDUCTION ANKLE FRACTURE Left 10/2006  . COLON SURGERY    . DILATION AND CURETTAGE OF UTERUS    . ESOPHAGEAL MANOMETRY  03/21/2012   Procedure: ESOPHAGEAL MANOMETRY (EM);  Surgeon: [name], MD;  Location: [facility] ENDOS[facility];  Service:  Endoscopy;  Laterality: N/A;  . FRACTURE SURGERY    . NEUROPLASTY / TRANSPOSITION MEDIAN NERVE AT CARPAL TUNNEL Left 2004  . NEUROPLASTY / TRANSPOSITION MEDIAN NERVE AT CARPAL TUNNEL Right 2002  . 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 SALPINGOOPHORECTOMY Bilateral 2000  . TUBAL LIGATION  1982    OB History    No data available       Home Medications    Prior to Admission medications   Medication Sig Start Date End Date Taking? Authorizing Provider  albuterol (PROVENTIL HFA;VENTOLIN HFA) 108 (90 BASE) MCG/ACT inhaler Inhale 2 puffs into the lungs every 6 (six) hours as needed for wheezing or shortness of breath. 05/27/15   Asiyah Mayra Reel, MD  amoxicillin-clavulanate (AUGMENTIN) 875-125 MG tablet Take 1 tablet by mouth 2 (two) times daily. 04/16/16   Asiyah Mayra Reel, MD  atorvastatin (LIPITOR) 40 MG tablet Take 40 mg by mouth daily at 6 PM.  04/07/15   Historical Provider, MD  calcium carbonate (OS-CAL) 600 MG TABS tablet Take 600 mg by mouth daily.    Historical Provider, MD  cholecalciferol (VITAMIN D) 1000 UNITS tablet Take 1,000 Units by mouth 2 (two) times daily.     Historical Provider, MD  Cranberry 500 MG CAPS Take 500 mg by mouth 2 (two) times daily.    Historical Provider, MD  diltiazem (CARDIZEM CD) 120 MG 24 hr capsule Take 1 capsule (120 mg total) by mouth daily. 12/17/15   Wendall Stade, MD  dofetilide [facility]) 250 MCG capsule take 1 capsule by mouth twice a day 11/27/15   Wendall Stade, MD  doxazosin (CARDURA) 4 MG tablet Take 4 mg by mouth at bedtime.     Historical Provider, MD  DULoxetine (CYMBALTA) 60 MG capsule Take 60 mg by mouth at bedtime.     Historical Provider, MD  esomeprazole (NEXIUM) 40 MG capsule Take 1 capsule (40 mg total) by mouth 2 (two) times daily before a meal. 12/17/15   [name], MD  EVZIO 0.4 MG/0.4ML SOAJ Inject 0.4 mLs as directed daily as needed (over dose).  07/10/15   Historical Provider, MD  ferrous sulfate 325 (65 FE) MG  tablet Take 325 mg by mouth daily with breakfast.    Historical Provider, MD  fluticasone (FLONASE) 50 MCG/ACT nasal spray Place 2 sprays into both nostrils daily. 10/14/15   Asiyah Mayra Reel, MD  fluticasone (FLOVENT HFA) 110 MCG/ACT inhaler Inhale 2 puffs into the lungs 2 (two) times daily.    Historical Provider, MD  Insulin Glargine (LANTUS SOLOSTAR) 100 UNIT/ML Solostar Pen Inject 30 Units into the skin at bedtime. Patient taking differently: Inject 40 Units into the skin at bedtime.  08/23/15   Dayna N Dunn, PA-C  levETIRAcetam (KEPPRA XR) 500 MG 24 hr tablet Take 4 tablets (2,000 mg total) by mouth at bedtime. 06/11/15   Suanne Marker, MD  loratadine (CLARITIN) 10 MG tablet take 1 tablet by mouth once daily 04/25/16   Asiyah Mayra Reel, MD  Magnesium Oxide 400 (240 Mg) MG  TABS take 1 tablet by mouth once daily 03/12/16   Dayna N Dunn, PA-C  mometasone-formoterol (DULERA) 200-5 MCG/ACT AERO Inhale 2 puffs into the lungs 2 (two) times daily.    Historical Provider, MD  nitroGLYCERIN (NITROSTAT) 0.4 MG SL tablet Place 1 tablet (0.4 mg total) under the tongue every 5 (five) minutes as needed for chest pain (do nto exceed 3 doses). 09/12/15   Brittainy M Simmons, PA-C  NOVOLOG FLEXPEN 100 UNIT/ML FlexPen Inject 18-22 Units as directed 2 (two) times daily. Per sliding scale 03/29/15   Historical Provider, MD  ondansetron (ZOFRAN ODT) 4 MG disintegrating tablet Take 1 tablet (4 mg total) by mouth every 8 (eight) hours as needed for nausea or vomiting. 04/20/16   Renne Crigler, PA-C  oxyCODONE-acetaminophen (PERCOCET) 10-325 MG tablet Take 1 tablet by mouth 5 (five) times daily. (scheduled)     Historical Provider, MD  Polyvinyl Alcohol-Povidone (REFRESH OP) Place 1 drop into both eyes as needed (for dry eyes).    Historical Provider, MD  potassium chloride (K-DUR,KLOR-CON) 10 MEQ tablet Take 10 mEq by mouth daily.    Historical Provider, MD  pregabalin (LYRICA) 100 MG capsule Take 100 mg by mouth 3  (three) times daily.    Historical Provider, MD  vitamin C (ASCORBIC ACID) 500 MG tablet Take 500 mg by mouth 2 (two) times daily.    Historical Provider, MD  VOLTAREN 1 % GEL Apply 2 g topically 4 (four) times daily as needed. For pain 08/13/14   Historical Provider, MD  XARELTO 20 MG TABS tablet TAKE 1 TABLET BY MOUTH ONCE DAILY WITH SUPPER 04/21/16   Wendall Stade, MD    Family History Family History  Problem Relation Age of Onset  . Heart attack Mother     @ age 27  . Mental illness Mother   . Diabetes Mother   . Hypertension Mother     siblings  . Alzheimer's disease Mother   . Depression Mother   . Hyperlipidemia Mother   . Heart attack Brother 50  . Alcohol abuse Brother   . Depression Brother   . Diabetes Brother   . Hyperlipidemia Brother   . Hypertension Brother   . Kidney disease Brother   . Drug abuse Brother   . Alcohol abuse Father   . Heart attack Father   . Hyperlipidemia Father   . Hypertension Father   . Colon cancer Maternal Aunt   . Prostate cancer Maternal Grandfather   . Diabetes Maternal Grandfather   . Hyperlipidemia Maternal Grandfather   . Ovarian cancer Maternal Aunt   . Diabetes    . Hypertension    . Lupus Sister   . Alcohol abuse Sister   . Depression Sister   . Diabetes Sister   . Hyperlipidemia Sister   . Hypertension Sister   . Kidney disease Sister   . Drug abuse Sister   . Ovarian cancer Cousin   . Diabetes Maternal Grandmother   . Hyperlipidemia Maternal Grandmother     Social History Social History  Substance Use Topics  . Smoking status: Never Smoker  . Smokeless tobacco: Never Used  . Alcohol use No     Allergies   Prednisone; Amitriptyline; and Hydromorphone hcl   Review of Systems Review of Systems  Constitutional: Positive for fatigue. Negative for fever.  HENT: Negative for congestion, dental problem, rhinorrhea, sinus pressure and sore throat.   Eyes: Negative for photophobia, discharge, redness and visual  disturbance.  Respiratory: Negative for  cough and shortness of breath.   Cardiovascular: Negative for chest pain.  Gastrointestinal: Negative for abdominal pain, diarrhea, nausea and vomiting.  Genitourinary: Negative for dysuria.  Musculoskeletal: Negative for gait problem, myalgias, neck pain and neck stiffness.  Skin: Negative for rash.  Neurological: Positive for tremors, syncope and facial asymmetry. Negative for seizures, speech difficulty, weakness, light-headedness, numbness and headaches.  Psychiatric/Behavioral: Negative for confusion.     Physical Exam Updated Vital Signs BP 112/66 (BP Location: Left Arm)   Pulse 104   Temp 97.9 F (36.6 C) (Oral)   Resp 17   Ht 5\' 6"  (1.676 m)   Wt 90.7 kg   SpO2 98%   BMI 32.28 kg/m   Physical Exam  Constitutional: She is oriented to person, place, and time. She appears well-developed and well-nourished.  HENT:  Head: Normocephalic and atraumatic.  Right Ear: Tympanic membrane, external ear and ear canal normal.  Left Ear: Tympanic membrane, external ear and ear canal normal.  Nose: Nose normal.  Mouth/Throat: Uvula is midline, oropharynx is clear and moist and mucous membranes are normal.  Eyes: Conjunctivae, EOM and lids are normal. Pupils are equal, round, and reactive to light. Right eye exhibits no nystagmus. Left eye exhibits no nystagmus.  Neck: Normal range of motion. Neck supple.  Cardiovascular: Normal rate and regular rhythm.   No murmur heard. Pulmonary/Chest: Effort normal and breath sounds normal.  Abdominal: Soft. There is no tenderness.  Musculoskeletal: She exhibits no edema.       Cervical back: She exhibits normal range of motion, no tenderness and no bony tenderness.  Neurological: She is alert and oriented to person, place, and time. She has normal strength and normal reflexes. No cranial nerve deficit or sensory deficit. She displays a negative Romberg sign. Coordination and gait normal. GCS eye subscore is  4. GCS verbal subscore is 5. GCS motor subscore is 6.  Patient actively draws her left cheek. No facial paralysis. Neg Romberg. Pt has upper extremity tremors R>>L.   Skin: Skin is warm and dry.  Psychiatric: Her speech is normal.  Nursing note and vitals reviewed.    ED Treatments / Results  Labs (all labs ordered are listed, but only abnormal results are displayed) Labs Reviewed  CBC - Abnormal; Notable for the following:       Result Value   WBC 3.7 (*)    All other components within normal limits  URINALYSIS, ROUTINE W REFLEX MICROSCOPIC (NOT AT [facility]) - Abnormal; Notable for the following:    APPearance CLOUDY (*)    Specific Gravity, Urine >1.030 (*)    Ketones, ur 15 (*)    Leukocytes, UA SMALL (*)    All other components within normal limits  PROTIME-INR - Abnormal; Notable for the following:    Prothrombin Time 19.4 (*)    All other components within normal limits  URINE RAPID DRUG SCREEN, HOSP PERFORMED - Abnormal; Notable for the following:    Opiates POSITIVE (*)    All other components within normal limits  COMPREHENSIVE METABOLIC PANEL - Abnormal; Notable for the following:    CO2 18 (*)    Glucose, Bld 167 (*)    Creatinine, Ser 1.53 (*)    Total Protein 8.2 (*)    GFR calc non Af Amer 35 (*)    GFR calc Af Amer 40 (*)    All other components within normal limits  URINE MICROSCOPIC-ADD ON - Abnormal; Notable for the following:    Squamous Epithelial /  LPF 0-5 (*)    Bacteria, UA FEW (*)    All other components within normal limits  CBG MONITORING, ED - Abnormal; Notable for the following:    Glucose-Capillary 167 (*)    All other components within normal limits  ETHANOL  APTT  URINALYSIS, ROUTINE W REFLEX MICROSCOPIC (NOT AT [facility])  I-STAT TROPOININ, ED    EKG  EKG Interpretation  Date/Time:  Sunday May 10 2016 10:51:48 EDT Ventricular Rate:  107 PR Interval:    QRS Duration: 74 QT Interval:  358 QTC Calculation: 477 R Axis:   -27 Text  Interpretation:  Atrial fibrillation with rapid ventricular response Low voltage QRS Abnormal ECG Since last tracing rate faster atrial fibrillarion  New since previous tracing Confirmed by KNOTT MD, DANIEL [phone]) on 05/10/2016 11:31:35 AM       Radiology Ct Head Wo Contrast  Result Date: 05/10/2016 CLINICAL DATA:  Headache following a seizure. Left facial droop. Several recent seizures. EXAM: CT HEAD WITHOUT CONTRAST TECHNIQUE: Contiguous axial images were obtained from the base of the skull through the vertex without intravenous contrast. COMPARISON:  03/20/2016 and brain MR dated 08/03/2014. FINDINGS: Brain: No evidence of acute infarction, hemorrhage, hydrocephalus, extra-axial collection or mass lesion/mass effect. Vascular: No hyperdense vessel or unexpected calcification. Skull: Normal. Negative for fracture or focal lesion. Sinuses/Orbits: No acute finding. Other: None. IMPRESSION: Normal examination. Electronically Signed   By: Beckie SaltsSteven  Reid M.D.   On: 05/10/2016 13:47    Procedures Procedures (including critical care time)  Medications Ordered in ED Medications  sodium chloride 0.9 % bolus 500 mL (0 mLs Intravenous Stopped 05/10/16 1448)  acetaminophen (TYLENOL) tablet 650 mg (650 mg Oral Given 05/10/16 1311)     Initial Impression / Assessment and Plan / ED Course  I have reviewed the triage vital signs and the nursing notes.  Pertinent labs & imaging results that were available during my care of the patient were reviewed by me and considered in my medical decision making (see chart for details).  Clinical Course   Patient seen and examined. Work-up initiated.   Vital signs reviewed and are as follows: BP 112/66 (BP Location: Left Arm)   Pulse 104   Temp 98 F (36.7 C)   Resp 17   Ht 5\' 6"  (1.676 m)   Wt 90.7 kg   SpO2 98%   BMI 32.28 kg/m   3:14 PM Patient discussed with and seen by Dr. Clydene PughKnott. Pt ambulated well.   No emergent conditions identified. She does not  have any findings concerning for a stroke. She has follow-up with her doctor already planned in 2 days. Reviewed results with patient and family at bedside.   Patient counseled to return if they have new unilateral weakness in their arms or legs, slurred speech, trouble walking or talking, confusion, trouble with their balance, or if they have any other concerns. Patient verbalizes understanding and agrees with plan.    Final Clinical Impressions(s) / ED Diagnoses   Final diagnoses:  Vomiting and diarrhea  Weakness   Patient with constellation of multiple symptoms. Labs are reassuring. Mild dehydration, baseline creatinine. Head CT without acute findings, no bleed. UA without infection. Neuro exam is inconsistent. Facial assymmetry is a spasm, not paralysis. She is ambulatory. No other focal symptoms which would necessitate MRI. No dangerous or life-threatening conditions suspected or identified by history, physical exam, and by work-up. No indications for hospitalization identified. Pt has f/u with her PCP in 2 days.  New Prescriptions Discharge Medication List as of 05/10/2016  3:20 PM       Renne Crigler, PA-C 05/10/16 1624    Lyndal Pulley, MD 05/10/16 1901

## 2016-05-11 ENCOUNTER — Ambulatory Visit: Payer: Medicaid Other | Admitting: Cardiovascular Disease

## 2016-05-12 ENCOUNTER — Ambulatory Visit (INDEPENDENT_AMBULATORY_CARE_PROVIDER_SITE_OTHER): Payer: Medicaid Other | Admitting: Internal Medicine

## 2016-05-12 ENCOUNTER — Other Ambulatory Visit: Payer: Self-pay

## 2016-05-12 ENCOUNTER — Encounter (HOSPITAL_COMMUNITY): Payer: Self-pay | Admitting: General Practice

## 2016-05-12 ENCOUNTER — Ambulatory Visit (HOSPITAL_COMMUNITY)
Admission: RE | Admit: 2016-05-12 | Discharge: 2016-05-12 | Disposition: A | Discharge status: Home / Self Care | Payer: Medicaid Other | Source: Ambulatory Visit | Attending: Family Medicine | Admitting: Family Medicine

## 2016-05-12 ENCOUNTER — Ambulatory Visit: Payer: Medicaid Other

## 2016-05-12 ENCOUNTER — Observation Stay (HOSPITAL_COMMUNITY)
Admission: AD | Admit: 2016-05-12 | Discharge: 2016-05-14 | Disposition: A | Discharge status: Home Health | Payer: Medicaid Other | Source: Ambulatory Visit | Attending: Family Medicine | Admitting: Family Medicine

## 2016-05-12 VITALS — BP 110/75 | HR 59 | Temp 98.2°F | Wt 207.4 lb

## 2016-05-12 DIAGNOSIS — I480 Paroxysmal atrial fibrillation: Secondary | ICD-10-CM | POA: Diagnosis not present

## 2016-05-12 DIAGNOSIS — E785 Hyperlipidemia, unspecified: Secondary | ICD-10-CM | POA: Insufficient documentation

## 2016-05-12 DIAGNOSIS — R531 Weakness: Secondary | ICD-10-CM

## 2016-05-12 DIAGNOSIS — Z794 Long term (current) use of insulin: Secondary | ICD-10-CM | POA: Diagnosis not present

## 2016-05-12 DIAGNOSIS — I4819 Other persistent atrial fibrillation: Secondary | ICD-10-CM

## 2016-05-12 DIAGNOSIS — E1142 Type 2 diabetes mellitus with diabetic polyneuropathy: Secondary | ICD-10-CM | POA: Diagnosis not present

## 2016-05-12 DIAGNOSIS — R569 Unspecified convulsions: Secondary | ICD-10-CM

## 2016-05-12 DIAGNOSIS — E1122 Type 2 diabetes mellitus with diabetic chronic kidney disease: Secondary | ICD-10-CM | POA: Diagnosis not present

## 2016-05-12 DIAGNOSIS — F319 Bipolar disorder, unspecified: Secondary | ICD-10-CM | POA: Diagnosis not present

## 2016-05-12 DIAGNOSIS — M797 Fibromyalgia: Secondary | ICD-10-CM | POA: Diagnosis not present

## 2016-05-12 DIAGNOSIS — Z7901 Long term (current) use of anticoagulants: Secondary | ICD-10-CM | POA: Insufficient documentation

## 2016-05-12 DIAGNOSIS — R55 Syncope and collapse: Secondary | ICD-10-CM | POA: Diagnosis present

## 2016-05-12 DIAGNOSIS — R200 Anesthesia of skin: Secondary | ICD-10-CM | POA: Diagnosis not present

## 2016-05-12 DIAGNOSIS — I481 Persistent atrial fibrillation: Secondary | ICD-10-CM | POA: Insufficient documentation

## 2016-05-12 DIAGNOSIS — R208 Other disturbances of skin sensation: Secondary | ICD-10-CM

## 2016-05-12 DIAGNOSIS — K219 Gastro-esophageal reflux disease without esophagitis: Secondary | ICD-10-CM | POA: Diagnosis not present

## 2016-05-12 DIAGNOSIS — F419 Anxiety disorder, unspecified: Secondary | ICD-10-CM | POA: Insufficient documentation

## 2016-05-12 DIAGNOSIS — N183 Chronic kidney disease, stage 3 (moderate): Secondary | ICD-10-CM | POA: Insufficient documentation

## 2016-05-12 DIAGNOSIS — J45909 Unspecified asthma, uncomplicated: Secondary | ICD-10-CM | POA: Diagnosis not present

## 2016-05-12 DIAGNOSIS — Z79899 Other long term (current) drug therapy: Secondary | ICD-10-CM | POA: Insufficient documentation

## 2016-05-12 DIAGNOSIS — G40909 Epilepsy, unspecified, not intractable, without status epilepticus: Secondary | ICD-10-CM | POA: Insufficient documentation

## 2016-05-12 DIAGNOSIS — G8929 Other chronic pain: Secondary | ICD-10-CM | POA: Diagnosis not present

## 2016-05-12 DIAGNOSIS — I129 Hypertensive chronic kidney disease with stage 1 through stage 4 chronic kidney disease, or unspecified chronic kidney disease: Secondary | ICD-10-CM | POA: Insufficient documentation

## 2016-05-12 DIAGNOSIS — R339 Retention of urine, unspecified: Secondary | ICD-10-CM | POA: Diagnosis not present

## 2016-05-12 HISTORY — DX: Bipolar disorder, unspecified: F31.9

## 2016-05-12 LAB — RAPID URINE DRUG SCREEN, HOSP PERFORMED
Amphetamines: NOT DETECTED
Barbiturates: NOT DETECTED
Benzodiazepines: NOT DETECTED
Cocaine: NOT DETECTED
OPIATES: NOT DETECTED
TETRAHYDROCANNABINOL: NOT DETECTED

## 2016-05-12 LAB — CBC
HEMATOCRIT: 38.8 % (ref 36.0–46.0)
Hemoglobin: 12.4 g/dL (ref 12.0–15.0)
MCH: 29.2 pg (ref 26.0–34.0)
MCHC: 32 g/dL (ref 30.0–36.0)
MCV: 91.5 fL (ref 78.0–100.0)
PLATELETS: 175 10*3/uL (ref 150–400)
RBC: 4.24 MIL/uL (ref 3.87–5.11)
RDW: 13.1 % (ref 11.5–15.5)
WBC: 4.3 10*3/uL (ref 4.0–10.5)

## 2016-05-12 LAB — URINALYSIS, ROUTINE W REFLEX MICROSCOPIC
BILIRUBIN URINE: NEGATIVE
Glucose, UA: NEGATIVE mg/dL
Hgb urine dipstick: NEGATIVE
KETONES UR: NEGATIVE mg/dL
NITRITE: NEGATIVE
PROTEIN: NEGATIVE mg/dL
Specific Gravity, Urine: 1.009 (ref 1.005–1.030)
pH: 5 (ref 5.0–8.0)

## 2016-05-12 LAB — COMPREHENSIVE METABOLIC PANEL
ALBUMIN: 4 g/dL (ref 3.5–5.0)
ALK PHOS: 90 U/L (ref 38–126)
ALT: 18 U/L (ref 14–54)
AST: 25 U/L (ref 15–41)
Anion gap: 11 (ref 5–15)
BILIRUBIN TOTAL: 0.6 mg/dL (ref 0.3–1.2)
BUN: 13 mg/dL (ref 6–20)
CALCIUM: 9.7 mg/dL (ref 8.9–10.3)
CO2: 25 mmol/L (ref 22–32)
Chloride: 103 mmol/L (ref 101–111)
Creatinine, Ser: 1.08 mg/dL — ABNORMAL HIGH (ref 0.44–1.00)
GFR calc Af Amer: >60 mL/min (ref >60)
GFR, EST NON AFRICAN AMERICAN: 53 mL/min — AB (ref >60)
GLUCOSE: 100 mg/dL — AB (ref 65–99)
POTASSIUM: 4.4 mmol/L (ref 3.5–5.1)
Sodium: 139 mmol/L (ref 135–145)
TOTAL PROTEIN: 7.4 g/dL (ref 6.5–8.1)

## 2016-05-12 LAB — URINE MICROSCOPIC-ADD ON: RBC / HPF: NONE SEEN RBC/hpf (ref 0–5)

## 2016-05-12 LAB — GLUCOSE, CAPILLARY
GLUCOSE-CAPILLARY: 159 mg/dL — AB (ref 65–99)
Glucose-Capillary: 101 mg/dL — ABNORMAL HIGH (ref 65–99)

## 2016-05-12 LAB — MAGNESIUM: MAGNESIUM: 2.2 mg/dL (ref 1.7–2.4)

## 2016-05-12 MED ORDER — CALCIUM CARBONATE 1500 (600 CA) MG PO TABS
600.0000 mg | ORAL_TABLET | Freq: Every day | ORAL | Status: DC
  Filled 2016-05-12: qty 1

## 2016-05-12 MED ORDER — LEVETIRACETAM ER 500 MG PO TB24
2000.0000 mg | ORAL_TABLET | Freq: Every day | ORAL | Status: DC
  Administered 2016-05-12 – 2016-05-13 (×2): 2000 mg via ORAL
  Filled 2016-05-12 (×3): qty 4

## 2016-05-12 MED ORDER — RIVAROXABAN 20 MG PO TABS
20.0000 mg | ORAL_TABLET | Freq: Every day | ORAL | Status: DC
  Administered 2016-05-12 – 2016-05-14 (×3): 20 mg via ORAL
  Filled 2016-05-12 (×3): qty 1

## 2016-05-12 MED ORDER — DULOXETINE HCL 60 MG PO CPEP
60.0000 mg | ORAL_CAPSULE | Freq: Every day | ORAL | Status: DC
  Administered 2016-05-12 – 2016-05-13 (×2): 60 mg via ORAL
  Filled 2016-05-12 (×2): qty 1

## 2016-05-12 MED ORDER — LEVETIRACETAM ER 500 MG PO TB24
500.0000 mg | ORAL_TABLET | Freq: Every day | ORAL | Status: DC
  Administered 2016-05-13 – 2016-05-14 (×2): 500 mg via ORAL
  Filled 2016-05-12 (×2): qty 1

## 2016-05-12 MED ORDER — SODIUM CHLORIDE 0.9% FLUSH
3.0000 mL | Freq: Two times a day (BID) | INTRAVENOUS | Status: DC
  Administered 2016-05-13 (×2): 3 mL via INTRAVENOUS

## 2016-05-12 MED ORDER — DOFETILIDE 250 MCG PO CAPS
250.0000 ug | ORAL_CAPSULE | Freq: Two times a day (BID) | ORAL | Status: DC
Start: 2016-05-12 — End: 2016-05-14
  Administered 2016-05-12 – 2016-05-14 (×4): 250 ug via ORAL
  Filled 2016-05-12 (×5): qty 1

## 2016-05-12 MED ORDER — PREGABALIN 100 MG PO CAPS
100.0000 mg | ORAL_CAPSULE | Freq: Three times a day (TID) | ORAL | Status: DC
  Administered 2016-05-12 – 2016-05-14 (×6): 100 mg via ORAL
  Filled 2016-05-12 (×6): qty 1

## 2016-05-12 MED ORDER — DICLOFENAC SODIUM 1 % TD GEL
2.0000 g | Freq: Four times a day (QID) | TRANSDERMAL | Status: DC | PRN
  Administered 2016-05-13 (×2): 2 g via TOPICAL
  Filled 2016-05-12: qty 100

## 2016-05-12 MED ORDER — ATORVASTATIN CALCIUM 40 MG PO TABS
40.0000 mg | ORAL_TABLET | Freq: Every day | ORAL | Status: DC
  Administered 2016-05-12 – 2016-05-14 (×3): 40 mg via ORAL
  Filled 2016-05-12 (×3): qty 1

## 2016-05-12 MED ORDER — OXYCODONE HCL 5 MG PO TABS
5.0000 mg | ORAL_TABLET | Freq: Four times a day (QID) | ORAL | Status: DC | PRN
  Administered 2016-05-12 – 2016-05-14 (×5): 5 mg via ORAL
  Filled 2016-05-12 (×5): qty 1

## 2016-05-12 MED ORDER — ACETAMINOPHEN 325 MG PO TABS: 650.0000 mg | ORAL_TABLET | Freq: Four times a day (QID) | ORAL | Status: DC | PRN

## 2016-05-12 MED ORDER — LORATADINE 10 MG PO TABS
10.0000 mg | ORAL_TABLET | Freq: Every day | ORAL | Status: DC
  Administered 2016-05-13 – 2016-05-14 (×2): 10 mg via ORAL
  Filled 2016-05-12 (×2): qty 1

## 2016-05-12 MED ORDER — MOMETASONE FURO-FORMOTEROL FUM 200-5 MCG/ACT IN AERO
2.0000 | INHALATION_SPRAY | Freq: Two times a day (BID) | RESPIRATORY_TRACT | Status: DC
  Administered 2016-05-12 – 2016-05-14 (×4): 2 via RESPIRATORY_TRACT
  Filled 2016-05-12: qty 8.8

## 2016-05-12 MED ORDER — DILTIAZEM HCL ER COATED BEADS 120 MG PO CP24
120.0000 mg | ORAL_CAPSULE | Freq: Every day | ORAL | Status: DC
  Administered 2016-05-12: 120 mg via ORAL
  Filled 2016-05-12: qty 1

## 2016-05-12 MED ORDER — INSULIN GLARGINE 100 UNIT/ML ~~LOC~~ SOLN
20.0000 [IU] | Freq: Every day | SUBCUTANEOUS | Status: DC
  Administered 2016-05-12 – 2016-05-13 (×2): 20 [IU] via SUBCUTANEOUS
  Filled 2016-05-12 (×3): qty 0.2

## 2016-05-12 MED ORDER — PANTOPRAZOLE SODIUM 40 MG PO TBEC
80.0000 mg | DELAYED_RELEASE_TABLET | Freq: Every day | ORAL | Status: DC
  Administered 2016-05-13 – 2016-05-14 (×2): 80 mg via ORAL
  Filled 2016-05-12 (×2): qty 2

## 2016-05-12 MED ORDER — INSULIN ASPART 100 UNIT/ML ~~LOC~~ SOLN
0.0000 [IU] | Freq: Three times a day (TID) | SUBCUTANEOUS | Status: DC
  Administered 2016-05-13 (×3): 3 [IU] via SUBCUTANEOUS
  Administered 2016-05-14: 2 [IU] via SUBCUTANEOUS
  Administered 2016-05-14 (×2): 3 [IU] via SUBCUTANEOUS

## 2016-05-12 MED ORDER — DILTIAZEM HCL ER COATED BEADS 120 MG PO CP24: 120.0000 mg | ORAL_CAPSULE | Freq: Every day | ORAL | Status: DC

## 2016-05-12 MED ORDER — INFLUENZA VAC SPLIT QUAD 0.5 ML IM SUSY
0.5000 mL | PREFILLED_SYRINGE | INTRAMUSCULAR | Status: AC
  Administered 2016-05-13: 0.5 mL via INTRAMUSCULAR
  Filled 2016-05-12: qty 0.5

## 2016-05-12 NOTE — H&P (Signed)
Family Medicine Teaching [facility] Admission History and Physical Service Pager: [phone]  Patient name: [name] Medical record number: [id number] Date of birth: September 26, 1950 Age: 65 y.o. Gender: female  Primary Care Provider: Danella Maiers, MD Consultants: none Code Status: full  Chief Complaint: weakness  Assessment and Plan: [name] is a 65 y.o. female presenting with dizziness, syncope, possible seizure like activity, and right sided weakness. PMH is significant for seizure disorder, HTN, T2DM, HLD, PAF, bipolar disorder, chronic back pain  Syncope with R sided weakness- recently came to ED on 9/24 after syncopal event at church that may have included seizure like activity. Head CT negative for acute process. Patient reports feeling weak and dizzy, especially when she stands up. Has history of orthostatic hypotension and +orthostatic vital signs in [facility] this AM. Also presenting with new right sided weakness that is concerning for possible stroke.  -admit to telemetry -vitals per unit -obtain orthostatic vitals -MRI head -UDS -Urinalysis and urine culture -will hold off on echo for now as part of syncopal work up but could be considered   Seizures- has been well controlled on home regimen per patient but may have had some seizure like activity recently, grandson caught her and told her she was shaking, reported incontinence during this event. Denies missing doses of keppra. Patient has slow speech in clinic and some slowed movements which could be concerning for post-ictal state.  -Continue home keppra 2.5 grams at bedtime -obtain keppra level -discussed with neurology who recommended that given patient has been well controlled on Keppra at essentially max dose, they would not recommend adjustment of medications and instead would look for other causes of potential seizure activity  -neurology did not feel that EEG would be particularly useful in patient with  known seizure disorder   HTN- blood pressure 110/75 in clinic today. Per previous cardiology notes, could tolerate some elevation in systolic BP due to diabetic autonomic dysfunction and some orthostasis. -Monitor pressures  Type 2 DM- last A1C in July 2017 at 8.6 -Continue Lantus 20 units at night -Moderate SSI -CBGS AC/HS -continue lyrica  Hyperlipidemia -Continue Lipitor 40mg  daily  Paroxysmal Atrial Fibrillation: Rate controlled at admission. EKG with QTc of 480.  -Continue xarelto  -continue Diltiazem for rate control  -continue [name], will monitor QTc and d/c for >500  -EKG in AM  -monitor K daily, keep above 4.0 due to [name] use  -monitor Mag daily, keep above 2.0   Hx of bipolar disorder -Continue cymbalta  Asthma -Continue home albuterol and Dulera  ?Bladder retention -discontinue home doxazosin as this could contribute to orthostatic hypotension   FEN/GI: regular diet Prophylaxis: on home xarelto  Disposition: admit to telemetry   History of Present Illness:  [name] is a 65 y.o. female presenting with weakness and dizziness. When stands up feels like she is going to pass out. Feels tired/weak even with sitting. LOC on Sunday at [place]. Went to ED and was discharged with reassuring labs and negative head CT, she was told to rest. She went home, lives with her grandson. Grandson told her she was leaning to one side. Patient reports that grandson witnessed shaking with her syncopal episode the evening of 9/25. Lost control of bowels with this syncopal episode. Her grandson caught her. He told her she was trying to talk but nothing was coming out. She took her medications after this event but did not want to go to the hospital again because she knew she  had an appointment at the family medicine center the next morning. She also endorses heart palpitations and has a history of atrial fibrillation, she felt like she was having palpitations last night and took her  tikosyn and felt slightly better. No current chest pain. Has been in the hospital one time before for syncope and was told it was due to low blood pressures. Feels like her right side is weaker than usual since this weekend. Reports headache. Has chronic back pain. Has a history of seizures. Has been taking Keppra. No recent illnesses, no fevers or chills.   Review Of Systems: Per HPI  ROS  Patient Active Problem List   Diagnosis Date Noted  . Irritable bowel syndrome 03/03/2016  . Abdominal pain 01/16/2016  . Upper respiratory infection 10/14/2015  . Rhinitis, chronic 10/10/2015  . Chronic leukopenia 09/13/2015  . PAF (paroxysmal atrial fibrillation) (HCC) 08/22/2015  . Breast nodule 07/26/2015  . Morbid obesity (HCC) 07/11/2015  . Bipolar I disorder (HCC) 05/22/2015  . Chronic kidney disease (CKD), stage III (moderate) 05/14/2015  . Chronic hip pain 05/14/2015  . Seizure disorder (HCC)   . History of diverticulitis   . Personal history of colonic polyps 09/01/2011  . Sarcoid (HCC) 09/01/2011  . Cough variant asthma 09/01/2011  . GERD (gastroesophageal reflux disease) 09/01/2011  . Obesity 09/01/2011  . 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 CHEST PAIN 09/26/2010  . Diabetes mellitus type 2, uncontrolled, with complications (HCC) 11/19/2008  . Hyperlipidemia 11/19/2008  . Essential hypertension 11/19/2008  . Cough 11/19/2008    Past Medical History: Past Medical History:  Diagnosis Date  . Anxiety   . Arthritis   . Asthma    Dr. Sherene SiresWert  . Chronic back pain   . CKD (chronic kidney disease), stage III   . Colon polyps   . Depression   . Diverticulosis   . Epilepsy (HCC)   . Essential hypertension   . Fibromyalgia   . Gallstones   . GERD (gastroesophageal reflux disease)   . H/O hiatal hernia   . History of cardiac catheterization    a. Normal coronaries 2012. b. Normal nuc 09/2014.  Marland Kitchen. Hyperlipidemia   . IBS (irritable bowel syndrome)   . Kidney stones   . Migraine   .  Paroxysmal a-fib (HCC)    a. On [name]. b. Recurrence 08/2014 in setting of GI illness.  . Peripheral neuropathy (HCC)   . Sarcoidosis of lung (HCC)    Dr. Sherene SiresWert  . Seizures (HCC)   . Type II diabetes mellitus (HCC)     Past Surgical History: Past Surgical History:  Procedure Laterality Date  . ABDOMINAL HYSTERECTOMY  1995  . ANTERIOR CERVICAL DECOMP/DISCECTOMY FUSION  07/11/2012   Procedure: ANTERIOR CERVICAL DECOMPRESSION/DISCECTOMY FUSION 1 LEVEL;  Surgeon: Cristi LoronJeffrey D Jenkins, MD;  Location: [facility];  Service: Neurosurgery;  Laterality: N/A;  Cervical Five-Six Anterior Cervical Decompression with Fusion Interbody Prothesis Plating and Bonegraft  . CARDIAC CATHETERIZATION  2012  . CHOLECYSTECTOMY  1976  . CLOSED REDUCTION ANKLE FRACTURE Left 10/2006  . COLON SURGERY    . DILATION AND CURETTAGE OF UTERUS    . ESOPHAGEAL MANOMETRY  03/21/2012   Procedure: ESOPHAGEAL MANOMETRY (EM);  Surgeon: Rachael Feeaniel P Jacobs, MD;  Location: [facility];  Service: Endoscopy;  Laterality: N/A;  . FRACTURE SURGERY    . NEUROPLASTY / TRANSPOSITION MEDIAN NERVE AT CARPAL TUNNEL Left 2004  . NEUROPLASTY / TRANSPOSITION MEDIAN NERVE AT CARPAL TUNNEL Right 2002  . RESECTION OF HAND NEUROMA  Left 01/2002  . SALPINGOOPHORECTOMY Bilateral 2000  . TUBAL LIGATION  1982    Social History: Social History  Substance Use Topics  . Smoking status: Never Smoker  . Smokeless tobacco: Never Used  . Alcohol use No   Additional social history: Denies EtOH and drug use. Lives with grandson.   Please also refer to relevant sections of EMR.  Family History: Family History  Problem Relation Age of Onset  . Heart attack Mother     @ age 33  . Mental illness Mother   . Diabetes Mother   . Hypertension Mother     siblings  . Alzheimer's disease Mother   . Depression Mother   . Hyperlipidemia Mother   . Heart attack Brother 50  . Alcohol abuse Brother   . Depression Brother   . Diabetes Brother   .  Hyperlipidemia Brother   . Hypertension Brother   . Kidney disease Brother   . Drug abuse Brother   . Alcohol abuse Father   . Heart attack Father   . Hyperlipidemia Father   . Hypertension Father   . Colon cancer Maternal Aunt   . Prostate cancer Maternal Grandfather   . Diabetes Maternal Grandfather   . Hyperlipidemia Maternal Grandfather   . Ovarian cancer Maternal Aunt   . Diabetes    . Hypertension    . Lupus Sister   . Alcohol abuse Sister   . Depression Sister   . Diabetes Sister   . Hyperlipidemia Sister   . Hypertension Sister   . Kidney disease Sister   . Drug abuse Sister   . Ovarian cancer Cousin   . Diabetes Maternal Grandmother   . Hyperlipidemia Maternal Grandmother     Allergies and Medications: Allergies  Allergen Reactions  . Prednisone Other (See Comments)    SENT INTO AFIB   . Amitriptyline Other (See Comments)    Disoriented.  . Hydromorphone Hcl Other (See Comments)    blood pressure drops.   No current facility-administered medications on file prior to encounter.    Current Outpatient Prescriptions on File Prior to Encounter  Medication Sig Dispense Refill  . albuterol (PROVENTIL HFA;VENTOLIN HFA) 108 (90 BASE) MCG/ACT inhaler Inhale 2 puffs into the lungs every 6 (six) hours as needed for wheezing or shortness of breath. 1 Inhaler 1  . atorvastatin (LIPITOR) 40 MG tablet Take 40 mg by mouth daily at 6 PM.   0  . calcium carbonate (OS-CAL) 600 MG TABS tablet Take 600 mg by mouth daily.    . cholecalciferol (VITAMIN D) 1000 UNITS tablet Take 1,000 Units by mouth 2 (two) times daily.     . Cranberry 500 MG CAPS Take 500 mg by mouth 2 (two) times daily.    Marland Kitchen diltiazem (CARDIZEM CD) 120 MG 24 hr capsule Take 1 capsule (120 mg total) by mouth daily. 30 capsule 11  . dofetilide ([name]) 250 MCG capsule take 1 capsule by mouth twice a day (Patient taking differently: Take 250 mcg by mouth twice daily) 180 capsule 3  . doxazosin (CARDURA) 4 MG tablet  Take 4 mg by mouth at bedtime.     . DULoxetine (CYMBALTA) 60 MG capsule Take 60 mg by mouth at bedtime.     Marland Kitchen esomeprazole (NEXIUM) 40 MG capsule Take 1 capsule (40 mg total) by mouth 2 (two) times daily before a meal. 60 capsule 6  . EVZIO 0.4 MG/0.4ML SOAJ Inject 0.4 mLs as directed daily as needed (over dose).  0  . ferrous sulfate 325 (65 FE) MG tablet Take 325 mg by mouth daily with breakfast.    . fluticasone (FLONASE) 50 MCG/ACT nasal spray Place 2 sprays into both nostrils daily. 16 g 6  . Insulin Glargine (LANTUS SOLOSTAR) 100 UNIT/ML Solostar Pen Inject 30 Units into the skin at bedtime. (Patient taking differently: Inject 40 Units into the skin at bedtime. )    . levETIRAcetam (KEPPRA XR) 500 MG 24 hr tablet Take 4 tablets (2,000 mg total) by mouth at bedtime. (Patient taking differently: Take 500-2,000 mg by mouth See admin instructions. Take 500 mg by mouth in the morning and take 2000 mg by mouth at bedtime.) 360 tablet 4  . loratadine (CLARITIN) 10 MG tablet take 1 tablet by mouth once daily (Patient taking differently: Take 10 mg by mouth once daily) 30 tablet 1  . Magnesium Oxide 400 (240 Mg) MG TABS take 1 tablet by mouth once daily (Patient taking differently: Take 400 mg by mouth once daily) 30 tablet 3  . mometasone-formoterol (DULERA) 200-5 MCG/ACT AERO Inhale 2 puffs into the lungs 2 (two) times daily.    . nitroGLYCERIN (NITROSTAT) 0.4 MG SL tablet Place 1 tablet (0.4 mg total) under the tongue every 5 (five) minutes as needed for chest pain (do nto exceed 3 doses). 25 tablet 2  . NOVOLOG FLEXPEN 100 UNIT/ML FlexPen Inject 18-22 Units as directed 2 (two) times daily. Per sliding scale  0  . ondansetron (ZOFRAN ODT) 4 MG disintegrating tablet Take 1 tablet (4 mg total) by mouth every 8 (eight) hours as needed for nausea or vomiting. 10 tablet 0  . oxyCODONE-acetaminophen (PERCOCET) 10-325 MG tablet Take 1 tablet by mouth 5 (five) times daily. (scheduled)     . Polyvinyl  Alcohol-Povidone (REFRESH OP) Place 1 drop into both eyes as needed (for dry eyes).    . potassium chloride (K-DUR,KLOR-CON) 10 MEQ tablet Take 10 mEq by mouth daily.    . pregabalin (LYRICA) 100 MG capsule Take 100 mg by mouth 3 (three) times daily.    . vitamin C (ASCORBIC ACID) 500 MG tablet Take 500 mg by mouth 2 (two) times daily.    . VOLTAREN 1 % GEL Apply 2 g topically 4 (four) times daily as needed. For pain  0  . XARELTO 20 MG TABS tablet TAKE 1 TABLET BY MOUTH ONCE DAILY WITH SUPPER (Patient taking differently: Take 20 mg by mouth once daily) 30 tablet 2    Objective: There were no vitals taken for this visit. Exam: General: pleasant elderly lady sitting in wheelchair in no acute distress Eyes: PERRL, EOMI ENTM: moist mucous membranes Cardiovascular: faint heart sounds, difficult to auscultate, bradycardic rate with normal rhythm, no obvious murmurs rubs or gallops. Faint radial pulses Respiratory: CTAB no increased work of breathing Gastrointestinal: obese abdomen, soft, nontender nondistended, +BS MSK: no edema or cyanosis Derm: skin warm and dry no rashes or lesions noted Neuro: alert and oriented x 3. Right sided facial droop. CN2-12 in tact. Strength 5/5 in upper and lower extremities. Finger to nose slow and shaky on the right, better on the left. Occasional periods of slowed speech. Sensation to extremities intact.  Psych: appropriate mood and affect  Labs and Imaging: CBC BMET   Recent Labs Lab 05/10/16 1106  WBC 3.7*  HGB 14.2  HCT 43.5  PLT 206    Recent Labs Lab 05/10/16 1106  NA 138  K 4.0  CL 108  CO2 18*  BUN 15  CREATININE 1.53*  GLUCOSE 167*  CALCIUM 9.5      Tillman Sers, DO 05/12/2016, 2:31 PM PGY-1, [facility] Family Medicine FPTS Intern pager: [phone], text pages welcome  Upper Level Addendum:  I have seen and evaluated this patient along with Dr. Wonda Olds and reviewed the above note, making necessary revisions in green.    Marcy Siren, D.O. 05/12/2016, 5:53 PM PGY-2, [facility]

## 2016-05-12 NOTE — Progress Notes (Addendum)
[name] Phone: [phone]   Date of Visit: 05/12/2016   HPI: KIMBERLYE DILGER is a 65 y.o. female presenting to clinic today for same day appointment. PCP: Danella Maiers, MD Concerns today include:  Patient came for nurse visit to get flu shot. She reported that she was not feeling well. She is here for same day visit.   Generalized Weakness:  - reports the following symptoms started this past weekend starting Friday but worsened Saturday - reports of bad HA, back aches, stomach aches; denies dysuria/hematuria; denies diarrhea currently but reports she has irritable bowel with diarrhea alternating with constipation - reports of dizziness when she gets up and walk a few steps; feels she is about to pass out  - reports of nausea; has been drinking water and gatorade and some apple sauce/jello/peanut butter crackers but could not eat real meal because the smell would make her nauseous  - reports of tingling and numbness in face around nose and mouth; reports of general weakness. Reports of intermittent numbness of right fingers for the past 2 months - reports that her grandson said that her speech was slurred all weekend; she had trouble making speech over the weekend. Reports this is better now - reports she has pulling feeling in neck on her left neck  - started this weekend on Saturday; didn't feel well Friday and started worsening Saturday; Sunday when to church almost passed out that some one has to catch; reports she completely blacked out; church members reported that she was shaking. Reports loss of control of bowels not urine. Reports those were similar symptoms of seizures.  - reports that her grandson mentioned she had a shaking episode last night. Not sure if she lost consciousness. She was trying to get out of bed to use the restroom during this time - no fevers at home  - reports of palpitations last night. Reports her heart rate was going up and  down. Would go up for a few minutes; the whole episode lasted about . Had chest tightness/pressure that radiated to her left flank and back with the palpitations; she has a heart monitor which reported HR 48-126. Took her tisosyn and felt better.  - denies current chest pain  - was seen in the ED after the syncopal episode on Sunday. CT head was negative. CBC wnl. BMP with low bicarb with normal AG otherwise unremarkable. UDS only positive for opiate. UA with small leukocytes.   ROS: See HPI.  PMFSH:  Paroxysmal Afib on Xarelto CKDIII Seizure DO on Antiepilieptic Depression/Bipolar DM  PHYSICAL EXAM: BP 110/75   Pulse (!) 59   Temp 98.2 F (36.8 C)   Wt 207 lb 6.4 oz (94.1 kg)   SpO2 97%   BMI 33.48 kg/m   Orthostatic Vitals: Lying 152/76 HR 59, Sitting 143/67 HR 60, Standing 123/60 HR 64, Standing 3 min 119/60 HR 61.  GEN: NAD, fatigued appearing HEENT: Atraumatic, normocephalic, neck supple, EOMI, sclera clear, no cervical lymphadenopathy CV: RRR, no murmurs, rubs, or gallops PULM: CTAB, normal effort ABD: Soft, mild tenderness in suprapubic region without guarding or rebound, nondistended, NABS, no organomegaly SKIN: No rash or cyanosis; warm and well-perfused EXTR: No lower extremity edema or calf tenderness PSYCH: Mood and affect euthymic, normal rate and volume of speech NEURO: Awake, alert but fatigued appearing,  reports of numbness possibly on the right face (and reports of numbness of nasal and perioral area) otherwise CN 2-12 were wnl, normal speech,  questionable decreased in grip strength of the right hand but could due to effort (but normal of the left). 5/5 strength of LE bilaterally. Normal sensation to light touch of upper and lower extremities. Finger to nose slower with the right hand. Gait not assessed.   ASSESSMENT/PLAN:  Patient with multiple somewhat vague complaints. She was recently seen in the ED with normal CT head and fairly unremarkable basic  labs. She appears very fatigued and somewhat sleepy. She also reports of paresthesias of her face and on exam may have slightly decreased strength of right upper extremity and some difficulty with finger to nose test with the right hand. She reported of questionable seizure like activity over the weekend. This along with her fatigued appearance and history of seizure disorder, there is some concern that she may have had multiple seizures and is maybe having post-ictal symptoms. With the abnormal neuro exam, patient should be evaluated further for possible stroke although CT head was negative in the ED on 05/10/16. Patient's vitals are currently stable. Her orthostatic vitals are positive by at least 20mmHg decrease of SBP; this is likely due to decreased PO intake due to nausea. EKG without signs of ischemia, but does have 1st degree AV block and possible PAC.  - will directly admit to FPTS for further evaluation of possible seizures and possible stroke like symptoms  - will order basic labs, keppra level, magnesium and MRI brain. Would recommend neurology consult.   Palma HolterKanishka G Crystalmarie Yasin, MD PGY 2 [facility]

## 2016-05-13 ENCOUNTER — Inpatient Hospital Stay (HOSPITAL_COMMUNITY): Payer: Medicaid Other

## 2016-05-13 DIAGNOSIS — Z7901 Long term (current) use of anticoagulants: Secondary | ICD-10-CM | POA: Diagnosis not present

## 2016-05-13 DIAGNOSIS — E785 Hyperlipidemia, unspecified: Secondary | ICD-10-CM | POA: Diagnosis not present

## 2016-05-13 DIAGNOSIS — I481 Persistent atrial fibrillation: Secondary | ICD-10-CM | POA: Diagnosis not present

## 2016-05-13 DIAGNOSIS — R569 Unspecified convulsions: Secondary | ICD-10-CM | POA: Diagnosis not present

## 2016-05-13 DIAGNOSIS — I480 Paroxysmal atrial fibrillation: Secondary | ICD-10-CM | POA: Diagnosis not present

## 2016-05-13 DIAGNOSIS — R55 Syncope and collapse: Secondary | ICD-10-CM | POA: Diagnosis not present

## 2016-05-13 DIAGNOSIS — F319 Bipolar disorder, unspecified: Secondary | ICD-10-CM | POA: Diagnosis not present

## 2016-05-13 DIAGNOSIS — R208 Other disturbances of skin sensation: Secondary | ICD-10-CM | POA: Diagnosis not present

## 2016-05-13 LAB — BASIC METABOLIC PANEL
ANION GAP: 11 (ref 5–15)
BUN: 11 mg/dL (ref 6–20)
CHLORIDE: 107 mmol/L (ref 101–111)
CO2: 22 mmol/L (ref 22–32)
Calcium: 9.3 mg/dL (ref 8.9–10.3)
Creatinine, Ser: 1.06 mg/dL — ABNORMAL HIGH (ref 0.44–1.00)
GFR calc Af Amer: >60 mL/min (ref >60)
GFR calc non Af Amer: 54 mL/min — ABNORMAL LOW (ref >60)
GLUCOSE: 127 mg/dL — AB (ref 65–99)
POTASSIUM: 4.1 mmol/L (ref 3.5–5.1)
Sodium: 140 mmol/L (ref 135–145)

## 2016-05-13 LAB — GLUCOSE, CAPILLARY
GLUCOSE-CAPILLARY: 155 mg/dL — AB (ref 65–99)
Glucose-Capillary: 188 mg/dL — ABNORMAL HIGH (ref 65–99)
Glucose-Capillary: 163 mg/dL — ABNORMAL HIGH (ref 65–99)
Glucose-Capillary: 135 mg/dL — ABNORMAL HIGH (ref 65–99)

## 2016-05-13 LAB — URINE CULTURE

## 2016-05-13 LAB — MAGNESIUM: Magnesium: 2 mg/dL (ref 1.7–2.4)

## 2016-05-13 LAB — TROPONIN I

## 2016-05-13 MED ORDER — DILTIAZEM HCL 30 MG PO TABS
30.0000 mg | ORAL_TABLET | Freq: Four times a day (QID) | ORAL | Status: DC
  Administered 2016-05-13 (×2): 30 mg via ORAL
  Filled 2016-05-13 (×2): qty 1

## 2016-05-13 MED ORDER — CALCIUM CARBONATE 1250 (500 CA) MG PO TABS
1.0000 | ORAL_TABLET | Freq: Every day | ORAL | Status: DC
  Administered 2016-05-13 – 2016-05-14 (×2): 500 mg via ORAL
  Filled 2016-05-13 (×2): qty 1

## 2016-05-13 MED ORDER — MAGNESIUM SULFATE IN D5W 1-5 GM/100ML-% IV SOLN
1.0000 g | Freq: Once | INTRAVENOUS | Status: AC
  Administered 2016-05-13: 1 g via INTRAVENOUS
  Filled 2016-05-13: qty 100

## 2016-05-13 MED ORDER — NITROGLYCERIN 0.4 MG SL SUBL
SUBLINGUAL_TABLET | SUBLINGUAL | Status: AC
  Filled 2016-05-13: qty 1

## 2016-05-13 MED ORDER — METOPROLOL TARTRATE 25 MG PO TABS
25.0000 mg | ORAL_TABLET | Freq: Two times a day (BID) | ORAL | Status: DC
  Filled 2016-05-13: qty 1

## 2016-05-13 MED ORDER — GI COCKTAIL ~~LOC~~
30.0000 mL | Freq: Two times a day (BID) | ORAL | Status: DC | PRN
  Administered 2016-05-13: 30 mL via ORAL
  Filled 2016-05-13: qty 30

## 2016-05-13 MED ORDER — NITROGLYCERIN 0.4 MG SL SUBL
SUBLINGUAL_TABLET | SUBLINGUAL | Status: AC
  Administered 2016-05-13: 0.4 mg
  Filled 2016-05-13: qty 1

## 2016-05-13 NOTE — Progress Notes (Signed)
Patient called c/o chest pain but points at epigastric area. MD paged.EKG done NTG SL per order given.MD will come to review.

## 2016-05-13 NOTE — Consult Note (Signed)
CONSULT NOTE  Date: 05/13/2016               Patient Name:  Myles RosenthalLinda F Borchard MRN: [id number]  DOB: 19-Mar-1951 Age / Sex: 65 y.o., female        PCP: Danella MaiersAsiyah Z Mikell Primary Cardiologist: Eden EmmsNishan            Referring Physician: Lum BabeEniola              Reason for Consult: Atrial fib,  Syncope, seizures.            History of Present Illness: Patient is a 65 y.o. female with a PMHx of PAF, HTN, DM type 2, known seizure disorder, who was [facility] for evaluation of a seizure.  We are called to evaluate her because of the concern for a  HR of 60 .   She has known PAF - is on tikosyn 250 BID ,  cardizem 120 CD a day , xarelto 20 mg a day   Was At church on Sunday and had a seizure. She lost consciousness. She had urinary incontinence. She was brought to the emergency room and stabilized.  She was in atrial fibrillation but has converted to normal sinus rhythm.  Mrs. Delmundo is fairly active. She walks on a regular basis. She takes her blood pressure and heart rate daily. Notes that her heart rate is frequently in the 50s and 60 range. Occasionally she has a heart rate in the 40s. She has very rare episodes of lightheadedness. These typically occur if her blood pressure is on the low side.  She's currently feeling quite well.   Medications: Outpatient medications: Prescriptions Prior to Admission  Medication Sig Dispense Refill Last Dose  . albuterol (PROVENTIL HFA;VENTOLIN HFA) 108 (90 BASE) MCG/ACT inhaler Inhale 2 puffs into the lungs every 6 (six) hours as needed for wheezing or shortness of breath. 1 Inhaler 1 05/12/2016 at Unknown time  . atorvastatin (LIPITOR) 40 MG tablet Take 40 mg by mouth daily at 6 PM.   0 05/11/2016 at Unknown time  . calcium carbonate (OS-CAL) 600 MG TABS tablet Take 600 mg by mouth daily.   05/11/2016 at Unknown time  . cholecalciferol (VITAMIN D) 1000 UNITS tablet Take 1,000 Units by mouth 2 (two) times daily.    05/11/2016 at  Unknown time  . Cranberry 500 MG CAPS Take 500 mg by mouth 2 (two) times daily.   05/12/2016 at Unknown time  . diltiazem (CARDIZEM CD) 120 MG 24 hr capsule Take 1 capsule (120 mg total) by mouth daily. 30 capsule 11 05/11/2016 at Unknown time  . dofetilide ([name]) 250 MCG capsule take 1 capsule by mouth twice a day (Patient taking differently: Take 250 mcg by mouth twice daily) 180 capsule 3 05/12/2016 at Unknown time  . doxazosin (CARDURA) 4 MG tablet Take 4 mg by mouth at bedtime.    05/11/2016 at Unknown time  . DULoxetine (CYMBALTA) 60 MG capsule Take 60 mg by mouth at bedtime.    05/12/2016 at Unknown time  . esomeprazole (NEXIUM) 40 MG capsule Take 1 capsule (40 mg total) by mouth 2 (two) times daily before a meal. 60 capsule 6 05/11/2016 at Unknown time  . ferrous sulfate 325 (65 FE) MG tablet Take 325 mg by mouth daily with breakfast.   05/12/2016 at Unknown time  . fluticasone (FLONASE) 50 MCG/ACT nasal spray Place 2 sprays into both nostrils daily. 16 g 6 05/12/2016 at Unknown  time  . Insulin Glargine (LANTUS SOLOSTAR) 100 UNIT/ML Solostar Pen Inject 30 Units into the skin at bedtime. (Patient taking differently: Inject 40 Units into the skin at bedtime. )   05/11/2016 at Unknown time  . levETIRAcetam (KEPPRA XR) 500 MG 24 hr tablet Take 4 tablets (2,000 mg total) by mouth at bedtime. (Patient taking differently: Take 500-2,000 mg by mouth See admin instructions. Take 500 mg by mouth in the morning and take 2000 mg by mouth at bedtime.) 360 tablet 4 05/12/2016 at Unknown time  . loratadine (CLARITIN) 10 MG tablet take 1 tablet by mouth once daily (Patient taking differently: Take 10 mg by mouth once daily) 30 tablet 1 05/11/2016 at Unknown time  . Magnesium Oxide 400 (240 Mg) MG TABS take 1 tablet by mouth once daily (Patient taking differently: Take 400 mg by mouth once daily) 30 tablet 3 05/12/2016 at Unknown time  . mometasone-formoterol (DULERA) 200-5 MCG/ACT AERO Inhale 2 puffs into the lungs 2  (two) times daily.   05/11/2016 at Unknown time  . NOVOLOG FLEXPEN 100 UNIT/ML FlexPen Inject 18-22 Units as directed 2 (two) times daily. Per sliding scale  0 05/11/2016 at Unknown time  . ondansetron (ZOFRAN ODT) 4 MG disintegrating tablet Take 1 tablet (4 mg total) by mouth every 8 (eight) hours as needed for nausea or vomiting. 10 tablet 0 05/11/2016 at Unknown time  . oxyCODONE-acetaminophen (PERCOCET) 10-325 MG tablet Take 1 tablet by mouth 5 (five) times daily. (scheduled)    05/11/2016 at Unknown time  . Polyvinyl Alcohol-Povidone (REFRESH OP) Place 1 drop into both eyes as needed (for dry eyes).   05/11/2016 at Unknown time  . potassium chloride (K-DUR,KLOR-CON) 10 MEQ tablet Take 10 mEq by mouth daily.   05/12/2016 at Unknown time  . pregabalin (LYRICA) 100 MG capsule Take 100 mg by mouth 3 (three) times daily.   05/12/2016 at Unknown time  . vitamin C (ASCORBIC ACID) 500 MG tablet Take 500 mg by mouth 2 (two) times daily.   05/12/2016 at Unknown time  . VOLTAREN 1 % GEL Apply 2 g topically 4 (four) times daily as needed. For pain  0 05/11/2016 at Unknown time  . XARELTO 20 MG TABS tablet TAKE 1 TABLET BY MOUTH ONCE DAILY WITH SUPPER (Patient taking differently: Take 20 mg by mouth once daily) 30 tablet 2 05/11/2016 at Unknown time  . EVZIO 0.4 MG/0.4ML SOAJ Inject 0.4 mLs as directed daily as needed (over dose).   0 prn  . nitroGLYCERIN (NITROSTAT) 0.4 MG SL tablet Place 1 tablet (0.4 mg total) under the tongue every 5 (five) minutes as needed for chest pain (do nto exceed 3 doses). (Patient not taking: Reported on 05/12/2016) 25 tablet 2 prn at Unknown time    Current medications: Current Facility-Administered Medications  Medication Dose Route Frequency Provider Last Rate Last Dose  . acetaminophen (TYLENOL) tablet 650 mg  650 mg Oral Q6H PRN Arvilla Market, DO      . atorvastatin (LIPITOR) tablet 40 mg  40 mg Oral q1800 Palma Holter, MD   40 mg at 05/13/16 1705  . calcium  carbonate (OS-CAL - dosed in mg of elemental calcium) tablet 500 mg of elemental calcium  1 tablet Oral Q breakfast Uvaldo Rising, MD   500 mg of elemental calcium at 05/13/16 0836  . diclofenac sodium (VOLTAREN) 1 % transdermal gel 2 g  2 g Topical QID PRN Arvilla Market, DO   2 g at 05/13/16  1141  . diltiazem (CARDIZEM) tablet 30 mg  30 mg Oral Q6H Wendee Beavers, DO   30 mg at 05/13/16 1705  . dofetilide ([name]) capsule 250 mcg  250 mcg Oral BID Arvilla Market, DO   250 mcg at 05/13/16 1103  . DULoxetine (CYMBALTA) DR capsule 60 mg  60 mg Oral QHS Palma Holter, MD   60 mg at 05/12/16 2158  . gi cocktail (Maalox,Lidocaine,Donnatal)  30 mL Oral BID PRN Garth Bigness, MD   30 mL at 05/13/16 1700  . insulin aspart (novoLOG) injection 0-15 Units  0-15 Units Subcutaneous TID WC Palma Holter, MD   3 Units at 05/13/16 1705  . insulin glargine (LANTUS) injection 20 Units  20 Units Subcutaneous QHS Palma Holter, MD   20 Units at 05/12/16 2201  . levETIRAcetam (KEPPRA XR) 24 hr tablet 2,000 mg  2,000 mg Oral QHS Palma Holter, MD   2,000 mg at 05/12/16 2159  . levETIRAcetam (KEPPRA XR) 24 hr tablet 500 mg  500 mg Oral Daily Palma Holter, MD   500 mg at 05/13/16 1107  . loratadine (CLARITIN) tablet 10 mg  10 mg Oral Daily Palma Holter, MD   10 mg at 05/13/16 1112  . mometasone-formoterol (DULERA) 200-5 MCG/ACT inhaler 2 puff  2 puff Inhalation BID Palma Holter, MD   2 puff at 05/13/16 0944  . oxyCODONE (Oxy IR/ROXICODONE) immediate release tablet 5 mg  5 mg Oral Q6H PRN Arvilla Market, DO   5 mg at 05/13/16 1711  . pantoprazole (PROTONIX) EC tablet 80 mg  80 mg Oral Q1200 Palma Holter, MD   80 mg at 05/13/16 1236  . pregabalin (LYRICA) capsule 100 mg  100 mg Oral TID Palma Holter, MD   100 mg at 05/13/16 1700  . rivaroxaban (XARELTO) tablet 20 mg  20 mg Oral Daily Palma Holter, MD   20 mg at 05/13/16  1103  . sodium chloride flush (NS) 0.9 % injection 3 mL  3 mL Intravenous Q12H Palma Holter, MD   3 mL at 05/13/16 1118     Allergies  Allergen Reactions  . Prednisone Other (See Comments)    SENT INTO AFIB   . Amitriptyline Other (See Comments)    Disoriented.  . Hydromorphone Hcl Other (See Comments)    blood pressure drops.     Past Medical History:  Diagnosis Date  . Anxiety   . Arthritis    "knees, hands, ankles, feet" (05/12/2016)  . Asthma    Dr. [name]  . Atrial fibrillation (HCC)   . Bipolar disorder (HCC)   . Chronic back pain    "lower and middle" (05/12/2016)  . CKD (chronic kidney disease), stage III   . Colon polyps   . Depression   . Diverticulosis   . Epilepsy (HCC)   . Essential hypertension   . Fibromyalgia   . Gallstones   . GERD (gastroesophageal reflux disease)   . H/O hiatal hernia   . Hyperlipidemia   . IBS (irritable bowel syndrome)   . Kidney stones    "passed them all" (05/12/2016)  . Migraine    05/12/2016 "daily since Sunday; they had calmed to once q 6 months or so"  . Paroxysmal a-fib (HCC)    a. On [name]. b. Recurrence 08/2014 in setting of GI illness.  . Peripheral neuropathy (HCC)   . Sarcoidosis of lung (HCC)    Dr. [name]  .  Seizures (HCC)    "epileptic; pretty regular recently" (05/12/2016)  . Type II diabetes mellitus (HCC)     Past Surgical History:  Procedure Laterality Date  . ABDOMINAL HYSTERECTOMY  1995  . ANTERIOR CERVICAL DECOMP/DISCECTOMY FUSION  07/11/2012   Procedure: ANTERIOR CERVICAL DECOMPRESSION/DISCECTOMY FUSION 1 LEVEL;  Surgeon: [name], MD;  Location: [facility];  Service: Neurosurgery;  Laterality: N/A;  Cervical Five-Six Anterior Cervical Decompression with Fusion Interbody Prothesis Plating and Bonegraft  . CARDIAC CATHETERIZATION  2012   a. Normal coronaries 2012. b. Normal nuc 09/2014.  Marland Kitchen CHOLECYSTECTOMY OPEN  1976  . CLOSED REDUCTION ANKLE FRACTURE Left 10/2006   "got steel rod in my  leg; and screws"  . COLON SURGERY    . DILATION AND CURETTAGE OF UTERUS    . ESOPHAGEAL MANOMETRY  03/21/2012   Procedure: ESOPHAGEAL MANOMETRY (EM);  Surgeon: [name], MD;  Location: [facility];  Service: Endoscopy;  Laterality: N/A;  . FRACTURE SURGERY    . NEUROPLASTY / TRANSPOSITION MEDIAN NERVE AT CARPAL TUNNEL Left 2004  . NEUROPLASTY / TRANSPOSITION MEDIAN NERVE AT CARPAL TUNNEL Right 2002  . 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 SALPINGOOPHORECTOMY Bilateral 2000  . TUBAL LIGATION  1982    Family History  Problem Relation Age of Onset  . Heart attack Mother     @ age 62  . Mental illness Mother   . Diabetes Mother   . Hypertension Mother     siblings  . Alzheimer's disease Mother   . Depression Mother   . Hyperlipidemia Mother   . Heart attack Brother 50  . Alcohol abuse Brother   . Depression Brother   . Diabetes Brother   . Hyperlipidemia Brother   . Hypertension Brother   . Kidney disease Brother   . Drug abuse Brother   . Alcohol abuse Father   . Heart attack Father   . Hyperlipidemia Father   . Hypertension Father   . Colon cancer Maternal Aunt   . Prostate cancer Maternal Grandfather   . Diabetes Maternal Grandfather   . Hyperlipidemia Maternal Grandfather   . Ovarian cancer Maternal Aunt   . Diabetes    . Hypertension    . Lupus Sister   . Alcohol abuse Sister   . Depression Sister   . Diabetes Sister   . Hyperlipidemia Sister   . Hypertension Sister   . Kidney disease Sister   . Drug abuse Sister   . Ovarian cancer Cousin   . Diabetes Maternal Grandmother   . Hyperlipidemia Maternal Grandmother     Social History:  reports that she has never smoked. She has never used smokeless tobacco. She reports that she does not drink alcohol or use drugs.   Review of Systems: Constitutional:  denies fever, chills, diaphoresis, appetite change and fatigue.  HEENT: denies photophobia, eye pain, redness, hearing loss, ear pain, congestion,  sore throat, rhinorrhea, sneezing, neck pain, neck stiffness and tinnitus.  Respiratory: denies SOB, DOE, cough, chest tightness, and wheezing.  Cardiovascular: admits to   palpitations    Gastrointestinal: denies nausea, vomiting, abdominal pain, diarrhea, constipation, blood in stool.  Genitourinary: denies dysuria, urgency, frequency, hematuria, flank pain and difficulty urinating.  Musculoskeletal: denies  myalgias, back pain, joint swelling, arthralgias and gait problem.   Skin: denies pallor, rash and wound.  Neurological: denies dizziness, seizures, syncope, weakness, light-headedness, numbness and headaches.   Hematological: denies adenopathy, easy bruising, personal or family bleeding history.  Psychiatric/ Behavioral:  denies suicidal ideation, mood changes, confusion, nervousness, sleep disturbance and agitation.    Physical Exam: BP (!) 126/58 (BP Location: Left Arm)   Pulse 60   Temp 98.3 F (36.8 C)   Resp 19   SpO2 99%   Wt Readings from Last 3 Encounters:  05/10/16 200 lb (90.7 kg)  04/16/16 206 lb 6.4 oz (93.6 kg)  03/20/16 200 lb (90.7 kg)    General: Vital signs reviewed and noted. Well-developed, well-nourished, in no acute distress; alert,   Head: Normocephalic, atraumatic, sclera anicteric,   Neck: Supple. Negative for carotid bruits. No JVD   Lungs:  Clear bilaterally, no  wheezes, rales, or rhonchi. Breathing is normal   Heart: RRR with S1 S2. No murmurs, rubs, or gallops   Abdomen/ GI :  Soft, non-tender, non-distended with normoactive bowel sounds. No hepatomegaly. No rebound/guarding. No obvious abdominal masses   MSK: Strength and the appear normal for age.   Extremities: No clubbing or cyanosis. No edema.  Distal pedal pulses are 2+ and equal   Neurologic:  CN are grossly intact,  No obvious motor or sensory defect.  Alert and oriented X 3. Moves all extremities spontaneously.  Psych: Responds to questions appropriately with a normal affect.     Lab  results: Basic Metabolic Panel:  Recent Labs Lab 05/10/16 1106 05/12/16 1924 05/13/16 0538  NA 138 139 140  K 4.0 4.4 4.1  CL 108 103 107  CO2 18* 25 22  GLUCOSE 167* 100* 127*  BUN 15 13 11   CREATININE 1.53* 1.08* 1.06*  CALCIUM 9.5 9.7 9.3  MG  --  2.2 2.0    Liver Function Tests:  Recent Labs Lab 05/10/16 1106 05/12/16 1924  AST 25 25  ALT 18 18  ALKPHOS 98 90  BILITOT 0.6 0.6  PROT 8.2* 7.4  ALBUMIN 4.2 4.0   No results for input(s): LIPASE, AMYLASE in the last 168 hours. No results for input(s): AMMONIA in the last 168 hours.  CBC:  Recent Labs Lab 05/10/16 1106 05/12/16 1924  WBC 3.7* 4.3  HGB 14.2 12.4  HCT 43.5 38.8  MCV 91.8 91.5  PLT 206 175    Cardiac Enzymes:  Recent Labs Lab 05/13/16 1637  TROPONINI <0.03    BNP: Invalid input(s): POCBNP  CBG:  Recent Labs Lab 05/12/16 1858 05/12/16 2155 05/13/16 0759 05/13/16 1215 05/13/16 1558  GLUCAP 101* 159* 155* 188* 163*    Coagulation Studies: No results for input(s): LABPROT, INR in the last 72 hours.   Other results: Personal review of EKG shows :  - NSR .  ECG from 05/10/16 shows atrial fib  Imaging: Mr Brain Wo Contrast  Result Date: 05/13/2016 CLINICAL DATA:  Dizziness, seizure versus syncope. RIGHT facial numbness, RIGHT extremity numbness and intermittent slurred speech for 3-4 days. History of seizures, hypertension, diabetes, atrial fibrillation. EXAM: MRI HEAD WITHOUT CONTRAST TECHNIQUE: Multiplanar, multiecho pulse sequences of the brain and surrounding structures were obtained without intravenous contrast. COMPARISON:  CT HEAD May 10, 2016 and MRI of the head August 03, 2014 FINDINGS: BRAIN: No reduced diffusion to suggest acute ischemia. No susceptibility artifact to suggest hemorrhage. The ventricles and sulci are normal for patient's age. No suspicious parenchymal signal, masses or mass effect. No abnormal extra-axial fluid collections. No extra-axial masses  though, contrast enhanced sequences would be more sensitive. Symmetric normal size, morphology and signal of the bilateral hippocampus. VASCULAR: Normal major intracranial vascular flow voids present at skull base. SKULL AND UPPER CERVICAL SPINE:  No abnormal sellar expansion. No suspicious calvarial bone marrow signal. Craniocervical junction maintained. SINUSES/ORBITS: The mastoid air-cells and included paranasal sinuses are well-aerated. The included ocular globes and orbital contents are non-suspicious. OTHER: None. IMPRESSION: Stable, normal noncontrast MRI head for age. Electronically Signed   By: Awilda Metro M.D.   On: 05/13/2016 04:14       Assessment & Plan:  1. Paroxysmal atrial fibrillation. The patient has been on [name] and is fairly well-controlled. She has occasional episodes of breakthrough atrial fibrillation and most of the time these are short-lived. She had one episode that lasted 6 days. She was in atrial fibrillation on September 24. She has converted to sinus rhythm and feels much better.  There is some concern that her heart rate might be too slow on occasion. She has occasional episodes of lightheadedness. She's on low-dose Cardizem. At this point I think that we could try her on low-dose metoprolol and discontinue the low-dose Cardizem. This may result in adequate heart rate control but at the same time not lower her blood pressure quite as much.  Continue Xarelto and [name] at current dose.  2. Syncope: Her symptoms are clearly related to a seizure. She has a known seizure disorder. I do not think that this needs any further evaluation from a cardiac standpoint. She may need further evaluation from neurology.        Vesta Mixer, Montez Hageman., MD, [facility] 05/13/2016, 5:32 PM Office - [phone] Pager [phone]

## 2016-05-13 NOTE — Discharge Summary (Signed)
Family Medicine Teaching [facility] Discharge Summary  Patient name: Toni RosenthalLinda F [name] Medical record number: [id number] Date of birth: [birth year] Age: 65 y.o. Gender: female Date of Admission: 05/12/2016  Date of Discharge: 05/14/16 Admitting Physician: Doreene ElandKehinde T Eniola, MD  Primary Care Provider: Danella MaiersAsiyah Z Mikell, MD Consultants: Cardiology  Indication for Hospitalization: Syncope with breakthrough seizure-like activity  Discharge Diagnoses/Problem List:  Syncope Seizures Right Sided Weakness Hypertension Diabetes Mellitus Type 2 Hyperlipidemia Paroxysmal Atrial Fibrillation Bladder Retention Bipolar Disorder Asthma  Disposition: Home with [facility] PT  Discharge Condition: Stable, imporved  Discharge Exam: General: well nourished, well developed, appears to be worried about cause for syncope with non-toxic appearance HEENT: PERRLA, EOMI, normocephalic, atraumatic, moist mucous membranes Neck: supple, non-tender without lymphadenopathy CV: regular rate and rhythm without murmurs, rubs, or gallops Lungs: clear to auscultation bilaterally with normal work of breathing Abdomen: soft, non-tender, no masses or organomegaly palpable, normoactive bowel sounds Skin: warm, dry, no rashes or lesions, cap refill < 2 seconds Extremities: warm and well perfused, normal tone, equal weakness to motor strength all 4 extremities Neuro: alert and oriented x3, improved resting tremor of right UE, decreased fine motor skills with right UE Psych: appropriate mood and affect  Brief Hospital Course:  Toni SciaraLinda F Flowersis a 65 y.o.femalewho presented with dizziness, syncope, and seizure-like activity with right-sided weakness. PMH is significant for seizure disorder, HTN, T2DM, HLD, PAF, bipolar disorder, chronic back pain. Patient takes Keppra 2.5 grams daily.  Patient initially experienced a syncopal episode with LOC while at church in her seat on 05/10/16 but did sustain trauma. She then presented to  the ED and was discharged after a neg head CT. Grandson noticed she was shacking the next evening with loss of bowel control. [facility] with c/o palpitations and pos orthostatics and was sent to the hospital for evaluation of syncope.  Patient was admitted on 05/12/16 for syncope work up and breakthrough seizure. Noted right sided weakness which improved. MRI was neg for acute processes and electrolytes remained stable. Neurology did not feel patient needed repeat EEG. Patient was found to have a heart rate of 60 so cardiology was consulted and recommended continuing [name] and switching Cardizem to Lopressor low dose.   Patient was stable and discharged on 05/14/16 with PCP, cardiology and neurology f/u.  Issues for Follow Up:  1. Cardiology recommended continuing [name] and is to discontinue Cardizem and starting Lopressor 12.5 mg BID for a-fib rate control, take this depite HR in 60s per cards rec 2. Discontinue doxazosin for bladder retention due to syncopal activity 3. Patient is not to drive for 6 months 4. Check magnesium (keep >2.0) and potassium levels (keep >4.0) while on [name] at PCP office 5. Home health PT with cane 6. Home health OT with tub/shower seat 7. Cardiology f/u on 11/29 8. Neurology f/u on 10/27  Significant Procedures: None  Significant Labs and Imaging:   Recent Labs Lab 05/10/16 1106 05/12/16 1924  WBC 3.7* 4.3  HGB 14.2 12.4  HCT 43.5 38.8  PLT 206 175    Recent Labs Lab 05/10/16 1106 05/12/16 1924 05/13/16 0538  NA 138 139 140  K 4.0 4.4 4.1  CL 108 103 107  CO2 18* 25 22  GLUCOSE 167* 100* 127*  BUN 15 13 11   CREATININE 1.53* 1.08* 1.06*  CALCIUM 9.5 9.7 9.3  MG  --  2.2 2.0  ALKPHOS 98 90  --   AST 25 25  --  ALT 18 18  --   ALBUMIN 4.2 4.0  --    MR Brain Wo Contrast (05/12/2016) FINDINGS: BRAIN: No reduced diffusion to suggest acute ischemia. No susceptibility artifact to suggest hemorrhage.  The ventricles and sulci are normal for patient's age. No suspicious parenchymal signal, masses or mass effect. No abnormal extra-axial fluid collections. No extra-axial masses though, contrast enhanced sequences would be more sensitive. Symmetric normal size, morphology and signal of the bilateral hippocampus.  VASCULAR: Normal major intracranial vascular flow voids present at skull base.  SKULL AND UPPER CERVICAL SPINE: No abnormal sellar expansion. No suspicious calvarial bone marrow signal. Craniocervical junction maintained.  SINUSES/ORBITS: The mastoid air-cells and included paranasal sinuses are well-aerated. The included ocular globes and orbital contents are non-suspicious.  OTHER: None.  IMPRESSION: Stable, normal noncontrast MRI head for age.  Results/Tests Pending at Time of Discharge: None  Discharge Medications:    Medication List    STOP taking these medications   diltiazem 120 MG 24 hr capsule Commonly known as:  CARDIZEM CD   doxazosin 4 MG tablet Commonly known as:  CARDURA     TAKE these medications   albuterol 108 (90 Base) MCG/ACT inhaler Commonly known as:  PROVENTIL HFA;VENTOLIN HFA Inhale 2 puffs into the lungs every 6 (six) hours as needed for wheezing or shortness of breath.   atorvastatin 40 MG tablet Commonly known as:  LIPITOR Take 40 mg by mouth daily at 6 PM.   calcium carbonate 600 MG Tabs tablet Commonly known as:  OS-CAL Take 600 mg by mouth daily.   cholecalciferol 1000 units tablet Commonly known as:  VITAMIN D Take 1,000 Units by mouth 2 (two) times daily.   Cranberry 500 MG Caps Take 500 mg by mouth 2 (two) times daily.   dofetilide 250 MCG capsule Commonly known as:  [name] take 1 capsule by mouth twice a day What changed:  See the new instructions.   DULERA 200-5 MCG/ACT Aero Generic drug:  mometasone-formoterol Inhale 2 puffs into the lungs 2 (two) times daily.   DULoxetine 60 MG capsule Commonly known  as:  CYMBALTA Take 60 mg by mouth at bedtime.   esomeprazole 40 MG capsule Commonly known as:  NEXIUM Take 1 capsule (40 mg total) by mouth 2 (two) times daily before a meal.   EVZIO 0.4 MG/0.4ML Soaj Generic drug:  Naloxone HCl Inject 0.4 mLs as directed daily as needed (over dose).   ferrous sulfate 325 (65 FE) MG tablet Take 325 mg by mouth daily with breakfast.   fluticasone 50 MCG/ACT nasal spray Commonly known as:  FLONASE Place 2 sprays into both nostrils daily.   Insulin Glargine 100 UNIT/ML Solostar Pen Commonly known as:  LANTUS SOLOSTAR Inject 30 Units into the skin at bedtime. What changed:  how much to take   levETIRAcetam 500 MG 24 hr tablet Commonly known as:  KEPPRA XR Take 4 tablets (2,000 mg total) by mouth at bedtime. What changed:  how much to take  when to take this  additional instructions   loratadine 10 MG tablet Commonly known as:  CLARITIN take 1 tablet by mouth once daily What changed:  See the new instructions.   Magnesium Oxide 400 (240 Mg) MG Tabs take 1 tablet by mouth once daily What changed:  See the new instructions.   metoprolol tartrate 25 MG tablet Commonly known as:  LOPRESSOR Take 0.5 tablets (12.5 mg total) by mouth 2 (two) times daily.   nitroGLYCERIN 0.4 MG SL tablet  Commonly known as:  NITROSTAT Place 1 tablet (0.4 mg total) under the tongue every 5 (five) minutes as needed for chest pain (do nto exceed 3 doses).   NOVOLOG FLEXPEN 100 UNIT/ML FlexPen Generic drug:  insulin aspart Inject 18-22 Units as directed 2 (two) times daily. Per sliding scale   ondansetron 4 MG disintegrating tablet Commonly known as:  ZOFRAN ODT Take 1 tablet (4 mg total) by mouth every 8 (eight) hours as needed for nausea or vomiting.   PERCOCET 10-325 MG tablet Generic drug:  oxyCODONE-acetaminophen Take 1 tablet by mouth 5 (five) times daily. (scheduled)   potassium chloride 10 MEQ tablet Commonly known as:  K-DUR,KLOR-CON Take 10  mEq by mouth daily.   pregabalin 100 MG capsule Commonly known as:  LYRICA Take 100 mg by mouth 3 (three) times daily.   REFRESH OP Place 1 drop into both eyes as needed (for dry eyes).   vitamin C 500 MG tablet Commonly known as:  ASCORBIC ACID Take 500 mg by mouth 2 (two) times daily.   VOLTAREN 1 % Gel Generic drug:  diclofenac sodium Apply 2 g topically 4 (four) times daily as needed. For pain   XARELTO 20 MG Tabs tablet Generic drug:  rivaroxaban TAKE 1 TABLET BY MOUTH ONCE DAILY WITH SUPPER What changed:  See the new instructions.       Discharge Instructions: Please refer to Patient Instructions section of EMR for full details.  Patient was counseled important signs and symptoms that should prompt return to medical care, changes in medications, dietary instructions, activity restrictions, and follow up appointments.   Follow-Up Appointments: Follow-up Information    PENUMALLI,VIKRAM, MD. Go on 06/12/2016.   Specialties:  Neurology, Radiology Why:  Neurology follow up appointment at 11:30 AM Contact information: [address] [place] [address] [phone]        Charlton Haws, MD. Go on 07/15/2016.   Specialty:  Cardiology Why:  Cardiology follow up appointment at 3:30 PM Contact information: [address] [place] [address] [phone]        Wendee Beavers, DO. Go on 05/19/2016.   Why:  PCP follow up appointment at 2:00 PM Contact information: [address] [place] [address] [phone]           Wendee Beavers, DO 05/14/2016, 3:27 PM PGY-1, [facility]

## 2016-05-13 NOTE — Progress Notes (Signed)
   05/13/16 1310  Clinical Encounter Type  Visited With Patient  Visit Type Other (Comment) (consult)  Referral From Nurse  Consult/Referral To Chaplain  Spiritual Encounters  Spiritual Needs Prayer;Emotional  Stress Factors  Patient Stress Factors Major life changes  Stopped by room to offer emotional support and prayer.

## 2016-05-13 NOTE — Progress Notes (Signed)
Physician notified of HR of 55.  Received an order to hold Lopressor

## 2016-05-13 NOTE — Progress Notes (Signed)
Family Medicine Teaching Service Daily Progress Note Intern Pager: [phone]  Patient name: Toni RosenthalLinda F [name] Medical record number: [id number] Date of birth: 1951-02-19 Age: 65 y.o. Gender: female  Primary Care Provider: Danella MaiersAsiyah Z Mikell, MD Consultants: Cardiology Code Status: FULL  Pt Overview and Major Events to Date:  9/26:  Admit for syncopal episode and breakthrough seizure based on history 9/27:  Cardiology consulted concerning HR 60 on low dose diltiazem for paroxysmal a-fib  Assessment and Plan: [name] is a 65 y.o. female presenting with dizziness, syncope, possible seizure like activity, and right sided weakness. PMH is significant for seizure disorder, HTN, T2DM, HLD, PAF, bipolar disorder, chronic back pain  #Syncope with R sided weakness- recently came to ED on 9/24 after syncopal event at church that may have included seizure like activity. Head CT negative for acute process. Patient reports feeling weak and dizzy, especially when she stands up. Has history of orthostatic hypotension and +[facility]. Also presenting with new right sided weakness that is concerning for possible stroke. MRI was neg. Cardiology consulted concerning HR 60 on low dose diltiazem for paroxysmal a-fib. Her syncope could be related to low BP. - Admit to telemetry - Vitals per unit - Obtain orthostatic vitals - Urinalysis showed small leuks, urine culture pending - Will hold off on echo for now as part of syncopal work up but could be considered  - Consult to cardiology, appreciate recs  #Seizures: has been well controlled on home regimen per patient but may have had some seizure like activity recently, grandson caught her and told her she was shaking, reported incontinence during this event. Denies missing doses of keppra. Patient has slow speech in clinic and some slowed movements which could be concerning for post-ictal state.  - Continue home keppra 2.5 grams at  bedtime - Obtain keppra level - Discussed with neurology who recommended that given patient has been well controlled on Keppra at essentially max dose, they would not recommend adjustment of medications and instead would look for other causes of potential seizure activity  - Neurology did not feel that EEG would be particularly useful in patient with known seizure disorder   #HTN- blood pressure 110/75 in clinic today. Per previous cardiology notes, could tolerate some elevation in systolic BP due to diabetic autonomic dysfunction and some orthostasis. - Monitor pressures  #Type 2 DM- last A1C in July 2017 at 8.6 - Continue Lantus 20 units at night - Moderate SSI - CBGS AC/HS - Continue lyrica  #Hyperlipidemia - Continue Lipitor 40mg  daily  #Paroxysmal Atrial Fibrillation: Rate controlled at admission. EKG with QTc of 480. Mag at 2.0. - Continue xarelto  - Continue Diltiazem for rate control  - Continue [name], will monitor QTc and d/c for >500  - EKG in AM pending - Monitor K daily, keep above 4.0 due to [name] use  - Monitor Mag daily, keep above 2.0, ordered 1 g Mag 9/27  #Hx of bipolar disorder - Continue cymbalta  #Asthma - Continue home albuterol and Dulera  #Bladder retention: patient endorsing lower abdominal discomfort since stopping the doxazosin, good UO day of admission, will bladder scan today. Bladder scan 9/27 showed 448 but patient voided 400. No conerns at this time.  - Discontinue home doxazosin as this could contribute to orthostatic hypotension   FEN/GI: regular diet Prophylaxis: on home Xarelto  Disposition: pending work up for syncope and breakthrough seizure  Subjective:  Afebrile and stable. Patient concerned about her  fall and does not recall what happened or if she had trauma. Says she has not missed her keppra.  Objective: Temp:  [98.1 F (36.7 C)-98.3 F (36.8 C)] 98.1 F (36.7 C) (09/27 0605) Pulse Rate:  [59-66] 60 (09/27  0605) Resp:  [18] 18 (09/27 0605) BP: (110-155)/(62-75) 149/67 (09/27 0605) SpO2:  [95 %-97 %] 95 % (09/27 1610) Physical Exam: General: well nourished, well developed, appears to be worried about cause for syncope with non-toxic appearance HEENT: PERRLA, EOMI, normocephalic, atraumatic, moist mucous membranes Neck: supple, non-tender without lymphadenopathy CV: regular rate and rhythm without murmurs, rubs, or gallops Lungs: clear to auscultation bilaterally with normal work of breathing Abdomen: soft, non-tender, no masses or organomegaly palpable, normoactive bowel sounds Skin: warm, dry, no rashes or lesions, cap refill < 2 seconds Extremities: warm and well perfused, normal tone, equal weakness to motor strength all 4 extremities Neuro: alert and oriented x3, resting tremor of right UE, decreased fine motor skills with right UE Psych: appropriate mood and affect  Laboratory:  Recent Labs Lab 05/10/16 1106 05/12/16 1924  WBC 3.7* 4.3  HGB 14.2 12.4  HCT 43.5 38.8  PLT 206 175    Recent Labs Lab 05/10/16 1106 05/12/16 1924  NA 138 139  K 4.0 4.4  CL 108 103  CO2 18* 25  BUN 15 13  CREATININE 1.53* 1.08*  CALCIUM 9.5 9.7  PROT 8.2* 7.4  BILITOT 0.6 0.6  ALKPHOS 98 90  ALT 18 18  AST 25 25  GLUCOSE 167* 100*   Imaging/Diagnostic Tests: MR Brain Wo Contrast (05/12/2016) FINDINGS: BRAIN: No reduced diffusion to suggest acute ischemia. No susceptibility artifact to suggest hemorrhage. The ventricles and sulci are normal for patient's age. No suspicious parenchymal signal, masses or mass effect. No abnormal extra-axial fluid collections. No extra-axial masses though, contrast enhanced sequences would be more sensitive. Symmetric normal size, morphology and signal of the bilateral hippocampus.  VASCULAR: Normal major intracranial vascular flow voids present at skull base.  SKULL AND UPPER CERVICAL SPINE: No abnormal sellar expansion. No suspicious calvarial  bone marrow signal. Craniocervical junction maintained.  SINUSES/ORBITS: The mastoid air-cells and included paranasal sinuses are well-aerated. The included ocular globes and orbital contents are non-suspicious.  OTHER: None.  IMPRESSION: Stable, normal noncontrast MRI head for age.    Wendee Beavers, DO 05/13/2016, 9:29 AM PGY-1, [place] [facility] Intern pager: [phone], text pages welcome

## 2016-05-13 NOTE — Evaluation (Signed)
Physical Therapy Evaluation Patient Details Name: Toni RosenthalLinda F Lumpkin MRN: [id number] DOB: 25-Feb-1951 Today's Date: 05/13/2016   History of Present Illness  65 y/o female with PMH Seizure Disorder, HTN, T2DM PAF, and Bipolar Disorder admitted with multiple neurologic symptoms including dizziness, possible seizure vs syncope, facial numbness, right UE and LE numbness, and intermittent slurred speech present over the past 3-4 days.   Clinical Impression  Pt admitted with/for multiple neurologic symptoms suggesting seizure.  Pt currently limited functionally due to the problems listed below.  (see problems list.)  Pt will benefit from PT to maximize function and safety to be able to get home safely with available assist of family.     Follow Up Recommendations Home health PT;Supervision for mobility/OOB;Supervision/Assistance - 24 hour    Equipment Recommendations  Rolling walker with 5" wheels;None recommended by PT    Recommendations for Other Services       Precautions / Restrictions Precautions Precautions: Fall      Mobility  Bed Mobility Overal bed mobility: Needs Assistance Bed Mobility: Supine to Sit;Sit to Supine     Supine to sit: Min guard Sit to supine: Min guard   General bed mobility comments: some difficulty getting her legs moved onto and off of the bed, but no physical assist needed, just more time.  Transfers Overall transfer level: Needs assistance Equipment used: Rolling walker (2 wheeled) Transfers: Sit to/from Stand Sit to Stand: Supervision         General transfer comment: cues for hand placement  Ambulation/Gait Ambulation/Gait assistance: Min assist Ambulation Distance (Feet): 80 Feet Assistive device: Rolling walker (2 wheeled) Gait Pattern/deviations: Step-through pattern Gait velocity: slower Gait velocity interpretation: Below normal speed for age/gender General Gait Details: mildly unsteady and tentative  Stairs             Wheelchair Mobility    Modified Rankin (Stroke Patients Only)       Balance Overall balance assessment: Needs assistance Sitting-balance support: No upper extremity supported Sitting balance-Leahy Scale: Fair     Standing balance support: Single extremity supported;No upper extremity supported Standing balance-Leahy Scale: Fair                               Pertinent Vitals/Pain Pain Assessment: Faces Faces Pain Scale: Hurts even more Pain Location: proximal legs Pain Descriptors / Indicators: Cramping;Sore;Spasm Pain Intervention(s): Monitored during session;Repositioned    Home Living Family/patient expects to be discharged to:: Private residence Living Arrangements: Other relatives (grandson) Available Help at Discharge: Family;Available PRN/intermittently;Other (Comment) (son is gone days--?school.) Type of Home: House Home Access: Stairs to enter Entrance Stairs-Rails: Doctor, general practiceight;Left Entrance Stairs-Number of Steps: 3 Home Layout: One level        Prior Function Level of Independence: Independent               Hand Dominance        Extremity/Trunk Assessment   Upper Extremity Assessment: Defer to OT evaluation           Lower Extremity Assessment: Generalized weakness;RLE deficits/detail;LLE deficits/detail RLE Deficits / Details: spasms, generalized weakness maybe a little weaker overall than L LE LLE Deficits / Details: generally weak, spasms and sore     Communication   Communication: No difficulties  Cognition Arousal/Alertness: Awake/alert Behavior During Therapy: WFL for tasks assessed/performed Overall Cognitive Status: Within Functional Limits for tasks assessed  General Comments General comments (skin integrity, edema, etc.): BPs-- lying 121/56, sitting  137/66 and standing 120/64,  Initially pt reporting dizziness, which led to aborting the gait.  then later we mobilized when BP wasn't a  factor.    Exercises     Assessment/Plan    PT Assessment Patient needs continued PT services  PT Problem List Decreased strength;Decreased activity tolerance;Decreased balance;Decreased mobility;Decreased knowledge of use of DME;Impaired tone;Pain          PT Treatment Interventions Gait training;DME instruction;Stair training;Functional mobility training;Therapeutic activities;Balance training;Patient/family education    PT Goals (Current goals can be found in the Care Plan section)  Acute Rehab PT Goals Patient Stated Goal: home, able to do for myself PT Goal Formulation: With patient Time For Goal Achievement: 05/20/16 Potential to Achieve Goals: Good    Frequency Min 3X/week   Barriers to discharge Decreased caregiver support (maybe able to get some grandchildren to assist days.)      Co-evaluation               End of Session   Activity Tolerance: Patient tolerated treatment well;Patient limited by fatigue Patient left: in bed;with call bell/phone within reach;with bed alarm set Nurse Communication: Mobility status    Functional Assessment Tool Used: clinical judgement Functional Limitation: Mobility: Walking and moving around Mobility: Walking and Moving Around Current Status (Z6109): At least 1 percent but less than 20 percent impaired, limited or restricted Mobility: Walking and Moving Around Goal Status [phone]): At least 1 percent but less than 20 percent impaired, limited or restricted    Time: 1329-1405 PT Time Calculation (min) (ACUTE ONLY): 36 min   Charges:   PT Evaluation $PT Eval Moderate Complexity: 1 Procedure PT Treatments $Gait Training: 8-22 mins   PT G Codes:   PT G-Codes **NOT FOR INPATIENT CLASS** Functional Assessment Tool Used: clinical judgement Functional Limitation: Mobility: Walking and moving around Mobility: Walking and Moving Around Current Status (U9811): At least 1 percent but less than 20 percent impaired, limited or  restricted Mobility: Walking and Moving Around Goal Status [phone]): At least 1 percent but less than 20 percent impaired, limited or restricted    Chozen Latulippe, Eliseo Gum 05/13/2016, 5:00 PM 05/13/2016  [place] Bing, PT [phone]  (pager)

## 2016-05-13 NOTE — Progress Notes (Signed)
Interim progress Note:   Called by nursing for pt concerns with chest pain. EKG STAT ordered. On my assessment, patient is lying in bed with dull pain in epigastric area, nonradiating, similar to pain that she has had in the past which brought her to the ED and was relieved by Gi cocktail. Ordered 1 nitro stat, will order GI cocktail PRN. Trop order placed, stat. EKG NSR per my read.

## 2016-05-14 DIAGNOSIS — I480 Paroxysmal atrial fibrillation: Secondary | ICD-10-CM | POA: Diagnosis not present

## 2016-05-14 DIAGNOSIS — R55 Syncope and collapse: Secondary | ICD-10-CM | POA: Diagnosis not present

## 2016-05-14 DIAGNOSIS — I4819 Other persistent atrial fibrillation: Secondary | ICD-10-CM

## 2016-05-14 LAB — GLUCOSE, CAPILLARY
GLUCOSE-CAPILLARY: 151 mg/dL — AB (ref 65–99)
GLUCOSE-CAPILLARY: 186 mg/dL — AB (ref 65–99)
Glucose-Capillary: 137 mg/dL — ABNORMAL HIGH (ref 65–99)

## 2016-05-14 LAB — LEVETIRACETAM LEVEL: LEVETIRACETAM: 29 ug/mL (ref 10.0–40.0)

## 2016-05-14 MED ORDER — METOPROLOL TARTRATE 12.5 MG HALF TABLET
12.5000 mg | ORAL_TABLET | Freq: Two times a day (BID) | ORAL | Status: DC
  Administered 2016-05-14: 12.5 mg via ORAL
  Filled 2016-05-14: qty 1

## 2016-05-14 MED ORDER — METOPROLOL TARTRATE 25 MG PO TABS: 12.5000 mg | ORAL_TABLET | Freq: Two times a day (BID) | ORAL | 0 refills | Status: DC

## 2016-05-14 NOTE — Progress Notes (Signed)
Physical Therapy Treatment Patient Details Name: Toni RosenthalLinda F [name] MRN: [id number] DOB: 10-03-1950 Today's Date: 05/14/2016    History of Present Illness 65 y/o female with PMH Seizure Disorder, HTN, T2DM PAF, and Bipolar Disorder admitted with multiple neurologic symptoms including dizziness, possible seizure vs syncope, facial numbness, right UE and LE numbness, and intermittent slurred speech present over the past 3-4 days.     PT Comments    Progressing steadily.  Still feels tired, quick to fatigue, and R leg more uncoordinated and unsteady during gait than left.  Pt will need to have family back up for nearly 24 hour assist or at least supervision during gait.  Follow Up Recommendations  Home health PT;Supervision for mobility/OOB;Supervision/Assistance - 24 hour     Equipment Recommendations  Cane    Recommendations for Other Services       Precautions / Restrictions Precautions Precautions: Fall    Mobility  Bed Mobility Overal bed mobility: Needs Assistance Bed Mobility: Supine to Sit     Supine to sit: Supervision (with rail)        Transfers Overall transfer level: Needs assistance Equipment used: Rolling walker (2 wheeled) Transfers: Sit to/from Stand Sit to Stand: Supervision         General transfer comment: cues for hand placement  Ambulation/Gait Ambulation/Gait assistance: Min assist Ambulation Distance (Feet): 100 Feet (then additional 60 feet) Assistive device: Rolling walker (2 wheeled) Gait Pattern/deviations: Step-through pattern Gait velocity: slower Gait velocity interpretation: Below normal speed for age/gender General Gait Details: wobble/mod instability at R knee likely due to a combination of weakness and neuropathy.  Pt finally "feels" the level of flexion in her knee prior to it buckling and extends, but the result is an uncoordinated knee.  Pt also takes a quicker step on the left in attempt to add stability,.   Stairs             Wheelchair Mobility    Modified Rankin (Stroke Patients Only)       Balance Overall balance assessment: Modified Independent Sitting-balance support: No upper extremity supported Sitting balance-Leahy Scale: Good       Standing balance-Leahy Scale: Fair                      Cognition Arousal/Alertness: Awake/alert Behavior During Therapy: WFL for tasks assessed/performed;Flat affect Overall Cognitive Status: Within Functional Limits for tasks assessed                      Exercises      General Comments        Pertinent Vitals/Pain Pain Assessment: Faces Faces Pain Scale: Hurts even more Pain Location: R leg Pain Descriptors / Indicators: Spasm Pain Intervention(s): Monitored during session;Repositioned    Home Living                      Prior Function            PT Goals (current goals can now be found in the care plan section) Acute Rehab PT Goals Patient Stated Goal: home, able to do for myself PT Goal Formulation: With patient Time For Goal Achievement: 05/20/16 Potential to Achieve Goals: Good Progress towards PT goals: Progressing toward goals    Frequency    Min 3X/week      PT Plan Current plan remains appropriate    Co-evaluation             End of Session  Activity Tolerance: Patient tolerated treatment well;Patient limited by fatigue Patient left: in chair;with call bell/phone within reach;with chair alarm set     Time: 1206-1238 PT Time Calculation (min) (ACUTE ONLY): 32 min  Charges:  $Gait Training: 8-22 mins $Therapeutic Activity: 8-22 mins                    G Codes:      Ormand Senn, Eliseo Gum 05/14/2016, 1:34 PM 05/14/2016   Bing, PT [phone]  (pager)

## 2016-05-14 NOTE — Progress Notes (Signed)
Family Medicine Teaching Service Daily Progress Note Intern Pager: [phone]  Patient name: [name] Medical record number: [id number] Date of birth: Feb 26, 1951 Age: 65 y.o. Gender: female  Primary Care Provider: Danella Maiers, MD Consultants: Cardiology Code Status: FULL  Pt Overview and Major Events to Date:  9/26:  Admit for syncopal episode and breakthrough seizure based on history 9/27:  Cardiology consulted concerning HR 60 on low dose diltiazem for paroxysmal a-fib  Assessment and Plan: [name] is a 65 y.o. female presenting with dizziness, syncope, possible seizure like activity, and right sided weakness. PMH is significant for seizure disorder, HTN, T2DM, HLD, PAF, bipolar disorder, chronic back pain  #Syncope with R sided weakness: recently came to ED on 9/24 after syncopal event at church that may have included seizure like activity. Head CT negative for acute process. Patient reports feeling weak and dizzy, especially when she stands up. Has history of orthostatic hypotension and +orthostatic vital signs in [facility] this AM. Also presenting with new right sided weakness that is concerning for possible stroke. MRI was neg. Right UE weakness improved. Cardiology consulted concerning HR 60 on low dose diltiazem for paroxysmal a-fib. Her syncope could be related to low BP. - Admit to telemetry - Vitals per unit - Obtain orthostatic vitals - Urinalysis showed small leuks, urine culture pending - Will hold off on echo for now as part of syncopal work up but could be considered  - Consult to cardiology, appreciate recs  #Seizures: has been well controlled on home regimen per patient but may have had some seizure like activity recently, grandson caught her and told her she was shaking, reported incontinence during this event. Denies missing doses of keppra. Patient has slow speech in clinic and some slowed movements which could be concerning for post-ictal state.  - Continue  home keppra 2.5 grams at bedtime - Obtain keppra level - Discussed with neurology who recommended that given patient has been well controlled on Keppra at essentially max dose, they would not recommend adjustment of medications and instead would look for other causes of potential seizure activity  - Neurology did not feel that EEG would be particularly useful in patient with known seizure disorder   #HTN- blood pressure 110/75 in clinic today. Per previous cardiology notes, could tolerate some elevation in systolic BP due to diabetic autonomic dysfunction and some orthostasis. - Monitor pressures  #Type 2 DM- last A1C in July 2017 at 8.6 - Continue Lantus 20 units at night - Moderate SSI - CBGS AC/HS - Continue lyrica  #Hyperlipidemia - Continue Lipitor 40mg  daily  #Paroxysmal Atrial Fibrillation: Rate controlled at admission. EKG with QTc of 480>447. Mag at 2.0. - Continue xarelto  - Stopped Diltiazem for rate control, start Lopressor low dose, held for HR 55 9/27 - Continue [name], will monitor QTc and d/c for >500  - Monitor K daily, keep above 4.0 due to [name] use  - Monitor Mag daily, keep above 2.0, ordered 1 g Mag 9/27  #Hx of bipolar disorder - Continue cymbalta  #Asthma - Continue home albuterol and Dulera  #Bladder retention: patient endorsing lower abdominal discomfort since stopping the doxazosin, good UO day of admission, will bladder scan today. Bladder scan 9/27 showed 448 but patient voided 400. No conerns at this time.  - Discontinue home doxazosin as this could contribute to orthostatic hypotension   FEN/GI: regular diet Prophylaxis: on home Xarelto  Disposition: cardiology signed off, OT rec HH OT with tub/shower seat, anticipate  d/c  Subjective:  Afebrile and stable. Patient says she feels much better and ate most of her breakfast. Improved right UE weakness per patient. Discussed medication change and patient understands not to take Cardizem.    Objective: Temp:  [97.7 F (36.5 C)-98.4 F (36.9 C)] 98.4 F (36.9 C) (09/28 0617) Pulse Rate:  [55-64] 61 (09/28 0617) Resp:  [18-19] 18 (09/28 0617) BP: (126-146)/(58-75) 146/75 (09/28 0617) SpO2:  [97 %-99 %] 97 % (09/28 0617) Physical Exam: General: well nourished, well developed, appears to be worried about cause for syncope with non-toxic appearance HEENT: PERRLA, EOMI, normocephalic, atraumatic, moist mucous membranes Neck: supple, non-tender without lymphadenopathy CV: regular rate and rhythm without murmurs, rubs, or gallops Lungs: clear to auscultation bilaterally with normal work of breathing Abdomen: soft, non-tender, no masses or organomegaly palpable, normoactive bowel sounds Skin: warm, dry, no rashes or lesions, cap refill < 2 seconds Extremities: warm and well perfused, normal tone, equal weakness to motor strength all 4 extremities Neuro: alert and oriented x3, improved resting tremor of right UE, decreased fine motor skills with right UE Psych: appropriate mood and affect  Laboratory:  Recent Labs Lab 05/10/16 1106 05/12/16 1924  WBC 3.7* 4.3  HGB 14.2 12.4  HCT 43.5 38.8  PLT 206 175    Recent Labs Lab 05/10/16 1106 05/12/16 1924 05/13/16 0538  NA 138 139 140  K 4.0 4.4 4.1  CL 108 103 107  CO2 18* 25 22  BUN 15 13 11   CREATININE 1.53* 1.08* 1.06*  CALCIUM 9.5 9.7 9.3  PROT 8.2* 7.4  --   BILITOT 0.6 0.6  --   ALKPHOS 98 90  --   ALT 18 18  --   AST 25 25  --   GLUCOSE 167* 100* 127*   Imaging/Diagnostic Tests: MR Brain Wo Contrast (05/12/2016) FINDINGS: BRAIN: No reduced diffusion to suggest acute ischemia. No susceptibility artifact to suggest hemorrhage. The ventricles and sulci are normal for patient's age. No suspicious parenchymal signal, masses or mass effect. No abnormal extra-axial fluid collections. No extra-axial masses though, contrast enhanced sequences would be more sensitive. Symmetric normal size, morphology and  signal of the bilateral hippocampus.  VASCULAR: Normal major intracranial vascular flow voids present at skull base.  SKULL AND UPPER CERVICAL SPINE: No abnormal sellar expansion. No suspicious calvarial bone marrow signal. Craniocervical junction maintained.  SINUSES/ORBITS: The mastoid air-cells and included paranasal sinuses are well-aerated. The included ocular globes and orbital contents are non-suspicious.  OTHER: None.  IMPRESSION: Stable, normal noncontrast MRI head for age.    Wendee Beaversavid J McMullen, DO 05/14/2016, 7:28 AM PGY-1, [facility] FPTS Intern pager: [phone], text pages welcome

## 2016-05-14 NOTE — Progress Notes (Signed)
Pt given discharge instructions, prescriptions, and care notes. Pt verbalized understanding AEB no further questions or concerns at this time. IV was discontinued, no redness, pain, or swelling noted at this time. Telemetry discontinued and Centralized Telemetry was notified. Pt left the floor via wheelchair with staff in stable condition. 

## 2016-05-14 NOTE — Discharge Instructions (Signed)
Ms. [name], you were admitted to the hospital after experiencing a seizure-like activity and a episode of passing out.  We took an MRI of your brain which did not show anything abnormal. You also were checked for electrolytes abnormalities which were all within normal limits. Neurology did not feel you would benefit from an EEG given your history of seizures and that it would not change what we would do. They recommend you continue your 2.5 keppra as prescribed and follow up with Neurology on 10/27. Please do not drive for 6 months.  You also experienced low blood pressures while here. You were seen by Cardiology who discontinued your Cardiazem and started you on 12.5 mg of Lopressor twice per day for control of your a-fib. Please do not take your Cardizem again unless told to. You will follow up with Cardiology on 10/27.  You have a hospital follow up with our clinic on 10/3.

## 2016-05-14 NOTE — Care Management Note (Signed)
Case Management Note  Patient Details  Name: Myles RosenthalLinda F Jeter MRN: [id number] Date of Birth: December 10, 1950  Subjective/Objective:                 Presented with seizure like symptoms.   Action/Plan: Plan is to d/c today with home health services(RN).  Expected Discharge Date:                  Expected Discharge Plan:  Home w Home Health Services (Resides with grandson)  In-House Referral:     Discharge planning Services  CM Consult ([facility]  Post Acute Care Choice:    Choice offered to:  Patient  DME Arranged:  Johann Capersane, Tub bench DME Agency:  [facility] (Referral made with TyroneJermaine, [phone])  HH Arranged:  RN ([facility] @ [phone]) HH [facility]  [facility]  Status of Service:  Completed, signed off  If discussed at Long Length of Stay Meetings, dates discussed:    Additional Comments:  Epifanio LeschesCole, Naoko Diperna Hudson, RN 05/14/2016, 4:37 PM

## 2016-05-14 NOTE — Evaluation (Addendum)
Occupational Therapy Evaluation Patient Details Name: Toni RosenthalLinda F [name] MRN: [id number] DOB: [birth year] Today's Date: 05/14/2016    History of Present Illness 65 y/o female with PMH Seizure Disorder, HTN, T2DM PAF, and Bipolar Disorder admitted with multiple neurologic symptoms including dizziness, possible seizure vs syncope, facial numbness, right UE and LE numbness, and intermittent slurred speech present over the past 3-4 days.    Clinical Impression   Pt admitted with the above diagnoses and presents with below problem list. Pt will benefit from continued acute OT to address the below listed deficits and maximize independence with basic ADLs prior to d/c home with family assisting. PTA pt was mostly independent with ADLs. Pt is currently min guard with OOB/LB ADLs. Pt on RA for OOB ADLs this session with O2 at 94 after ADL tasks. Pt reporting "feeling funny" towards end of session and needed 1 seated rest break for about 3 minutes. BP assessed at end of session after about 5 minutes in supine and was 144/61. Supplemental O2 via [place] reapplied at end of session for comfort. Of note, pt reports that sometimes her O2 at home drops below 90. Advised pt to keep track of what is going on when this happens (after activity? when she first wakes up?, etc.).     Follow Up Recommendations  Home health OT;Supervision/Assistance - 24 hour    Equipment Recommendations  Tub/shower seat    Recommendations for Other Services       Precautions / Restrictions Precautions Precautions: Fall      Mobility Bed Mobility Overal bed mobility: Needs Assistance Bed Mobility: Supine to Sit;Sit to Supine     Supine to sit: Min guard Sit to supine: Min guard   General bed mobility comments: Extra time  Transfers Overall transfer level: Needs assistance Equipment used: Rolling walker (2 wheeled) Transfers: Sit to/from Stand Sit to Stand: Supervision         General transfer comment: cues for hand  placement    Balance Overall balance assessment: Needs assistance Sitting-balance support: No upper extremity supported;Feet supported Sitting balance-Leahy Scale: Fair     Standing balance support: Bilateral upper extremity supported;No upper extremity supported;During functional activity Standing balance-Leahy Scale: Fair                              ADL Overall ADL's : Needs assistance/impaired Eating/Feeding: Set up;Sitting   Grooming: Wash/dry hands;Min guard;Standing   Upper Body Bathing: Set up;Sitting   Lower Body Bathing: Min guard;Sit to/from stand   Upper Body Dressing : Set up;Sitting   Lower Body Dressing: Min guard;Sit to/from stand   Toilet Transfer: Min guard;Ambulation;RW;BSC   Toileting- ArchitectClothing Manipulation and Hygiene: Min guard;Sit to/from stand;Minimal assistance   Tub/ Shower Transfer: Walk-in shower;Min guard;Ambulation;Shower seat;Grab bars;Rolling walker   Functional mobility during ADLs: Min guard;Rolling walker General ADL Comments: Pt completed toilet transfer, toileting tasks, simulated shower transfer, and grooming task as detailed above. Pt reporting "feeling funny" during OOB tasks and needed 1 seated rest break for about 3 minutes. Returned to supine in bed at end of session. See general comments for vitals assessed.      Vision Vision Assessment?: Vision impaired- to be further tested in functional context Additional Comments: vision assessment deferred to next OT visit due to onset of pt "feeling funny" towards end of session,   Perception     Praxis      Pertinent Vitals/Pain Pain Assessment: Faces Faces Pain  Scale: Hurts little more Pain Location: chronic lower back pain Pain Descriptors / Indicators: Aching Pain Intervention(s): Monitored during session;Repositioned     Hand Dominance Right   Extremity/Trunk Assessment Upper Extremity Assessment Upper Extremity Assessment: Generalized weakness;RUE  deficits/detail RUE Deficits / Details: intermittent, tremulous movements in R hand noted. Pt reports this has been going on PTA.    Lower Extremity Assessment Lower Extremity Assessment: Defer to PT evaluation       Communication Communication Communication: No difficulties   Cognition Arousal/Alertness: Awake/alert Behavior During Therapy: WFL for tasks assessed/performed;Flat affect Overall Cognitive Status: Within Functional Limits for tasks assessed                     General Comments    Pt on RA for OOB activities with O2 reading 94 after OOB activities. Pt reporting "feeling funny" towards end of session. BP assessed in supine after about 5 minutes: 161/44. [place] reapplied at end of session for comfort.    Exercises       Shoulder Instructions      Home Living Family/patient expects to be discharged to:: Private residence Living Arrangements: Other relatives (grandson) Available Help at Discharge: Family;Available PRN/intermittently;Other (Comment) (son is gone days--?school.)) Type of Home: House Home Access: Stairs to enter [employer] of Steps: 3 Entrance Stairs-Rails: Right;Left Home Layout: One level     Bathroom Shower/Tub: Producer, television/film/video: Standard     Home Equipment: Bedside commode;Walker - 2 wheels          Prior Functioning/Environment Level of Independence: Independent        Comments: occassional help with with bathing/dressing and shower transfers; sometimes my grandkids help me.        OT Problem List: Decreased activity tolerance;Decreased strength;Impaired balance (sitting and/or standing);Decreased knowledge of use of DME or AE;Decreased knowledge of precautions   OT Treatment/Interventions: Self-care/ADL training;Therapeutic exercise;Energy conservation;DME and/or AE instruction;Therapeutic activities;Patient/family education;Balance training    OT Goals(Current goals can be found in the care plan  section) Acute Rehab OT Goals Patient Stated Goal: home, able to do for myself OT Goal Formulation: With patient Time For Goal Achievement: 05/28/16 Potential to Achieve Goals: Good ADL Goals Pt Will Perform Grooming: with supervision;standing;sitting Pt Will Perform Lower Body Bathing: sit to/from stand;with modified independence Pt Will Perform Lower Body Dressing: sit to/from stand;with modified independence Pt Will Transfer to Toilet: with modified independence;ambulating Pt Will Perform Toileting - Clothing Manipulation and hygiene: with modified independence;sit to/from stand Pt Will Perform Tub/Shower Transfer: Shower transfer;with modified independence;ambulating;rolling walker;shower seat Additional ADL Goal #1: Pt will incorporate 1 energy conservation strategy into ADL task with only initial cue provided.   OT Frequency: Min 2X/week   Barriers to D/C:    reports she has multiple grandchildren who she can call to help her. Discussed lining up as close to 24/7 assistance as she can get especially for first week at home. Pt verbalizing that she does not want to go to SNF for rehab.       Co-evaluation              End of Session Equipment Utilized During Treatment: Gait belt;Rolling walker Nurse Communication: Other (comment) (O2 on RA 94, [place] reapplied, pt c/o "feeling funny," BP )  Activity Tolerance: Other (comment) (reported "feeling funny," preferring eyes closed, rest break) Patient left: in bed;with call bell/phone within reach;with bed alarm set   Time: 1610-9604 OT Time Calculation (min): 30 min Charges:  OT  General Charges $OT Visit: 1 Procedure OT Evaluation $OT Eval Low Complexity: 1 Procedure OT Treatments $Self Care/Home Management : 8-22 mins G-Codes: OT G-codes **NOT FOR INPATIENT CLASS** Functional Assessment Tool Used: clinical judgement Functional Limitation: Self care Self Care Current Status (H0865): At least 1 percent but less than 20 percent  impaired, limited or restricted Self Care Goal Status (H8469): At least 1 percent but less than 20 percent impaired, limited or restricted  Pilar Grammes 05/14/2016, 10:06 AM

## 2016-05-14 NOTE — Progress Notes (Signed)
PROGRESS NOTE  Subjective:   65 y.o. female with a PMHx of PAF, HTN, DM type 2, known seizure disorder, who was admitted to [facility] on 05/12/2016 for evaluation of a seizure  She has been having some issues with ? Orthostasis. We changed the Diltiazem to metoprolol . She still has some unsteadyness this am Does not seem to be related to HR or BP.      Objective:    Vital Signs:   Temp:  [97.7 F (36.5 C)-98.4 F (36.9 C)] 98.4 F (36.9 C) (09/28 0617) Pulse Rate:  [55-65] 60 (09/28 0933) Resp:  [18-19] 18 (09/28 0910) BP: (126-146)/(58-75) 144/61 (09/28 0933) SpO2:  [97 %-99 %] 97 % (09/28 0933)  Last BM Date: 05/11/16   24-hour weight change: Weight change:   Weight trends: There were no vitals filed for this visit.  Intake/Output:  09/27 0701 - 09/28 0700 In: 403 [P.O.:400; I.V.:3] Out: 1200 [Urine:1200] No intake/output data recorded.   Physical Exam: BP (!) 144/61 (BP Location: Right Arm)   Pulse 60   Temp 98.4 F (36.9 C) (Oral)   Resp 18   SpO2 97%   Wt Readings from Last 3 Encounters:  05/12/16 207 lb 6.4 oz (94.1 kg)  05/10/16 200 lb (90.7 kg)  04/16/16 206 lb 6.4 oz (93.6 kg)    General: Vital signs reviewed and noted.   Head: Normocephalic, atraumatic.  Eyes: conjunctivae/corneas clear.  EOM's intact.   Throat: normal  Neck:  normal   Lungs:    clear   Heart:  RR   Abdomen:  Soft, non-tender, non-distended    Extremities: No edema    Neurologic: A&O X3, CN II - XII are grossly intact.   Psych: Normal     Labs: BMET:  Recent Labs  05/12/16 1924 05/13/16 0538  NA 139 140  K 4.4 4.1  CL 103 107  CO2 25 22  GLUCOSE 100* 127*  BUN 13 11  CREATININE 1.08* 1.06*  CALCIUM 9.7 9.3  MG 2.2 2.0    Liver function tests:  Recent Labs  05/12/16 1924  AST 25  ALT 18  ALKPHOS 90  BILITOT 0.6  PROT 7.4  ALBUMIN 4.0   No results for input(s): LIPASE, AMYLASE in the last 72 hours.  CBC:  Recent Labs  05/12/16 1924  WBC  4.3  HGB 12.4  HCT 38.8  MCV 91.5  PLT 175    Cardiac Enzymes:  Recent Labs  05/13/16 1637  TROPONINI <0.03    Coagulation Studies: No results for input(s): LABPROT, INR in the last 72 hours.  Other: Invalid input(s): POCBNP No results for input(s): DDIMER in the last 72 hours. No results for input(s): HGBA1C in the last 72 hours. No results for input(s): CHOL, HDL, LDLCALC, TRIG, CHOLHDL in the last 72 hours. No results for input(s): TSH, T4TOTAL, T3FREE, THYROIDAB in the last 72 hours.  Invalid input(s): FREET3 No results for input(s): VITAMINB12, FOLATE, FERRITIN, TIBC, IRON, RETICCTPCT in the last 72 hours.   Other results:  EKG  ( personally reviewed )  -    Medications:    Infusions:    Scheduled Medications: . atorvastatin  40 mg Oral q1800  . calcium carbonate  1 tablet Oral Q breakfast  . dofetilide  250 mcg Oral BID  . DULoxetine  60 mg Oral QHS  . insulin aspart  0-15 Units Subcutaneous TID WC  . insulin glargine  20 Units Subcutaneous QHS  . levETIRAcetam  2,000 mg Oral QHS  . levETIRAcetam  500 mg Oral Daily  . loratadine  10 mg Oral Daily  . metoprolol tartrate  12.5 mg Oral BID  . mometasone-formoterol  2 puff Inhalation BID  . pantoprazole  80 mg Oral Q1200  . pregabalin  100 mg Oral TID  . rivaroxaban  20 mg Oral Daily  . sodium chloride flush  3 mL Intravenous Q12H    Assessment/ Plan:   Active Problems:   Seizure (HCC)   Facial numbness   Persistent atrial fibrillation (HCC)  1.  Paroxysmal atrial fib:   Has known PAF.   Unrelated to her sezures. There is a question of orthostatic hypotension .   I have changed the Diltizem to metoprolol 12.5 BID to see if this helps.  She has an unsteadyness to her gait that is clearly not related to HR or BP .   She had difficulty walking from the bathroom this am and her BP and HR are fine. Was seen by OT. She may need a neuro eval for this. It's not a cardiac issue from what we can tell and is  clearly not related to her PAF .   Will sign off. Call for questions  Disposition:  Length of Stay: 1  Vesta MixerPhilip J. Nahser, Montez HagemanJr., MD, [facility] 05/14/2016, 10:14 AM Office [phone] Pager [phone]

## 2016-05-18 ENCOUNTER — Other Ambulatory Visit: Payer: Self-pay | Admitting: Cardiovascular Disease

## 2016-05-19 ENCOUNTER — Ambulatory Visit (INDEPENDENT_AMBULATORY_CARE_PROVIDER_SITE_OTHER): Payer: Medicaid Other | Admitting: Family Medicine

## 2016-05-19 ENCOUNTER — Encounter: Payer: Self-pay | Admitting: Family Medicine

## 2016-05-19 DIAGNOSIS — R55 Syncope and collapse: Secondary | ICD-10-CM

## 2016-05-19 DIAGNOSIS — R531 Weakness: Secondary | ICD-10-CM

## 2016-05-19 DIAGNOSIS — R208 Other disturbances of skin sensation: Secondary | ICD-10-CM | POA: Diagnosis not present

## 2016-05-19 NOTE — Patient Instructions (Signed)
It was a pleasure to meet you today. Please see below to review our plan for today's visit.  1. You look much improved since your hospitalization, please continue home health physical therapy 2. You will follow up with neurology and cardiology as scheduled 3. Continue the metoprolol and tikosyn as prescribed 4. Remember not to drive for 6 months 5. Follow up at clinic in 2 month  Please call the clinic at [phone] if your symptoms worsen or you have any concerns. It was my pleasure to see you. -- Durward Parcelavid Shay Jhaveri, DO [facility], PGY-1

## 2016-05-19 NOTE — Assessment & Plan Note (Addendum)
Improved. Seeing physical therapy at home. Slow to ambulate with walker but steady gait. Neuro exam unremarkable except for motor strength weak 4/5 throughout, right equal to left. - Continue HH PT - Use walker to ambulate - Neurology f/u on 10/27 - Follow up in clinic 2 months for reassessment

## 2016-05-19 NOTE — Assessment & Plan Note (Signed)
Controlled. BP 136/76 with HR 55. Denies symptoms of lightheadedness, palpitations, or LOC since discharge from hospital on 05/14/16. H/o paroxsymal a-fib controlled on tikosyn 250 mcg daily and metoprolol 25 mg BID. Patient was d/c on metoprolol for rate control. - F/u cardiology on 11/29 - Continue metoprolol despite HR in 50's as per cards rec during hospitalization - Continue tikosyn - Patient is aware to be careful when standing and ambulating to avoid falls - Discontinued doxazosin for bladder retention due to syncopal history, patient w/o urinary retention - F/u in 2 months

## 2016-05-19 NOTE — Progress Notes (Signed)
Subjective:   Patient ID: Toni RosenthalLinda F Berhow    DOB: March 03, 1951, 65 y.o. female   MRN: [id number]  CC: Right sided weakness  HPI: [name] is a 65 y.o. female who presents to clinic today for hospital f/u after experiancing seizure-like activity and right sided weakness. Problems discussed today are as follows:  Right Sided Weakness: [facility]-sided weakness 9/26-9/28 in the setting of seizure-like activity.  Patient feels symptoms have improved to 40% from baseline. Patient has been seeing HH PT. Using walker to ambulate. Lives at home with grandson.   Syncope: history of syncopal episode during admission, resolved since hospitalization. Denies lightheaded, palpitations, change in vision, LOC. Patient is careful to get up quickly.  ROS: See HPI for pertinent ROS.  PMFSH: Pertinent past medical, surgical, family, and social history were reviewed and updated as appropriate. Smoking status reviewed.  Medications reviewed. Current Outpatient Prescriptions  Medication Sig Dispense Refill  . albuterol (PROVENTIL HFA;VENTOLIN HFA) 108 (90 BASE) MCG/ACT inhaler Inhale 2 puffs into the lungs every 6 (six) hours as needed for wheezing or shortness of breath. 1 Inhaler 1  . atorvastatin (LIPITOR) 40 MG tablet Take 40 mg by mouth daily at 6 PM.   0  . calcium carbonate (OS-CAL) 600 MG TABS tablet Take 600 mg by mouth daily.    . cholecalciferol (VITAMIN D) 1000 UNITS tablet Take 1,000 Units by mouth 2 (two) times daily.     . Cranberry 500 MG CAPS Take 500 mg by mouth 2 (two) times daily.    Marland Kitchen. dofetilide ([name]) 250 MCG capsule take 1 capsule by mouth twice a day (Patient taking differently: Take 250 mcg by mouth twice daily) 180 capsule 3  . DULoxetine (CYMBALTA) 60 MG capsule Take 60 mg by mouth at bedtime.     Marland Kitchen. esomeprazole (NEXIUM) 40 MG capsule Take 1 capsule (40 mg total) by mouth 2 (two) times daily before a meal. 60 capsule 6  . EVZIO 0.4 MG/0.4ML SOAJ Inject  0.4 mLs as directed daily as needed (over dose).   0  . ferrous sulfate 325 (65 FE) MG tablet Take 325 mg by mouth daily with breakfast.    . fluticasone (FLONASE) 50 MCG/ACT nasal spray Place 2 sprays into both nostrils daily. 16 g 6  . Insulin Glargine (LANTUS SOLOSTAR) 100 UNIT/ML Solostar Pen Inject 30 Units into the skin at bedtime. (Patient taking differently: Inject 40 Units into the skin at bedtime. )    . levETIRAcetam (KEPPRA XR) 500 MG 24 hr tablet Take 4 tablets (2,000 mg total) by mouth at bedtime. (Patient taking differently: Take 500-2,000 mg by mouth See admin instructions. Take 500 mg by mouth in the morning and take 2000 mg by mouth at bedtime.) 360 tablet 4  . loratadine (CLARITIN) 10 MG tablet take 1 tablet by mouth once daily (Patient taking differently: Take 10 mg by mouth once daily) 30 tablet 1  . Magnesium Oxide 400 (240 Mg) MG TABS take 1 tablet by mouth once daily (Patient taking differently: Take 400 mg by mouth once daily) 30 tablet 3  . metoprolol tartrate (LOPRESSOR) 25 MG tablet Take 0.5 tablets (12.5 mg total) by mouth 2 (two) times daily. 60 tablet 0  . mometasone-formoterol (DULERA) 200-5 MCG/ACT AERO Inhale 2 puffs into the lungs 2 (two) times daily.    . nitroGLYCERIN (NITROSTAT) 0.4 MG SL tablet Place 1 tablet (0.4 mg total) under the tongue every 5 (five) minutes as needed  for chest pain (do nto exceed 3 doses). 25 tablet 2  . NOVOLOG FLEXPEN 100 UNIT/ML FlexPen Inject 18-22 Units as directed 2 (two) times daily. Per sliding scale  0  . ondansetron (ZOFRAN ODT) 4 MG disintegrating tablet Take 1 tablet (4 mg total) by mouth every 8 (eight) hours as needed for nausea or vomiting. 10 tablet 0  . oxyCODONE-acetaminophen (PERCOCET) 10-325 MG tablet Take 1 tablet by mouth 5 (five) times daily. (scheduled)     . Polyvinyl Alcohol-Povidone (REFRESH OP) Place 1 drop into both eyes as needed (for dry eyes).    . potassium chloride (K-DUR,KLOR-CON) 10 MEQ tablet Take 10 mEq  by mouth daily.    . pregabalin (LYRICA) 100 MG capsule Take 100 mg by mouth 3 (three) times daily.    . vitamin C (ASCORBIC ACID) 500 MG tablet Take 500 mg by mouth 2 (two) times daily.    . VOLTAREN 1 % GEL Apply 2 g topically 4 (four) times daily as needed. For pain  0  . XARELTO 20 MG TABS tablet TAKE 1 TABLET BY MOUTH ONCE DAILY WITH SUPPER (Patient taking differently: Take 20 mg by mouth once daily) 30 tablet 2   No current facility-administered medications for this visit.     Objective:   BP 136/76   Pulse (!) 55   Temp 97.8 F (36.6 C) (Oral)   Wt 204 lb 9.6 oz (92.8 kg)   BMI 33.02 kg/m  Vitals and nursing note reviewed.  General: well nourished, well developed, in no acute distress with non-toxic appearance HEENT: normocephalic, atraumatic, moist mucous membranes Neck: supple, non-tender without lymphadenopathy CV: regular rate and rhythm without murmurs, rubs, or gallops Lungs: clear to auscultation bilaterally with normal work of breathing Abdomen: soft, non-tender, non-distended, no masses or organomegaly palpable, normoactive bowel sounds Skin: warm, dry, no rashes or lesions, cap refill < 2 seconds Extremities: warm and well perfused, normal tone, 4/5 UE and LE motor strength bilaterally Neuro: CNII-XII intact, no facial droop, no slurring of speech, steady gate with walker  Assessment & Plan:   Right sided weakness Improved. Seeing physical therapy at home. Slow to ambulate with walker but steady gait. Neuro exam unremarkable except for motor strength weak 4/5 throughout, right equal to left. - Continue HH PT - Use walker to ambulate - Neurology f/u on 10/27 - Follow up in clinic 2 months for reassessment  Syncope Controlled. BP 136/76 with HR 55. Denies symptoms of lightheadedness, palpitations, or LOC since discharge from hospital on 05/14/16. H/o paroxsymal a-fib controlled on tikosyn 250 mcg daily and metoprolol 25 mg BID. Patient was d/c on metoprolol for  rate control. - F/u cardiology on 11/29 - Continue metoprolol despite HR in 50's as per cards rec during hospitalization - Continue tikosyn - Patient is aware to be careful when standing and ambulating to avoid falls - Discontinued doxazosin for bladder retention due to syncopal history, patient w/o urinary retention - F/u in 2 months  No orders of the defined types were placed in this encounter.  No orders of the defined types were placed in this encounter.   Durward Parcel, DO [facility], PGY-1 05/19/2016 3:42 PM

## 2016-05-20 ENCOUNTER — Telehealth: Payer: Self-pay | Admitting: Internal Medicine

## 2016-05-22 ENCOUNTER — Other Ambulatory Visit: Payer: Self-pay | Admitting: Internal Medicine

## 2016-05-22 DIAGNOSIS — Z1231 Encounter for screening mammogram for malignant neoplasm of breast: Secondary | ICD-10-CM

## 2016-05-26 ENCOUNTER — Telehealth: Payer: Self-pay | Admitting: Internal Medicine

## 2016-05-26 NOTE — Telephone Encounter (Signed)
Rn with [facility]: needs verbal orders to see pt 2 times per week for 2 weeks. Request was faxed over 05-20-16.

## 2016-05-27 NOTE — Telephone Encounter (Signed)
Please have the RN refax this verbal order. I don't recall having this placed in my box recently. I will fill out the order this Friday.

## 2016-06-01 ENCOUNTER — Telehealth: Payer: Self-pay | Admitting: *Deleted

## 2016-06-01 NOTE — Telephone Encounter (Signed)
Spoke with patient re: clarification on fax request from [facility] for refill on levetiracetam 500 mg tabs, takes 4 tabs every night. Per Epic note, 05/10/16 patient is now taking 500 mg in morning, 2000 mg total at bedtime. Patient stated when she was in the hospital in Sept her dose was increased to current doses due to possible seizure activity. She stated she thought Dr Marjory Lies was made aware of the increase. This RN then noted that 2 mos ago her levetiracetam blood level was subtherapeutic, and two weeks ago it was within therapeutic range.  Patient stated she has continued to take 500 mg in morning, 2000 mg at bedtime. Inquired if patient has enough medication to last 2-3 more days; she stated she does. Reminded her of her follow up on 06/12/16 and advised will discuss with Dr Marjory Lies to send proper refill instructions to [facility]. Patient verbalized understanding, appreciation.

## 2016-06-01 NOTE — Telephone Encounter (Signed)
Signed faxed order

## 2016-06-02 MED ORDER — LEVETIRACETAM ER 500 MG PO TB24: ORAL_TABLET | ORAL | 12 refills | Status: DC

## 2016-06-04 ENCOUNTER — Telehealth: Payer: Self-pay | Admitting: Internal Medicine

## 2016-06-04 NOTE — Telephone Encounter (Signed)
Her blood pressure is continuing to drop when she stands up.  Here are the readings: 06/02/16--130/64 dropping to 11/ when she stood up 05-29-16---118/70  Dropping to 110/66 05/18/16--140/80      Dropping to 120/68 06/03/16  138/70    Dropping to 140/70 Please advise

## 2016-06-05 NOTE — Telephone Encounter (Signed)
Please let patient know that there is no issues with her blood pressures, that is likely a normal variation. If she has any complaints of dizziness ectc. Associated with blood pressure then she can return

## 2016-06-12 ENCOUNTER — Ambulatory Visit (INDEPENDENT_AMBULATORY_CARE_PROVIDER_SITE_OTHER): Payer: Medicaid Other | Admitting: Diagnostic Neuroimaging

## 2016-06-12 ENCOUNTER — Encounter: Payer: Self-pay | Admitting: Diagnostic Neuroimaging

## 2016-06-12 VITALS — BP 159/83 | HR 61 | Wt 212.0 lb

## 2016-06-12 DIAGNOSIS — G40019 Localization-related (focal) (partial) idiopathic epilepsy and epileptic syndromes with seizures of localized onset, intractable, without status epilepticus: Secondary | ICD-10-CM | POA: Diagnosis not present

## 2016-06-12 MED ORDER — LEVETIRACETAM ER 500 MG PO TB24: ORAL_TABLET | ORAL | 12 refills | Status: DC

## 2016-06-12 NOTE — Progress Notes (Signed)
PATIENT: [name] DOB: 1951/06/04  HISTORY FROM: patient REASON FOR VISIT: follow up   Chief Complaint  Patient presents with  . Seizures    rm 7, several hosp visits, 1 admission- syncope, seizre activity, LOC; Keppra increased to 2500 mg  total daily"  . Follow-up    1 year    HISTORY OF PRESENT ILLNESS:  UPDATE 06/12/16 (VRP): Since last visit, has been in hospital x 3 in the last year. Went to hospital in Aug 2017 for syncope/seizure, and LEV was increase to 500mg  in AM and 2000mg  in PM. Then another syncope/seizure in Sept 2017, and LEV was kept the same. Also with intermittent "neck jerking" without loss of consciousness.  UPDATE 06/11/15 (VRP): No seizures since May 2015. Since last visit, was doing well until Jan 2016, then had hypotension, syncope, bradycardia, and 6 day admission.  Also had multiple ER evaluations for chest pain and other issues.   UPDATE 04/30/14 (VRP): Since last visit, was doing well until May 2015 (had generalized convulsive seizure) then 1 week last went to hospital and dx'd with afib. Now doing better. Not driving.   UPDATE 10/27/13 (LL): Patient returns for followup, last visit was 04/12/13.  She reports that she has not been feeling so well in the past 6 months.  She unknowingly had not been getting refills on her Leviteracetam for 2 months and started having more seizures.  She then got a refill and has been back on Leviteracetam for a month, but still reports 3 seizures during that time, mostly during the night, when she wakes up after having been incontinent of bladder and bowel and with sore muscles.  She has other times during the day when she feels that she is having them in which she feels confused and notices she has lost a few minutes, but these are unwitnessed.  She has been titrated up on her Lyrica to 100 mg TID, still has neuropathy pain, but states she is very sleepy most of the time.  UPDATE 04/12/13 (LL): Patient returns for  followup visit since last visit on 10/10/2012. She states that she has not had any more seizures or syncopal episodes since her last visit. She continues to take Keppra 500 mg twice a day with no known side effects. She reports that in earlier in the year she was placed on Neurontin 600 mg 3 times a day by another doctor and felt very badly. She reports having visual hallucinations, nausea, excessive diaphoresis. She had several trips to the emergency room this year for heart rate and blood pressure issues, chest pain, nausea and vomiting, pyelonephritis, and abdominal pain. She has been evaluated by GI, and [place] cardiology. She was taken off Neurontin which was replaced with Lyrica 50 mg 3 times a day by her pain management M.D. Dr. Kumar 2 months ago. Since this time she reports that she is doing much better. She has no complaints today.   PRIOR HPI 10/10/12 (VRP): 65 year old in female here for evaluation of seizure disorder. Patient had first seizure of life in the fifth grade. She was diagnosed with seizures and treated with phenobarbital. She had generalized convulsions, tongue biting and incontinence. She had seizures until the ninth grade and then to stop. At some point she was taken off phenobarbital because she no longer had seizures. 2012 patient began to have seizures again. She was having intermittent episodes of staring, being in a daze, followed by shaking, tongue biting, vomiting and incontinence. Patient had  an episode last night as well.    REVIEW OF SYSTEMS: Full 14 system review of systems performed and negative: chills, fatigue, excessive sweating, facial swelling light sens double vision cough SOB palpitations cold intol heat intol excessive thirst joint pain aching muscles cramps speech diff memory loss dizziness passing out depression.   ALLERGIES: Allergies  Allergen Reactions  . Prednisone Other (See Comments)    SENT INTO AFIB   . Amitriptyline Other (See Comments)     Disoriented.  . Hydromorphone Hcl Other (See Comments)    blood pressure drops.    HOME MEDICATIONS: Outpatient Medications Prior to Visit  Medication Sig Dispense Refill  . albuterol (PROVENTIL HFA;VENTOLIN HFA) 108 (90 BASE) MCG/ACT inhaler Inhale 2 puffs into the lungs every 6 (six) hours as needed for wheezing or shortness of breath. 1 Inhaler 1  . calcium carbonate (OS-CAL) 600 MG TABS tablet Take 600 mg by mouth daily.    . cholecalciferol (VITAMIN D) 1000 UNITS tablet Take 1,000 Units by mouth 2 (two) times daily.     . Cranberry 500 MG CAPS Take 500 mg by mouth 2 (two) times daily.    Marland Kitchen dofetilide ([name]) 250 MCG capsule take 1 capsule by mouth twice a day (Patient taking differently: Take 250 mcg by mouth twice daily) 180 capsule 3  . DULoxetine (CYMBALTA) 60 MG capsule Take 60 mg by mouth at bedtime.     Marland Kitchen esomeprazole (NEXIUM) 40 MG capsule Take 1 capsule (40 mg total) by mouth 2 (two) times daily before a meal. 60 capsule 6  . EVZIO 0.4 MG/0.4ML SOAJ Inject 0.4 mLs as directed daily as needed (over dose).   0  . ferrous sulfate 325 (65 FE) MG tablet Take 325 mg by mouth daily with breakfast.    . fluticasone (FLONASE) 50 MCG/ACT nasal spray Place 2 sprays into both nostrils daily. 16 g 6  . Insulin Glargine (LANTUS SOLOSTAR) 100 UNIT/ML Solostar Pen Inject 30 Units into the skin at bedtime. (Patient taking differently: Inject 40 Units into the skin at bedtime. )    . levETIRAcetam (KEPPRA XR) 500 MG 24 hr tablet Take 500 mg by mouth in the morning and take 2000 mg by mouth at bedtime. 150 tablet 12  . loratadine (CLARITIN) 10 MG tablet take 1 tablet by mouth once daily (Patient taking differently: Take 10 mg by mouth once daily) 30 tablet 1  . Magnesium Oxide 400 (240 Mg) MG TABS take 1 tablet by mouth once daily (Patient taking differently: Take 400 mg by mouth once daily) 30 tablet 3  . metoprolol tartrate (LOPRESSOR) 25 MG tablet Take 0.5 tablets (12.5 mg total) by mouth 2  (two) times daily. 60 tablet 0  . mometasone-formoterol (DULERA) 200-5 MCG/ACT AERO Inhale 2 puffs into the lungs 2 (two) times daily.    . nitroGLYCERIN (NITROSTAT) 0.4 MG SL tablet Place 1 tablet (0.4 mg total) under the tongue every 5 (five) minutes as needed for chest pain (do nto exceed 3 doses). 25 tablet 2  . NOVOLOG FLEXPEN 100 UNIT/ML FlexPen Inject 18-22 Units as directed 2 (two) times daily. Per sliding scale  0  . ondansetron (ZOFRAN ODT) 4 MG disintegrating tablet Take 1 tablet (4 mg total) by mouth every 8 (eight) hours as needed for nausea or vomiting. 10 tablet 0  . oxyCODONE-acetaminophen (PERCOCET) 10-325 MG tablet Take 1 tablet by mouth 5 (five) times daily. (scheduled)     . Polyvinyl Alcohol-Povidone (REFRESH OP) Place 1 drop into both  eyes as needed (for dry eyes).    . potassium chloride (K-DUR) 10 MEQ tablet take 1 tablet by mouth once daily 90 tablet 1  . potassium chloride (K-DUR,KLOR-CON) 10 MEQ tablet Take 10 mEq by mouth daily.    . pregabalin (LYRICA) 100 MG capsule Take 100 mg by mouth 3 (three) times daily.    . vitamin C (ASCORBIC ACID) 500 MG tablet Take 500 mg by mouth 2 (two) times daily.    . VOLTAREN 1 % GEL Apply 2 g topically 4 (four) times daily as needed. For pain  0  . XARELTO 20 MG TABS tablet TAKE 1 TABLET BY MOUTH ONCE DAILY WITH SUPPER (Patient taking differently: Take 20 mg by mouth once daily) 30 tablet 2  . atorvastatin (LIPITOR) 40 MG tablet Take 40 mg by mouth daily at 6 PM.   0   No facility-administered medications prior to visit.      PHYSICAL EXAM  Vitals:   06/12/16 1226  BP: (!) 159/83  Pulse: 61  Weight: 212 lb (96.2 kg)   Body mass index is 34.22 kg/m.  Physical Exam  General:  in no acute distress. Well developed and groomed.  Neck: Neck is supple.  Cardiovascular: No carotid artery bruits. Heart is regular rate and rhythm with no murmurs.   Neurologic Exam  Mental Status: Patient is awake, but drowsy.  Language is  fluent and comprehension intact.  Cranial Nerves: Pupils are equal and reactive to light. Visual fields are full to confrontation. Conjugate eye movements are full and symmetric. Facial sensation and strength are symmetric. Hearing is intact. Palate elevated symmetrically and uvula is midline. Shoulder shrug is symmetric. Tongue is midline.  Motor: Normal bulk and tone. Full strength in the upper and lower extremities. No pronator drift. TREMOR IN OUTSTRETCHED HANDS R>L. Sensory: DECR IN BLE IN STOCKING DISTRIBUTION.  Coordination: No ataxia or dysmetria on finger-nose or rapid alternating movement testing.  Gait and Station: ANTALGIC, UNSTEADY GAIT.  Reflexes: Deep tendon reflexes in the upper and lower extremity are TRACE and symmetric.    DIAGNOSTICS  Lab Results  Component Value Date   WBC 4.3 05/12/2016   HGB 12.4 05/12/2016   HCT 38.8 05/12/2016   MCV 91.5 05/12/2016   PLT 175 05/12/2016     Chemistry      Component Value Date/Time   NA 140 05/13/2016 0538   K 4.1 05/13/2016 0538   CL 107 05/13/2016 0538   CO2 22 05/13/2016 0538   BUN 11 05/13/2016 0538   CREATININE 1.06 (H) 05/13/2016 0538   CREATININE 1.32 (H) 01/16/2016 0939      Component Value Date/Time   CALCIUM 9.3 05/13/2016 0538   ALKPHOS 90 05/12/2016 1924   AST 25 05/12/2016 1924   ALT 18 05/12/2016 1924   BILITOT 0.6 05/12/2016 1924      10/17/12 MRI BRAIN - normal     ASSESSMENT AND PLAN  65 y.o. right-handed [place] American female with multiple medical problems seen in our office for SEIZURE DISORDER. Had breakthrough sz in May 2015.   Dx:  Partial idiopathic epilepsy with seizures of localized onset, intractable, without status epilepticus (HCC) - Plan: Ambulatory referral to Neurology    PLAN:  - continue levetiracetam XR 500mg  in AM and 2000mg  at bedtime; offered to increase LEV up to 1500mg  BID, but patient concerned about side effects - offered to start vimpat, but patient concerned about  side effects - no driving due to recurrent seizure and syncope -  offered to repeat EEG; may need VEEG; however due to complexity of issues, [facility] and defer VEEG monitoring decision to their expertise  Meds ordered this encounter  Medications  . levETIRAcetam (KEPPRA XR) 500 MG 24 hr tablet    Sig: Take 500 mg by mouth in the morning and take 2000 mg by mouth at bedtime.    Dispense:  150 tablet    Refill:  12   Orders Placed This Encounter  Procedures  . Ambulatory referral to Neurology    Referral Priority:   Routine    Referral Type:   Consultation    Referral Reason:   Specialty Services Required    Requested Specialty:   Neurology    Number of Visits Requested:   1   Return in about 6 months (around 12/11/2016).    Suanne MarkerVIKRAM R. Lucius Wise, MD 06/12/2016, 12:51 PM Certified in Neurology, Neurophysiology and Neuroimaging  [facility] [address], [address] [place], [place] [address] [phone]

## 2016-06-12 NOTE — Patient Instructions (Signed)
-   I will setup second opinion evaluation for seizures  - continue current medications

## 2016-06-19 ENCOUNTER — Telehealth: Payer: Self-pay | Admitting: Internal Medicine

## 2016-06-19 NOTE — Telephone Encounter (Signed)
Please have patient make an appointment with me to be evaluated this coming week.

## 2016-06-19 NOTE — Telephone Encounter (Signed)
Scheduled patient for first available appt with PCP (06/25/16 at 1:30pm)  L. Leward Quanucatte, RN, BSN

## 2016-06-19 NOTE — Telephone Encounter (Signed)
Pt bp has been running low like 128/60, 104/57, 102/49, 91/60, 112/58, 96/64, 118/50.  When it gets low, she gets sleepy. When she gets up she feels dizzy. What should she do?

## 2016-06-22 ENCOUNTER — Ambulatory Visit
Admission: RE | Admit: 2016-06-22 | Discharge: 2016-06-22 | Disposition: A | Discharge status: Home / Self Care | Payer: Medicaid Other | Source: Ambulatory Visit | Attending: Family Medicine | Admitting: Family Medicine

## 2016-06-22 DIAGNOSIS — Z1231 Encounter for screening mammogram for malignant neoplasm of breast: Secondary | ICD-10-CM

## 2016-06-25 ENCOUNTER — Encounter: Payer: Self-pay | Admitting: Internal Medicine

## 2016-06-25 ENCOUNTER — Ambulatory Visit (INDEPENDENT_AMBULATORY_CARE_PROVIDER_SITE_OTHER): Payer: Medicaid Other | Admitting: Internal Medicine

## 2016-06-25 VITALS — BP 126/75 | HR 57 | Temp 97.9°F | Ht 66.0 in | Wt 207.0 lb

## 2016-06-25 DIAGNOSIS — I951 Orthostatic hypotension: Secondary | ICD-10-CM

## 2016-06-25 NOTE — Progress Notes (Signed)
   [name] [facility] Toni CharsAsiyah Lovene Maret, MD Phone: [phone]  Reason For Visit: SDA for Blood pressure   # Presenting with concern for low blood pressure; patient states her blood pressure drops when she goes from sitting to standing.  When patient stands up from sitting or lying position her BP drops to 96/57 and 97/64 - from blood pressures in the 120/80 and 130/70. Patient states she feels shaky and dizzy when this occurs and sometimes feels a little nauseated. Patient takes  Percocet, however feels that she needs this medication for her pain control. Her neurologist also recently increased her Keppra due to the presence of break through seizures.  No chest pain, lower extremity edema, no LOC, no headaches.   Past Medical History Reviewed problem list.  Medications- reviewed and updated No additions to family history Social history- patient is a non-smoker  Objective: BP 126/75 (BP Location: Left Arm, Patient Position: Sitting, Cuff Size: Normal)   Pulse (!) 57   Temp 97.9 F (36.6 C) (Oral)   Ht 5\' 6"  (1.676 m)   Wt 207 lb (93.9 kg)   SpO2 99%   BMI 33.41 kg/m  Gen: NAD, alert, cooperative with exam HEENT: Normal   Eyes: PERRLA, EOMI Cardio: regular rate and rhythm, S1S2 heard, no murmurs appreciated Pulm: clear to auscultation bilaterally, no wheezes, rhonchi or rales Neuro: Strength and sensation grossly intact   Assessment/Plan: See problem based a/p  Orthostatic hypotension History of orthostatic hypotension. Presenting with similar symptoms. No orthostatic hypotension at the clinic today. However, significant potential for orthostatic hypotension due to medication list. Patient was recently started on metoprolol 25 mg BID due to failure of [name] completely control A. fib. Patient also with a history of seizures and was recently having breakthrough seizures requiring an increased dose in Keppra which could contribute. Finally takes Percocet daily and  feels that this is required for her body pain, managed by her pain clinic- thought could significantly contribute to patient's orthostatics  -Discussed that there are potentially many different medications that could increase patient's potential of having orthostatic hypotension -Will follow-up with her cardiologist in the next couple weeks, patient has an appointment on the 25th, plans to discuss if any medication changes can be made - Could consider Florinef, however given patient normotensive state would be hesitant to start this. Possibly deconditioning also significantly contributing - could consider physical therapy if patient is interested.

## 2016-06-25 NOTE — Patient Instructions (Signed)
I would likely to discuss this issue with your cardiologist. You can ask them if a medication like Florinef would be appropriate for you   Orthostatic Hypotension Orthostatic hypotension is a sudden drop in blood pressure. It happens when you quickly stand up from a seated or lying position. You may feel dizzy or light-headed. This can last for just a few seconds or for up to a few minutes. It is usually not a serious problem. However, if this happens frequently or gets worse, it can be a sign of something more serious. CAUSES  Different things can cause orthostatic hypotension, including:   Loss of body fluids (dehydration).  Medicines that lower blood pressure.  Sudden changes in posture, such as standing up quickly after you have been sitting or lying down.  Taking too much of your medicine. SIGNS AND SYMPTOMS   Light-headedness or dizziness.   Fainting or near-fainting.   A fast heart rate.   Weakness.   Feeling tired (fatigue).  DIAGNOSIS  Your health care provider may do several things to help diagnose your condition and identify the cause. These may include:   Taking a medical history and doing a physical exam.  Checking your blood pressure. Your health care provider will check your blood pressure when you are:  Lying down.  Sitting.  Standing.  Using tilt table testing. In this test, you lie down on a table that moves from a lying position to a standing position. You will be strapped onto the table. This test monitors your blood pressure and heart rate when you are in different positions. TREATMENT  Treatment will vary depending on the cause. Possible treatments include:   Changing the dosage of your medicines.  Wearing compression stockings on your lower legs.  Standing up slowly after sitting or lying down.  Eating more salt.  Eating frequent, small meals.  In some cases, getting IV fluids.  Taking medicine to enhance fluid retention. HOME CARE  INSTRUCTIONS  Only take over-the-counter or prescription medicines as directed by your health care provider.  Follow your health care provider's instructions for changing the dosage of your current medicines.  Do not stop or adjust your medicine on your own.  Stand up slowly after sitting or lying down. This allows your body to adjust to the different position.  Wear compression stockings as directed.  Eat extra salt as directed.  Do not add extra salt to your diet unless directed to by your health care provider.  Eat frequent, small meals.  Avoid standing suddenly after eating.  Avoid hot showers or excessive heat as directed by your health care provider.  Keep all follow-up appointments. SEEK MEDICAL CARE IF:  You continue to feel dizzy or light-headed after standing.  You feel groggy or confused.  You feel cold, clammy, or sick to your stomach (nauseous).  You have blurred vision.  You feel short of breath. SEEK IMMEDIATE MEDICAL CARE IF:   You faint after standing.  You have chest pain.  You have difficulty breathing.   You lose feeling or movement in your arms or legs.   You have slurred speech or difficulty talking, or you are unable to talk.  MAKE SURE YOU:   Understand these instructions.  Will watch your condition.  Will get help right away if you are not doing well or get worse.   This information is not intended to replace advice given to you by your health care provider. Make sure you discuss any questions you have  with your health care provider.   Document Released: 07/24/2002 Document Revised: 08/08/2013 Document Reviewed: 05/26/2013 Elsevier Interactive Patient Education [employer]2016 [employer]vier Inc.

## 2016-06-28 NOTE — Assessment & Plan Note (Signed)
History of orthostatic hypotension. Presenting with similar symptoms. No orthostatic hypotension at the clinic today. However, significant potential for orthostatic hypotension due to medication list. Patient was recently started on metoprolol 25 mg BID due to failure of [name] completely control A. fib. Patient also with a history of seizures and was recently having breakthrough seizures requiring an increased dose in Keppra which could contribute. Finally takes Percocet daily and feels that this is required for her body pain, managed by her pain clinic- thought could significantly contribute to patient's orthostatics  -Discussed that there are potentially many different medications that could increase patient's potential of having orthostatic hypotension -Will follow-up with her cardiologist in the next couple weeks, patient has an appointment on the 25th, plans to discuss if any medication changes can be made - Could consider Florinef, however given patient normotensive state would be hesitant to start this. Possibly deconditioning also significantly contributing - could consider physical therapy if patient is interested.

## 2016-06-29 ENCOUNTER — Telehealth: Payer: Self-pay | Admitting: Diagnostic Neuroimaging

## 2016-06-29 ENCOUNTER — Telehealth: Payer: Self-pay | Admitting: Internal Medicine

## 2016-06-29 NOTE — Telephone Encounter (Signed)
Yes, please give verbal orders.

## 2016-06-29 NOTE — Telephone Encounter (Signed)
Pt has called in asking our office to send her [place] [facility] [place] Phone: [phone] . This will get her transportation to the appt.   Appt date: 07/06/16 at 1pm

## 2016-06-29 NOTE — Telephone Encounter (Signed)
[facility] know if PCP is willing to extend orders for PT for deconditioning. Please advise. Thanks! ep

## 2016-06-30 NOTE — Telephone Encounter (Signed)
Left message on [name]'s voicemail providing verbal orders to extend PT.

## 2016-06-30 NOTE — Telephone Encounter (Signed)
[name]:  In dr Darnelle Goingmikles notes on Nov 9, pt was mentioned. She is interested in PT. [name] wants to know what agency will provide this.  Please advise

## 2016-07-01 NOTE — Telephone Encounter (Signed)
Called DSS and Patient is scheduled for for pick up through  [employer] to take her [facility] [facility] [employer] will pick patient up at her home. [employer] telephone [phone]. Tried to call Patient x3 and her voice mail is not set up and she did not answer the telephone.

## 2016-07-12 NOTE — Progress Notes (Signed)
Patient ID: Toni RosenthalLinda F Adachi, female   DOB: 02/08/1951, 65 y.o.   MRN: [id number]   65 y.o. with  HTN and PAF On tikosyn. Hospitalization 5/15  with PAF converted with cardizem Xarelto started. Normal cath in 2012  Echo 5/24 normal EF and only mild LAE  Study Conclusions  - Left ventricle: The cavity size was normal. Wall thickness was increased in a pattern of mild LVH. Systolic function was normal. The estimated ejection fraction was in the range of 60% to 65%. Wall motion was normal; there were no regional wall motion abnormalities. Doppler parameters are consistent with abnormal left ventricular relaxation (grade 1 diastolic dysfunction). - Left atrium: The atrium was mildly dilated.  Doing well with no recurrent palpitations   Seizure disorder followed by neurology Last one also in 5/15 Keppra increased at that time  Still having them and [facility]   Discussed need to stay on xarelto with recurrent afib and [place] VASC score of 3    [facility] with UTI and orthostasis Meds adjusted Rx with Ceftin  Less postural symptoms No PAF recurrence    DM: lattuda not effective and stopped  08/23/15  Had URI in hospital R/O PAF converted with cardizem  Still on tikosyn   ROS: Denies fever, malais, weight loss, blurry vision, decreased visual acuity, cough, sputum, SOB, hemoptysis, pleuritic pain, palpitaitons, heartburn, abdominal pain, melena, lower extremity edema, claudication, or rash.  All other systems reviewed and negative  General: Affect appropriate Black female ambulating with walker  HEENT: normal Neck supple with no adenopathy JVP normal no bruits no thyromegaly Lungs clear with no wheezing and good diaphragmatic motion Heart:  S1/S2 no murmur, no rub, gallop or click PMI normal Abdomen: benighn, BS positve, no tenderness, no AAA no bruit.  No HSM or HJR Distal pulses intact with no bruits No edema Neuro non-focal Skin warm and dry No  muscular weakness   Current Outpatient Prescriptions  Medication Sig Dispense Refill  . albuterol (PROVENTIL HFA;VENTOLIN HFA) 108 (90 BASE) MCG/ACT inhaler Inhale 2 puffs into the lungs every 6 (six) hours as needed for wheezing or shortness of breath. 1 Inhaler 1  . atorvastatin (LIPITOR) 80 MG tablet Take 80 mg by mouth at bedtime.  0  . calcium carbonate (OS-CAL) 600 MG TABS tablet Take 600 mg by mouth daily.    . cholecalciferol (VITAMIN D) 1000 UNITS tablet Take 1,000 Units by mouth 2 (two) times daily.     . Cranberry 500 MG CAPS Take 500 mg by mouth 2 (two) times daily.    Marland Kitchen. dofetilide ([name]) 250 MCG capsule take 1 capsule by mouth twice a day (Patient taking differently: Take 250 mcg by mouth twice daily) 180 capsule 3  . DULoxetine (CYMBALTA) 60 MG capsule Take 60 mg by mouth at bedtime.     Marland Kitchen. esomeprazole (NEXIUM) 40 MG capsule Take 1 capsule (40 mg total) by mouth 2 (two) times daily before a meal. 60 capsule 6  . EVZIO 0.4 MG/0.4ML SOAJ Inject 0.4 mLs as directed daily as needed (over dose).   0  . ferrous sulfate 325 (65 FE) MG tablet Take 325 mg by mouth daily with breakfast.    . fluticasone (FLONASE) 50 MCG/ACT nasal spray Place 2 sprays into both nostrils daily. 16 g 6  . Insulin Glargine (LANTUS SOLOSTAR) 100 UNIT/ML Solostar Pen Inject 30 Units into the skin at bedtime. (Patient taking differently: Inject 40 Units into  the skin at bedtime. )    . levETIRAcetam (KEPPRA XR) 500 MG 24 hr tablet Take 500 mg by mouth in the morning and take 2000 mg by mouth at bedtime. 150 tablet 12  . loratadine (CLARITIN) 10 MG tablet take 1 tablet by mouth once daily (Patient taking differently: Take 10 mg by mouth once daily) 30 tablet 1  . Magnesium Oxide 400 (240 Mg) MG TABS take 1 tablet by mouth once daily (Patient taking differently: Take 400 mg by mouth once daily) 30 tablet 3  . metoprolol tartrate (LOPRESSOR) 25 MG tablet Take 0.5 tablets (12.5 mg total) by mouth 2 (two) times daily.  60 tablet 0  . mometasone-formoterol (DULERA) 200-5 MCG/ACT AERO Inhale 2 puffs into the lungs 2 (two) times daily.    . nitroGLYCERIN (NITROSTAT) 0.4 MG SL tablet Place 1 tablet (0.4 mg total) under the tongue every 5 (five) minutes as needed for chest pain (do nto exceed 3 doses). 25 tablet 2  . NOVOLOG FLEXPEN 100 UNIT/ML FlexPen Inject 18-22 Units as directed 2 (two) times daily. Per sliding scale  0  . ondansetron (ZOFRAN ODT) 4 MG disintegrating tablet Take 1 tablet (4 mg total) by mouth every 8 (eight) hours as needed for nausea or vomiting. 10 tablet 0  . oxyCODONE-acetaminophen (PERCOCET) 10-325 MG tablet Take 1 tablet by mouth 5 (five) times daily. (scheduled)     . Polyvinyl Alcohol-Povidone (REFRESH OP) Place 1 drop into both eyes as needed (for dry eyes).    . potassium chloride (K-DUR) 10 MEQ tablet take 1 tablet by mouth once daily 90 tablet 1  . pregabalin (LYRICA) 100 MG capsule Take 100 mg by mouth 3 (three) times daily.    . vitamin C (ASCORBIC ACID) 500 MG tablet Take 500 mg by mouth 2 (two) times daily.    . VOLTAREN 1 % GEL Apply 2 g topically 4 (four) times daily as needed. For pain  0  . XARELTO 20 MG TABS tablet TAKE 1 TABLET BY MOUTH ONCE DAILY WITH SUPPER (Patient taking differently: Take 20 mg by mouth once daily) 30 tablet 2   No current facility-administered medications for this visit.     Allergies  Prednisone; Amitriptyline; and Hydromorphone hcl  Electrocardiogram:  5/15 SR rate 61 inferolateral T wave changes  QT 444   10/30/15  SR rate 70  Nonspecific ST changes QT 414   Assessment and Plan  PAF: maint NSR no palpitations continue xarelto and tikosyn DM:  Discussed low carb diet.  Target hemoglobin A1c is 6.5 or less.  Continue current medications. Seizures:  Breakthrough on Keppra f/[facility] IP staty [facility] for [facility] does not drive  Anticoagulation  On NOAC no bleeding issues Cr stable  Chol  Labs with primary Bipolar:  F/u behavioral health better with  Abilify GERD:  F/u GI see if she can get non generic nexium          F/U with me 6 months

## 2016-07-14 ENCOUNTER — Telehealth: Payer: Self-pay | Admitting: *Deleted

## 2016-07-14 NOTE — Telephone Encounter (Signed)
[facility] form faxed to Haverhillamiko D. @ [phone].

## 2016-07-15 ENCOUNTER — Ambulatory Visit (INDEPENDENT_AMBULATORY_CARE_PROVIDER_SITE_OTHER): Payer: Medicaid Other | Admitting: Cardiovascular Disease

## 2016-07-15 ENCOUNTER — Encounter: Payer: Self-pay | Admitting: Cardiovascular Disease

## 2016-07-15 VITALS — BP 110/60 | HR 68 | Ht 66.0 in | Wt 210.8 lb

## 2016-07-15 DIAGNOSIS — I480 Paroxysmal atrial fibrillation: Secondary | ICD-10-CM

## 2016-07-15 NOTE — Patient Instructions (Signed)

## 2016-07-17 ENCOUNTER — Other Ambulatory Visit: Payer: Self-pay | Admitting: Cardiovascular Disease

## 2016-08-13 ENCOUNTER — Other Ambulatory Visit: Payer: Self-pay | Admitting: Family Medicine

## 2016-08-24 ENCOUNTER — Ambulatory Visit: Payer: Medicaid Other | Admitting: Obstetrics and Gynecology

## 2016-08-24 ENCOUNTER — Emergency Department (HOSPITAL_COMMUNITY): Payer: Medicaid Other

## 2016-08-24 ENCOUNTER — Emergency Department (HOSPITAL_COMMUNITY)
Admission: EM | Admit: 2016-08-24 | Discharge: 2016-08-24 | Disposition: A | Discharge status: Home / Self Care | Payer: Medicaid Other | Attending: Emergency Medicine | Admitting: Emergency Medicine

## 2016-08-24 ENCOUNTER — Encounter (HOSPITAL_COMMUNITY): Payer: Self-pay | Admitting: Emergency Medicine

## 2016-08-24 DIAGNOSIS — J45909 Unspecified asthma, uncomplicated: Secondary | ICD-10-CM | POA: Insufficient documentation

## 2016-08-24 DIAGNOSIS — I129 Hypertensive chronic kidney disease with stage 1 through stage 4 chronic kidney disease, or unspecified chronic kidney disease: Secondary | ICD-10-CM | POA: Diagnosis not present

## 2016-08-24 DIAGNOSIS — R569 Unspecified convulsions: Secondary | ICD-10-CM

## 2016-08-24 DIAGNOSIS — E1140 Type 2 diabetes mellitus with diabetic neuropathy, unspecified: Secondary | ICD-10-CM | POA: Diagnosis not present

## 2016-08-24 DIAGNOSIS — Z79899 Other long term (current) drug therapy: Secondary | ICD-10-CM | POA: Diagnosis not present

## 2016-08-24 DIAGNOSIS — G40909 Epilepsy, unspecified, not intractable, without status epilepticus: Secondary | ICD-10-CM | POA: Diagnosis present

## 2016-08-24 DIAGNOSIS — R55 Syncope and collapse: Secondary | ICD-10-CM | POA: Diagnosis not present

## 2016-08-24 DIAGNOSIS — Z794 Long term (current) use of insulin: Secondary | ICD-10-CM | POA: Insufficient documentation

## 2016-08-24 DIAGNOSIS — N183 Chronic kidney disease, stage 3 (moderate): Secondary | ICD-10-CM | POA: Insufficient documentation

## 2016-08-24 DIAGNOSIS — E1122 Type 2 diabetes mellitus with diabetic chronic kidney disease: Secondary | ICD-10-CM | POA: Insufficient documentation

## 2016-08-24 DIAGNOSIS — I951 Orthostatic hypotension: Secondary | ICD-10-CM

## 2016-08-24 DIAGNOSIS — E860 Dehydration: Secondary | ICD-10-CM | POA: Diagnosis not present

## 2016-08-24 LAB — CBC WITH DIFFERENTIAL/PLATELET
BASOS PCT: 0 %
Basophils Absolute: 0 10*3/uL (ref 0.0–0.1)
EOS ABS: 0 10*3/uL (ref 0.0–0.7)
EOS PCT: 0 %
HCT: 38.7 % (ref 36.0–46.0)
Hemoglobin: 12.9 g/dL (ref 12.0–15.0)
LYMPHS ABS: 1.9 10*3/uL (ref 0.7–4.0)
Lymphocytes Relative: 24 %
MCH: 29.7 pg (ref 26.0–34.0)
MCHC: 33.3 g/dL (ref 30.0–36.0)
MCV: 89 fL (ref 78.0–100.0)
Monocytes Absolute: 0.5 10*3/uL (ref 0.1–1.0)
Monocytes Relative: 7 %
Neutro Abs: 5.3 10*3/uL (ref 1.7–7.7)
Neutrophils Relative %: 69 %
PLATELETS: 175 10*3/uL (ref 150–400)
RBC: 4.35 MIL/uL (ref 3.87–5.11)
RDW: 13.3 % (ref 11.5–15.5)
WBC: 7.8 10*3/uL (ref 4.0–10.5)

## 2016-08-24 LAB — URINALYSIS, ROUTINE W REFLEX MICROSCOPIC
GLUCOSE, UA: 150 mg/dL — AB
HGB URINE DIPSTICK: NEGATIVE
KETONES UR: 5 mg/dL — AB
NITRITE: NEGATIVE
PROTEIN: 100 mg/dL — AB
Specific Gravity, Urine: 1.029 (ref 1.005–1.030)
pH: 5 (ref 5.0–8.0)

## 2016-08-24 LAB — COMPREHENSIVE METABOLIC PANEL
ALBUMIN: 4.1 g/dL (ref 3.5–5.0)
ALT: 35 U/L (ref 14–54)
ANION GAP: 11 (ref 5–15)
AST: 36 U/L (ref 15–41)
Alkaline Phosphatase: 89 U/L (ref 38–126)
BUN: 16 mg/dL (ref 6–20)
CHLORIDE: 105 mmol/L (ref 101–111)
CO2: 26 mmol/L (ref 22–32)
Calcium: 9.2 mg/dL (ref 8.9–10.3)
Creatinine, Ser: 1.73 mg/dL — ABNORMAL HIGH (ref 0.44–1.00)
GFR calc non Af Amer: 30 mL/min — ABNORMAL LOW (ref >60)
GFR, EST AFRICAN AMERICAN: 35 mL/min — AB (ref >60)
GLUCOSE: 238 mg/dL — AB (ref 65–99)
Potassium: 4.1 mmol/L (ref 3.5–5.1)
SODIUM: 142 mmol/L (ref 135–145)
Total Bilirubin: 0.9 mg/dL (ref 0.3–1.2)
Total Protein: 7.8 g/dL (ref 6.5–8.1)

## 2016-08-24 LAB — CBG MONITORING, ED: Glucose-Capillary: 239 mg/dL — ABNORMAL HIGH (ref 65–99)

## 2016-08-24 MED ORDER — SODIUM CHLORIDE 0.9 % IV BOLUS (SEPSIS)
1000.0000 mL | Freq: Once | INTRAVENOUS | Status: AC
  Administered 2016-08-24: 1000 mL via INTRAVENOUS

## 2016-08-24 MED ORDER — FENTANYL CITRATE (PF) 100 MCG/2ML IJ SOLN
50.0000 ug | Freq: Once | INTRAMUSCULAR | Status: AC
  Administered 2016-08-24: 50 ug via INTRAVENOUS
  Filled 2016-08-24: qty 2

## 2016-08-24 MED ORDER — ONDANSETRON HCL 4 MG/2ML IJ SOLN
4.0000 mg | Freq: Once | INTRAMUSCULAR | Status: AC
  Administered 2016-08-24: 4 mg via INTRAVENOUS
  Filled 2016-08-24: qty 2

## 2016-08-24 MED ORDER — LEVETIRACETAM 500 MG PO TABS
1000.0000 mg | ORAL_TABLET | Freq: Once | ORAL | Status: AC
  Administered 2016-08-24: 1000 mg via ORAL
  Filled 2016-08-24: qty 2

## 2016-08-24 MED ORDER — ACETAMINOPHEN 500 MG PO TABS
1000.0000 mg | ORAL_TABLET | Freq: Once | ORAL | Status: AC
  Administered 2016-08-24: 1000 mg via ORAL
  Filled 2016-08-24: qty 2

## 2016-08-24 NOTE — ED Triage Notes (Signed)
Per EMS. Pt from home. Pt reports syncopal episode today while bringing in groceries followed by seizure like activity according to friend. Hx of seizures, takes keppra. Has also had n/v/d for the past 3 days. Has also had syncopal episodes at home prior to today that she is going to follow up at [facility] for.

## 2016-08-24 NOTE — ED Provider Notes (Signed)
[facility]-EMERGENCY DEPT Provider Note   CSN: [id number] Arrival date & time: 08/24/16  1442     History   Chief Complaint Chief Complaint  Patient presents with  . Loss of Consciousness  . Seizures    HPI [name] is a 66 y.o. female w/ pmh of seizure disorder, syncope, bipolar, chronic pain, fibromyalgia, T2DM,  Who presents for syncope and seizure. [facility]. The patient states that she has had abdominal pain, nausea and vomiting along with diarrhea for the past 3 days. She states she's had 3-4 tablets of watery brown stool daily and has been unable to hold down her usual. Due to the for cast of inclement weather today. The patient states that her aunt took her to the grocery store to get groceries. When she got back to the house. She states she had an episode of passing out and spit on her groceries. She was able to pick him up and put them away. However, when she walked back outside, she passed out again and then states that she had a seizure in front of her aunt. She does not remember these events. She complains of some pain in her lower back and her left shoulder. She denies any trauma to her head. She states that she has been passing out fairly regularly. She also states that she is scheduled for an EEG at [facility] on January 23. She has generalized crampy abdominal pain. She denies fevers, chills. Last episode of diarrhea and vomiting was early this morning.   HPI  Past Medical History:  Diagnosis Date  . Anxiety   . Arthritis    "knees, hands, ankles, feet" (05/12/2016)  . Asthma    Dr. Sherene SiresWert  . Atrial fibrillation (HCC)   . Bipolar disorder (HCC)   . Chronic back pain    "lower and middle" (05/12/2016)  . CKD (chronic kidney disease), stage III   . Colon polyps   . Depression   . Diverticulosis   . Epilepsy (HCC)   . Essential hypertension   . Fibromyalgia   . Gallstones   . GERD  (gastroesophageal reflux disease)   . H/O hiatal hernia   . Hyperlipidemia   . IBS (irritable bowel syndrome)   . Kidney stones    "passed them all" (05/12/2016)  . Migraine    05/12/2016 "daily since Sunday; they had calmed to once q 6 months or so"  . Paroxysmal a-fib (HCC)    a. On [name]. b. Recurrence 08/2014 in setting of GI illness.  . Peripheral neuropathy (HCC)   . Sarcoidosis of lung (HCC)    Dr. Sherene SiresWert  . Seizures (HCC)    "epileptic; pretty regular recently" (05/12/2016)  . Type II diabetes mellitus [facility](HCC)     Patient Active Problem List   Diagnosis Date Noted  . Right sided weakness 05/19/2016  . Persistent atrial fibrillation (HCC)   . Seizure (HCC) 05/12/2016  . Facial numbness   . Irritable bowel syndrome 03/03/2016  . Abdominal pain 01/16/2016  . Upper respiratory infection 10/14/2015  . Rhinitis, chronic 10/10/2015  . Chronic leukopenia 09/13/2015  . PAF (paroxysmal atrial fibrillation) (HCC) 08/22/2015  . Breast nodule 07/26/2015  . Morbid obesity (HCC) 07/11/2015  . Bipolar I disorder (HCC) 05/22/2015  . Chronic kidney disease (CKD), stage III (moderate) 05/14/2015  . Chronic hip pain 05/14/2015  . Seizure disorder (HCC)   . History of diverticulitis   . Syncope  06/29/2012  . Orthostatic hypotension 06/29/2012  . Personal history of colonic polyps 09/01/2011  . Sarcoid (HCC) 09/01/2011  . Cough variant asthma 09/01/2011  . GERD (gastroesophageal reflux disease) 09/01/2011  . Obesity 09/01/2011  . 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 CHEST PAIN 09/26/2010  . Diabetes mellitus type 2, uncontrolled, with complications (HCC) 11/19/2008  . Hyperlipidemia 11/19/2008  . Essential hypertension 11/19/2008  . Cough 11/19/2008    Past Surgical History:  Procedure Laterality Date  . ABDOMINAL HYSTERECTOMY  1995  . ANTERIOR CERVICAL DECOMP/DISCECTOMY FUSION  07/11/2012   Procedure: ANTERIOR CERVICAL DECOMPRESSION/DISCECTOMY FUSION 1 LEVEL;  Surgeon: [name], MD;  Location: [facility] NEURO [facility];  Service: Neurosurgery;  Laterality: N/A;  Cervical Five-Six Anterior Cervical Decompression with Fusion Interbody Prothesis Plating and Bonegraft  . CARDIAC CATHETERIZATION  2012   a. Normal coronaries 2012. b. Normal nuc 09/2014.  Marland Kitchen CHOLECYSTECTOMY OPEN  1976  . CLOSED REDUCTION ANKLE FRACTURE Left 10/2006   "got steel rod in my leg; and screws"  . COLON SURGERY    . DILATION AND CURETTAGE OF UTERUS    . ESOPHAGEAL MANOMETRY  03/21/2012   Procedure: ESOPHAGEAL MANOMETRY (EM);  Surgeon: [name], MD;  Location: [facility] ENDOSCOPY;  Service: Endoscopy;  Laterality: N/A;  . FRACTURE SURGERY    . NEUROPLASTY / TRANSPOSITION MEDIAN NERVE AT CARPAL TUNNEL Left 2004  . NEUROPLASTY / TRANSPOSITION MEDIAN NERVE AT CARPAL TUNNEL Right 2002  . 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 SALPINGOOPHORECTOMY Bilateral 2000  . TUBAL LIGATION  1982    OB History    No data available       Home Medications    Prior to Admission medications   Medication Sig Start Date End Date Taking? Authorizing Provider  albuterol (PROVENTIL HFA;VENTOLIN HFA) 108 (90 BASE) MCG/ACT inhaler Inhale 2 puffs into the lungs every 6 (six) hours as needed for wheezing or shortness of breath. 05/27/15  Yes Asiyah Mayra Reel, MD  atorvastatin (LIPITOR) 80 MG tablet Take 80 mg by mouth at bedtime. 05/29/16  Yes Historical Provider, MD  calcium carbonate (OS-CAL) 600 MG TABS tablet Take 600 mg by mouth daily.   Yes Historical Provider, MD  cholecalciferol (VITAMIN D) 1000 UNITS tablet Take 1,000 Units by mouth 2 (two) times daily.    Yes Historical Provider, MD  Cranberry 500 MG CAPS Take 500 mg by mouth 2 (two) times daily.   Yes Historical Provider, MD  dofetilide ([name]) 250 MCG capsule take 1 capsule by mouth twice a day Patient taking differently: Take 250 mcg by mouth twice daily 11/27/15  Yes Wendall Stade, MD  DULoxetine (CYMBALTA) 60 MG capsule Take 60 mg by mouth at bedtime.     Yes Historical Provider, MD  esomeprazole (NEXIUM) 40 MG capsule Take 1 capsule (40 mg total) by mouth 2 (two) times daily before a meal. 12/17/15  Yes [name], MD  EVZIO 0.4 MG/0.4ML SOAJ Inject 0.4 mLs as directed daily as needed (over dose).  07/10/15  Yes Historical Provider, MD  ferrous sulfate 325 (65 FE) MG tablet Take 325 mg by mouth daily with breakfast.   Yes Historical Provider, MD  fluticasone (FLONASE) 50 MCG/ACT nasal spray Place 2 sprays into both nostrils daily. 10/14/15  Yes Asiyah Mayra Reel, MD  Insulin Glargine (LANTUS SOLOSTAR) 100 UNIT/ML Solostar Pen Inject 30 Units into the skin at bedtime. Patient taking differently: Inject 40 Units into the skin at bedtime.  08/23/15  Yes Laurann Montana, PA-C  levETIRAcetam (KEPPRA XR) 500 MG 24 hr tablet Take 500 mg by mouth in the morning and take 2000 mg by mouth at bedtime. 06/12/16  Yes Suanne Marker, MD  loratadine (CLARITIN) 10 MG tablet take 1 tablet by mouth once daily Patient taking differently: Take 10 mg by mouth once daily 04/25/16  Yes Asiyah Mayra Reel, MD  Magnesium Oxide 400 (240 Mg) MG TABS take 1 tablet by mouth once daily Patient taking differently: Take 400 mg by mouth once daily 03/12/16  Yes Dayna N Dunn, PA-C  metoprolol tartrate (LOPRESSOR) 25 MG tablet take 1/2 tablet by mouth twice a day Patient taking differently: TAKE 12.5 MG BY MOUTH TWICE DAILY 08/19/16  Yes Asiyah Mayra Reel, MD  mometasone-formoterol (DULERA) 200-5 MCG/ACT AERO Inhale 2 puffs into the lungs 2 (two) times daily.   Yes Historical Provider, MD  nitroGLYCERIN (NITROSTAT) 0.4 MG SL tablet Place 1 tablet (0.4 mg total) under the tongue every 5 (five) minutes as needed for chest pain (do nto exceed 3 doses). 09/12/15  Yes Brittainy M Simmons, PA-C  NOVOLOG FLEXPEN 100 UNIT/ML FlexPen Inject 18-22 Units as directed 2 (two) times daily. Per sliding scale 03/29/15  Yes Historical Provider, MD  ondansetron (ZOFRAN ODT) 4 MG disintegrating tablet  Take 1 tablet (4 mg total) by mouth every 8 (eight) hours as needed for nausea or vomiting. 04/20/16  Yes Renne Crigler, PA-C  oxyCODONE-acetaminophen (PERCOCET) 10-325 MG tablet Take 1 tablet by mouth 5 (five) times daily. (scheduled)    Yes Historical Provider, MD  Polyvinyl Alcohol-Povidone (REFRESH OP) Place 1 drop into both eyes as needed (for dry eyes).   Yes Historical Provider, MD  potassium chloride (K-DUR) 10 MEQ tablet take 1 tablet by mouth once daily Patient taking differently: take 10 meq by mouth once daily 05/20/16  Yes Wendall Stade, MD  pregabalin (LYRICA) 100 MG capsule Take 100 mg by mouth 3 (three) times daily.   Yes Historical Provider, MD  vitamin C (ASCORBIC ACID) 500 MG tablet Take 500 mg by mouth 2 (two) times daily.   Yes Historical Provider, MD  VOLTAREN 1 % GEL Apply 2 g topically 4 (four) times daily as needed (pain).  08/13/14  Yes Historical Provider, MD  XARELTO 20 MG TABS tablet take 1 tablet by mouth once daily with SUPPER Patient taking differently: TAKE 20 MG BY MOUTH DAILY WITH SUPPER 07/17/16  Yes Wendall Stade, MD    Family History Family History  Problem Relation Age of Onset  . Heart attack Mother     @ age 39  . Mental illness Mother   . Diabetes Mother   . Hypertension Mother     siblings  . Alzheimer's disease Mother   . Depression Mother   . Hyperlipidemia Mother   . Heart attack Brother 50  . Alcohol abuse Brother   . Depression Brother   . Diabetes Brother   . Hyperlipidemia Brother   . Hypertension Brother   . Kidney disease Brother   . Drug abuse Brother   . Alcohol abuse Father   . Heart attack Father   . Hyperlipidemia Father   . Hypertension Father   . Colon cancer Maternal Aunt   . Prostate cancer Maternal Grandfather   . Diabetes Maternal Grandfather   . Hyperlipidemia Maternal Grandfather   . Ovarian cancer Maternal Aunt   . Diabetes    . Hypertension    . Lupus Sister   . Alcohol abuse Sister   .  Depression Sister     . Diabetes Sister   . Hyperlipidemia Sister   . Hypertension Sister   . Kidney disease Sister   . Drug abuse Sister   . Ovarian cancer Cousin   . Diabetes Maternal Grandmother   . Hyperlipidemia Maternal Grandmother     Social History Social History  Substance Use Topics  . Smoking status: Never Smoker  . Smokeless tobacco: Never Used  . Alcohol use No     Allergies   Prednisone; Amitriptyline; and Hydromorphone hcl   Review of Systems Review of Systems Ten systems reviewed and are negative for acute change, except as noted in the HPI.    Physical Exam Updated Vital Signs BP 110/59   Pulse 78   Temp 97.7 F (36.5 C) (Oral)   Resp 16   Ht 5\' 6"  (1.676 m)   Wt 90.7 kg   SpO2 98%   BMI 32.28 kg/m   Physical Exam  Constitutional: She is oriented to person, place, and time. She appears well-developed and well-nourished. No distress.  HENT:  Head: Normocephalic and atraumatic.  Eyes: Conjunctivae and EOM are normal. Pupils are equal, round, and reactive to light. No scleral icterus.  Neck: Normal range of motion.  Cardiovascular: Normal rate, regular rhythm and normal heart sounds.  Exam reveals no gallop and no friction rub.   No murmur heard. Pulmonary/Chest: Effort normal and breath sounds normal. No respiratory distress.  Abdominal: Soft. Bowel sounds are normal. She exhibits no distension and no mass. There is no tenderness. There is no guarding.  Musculoskeletal:  No midline tenderness. Tender over the R flank. No bruising Left shoulder with FROM. No bruising or deformities.  Neurological: She is alert and oriented to person, place, and time.  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 Capillary refill takes less than 2 seconds. She is not diaphoretic.     ED Treatments / Results  Labs (all labs ordered are listed, but only abnormal results are displayed) Labs Reviewed  COMPREHENSIVE METABOLIC  PANEL - Abnormal; Notable for the following:       Result Value   Glucose, Bld 238 (*)    Creatinine, Ser 1.73 (*)    GFR calc non Af Amer 30 (*)    GFR calc Af Amer 35 (*)    All other components within normal limits  URINALYSIS, ROUTINE W REFLEX MICROSCOPIC - Abnormal; Notable for the following:    Color, Urine AMBER (*)    APPearance CLOUDY (*)    Glucose, UA 150 (*)    Bilirubin Urine SMALL (*)    Ketones, ur 5 (*)    Protein, ur 100 (*)    Leukocytes, UA TRACE (*)    Bacteria, UA RARE (*)    Squamous Epithelial / LPF 6-30 (*)    All other components within normal limits  CBG MONITORING, ED - Abnormal; Notable for the following:    Glucose-Capillary 239 (*)    All other components within normal limits  CBC WITH DIFFERENTIAL/PLATELET  POCT CBG (FASTING - GLUCOSE)-MANUAL ENTRY    EKG  EKG Interpretation None       Radiology No results found.  Procedures Procedures (including critical care time)  Medications Ordered in ED Medications - No data to display   Initial Impression / Assessment and Plan / ED Course  I have reviewed the triage vital signs and the nursing notes.  Pertinent labs & imaging results that were  available during my care of the patient were reviewed by me and considered in my medical decision making (see chart for details).  Clinical Course     Patient with dehydration and Orthostasis. She has op work up this month given fluid rehydration . Keppra loading dose. Her imaging is negative. F/u with pcp. Appears safe for dc  Final Clinical Impressions(s) / ED Diagnoses   Final diagnoses:  Seizures (HCC)  Syncope due to orthostatic hypotension  Dehydration    New Prescriptions New Prescriptions   No medications on file     Arthor Captain, PA-C 08/26/16 0040    Alvira Monday, MD 08/26/16 1249

## 2016-08-24 NOTE — ED Notes (Signed)
Unable to draw blood from IV obtained. Informed phlebotomy that blood needs to obtained.

## 2016-08-24 NOTE — ED Notes (Signed)
Called main lab, phlebotomy for assistance for blood draw.

## 2016-08-24 NOTE — Discharge Instructions (Signed)
Get help right away if: °You have a seizure: °That lasts longer than 5 minutes. °That is different than previous seizures. °That leaves you unable to speak or use a part of your body. °That makes it harder to breathe. °After a head injury. °You have: °Multiple seizures in a row. °Confusion or a severe headache right after a seizure. °You are having seizures more often. °You do not wake up immediately after a seizure. °You injure yourself during a seizure. °

## 2016-08-24 NOTE — ED Notes (Signed)
Bed: WA10 Expected date:  Expected time:  Means of arrival:  Comments: EMS 

## 2016-08-25 ENCOUNTER — Ambulatory Visit (INDEPENDENT_AMBULATORY_CARE_PROVIDER_SITE_OTHER): Payer: Medicaid Other | Admitting: Internal Medicine

## 2016-08-25 ENCOUNTER — Ambulatory Visit (HOSPITAL_COMMUNITY)
Admission: RE | Admit: 2016-08-25 | Discharge: 2016-08-25 | Disposition: A | Discharge status: Home / Self Care | Payer: Medicaid Other | Source: Ambulatory Visit | Attending: Family Medicine | Admitting: Family Medicine

## 2016-08-25 ENCOUNTER — Telehealth: Payer: Self-pay | Admitting: Student

## 2016-08-25 ENCOUNTER — Other Ambulatory Visit: Payer: Self-pay | Admitting: Internal Medicine

## 2016-08-25 VITALS — BP 88/50 | HR 83 | Temp 98.3°F | Ht 66.0 in | Wt 200.0 lb

## 2016-08-25 DIAGNOSIS — I951 Orthostatic hypotension: Secondary | ICD-10-CM | POA: Diagnosis not present

## 2016-08-25 DIAGNOSIS — I959 Hypotension, unspecified: Secondary | ICD-10-CM | POA: Insufficient documentation

## 2016-08-25 DIAGNOSIS — I499 Cardiac arrhythmia, unspecified: Secondary | ICD-10-CM | POA: Diagnosis not present

## 2016-08-25 DIAGNOSIS — I4581 Long QT syndrome: Secondary | ICD-10-CM | POA: Insufficient documentation

## 2016-08-25 MED ORDER — MIDODRINE HCL 5 MG PO TABS: 5.0000 mg | ORAL_TABLET | Freq: Three times a day (TID) | ORAL | 0 refills | Status: DC

## 2016-08-25 NOTE — Telephone Encounter (Signed)
Called patient's in response to the patient received about 8:15 AM. Patient reports BP of 81/48 and heart rate of 95 when she woke up this morning. He also reports chest pain, shortness of breath and fatigue. She rechecked her blood pressure that has came up to 117/51. She was [facility] yesterday for abdominal pain, nausea and vomiting. ECG done at [facility] significant for sinus rhythm with premature as Contractions. Her labs were remarkable for AKI.  I suggested calling the clinic for SDA. However, if she has worsening of chest pain and dyspnea, I recommended calling 911. Patient voiced understanding and agreed to do as advised.

## 2016-08-25 NOTE — Assessment & Plan Note (Signed)
Pt has a long history of syncope, orthostatic hypotension, and seizures. She is followed by both Cardiology and Neurology. She is on a lot of medications that may be contributing- [name], Metoprolol tartrate, Percocet. Her Metoprolol dose was recently decreased from 25 bid to 12.5 bid. She has been referred to [facility] and has a repeat EEG scheduled for 1/23. In clinic today, she was initially drowsy and was stuttering, but she was easily arousable and her speech returned to normal when she woke up. Neuro exam was completely normal. Pt states that she usually takes Keppra 1 tablet in the morning and 4 tablets at night. She took 2 tablets in the ED and was then told to take 4 tablets in the morning. She states that she gets very tired whenever they change her Keppra dose, so I think this is what was likely causing the sleepiness in clinic. - Orthostatic vitals performed in clinic today were positive- 110/60 with sitting and 80/60 with standing - EKG performed in clinic with NSR and did not show any signs of ischemia/infarct - Will start Midodrine 5mg  tid to see if this will help her orthostatic hypotension - May need to consider stopping her Metoprolol tartrate, although she has a history of pAF and she could potentially go into A-fib with RVR - Will have her follow-up closely in the next few days for repeat orthostatic vitals. - Precepted with Dr. Perley JainMcDiarmid

## 2016-08-25 NOTE — Patient Instructions (Addendum)
It was so nice to meet you!  We think your drowsiness is caused by you taking more Keppra than normal last night. Please go back to your usual dose of 1 tablet in the morning and 4 tablets in the evening.  We are also going to start a medication called Midodrine to help increase your blood pressure. You should take this three times a day. Please come back to the clinic in 2-3 days so that we can recheck your blood pressure.   -Dr. Nancy MarusMayo

## 2016-08-25 NOTE — Progress Notes (Signed)
[name] Phone: [phone]  Subjective:  [name] is a 66 year old female presenting to clinic for ED follow-up. She was seen in the ED on 08/24/16 for syncope and seizures. She had apparently passed out at home and then had a seizure in front of her aunt. She has been having some nausea, vomiting, and diarrhea over the last few days. The ED provider has an incomplete note so it is unclear what their assessment and plan were, but Pt states she was given 2 tablets of Keppra in the ED and then told to take an additional 4 tablets of Keppra when she got home. She states she has been very tired and drowsy since doing this. She has had syncope and seizures for years and is followed by Neurology and Cardiology. She last saw the Neurologist on 10/27. Per chart review, Pt did not want to increased her Keppra to 1500mg  bid or start Vimpat because she was worried about side effects. She was referred to epilepsy clinic at [facility] and has been scheduled for a repeat EEG at [facility] on 1/23. She was last seen by Cardiology on 11/29, who recommended that she continue Xarelto and [name].   Pt endorses fatigue and feeling lightheaded when she first stands up. Her nausea, vomiting, and diarrhea have improved. She denies chest pain or shortness of breath.  ROS: See HPI for pertinent positives and negatives  Past Medical History- pAfib, seizure disorder  Family history reviewed for today's visit. No changes.  Social history- patient is a never smoker  Objective: BP (!) 88/50   Pulse 83   Temp 98.3 F (36.8 C) (Oral)   Ht 5\' 6"  (1.676 m)   Wt 200 lb (90.7 kg)   SpO2 93%   BMI 32.28 kg/m  Gen: Sleepy but easily arousable; alert; answer questions HEENT: NCAT, EOMI, MMM Neck: FROM, supple CV: RRR, no murmur Resp: Diminished lung sounds in the bases bilaterally, but lungs otherwise clear, normal work of breathing Msk: No edema, warm, normal tone, moves UE/LE spontaneously Neuro: Alert and  oriented, no gross deficits, CN 2-12 intact, 5/5 muscle strength in upper and lower extremities bilaterally, sensation intact throughout, reflexes normal  Assessment/Plan: Orthostatic Hypotension: Pt has a long history of syncope, orthostatic hypotension, and seizures. She is followed by both Cardiology and Neurology. She is on a lot of medications that may be contributing- [name], Metoprolol tartrate, Percocet. Her Metoprolol dose was recently decreased from 25 bid to 12.5 bid. She has been referred to Epilepsy Clinic at [facility] and has a repeat EEG scheduled for 1/23. In clinic today, she was initially drowsy and was stuttering, but she was easily arousable and her speech returned to normal when she woke up. Neuro exam was completely normal. Pt states that she usually takes Keppra 1 tablet in the morning and 4 tablets at night. She took 2 tablets in the ED and was then told to take 4 tablets in the morning. She states that she gets very tired whenever they change her Keppra dose, so I think this is what was likely causing the sleepiness in clinic. - Orthostatic vitals performed in clinic today were positive- 110/60 with sitting and 80/60 with standing - EKG performed in clinic with NSR and did not show any signs of ischemia/infarct - Will start Midodrine 5mg  tid to see if this will help her orthostatic hypotension - May need to consider stopping her Metoprolol tartrate, although she has a history of pAF and she could  potentially go into A-fib with RVR - Will have her follow-up closely in the next few days for repeat orthostatic vitals. - Precepted with Dr. Perley JainMcDiarmid   Willadean CarolKaty Jeffey Janssen, MD PGY-2

## 2016-08-28 ENCOUNTER — Telehealth: Payer: Self-pay | Admitting: Internal Medicine

## 2016-08-28 ENCOUNTER — Ambulatory Visit (INDEPENDENT_AMBULATORY_CARE_PROVIDER_SITE_OTHER): Payer: Medicaid Other | Admitting: Family Medicine

## 2016-08-28 VITALS — Temp 98.1°F | Wt 217.0 lb

## 2016-08-28 DIAGNOSIS — I951 Orthostatic hypotension: Secondary | ICD-10-CM | POA: Diagnosis present

## 2016-08-28 MED ORDER — ONDANSETRON 4 MG PO TBDP: 4.0000 mg | ORAL_TABLET | Freq: Three times a day (TID) | ORAL | 0 refills | Status: DC | PRN

## 2016-08-28 MED ORDER — MIDODRINE HCL 5 MG PO TABS: ORAL_TABLET | ORAL | 0 refills | Status: DC

## 2016-08-28 NOTE — Progress Notes (Signed)
Subjective: CC: f/u orthostatic hypotension HPI: Patient is a 66 y.o. female with a past medical history of syncope, atrial fibrillation, orthostatic hypotension, hypertension, seizure disorder, CKD 3 among other things presenting to clinic today for a follow-up on orthostatic hypotension.  The patient was seen for hospital follow-up secondary to syncope and seizure was noted to have orthostatic hypotension at her hospital follow-up on 08/25/16. She was started on midodrine 5mg  TID (she started on Monday night). Her last dose was this AM around 8:30.  She still feels light headed when she stands up. Her energy level is poor. She denies falls due to this as she uses a cane and states that she makes sure to get up slowly.     She's still doing Keppra 1 tablet in the AM (500mg ) and 4 tablets (2000mg ) at night. She's going to [facility] on 1/23 for an EEG. She's still pretty tired from this but she notes it is improving since we saw her last. She believes she's still having frequent seizures (which is her baseline) but she can't tell me when the last one was.  Her grandson lives with her.   She was having issues with nausea and diarrhea. No longer vomiting but has decreased PO intake due to nausea. She had some Zofran but states she ran out some time ago.   Social History: lives with her grandson, uses medical transportation   Flu Vaccine: up to date    ROS: All other systems reviewed and are negative.  Past Medical History Patient Active Problem List   Diagnosis Date Noted  . Right sided weakness 05/19/2016  . Persistent atrial fibrillation (HCC)   . Seizure (HCC) 05/12/2016  . Facial numbness   . Irritable bowel syndrome 03/03/2016  . Abdominal pain 01/16/2016  . Upper respiratory infection 10/14/2015  . Rhinitis, chronic 10/10/2015  . Chronic leukopenia 09/13/2015  . PAF (paroxysmal atrial fibrillation) (HCC) 08/22/2015  . Breast nodule 07/26/2015  . Morbid obesity (HCC) 07/11/2015  .  Bipolar I disorder (HCC) 05/22/2015  . Chronic kidney disease (CKD), stage III (moderate) 05/14/2015  . Chronic hip pain 05/14/2015  . Seizure disorder (HCC)   . History of diverticulitis   . Syncope 06/29/2012  . Orthostatic hypotension 06/29/2012  . Personal history of colonic polyps 09/01/2011  . Sarcoid (HCC) 09/01/2011  . Cough variant asthma 09/01/2011  . GERD (gastroesophageal reflux disease) 09/01/2011  . Obesity 09/01/2011  . 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 CHEST PAIN 09/26/2010  . Diabetes mellitus type 2, uncontrolled, with complications (HCC) 11/19/2008  . Hyperlipidemia 11/19/2008  . Essential hypertension 11/19/2008  . Cough 11/19/2008    Medications- reviewed and updated  Objective: Office vital signs reviewed. Temp 98.1 F (36.7 C) (Oral)   Wt 217 lb (98.4 kg)   BMI 35.02 kg/m    No data found.  Physical Examination:  General: Awake, alert, well- nourished, NAD. Appears fatigued, sometimes slow answering questions but answering questions appropriately,  makes good eye contact, is not falling asleep, and speech is clear  ENMT:  TMs intact, normal light reflex, no erythema, no bulging. Nasal turbinates moist. MMM, Oropharynx clear without erythema or tonsillar exudate/hypertrophy Eyes: Conjunctiva non-injected. PERRL.  Cardio: RRR, no m/r/g noted. No thrill noted.  Pulm: No increased WOB.  CTAB, without wheezes, rhonchi or crackles noted.  Neuro: Strength and sensation grossly intact, DTRs 2/4  Assessment/Plan: Orthostatic hypotension Patient continues to endorse symptoms of orthostatic hypotension with confirmatory orthostatic vitals in clinic. Unfortunately, we are in a  difficult position given her lying BP is now elevated to 158/90. Would like to try to improve symptoms without worsening supine hypertension. Patient with improvement in N/V, however still not with great PO intake which could also be contributing to her symptoms.  Discussed case with Dr. Randolm IdolFletke,  attending MD.  - will increase to midodrine 10mg  with breakfast and continue 5mg  with lunch and dinner.  - echo ordered. - pt advised to f/u with cardiologist.  - could consider discontinuation of metoprolol all together (HR in the 70s) however run the risk of Afib with RVR. - Zofran PRN ordered, noted this could lead to some drowsiness as well. Discussed improvement in hydration. - discussed importance of lifestyle changes to help prevent falls.  -advised to f/u with PCP early next week. - return precautions discussed.    Orders Placed This Encounter  Procedures  . ECHOCARDIOGRAM COMPLETE    Orthostatic hypotension;  H/o Afib    Standing Status:   Future    Standing Expiration Date:   11/26/2017    Order Specific Question:   Where should this test be performed    Answer:   [facility]([address]    Order Specific Question:   Does the patient weigh less than or greater than 250 lbs?    Answer:   Patient weighs less than 250 lbs    Order Specific Question:   Complete or Limited study?    Answer:   Complete    Order Specific Question:   Does the patient have a known history of hypersensitivity to Perflutren (aka Hospital doctorDefinity-Image Enhancing Agent for echocardiograms - CHECK ALLERGIES)    Answer:   No    Order Specific Question:   ADMINISTER PERFLUTERN    Answer:   ADMINISTER PERFLUTREN    Order Specific Question:   Expected Date:    Answer:   1 week    Order Specific Question:   Reason for exam-Echo    Answer:   Syncope  780.2 / R55    Order Specific Question:   Reason for exam-Echo    Answer:   Other - See Comments Section    Meds ordered this encounter  Medications  . ondansetron (ZOFRAN ODT) 4 MG disintegrating tablet    Sig: Take 1 tablet (4 mg total) by mouth every 8 (eight) hours as needed for nausea or vomiting.    Dispense:  10 tablet    Refill:  0  . midodrine (PROAMATINE) 5 MG tablet    Sig: Take 10mg  (2 tablets) with breakfast and 5mg  (1 tablet) with lunch  and dinner.    Dispense:  120 tablet    Refill:  0    Joanna Puffrystal S. Averyanna Sax PGY-3, [facility]

## 2016-08-28 NOTE — Patient Instructions (Addendum)
Increase your morning time dose of midodrine to 10mg  (2 tablets) first thing in the morning and 5mg  (1 tablet) with both lunch and dinner.  I have ordered an echocardiogram.  You need to follow up with your cardiologist given your continued symptoms.  Continue to try to get up slowly and make sure you have sturdy things to hold on to when changing positions.  Make sure you're trying to eat and drink regularly. I have prescribed Zofran, however this can make you a little sleepy as well.   Orthostatic Hypotension Orthostatic hypotension is a sudden drop in blood pressure that happens when you quickly change positions, such as when you get up from a seated or lying position. Blood pressure is a measurement of how strongly, or weakly, your blood is pressing against the walls of your arteries. Arteries are blood vessels that carry blood from your heart throughout your body. When blood pressure is too low, you may not get enough blood to your brain or to the rest of your organs. This can cause weakness, light-headedness, rapid heartbeat, and fainting. This can last for just a few seconds or for up to a few minutes. Orthostatic hypotension is usually not a serious problem. However, if it happens frequently or gets worse, it may be a sign of something more serious. What are the causes? This condition may be caused by:  Sudden changes in posture, such as standing up quickly after you have been sitting or lying down.  Blood loss.  Loss of body fluids (dehydration).  Heart problems.  Hormone (endocrine) problems.  Pregnancy.  Severe infection.  Lack of certain nutrients.  Severe allergic reactions (anaphylaxis).  Certain medicines, such as blood pressure medicine or medicines that make the body lose excess fluids (diuretics). Sometimes, this condition can be caused by not taking medicine as directed, such as taking too much of a certain medicine. What increases the risk? Certain factors can  make you more likely to develop orthostatic hypotension, including:  Age. Risk increases as you get older.  Conditions that affect the heart or the central nervous system.  Taking certain medicines, such as blood pressure medicine or diuretics.  Being pregnant. What are the signs or symptoms? Symptoms of this condition may include:  Weakness.  Light-headedness.  Dizziness.  Blurred vision.  Fatigue.  Rapid heartbeat.  Fainting, in severe cases. How is this diagnosed? This condition is diagnosed based on:  Your medical history.  Your symptoms.  Your blood pressure measurement. Your health care provider will check your blood pressure when you are:  Lying down.  Sitting.  Standing. A blood pressure reading is recorded as two numbers, such as "120 over 80" (or 120/80). The first ("top") number is called the systolic pressure. It is a measure of the pressure in your arteries as your heart beats. The second ("bottom") number is called the diastolic pressure. It is a measure of the pressure in your arteries when your heart relaxes between beats. Blood pressure is measured in a unit called mm Hg. Healthy blood pressure for adults is 120/80. If your blood pressure is below 90/60, you may be diagnosed with hypotension. Other information or tests that may be used to diagnose orthostatic hypotension include:  Your other vital signs, such as your heart rate and temperature.  Blood tests.  Tilt table test. For this test, you will be safely secured to a table that moves you from a lying position to an upright position. Your heart rhythm and blood pressure  will be monitored during the test. How is this treated? Treatment for this condition may include:  Changing your diet. This may involve eating more salt (sodium) or drinking more water.  Taking medicines to raise your blood pressure.  Changing the dosage of certain medicines you are taking that might be lowering your blood  pressure.  Wearing compression stockings. These stockings help to prevent blood clots and reduce swelling in your legs. In some cases, you may need to go to the hospital for:  Fluid replacement. This means you will receive fluids through an IV tube.  Blood replacement. This means you will receive donated blood through an IV tube (transfusion).  Treating an infection or heart problems, if this applies.  Monitoring. You may need to be monitored while medicines that you are taking wear off. Follow these instructions at home: Eating and drinking  Drink enough fluid to keep your urine clear or pale yellow.  Eat a healthy diet and follow instructions from your health care provider about eating or drinking restrictions. A healthy diet includes:  Fresh fruits and vegetables.  Whole grains.  Lean meats.  Low-fat dairy products.  Eat extra salt only as directed. Do not add extra salt to your diet unless your health care provider told you to do that.  Eat frequent, small meals.  Avoid standing up suddenly after eating. Medicines  Take over-the-counter and prescription medicines only as told by your health care provider.  Follow instructions from your health care provider about changing the dosage of your current medicines, if this applies.  Do not stop or adjust any of your medicines on your own. General instructions  Wear compression stockings as told by your health care provider.  Get up slowly from lying down or sitting positions. This gives your blood pressure a chance to adjust.  Avoid hot showers and excessive heat as directed by your health care provider.  Return to your normal activities as told by your health care provider. Ask your health care provider what activities are safe for you.  Do not use any products that contain nicotine or tobacco, such as cigarettes and e-cigarettes. If you need help quitting, ask your health care provider.  Keep all follow-up visits as  told by your health care provider. This is important. Contact a health care provider if:  You vomit.  You have diarrhea.  You have a fever for more than 2-3 days.  You feel more thirsty than usual.  You feel weak and tired. Get help right away if:  You have chest pain.  You have a fast or irregular heartbeat.  You develop numbness in any part of your body.  You cannot move your arms or your legs.  You have trouble speaking.  You become sweaty or feel lightheaded.  You faint.  You feel short of breath.  You have trouble staying awake.  You feel confused. This information is not intended to replace advice given to you by your health care provider. Make sure you discuss any questions you have with your health care provider. Document Released: 07/24/2002 Document Revised: 04/21/2016 Document Reviewed: 01/24/2016 Elsevier Interactive Patient Education  2017 [employer]

## 2016-08-28 NOTE — Telephone Encounter (Signed)
[facility]: after doing home assessment, would like dr to complete application for home health care assistance and send it to [employer].

## 2016-08-28 NOTE — Assessment & Plan Note (Addendum)
Patient continues to endorse symptoms of orthostatic hypotension with confirmatory orthostatic vitals in clinic. Unfortunately, we are in a difficult position given her lying BP is now elevated to 158/90. Would like to try to improve symptoms without worsening supine hypertension. Patient with improvement in N/V, however still not with great PO intake which could also be contributing to her symptoms.  Discussed case with Dr. Randolm IdolFletke, attending MD.  - will increase to midodrine 10mg  with breakfast and continue 5mg  with lunch and dinner.  - echo ordered. - pt advised to f/u with cardiologist.  - could consider discontinuation of metoprolol all together (HR in the 70s) however run the risk of Afib with RVR. - Zofran PRN ordered, noted this could lead to some drowsiness as well. Discussed improvement in hydration. - discussed importance of lifestyle changes to help prevent falls.  -advised to f/u with PCP early next week. - return precautions discussed.

## 2016-08-31 ENCOUNTER — Ambulatory Visit (HOSPITAL_COMMUNITY): Payer: Medicaid Other

## 2016-08-31 ENCOUNTER — Encounter: Payer: Self-pay | Admitting: Gastroenterology

## 2016-09-08 DIAGNOSIS — G8929 Other chronic pain: Secondary | ICD-10-CM | POA: Insufficient documentation

## 2016-09-08 DIAGNOSIS — R6889 Other general symptoms and signs: Secondary | ICD-10-CM | POA: Insufficient documentation

## 2016-09-16 ENCOUNTER — Other Ambulatory Visit: Payer: Self-pay

## 2016-09-16 ENCOUNTER — Ambulatory Visit (HOSPITAL_COMMUNITY): Payer: Medicaid Other | Attending: Cardiovascular Disease

## 2016-09-16 DIAGNOSIS — R55 Syncope and collapse: Secondary | ICD-10-CM | POA: Insufficient documentation

## 2016-09-16 DIAGNOSIS — I071 Rheumatic tricuspid insufficiency: Secondary | ICD-10-CM | POA: Insufficient documentation

## 2016-09-16 DIAGNOSIS — I4891 Unspecified atrial fibrillation: Secondary | ICD-10-CM | POA: Diagnosis not present

## 2016-09-16 DIAGNOSIS — I951 Orthostatic hypotension: Secondary | ICD-10-CM | POA: Insufficient documentation

## 2016-09-16 DIAGNOSIS — N189 Chronic kidney disease, unspecified: Secondary | ICD-10-CM | POA: Diagnosis not present

## 2016-09-17 ENCOUNTER — Other Ambulatory Visit: Payer: Self-pay | Admitting: Cardiovascular Disease

## 2016-09-21 ENCOUNTER — Other Ambulatory Visit: Payer: Self-pay | Admitting: Internal Medicine

## 2016-09-25 ENCOUNTER — Inpatient Hospital Stay: Payer: Medicaid Other | Admitting: Family Medicine

## 2016-09-28 ENCOUNTER — Ambulatory Visit (INDEPENDENT_AMBULATORY_CARE_PROVIDER_SITE_OTHER): Payer: Medicaid Other | Admitting: Family Medicine

## 2016-09-28 ENCOUNTER — Encounter: Payer: Self-pay | Admitting: Family Medicine

## 2016-09-28 VITALS — BP 140/80 | HR 68 | Temp 98.0°F | Ht 66.0 in | Wt 215.2 lb

## 2016-09-28 DIAGNOSIS — I951 Orthostatic hypotension: Secondary | ICD-10-CM | POA: Diagnosis not present

## 2016-09-28 NOTE — Progress Notes (Signed)
   Subjective:   Patient ID: Toni RosenthalLinda F [name]    DOB: Sep 03, 1950, 66 y.o. female   MRN: [id number]  CC: hospital follow up   HPI: Toni RosenthalLinda F [name] is a 66 y.o. female who presents to clinic today for hospital follow up.  Patient with PMH of a-fib and bradycardia, HTN, HLD, insulin dependent DM with poor control, chronic pain, opioid dependence, and obesity.  [facility]  for unspecified convulsions and to rule out seizure disorder. She was not found to have seizures and has stopped Keppra.  She first began having syncopal episodes last year and was started on Midodrine 5 mg tid when [facility] to help with her orthostatic hypotension.  She has not needed to stay on it as of [facility].  Patient reports that sometimes her BP has been up as a high as 190s/100s and that she is afraid taking Midodrine will cause her blood pressure to become too elevated again.    Patient reports that she has been doing well since discharge on 09/11/2016 and has been checking her BP several times a day at home.  Checks it laying down, sitting and standing everyday.  Most recently, her pressures have been around 140/80 and every once in a while 120/80.  Has not had any lower concerning pressures.  She takes Metoprolol twice a day and reports good medication compliance.   ROS: Denies fevers, chills, chest pain, dizziness, shortness of breath.    Smoking status reviewed.  Patient is a never smoker.    Medications reviewed.  Objective:   BP 140/80 (BP Location: Left Arm, Patient Position: Sitting, Cuff Size: Normal)   Pulse 68   Temp 98 F (36.7 C) (Oral)   Ht 5\' 6"  (1.676 m)   Wt 215 lb 3.2 oz (97.6 kg)   SpO2 97%   BMI 34.73 kg/m  Vitals and nursing note reviewed.  General: well nourished, well developed, in NAD HEENT: NCAT, MMM Neck: supple, non-tender CV: RRR, no MRG  Lungs: clear bilaterally with normal work of breathing Abdomen: soft,  non-tender, +bs Skin: warm, dry, no rashes or lesions Extremities: warm and well perfused, normal tone Psych: mood appropriate  Assessment & Plan:   Orthostatic hypotension Stable.  Orthostatic vitals repeated at today's visit and are as follows: Supine (150/90), Seated (140/80), Standing (120/70).  -no changes to blood pressure medications made at this time - Denies dizziness, falls, chest pain, palpitations.   -Advised patient to continue to monitor bp at home as she has been doing -follow up in 1 month or sooner if needed  Follow up : 1 month  Freddrick MarchYashika Archana Eckman, MD [facility], PGY-1 09/30/2016 11:31 AM

## 2016-09-28 NOTE — Patient Instructions (Signed)
It was nice meeting you today! You were seen in clinic for a hospital follow-up visit.  Your blood pressure was checked in 3 positions and you do continue to have orthostatic hypotension.  No changes to your blood pressure medications will be made as of today and we will continue to follow you for this.  Please follow up in 1 month.

## 2016-09-30 NOTE — Assessment & Plan Note (Addendum)
Stable.  Orthostatic vitals repeated at today's visit and are as follows: Supine (150/90), Seated (140/80), Standing (120/70).  -no changes to blood pressure medications made at this time - Denies dizziness, falls, chest pain, palpitations.   -Advised patient to continue to monitor bp at home as she has been doing -follow up in 1 month or sooner if needed

## 2016-10-01 ENCOUNTER — Telehealth: Payer: Self-pay | Admitting: Internal Medicine

## 2016-10-01 NOTE — Telephone Encounter (Signed)
Pt says the pharmacy will not refill her prescription because she is taking it differently from than the original prescription. Please send a corrected RX to [facility] on [address]

## 2016-10-03 ENCOUNTER — Other Ambulatory Visit: Payer: Self-pay | Admitting: Internal Medicine

## 2016-10-12 ENCOUNTER — Telehealth: Payer: Self-pay | Admitting: Internal Medicine

## 2016-10-12 NOTE — Telephone Encounter (Signed)
Pt is calling because the doctor increased her Metoprolol to one tablet a day. The prescription still says 1/2 tablet once a day. The pharmacy told her to get a new prescription sent in so that she can get the correct amount of medication. Please do this ASAP so she can pick up her medication. jw

## 2016-10-12 NOTE — Progress Notes (Signed)
Patient ID: [name], female   DOB: 1951/07/15, 66 y.o.   MRN: [id number]   66 y.o. with  HTN and PAF On tikosyn. Hospitalization 5/15  with PAF converted with cardizem Xarelto started. Normal cath in 2012  Echo 5/24 normal EF and only mild LAE  Study Conclusions  - Left ventricle: The cavity size was normal. Wall thickness was increased in a pattern of mild LVH. Systolic function was normal. The estimated ejection fraction was in the range of 60% to 65%. Wall motion was normal; there were no regional wall motion abnormalities. Doppler parameters are consistent with abnormal left ventricular relaxation (grade 1 diastolic dysfunction). - Left atrium: The atrium was mildly dilated.  Doing well with no recurrent palpitations   Seizure disorder followed by neurology Last one also in 5/15 Keppra increased at that time  Still having them and will be going to [facility] for 2nd opinion soon   Discussed need to stay on xarelto with recurrent afib and [place] VASC score of 3    Seen at [facility] 12/15 with UTI and orthostasis Meds adjusted Rx with Ceftin  Less postural symptoms No PAF recurrence    DM: lattuda not effective and stopped  08/23/15  Had URI in hospital R/O PAF converted with cardizem  Still on tikosyn  Seen at [facility] last month with "spells" monitored for seizures BS poorly controlled A1c over 8 taking narcotics And percocet 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  Sleep attacks thought to be from polypharmacy Since EEG negative taken off Keppra and given Lyrica for pain She had seizures from age of [age] recurred 2012 Sees Dr Marjory Lies and [facility]   ROS: Denies fever, malais, weight loss, blurry vision, decreased visual acuity, cough, sputum, SOB, hemoptysis, pleuritic pain, palpitaitons, heartburn, abdominal pain, melena, lower extremity edema, claudication, or rash.  All other systems reviewed and negative  General: Affect appropriate Black female ambulating with walker  HEENT:  normal Neck supple with no adenopathy JVP normal no bruits no thyromegaly Lungs clear with no wheezing and good diaphragmatic motion Heart:  S1/S2 no murmur, no rub, gallop or click PMI normal Abdomen: benighn, BS positve, no tenderness, no AAA no bruit.  No HSM or HJR Distal pulses intact with no bruits No edema Neuro non-focal Skin warm and dry No muscular weakness   Current Outpatient Prescriptions  Medication Sig Dispense Refill  . atorvastatin (LIPITOR) 80 MG tablet Take 80 mg by mouth at bedtime.  0  . calcium carbonate (OS-CAL) 600 MG TABS tablet Take 600 mg by mouth daily.    . cholecalciferol (VITAMIN D) 1000 UNITS tablet Take 1,000 Units by mouth 2 (two) times daily.     . Cranberry 500 MG CAPS Take 500 mg by mouth 2 (two) times daily.    Marland Kitchen dofetilide ([name]) 250 MCG capsule take 1 capsule by mouth twice a day 180 capsule 0  . DULoxetine (CYMBALTA) 60 MG capsule Take 60 mg by mouth at bedtime.     Marland Kitchen esomeprazole (NEXIUM) 40 MG capsule Take 1 capsule (40 mg total) by mouth 2 (two) times daily before a meal. 60 capsule 6  . EVZIO 0.4 MG/0.4ML SOAJ Inject 0.4 mLs as directed daily as needed (over dose).   0  . fluticasone (FLONASE) 50 MCG/ACT nasal spray Place 2 sprays into both nostrils daily. 16 g 6  . Insulin Glargine (LANTUS SOLOSTAR) 100 UNIT/ML Solostar Pen Inject 30 Units into the skin at bedtime. (Patient taking differently: Inject 40 Units into the skin  at bedtime. )    . loratadine (CLARITIN) 10 MG tablet take 1 tablet by mouth once daily 30 tablet 1  . Magnesium Oxide 400 (240 Mg) MG TABS take 1 tablet by mouth once daily (Patient taking differently: Take 400 mg by mouth once daily) 30 tablet 3  . metoprolol tartrate (LOPRESSOR) 25 MG tablet take 1/2 OF A tablet by mouth twice a day 60 tablet 0  . mometasone-formoterol (DULERA) 200-5 MCG/ACT AERO Inhale 2 puffs into the lungs 2 (two) times daily.    . nitroGLYCERIN (NITROSTAT) 0.4 MG SL tablet Place 1 tablet (0.4 mg  total) under the tongue every 5 (five) minutes as needed for chest pain (do nto exceed 3 doses). 25 tablet 2  . NOVOLOG FLEXPEN 100 UNIT/ML FlexPen Inject 18-22 Units as directed 2 (two) times daily. Per sliding scale  0  . ondansetron (ZOFRAN ODT) 4 MG disintegrating tablet Take 1 tablet (4 mg total) by mouth every 8 (eight) hours as needed for nausea or vomiting. 10 tablet 0  . oxyCODONE-acetaminophen (PERCOCET) 10-325 MG tablet Take 1 tablet by mouth 5 (five) times daily. (scheduled)     . Polyvinyl Alcohol-Povidone (REFRESH OP) Place 1 drop into both eyes as needed (for dry eyes).    . potassium chloride (K-DUR) 10 MEQ tablet take 1 tablet by mouth once daily 90 tablet 0  . pregabalin (LYRICA) 100 MG capsule Take 100 mg by mouth 3 (three) times daily.    Marland Kitchen. PROAIR HFA 108 (90 Base) MCG/ACT inhaler inhale 2 puffs every 6 hours if needed for wheezing OR SHORTNESS OF BREATH. 8.5 g 1  . vitamin C (ASCORBIC ACID) 500 MG tablet Take 500 mg by mouth 2 (two) times daily.    . VOLTAREN 1 % GEL APPLY 2 INCHES TO AFFECTED AREA four times a day 500 g 3  . XARELTO 20 MG TABS tablet take 1 tablet by mouth once daily with SUPPER (Patient taking differently: TAKE 20 MG BY MOUTH DAILY WITH SUPPER) 30 tablet 11   No current facility-administered medications for this visit.     Allergies  Prednisone; Amitriptyline; and Hydromorphone hcl  Electrocardiogram:  5/15 SR rate 61 inferolateral T wave changes  QT 444   10/30/15  SR rate 70  Nonspecific ST changes QT 414   Assessment and Plan  PAF: maint NSR no palpitations continue xarelto and tikosyn DM:  Discussed low carb diet.  Target hemoglobin A1c is 6.5 or less.  Continue current medications. Seizures:  [facility]  Anticoagulation  On NOAC no bleeding issues Cr stable  Chol  Labs with primary Bipolar:  F/u behavioral health better with Abilify GERD:  F/u GI see if she can get non generic nexium  HTN:  Will take whole tab  lopressor if feels she is having palpitations otherwise continue 1/2 bid         F/U with me 6 months

## 2016-10-13 NOTE — Telephone Encounter (Signed)
Called patient to discussion situation. Per patient she was told to continue Lopressor at 25 mg BID, however nothing was noted in any recent notes. Previously started on 12.5 mg of lopressor BID by cardiology. Patient is concerned her blood pressure has been elevated recently, though most recent standing blood pressures have been well controlled in flowsheet. Furthermore has a hx significant for orthostatic hypotension. Patient is planning to see cardiology tomorrow morning. Therefore discussed with patient, will keep lopressor the same for now, follow up with cardiology tomorrow.

## 2016-10-15 ENCOUNTER — Ambulatory Visit (INDEPENDENT_AMBULATORY_CARE_PROVIDER_SITE_OTHER): Payer: Medicaid Other | Admitting: Cardiovascular Disease

## 2016-10-15 ENCOUNTER — Encounter: Payer: Self-pay | Admitting: Cardiovascular Disease

## 2016-10-15 DIAGNOSIS — N183 Chronic kidney disease, stage 3 unspecified: Secondary | ICD-10-CM

## 2016-10-15 MED ORDER — METOPROLOL TARTRATE 25 MG PO TABS: 25.0000 mg | ORAL_TABLET | Freq: Two times a day (BID) | ORAL | 3 refills | Status: DC

## 2016-10-15 MED ORDER — NITROGLYCERIN 0.4 MG SL SUBL: 0.4000 mg | SUBLINGUAL_TABLET | SUBLINGUAL | 2 refills | Status: DC | PRN

## 2016-10-15 NOTE — Patient Instructions (Signed)

## 2016-10-15 NOTE — Addendum Note (Signed)
Addended by: Dossie ArbourADELMAN, Kodi Steil L on: 10/15/2016 12:06 PM   Modules accepted: Orders

## 2016-10-21 ENCOUNTER — Encounter: Payer: Self-pay | Admitting: Internal Medicine

## 2016-10-21 ENCOUNTER — Ambulatory Visit (INDEPENDENT_AMBULATORY_CARE_PROVIDER_SITE_OTHER): Payer: Medicaid Other | Admitting: Internal Medicine

## 2016-10-21 VITALS — BP 124/62 | HR 64 | Temp 98.2°F | Wt 217.0 lb

## 2016-10-21 DIAGNOSIS — G40909 Epilepsy, unspecified, not intractable, without status epilepticus: Secondary | ICD-10-CM

## 2016-10-21 DIAGNOSIS — F319 Bipolar disorder, unspecified: Secondary | ICD-10-CM | POA: Diagnosis not present

## 2016-10-21 DIAGNOSIS — Z794 Long term (current) use of insulin: Secondary | ICD-10-CM | POA: Diagnosis not present

## 2016-10-21 DIAGNOSIS — I10 Essential (primary) hypertension: Secondary | ICD-10-CM

## 2016-10-21 DIAGNOSIS — E118 Type 2 diabetes mellitus with unspecified complications: Secondary | ICD-10-CM

## 2016-10-21 DIAGNOSIS — IMO0002 Reserved for concepts with insufficient information to code with codable children: Secondary | ICD-10-CM

## 2016-10-21 DIAGNOSIS — E1165 Type 2 diabetes mellitus with hyperglycemia: Secondary | ICD-10-CM | POA: Diagnosis not present

## 2016-10-21 LAB — BASIC METABOLIC PANEL WITH GFR
BUN: 11 mg/dL (ref 7–25)
CHLORIDE: 103 mmol/L (ref 98–110)
CO2: 25 mmol/L (ref 20–31)
CREATININE: 1.23 mg/dL — AB (ref 0.50–0.99)
Calcium: 9.6 mg/dL (ref 8.6–10.4)
GFR, Est African American: 54 mL/min — ABNORMAL LOW (ref >=60)
GFR, Est Non African American: 46 mL/min — ABNORMAL LOW (ref >=60)
GLUCOSE: 203 mg/dL — AB (ref 65–99)
Potassium: 4.3 mmol/L (ref 3.5–5.3)
Sodium: 141 mmol/L (ref 135–146)

## 2016-10-21 LAB — POCT GLYCOSYLATED HEMOGLOBIN (HGB A1C): Hemoglobin A1C: 10

## 2016-10-21 LAB — TSH: TSH: 0.51 m[IU]/L

## 2016-10-21 NOTE — Progress Notes (Signed)
   [facility] Noralee CharsAsiyah Zarie Kosiba, MD Phone: [phone]  Reason For Visit: F/U visit   # HTN: Reports have elevated blood pressures at home. However, has had a hx of orthostatic hypotension and there has not been on any blood pressure medications. Patient however has been taking lopressor at higher doses when her blood pressure is elevated  Current Meds - Lopressor  Reports good compliance, took meds today. Tolerating well, w/o complaints. Lifestyle - Discussed patient getting out more and becoming more active  Denies CP, dyspnea, HA, edema, dizziness / lightheadedness  #CHRONIC DM, Type 2: Reports no concerns - Followed by endocrine. Has not been doing well with blood sugars. States she tends to eat badly when grandson is not around  CBGs: Avg CBGs 200s  Meds: Lantus 44 units, 18 -23 units of Novlog  Reports  good compliance. Tolerating well w/o side-effect Any hypoglycemia episodes: Denies palpations, diaphoresis, fatigue, weakness, jittery Denies polyuria, visual changes, numbness or tingling.  Seizures   - Hx of grand mal seizures, [facility]  Deprivation and fasting while brain activity was being monitored. They don't believe patient has seizures as no seizure like activity occurred.   Biopolar Disorder I - No longer on a mood stabilizer - Continuing Cymbalta  - Meets with Psychiatry every week  - No depression, anxiety or SI   Past Medical History Reviewed problem list.  Medications- reviewed and updated No additions to family history Social history- patient is a non smoker  Objective: BP 124/62   Pulse 64   Temp 98.2 F (36.8 C) (Oral)   Wt 217 lb (98.4 kg)   SpO2 97%   BMI 35.02 kg/m  Gen: NAD, alert, cooperative with exam Cardio: regular rate and rhythm, S1S2 heard, no murmurs appreciated Pulm: clear to auscultation bilaterally, no wheezes, rhonchi or rales Skin: dry, intact, no rashes or  lesions  Assessment/Plan: See problem based a/p  Essential hypertension Indicates being hypertensive at home, regularly checks blood pressures. Not currently on any blood pressures meds, however has been increasing Lopressor from 12.5 to 25 mg (prescribed for afib). Discussed with patient that if blood pressure trend continues may decided to start her on a low dose ARB as she has significant CKD - Serum Creatinine elevated at last ED visit, will recheck today  - Follow up in a month  - Patient to record blood pressures over the next month   Diabetes mellitus type 2, uncontrolled, with complications Follows with endocrine  Has not been making great eating choices lately  Will continue to follow with specialty team   Seizure disorder Per review of [facility] neuro notes, does not believe patient has a seizure disorder after 3 days of testing. Stopped Keppra indicated that patient could possibly be suffering from these spells due to polypharmacy   Bipolar I disorder (HCC) Controlled, see psychiatry weekly  Continuing Cymbalta

## 2016-10-21 NOTE — Patient Instructions (Signed)
We will recheck your kidney function today as well as your thyroid. Please follow-up in about 1 month regarding your blood pressure. If you are requiring a increased dose of Lopressor consistently then we might try to put you on lisinopril to help protect your kidneys.

## 2016-10-23 NOTE — Assessment & Plan Note (Signed)
Controlled, see psychiatry weekly  Continuing Cymbalta

## 2016-10-23 NOTE — Assessment & Plan Note (Signed)
Indicates being hypertensive at home, regularly checks blood pressures. Not currently on any blood pressures meds, however has been increasing Lopressor from 12.5 to 25 mg (prescribed for afib). Discussed with patient that if blood pressure trend continues may decided to start her on a low dose ARB as she has significant CKD - Serum Creatinine elevated at last ED visit, will recheck today  - Follow up in a month  - Patient to record blood pressures over the next month

## 2016-10-23 NOTE — Assessment & Plan Note (Signed)
Per review of [facility] neuro notes, does not believe patient has a seizure disorder after 3 days of testing. Stopped Keppra indicated that patient could possibly be suffering from these spells due to polypharmacy

## 2016-10-23 NOTE — Assessment & Plan Note (Signed)
Follows with endocrine  Has not been making great eating choices lately  Will continue to follow with specialty team

## 2016-11-02 ENCOUNTER — Telehealth: Payer: Self-pay | Admitting: *Deleted

## 2016-11-02 NOTE — Telephone Encounter (Signed)
Spoke with patient and informed her Dr Marjory LiesPenumalli will be unavailable for her follow up 4/27. Rescheduled for may 7; pt verbalized understanding.

## 2016-11-03 ENCOUNTER — Other Ambulatory Visit: Payer: Self-pay | Admitting: Internal Medicine

## 2016-11-03 ENCOUNTER — Other Ambulatory Visit: Payer: Self-pay | Admitting: Physician Assistant

## 2016-11-03 ENCOUNTER — Telehealth: Payer: Self-pay | Admitting: Internal Medicine

## 2016-11-04 NOTE — Telephone Encounter (Signed)
Okay to refill? Please advise. Thanks, MI 

## 2016-11-25 ENCOUNTER — Encounter: Payer: Self-pay | Admitting: Internal Medicine

## 2016-11-25 ENCOUNTER — Ambulatory Visit (INDEPENDENT_AMBULATORY_CARE_PROVIDER_SITE_OTHER): Payer: Medicaid Other | Admitting: Internal Medicine

## 2016-11-25 DIAGNOSIS — I10 Essential (primary) hypertension: Secondary | ICD-10-CM | POA: Diagnosis not present

## 2016-11-25 DIAGNOSIS — R102 Pelvic and perineal pain: Secondary | ICD-10-CM | POA: Diagnosis present

## 2016-11-25 DIAGNOSIS — Z86010 Personal history of colonic polyps: Secondary | ICD-10-CM

## 2016-11-25 LAB — POCT URINALYSIS DIP (MANUAL ENTRY)
BILIRUBIN UA: NEGATIVE
Blood, UA: NEGATIVE
Glucose, UA: 500 mg/dL — AB
Nitrite, UA: NEGATIVE
PH UA: 5.5 (ref 5.0–8.0)
Protein Ur, POC: 100 mg/dL — AB
Urobilinogen, UA: 0.2 E.U./dL

## 2016-11-25 NOTE — Progress Notes (Signed)
   [name] [name], MD Phone: [phone]  Reason For Visit: F/U for blood pressure and Abdominal Pain   # ABDOMINAL PAIN  Pelvic pain which has been ongoing for about 1 week  Medications tried: none  Similar pain before: yes, patient has irritable bowel sydrome Prior abdominal surgeries: hysterectomy   Symptoms Nausea/vomiting: none  Diarrhea: none  Constipation: none, daily bowel movement Fever: no Dysuria: some pain with urination, no increased frequency or urgency with urination    CHRONIC HTN: Recently concerned about elevated blood pressures at home. Not on any blood pressure medications. Follow up to see how blood pressure is doing. States blood pressure have been less than 150/80 at home. Most between the 120s and 130s. Patient with hx of orthostatic hypotension, therefore caution about provide treatment for blood pressure  Denies CP, dyspnea, HA, edema, dizziness / lightheadedness   Past Medical History Reviewed problem list.  Medications- reviewed and updated No additions to family history Social history- patient is a non-smoker  Objective: BP 115/82 (BP Location: Left Arm, Patient Position: Sitting, Cuff Size: Normal)   Pulse 85   Temp 97.8 F (36.6 C) (Oral)   Ht  (1.676 m)   Wt 215 lb 3.2 oz (97.6 kg)   SpO2 98%   BMI 34.73 kg/m  Gen: NAD, alert, cooperative with exam Cardio: regular rate and rhythm, S1S2 heard, no murmurs appreciated Pulm: clear to auscultation bilaterally, no wheezes, rhonchi or rales GI: soft, slight pelvic tenderness, bowel sounds present, no masses    Assessment/Plan: See problem based a/p  Essential hypertension Blood pressure well controlled on no medications. Continue to be careful about treating blood pressure as patient with long-standing hx of orthostatic hypotension   Pelvic pain Pelvic pain (slight) + dysuria likely UTI, total hysterectomy   - Will get UA and Urine culture  -  Will consider treating based on UA as microscopy was not unobtainable 2/2 amount of urine provided  - noted patient due for colonoscopy - discussed with patient. Patient plans to follow up with GI for this issue   History of colonic polyps Follow up for colonoscopy due this year

## 2016-11-25 NOTE — Patient Instructions (Signed)
Blood pressure looks good. I will check your urine for signs of infection. Call [facility] back about the colonoscopy, let me know if any issues. Follow up in 3 months

## 2016-11-26 NOTE — Assessment & Plan Note (Signed)
Follow up for colonoscopy due this year

## 2016-11-26 NOTE — Assessment & Plan Note (Signed)
Blood pressure well controlled on no medications. Continue to be careful about treating blood pressure as patient with long-standing hx of orthostatic hypotension

## 2016-11-26 NOTE — Assessment & Plan Note (Addendum)
Pelvic pain (slight) + dysuria likely UTI, total hysterectomy   - Will get UA and Urine culture  - Will consider treating based on UA as microscopy was not unobtainable 2/2 amount of urine provided  - noted patient due for colonoscopy - discussed with patient. Patient plans to follow up with GI for this issue

## 2016-11-27 ENCOUNTER — Other Ambulatory Visit (HOSPITAL_BASED_OUTPATIENT_CLINIC_OR_DEPARTMENT_OTHER): Payer: Self-pay

## 2016-11-27 DIAGNOSIS — R454 Irritability and anger: Secondary | ICD-10-CM

## 2016-11-27 DIAGNOSIS — R5383 Other fatigue: Secondary | ICD-10-CM

## 2016-11-27 DIAGNOSIS — G4710 Hypersomnia, unspecified: Secondary | ICD-10-CM

## 2016-11-27 DIAGNOSIS — G4700 Insomnia, unspecified: Secondary | ICD-10-CM

## 2016-11-27 DIAGNOSIS — R0683 Snoring: Secondary | ICD-10-CM

## 2016-11-27 DIAGNOSIS — G4730 Sleep apnea, unspecified: Secondary | ICD-10-CM

## 2016-11-27 LAB — MICROALBUMIN / CREATININE URINE RATIO
CREATININE, UR: 166.7 mg/dL
MICROALB/CREAT RATIO: 268.9 mg/g{creat} — AB (ref 0.0–30.0)
Microalbumin, Urine: 448.2 ug/mL

## 2016-11-27 LAB — URINE CULTURE

## 2016-12-01 ENCOUNTER — Encounter: Payer: Self-pay | Admitting: Gastroenterology

## 2016-12-01 ENCOUNTER — Encounter (INDEPENDENT_AMBULATORY_CARE_PROVIDER_SITE_OTHER): Payer: Self-pay

## 2016-12-01 ENCOUNTER — Other Ambulatory Visit (INDEPENDENT_AMBULATORY_CARE_PROVIDER_SITE_OTHER): Payer: Medicaid Other

## 2016-12-01 ENCOUNTER — Ambulatory Visit (INDEPENDENT_AMBULATORY_CARE_PROVIDER_SITE_OTHER): Payer: Medicaid Other | Admitting: Gastroenterology

## 2016-12-01 VITALS — BP 140/76 | HR 68 | Ht 66.0 in | Wt 221.4 lb

## 2016-12-01 DIAGNOSIS — R1031 Right lower quadrant pain: Secondary | ICD-10-CM

## 2016-12-01 LAB — CBC WITH DIFFERENTIAL/PLATELET
BASOS PCT: 0.8 % (ref 0.0–3.0)
Basophils Absolute: 0 10*3/uL (ref 0.0–0.1)
EOS PCT: 1.8 % (ref 0.0–5.0)
Eosinophils Absolute: 0.1 10*3/uL (ref 0.0–0.7)
HCT: 38.1 % (ref 36.0–46.0)
Hemoglobin: 12.5 g/dL (ref 12.0–15.0)
LYMPHS ABS: 1.6 10*3/uL (ref 0.7–4.0)
Lymphocytes Relative: 43 % (ref 12.0–46.0)
MCHC: 32.9 g/dL (ref 30.0–36.0)
MCV: 90.2 fl (ref 78.0–100.0)
MONO ABS: 0.2 10*3/uL (ref 0.1–1.0)
Monocytes Relative: 6.7 % (ref 3.0–12.0)
NEUTROS ABS: 1.8 10*3/uL (ref 1.4–7.7)
NEUTROS PCT: 47.7 % (ref 43.0–77.0)
PLATELETS: 174 10*3/uL (ref 150.0–400.0)
RBC: 4.23 Mil/uL (ref 3.87–5.11)
RDW: 13.3 % (ref 11.5–15.5)
WBC: 3.7 10*3/uL — ABNORMAL LOW (ref 4.0–10.5)

## 2016-12-01 LAB — COMPREHENSIVE METABOLIC PANEL
ALBUMIN: 4.3 g/dL (ref 3.5–5.2)
ALK PHOS: 100 U/L (ref 39–117)
ALT: 16 U/L (ref 0–35)
AST: 18 U/L (ref 0–37)
BILIRUBIN TOTAL: 0.6 mg/dL (ref 0.2–1.2)
BUN: 9 mg/dL (ref 6–23)
CALCIUM: 9.2 mg/dL (ref 8.4–10.5)
CO2: 28 mEq/L (ref 19–32)
CREATININE: 1.19 mg/dL (ref 0.40–1.20)
Chloride: 103 mEq/L (ref 96–112)
GFR: 58.59 mL/min — AB (ref >60.00)
Glucose, Bld: 301 mg/dL — ABNORMAL HIGH (ref 70–99)
Potassium: 4 mEq/L (ref 3.5–5.1)
Sodium: 138 mEq/L (ref 135–145)
Total Protein: 7.3 g/dL (ref 6.0–8.3)

## 2016-12-01 NOTE — Progress Notes (Signed)
Review of pertinent gastrointestinal problems:  1. chronic GERD. EGD February 2013 showed mild gastritis, H. pylori negative on biopsy. On proton pump inhibitor once daily and nightly Zantac  2. history of adenomatous polyps: Colonoscopy February 2013 found diverticulosis but no polyps. Recall colonoscopy at 5 year interval given 2008 adenomatous polyps  3. hoarseness, coughing, intermittent shortness of breath; not clear if this is related to her GERD but seems unlikely (5/13); was referred to ear nose and throat physician who ordered MBSS 2013: Clinical impression: Pt demonstrated appearance of a primary esophageal dysphagia. Oral and oropharyngeal function within normal limits with no aspiration or penetration. Esophageal sweep showed appearance of slow emptying of thin liquids though GE juntction. After about 4 oz consumption of liquid barium. Pt began coughing and gagging, expectorating barium tinged secretions. Pt attempted to consume puree and could not due to hard coughing and gagging. Pt would benefit from further assessment by GI. 02/2012 Esophageal manometry was completely normal.   HPI: This is a pleasant 66 year old woman whom I last saw about a year and a half ago.  Lower abdominal pain for 3-4 weeks ago.  Feels like a knife  Worse on right than left.  The pain is constant..  Not worse with eating.  Hurts laying down and it radiates down her right leg.  Sitting makes it worse.  She has constant back pains.  Overall gaining weight lately.  Nausea without vomiting.  No fevers or chills  Chief complaint is right lower quadrant pain  ROS: complete GI ROS as described in HPI.  Constitutional:  No unintentional weight loss   Past Medical History:  Diagnosis Date  . Anxiety   . Arthritis    "knees, hands, ankles, feet" (05/12/2016)  . Asthma    Dr. [name]  . Atrial fibrillation (HCC)   . Bipolar disorder (HCC)   . Chronic back pain    "lower and middle" (05/12/2016)  . CKD  (chronic kidney disease), stage III   . Colon polyps   . Depression   . Diverticulosis   . Epilepsy (HCC)   . Essential hypertension   . Fibromyalgia   . Gallstones   . GERD (gastroesophageal reflux disease)   . H/O hiatal hernia   . Hyperlipidemia   . IBS (irritable bowel syndrome)   . Kidney stones    "passed them all" (05/12/2016)  . Migraine    05/12/2016 "daily since Sunday; they had calmed to once q 6 months or so"  . Paroxysmal A-fib (HCC)    a. On [name]. b. Recurrence 08/2014 in setting of GI illness.  . Peripheral neuropathy   . Sarcoidosis of lung (HCC)    Dr. [name]  . Seizures (HCC)    "epileptic; pretty regular recently" (05/12/2016)  . Type II diabetes mellitus (HCC)     Past Surgical History:  Procedure Laterality Date  . ABDOMINAL HYSTERECTOMY  1995  . ANTERIOR CERVICAL DECOMP/DISCECTOMY FUSION  07/11/2012   Procedure: ANTERIOR CERVICAL DECOMPRESSION/DISCECTOMY FUSION 1 LEVEL;  Surgeon: [name], MD;  Location: [facility];  Service: Neurosurgery;  Laterality: N/A;  Cervical Five-Six Anterior Cervical Decompression with Fusion Interbody Prothesis Plating and Bonegraft  . CARDIAC CATHETERIZATION  2012   a. Normal coronaries 2012. b. Normal nuc 09/2014.  Marland Kitchen CHOLECYSTECTOMY OPEN  1976  . CLOSED REDUCTION ANKLE FRACTURE Left 10/2006   "got steel rod in my leg; and screws"  . COLON SURGERY    . DILATION AND CURETTAGE OF UTERUS    .  ESOPHAGEAL MANOMETRY  03/21/2012   Procedure: ESOPHAGEAL MANOMETRY (EM);  Surgeon: [name], MD;  Location: [facility];  Service: Endoscopy;  Laterality: N/A;  . FRACTURE SURGERY    . NEUROPLASTY / TRANSPOSITION MEDIAN NERVE AT CARPAL TUNNEL Left 2004  . NEUROPLASTY / TRANSPOSITION MEDIAN NERVE AT CARPAL TUNNEL Right 2002  . 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 SALPINGOOPHORECTOMY Bilateral 2000  . TUBAL LIGATION  1982    Current Outpatient Prescriptions  Medication Sig Dispense Refill  . atorvastatin (LIPITOR) 80  MG tablet Take 80 mg by mouth at bedtime.  0  . calcium carbonate (OS-CAL) 600 MG TABS tablet Take 600 mg by mouth daily.    . cholecalciferol (VITAMIN D) 1000 UNITS tablet Take 1,000 Units by mouth 2 (two) times daily.     . Cranberry 500 MG CAPS Take 500 mg by mouth 2 (two) times daily.    Marland Kitchen dofetilide ([name]) 250 MCG capsule take 1 capsule by mouth twice a day 180 capsule 0  . DULERA 100-5 MCG/ACT AERO inhale 2 puffs INTO THE LUNGS every 12 hours 13 g 11  . DULoxetine (CYMBALTA) 60 MG capsule Take 60 mg by mouth at bedtime.     Marland Kitchen esomeprazole (NEXIUM) 40 MG capsule Take 1 capsule (40 mg total) by mouth 2 (two) times daily before a meal. 60 capsule 6  . EVZIO 0.4 MG/0.4ML SOAJ Inject 0.4 mLs as directed daily as needed (over dose).   0  . fluticasone (FLONASE) 50 MCG/ACT nasal spray instill 2 sprays into each nostril once daily 16 g 6  . Insulin Glargine (LANTUS SOLOSTAR) 100 UNIT/ML Solostar Pen Inject 30 Units into the skin at bedtime. (Patient taking differently: Inject 40 Units into the skin at bedtime. )    . loratadine (CLARITIN) 10 MG tablet take 1 tablet by mouth once daily 30 tablet 1  . magnesium oxide (MAG-OX) 400 MG tablet take 1 tablet by mouth once daily 30 tablet 11  . metoprolol tartrate (LOPRESSOR) 25 MG tablet Take 1 tablet (25 mg total) by mouth 2 (two) times daily. 180 tablet 3  . nitroGLYCERIN (NITROSTAT) 0.4 MG SL tablet Place 1 tablet (0.4 mg total) under the tongue every 5 (five) minutes as needed for chest pain (do nto exceed 3 doses). 25 tablet 2  . NOVOLOG FLEXPEN 100 UNIT/ML FlexPen Inject 18-22 Units as directed 2 (two) times daily. Per sliding scale  0  . ondansetron (ZOFRAN ODT) 4 MG disintegrating tablet Take 1 tablet (4 mg total) by mouth every 8 (eight) hours as needed for nausea or vomiting. 10 tablet 0  . oxyCODONE-acetaminophen (PERCOCET) 10-325 MG tablet Take 1 tablet by mouth 5 (five) times daily. (scheduled)     . Polyvinyl Alcohol-Povidone (REFRESH OP)  Place 1 drop into both eyes as needed (for dry eyes).    . potassium chloride (K-DUR) 10 MEQ tablet take 1 tablet by mouth once daily 90 tablet 0  . pregabalin (LYRICA) 100 MG capsule Take 100 mg by mouth 3 (three) times daily.    Marland Kitchen PROAIR HFA 108 (90 Base) MCG/ACT inhaler inhale 2 puffs every 6 hours if needed for wheezing OR SHORTNESS OF BREATH. 8.5 g 1  . vitamin C (ASCORBIC ACID) 500 MG tablet Take 500 mg by mouth 2 (two) times daily.    . VOLTAREN 1 % GEL APPLY 2 INCHES TO AFFECTED AREA four times a day 500 g 3  . XARELTO 20 MG TABS tablet take 1 tablet by  mouth once daily with SUPPER (Patient taking differently: TAKE 20 MG BY MOUTH DAILY WITH SUPPER) 30 tablet 11   No current facility-administered medications for this visit.     Allergies as of 12/01/2016 - Review Complete 12/01/2016  Allergen Reaction Noted  . Prednisone Other (See Comments) 07/07/2012  . Amitriptyline Other (See Comments) 08/25/2011  . Hydromorphone hcl Other (See Comments)     Family History  Problem Relation Age of Onset  . Heart attack Mother     @ age 102  . Mental illness Mother   . Diabetes Mother   . Hypertension Mother     siblings  . Alzheimer's disease Mother   . Depression Mother   . Hyperlipidemia Mother   . Heart attack Brother 50  . Alcohol abuse Brother   . Depression Brother   . Diabetes Brother   . Hyperlipidemia Brother   . Hypertension Brother   . Kidney disease Brother   . Drug abuse Brother   . Alcohol abuse Father   . Heart attack Father   . Hyperlipidemia Father   . Hypertension Father   . Colon cancer Maternal Aunt   . Prostate cancer Maternal Grandfather   . Diabetes Maternal Grandfather   . Hyperlipidemia Maternal Grandfather   . Ovarian cancer Maternal Aunt   . Diabetes    . Hypertension    . Lupus Sister   . Alcohol abuse Sister   . Depression Sister   . Diabetes Sister   . Hyperlipidemia Sister   . Hypertension Sister   . Kidney disease Sister   . Drug abuse  Sister   . Ovarian cancer Cousin   . Diabetes Maternal Grandmother   . Hyperlipidemia Maternal Grandmother     Social History   Social History  . Marital status: Divorced    Spouse name: N/A  . Number of children: 3  . Years of education: 12th   Occupational History  . disabled     CNA   Social History Main Topics  . Smoking status: Never Smoker  . Smokeless tobacco: Never Used  . Alcohol use No  . Drug use: No  . Sexual activity: Not Currently   Other Topics Concern  . Not on file   Social History Narrative   Pt. Lives at home with her daughter. She is single, has three children, does not work currently, and has a 12th grade education level. She has never used tobacco or alcohol. Quit using illicit drugs at the age of 52 and rarely ha caffeine.     Physical Exam: BP 140/76   Pulse 68   Ht  (1.676 m)   Wt 221 lb 6.4 oz (100.4 kg)   BMI 35.73 kg/m  Constitutional: generally well-appearing Walks with a walker  She is clearly uncomfortable today seems more like she has very stiff back and sciatica.  Psychiatric: alert and oriented x3 Abdomen: soft,  mildly tender throughout nondistended, no obvious ascites, no peritoneal signs, normal bowel sounds No peripheral edema noted in lower extremities  Assessment and plan: 66 y.o. female with  right lower quadrant pain first she seems pretty uncomfortable in the room today. She is moving about in the chair similar to back pain. She says the pain is radiating down her right leg and she knows that it is her back. She sees pain clinic for that. She also says the right lower quadrant pain radiates down her back her right leg as well. I'm not sure this is  GI related which is definitely uncomfortable and she has minimal minimal to mild tenderness in her right lower quadrant today. Going to arrange for CT scan IV and oral contrast as well as basic set of labs including CBC and complete metabolic profile today.  Please see the  "Patient Instructions" section for addition details about the plan.  Rob Bunting, MD [facility] 12/01/2016, 2:04 PM

## 2016-12-01 NOTE — Patient Instructions (Addendum)
You will be set up for a CT scan of abdomen and pelvis with IV and oral contrast (for RLQ abdominal pain).  You have been scheduled for a CT scan of the abdomen and pelvis at [facility] ([address]---this is in the same building as Press photographer).   You are scheduled on 12/07/16 at 330 pm . You should arrive 15 minutes prior to your appointment time for registration. Please follow the written instructions below on the day of your exam:  WARNING: IF YOU ARE ALLERGIC TO IODINE/X-RAY DYE, PLEASE NOTIFY RADIOLOGY IMMEDIATELY AT [phone]! YOU WILL BE GIVEN A 13 HOUR PREMEDICATION PREP.  1) Do not eat or drink anything after 1130 am (4 hours prior to your test) 2) You have been given 2 bottles of oral contrast to drink. The solution may taste better if refrigerated, but do NOT add ice or any other liquid to this solution. Shake  well before drinking.    Drink 1 bottle of contrast @ 130 pm (2 hours prior to your exam)  Drink 1 bottle of contrast @ 230 pm (1 hour prior to your exam)  You may take any medications as prescribed with a small amount of water except for the following: Metformin, Glucophage, Glucovance, Avandamet, Riomet, Fortamet, Actoplus Met, Janumet, Glumetza or Metaglip. The above medications must be held the day of the exam AND 48 hours after the exam.  The purpose of you drinking the oral contrast is to aid in the visualization of your intestinal tract. The contrast solution may cause some diarrhea. Before your exam is started, you will be given a small amount of fluid to drink. Depending on your individual set of symptoms, you may also receive an intravenous injection of x-ray contrast/dye. Plan on being at [facility] for 30 minutes or longer, depending on the type of exam you are having performed.  This test typically takes 30-45 minutes to complete.  If you have any questions regarding your exam or if you need to reschedule, you may call the CT  department at [phone] between the hours of 8:00 am and 5:00 pm, Monday-Friday.  __________________________________________________________  Dennis Bast will have labs checked today in the basement lab.  Please head down after you check out with the front desk  (cbc, cmet).

## 2016-12-03 ENCOUNTER — Encounter: Payer: Self-pay | Admitting: Internal Medicine

## 2016-12-07 ENCOUNTER — Ambulatory Visit (INDEPENDENT_AMBULATORY_CARE_PROVIDER_SITE_OTHER)
Admission: RE | Admit: 2016-12-07 | Discharge: 2016-12-07 | Disposition: A | Discharge status: Home / Self Care | Payer: Medicaid Other | Source: Ambulatory Visit | Attending: Gastroenterology | Admitting: Gastroenterology

## 2016-12-07 ENCOUNTER — Inpatient Hospital Stay: Admission: RE | Admit: 2016-12-07 | Payer: Medicaid Other | Source: Ambulatory Visit

## 2016-12-07 DIAGNOSIS — R1031 Right lower quadrant pain: Secondary | ICD-10-CM

## 2016-12-07 MED ORDER — IOPAMIDOL (ISOVUE-300) INJECTION 61%
100.0000 mL | Freq: Once | INTRAVENOUS | Status: AC | PRN
  Administered 2016-12-07: 100 mL via INTRAVENOUS

## 2016-12-09 ENCOUNTER — Other Ambulatory Visit: Payer: Self-pay | Admitting: Gastroenterology

## 2016-12-11 ENCOUNTER — Ambulatory Visit (INDEPENDENT_AMBULATORY_CARE_PROVIDER_SITE_OTHER): Payer: Medicaid Other | Admitting: Internal Medicine

## 2016-12-11 ENCOUNTER — Ambulatory Visit: Payer: Medicaid Other | Admitting: Diagnostic Neuroimaging

## 2016-12-11 ENCOUNTER — Encounter: Payer: Self-pay | Admitting: Internal Medicine

## 2016-12-11 ENCOUNTER — Ambulatory Visit (HOSPITAL_COMMUNITY)
Admission: RE | Admit: 2016-12-11 | Discharge: 2016-12-11 | Disposition: A | Discharge status: Home / Self Care | Payer: Medicaid Other | Source: Ambulatory Visit | Attending: Family Medicine | Admitting: Family Medicine

## 2016-12-11 ENCOUNTER — Other Ambulatory Visit: Payer: Medicaid Other

## 2016-12-11 DIAGNOSIS — M5441 Lumbago with sciatica, right side: Secondary | ICD-10-CM | POA: Diagnosis present

## 2016-12-11 DIAGNOSIS — M25551 Pain in right hip: Secondary | ICD-10-CM | POA: Diagnosis present

## 2016-12-11 DIAGNOSIS — M4686 Other specified inflammatory spondylopathies, lumbar region: Secondary | ICD-10-CM | POA: Insufficient documentation

## 2016-12-11 DIAGNOSIS — G8929 Other chronic pain: Secondary | ICD-10-CM | POA: Diagnosis present

## 2016-12-11 DIAGNOSIS — M47898 Other spondylosis, sacral and sacrococcygeal region: Secondary | ICD-10-CM | POA: Insufficient documentation

## 2016-12-11 DIAGNOSIS — M549 Dorsalgia, unspecified: Secondary | ICD-10-CM | POA: Insufficient documentation

## 2016-12-11 NOTE — Progress Notes (Signed)
   [name] [name], MD Phone: [phone]  Reason For Visit: SDA for Hip and Back Pain   HIP/BACK PAIN  Patient significant right hip pain, has been going on for about 6 weeks. Patient previously thought it was lower pelvic pain however was seen by GI and myself and no abnormalities were noted. Patient does indicate having some slight lower back pain at times which radiates down into her leg. She also notes having hip pain which really radiates towards her inner thigh on the right side to knee. Patient denies any specific trauma history, has had a history of hip pain in the past.   History of cancer: None Weak immune system: None  History of IV drug use: None History of steroid use: None  Symptoms Incontinence of bowel or bladder:  None  Numbness of leg: None  Fever: None  Rest or Night pain: None  Weight Loss:  None   ROS see HPI Smoking Status noted.   Objective: BP 120/72   Pulse 65   Temp 98.4 F (36.9 C) (Oral)   Wt 219 lb (99.3 kg)   BMI 35.35 kg/m  Gen: NAD, alert, cooperative with exam Pulm: clear to auscultation bilaterally, no wheezes, rhonchi or rales GI: soft, non-tender, non-distended, bowel sounds present, no hepatomegaly, no splenomegaly Extremities: right hip tenderness, right paraspinal muscle tenderness along sacrum and lumbar area, ROM limited due to patient with pain, normal strength in lower extremities, normal sensation, 2+ patellar reflexes  Skin: dry, intact, no rashes or lesions   Assessment/Plan: See problem based a/p  Back pain 1. Chronic right-sided low back pain with right-sided sciatica - Ambulatory referral to Physical Therapy - DG Lumbar Spine Complete; Future - DG Sacrum/Coccyx; Future - DG HIP UNILAT WITH PELVIS 2-3 VIEWS RIGHT; Future - Follow up 2-3 weeks

## 2016-12-11 NOTE — Patient Instructions (Addendum)
You should here from [place] about the referral to physical therapy.  You can take tylenol for your pain. Please follow up in 2-3 weeks if no improvement  Back Exercises If you have pain in your back, do these exercises 2-3 times each day or as told by your doctor. When the pain goes away, do the exercises once each day, but repeat the steps more times for each exercise (do more repetitions). If you do not have pain in your back, do these exercises once each day or as told by your doctor. Exercises Single Knee to Chest   Do these steps 3-5 times in a row for each leg: 1. Lie on your back on a firm bed or the floor with your legs stretched out. 2. Bring one knee to your chest. 3. Hold your knee to your chest by grabbing your knee or thigh. 4. Pull on your knee until you feel a gentle stretch in your lower back. 5. Keep doing the stretch for 10-30 seconds. 6. Slowly let go of your leg and straighten it. Pelvic Tilt   Do these steps 5-10 times in a row: 1. Lie on your back on a firm bed or the floor with your legs stretched out. 2. Bend your knees so they point up to the ceiling. Your feet should be flat on the floor. 3. Tighten your lower belly (abdomen) muscles to press your lower back against the floor. This will make your tailbone point up to the ceiling instead of pointing down to your feet or the floor. 4. Stay in this position for 5-10 seconds while you gently tighten your muscles and breathe evenly. Cat-Cow   Do these steps until your lower back bends more easily: 1. Get on your hands and knees on a firm surface. Keep your hands under your shoulders, and keep your knees under your hips. You may put padding under your knees. 2. Let your head hang down, and make your tailbone point down to the floor so your lower back is round like the back of a cat. 3. Stay in this position for 5 seconds. 4. Slowly lift your head and make your tailbone point up to the ceiling so your back hangs low (sags)  like the back of a cow. 5. Stay in this position for 5 seconds. Press-Ups   Do these steps 5-10 times in a row: 1. Lie on your belly (face-down) on the floor. 2. Place your hands near your head, about shoulder-width apart. 3. While you keep your back relaxed and keep your hips on the floor, slowly straighten your arms to raise the top half of your body and lift your shoulders. Do not use your back muscles. To make yourself more comfortable, you may change where you place your hands. 4. Stay in this position for 5 seconds. 5. Slowly return to lying flat on the floor. Bridges   Do these steps 10 times in a row: 1. Lie on your back on a firm surface. 2. Bend your knees so they point up to the ceiling. Your feet should be flat on the floor. 3. Tighten your butt muscles and lift your butt off of the floor until your waist is almost as high as your knees. If you do not feel the muscles working in your butt and the back of your thighs, slide your feet 1-2 inches farther away from your butt. 4. Stay in this position for 3-5 seconds. 5. Slowly lower your butt to the floor, and let  your butt muscles relax. If this exercise is too easy, try doing it with your arms crossed over your chest. Belly Crunches   Do these steps 5-10 times in a row: 1. Lie on your back on a firm bed or the floor with your legs stretched out. 2. Bend your knees so they point up to the ceiling. Your feet should be flat on the floor. 3. Cross your arms over your chest. 4. Tip your chin a little bit toward your chest but do not bend your neck. 5. Tighten your belly muscles and slowly raise your chest just enough to lift your shoulder blades a tiny bit off of the floor. 6. Slowly lower your chest and your head to the floor. Back Lifts  Do these steps 5-10 times in a row: 1. Lie on your belly (face-down) with your arms at your sides, and rest your forehead on the floor. 2. Tighten the muscles in your legs and your  butt. 3. Slowly lift your chest off of the floor while you keep your hips on the floor. Keep the back of your head in line with the curve in your back. Look at the floor while you do this. 4. Stay in this position for 3-5 seconds. 5. Slowly lower your chest and your face to the floor. Contact a doctor if:  Your back pain gets a lot worse when you do an exercise.  Your back pain does not lessen 2 hours after you exercise. If you have any of these problems, stop doing the exercises. Do not do them again unless your doctor says it is okay. Get help right away if:  You have sudden, very bad back pain. If this happens, stop doing the exercises. Do not do them again unless your doctor says it is okay. This information is not intended to replace advice given to you by your health care provider. Make sure you discuss any questions you have with your health care provider. Document Released: 09/05/2010 Document Revised: 01/09/2016 Document Reviewed: 09/27/2014 Elsevier Interactive Patient Education  2017 [employer].

## 2016-12-15 NOTE — Assessment & Plan Note (Signed)
1. Chronic right-sided low back pain with right-sided sciatica - Ambulatory referral to Physical Therapy - DG Lumbar Spine Complete; Future - DG Sacrum/Coccyx; Future - DG HIP UNILAT WITH PELVIS 2-3 VIEWS RIGHT; Future - Follow up 2-3 weeks

## 2016-12-16 ENCOUNTER — Ambulatory Visit: Payer: Medicaid Other | Admitting: Physical Therapy

## 2016-12-17 ENCOUNTER — Encounter: Payer: Self-pay | Admitting: Internal Medicine

## 2016-12-17 NOTE — Progress Notes (Signed)
Called patient to let her know the results of her imaging. She is seeing physical therapy on Monday

## 2016-12-21 ENCOUNTER — Encounter: Payer: Self-pay | Admitting: Diagnostic Neuroimaging

## 2016-12-21 ENCOUNTER — Ambulatory Visit (INDEPENDENT_AMBULATORY_CARE_PROVIDER_SITE_OTHER): Payer: Medicaid Other | Admitting: Diagnostic Neuroimaging

## 2016-12-21 ENCOUNTER — Ambulatory Visit: Payer: Medicaid Other | Attending: Family Medicine | Admitting: Physical Therapy

## 2016-12-21 ENCOUNTER — Encounter: Payer: Self-pay | Admitting: Physical Therapy

## 2016-12-21 VITALS — BP 136/79 | HR 57 | Wt 217.0 lb

## 2016-12-21 DIAGNOSIS — M5441 Lumbago with sciatica, right side: Secondary | ICD-10-CM | POA: Diagnosis not present

## 2016-12-21 DIAGNOSIS — R569 Unspecified convulsions: Secondary | ICD-10-CM

## 2016-12-21 DIAGNOSIS — R55 Syncope and collapse: Secondary | ICD-10-CM | POA: Diagnosis not present

## 2016-12-21 DIAGNOSIS — M62830 Muscle spasm of back: Secondary | ICD-10-CM | POA: Insufficient documentation

## 2016-12-21 DIAGNOSIS — G8929 Other chronic pain: Secondary | ICD-10-CM | POA: Diagnosis present

## 2016-12-21 DIAGNOSIS — M6281 Muscle weakness (generalized): Secondary | ICD-10-CM | POA: Diagnosis present

## 2016-12-21 NOTE — Progress Notes (Signed)
PATIENT: [name] DOB: 1951-07-16  HISTORY FROM: patient REASON FOR VISIT: follow up   Chief Complaint  Patient presents with  . Partial idiopathic epilepsy    rm 6, "no seizure activity"  . Follow-up    6 month    HISTORY OF PRESENT ILLNESS:  UPDATE 12/21/16: Since last visit, had [facility] eval and VEEG, and was diagnosed with non-epileptic spells (likely patient having syncope / pre syncope spells instead) and has been off levetiracetam. Last syncope attack was in April 2018.   UPDATE 06/12/16 (VRP): Since last visit, has been in hospital x 3 in the last year. Went to hospital in Aug 2017 for syncope/seizure, and LEV was increase to 500mg  in AM and 2000mg  in PM. Then another syncope/seizure in Sept 2017, and LEV was kept the same. Also with intermittent "neck jerking" without loss of consciousness.  UPDATE 06/11/15 (VRP): No seizures since May 2015. Since last visit, was doing well until Jan 2016, then had hypotension, syncope, bradycardia, and 6 day admission.  Also had multiple ER evaluations for chest pain and other issues.   UPDATE 04/30/14 (VRP): Since last visit, was doing well until May 2015 (had generalized convulsive seizure) then 1 week last went to hospital and dx'd with afib. Now doing better. Not driving.   UPDATE 10/27/13 (LL): Patient returns for followup, last visit was 04/12/13.  She reports that she has not been feeling so well in the past 6 months.  She unknowingly had not been getting refills on her Leviteracetam for 2 months and started having more seizures.  She then got a refill and has been back on Leviteracetam for a month, but still reports 3 seizures during that time, mostly during the night, when she wakes up after having been incontinent of bladder and bowel and with sore muscles.  She has other times during the day when she feels that she is having them in which she feels confused and notices she has lost a few minutes, but these are unwitnessed.  She has  been titrated up on her Lyrica to 100 mg TID, still has neuropathy pain, but states she is very sleepy most of the time.  UPDATE 04/12/13 (LL): Patient returns for followup visit since last visit on 10/10/2012. She states that she has not had any more seizures or syncopal episodes since her last visit. She continues to take Keppra 500 mg twice a day with no known side effects. She reports that in earlier in the year she was placed on Neurontin 600 mg 3 times a day by another doctor and felt very badly. She reports having visual hallucinations, nausea, excessive diaphoresis. She had several trips to the emergency room this year for heart rate and blood pressure issues, chest pain, nausea and vomiting, pyelonephritis, and abdominal pain. She has been evaluated by GI, and [facility]. She was taken off Neurontin which was replaced with Lyrica 50 mg 3 times a day by her pain management M.D. Dr. Kumar 2 months ago. Since this time she reports that she is doing much better. She has no complaints today.   PRIOR HPI 10/10/12 (VRP): 66 year old in female here for evaluation of seizure disorder. Patient had first seizure of life in the fifth grade. She was diagnosed with seizures and treated with phenobarbital. She had generalized convulsions, tongue biting and incontinence. She had seizures until the ninth grade and then to stop. At some point she was taken off phenobarbital because she no longer had seizures. 2012  patient began to have seizures again. She was having intermittent episodes of staring, being in a daze, followed by shaking, tongue biting, vomiting and incontinence. Patient had an episode last night as well.    REVIEW OF SYSTEMS: Full 14 system review of systems performed and negative except: passing out dizziness headache confusion chills incontinence.   ALLERGIES: Allergies  Allergen Reactions  . Prednisone Other (See Comments)    SENT INTO AFIB   . Amitriptyline Other (See Comments)     Disoriented.  . Hydromorphone Hcl Other (See Comments)    blood pressure drops.    HOME MEDICATIONS: Outpatient Medications Prior to Visit  Medication Sig Dispense Refill  . atorvastatin (LIPITOR) 80 MG tablet Take 80 mg by mouth at bedtime.  0  . calcium carbonate (OS-CAL) 600 MG TABS tablet Take 600 mg by mouth daily.    . cholecalciferol (VITAMIN D) 1000 UNITS tablet Take 1,000 Units by mouth 2 (two) times daily.     . Cranberry 500 MG CAPS Take 500 mg by mouth 2 (two) times daily.    Marland Kitchen dofetilide ([name]) 250 MCG capsule take 1 capsule by mouth twice a day 180 capsule 0  . DULERA 100-5 MCG/ACT AERO inhale 2 puffs INTO THE LUNGS every 12 hours 13 g 11  . DULoxetine (CYMBALTA) 60 MG capsule Take 60 mg by mouth at bedtime.     Marland Kitchen EVZIO 0.4 MG/0.4ML SOAJ Inject 0.4 mLs as directed daily as needed (over dose).   0  . fluticasone (FLONASE) 50 MCG/ACT nasal spray instill 2 sprays into each nostril once daily 16 g 6  . Insulin Glargine (LANTUS SOLOSTAR) 100 UNIT/ML Solostar Pen Inject 30 Units into the skin at bedtime. (Patient taking differently: Inject 40 Units into the skin at bedtime. )    . loratadine (CLARITIN) 10 MG tablet take 1 tablet by mouth once daily 30 tablet 1  . magnesium oxide (MAG-OX) 400 MG tablet take 1 tablet by mouth once daily 30 tablet 11  . metoprolol tartrate (LOPRESSOR) 25 MG tablet Take 1 tablet (25 mg total) by mouth 2 (two) times daily. 180 tablet 3  . NEXIUM 40 MG capsule take 1 capsule by mouth twice a day BEFORE A MEAL 60 capsule 6  . nitroGLYCERIN (NITROSTAT) 0.4 MG SL tablet Place 1 tablet (0.4 mg total) under the tongue every 5 (five) minutes as needed for chest pain (do nto exceed 3 doses). 25 tablet 2  . NOVOLOG FLEXPEN 100 UNIT/ML FlexPen Inject 18-22 Units as directed 2 (two) times daily. Per sliding scale  0  . ondansetron (ZOFRAN ODT) 4 MG disintegrating tablet Take 1 tablet (4 mg total) by mouth every 8 (eight) hours as needed for nausea or vomiting. 10  tablet 0  . oxyCODONE-acetaminophen (PERCOCET) 10-325 MG tablet Take 1 tablet by mouth 5 (five) times daily. (scheduled)     . Polyvinyl Alcohol-Povidone (REFRESH OP) Place 1 drop into both eyes as needed (for dry eyes).    . potassium chloride (K-DUR) 10 MEQ tablet take 1 tablet by mouth once daily 90 tablet 0  . pregabalin (LYRICA) 100 MG capsule Take 100 mg by mouth 3 (three) times daily.    Marland Kitchen PROAIR HFA 108 (90 Base) MCG/ACT inhaler inhale 2 puffs every 6 hours if needed for wheezing OR SHORTNESS OF BREATH. 8.5 g 1  . vitamin C (ASCORBIC ACID) 500 MG tablet Take 500 mg by mouth 2 (two) times daily.    . VOLTAREN 1 % GEL APPLY  2 INCHES TO AFFECTED AREA four times a day 500 g 3  . XARELTO 20 MG TABS tablet take 1 tablet by mouth once daily with SUPPER (Patient taking differently: TAKE 20 MG BY MOUTH DAILY WITH SUPPER) 30 tablet 11   No facility-administered medications prior to visit.      PHYSICAL EXAM  Vitals:   12/21/16 1038  BP: 136/79  Pulse: (!) 57  Weight: 217 lb (98.4 kg)   Body mass index is 35.02 kg/m.  Physical Exam  General:  in no acute distress. Well developed and groomed.  Neck: Neck is supple.  Cardiovascular: No carotid artery bruits. Heart is regular rate and rhythm with no murmurs.   Neurologic Exam  Mental Status: Patient is awake, but drowsy.  Language is fluent and comprehension intact.  Cranial Nerves: Pupils are equal and reactive to light. Visual fields are full to confrontation. Conjugate eye movements are full and symmetric. Facial sensation and strength are symmetric. Hearing is intact. Palate elevated symmetrically and uvula is midline. Shoulder shrug is symmetric. Tongue is midline.  Motor: Normal bulk and tone. Full strength in the upper and lower extremities. No pronator drift.  Sensory: DECR IN BLE. Coordination: No ataxia or dysmetria on finger-nose or rapid alternating movement testing.  Gait and Station: ANTALGIC, UNSTEADY GAIT. USING  ROLLATOR Reflexes: Deep tendon reflexes in the upper and lower extremity are TRACE and symmetric.    DIAGNOSTICS  Lab Results  Component Value Date   WBC 3.7 (L) 12/01/2016   HGB 12.5 12/01/2016   HCT 38.1 12/01/2016   MCV 90.2 12/01/2016   PLT 174.0 12/01/2016     Chemistry      Component Value Date/Time   NA 138 12/01/2016 1425   K 4.0 12/01/2016 1425   CL 103 12/01/2016 1425   CO2 28 12/01/2016 1425   BUN 9 12/01/2016 1425   CREATININE 1.19 12/01/2016 1425   CREATININE 1.23 (H) 10/21/2016 1133      Component Value Date/Time   CALCIUM 9.2 12/01/2016 1425   ALKPHOS 100 12/01/2016 1425   AST 18 12/01/2016 1425   ALT 16 12/01/2016 1425   BILITOT 0.6 12/01/2016 1425      10/17/12 MRI BRAIN  - normal   05/13/16 MRI brain - Stable, normal noncontrast MRI head for age.    ASSESSMENT AND PLAN  66 y.o. right-handed female with multiple medical problems seen in our office for recurrent passing out spells, likely recurrent syncope / presyncope. [facility].    Dx:  Syncope, unspecified syncope type  Convulsions, unspecified convulsion type (HCC)    PLAN:  I spent 15 minutes of face to face time with patient. Greater than 50% of time was spent in counseling and coordination of care with patient. In summary we discussed:  - stay off levetiracetam  - no driving due to recurrent seizure and syncope - follow up with PCP and cardiology  Return if symptoms worsen or fail to improve, for return to PCP and cardiology.    Suanne MarkerVIKRAM R. PENUMALLI, MD 12/21/2016, 11:17 AM Certified in Neurology, Neurophysiology and Neuroimaging  [facility] [address], [address] [place], [place] [address] [phone]

## 2016-12-22 NOTE — Therapy (Signed)
[name] [address] [place], [place], [address] Phone: [phone]   Fax:  [phone]  Physical Therapy Evaluation  Patient Details  Name: [name] MRN: [id number] Date of Birth: 07-02-1951 Referring Provider: Dr Toni Bon  Encounter Date: 12/21/2016      PT End of Session - 12/22/16 0808    Visit Number 1   Number of Visits 1   Date for PT Re-Evaluation 12/21/16   Authorization Type medicaid eval    PT Start Time 1330   PT Stop Time 1415   PT Time Calculation (min) 45 min   Activity Tolerance Patient tolerated treatment well   Behavior During Therapy [name] LP for tasks assessed/performed      Past Medical History:  Diagnosis Date  . Anxiety   . Arthritis    "knees, hands, ankles, feet" (05/12/2016)  . Asthma    Dr. Sherene [name]  . Atrial fibrillation (HCC)   . Bipolar disorder (HCC)   . Chronic back pain    "lower and middle" (05/12/2016)  . CKD (chronic kidney disease), stage III   . Colon polyps   . Depression   . Diverticulosis   . Epilepsy (HCC)   . Essential hypertension   . Fibromyalgia   . Gallstones   . GERD (gastroesophageal reflux disease)   . H/O hiatal hernia   . Hyperlipidemia   . IBS (irritable bowel syndrome)   . Kidney stones    "passed them all" (05/12/2016)  . Migraine    05/12/2016 "daily since Sunday; they had calmed to once q 6 months or so"  . Paroxysmal A-fib (HCC)    a. On [name]. b. Recurrence 08/2014 in setting of GI illness.  . Peripheral neuropathy   . Sarcoidosis of lung (HCC)    Dr. Sherene [name]  . Seizures (HCC)    "epileptic; pretty regular recently" (05/12/2016)  . Type II diabetes mellitus (HCC)     Past Surgical History:  Procedure Laterality Date  . ABDOMINAL HYSTERECTOMY  1995  . ANTERIOR CERVICAL DECOMP/DISCECTOMY FUSION  07/11/2012   Procedure: ANTERIOR CERVICAL DECOMPRESSION/DISCECTOMY FUSION 1 LEVEL;  Surgeon: Toni Loron, MD;  Location: [facility];   Service: Neurosurgery;  Laterality: N/A;  Cervical Five-Six Anterior Cervical Decompression with Fusion Interbody Prothesis Plating and Bonegraft  . CARDIAC CATHETERIZATION  2012   a. Normal coronaries 2012. b. Normal nuc 09/2014.  Marland Kitchen CHOLECYSTECTOMY OPEN  1976  . CLOSED REDUCTION ANKLE FRACTURE Left 10/2006   "got steel rod in my leg; and screws"  . COLON SURGERY    . DILATION AND CURETTAGE OF UTERUS    . ESOPHAGEAL MANOMETRY  03/21/2012   Procedure: ESOPHAGEAL MANOMETRY (EM);  Surgeon: Toni Fee, MD;  Location: [facility];  Service: Endoscopy;  Laterality: N/A;  . FRACTURE SURGERY    . NEUROPLASTY / TRANSPOSITION MEDIAN NERVE AT CARPAL TUNNEL Left 2004  . NEUROPLASTY / TRANSPOSITION MEDIAN NERVE AT CARPAL TUNNEL Right 2002  . 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 SALPINGOOPHORECTOMY Bilateral 2000  . TUBAL LIGATION  1982    There were no vitals filed for this visit.       Subjective Assessment - 12/21/16 1330    Subjective Patient reports chronic back pain for several years. It recently has become worse. She recently began having pain when she is sitting. Her pain is mostly on the right side. She is using a Retail banker. She reprots sitting, standing, and walking. She is on blood thinners  for a-fib. She also has frequent syncope 2nd to her blood pressure.    Limitations Standing;Walking;House hold activities;Sitting   How long can you sit comfortably? < 10 minutes beofre the pain increases    How long can you stand comfortably? < 5 minutes before the pain increase    How long can you walk comfortably? limited household distances with pain   Diagnostic tests X-ray: degeneration from L3 to S1   Patient Stated Goals To have less pain.    Currently in Pain? Yes   Pain Score 6    Pain Location Back   Pain Orientation Right   Pain Descriptors / Indicators Aching   Pain Type Chronic pain   Pain Onset More than a month ago   Pain Frequency Constant   Aggravating Factors   standing, walking, sitting    Pain Relieving Factors rest, changing position, oxycodone    Effect of Pain on Daily Activities diffciulty perfroming daily activity    Multiple Pain Sites No            [facility] PT Assessment - 12/22/16 0001      Assessment   Medical Diagnosis Low back pain    Referring Provider Dr [name]   Onset Date/Surgical Date --  For several years    Hand Dominance Right   Next MD Visit 6 weeks    Prior Therapy Therap yin the past for her back      Precautions   Precautions None     Restrictions   Weight Bearing Restrictions No   Other Position/Activity Restrictions n     Balance Screen   Has the patient fallen in the past 6 months Yes   How many times? 2   Has the patient had a decrease in activity level because of a fear of falling?  Yes   Is the patient reluctant to leave their home because of a fear of falling?  No     Home Environment   Additional Comments 4 steps into the house. Patients grandchildren help her.      Prior Function   Level of Independence Needs assistance with ADLs   Vocation Retired   Leisure Walking, playing with the grandkids      Cognition   Overall Cognitive Status Within Functional Limits for tasks assessed   Attention Focused   Focused Attention Appears intact   Memory Appears intact   Awareness Appears intact   Problem Solving Appears intact     Observation/Other Assessments   Observations scar on the left ankle from ORIF    Focus on Therapeutic Outcomes (FOTO)  MCD 1 visit      Observation/Other Assessments-Edema    Edema --  edema in the medial right knee      Sensation   Additional Comments radiating pain into the right knee and at times the pain can go into the knee     Coordination   Gross Motor Movements are Fluid and Coordinated Yes   Fine Motor Movements are Fluid and Coordinated Yes     ROM / Strength   AROM / PROM / Strength AROM;PROM;Strength     AROM   AROM Assessment Site  Lumbar   Lumbar Flexion 75% limited    Lumbar Extension 50% limited    Lumbar - Right Side Bend 50% limited    Lumbar - Left Side Bend 50% limited    Lumbar - Right Rotation 25% limitaion    Lumbar - Left Rotation 25% limitation  PROM   Overall PROM Comments left hip ROM WNL; Pain with passive IR and ER; pain in lumbar spine and pain in the anterior hip     PROM Assessment Site Hip   Right/Left Hip Right     Strength   Strength Assessment Site Hip   Right/Left Hip Right;Left   Right Hip Flexion 3/5   Right Hip ABduction 3/5   Right Hip ADduction 4+/5   Left Hip Flexion 4+/5   Left Hip ABduction 4+/5   Left Hip ADduction 4+/5     Right Hip   Right Hip Flexion 74  significant pain with hip flexion      Flexibility   Soft Tissue Assessment /Muscle Length yes   Hamstrings 90/90: L 35; 40      Palpation   Palpation comment tedneress to plapation on the right      Transfers   Comments Requires bilateral upper extremitys required to transfer from sit to stand.      Ambulation/Gait   Gait Comments walks with rolator; decreased weight bearing on the right; decreased hip flexion on the right;                    [facility] Adult PT Treatment/Exercise - 12/22/16 0001      Exercises   Exercises --  see patient instructions                 PT Education - 12/22/16 0807    Education provided Yes   Education Details extensive education on HEP and activity progression.    Person(s) Educated Patient   Methods Explanation;Demonstration;Tactile cues;Verbal cues   Comprehension Verbalized understanding;Returned demonstration;Verbal cues required;Tactile cues required          PT Short Term Goals - 12/22/16 0816      PT SHORT TERM GOAL #1   Title Patient will be independent with basic HEP    Time 1   Period Days   Status Achieved     PT SHORT TERM GOAL #2   Title Patient will verbalize an understanding of activity production    Time 1   Period Weeks    Status New                  Plan - 12/22/16 0809    Clinical Impression Statement Patient is a 66 year old female who presents with right sided lower back pand hip pain. Signs and symptoms are consitent with lumbar DDD and hip OA. She also likely has right sided knee OA. She has significant limitations in hip flexion ER, and IR. She has weakness in her left hip flexors. She would benefit from skilled therapy to adress the above dercitis. She has to walk with a rolator walker to prevent falls. She will only get 1 visit at this time 2nd to mediciad restrictions.     Rehab Potential Fair   Clinical Impairments Affecting Rehab Potential limited visits per medicaid    PT Frequency 2x / week   PT Duration 8 weeks   PT Treatment/Interventions ADLs/Self Care Home Management;Cryotherapy;Electrical Stimulation;Stair training;Gait training;Functional mobility training;Ultrasound;Moist Heat;Therapeutic activities;Therapeutic exercise;Patient/family education;Other (comment);Manual techniques   PT Next Visit Plan 1x visit    PT Home Exercise Plan none    Recommended Other Services none    Consulted and Agree with Plan of Care Patient      Patient will benefit from skilled therapeutic intervention in order to improve the following deficits and impairments:  Decreased activity tolerance,  Abnormal gait, Decreased range of motion, Difficulty walking, Pain, Decreased strength, Decreased mobility, Decreased endurance, Increased muscle spasms  Visit Diagnosis: Chronic right-sided low back pain with right-sided sciatica  Muscle spasm of back  Muscle weakness (generalized)     Problem List Patient Active Problem List   Diagnosis Date Noted  . Back pain 12/11/2016  . Pelvic pain 11/25/2016  . Persistent atrial fibrillation (HCC)   . Irritable bowel syndrome 03/03/2016  . Chronic leukopenia 09/13/2015  . PAF (paroxysmal atrial fibrillation) (HCC) 08/22/2015  . Morbid obesity (HCC)  07/11/2015  . Bipolar I disorder (HCC) 05/22/2015  . Chronic kidney disease (CKD), stage III (moderate) 05/14/2015  . Seizure disorder (HCC)   . History of diverticulitis   . Syncope 06/29/2012  . Orthostatic hypotension 06/29/2012  . History of colonic polyps 09/01/2011  . Sarcoid (HCC) 09/01/2011  . GERD (gastroesophageal reflux disease) 09/01/2011  . 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 Diabetes mellitus type 2, uncontrolled, with complications (HCC) 11/19/2008  . Hyperlipidemia 11/19/2008  . Essential hypertension 11/19/2008    Dessie Coma PT DPT  12/22/2016, 8:52 AM  [facility] [address] [place], [place], [address] Phone: [phone]   Fax:  [phone]  Name: [name] MRN: [id number] Date of Birth: October 19, 1950

## 2016-12-30 ENCOUNTER — Ambulatory Visit (HOSPITAL_BASED_OUTPATIENT_CLINIC_OR_DEPARTMENT_OTHER): Payer: Medicaid Other | Attending: Nurse Practitioner | Admitting: Internal Medicine

## 2016-12-30 DIAGNOSIS — R5383 Other fatigue: Secondary | ICD-10-CM | POA: Diagnosis not present

## 2016-12-30 DIAGNOSIS — I10 Essential (primary) hypertension: Secondary | ICD-10-CM | POA: Insufficient documentation

## 2016-12-30 DIAGNOSIS — E669 Obesity, unspecified: Secondary | ICD-10-CM | POA: Insufficient documentation

## 2016-12-30 DIAGNOSIS — R0683 Snoring: Secondary | ICD-10-CM | POA: Diagnosis not present

## 2016-12-30 DIAGNOSIS — I4891 Unspecified atrial fibrillation: Secondary | ICD-10-CM | POA: Insufficient documentation

## 2016-12-30 DIAGNOSIS — Z6834 Body mass index (BMI) 34.0-34.9, adult: Secondary | ICD-10-CM | POA: Insufficient documentation

## 2016-12-30 DIAGNOSIS — R0681 Apnea, not elsewhere classified: Secondary | ICD-10-CM | POA: Diagnosis not present

## 2016-12-30 DIAGNOSIS — Z79899 Other long term (current) drug therapy: Secondary | ICD-10-CM | POA: Diagnosis not present

## 2016-12-30 DIAGNOSIS — G4700 Insomnia, unspecified: Secondary | ICD-10-CM | POA: Diagnosis not present

## 2016-12-30 DIAGNOSIS — E119 Type 2 diabetes mellitus without complications: Secondary | ICD-10-CM | POA: Insufficient documentation

## 2016-12-30 DIAGNOSIS — R454 Irritability and anger: Secondary | ICD-10-CM

## 2016-12-30 DIAGNOSIS — G4710 Hypersomnia, unspecified: Secondary | ICD-10-CM

## 2016-12-30 DIAGNOSIS — G4730 Sleep apnea, unspecified: Secondary | ICD-10-CM

## 2017-01-10 NOTE — Procedures (Signed)
  Patient Name: [name], [name] Study Date: 12/30/2016 Gender: Female [name].O.B: 07/14/1951 Age (years): 65 Referring Provider: Lavenia AtlasMarilyn Foster Height (inches): 66 Interpreting Physician: Toni Duhamellinton Arrie Borrelli MD, ABSM Weight (lbs): 210 RPSGT: Ulyess MortSpruill, Vicki BMI: 34 MRN: [id number] Neck Size: 16.00 CLINICAL INFORMATION Sleep Study Type: NPSG  Indication for sleep study: Diabetes, Fatigue, Hypertension, Obesity, Snoring  Epworth Sleepiness Score: 16   Most recent polysomnogram dated 06/28/2015 revealed an AHI of 0.0/h and RDI of 0.0/h. SLEEP STUDY TECHNIQUE As per the AASM Manual for the Scoring of Sleep and Associated Events v2.3 (April 2016) with a hypopnea requiring 4% desaturations.  The channels recorded and monitored were frontal, central and occipital EEG, electrooculogram (EOG), submentalis EMG (chin), nasal and oral airflow, thoracic and abdominal wall motion, anterior tibialis EMG, snore microphone, electrocardiogram, and pulse oximetry.  MEDICATIONS Medications self-administered by patient taken the night of the study : LIPITOR, [name], CYMBALTA, METOPROLOL TARTRATE, OXYCODONE-ACETAMINOPHEN, LYRICA  SLEEP ARCHITECTURE The study was initiated at 9:54:53 PM and ended at 4:45:27 AM.  Sleep onset time was 18.8 minutes and the sleep efficiency was 71.9%. The total sleep time was 295.0 minutes.  Stage REM latency was 161.0 minutes.  The patient spent 13.05% of the night in stage N1 sleep, 74.24% in stage N2 sleep, 0.00% in stage N3 and 12.71% in REM.  Alpha intrusion was absent.  Supine sleep was 40.34%.  RESPIRATORY PARAMETERS The overall apnea/hypopnea index (AHI) was 0.6 per hour. There were 2 total apneas, including 1 obstructive, 1 central and 0 mixed apneas. There were 1 hypopneas and 23 RERAs.  The AHI during Stage REM sleep was 1.6 per hour.  AHI while supine was 0.0 per hour.  The mean oxygen saturation was 94.29%. The minimum SpO2 during sleep was  90.00%.  Moderate snoring was noted during this study.  CARDIAC DATA The 2 lead EKG demonstrated atrial fibrillation. The mean heart rate was 84.37 beats per minute. Other EKG findings include: PVCs.  LEG MOVEMENT DATA The total PLMS were 0 with a resulting PLMS index of 0.00. Associated arousal with leg movement index was 0.0 .  IMPRESSIONS - No significant obstructive sleep apnea occurred during this study (AHI = 0.6/h). - No significant central sleep apnea occurred during this study (CAI = 0.2/h). - The patient had minimal or no oxygen desaturation during the study (Min O2 = 90.00%) - The patient snored with Moderate snoring volume. - Sleep fragmented and restless with frequent brief awakenings, some sleep talking especially in REM. - EKG findings include atrial fibrillation. - Clinically significant periodic limb movements did not occur during sleep. No significant associated arousals.  DIAGNOSIS - Primary Snoring (786.09 [R06.83 ICD-10])  RECOMMENDATIONS - Be careful with alcohol, sedatives and other CNS depressants that may worsen sleep apnea and disrupt normal sleep architecture. - Sleep hygiene should be reviewed to assess factors that may improve sleep quality. - Weight management and regular exercise should be initiated or continued if appropriate.  [Electronically signed] 01/10/2017 10:00 AM  Toni Duhamellinton Josey Dettmann MD, ABSM Diplomate, American Board of Sleep Medicine   NPI: [id number]  [name],[name] Diplomate, American Board of Sleep Medicine  ELECTRONICALLY SIGNED ON:  01/10/2017, 10:01 AM [facility] PH: [phone]   FX: [phone] ACCREDITED BY THE AMERICAN ACADEMY OF SLEEP MEDICINE

## 2017-01-14 ENCOUNTER — Telehealth: Payer: Self-pay | Admitting: Cardiovascular Disease

## 2017-01-14 NOTE — Telephone Encounter (Signed)
3 days is sufficient

## 2017-01-14 NOTE — Telephone Encounter (Signed)
New Message   pt verbalized that she is calling for rn   She is a new pt at the pain clinic for pain injections and wants to speak to rn about it

## 2017-01-14 NOTE — Telephone Encounter (Signed)
Patient is going to be having a pain injection into her back. Patient was told she would need to hold her xarelto for one week. Patient did not think she should hold her xarelto that long and wanted Dr. Fabio BeringNishan's advisement. Will forward to Dr. Eden EmmsNishan.   Pain clinic number [phone] or [phone]  Fax number [phone]

## 2017-01-15 NOTE — Telephone Encounter (Signed)
Called patient about Dr. Eden EmmsNishan recommendations. Informed patient that holding her xarelto for 3 days is sufficient. Patient verbalized understanding.

## 2017-01-28 ENCOUNTER — Other Ambulatory Visit: Payer: Self-pay | Admitting: Internal Medicine

## 2017-02-02 ENCOUNTER — Other Ambulatory Visit: Payer: Self-pay | Admitting: Internal Medicine

## 2017-03-01 ENCOUNTER — Ambulatory Visit (INDEPENDENT_AMBULATORY_CARE_PROVIDER_SITE_OTHER): Payer: Medicare Other | Admitting: Internal Medicine

## 2017-03-01 ENCOUNTER — Ambulatory Visit (HOSPITAL_COMMUNITY)
Admission: RE | Admit: 2017-03-01 | Discharge: 2017-03-01 | Disposition: A | Discharge status: Home / Self Care | Payer: Medicare Other | Source: Ambulatory Visit | Attending: Family Medicine | Admitting: Family Medicine

## 2017-03-01 ENCOUNTER — Encounter: Payer: Self-pay | Admitting: Internal Medicine

## 2017-03-01 VITALS — BP 142/82 | HR 102 | Temp 98.4°F | Ht 66.0 in | Wt 216.0 lb

## 2017-03-01 DIAGNOSIS — E1165 Type 2 diabetes mellitus with hyperglycemia: Secondary | ICD-10-CM | POA: Diagnosis not present

## 2017-03-01 DIAGNOSIS — I4891 Unspecified atrial fibrillation: Secondary | ICD-10-CM | POA: Insufficient documentation

## 2017-03-01 DIAGNOSIS — I499 Cardiac arrhythmia, unspecified: Secondary | ICD-10-CM

## 2017-03-01 DIAGNOSIS — IMO0002 Reserved for concepts with insufficient information to code with codable children: Secondary | ICD-10-CM

## 2017-03-01 DIAGNOSIS — I10 Essential (primary) hypertension: Secondary | ICD-10-CM | POA: Diagnosis not present

## 2017-03-01 DIAGNOSIS — E118 Type 2 diabetes mellitus with unspecified complications: Secondary | ICD-10-CM | POA: Diagnosis not present

## 2017-03-01 DIAGNOSIS — E785 Hyperlipidemia, unspecified: Secondary | ICD-10-CM | POA: Diagnosis not present

## 2017-03-01 DIAGNOSIS — I480 Paroxysmal atrial fibrillation: Secondary | ICD-10-CM

## 2017-03-01 DIAGNOSIS — Z794 Long term (current) use of insulin: Secondary | ICD-10-CM

## 2017-03-01 MED ORDER — METOPROLOL TARTRATE 25 MG PO TABS: 50.0000 mg | ORAL_TABLET | Freq: Two times a day (BID) | ORAL | 3 refills | Status: DC

## 2017-03-01 MED ORDER — METOPROLOL TARTRATE 25 MG PO TABS: 25.0000 mg | ORAL_TABLET | Freq: Two times a day (BID) | ORAL | 3 refills | Status: DC

## 2017-03-01 NOTE — Assessment & Plan Note (Signed)
Well controlled  For better rate control increasing metoprolol

## 2017-03-01 NOTE — Patient Instructions (Addendum)
I want to increase your metoprolol to 50 mg BID, follow up with cardiology in the next couple of days. Please follow up with me in about 1 month to see how things are going for you in the mean time.

## 2017-03-01 NOTE — Assessment & Plan Note (Signed)
Well controlled  Needs lipid panel at next visit

## 2017-03-01 NOTE — Assessment & Plan Note (Addendum)
EKG positive for Afib, thought no symptoms per patient. Slightly tachycardic to 102  - Follow up with cardiology  - increased [place] to  50 mg BID - Check TSH given hx of endorsing feeling warmer than others - morning sweats

## 2017-03-01 NOTE — Assessment & Plan Note (Signed)
Follow-up by endocrinology. Recently increase in Lantus. Patient reporting 2 hypoglycemic episode of CBGs in the 70s. Has not discussed this with endocrinology. Recommended patient needs to decrease her insulin however as I am not primary on this issue will have her call endocrinology today for recommendations

## 2017-03-01 NOTE — Progress Notes (Signed)
   [facility] Noralee CharsAsiyah Irelynd Zumstein, MD Phone: [phone]  Reason For Visit:   # CHRONIC HTN: Reports been well controlled at home  Current Meds - continues to take the metropolol, no issues  Reports good compliance, took meds today. Tolerating well, w/o complaints. Lifestyle - planning to start silver sneakers  Denies CP, dyspnea, HA, edema Endorse slight dizziness and lightheadness at times - however nothing to compared  #HYPERLIPIDEMIA Disease Monitoring: See symptoms for Hypertension Medications: Compliance- pravastatin Right upper quadrant pain- none  Muscle aches- none   CHRONIC DM, Type 2: Follow with endocrinology - Last A1C 13 with CBGs. Endocrinology increased lantus as below. Patient also with a lot of stress recently from home issues which she believes is contributing to her higher blood sugars  CBGs:  CBGs 150 -169   Meds: Lantus 60 units and Novolog 34-38 units  Reports  good compliance. Tolerating well w/o side-effects Has had two lows in the 70s  Any hypoglycemia episodes: Denies palpations, Indicates shaking and trembling -  Denies polyuria, visual changes, numbness or tingling.  Chronic Afib - PAF vs Persistent Afib  - Has recently had a lot of family stress   -Saw cardiology in March with normal EKG in January 2018  - Does endorse morning sweats  - Denies any palpations, SOB, or weakness   Past Medical History Reviewed problem list.  Medications- reviewed and updated No additions to family history Social history- patient is a  Non- smoker  Objective: BP (!) 142/82   Pulse (!) 102   Temp 98.4 F (36.9 C) (Oral)   Ht 5\' 6"  (1.676 m)   Wt 216 lb (98 kg)   BMI 34.86 kg/m  Gen: NAD, alert, cooperative with exam Cardio: irregular rate, no murmurs appreciated Pulm: clear to auscultation bilaterally, no wheezes, rhonchi or rales Extremities: warm, well perfused, No edema  Assessment/Plan: See problem based a/p  PAF (paroxysmal  atrial fibrillation) (HCC) EKG positive for Afib, thought no symptoms per patient. Slightly tachycardic to 102  - Follow up with cardiology  - increased metropolis to  50 mg BID - Check TSH given hx of endorsing feeling warmer than others - morning sweats    Hyperlipidemia Well controlled  Needs lipid panel at next visit    Essential hypertension Well controlled  For better rate control increasing metoprolol   Diabetes mellitus type 2, uncontrolled, with complications Follow-up by endocrinology. Recently increase in Lantus. Patient reporting 2 hypoglycemic episode of CBGs in the 70s. Has not discussed this with endocrinology. Recommended patient needs to decrease her insulin however as I am not primary on this issue will have her call endocrinology today for recommendations

## 2017-03-02 LAB — TSH: TSH: 1.11 u[IU]/mL (ref 0.450–4.500)

## 2017-03-04 ENCOUNTER — Encounter: Payer: Self-pay | Admitting: Internal Medicine

## 2017-03-15 ENCOUNTER — Other Ambulatory Visit: Payer: Self-pay | Admitting: *Deleted

## 2017-03-15 MED ORDER — ONDANSETRON 4 MG PO TBDP: 4.0000 mg | ORAL_TABLET | Freq: Three times a day (TID) | ORAL | 0 refills | Status: DC | PRN

## 2017-03-31 ENCOUNTER — Encounter: Payer: Self-pay | Admitting: Nurse Practitioner

## 2017-04-01 ENCOUNTER — Ambulatory Visit (INDEPENDENT_AMBULATORY_CARE_PROVIDER_SITE_OTHER): Payer: Medicare Other | Admitting: Internal Medicine

## 2017-04-01 ENCOUNTER — Encounter: Payer: Self-pay | Admitting: Internal Medicine

## 2017-04-01 VITALS — BP 138/62 | HR 56 | Temp 97.7°F | Ht 66.0 in | Wt 213.2 lb

## 2017-04-01 DIAGNOSIS — E1165 Type 2 diabetes mellitus with hyperglycemia: Secondary | ICD-10-CM | POA: Diagnosis not present

## 2017-04-01 DIAGNOSIS — G8929 Other chronic pain: Secondary | ICD-10-CM

## 2017-04-01 DIAGNOSIS — I480 Paroxysmal atrial fibrillation: Secondary | ICD-10-CM | POA: Diagnosis not present

## 2017-04-01 DIAGNOSIS — E118 Type 2 diabetes mellitus with unspecified complications: Secondary | ICD-10-CM

## 2017-04-01 DIAGNOSIS — M5441 Lumbago with sciatica, right side: Secondary | ICD-10-CM

## 2017-04-01 DIAGNOSIS — Z794 Long term (current) use of insulin: Secondary | ICD-10-CM | POA: Diagnosis not present

## 2017-04-01 DIAGNOSIS — I10 Essential (primary) hypertension: Secondary | ICD-10-CM | POA: Diagnosis not present

## 2017-04-01 DIAGNOSIS — IMO0002 Reserved for concepts with insufficient information to code with codable children: Secondary | ICD-10-CM

## 2017-04-01 LAB — POCT GLYCOSYLATED HEMOGLOBIN (HGB A1C): Hemoglobin A1C: 10.8

## 2017-04-01 NOTE — Patient Instructions (Addendum)
I am not going to make any changes on your blood pressure at this point. I want you to follow up in 1-2 months for this issues. Make sure you are following up with cardiology as well

## 2017-04-01 NOTE — Progress Notes (Signed)
   [facility] Toni CharsAsiyah Janica Eldred, MD Phone: [phone]  Reason For Visit: F/U  # CHRONIC HTN: Current Meds - continue lopressor Reports good compliance, took meds today. Tolerating well, w/o complaints. Lifestyle -planned to get involved with Silver sneakers.  however has not recently as she had a lot of stress at home Denies chest pain, swelling   # Afib  - At last visit patient was seen for irregular heartbeat and tachycardia  - Increased her metoprolol, she feels like she no longer has palpitations since that visit - Follow ups with cardiology next week.  # Patient was seen back in April for lower back pain, however could not afford physical therapy at the time, Would like a referral now for her chronic back pain.    Past Medical History Reviewed problem list.  Medications- reviewed and updated No additions to family history Social history- patient is a non- smoker  Objective: BP 138/62   Pulse (!) 56   Temp 97.7 F (36.5 C) (Oral)   Ht 5\' 6"  (1.676 m)   Wt 213 lb 3.2 oz (96.7 kg)   SpO2 99%   BMI 34.41 kg/m  Gen: NAD, alert, cooperative with exam Cardio: regular rate and rhythm, S1S2 heard, no murmurs appreciated Pulm: clear to auscultation bilaterally, no wheezes, rhonchi or rales GI: soft, non-tender, non-distended, bowel sounds present, no hepatomegaly, no splenomegaly MSK: Normal gait and station Skin: dry, intact, no rashes or lesions Neuro: Strength and sensation grossly intact   Assessment/Plan: See problem based a/p  Essential hypertension No elevated blood pressure today  Continue increased dose of Lopressor   PAF (paroxysmal atrial fibrillation) (HCC) Last visit afib, now regular rate and rhythm  No issues with the increased dose of metoprolol  Follow up with cardiology  Follow up in 1 month   Back pain Chronic hx of lower back pain  Will place referral for physical therapy

## 2017-04-05 NOTE — Assessment & Plan Note (Addendum)
Last visit afib, now regular rate and rhythm  No issues with the increased dose of metoprolol  Follow up with cardiology  Follow up in 1 month

## 2017-04-05 NOTE — Assessment & Plan Note (Signed)
No elevated blood pressure today  Continue increased dose of Lopressor

## 2017-04-05 NOTE — Assessment & Plan Note (Signed)
Chronic hx of lower back pain  Will place referral for physical therapy

## 2017-04-16 ENCOUNTER — Other Ambulatory Visit: Payer: Self-pay | Admitting: Internal Medicine

## 2017-04-20 ENCOUNTER — Ambulatory Visit: Payer: Medicare Other | Attending: Family Medicine | Admitting: Physical Therapy

## 2017-04-20 ENCOUNTER — Encounter: Payer: Self-pay | Admitting: Nurse Practitioner

## 2017-04-20 ENCOUNTER — Other Ambulatory Visit: Payer: Self-pay | Admitting: Nurse Practitioner

## 2017-04-20 ENCOUNTER — Ambulatory Visit (INDEPENDENT_AMBULATORY_CARE_PROVIDER_SITE_OTHER): Payer: Medicare Other | Admitting: Nurse Practitioner

## 2017-04-20 VITALS — BP 130/80 | HR 113 | Ht 66.0 in | Wt 214.0 lb

## 2017-04-20 DIAGNOSIS — M791 Myalgia, unspecified site: Secondary | ICD-10-CM

## 2017-04-20 DIAGNOSIS — M6281 Muscle weakness (generalized): Secondary | ICD-10-CM

## 2017-04-20 DIAGNOSIS — I480 Paroxysmal atrial fibrillation: Secondary | ICD-10-CM | POA: Diagnosis not present

## 2017-04-20 DIAGNOSIS — Z79899 Other long term (current) drug therapy: Secondary | ICD-10-CM | POA: Diagnosis not present

## 2017-04-20 DIAGNOSIS — I10 Essential (primary) hypertension: Secondary | ICD-10-CM | POA: Diagnosis not present

## 2017-04-20 DIAGNOSIS — R293 Abnormal posture: Secondary | ICD-10-CM

## 2017-04-20 DIAGNOSIS — Z7901 Long term (current) use of anticoagulants: Secondary | ICD-10-CM | POA: Diagnosis not present

## 2017-04-20 DIAGNOSIS — G8929 Other chronic pain: Secondary | ICD-10-CM

## 2017-04-20 DIAGNOSIS — M5441 Lumbago with sciatica, right side: Secondary | ICD-10-CM | POA: Insufficient documentation

## 2017-04-20 MED ORDER — METOPROLOL TARTRATE 50 MG PO TABS: 75.0000 mg | ORAL_TABLET | Freq: Two times a day (BID) | ORAL | 3 refills | Status: DC

## 2017-04-20 NOTE — Progress Notes (Signed)
CARDIOLOGY OFFICE NOTE  Date:  04/20/2017    [name] Date of Birth: Oct 21, 1950 Medical Record #[id number]  PCP:  [name], MD  Cardiologist:  [name]  Chief Complaint  Patient presents with  . Atrial Fibrillation    Follow up visit - seen for Dr. [name]    History of Present Illness: [name] is a 66 y.o. female who presents today for a follow up visit. Seen for Dr. [name].   She has a history of HTN and PAF. She is on [name] & Xarelto. Other issues include CKD, noted normal coronaries from 2012 (normal nuc 09/2014), sarcoidosis of lung, asthma, HTN, HLD, DM, GERD, migraine, seizures, & peripheral neuropathy.   Had recurrent AF with RVR in January of 2017 in the setting of a URI.  She had spontaneous conversion back to NSR on IV Cardizem. She was transitioned to 120 mg of Diltiazem CD and prescribed Mag Ox at time of discharge.   Last seen back in March of 2018 by Dr. [name]. Noted more issues related to her seizures at that time.   Comes in today. Here alone. Saw her PCP back in July - was told to come here - she was in AF - noted HR was about 100 at that visit. Metoprolol was increased to 50 mg BID at that time. Then called here to get a visit as recommended - and was not able to get scheduled until today. She remains on her Xarelto - no missed doses. Remains on her [name] as well. BP is ok. She feels tired, sluggish and dizzy. Has been presyncopal. Some swelling in her feet. Feels some palpitations. She has been trying to work on her weight. She has been walking more - down 4 pounds since last visit here.   Past Medical History:  Diagnosis Date  . Anxiety   . Arthritis    "knees, hands, ankles, feet" (05/12/2016)  . Asthma    Dr. [name]  . Atrial fibrillation (HCC)   . Bipolar disorder (HCC)   . Chronic back pain    "lower and middle" (05/12/2016)  . CKD (chronic kidney disease), stage III   . Colon polyps   . Depression   . Diverticulosis     . Epilepsy (HCC)   . Essential hypertension   . Fibromyalgia   . Gallstones   . GERD (gastroesophageal reflux disease)   . H/O hiatal hernia   . Hyperlipidemia   . IBS (irritable bowel syndrome)   . Kidney stones    "passed them all" (05/12/2016)  . Migraine    05/12/2016 "daily since Sunday; they had calmed to once q 6 months or so"  . Paroxysmal A-fib (HCC)    a. On [name]. b. Recurrence 08/2014 in setting of GI illness.  . Peripheral neuropathy   . Sarcoidosis of lung (HCC)    Dr. [name]  . Seizures (HCC)    "epileptic; pretty regular recently" (05/12/2016)  . Type II diabetes mellitus (HCC)     Past Surgical History:  Procedure Laterality Date  . ABDOMINAL HYSTERECTOMY  1995  . ANTERIOR CERVICAL DECOMP/DISCECTOMY FUSION  07/11/2012   Procedure: ANTERIOR CERVICAL DECOMPRESSION/DISCECTOMY FUSION 1 LEVEL;  Surgeon: [name], MD;  Location: [facility];  Service: Neurosurgery;  Laterality: N/A;  Cervical Five-Six Anterior Cervical Decompression with Fusion Interbody Prothesis Plating and Bonegraft  . CARDIAC CATHETERIZATION  2012   a. Normal coronaries 2012. b. Normal nuc 09/2014.  Marland Kitchen CHOLECYSTECTOMY OPEN  1976  . CLOSED REDUCTION ANKLE FRACTURE Left 10/2006   "got steel rod in my leg; and screws"  . COLON SURGERY    . DILATION AND CURETTAGE OF UTERUS    . ESOPHAGEAL MANOMETRY  03/21/2012   Procedure: ESOPHAGEAL MANOMETRY (EM);  Surgeon: Rachael Feeaniel P Jacobs, MD;  Location: [facility];  Service: Endoscopy;  Laterality: N/A;  . FRACTURE SURGERY    . NEUROPLASTY / TRANSPOSITION MEDIAN NERVE AT CARPAL TUNNEL Left 2004  . NEUROPLASTY / TRANSPOSITION MEDIAN NERVE AT CARPAL TUNNEL Right 2002  . 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 SALPINGOOPHORECTOMY Bilateral 2000  . TUBAL LIGATION  1982     Medications: Current Meds  Medication Sig  . atorvastatin (LIPITOR) 80 MG tablet Take 80 mg by mouth at bedtime.  . calcium carbonate (OS-CAL) 600 MG TABS tablet Take 600 mg by  mouth daily.  . cholecalciferol (VITAMIN D) 1000 UNITS tablet Take 1,000 Units by mouth 2 (two) times daily.   . Cranberry 500 MG CAPS Take 500 mg by mouth 2 (two) times daily.  Marland Kitchen. dofetilide ([name]) 250 MCG capsule take 1 capsule by mouth twice a day  . DULERA 100-5 MCG/ACT AERO inhale 2 puffs INTO THE LUNGS every 12 hours  . DULoxetine (CYMBALTA) 60 MG capsule Take 60 mg by mouth at bedtime.   Marland Kitchen. EVZIO 0.4 MG/0.4ML SOAJ Inject 0.4 mLs as directed daily as needed (over dose).   . fluticasone (FLONASE) 50 MCG/ACT nasal spray instill 2 sprays into each nostril once daily  . Insulin Glargine (LANTUS SOLOSTAR) 100 UNIT/ML Solostar Pen Inject 30 Units into the skin at bedtime. (Patient taking differently: Inject 40 Units into the skin at bedtime. )  . loratadine (CLARITIN) 10 MG tablet take 1 tablet by mouth once daily  . magnesium oxide (MAG-OX) 400 MG tablet take 1 tablet by mouth once daily  . NEXIUM 40 MG capsule take 1 capsule by mouth twice a day BEFORE A MEAL  . nitroGLYCERIN (NITROSTAT) 0.4 MG SL tablet Place 1 tablet (0.4 mg total) under the tongue every 5 (five) minutes as needed for chest pain (do nto exceed 3 doses).  . NOVOLOG FLEXPEN 100 UNIT/ML FlexPen Inject 18-22 Units as directed 2 (two) times daily. Per sliding scale  . ondansetron (ZOFRAN ODT) 4 MG disintegrating tablet Take 1 tablet (4 mg total) by mouth every 8 (eight) hours as needed for nausea or vomiting.  Marland Kitchen. oxyCODONE-acetaminophen (PERCOCET) 10-325 MG tablet Take 1 tablet by mouth 5 (five) times daily. (scheduled)   . Polyvinyl Alcohol-Povidone (REFRESH OP) Place 1 drop into both eyes as needed (for dry eyes).  . potassium chloride (K-DUR) 10 MEQ tablet take 1 tablet by mouth once daily  . pregabalin (LYRICA) 100 MG capsule Take 100 mg by mouth 3 (three) times daily.  Marland Kitchen. PROAIR HFA 108 (90 Base) MCG/ACT inhaler inhale 2 puffs every 6 hours if needed for wheezing OR SHORTNESS OF BREATH.  . tamsulosin (FLOMAX) 0.4 MG CAPS  capsule Take 0.4 mg by mouth at bedtime.  . vitamin C (ASCORBIC ACID) 500 MG tablet Take 500 mg by mouth 2 (two) times daily.  . VOLTAREN 1 % GEL APPLY 2 INCHES TO AFFECTED AREA FOUR TIMES A DAY  . XARELTO 20 MG TABS tablet take 1 tablet by mouth once daily with SUPPER (Patient taking differently: TAKE 20 MG BY MOUTH DAILY WITH SUPPER)  . [DISCONTINUED] metoprolol tartrate (LOPRESSOR) 25 MG tablet Take 2 tablets (50 mg total) by mouth 2 (two)  times daily.     Allergies: Allergies  Allergen Reactions  . Prednisone Other (See Comments)    SENT INTO AFIB   . Amitriptyline Other (See Comments)    Disoriented.  . Hydromorphone Hcl Other (See Comments)    blood pressure drops.    Social History: The patient  reports that she has never smoked. She has never used smokeless tobacco. She reports that she does not drink alcohol or use drugs.   Family History: The patient's family history includes Alcohol abuse in her brother, father, and sister; Alzheimer's disease in her mother; Colon cancer in her maternal aunt; Depression in her brother, mother, and sister; Diabetes in her brother, maternal grandfather, maternal grandmother, mother, sister, and unknown relative; Drug abuse in her brother and sister; Heart attack in her father and mother; Heart attack (age of onset: 11) in her brother; Hyperlipidemia in her brother, father, maternal grandfather, maternal grandmother, mother, and sister; Hypertension in her brother, father, mother, sister, and unknown relative; Kidney disease in her brother and sister; Lupus in her sister; Mental illness in her mother; Ovarian cancer in her cousin and maternal aunt; Prostate cancer in her maternal grandfather.   Review of Systems: Please see the history of present illness.   Otherwise, the review of systems is positive for none.   All other systems are reviewed and negative.   Physical Exam: VS:  BP 130/80 (BP Location: Left Arm, Patient Position: Sitting, Cuff  Size: Large)   Pulse (!) 113   Ht 5\' 6"  (1.676 m)   Wt 214 lb (97.1 kg)   BMI 34.54 kg/m  .  BMI Body mass index is 34.54 kg/m.  Wt Readings from Last 3 Encounters:  04/20/17 214 lb (97.1 kg)  04/01/17 213 lb 3.2 oz (96.7 kg)  03/01/17 216 lb (98 kg)    General: Pleasant. Obese black female who is alert and in no acute distress.   HEENT: Normal.  Neck: Supple, no JVD, carotid bruits, or masses noted.  Cardiac: Irregular irregular rhythm. Rate is a little fast. No murmurs, rubs, or gallops. No significant edema on my exam.  Respiratory:  Lungs are clear to auscultation bilaterally with normal work of breathing.  GI: Soft and nontender.  MS: No deformity or atrophy. Gait and ROM intact.  Skin: Warm and dry. Color is normal.  Neuro:  Strength and sensation are intact and no gross focal deficits noted.  Psych: Alert, appropriate and with normal affect.   LABORATORY DATA:  EKG:  EKG is ordered today. This demonstrates AF with RVR - rate of 113.  Lab Results  Component Value Date   WBC 3.7 (L) 12/01/2016   HGB 12.5 12/01/2016   HCT 38.1 12/01/2016   PLT 174.0 12/01/2016   GLUCOSE 301 (H) 12/01/2016   CHOL 159 07/26/2015   TRIG 530 (H) 07/26/2015   HDL 37 (L) 07/26/2015   LDLCALC NOT CALC 07/26/2015   ALT 16 12/01/2016   AST 18 12/01/2016   NA 138 12/01/2016   K 4.0 12/01/2016   CL 103 12/01/2016   CREATININE 1.19 12/01/2016   BUN 9 12/01/2016   CO2 28 12/01/2016   TSH 1.110 03/01/2017   INR 1.62 05/10/2016   HGBA1C 10.8 04/01/2017   MICROALBUR 6.5 (H) 05/02/2015      BNP (last 3 results) No results for input(s): BNP in the last 8760 hours.  ProBNP (last 3 results) No results for input(s): PROBNP in the last 8760 hours.   Other Studies Reviewed  Today:  Echo Study Conclusions 08/2016  - Left ventricle: The cavity size was normal. Wall thickness was   normal. Systolic function was normal. The estimated ejection   fraction was in the range of 60% to 65%.  Wall motion was normal;   there were no regional wall motion abnormalities. Left   ventricular diastolic function parameters were normal. - Atrial septum: No defect or patent foramen ovale was identified. - Pericardium, extracardiac: A trivial pericardial effusion was   identified posterior to the heart.  Assessment/Plan: 1. Persistent AF - rate not controlled. Increasing metoprolol to 75 mg BID. Arrange for cardioversion for symptom relief. This has been arranged with Dr. [name] for next week. Procedure/risks/benefits have been reviewed and she is willing to proceed.  Referral to EP for consideration of other AAD options versus ablation made today as well.   2. High risk medicine -  Lab today.   3. Chronic anticoagulation -  No missed doses. Lab today. No problems noted with bleeding/bruising.   4. HTN - BP well controlled on her current regimen.   5. Obesity - has been more active. Weight down 4 pounds since last visit here.   Current medicines are reviewed with the patient today.  The patient does not have concerns regarding medicines other than what has been noted above.  The following changes have been made:  See above.  Labs/ tests ordered today include:    Orders Placed This Encounter  Procedures  . Basic metabolic panel  . CBC  . Protime-INR  . Magnesium  . APTT  . Ambulatory referral to Cardiac Electrophysiology  . EKG 12-Lead     Disposition:   FU as above. Referred for cardioversion with Dr. [name]. Referral to EP as well. Has had fairly recent echo - will not repeat at this time.   Patient is agreeable to this plan and will call if any problems develop in the interim.   SignedNorma Fredrickson, NP  04/20/2017 9:44 AM  [facility] [address] [place], [place]  [address] Phone: [phone] Fax: [phone]
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
## 2017-04-20 NOTE — Patient Instructions (Addendum)
We will be checking the following labs today - BMET, CBC, PT, PTT, Mag level   Medication Instructions:    Continue with your current medicines. BUT   I am increasing your Metoprolol to 75 mg to take twice a day - this is to help slow your heart rate down. This has been sent to your drug store.     Testing/Procedures To Be Arranged:  Cardioversion  Follow-Up:   Referral to Dr. Johney Frame to discuss ablation/other options for Atrial fib    Other Special Instructions:   Your provider has recommended a cardioversion.   You are scheduled for a cardioversion on Wednesday, September 12th at 1:00 pm with Dr. [name] or associates. Please go to [facility] 2nd Floor Short Stay at Wednesday, September 12th at 11 AM.  Enter through the [employer] A Do not have any food or drink after midnight on Tuesday.  You may take your medicines with a sip of water on the day of your procedure. BUT Take only 1/2 dose of your night time insulin on Wednesday and no insulin on Wednesday AM You will need someone to drive you home following your procedure.   Call the [facility] office at [phone] if you have any questions, problems or concerns.     Electrical Cardioversion Electrical cardioversion is the delivery of a jolt of electricity to change the rhythm of the heart. Sticky patches or metal paddles are placed on the chest to deliver the electricity from a device. This is done to restore a normal rhythm. A rhythm that is too fast or not regular keeps the heart from pumping well. Electrical cardioversion is done in an emergency if:  There is low or no blood pressure as a result of the heart rhythm.  Normal rhythm must be restored as fast as possible to protect the brain and heart from further damage.  It may save a life. Cardioversion may be done for heart rhythms that are not immediately life threatening, such as atrial fibrillation or flutter, in which:   The heart is beating too fast or is not regular.  Medicine to change the rhythm has not worked.  It is safe to wait in order to allow time for preparation. Symptoms of the abnormal rhythm are bothersome. The risk of stroke and other serious problems can be reduced.  LET [facility] CARE PROVIDER KNOW ABOUT:  Any allergies you have. All medicines you are taking, including vitamins, herbs, eye drops, creams, and over-the-counter medicines. Previous problems you or members of your family have had with the use of anesthetics.  Any blood disorders you have.  Previous surgeries you have had.  Medical conditions you have.   RISKS AND COMPLICATIONS  Generally, this is a safe procedure. However, problems can occur and include:  Breathing problems related to the anesthetic used. A blood clot that breaks free and travels to other parts of your body. This could cause a stroke or other problems. The risk of this is lowered by use of blood-thinning medicine (anticoagulant) prior to the procedure. Cardiac arrest (rare).   BEFORE THE PROCEDURE  You may have tests to detect blood clots in your heart and to evaluate heart function. You may start taking anticoagulants so your blood does not clot as easily.  Medicines may be given to help stabilize your heart rate and rhythm.   PROCEDURE You will be given medicine through an IV tube to reduce discomfort and make you sleepy (sedative).  An electrical shock will be delivered.   AFTER THE PROCEDURE Your heart rhythm will be watched to make sure it does not change. You will need someone to drive you home.         If you need a refill on your cardiac medications before your next appointment, please call your pharmacy.   Call the [facility] office at [phone] if you have any questions, problems or concerns.

## 2017-04-20 NOTE — Patient Instructions (Signed)
Knee to Chest (Flexion)    Pull knee toward chest. Feel stretch in lower back or buttock area. Breathing deeply, Hold __30__ seconds. Repeat with other knee. Repeat __3__ times. Do _2-3___ sessions per day.  Piriformis Stretch    Lying on back, pull right knee toward opposite shoulder. Hold __30__ seconds.  Repeat with other leg. Repeat __3__ times. Do __2-3__ sessions per day.  Lower Trunk Rotation Stretch    Keeping back flat and feet together, rotate knees to left side. Hold __30__ seconds.  Repeat to right side. Repeat __3__ times per set. Do __1__ sets per session. Do _2-3___ sessions per day.

## 2017-04-21 ENCOUNTER — Other Ambulatory Visit: Payer: Self-pay | Admitting: *Deleted

## 2017-04-21 LAB — CBC
Hematocrit: 39.5 % (ref 34.0–46.6)
Hemoglobin: 13.3 g/dL (ref 11.1–15.9)
MCH: 29.8 pg (ref 26.6–33.0)
MCHC: 33.7 g/dL (ref 31.5–35.7)
MCV: 88 fL (ref 79–97)
Platelets: 173 10*3/uL (ref 150–379)
RBC: 4.47 x10E6/uL (ref 3.77–5.28)
RDW: 13.7 % (ref 12.3–15.4)
WBC: 3.3 10*3/uL — ABNORMAL LOW (ref 3.4–10.8)

## 2017-04-21 LAB — BASIC METABOLIC PANEL
BUN/Creatinine Ratio: 9 — ABNORMAL LOW (ref 12–28)
BUN: 12 mg/dL (ref 8–27)
CO2: 22 mmol/L (ref 20–29)
Calcium: 9.3 mg/dL (ref 8.7–10.3)
Chloride: 99 mmol/L (ref 96–106)
Creatinine, Ser: 1.32 mg/dL — ABNORMAL HIGH (ref 0.57–1.00)
GFR calc Af Amer: 49 mL/min/{1.73_m2} — ABNORMAL LOW (ref >59)
GFR calc non Af Amer: 42 mL/min/{1.73_m2} — ABNORMAL LOW (ref >59)
Glucose: 233 mg/dL — ABNORMAL HIGH (ref 65–99)
Potassium: 4.3 mmol/L (ref 3.5–5.2)
Sodium: 140 mmol/L (ref 134–144)

## 2017-04-21 LAB — MAGNESIUM: Magnesium: 1.8 mg/dL (ref 1.6–2.3)

## 2017-04-21 LAB — PROTIME-INR
INR: 1.2 (ref 0.8–1.2)
Prothrombin Time: 12.7 s — ABNORMAL HIGH (ref 9.1–12.0)

## 2017-04-21 LAB — APTT: aPTT: 33 s (ref 24–33)

## 2017-04-21 MED ORDER — MAGNESIUM OXIDE 400 MG PO TABS: 1.0000 | ORAL_TABLET | Freq: Two times a day (BID) | ORAL | 11 refills | Status: DC

## 2017-04-24 ENCOUNTER — Encounter (HOSPITAL_COMMUNITY): Payer: Self-pay | Admitting: Oncology

## 2017-04-24 ENCOUNTER — Emergency Department (HOSPITAL_COMMUNITY)
Admission: EM | Admit: 2017-04-24 | Discharge: 2017-04-24 | Disposition: A | Discharge status: Home / Self Care | Payer: Medicare Other | Attending: Emergency Medicine | Admitting: Emergency Medicine

## 2017-04-24 ENCOUNTER — Emergency Department (HOSPITAL_COMMUNITY): Payer: Medicare Other

## 2017-04-24 DIAGNOSIS — R079 Chest pain, unspecified: Secondary | ICD-10-CM | POA: Diagnosis present

## 2017-04-24 DIAGNOSIS — N183 Chronic kidney disease, stage 3 (moderate): Secondary | ICD-10-CM | POA: Insufficient documentation

## 2017-04-24 DIAGNOSIS — E119 Type 2 diabetes mellitus without complications: Secondary | ICD-10-CM | POA: Diagnosis not present

## 2017-04-24 DIAGNOSIS — Z794 Long term (current) use of insulin: Secondary | ICD-10-CM | POA: Insufficient documentation

## 2017-04-24 DIAGNOSIS — Z79899 Other long term (current) drug therapy: Secondary | ICD-10-CM | POA: Insufficient documentation

## 2017-04-24 DIAGNOSIS — Z7983 Long term (current) use of bisphosphonates: Secondary | ICD-10-CM | POA: Diagnosis not present

## 2017-04-24 DIAGNOSIS — J45909 Unspecified asthma, uncomplicated: Secondary | ICD-10-CM | POA: Insufficient documentation

## 2017-04-24 DIAGNOSIS — I129 Hypertensive chronic kidney disease with stage 1 through stage 4 chronic kidney disease, or unspecified chronic kidney disease: Secondary | ICD-10-CM | POA: Diagnosis not present

## 2017-04-24 DIAGNOSIS — R0789 Other chest pain: Secondary | ICD-10-CM | POA: Diagnosis not present

## 2017-04-24 LAB — CBC
HCT: 38.2 % (ref 36.0–46.0)
HEMOGLOBIN: 12.4 g/dL (ref 12.0–15.0)
MCH: 28.7 pg (ref 26.0–34.0)
MCHC: 32.5 g/dL (ref 30.0–36.0)
MCV: 88.4 fL (ref 78.0–100.0)
Platelets: 185 10*3/uL (ref 150–400)
RBC: 4.32 MIL/uL (ref 3.87–5.11)
RDW: 13.6 % (ref 11.5–15.5)
WBC: 4.2 10*3/uL (ref 4.0–10.5)

## 2017-04-24 LAB — I-STAT TROPONIN, ED
TROPONIN I, POC: 0 ng/mL (ref 0.00–0.08)
Troponin i, poc: 0 ng/mL (ref 0.00–0.08)

## 2017-04-24 LAB — BASIC METABOLIC PANEL
ANION GAP: 10 (ref 5–15)
BUN: 11 mg/dL (ref 6–20)
CO2: 24 mmol/L (ref 22–32)
Calcium: 9.3 mg/dL (ref 8.9–10.3)
Chloride: 106 mmol/L (ref 101–111)
Creatinine, Ser: 1.44 mg/dL — ABNORMAL HIGH (ref 0.44–1.00)
GFR calc non Af Amer: 37 mL/min — ABNORMAL LOW (ref >60)
GFR, EST AFRICAN AMERICAN: 43 mL/min — AB (ref >60)
Glucose, Bld: 243 mg/dL — ABNORMAL HIGH (ref 65–99)
POTASSIUM: 4.1 mmol/L (ref 3.5–5.1)
Sodium: 140 mmol/L (ref 135–145)

## 2017-04-24 MED ORDER — ONDANSETRON HCL 4 MG/2ML IJ SOLN
4.0000 mg | Freq: Once | INTRAMUSCULAR | Status: AC
  Administered 2017-04-24: 4 mg via INTRAVENOUS
  Filled 2017-04-24: qty 2

## 2017-04-24 MED ORDER — KETOROLAC TROMETHAMINE 30 MG/ML IJ SOLN
15.0000 mg | Freq: Once | INTRAMUSCULAR | Status: AC
  Administered 2017-04-24: 15 mg via INTRAVENOUS
  Filled 2017-04-24: qty 1

## 2017-04-24 MED ORDER — MORPHINE SULFATE (PF) 4 MG/ML IV SOLN
2.0000 mg | Freq: Once | INTRAVENOUS | Status: AC
  Administered 2017-04-24: 2 mg via INTRAVENOUS
  Filled 2017-04-24: qty 1

## 2017-04-24 MED ORDER — OXYCODONE HCL 5 MG PO TABS
5.0000 mg | ORAL_TABLET | Freq: Once | ORAL | Status: AC
  Administered 2017-04-24: 5 mg via ORAL
  Filled 2017-04-24: qty 1

## 2017-04-24 MED ORDER — ACETAMINOPHEN 500 MG PO TABS
1000.0000 mg | ORAL_TABLET | Freq: Once | ORAL | Status: AC
  Administered 2017-04-24: 1000 mg via ORAL
  Filled 2017-04-24: qty 2

## 2017-04-24 NOTE — Discharge Instructions (Signed)
Take 4 over the counter ibuprofen tablets 3 times a day or 2 over-the-counter naproxen tablets twice a day for pain. Also take tylenol 1000mg(2 extra strength) four times a day.    

## 2017-04-24 NOTE — ED Notes (Signed)
Patient transported to X-ray 

## 2017-04-24 NOTE — ED Provider Notes (Signed)
[facility]ENCY DEPT Provider Note   CSN: [id number] Arrival date & time: 04/24/17  0149     History   Chief Complaint Chief Complaint  Patient presents with  . Chest Pain    HPI [name] is a 66 y.o. female.  66 yo F with a chief complaint of chest pain. Going on for the past couple days. Worse with movement palpation twisting. Denies injury. Denies fevers or chills. Denies cough or congestion. Denies exertional symptoms. Pain with deep inspiration. Denies prior PE or DVT. Denies slurred from edema denies estrogen use denies history of cancer.   The history is provided by the patient.  Chest Pain   This is a recurrent problem. The current episode started 2 days ago. The problem occurs constantly. The problem has not changed since onset.The pain is associated with movement and breathing. The pain is present in the lateral region. The pain is at a severity of 8/10. The pain is severe. The quality of the pain is described as brief, sharp and stabbing. The pain does not radiate. Duration of episode(s) is 2 days. The symptoms are aggravated by certain positions and deep breathing. Associated symptoms include shortness of breath. Pertinent negatives include no dizziness, no fever, no headaches, no nausea, no palpitations and no vomiting. She has tried nothing for the symptoms. The treatment provided no relief.  Her past medical history is significant for diabetes, hyperlipidemia and hypertension.  Pertinent negatives for past medical history include no DVT and no PE.  Her family medical history is significant for early MI.    Past Medical History:  Diagnosis Date  . Anxiety   . Arthritis    "knees, hands, ankles, feet" (05/12/2016)  . Asthma    Dr. [name]  . Atrial fibrillation (HCC)   . Bipolar disorder (HCC)   . Chronic back pain    "lower and middle" (05/12/2016)  . CKD (chronic kidney disease), stage III   . Colon polyps   . Depression   . Diverticulosis   . Epilepsy (HCC)    . Essential hypertension   . Fibromyalgia   . Gallstones   . GERD (gastroesophageal reflux disease)   . H/O hiatal hernia   . Hyperlipidemia   . IBS (irritable bowel syndrome)   . Kidney stones    "passed them all" (05/12/2016)  . Migraine    05/12/2016 "daily since Sunday; they had calmed to once q 6 months or so"  . Paroxysmal A-fib (HCC)    a. On [name]. b. Recurrence 08/2014 in setting of GI illness.  . Peripheral neuropathy   . Sarcoidosis of lung (HCC)    Dr. [name]  . Seizures (HCC)    "epileptic; pretty regular recently" (05/12/2016)  . Type II diabetes mellitus [facility])     Patient Active Problem List   Diagnosis Date Noted  . Back pain 12/11/2016  . Pelvic pain 11/25/2016  . Persistent atrial fibrillation (HCC)   . Irritable bowel syndrome 03/03/2016  . Chronic leukopenia 09/13/2015  . PAF (paroxysmal atrial fibrillation) (HCC) 08/22/2015  . Morbid obesity (HCC) 07/11/2015  . Bipolar I disorder (HCC) 05/22/2015  . Chronic kidney disease (CKD), stage III (moderate) 05/14/2015  . Seizure disorder (HCC)   . History of diverticulitis   . Syncope 06/29/2012  . Orthostatic hypotension 06/29/2012  . History of colonic polyps 09/01/2011  . Sarcoid (HCC) 09/01/2011  . GERD (gastroesophageal reflux disease) 09/01/2011  . 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 Diabetes mellitus type 2, uncontrolled, with complications (HCC)  11/19/2008  . Hyperlipidemia 11/19/2008  . Essential hypertension 11/19/2008    Past Surgical History:  Procedure Laterality Date  . ABDOMINAL HYSTERECTOMY  1995  . ANTERIOR CERVICAL DECOMP/DISCECTOMY FUSION  07/11/2012   Procedure: ANTERIOR CERVICAL DECOMPRESSION/DISCECTOMY FUSION 1 LEVEL;  Surgeon: [name], MD;  Location: [facility];  Service: Neurosurgery;  Laterality: N/A;  Cervical Five-Six Anterior Cervical Decompression with Fusion Interbody Prothesis Plating and Bonegraft  . CARDIAC CATHETERIZATION  2012   a. Normal coronaries 2012. b.  Normal nuc 09/2014.  Marland Kitchen CHOLECYSTECTOMY OPEN  1976  . CLOSED REDUCTION ANKLE FRACTURE Left 10/2006   "got steel rod in my leg; and screws"  . COLON SURGERY    . DILATION AND CURETTAGE OF UTERUS    . ESOPHAGEAL MANOMETRY  03/21/2012   Procedure: ESOPHAGEAL MANOMETRY (EM);  Surgeon: [name], MD;  Location: [facility];  Service: Endoscopy;  Laterality: N/A;  . FRACTURE SURGERY    . NEUROPLASTY / TRANSPOSITION MEDIAN NERVE AT CARPAL TUNNEL Left 2004  . NEUROPLASTY / TRANSPOSITION MEDIAN NERVE AT CARPAL TUNNEL Right 2002  . 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 SALPINGOOPHORECTOMY Bilateral 2000  . TUBAL LIGATION  1982    OB History    No data available       Home Medications    Prior to Admission medications   Medication Sig Start Date End Date Taking? Authorizing Provider  atorvastatin (LIPITOR) 80 MG tablet Take 80 mg by mouth at bedtime. 05/29/16  Yes [provider]  calcium carbonate (OS-CAL) 600 MG TABS tablet Take 600 mg by mouth daily.   Yes [provider]  cholecalciferol (VITAMIN D) 1000 UNITS tablet Take 1,000 Units by mouth 2 (two) times daily.    Yes [provider]  Cranberry 500 MG CAPS Take 500 mg by mouth 2 (two) times daily.   Yes [provider]  dofetilide ([name]) 250 MCG capsule take 1 capsule by mouth twice a day 09/18/16  Yes Wendall Stade, MD  DULERA 100-5 MCG/ACT AERO inhale 2 puffs INTO THE LUNGS every 12 hours 11/04/16  Yes Mikell, Antionette Poles, MD  DULoxetine (CYMBALTA) 60 MG capsule Take 60 mg by mouth at bedtime.    Yes [provider]  EVZIO 0.4 MG/0.4ML SOAJ Inject 0.4 mLs as directed daily as needed (over dose).  07/10/15  Yes [provider]  fluticasone (FLONASE) 50 MCG/ACT nasal spray instill 2 sprays into each nostril once daily 11/04/16  Yes Mikell, Antionette Poles, MD  Insulin Glargine (LANTUS SOLOSTAR) 100 UNIT/ML Solostar Pen Inject 30 Units into the skin at bedtime. Patient taking  differently: Inject 60 Units into the skin at bedtime.  08/23/15  Yes Dunn, Tacey Ruiz, PA-C  loratadine (CLARITIN) 10 MG tablet take 1 tablet by mouth once daily 04/16/17  Yes Mayo, Allyn Kenner, MD  magnesium oxide (MAG-OX) 400 MG tablet Take 1 tablet (400 mg total) by mouth 2 (two) times daily. Patient taking differently: Take 1 tablet by mouth daily.  04/21/17  Yes Rosalio Macadamia, NP  metoprolol tartrate (LOPRESSOR) 50 MG tablet Take 1.5 tablets (75 mg total) by mouth 2 (two) times daily. 04/20/17 07/19/17 Yes Rosalio Macadamia, NP  NEXIUM 40 MG capsule take 1 capsule by mouth twice a day BEFORE A MEAL 12/10/16  Yes [name], MD  nitroGLYCERIN (NITROSTAT) 0.4 MG SL tablet Place 1 tablet (0.4 mg total) under the tongue every 5 (five) minutes as needed for chest pain (do nto  exceed 3 doses). 10/15/16  Yes Wendall Stade, MD  NOVOLOG FLEXPEN 100 UNIT/ML FlexPen Inject 18-22 Units as directed 2 (two) times daily. Per sliding scale 03/29/15  Yes [provider]  ondansetron (ZOFRAN ODT) 4 MG disintegrating tablet Take 1 tablet (4 mg total) by mouth every 8 (eight) hours as needed for nausea or vomiting. 03/15/17  Yes Mikell, Antionette Poles, MD  oxyCODONE-acetaminophen (PERCOCET) 10-325 MG tablet Take 1 tablet by mouth 5 (five) times daily. (scheduled)    Yes [provider]  Polyvinyl Alcohol-Povidone (REFRESH OP) Place 1 drop into both eyes as needed (for dry eyes).   Yes [provider]  potassium chloride (K-DUR) 10 MEQ tablet take 1 tablet by mouth once daily 09/18/16  Yes Wendall Stade, MD  pregabalin (LYRICA) 100 MG capsule Take 100 mg by mouth 3 (three) times daily.   Yes [provider]  PROAIR HFA 108 (90 Base) MCG/ACT inhaler inhale 2 puffs every 6 hours if needed for wheezing OR SHORTNESS OF BREATH. 09/23/16  Yes Mikell, Antionette Poles, MD  tamsulosin (FLOMAX) 0.4 MG CAPS capsule Take 0.4 mg by mouth at bedtime. 04/16/17  Yes [provider]  vitamin C (ASCORBIC  ACID) 500 MG tablet Take 500 mg by mouth 2 (two) times daily.   Yes [provider]  VOLTAREN 1 % GEL APPLY 2 INCHES TO AFFECTED AREA FOUR TIMES A DAY 01/29/17  Yes Mikell, Antionette Poles, MD  XARELTO 20 MG TABS tablet take 1 tablet by mouth once daily with SUPPER Patient taking differently: TAKE 20 MG BY MOUTH DAILY WITH SUPPER 07/17/16  Yes Wendall Stade, MD    Family History Family History  Problem Relation Age of Onset  . Heart attack Mother        @ age 73  . Mental illness Mother   . Diabetes Mother   . Hypertension Mother        siblings  . Alzheimer's disease Mother   . Depression Mother   . Hyperlipidemia Mother   . Heart attack Brother 50  . Alcohol abuse Brother   . Depression Brother   . Diabetes Brother   . Hyperlipidemia Brother   . Hypertension Brother   . Kidney disease Brother   . Drug abuse Brother   . Alcohol abuse Father   . Heart attack Father   . Hyperlipidemia Father   . Hypertension Father   . Colon cancer Maternal Aunt   . Prostate cancer Maternal Grandfather   . Diabetes Maternal Grandfather   . Hyperlipidemia Maternal Grandfather   . Ovarian cancer Maternal Aunt   . Diabetes Unknown   . Hypertension Unknown   . Lupus Sister   . Alcohol abuse Sister   . Depression Sister   . Diabetes Sister   . Hyperlipidemia Sister   . Hypertension Sister   . Kidney disease Sister   . Drug abuse Sister   . Ovarian cancer Cousin   . Diabetes Maternal Grandmother   . Hyperlipidemia Maternal Grandmother     Social History Social History  Substance Use Topics  . Smoking status: Never Smoker  . Smokeless tobacco: Never Used  . Alcohol use No     Allergies   Prednisone; Amitriptyline; and Hydromorphone hcl   Review of Systems Review of Systems  Constitutional: Negative for chills and fever.  HENT: Negative for congestion and rhinorrhea.   Eyes: Negative for redness and visual disturbance.  Respiratory: Positive for shortness of breath.  Negative for  wheezing.   Cardiovascular: Positive for chest pain. Negative for palpitations.  Gastrointestinal: Negative for nausea and vomiting.  Genitourinary: Negative for dysuria and urgency.  Musculoskeletal: Negative for arthralgias and myalgias.  Skin: Negative for pallor and wound.  Neurological: Negative for dizziness and headaches.     Physical Exam Updated Vital Signs BP 131/77   Pulse (!) 50   Temp 98 F (36.7 C) (Oral)   Resp 18   SpO2 97%   Physical Exam  Constitutional: She is oriented to person, place, and time. She appears well-developed and well-nourished. No distress.  HENT:  Head: Normocephalic and atraumatic.  Eyes: Pupils are equal, round, and reactive to light. EOM are normal.  Neck: Normal range of motion. Neck supple.  Cardiovascular: Normal rate and regular rhythm.  Exam reveals no gallop and no friction rub.   No murmur heard. Pulmonary/Chest: Effort normal. She has no wheezes. She has no rales. She exhibits tenderness (underneath left breast, no rash).  Abdominal: Soft. She exhibits no distension. There is no tenderness.  Musculoskeletal: She exhibits no edema or tenderness.  Neurological: She is alert and oriented to person, place, and time.  Skin: Skin is warm and dry. She is not diaphoretic.  Psychiatric: She has a normal mood and affect. Her behavior is normal.  Nursing note and vitals reviewed.    ED Treatments / Results  Labs (all labs ordered are listed, but only abnormal results are displayed) Labs Reviewed  BASIC METABOLIC PANEL - Abnormal; Notable for the following:       Result Value   Glucose, Bld 243 (*)    Creatinine, Ser 1.44 (*)    GFR calc non Af Amer 37 (*)    GFR calc Af Amer 43 (*)    All other components within normal limits  CBC  I-STAT TROPONIN, ED  I-STAT TROPONIN, ED    EKG  EKG Interpretation  Date/Time:  Saturday April 24 2017 01:51:32 EDT Ventricular Rate:  103 PR Interval:    QRS Duration: 76 QT  Interval:  404 QTC Calculation: 514 R Axis:   3 Text Interpretation:  Atrial fibrillation Ventricular premature complex Low voltage, precordial leads Abnormal R-wave progression, early transition Borderline T abnormalities, lateral leads Prolonged QT interval No significant change since last tracing Confirmed by Melene Plan [phone]) on 04/24/2017 1:58:23 AM       Radiology Dg Chest 2 View  Result Date: 04/24/2017 CLINICAL DATA:  Chest pain. New diagnosis of atrial fibrillation. Chest pain started Tuesday. Central chest pain radiating to the left arm. Nausea and vomiting. Diaphoresis. Shortness of breath. Syncopal episode tonight. EXAM: CHEST  2 VIEW COMPARISON:  04/20/2016 FINDINGS: Normal heart size and pulmonary vascularity. No focal airspace disease or consolidation in the lungs. No blunting of costophrenic angles. No pneumothorax. Mediastinal contours appear intact. Postoperative changes in the cervical spine. IMPRESSION: No active cardiopulmonary disease. Electronically Signed   By: Burman Nieves M.D.   On: 04/24/2017 02:50    Procedures Procedures (including critical care time)  Medications Ordered in ED Medications  morphine 4 MG/ML injection 2 mg (2 mg Intravenous Given 04/24/17 0305)  ondansetron (ZOFRAN) injection 4 mg (4 mg Intravenous Given 04/24/17 0305)  acetaminophen (TYLENOL) tablet 1,000 mg (1,000 mg Oral Given 04/24/17 0639)  ketorolac (TORADOL) 30 MG/ML injection 15 mg (15 mg Intravenous Given 04/24/17 0640)  oxyCODONE (Oxy IR/ROXICODONE) immediate release tablet 5 mg (5 mg Oral Given 04/24/17 6045)     Initial Impression / Assessment and Plan / ED  Course  I have reviewed the triage vital signs and the nursing notes.  Pertinent labs & imaging results that were available during my care of the patient were reviewed by me and considered in my medical decision making (see chart for details).     66 yo F With a chief complaint of chest pain. Likely chest wall pain by physical  exam. Patient however has multiple risk factors will obtain a delta troponin. Initial troponin is negative. EKG unchanged. Chest x-ray negative.   Delta negative.  D/c home.   7:42 AM:  I have discussed the diagnosis/risks/treatment options with the patient and family and believe the pt to be eligible for discharge home to follow-up with PCP. We also discussed returning to the ED immediately if new or worsening sx occur. We discussed the sx which are most concerning (e.g., sudden worsening pain, fever, inability to tolerate by mouth) that necessitate immediate return. Medications administered to the patient during their visit and any new prescriptions provided to the patient are listed below.  Medications given during this visit Medications  morphine 4 MG/ML injection 2 mg (2 mg Intravenous Given 04/24/17 0305)  ondansetron (ZOFRAN) injection 4 mg (4 mg Intravenous Given 04/24/17 0305)  acetaminophen (TYLENOL) tablet 1,000 mg (1,000 mg Oral Given 04/24/17 0639)  ketorolac (TORADOL) 30 MG/ML injection 15 mg (15 mg Intravenous Given 04/24/17 0640)  oxyCODONE (Oxy IR/ROXICODONE) immediate release tablet 5 mg (5 mg Oral Given 04/24/17 [id number])     The patient appears reasonably screen and/or stabilized for discharge and I doubt any other medical condition or other [facility] requiring further screening, evaluation, or treatment in the ED at this time prior to discharge.   Final Clinical Impressions(s) / ED Diagnoses   Final diagnoses:  Chest wall pain    New Prescriptions Discharge Medication List as of 04/24/2017  6:53 AM       Melene PlanFloyd, Hero Kulish, DO 04/24/17 [phone]

## 2017-04-24 NOTE — ED Triage Notes (Addendum)
Pt bib [facility] from home d/t CP.  Per EMS pt has new dx of a. Fib.  Pt to have cardioversion this week.  Pt states that the CP started Tuesday and became worse last night. Pt reports central CP w/ radiation to left arm, N/V, diaphoresis and shob.  Pt also had a syncopal episode tonight.  Pt given 324 mg of ASA as well as one nitro tablet.

## 2017-04-24 NOTE — ED Notes (Signed)
ED Provider at bedside. 

## 2017-04-28 ENCOUNTER — Ambulatory Visit (HOSPITAL_COMMUNITY)
Admission: RE | Admit: 2017-04-28 | Discharge: 2017-04-28 | Disposition: A | Discharge status: Home / Self Care | Payer: Medicare Other | Source: Ambulatory Visit | Attending: Cardiovascular Disease | Admitting: Cardiovascular Disease

## 2017-04-28 ENCOUNTER — Encounter (HOSPITAL_COMMUNITY)
Admission: RE | Disposition: A | Discharge status: Home / Self Care | Payer: Self-pay | Source: Ambulatory Visit | Attending: Cardiovascular Disease

## 2017-04-28 DIAGNOSIS — R002 Palpitations: Secondary | ICD-10-CM | POA: Diagnosis not present

## 2017-04-28 DIAGNOSIS — Z5309 Procedure and treatment not carried out because of other contraindication: Secondary | ICD-10-CM | POA: Diagnosis not present

## 2017-04-28 SURGERY — CANCELLED PROCEDURE

## 2017-04-28 NOTE — Progress Notes (Signed)
Patient in endoscopy today for cardioversion. Patient came in NSR. Dr. Eden EmmsNishan notified and patient instructed to call the office to schedule a follow-up.

## 2017-05-02 ENCOUNTER — Other Ambulatory Visit: Payer: Self-pay | Admitting: Cardiovascular Disease

## 2017-05-02 ENCOUNTER — Other Ambulatory Visit: Payer: Self-pay | Admitting: Internal Medicine

## 2017-05-03 ENCOUNTER — Ambulatory Visit: Payer: Medicare Other | Admitting: Physical Therapy

## 2017-05-05 ENCOUNTER — Encounter (HOSPITAL_COMMUNITY): Payer: Self-pay | Admitting: *Deleted

## 2017-05-05 ENCOUNTER — Other Ambulatory Visit: Payer: Self-pay

## 2017-05-05 ENCOUNTER — Observation Stay (HOSPITAL_COMMUNITY)
Admission: EM | Admit: 2017-05-05 | Discharge: 2017-05-06 | Disposition: A | Discharge status: Home / Self Care | Payer: Medicare Other | Attending: Cardiology | Admitting: Cardiology

## 2017-05-05 ENCOUNTER — Ambulatory Visit: Payer: Medicare Other | Admitting: Physical Therapy

## 2017-05-05 ENCOUNTER — Emergency Department (HOSPITAL_COMMUNITY): Payer: Medicare Other

## 2017-05-05 ENCOUNTER — Telehealth: Payer: Self-pay | Admitting: Internal Medicine

## 2017-05-05 DIAGNOSIS — I480 Paroxysmal atrial fibrillation: Secondary | ICD-10-CM | POA: Diagnosis not present

## 2017-05-05 DIAGNOSIS — M6281 Muscle weakness (generalized): Secondary | ICD-10-CM

## 2017-05-05 DIAGNOSIS — Z794 Long term (current) use of insulin: Secondary | ICD-10-CM | POA: Diagnosis not present

## 2017-05-05 DIAGNOSIS — R002 Palpitations: Secondary | ICD-10-CM | POA: Diagnosis present

## 2017-05-05 DIAGNOSIS — M797 Fibromyalgia: Secondary | ICD-10-CM | POA: Insufficient documentation

## 2017-05-05 DIAGNOSIS — G40909 Epilepsy, unspecified, not intractable, without status epilepticus: Secondary | ICD-10-CM | POA: Diagnosis not present

## 2017-05-05 DIAGNOSIS — M5441 Lumbago with sciatica, right side: Secondary | ICD-10-CM | POA: Diagnosis not present

## 2017-05-05 DIAGNOSIS — D860 Sarcoidosis of lung: Secondary | ICD-10-CM | POA: Diagnosis not present

## 2017-05-05 DIAGNOSIS — R112 Nausea with vomiting, unspecified: Secondary | ICD-10-CM | POA: Insufficient documentation

## 2017-05-05 DIAGNOSIS — M791 Myalgia, unspecified site: Secondary | ICD-10-CM

## 2017-05-05 DIAGNOSIS — E1142 Type 2 diabetes mellitus with diabetic polyneuropathy: Secondary | ICD-10-CM | POA: Insufficient documentation

## 2017-05-05 DIAGNOSIS — N183 Chronic kidney disease, stage 3 (moderate): Secondary | ICD-10-CM | POA: Diagnosis not present

## 2017-05-05 DIAGNOSIS — F419 Anxiety disorder, unspecified: Secondary | ICD-10-CM | POA: Insufficient documentation

## 2017-05-05 DIAGNOSIS — I129 Hypertensive chronic kidney disease with stage 1 through stage 4 chronic kidney disease, or unspecified chronic kidney disease: Secondary | ICD-10-CM | POA: Diagnosis not present

## 2017-05-05 DIAGNOSIS — E1122 Type 2 diabetes mellitus with diabetic chronic kidney disease: Secondary | ICD-10-CM | POA: Diagnosis not present

## 2017-05-05 DIAGNOSIS — Z7901 Long term (current) use of anticoagulants: Secondary | ICD-10-CM | POA: Insufficient documentation

## 2017-05-05 DIAGNOSIS — K589 Irritable bowel syndrome without diarrhea: Secondary | ICD-10-CM | POA: Insufficient documentation

## 2017-05-05 DIAGNOSIS — I481 Persistent atrial fibrillation: Secondary | ICD-10-CM | POA: Insufficient documentation

## 2017-05-05 DIAGNOSIS — R293 Abnormal posture: Secondary | ICD-10-CM

## 2017-05-05 DIAGNOSIS — G8929 Other chronic pain: Secondary | ICD-10-CM | POA: Diagnosis not present

## 2017-05-05 DIAGNOSIS — I4891 Unspecified atrial fibrillation: Secondary | ICD-10-CM | POA: Diagnosis present

## 2017-05-05 DIAGNOSIS — Z79891 Long term (current) use of opiate analgesic: Secondary | ICD-10-CM | POA: Diagnosis not present

## 2017-05-05 DIAGNOSIS — E785 Hyperlipidemia, unspecified: Secondary | ICD-10-CM | POA: Insufficient documentation

## 2017-05-05 DIAGNOSIS — Z79899 Other long term (current) drug therapy: Secondary | ICD-10-CM | POA: Insufficient documentation

## 2017-05-05 DIAGNOSIS — K219 Gastro-esophageal reflux disease without esophagitis: Secondary | ICD-10-CM | POA: Insufficient documentation

## 2017-05-05 DIAGNOSIS — Z888 Allergy status to other drugs, medicaments and biological substances status: Secondary | ICD-10-CM | POA: Insufficient documentation

## 2017-05-05 DIAGNOSIS — R55 Syncope and collapse: Secondary | ICD-10-CM | POA: Insufficient documentation

## 2017-05-05 DIAGNOSIS — F319 Bipolar disorder, unspecified: Secondary | ICD-10-CM | POA: Diagnosis not present

## 2017-05-05 LAB — BASIC METABOLIC PANEL
ANION GAP: 9 (ref 5–15)
BUN: 11 mg/dL (ref 6–20)
CALCIUM: 9.9 mg/dL (ref 8.9–10.3)
CHLORIDE: 107 mmol/L (ref 101–111)
CO2: 24 mmol/L (ref 22–32)
Creatinine, Ser: 1.12 mg/dL — ABNORMAL HIGH (ref 0.44–1.00)
GFR, EST AFRICAN AMERICAN: 58 mL/min — AB (ref >60)
GFR, EST NON AFRICAN AMERICAN: 50 mL/min — AB (ref >60)
GLUCOSE: 210 mg/dL — AB (ref 65–99)
POTASSIUM: 4.3 mmol/L (ref 3.5–5.1)
Sodium: 140 mmol/L (ref 135–145)

## 2017-05-05 LAB — CBC
HCT: 40.3 % (ref 36.0–46.0)
Hemoglobin: 13.4 g/dL (ref 12.0–15.0)
MCH: 29.4 pg (ref 26.0–34.0)
MCHC: 33.3 g/dL (ref 30.0–36.0)
MCV: 88.4 fL (ref 78.0–100.0)
Platelets: 229 10*3/uL (ref 150–400)
RBC: 4.56 MIL/uL (ref 3.87–5.11)
RDW: 13.3 % (ref 11.5–15.5)
WBC: 5.1 10*3/uL (ref 4.0–10.5)

## 2017-05-05 LAB — I-STAT TROPONIN, ED: TROPONIN I, POC: 0 ng/mL (ref 0.00–0.08)

## 2017-05-05 MED ORDER — TAMSULOSIN HCL 0.4 MG PO CAPS
0.4000 mg | ORAL_CAPSULE | Freq: Every day | ORAL | Status: DC
  Administered 2017-05-06: 0.4 mg via ORAL
  Filled 2017-05-05: qty 1

## 2017-05-05 MED ORDER — ALBUTEROL SULFATE (2.5 MG/3ML) 0.083% IN NEBU: 2.5000 mg | INHALATION_SOLUTION | Freq: Four times a day (QID) | RESPIRATORY_TRACT | Status: DC | PRN

## 2017-05-05 MED ORDER — PREGABALIN 100 MG PO CAPS
100.0000 mg | ORAL_CAPSULE | Freq: Three times a day (TID) | ORAL | Status: DC
  Administered 2017-05-06: 100 mg via ORAL
  Filled 2017-05-05: qty 1

## 2017-05-05 MED ORDER — PANTOPRAZOLE SODIUM 40 MG PO TBEC: 40.0000 mg | DELAYED_RELEASE_TABLET | Freq: Every day | ORAL | Status: DC

## 2017-05-05 MED ORDER — CRANBERRY 500 MG PO CAPS: 500.0000 mg | ORAL_CAPSULE | Freq: Two times a day (BID) | ORAL | Status: DC

## 2017-05-05 MED ORDER — FLUTICASONE PROPIONATE 50 MCG/ACT NA SUSP
2.0000 | Freq: Every day | NASAL | Status: DC
  Filled 2017-05-05: qty 16

## 2017-05-05 MED ORDER — DULOXETINE HCL 60 MG PO CPEP
60.0000 mg | ORAL_CAPSULE | Freq: Every day | ORAL | Status: DC
  Administered 2017-05-06: 60 mg via ORAL
  Filled 2017-05-05: qty 1

## 2017-05-05 MED ORDER — VITAMIN D 1000 UNITS PO TABS
1000.0000 [IU] | ORAL_TABLET | Freq: Two times a day (BID) | ORAL | Status: DC
  Administered 2017-05-06: 1000 [IU] via ORAL
  Filled 2017-05-05: qty 1

## 2017-05-05 MED ORDER — OXYCODONE-ACETAMINOPHEN 10-325 MG PO TABS: 1.0000 | ORAL_TABLET | Freq: Every day | ORAL | Status: DC

## 2017-05-05 MED ORDER — VITAMIN C 500 MG PO TABS
500.0000 mg | ORAL_TABLET | Freq: Two times a day (BID) | ORAL | Status: DC
  Administered 2017-05-06: 500 mg via ORAL
  Filled 2017-05-05: qty 1

## 2017-05-05 MED ORDER — ATORVASTATIN CALCIUM 80 MG PO TABS
80.0000 mg | ORAL_TABLET | Freq: Every day | ORAL | Status: DC
  Administered 2017-05-06: 80 mg via ORAL
  Filled 2017-05-05: qty 1

## 2017-05-05 MED ORDER — DOFETILIDE 250 MCG PO CAPS
250.0000 ug | ORAL_CAPSULE | Freq: Two times a day (BID) | ORAL | Status: DC
  Administered 2017-05-06 (×2): 250 ug via ORAL
  Filled 2017-05-05 (×2): qty 1

## 2017-05-05 MED ORDER — RIVAROXABAN 20 MG PO TABS
20.0000 mg | ORAL_TABLET | Freq: Every day | ORAL | Status: DC
Start: 2017-05-06 — End: 2017-05-06
  Administered 2017-05-06: 20 mg via ORAL
  Filled 2017-05-05: qty 1

## 2017-05-05 MED ORDER — CALCIUM CARBONATE 1250 (500 CA) MG PO TABS
1250.0000 mg | ORAL_TABLET | Freq: Every day | ORAL | Status: DC
  Filled 2017-05-05: qty 1

## 2017-05-05 MED ORDER — METOPROLOL TARTRATE 25 MG PO TABS
50.0000 mg | ORAL_TABLET | Freq: Four times a day (QID) | ORAL | Status: DC
  Administered 2017-05-06: 50 mg via ORAL
  Filled 2017-05-05: qty 2

## 2017-05-05 MED ORDER — INSULIN GLARGINE 100 UNIT/ML ~~LOC~~ SOLN
30.0000 [IU] | Freq: Every day | SUBCUTANEOUS | Status: DC
  Administered 2017-05-06: 30 [IU] via SUBCUTANEOUS
  Filled 2017-05-05: qty 0.3

## 2017-05-05 MED ORDER — MOMETASONE FURO-FORMOTEROL FUM 100-5 MCG/ACT IN AERO
2.0000 | INHALATION_SPRAY | Freq: Two times a day (BID) | RESPIRATORY_TRACT | Status: DC
  Filled 2017-05-05: qty 8.8

## 2017-05-05 NOTE — Telephone Encounter (Signed)
New Message     Pt c/o Shortness Of Breath: STAT if SOB developed within the last 24 hours or pt is noticeably SOB on the phone  1. Are you currently SOB (can you hear that pt is SOB on the phone)?  no  2. How long have you been experiencing SOB?  Since her heart went out of rhythm 10 minutes ago   3. Are you SOB when sitting or when up moving around?  both 4. Are you currently experiencing any other symptoms? Heart went out of therapy at 1145am , and it started then, then it was fine for awhile , then it want up to 135 and dropped back down to 62

## 2017-05-05 NOTE — ED Provider Notes (Signed)
[facility] DEPT Provider Note   CSN: [id number] Arrival date & time: 05/05/17  1456     History   Chief Complaint Chief Complaint  Patient presents with  . Atrial Fibrillation    HPI [name] is a 66 y.o. female.  HPI   Patient is 66 year old female past medical history significant for chronic kidney disease, DM, sarcoidosis, bipolar, chronic back pain, hypertension, thyroid nausea, IBS,  A. fib. Recent increase in her beta blocker. Patient was in physical therapy today and her heart rate went up to 120 she felt dizzy. She was sent here to emergency department for further evaluation.  Past Medical History:  Diagnosis Date  . Anxiety   . Arthritis    "knees, hands, ankles, feet" (05/12/2016)  . Asthma    Dr. [name]  . Atrial fibrillation (HCC)   . Bipolar disorder (HCC)   . Chronic back pain    "lower and middle" (05/12/2016)  . CKD (chronic kidney disease), stage III   . Colon polyps   . Depression   . Diverticulosis   . Epilepsy (HCC)   . Essential hypertension   . Fibromyalgia   . Gallstones   . GERD (gastroesophageal reflux disease)   . H/O hiatal hernia   . Hyperlipidemia   . IBS (irritable bowel syndrome)   . Kidney stones    "passed them all" (05/12/2016)  . Migraine    05/12/2016 "daily since Sunday; they had calmed to once q 6 months or so"  . Paroxysmal A-fib (HCC)    a. On [name]. b. Recurrence 08/2014 in setting of GI illness.  . Peripheral neuropathy   . Sarcoidosis of lung (HCC)    Dr. [name]  . Seizures (HCC)    "epileptic; pretty regular recently" (05/12/2016)  . Type II diabetes mellitus [facility])     Patient Active Problem List   Diagnosis Date Noted  . Atrial fibrillation (HCC) 05/05/2017  . Back pain 12/11/2016  . Pelvic pain 11/25/2016  . Persistent atrial fibrillation (HCC)   . Irritable bowel syndrome 03/03/2016  . Chronic leukopenia 09/13/2015  . PAF (paroxysmal atrial fibrillation) (HCC) 08/22/2015  . Morbid obesity (HCC)  07/11/2015  . Bipolar I disorder (HCC) 05/22/2015  . Chronic kidney disease (CKD), stage III (moderate) 05/14/2015  . Seizure disorder (HCC)   . History of diverticulitis   . Syncope 06/29/2012  . Orthostatic hypotension 06/29/2012  . History of colonic polyps 09/01/2011  . Sarcoid (HCC) 09/01/2011  . GERD (gastroesophageal reflux disease) 09/01/2011  . 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 Diabetes mellitus type 2, uncontrolled, with complications (HCC) 11/19/2008  . Hyperlipidemia 11/19/2008  . Essential hypertension 11/19/2008    Past Surgical History:  Procedure Laterality Date  . ABDOMINAL HYSTERECTOMY  1995  . ANTERIOR CERVICAL DECOMP/DISCECTOMY FUSION  07/11/2012   Procedure: ANTERIOR CERVICAL DECOMPRESSION/DISCECTOMY FUSION 1 LEVEL;  Surgeon: [name], MD;  Location[facility];  Service: Neurosurgery;  Laterality: N/A;  Cervical Five-Six Anterior Cervical Decompression with Fusion Interbody Prothesis Plating and Bonegraft  . CARDIAC CATHETERIZATION  2012   a. Normal coronaries 2012. b. Normal nuc 09/2014.  Marland Kitchen CHOLECYSTECTOMY OPEN  1976  . CLOSED REDUCTION ANKLE FRACTURE Left 10/2006   "got steel rod in my leg; and screws"  . COLON SURGERY    . DILATION AND CURETTAGE OF UTERUS    . ESOPHAGEAL MANOMETRY  03/21/2012   Procedure: ESOPHAGEAL MANOMETRY (EM);  Surgeon: [name], MD;  Location: [facility];  Service: Endoscopy;  Laterality: N/A;  .  FRACTURE SURGERY    . NEUROPLASTY / TRANSPOSITION MEDIAN NERVE AT CARPAL TUNNEL Left 2004  . NEUROPLASTY / TRANSPOSITION MEDIAN NERVE AT CARPAL TUNNEL Right 2002  . 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 SALPINGOOPHORECTOMY Bilateral 2000  . TUBAL LIGATION  1982    OB History    No data available       Home Medications    Prior to Admission medications   Medication Sig Start Date End Date Taking? Authorizing Provider  atorvastatin (LIPITOR) 80 MG tablet Take 80 mg by mouth at bedtime. 05/29/16  Yes [provider]  calcium carbonate (OS-CAL) 600 MG TABS tablet Take 600 mg by mouth daily.   Yes [provider]  cholecalciferol (VITAMIN D) 1000 UNITS tablet Take 1,000 Units by mouth 2 (two) times daily.    Yes [provider]  Cranberry 500 MG CAPS Take 500 mg by mouth 2 (two) times daily.   Yes [provider]  dofetilide ([name]) 250 MCG capsule take 1 capsule by mouth twice a day Patient taking differently: Take 250 mcg by mouth two times a day 09/18/16  Yes Wendall Stade, MD  DULERA 100-5 MCG/ACT AERO inhale 2 puffs INTO THE LUNGS every 12 hours Patient taking differently: Inhale 2 puffs into the lungs every 12 hours 11/04/16  Yes Mikell, Antionette Poles, MD  DULoxetine (CYMBALTA) 60 MG capsule Take 60 mg by mouth at bedtime.    Yes [provider]  EVZIO 0.4 MG/0.4ML SOAJ Inject 0.4 mLs as directed daily as needed (for overdose).  07/10/15  Yes [provider]  fluticasone (FLONASE) 50 MCG/ACT nasal spray instill 2 sprays into each nostril once daily 11/04/16  Yes Mikell, Antionette Poles, MD  HUMALOG KWIKPEN 100 UNIT/ML KiwkPen Inject 18-22 Units into the skin 3 (three) times daily with meals. PER SLIDING SCALE 05/04/17  Yes [provider]  Insulin Glargine (LANTUS SOLOSTAR) 100 UNIT/ML Solostar Pen Inject 30 Units into the skin at bedtime. Patient taking differently: Inject 60 Units into the skin at bedtime.  08/23/15  Yes Dunn, Dayna N, PA-C  loratadine (CLARITIN) 10 MG tablet take 1 tablet by mouth once daily Patient taking differently: Take 10 mg by mouth once a day 04/16/17  Yes Mayo, Allyn Kenner, MD  magnesium oxide (MAG-OX) 400 MG tablet Take 1 tablet (400 mg total) by mouth 2 (two) times daily. Patient taking differently: Take 400 mg by mouth daily.  04/21/17  Yes Rosalio Macadamia, NP  metoprolol tartrate (LOPRESSOR) 50 MG tablet Take 1.5 tablets (75 mg total) by mouth 2 (two) times daily. 04/20/17 07/19/17 Yes Rosalio Macadamia, NP  NEXIUM 40 MG  capsule take 1 capsule by mouth twice a day BEFORE A MEAL Patient taking differently: Take 40 mg by mouth two times a day before meals 12/10/16  Yes [name], MD  nitroGLYCERIN (NITROSTAT) 0.4 MG SL tablet Place 1 tablet (0.4 mg total) under the tongue every 5 (five) minutes as needed for chest pain (do nto exceed 3 doses). Patient taking differently: Place 0.4 mg under the tongue every 5 (five) minutes x 3 doses as needed for chest pain.  10/15/16  Yes Wendall Stade, MD  ondansetron (ZOFRAN ODT) 4 MG disintegrating tablet Take 1 tablet (4 mg total) by mouth every 8 (eight) hours as needed for nausea or vomiting. 03/15/17  Yes Mikell, Antionette Poles, MD  oxyCODONE-acetaminophen (PERCOCET) 10-325 MG tablet Take 1 tablet by mouth 5 (five) times daily. (scheduled)  Yes [provider]  Polyvinyl Alcohol-Povidone (REFRESH OP) Place 1 drop into both eyes 3 (three) times daily as needed (for dry eyes).    Yes [provider]  potassium chloride (K-DUR) 10 MEQ tablet take 1 tablet by mouth once daily Patient taking differently: Take 10 mEq by mouth once a day 05/04/17  Yes Wendall Stade, MD  pregabalin (LYRICA) 100 MG capsule Take 100 mg by mouth 3 (three) times daily.   Yes [provider]  PROAIR HFA 108 [phone] Base) MCG/ACT inhaler inhale 2 puffs every 6 hours if needed for wheezing or shortness of breath Patient taking differently: Inhale 2 puffs into the lungs every 6 hours as needed for wheezing or shortness of breath 05/03/17  Yes Mikell, Antionette Poles, MD  tamsulosin (FLOMAX) 0.4 MG CAPS capsule Take 0.4 mg by mouth at bedtime. 04/16/17  Yes [provider]  vitamin C (ASCORBIC ACID) 500 MG tablet Take 500 mg by mouth 2 (two) times daily.   Yes [provider]  VOLTAREN 1 % GEL APPLY 2 INCHES TO AFFECTED AREA FOUR TIMES A DAY 01/29/17  Yes Mikell, Antionette Poles, MD  XARELTO 20 MG TABS tablet take 1 tablet by mouth once daily with SUPPER Patient taking  differently: Take 20 mg by mouth once a day with supper 07/17/16  Yes Wendall Stade, MD    Family History Family History  Problem Relation Age of Onset  . Heart attack Mother        @ age 63  . Mental illness Mother   . Diabetes Mother   . Hypertension Mother        siblings  . Alzheimer's disease Mother   . Depression Mother   . Hyperlipidemia Mother   . Heart attack Brother 50  . Alcohol abuse Brother   . Depression Brother   . Diabetes Brother   . Hyperlipidemia Brother   . Hypertension Brother   . Kidney disease Brother   . Drug abuse Brother   . Alcohol abuse Father   . Heart attack Father   . Hyperlipidemia Father   . Hypertension Father   . Colon cancer Maternal Aunt   . Prostate cancer Maternal Grandfather   . Diabetes Maternal Grandfather   . Hyperlipidemia Maternal Grandfather   . Ovarian cancer Maternal Aunt   . Diabetes Unknown   . Hypertension Unknown   . Lupus Sister   . Alcohol abuse Sister   . Depression Sister   . Diabetes Sister   . Hyperlipidemia Sister   . Hypertension Sister   . Kidney disease Sister   . Drug abuse Sister   . Ovarian cancer Cousin   . Diabetes Maternal Grandmother   . Hyperlipidemia Maternal Grandmother     Social History Social History  Substance Use Topics  . Smoking status: Never Smoker  . Smokeless tobacco: Never Used  . Alcohol use No     Allergies   Prednisone; Amitriptyline; and Hydromorphone hcl   Review of Systems Review of Systems  Constitutional: Positive for fatigue. Negative for activity change and fever.  Respiratory: Positive for shortness of breath.   Cardiovascular: Positive for palpitations. Negative for chest pain.  Gastrointestinal: Negative for abdominal pain.  Neurological: Positive for dizziness.     Physical Exam Updated Vital Signs BP (!) 160/109   Pulse 93   Temp 97.9 F (36.6 C) (Oral)   Resp 17   SpO2 99%   Physical Exam  Constitutional: She is oriented to  person, place,  and time. She appears well-developed and well-nourished.  HENT:  Head: Normocephalic and atraumatic.  Eyes: Right eye exhibits no discharge.  Cardiovascular: Regular rhythm.   No murmur heard. afib  Pulmonary/Chest: Effort normal and breath sounds normal. No respiratory distress.  Abdominal: Soft. There is no tenderness.  Neurological: She is oriented to person, place, and time.  Skin: Skin is warm and dry. She is not diaphoretic.  Psychiatric: She has a normal mood and affect.  Nursing note and vitals reviewed.    ED Treatments / Results  Labs (all labs ordered are listed, but only abnormal results are displayed) Labs Reviewed  BASIC METABOLIC PANEL - Abnormal; Notable for the following:       Result Value   Glucose, Bld 210 (*)    Creatinine, Ser 1.12 (*)    GFR calc non Af Amer 50 (*)    GFR calc Af Amer 58 (*)    All other components within normal limits  CBC  HIV ANTIBODY (ROUTINE TESTING)  TROPONIN I  BASIC METABOLIC PANEL  CBC  I-STAT TROPONIN, ED    EKG  EKG Interpretation  Date/Time:  Wednesday May 05 2017 15:00:27 EDT Ventricular Rate:  127 PR Interval:    QRS Duration: 70 QT Interval:  414 QTC Calculation: 601 R Axis:   -30 Text Interpretation:  Atrial fibrillation with rapid ventricular response with premature ventricular or aberrantly conducted complexes Left axis deviation ST & T wave abnormality, consider inferior ischemia or digitalis effect Abnormal ECG afib with RVR Confirmed by Bary Castilla ([id number]) on 05/05/2017 9:47:58 PM       Radiology Dg Chest 2 View  Result Date: 05/05/2017 CLINICAL DATA:  Chest pain EXAM: CHEST  2 VIEW COMPARISON:  04/24/2017 FINDINGS: The heart size and mediastinal contours are within normal limits. Both lungs are clear. The visualized skeletal structures are unremarkable. IMPRESSION: No active cardiopulmonary disease. Electronically Signed   By: Alcide Clever M.D.   On: 05/05/2017 16:31     Procedures Procedures (including critical care time)  Medications Ordered in ED Medications  metoprolol tartrate (LOPRESSOR) tablet 50 mg (not administered)  atorvastatin (LIPITOR) tablet 80 mg (not administered)  calcium carbonate (OS-CAL) tablet 600 mg (not administered)  cholecalciferol (VITAMIN D) tablet 1,000 Units (not administered)  Cranberry CAPS 500 mg (not administered)  dofetilide ([name]) capsule 250 mcg (not administered)  mometasone-formoterol (DULERA) 100-5 MCG/ACT inhaler 2 puff (not administered)  DULoxetine (CYMBALTA) DR capsule 60 mg (not administered)  fluticasone (FLONASE) 50 MCG/ACT nasal spray 2 spray (not administered)  Insulin Glargine (LANTUS) Solostar Pen 30 Units (not administered)  pantoprazole (PROTONIX) EC tablet 40 mg (not administered)  oxyCODONE-acetaminophen (PERCOCET) 10-325 MG per tablet 1 tablet (not administered)  pregabalin (LYRICA) capsule 100 mg (not administered)  albuterol (PROVENTIL HFA;VENTOLIN HFA) 108 (90 Base) MCG/ACT inhaler 1-2 puff (not administered)  tamsulosin (FLOMAX) capsule 0.4 mg (not administered)  vitamin C (ASCORBIC ACID) tablet 500 mg (not administered)  rivaroxaban (XARELTO) tablet 20 mg (not administered)     Initial Impression / Assessment and Plan / ED Course  I have reviewed the triage vital signs and the nursing notes.  Pertinent labs & imaging results that were available during my care of the patient were reviewed by me and considered in my medical decision making (see chart for details).     Patient is 66 year old female past medical history significant for chronic kidney disease, DM, sarcoidosis, bipolar, chronic back pain, hypertension, thyroid nausea, IBS,  A. fib. Recent  increase in her beta blocker. Patient was in physical therapy today and her heart rate went up to 120 she felt dizzy. She was sent here to emergency department for further evaluation.  11:51 PM It sounds as if patient's heart rate went  into A. fib with RVR when she was exerting herself. Otherwise now she is normal and asymptomatic. Patient's been following with [employer].  She was seen by Dr. [name] PA on 9/4 noted to be on [name] & Xarelto and on metoprolol 50 BID an increased to 75. She went for carvdioversion on Sept 12 but was back in rhythm.    Here in afib with chest pain.   Patient's heart had extensive increases (X3) in outpatient medications for A. fib. Here her heart rate is not well-controlled bouncing between 95- 130. When she exerts herself she becomes increasingly symtpomatic. Could consider either ablation or cardioversion. However with pain with exertions, CP earlier today, will need admission regardless even if cardioverted in ED. Will discuss with cards.   Final Clinical Impressions(s) / ED Diagnoses   Final diagnoses:  None    New Prescriptions New Prescriptions   No medications on file     Abelino Derrick, MD 05/05/17 2351

## 2017-05-05 NOTE — Therapy (Addendum)
[facility] [place], [place], [address] Phone: [phone]   Fax:  [phone]  Physical Therapy Treatment/Discharge  Patient Details  Name: [name] MRN: [id number] Date of Birth: 1950-09-26 Referring Provider: Tonette Bihari, MD / Lady Deutscher, MD  Encounter Date: 05/05/2017      PT End of Session - 05/05/17 1237    Visit Number 2   Number of Visits 12   Date for PT Re-Evaluation 06/01/17   Authorization Type [employer] Medicare   PT Start Time 1150   PT Stop Time 1230   PT Time Calculation (min) 40 min   Activity Tolerance Patient tolerated treatment well   Behavior During Therapy [facility] for tasks assessed/performed      Past Medical History:  Diagnosis Date  . Anxiety   . Arthritis    "knees, hands, ankles, feet" (05/12/2016)  . Asthma    Dr. Melvyn Novas  . Atrial fibrillation ([place])   . Bipolar disorder ([place])   . Chronic back pain    "lower and middle" (05/12/2016)  . CKD (chronic kidney disease), stage III   . Colon polyps   . Depression   . Diverticulosis   . Epilepsy ([place])   . Essential hypertension   . Fibromyalgia   . Gallstones   . GERD (gastroesophageal reflux disease)   . H/O hiatal hernia   . Hyperlipidemia   . IBS (irritable bowel syndrome)   . Kidney stones    "passed them all" (05/12/2016)  . Migraine    05/12/2016 "daily since Sunday; they had calmed to once q 6 months or so"  . Paroxysmal A-fib ([place])    a. On [name]. b. Recurrence 08/2014 in setting of GI illness.  . Peripheral neuropathy   . Sarcoidosis of lung ([place])    Dr. Melvyn Novas  . Seizures ([place])    "epileptic; pretty regular recently" (05/12/2016)  . Type II diabetes mellitus ([place])     Past Surgical History:  Procedure Laterality Date  . ABDOMINAL HYSTERECTOMY  1995  . ANTERIOR CERVICAL DECOMP/DISCECTOMY FUSION  07/11/2012   Procedure: ANTERIOR CERVICAL DECOMPRESSION/DISCECTOMY FUSION 1 LEVEL;  Surgeon: Ophelia Charter, MD;  Location: [place] [facility]URO ORS;  Service: Neurosurgery;  Laterality: N/A;  Cervical Five-Six Anterior Cervical Decompression with Fusion Interbody Prothesis Plating and Bonegraft  . CARDIAC CATHETERIZATION  2012   a. Normal coronaries 2012. b. Normal nuc 09/2014.  Marland Kitchen CHOLECYSTECTOMY OPEN  1976  . CLOSED REDUCTION ANKLE FRACTURE Left 10/2006   "got steel rod in my leg; and screws"  . COLON SURGERY    . DILATION AND CURETTAGE OF UTERUS    . ESOPHAGEAL MANOMETRY  03/21/2012   Procedure: ESOPHAGEAL MANOMETRY (EM);  Surgeon: Milus Banister, MD;  Location[facility];  Service: Endoscopy;  Laterality: N/A;  . FRACTURE SURGERY    . NEUROPLASTY / TRANSPOSITION MEDIAN NERVE AT CARPAL TUNNEL Left 2004  . NEUROPLASTY / TRANSPOSITION MEDIAN NERVE AT CARPAL TUNNEL Right 2002  . 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 SALPINGOOPHORECTOMY Bilateral 2000  . TUBAL LIGATION  1982    There were no vitals filed for this visit.      Subjective Assessment - 05/05/17 1154    Subjective went to ED for chest pain-not cardiac.  converted back to NSR without cardioversion.  "I stay sore." when asked how back was feeling.  midded appt monday due to transportation.   Patient Stated Goals improve pain; spend time with grandchildren   Currently  in Pain? Yes   Pain Score 5    Pain Location Back   Pain Orientation Lower   Pain Descriptors / Indicators Aching;Sore   Pain Type Chronic pain   Pain Onset More than a month ago   Pain Frequency Constant   Aggravating Factors  prolonged, sitting, standing, walking, lying down   Pain Relieving Factors pain medication                         [facility] Adult PT Treatment/Exercise - 05/05/17 1156      Exercises   Exercises Lumbar     Lumbar Exercises: Stretches   Single Knee to Chest Stretch 3 reps;30 seconds   Lower Trunk Rotation 3 reps;30 seconds   Piriformis Stretch 3 reps;30 seconds     Lumbar Exercises: Aerobic   Stationary Bike NuStep  L5 x 6 min     Lumbar Exercises: Supine   Ab Set 10 reps;5 seconds   AB Set Limitations stopped after ~ 5 reps due to pt with increased sweating and mile symptoms of diophresis                     PT Long Term Goals - 04/20/17 1552      PT LONG TERM GOAL #1   Title independent with HEP   Time 6   Period Weeks   Status New   Target Date 06/01/17     PT LONG TERM GOAL #2   Title verbalize understanding of posture/body mechanics to decrease risk of reinjury   Time 6   Period Weeks   Status New   Target Date 06/01/17     PT LONG TERM GOAL #3   Title report ability to walk > 10 min without increase in pain for improved function and pain   Time 6   Period Weeks   Status New   Target Date 06/01/17     PT LONG TERM GOAL #4   Title demonstrate at least 4/5 strength bil lower extremities for improved function   Time 6   Period Weeks   Status New   Target Date 06/01/17     PT LONG TERM GOAL #5   Title report standing tolerance of at least 15 min without increase in pain in order to better perform ADLs   Time 6   Period Weeks   Status New   Target Date 06/01/17               Plan - 05/05/17 1238    Clinical Impression Statement Pt tolerated session well today needing increased rest breaks.  Pt became mildly diophretic after ~ 5 reps of pelvic tilts so returned to sitting and symptoms subsided.  Pt concerned about going back into a-fib.  Pulse assessed at ~ 62 bpm with irregular beats noted.  Pt reports she will call Cardiologist to follow up.  Will continue to benefit from PT to maximize function.   PT Treatment/Interventions ADLs/Self Care Home Management;Cryotherapy;Moist Heat;Ultrasound;Traction;Therapeutic exercise;Therapeutic activities;Functional mobility training;Stair training;Gait training;Patient/family education;Manual techniques;Passive range of motion;Taping;Dry needling   PT Next Visit Plan gentle stretching and hip/core strengthening;  modalities PRN (no estim due to hx of seizures)   Consulted and Agree with Plan of Care Patient      Patient will benefit from skilled therapeutic intervention in order to improve the following deficits and impairments:  Abnormal gait, Pain, Increased fascial restricitons, Decreased strength, Increased muscle spasms, Difficulty walking, Decreased mobility,  Postural dysfunction, Impaired flexibility, Decreased range of motion  Visit Diagnosis: Chronic right-sided low back pain with right-sided sciatica  Muscle weakness (generalized)  Myalgia  Abnormal posture     Problem List Patient Active Problem List   Diagnosis Date Noted  . Back pain 12/11/2016  . Pelvic pain 11/25/2016  . Persistent atrial fibrillation ([place])   . Irritable bowel syndrome 03/03/2016  . Chronic leukopenia 09/13/2015  . PAF (paroxysmal atrial fibrillation) ([place]) 08/22/2015  . Morbid obesity ([place]) 07/11/2015  . Bipolar I disorder ([place]) 05/22/2015  . Chronic kidney disease (CKD), stage III (moderate) 05/14/2015  . Seizure disorder ([place])   . History of diverticulitis   . Syncope 06/29/2012  . Orthostatic hypotension 06/29/2012  . History of colonic polyps 09/01/2011  . Sarcoid ([place]) 09/01/2011  . GERD (gastroesophageal reflux disease) 09/01/2011  . 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 Diabetes mellitus type 2, uncontrolled, with complications ([place]) 49/35/5217  . Hyperlipidemia 11/19/2008  . Essential hypertension 11/19/2008    Late Entry G-Code Functional Assessment Tool Used (Outpatient Only) clinical judgement  Functional Limitation Mobility: Walking and moving around  Mobility: Walking and Moving Around Goal Status [phone]) CK  Mobility: Walking and Moving Around Discharge Status [phone]) CK     Laureen Abrahams, PT, DPT 05/05/17 12:41 PM    [facility] [address] [place], [place], [address] Phone: [phone]   Fax:  [phone]  Name:  JASEMINE NAWAZ MRN: [id number] Date of Birth: 05-26-1951       PHYSICAL THERAPY DISCHARGE SUMMARY  Visits from Start of Care: 2  Current functional level related to goals / functional outcomes: See above; all appt cx as pt reports she is having surgery; pt also with medical complications limiting participation in PT   Remaining deficits: See above   Education / Equipment: n/a Plan: Patient agrees to discharge.  Patient goals were not met. Patient is being discharged due to a change in medical status.  ?????     Laureen Abrahams, PT, DPT 05/21/17 8:14 AM  [facility] [address], [place] [address]  [phone] (office) [phone] (fax)

## 2017-05-05 NOTE — Telephone Encounter (Signed)
Pt states today while at physical therapy her heart rate went up, since then is has been up and down. Pt states her heart rate a few minutes ago was  89, O 2 Sat 97%, she does not know her BP. Pt states right now her heart rate is 120, she is sweaty and dizzy. Pt is visiting her sister, an inpatient at [facility]. Pt advised to go to [facility] ED now for further evaluation.

## 2017-05-05 NOTE — ED Triage Notes (Signed)
PT was sent here from rehab and her heart rate increased, atrial fib, chest pain, and sob.

## 2017-05-06 ENCOUNTER — Encounter (HOSPITAL_COMMUNITY): Payer: Self-pay | Admitting: Nurse Practitioner

## 2017-05-06 ENCOUNTER — Other Ambulatory Visit: Payer: Self-pay

## 2017-05-06 ENCOUNTER — Encounter (HOSPITAL_COMMUNITY)
Admission: EM | Disposition: A | Discharge status: Home / Self Care | Payer: Self-pay | Source: Home / Self Care | Attending: Physician Assistant

## 2017-05-06 DIAGNOSIS — I495 Sick sinus syndrome: Secondary | ICD-10-CM | POA: Diagnosis not present

## 2017-05-06 DIAGNOSIS — I481 Persistent atrial fibrillation: Secondary | ICD-10-CM

## 2017-05-06 DIAGNOSIS — I10 Essential (primary) hypertension: Secondary | ICD-10-CM | POA: Diagnosis not present

## 2017-05-06 DIAGNOSIS — I4891 Unspecified atrial fibrillation: Secondary | ICD-10-CM | POA: Diagnosis not present

## 2017-05-06 DIAGNOSIS — R079 Chest pain, unspecified: Secondary | ICD-10-CM | POA: Diagnosis not present

## 2017-05-06 DIAGNOSIS — E119 Type 2 diabetes mellitus without complications: Secondary | ICD-10-CM | POA: Diagnosis not present

## 2017-05-06 DIAGNOSIS — I480 Paroxysmal atrial fibrillation: Principal | ICD-10-CM

## 2017-05-06 LAB — BASIC METABOLIC PANEL
Anion gap: 14 (ref 5–15)
BUN: 12 mg/dL (ref 6–20)
CO2: 20 mmol/L — ABNORMAL LOW (ref 22–32)
Calcium: 9.2 mg/dL (ref 8.9–10.3)
Chloride: 105 mmol/L (ref 101–111)
Creatinine, Ser: 1.26 mg/dL — ABNORMAL HIGH (ref 0.44–1.00)
GFR calc Af Amer: 51 mL/min — ABNORMAL LOW (ref >60)
GFR calc non Af Amer: 44 mL/min — ABNORMAL LOW (ref >60)
Glucose, Bld: 230 mg/dL — ABNORMAL HIGH (ref 65–99)
Potassium: 3.3 mmol/L — ABNORMAL LOW (ref 3.5–5.1)
Sodium: 139 mmol/L (ref 135–145)

## 2017-05-06 LAB — CBC
HCT: 39.9 % (ref 36.0–46.0)
Hemoglobin: 13.4 g/dL (ref 12.0–15.0)
MCH: 29.4 pg (ref 26.0–34.0)
MCHC: 33.6 g/dL (ref 30.0–36.0)
MCV: 87.5 fL (ref 78.0–100.0)
Platelets: 174 10*3/uL (ref 150–400)
RBC: 4.56 MIL/uL (ref 3.87–5.11)
RDW: 13.6 % (ref 11.5–15.5)
WBC: 5.1 10*3/uL (ref 4.0–10.5)

## 2017-05-06 LAB — TROPONIN I: Troponin I: <0.03 ng/mL (ref <0.03)

## 2017-05-06 LAB — CBG MONITORING, ED
GLUCOSE-CAPILLARY: 237 mg/dL — AB (ref 65–99)
Glucose-Capillary: 210 mg/dL — ABNORMAL HIGH (ref 65–99)

## 2017-05-06 LAB — HIV ANTIBODY (ROUTINE TESTING W REFLEX): HIV Screen 4th Generation wRfx: NONREACTIVE

## 2017-05-06 SURGERY — PACEMAKER IMPLANT
Anesthesia: LOCAL

## 2017-05-06 MED ORDER — METOPROLOL TARTRATE 50 MG PO TABS: 50.0000 mg | ORAL_TABLET | Freq: Two times a day (BID) | ORAL | 3 refills | Status: DC

## 2017-05-06 MED ORDER — PROMETHAZINE HCL 25 MG/ML IJ SOLN
12.5000 mg | Freq: Once | INTRAMUSCULAR | Status: AC
  Administered 2017-05-06: 12.5 mg via INTRAVENOUS
  Filled 2017-05-06: qty 1

## 2017-05-06 MED ORDER — POTASSIUM CHLORIDE CRYS ER 20 MEQ PO TBCR
40.0000 meq | EXTENDED_RELEASE_TABLET | Freq: Once | ORAL | Status: AC
  Administered 2017-05-06: 40 meq via ORAL
  Filled 2017-05-06: qty 2

## 2017-05-06 MED ORDER — OXYCODONE-ACETAMINOPHEN 5-325 MG PO TABS
1.0000 | ORAL_TABLET | Freq: Every day | ORAL | Status: DC
  Administered 2017-05-06: 1 via ORAL
  Filled 2017-05-06: qty 1

## 2017-05-06 MED ORDER — SODIUM CHLORIDE 0.9 % IV BOLUS (SEPSIS)
500.0000 mL | Freq: Once | INTRAVENOUS | Status: AC
  Administered 2017-05-06: 500 mL via INTRAVENOUS

## 2017-05-06 MED ORDER — OXYCODONE HCL 5 MG PO TABS
5.0000 mg | ORAL_TABLET | Freq: Every day | ORAL | Status: DC
  Administered 2017-05-06: 5 mg via ORAL
  Filled 2017-05-06: qty 1

## 2017-05-06 MED ORDER — SODIUM CHLORIDE 0.9 % IV BOLUS (SEPSIS)
1000.0000 mL | Freq: Once | INTRAVENOUS | Status: AC
  Administered 2017-05-06: 1000 mL via INTRAVENOUS

## 2017-05-06 MED ORDER — INSULIN ASPART 100 UNIT/ML ~~LOC~~ SOLN
0.0000 [IU] | Freq: Three times a day (TID) | SUBCUTANEOUS | Status: DC
  Administered 2017-05-06: 5 [IU] via SUBCUTANEOUS
  Filled 2017-05-06: qty 1

## 2017-05-06 NOTE — ED Notes (Signed)
Pt noted to be bradycardic and hypotensive again at this time. Pt alert and oriented X4. Cardiology paged again. Pt placed on zoll pads.

## 2017-05-06 NOTE — ED Notes (Signed)
At approx 0405 pt was taken to the bathroom, upon arrival back to her room via wheelchair pt had a syncopal episode, where she became diaphoretic, pt hooked up to ECG monitor and noted to have a pulse of afib at a rate 32-25 bpm, radial pulse present. Staff assist pulled and pt assisted back into the bed. Pt awoke during transfer to bed from chair, alert and oriented x 4, diaphoretic, and vomiting. Pulse increased after pt awoke. Dr. Bebe Shaggy EDP assessed pt and ordered phenergan for on going nausea. Cardiology paged. Spoke with Dr. Garen Grams about pt condition, continuous chest pain, and need for SDU bed. VORB for change in level of care to step down and 0.5mg  ativan for any further nausea/vomitting.

## 2017-05-06 NOTE — ED Notes (Addendum)
At approx 0512 pt had another syncopal episode to which her heart rate was afib 54bpm, she was found to be hypotensive, diaphoretic, and pale. Pt vomited after episode. Cardiology paged. Spoke with dr. Allena Katz who ordered a NS bolus. Morning metoprolol and pain medicine held at this time.

## 2017-05-06 NOTE — Progress Notes (Signed)
    Blood pressure is in the 90s systolic.  - Gave her a 1 L fluid bolus normal saline.  - Discontinued metoprolol 50 mg every 6 hours  Reviewed electrophysiology note.  Donato Schultz, MD

## 2017-05-06 NOTE — ED Notes (Signed)
Qt is 531 at this time. Will hold [name] per administration instructions. Had cards paged.

## 2017-05-06 NOTE — ED Notes (Signed)
Per MD, pt able to eat something now. NPO afterward, save for sips with meds. Sandwich and cranberry juice given.

## 2017-05-06 NOTE — ED Notes (Signed)
Cardiology returned page. Report that EP cardiologist is coming to evaluate the patient. Instructed to call the cards PA if pt has another syncopal episode.

## 2017-05-06 NOTE — Consult Note (Signed)
ELECTROPHYSIOLOGY CONSULT NOTE    Patient ID: [name] MRN: [id number], DOB/AGE: 1951-07-08 66 y.o.  Admit date: 05/05/2017 Date of Consult: 05/06/2017  Primary Physician: [name], MD Primary Cardiologist: [name]  Electrophysiologist: Taniyah Ballow (new this admission  Patient Profile: [name] is a 66 y.o. female with a history of HTN and atrial fibrillation who is being seen today for the evaluation of AF with RVR, bradycardia and syncope at the request of Dr. Garen Grams.  HPI:  [name] is a 66 y.o. female who was first diagnosed with atrial fibrillation around 2010. She has been maintained on [name] since 2011. Over the last year, she has had recurrent symptonatic atrial fibrillation. She was seen by Norma Fredrickson in the office earlier this month at which time her metoprolol was increased and cardioversion was planned. She reverted to SB prior to cardioversion.  She went back into AF on Monday of this week and has had palpitations, weakness, fatigue, and exercise intolerance. Since Metoprolol was increased, she has had several syncopal spells that occur with and without warning, both sitting and standing. She has received Phenergan in the ER and is drowsy on exam. She presented to the ER for evaluation and was found to be in AF with RVR. She was given metoprolol for rate control with plans for admission and has had 2 syncopal spells while here, both correlating with sinus rates in the 40's and associated N/V.  She currently denies chest pain, shortness of breath, recent fevers, chills, nausea or vomiting.  Echo 1/18 demonstrated EF 60-65%, LA 37.    Past Medical History:  Diagnosis Date  . Anxiety   . Arthritis    "knees, hands, ankles, feet" (05/12/2016)  . Asthma    Dr. [name]  . Atrial fibrillation (HCC)   . Bipolar disorder (HCC)   . Chronic back pain    "lower and middle" (05/12/2016)  . CKD (chronic kidney disease), stage III   . Colon polyps   .  Depression   . Diverticulosis   . Epilepsy (HCC)   . Essential hypertension   . Fibromyalgia   . Gallstones   . GERD (gastroesophageal reflux disease)   . H/O hiatal hernia   . Hyperlipidemia   . IBS (irritable bowel syndrome)   . Paroxysmal A-fib (HCC)    a. On [name]. b. Recurrence 08/2014 in setting of GI illness.  . Peripheral neuropathy   . Sarcoidosis of lung (HCC)    Dr. [name]  . Seizures (HCC)    "epileptic; pretty regular recently" (05/12/2016)  . Type II diabetes mellitus (HCC)      Surgical History:  Past Surgical History:  Procedure Laterality Date  . ABDOMINAL HYSTERECTOMY  1995  . ANTERIOR CERVICAL DECOMP/DISCECTOMY FUSION  07/11/2012   Procedure: ANTERIOR CERVICAL DECOMPRESSION/DISCECTOMY FUSION 1 LEVEL;  Surgeon: [name], MD;  Location: [facility];  Service: Neurosurgery;  Laterality: N/A;  Cervical Five-Six Anterior Cervical Decompression with Fusion Interbody Prothesis Plating and Bonegraft  . CARDIAC CATHETERIZATION  2012   a. Normal coronaries 2012. b. Normal nuc 09/2014.  Marland Kitchen CHOLECYSTECTOMY OPEN  1976  . CLOSED REDUCTION ANKLE FRACTURE Left 10/2006   "got steel rod in my leg; and screws"  . COLON SURGERY    . DILATION AND CURETTAGE OF UTERUS    . ESOPHAGEAL MANOMETRY  03/21/2012   Procedure: ESOPHAGEAL MANOMETRY (EM);  Surgeon: [name], MD;  Location: [facility];  Service: Endoscopy;  Laterality: N/A;  .  FRACTURE SURGERY    . NEUROPLASTY / TRANSPOSITION MEDIAN NERVE AT CARPAL TUNNEL Left 2004  . NEUROPLASTY / TRANSPOSITION MEDIAN NERVE AT CARPAL TUNNEL Right 2002  . 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 SALPINGOOPHORECTOMY Bilateral 2000  . TUBAL LIGATION  1982      (Not in a hospital admission)  Inpatient Medications:  . atorvastatin  80 mg Oral QHS  . calcium carbonate  1,250 mg Oral Q breakfast  . cholecalciferol  1,000 Units Oral BID  . dofetilide  250 mcg Oral BID  . DULoxetine  60 mg Oral QHS  . fluticasone  2 spray  Each Nare Daily  . insulin aspart  0-15 Units Subcutaneous TID WC  . insulin glargine  30 Units Subcutaneous QHS  . mometasone-formoterol  2 puff Inhalation BID  . oxyCODONE-acetaminophen  1 tablet Oral 5 X Daily   And  . oxyCODONE  5 mg Oral 5 X Daily  . pantoprazole  40 mg Oral Daily  . pregabalin  100 mg Oral TID  . rivaroxaban  20 mg Oral QAC supper  . tamsulosin  0.4 mg Oral QHS  . vitamin C  500 mg Oral BID    Allergies:  Allergies  Allergen Reactions  . Prednisone Other (See Comments)    SENT PATIENT INTO A-FIB   . Amitriptyline Other (See Comments)    Made the patient disoriented  . Hydromorphone Hcl Other (See Comments)    Made the blood pressure drop (HYPOtension)    Social History   Social History  . Marital status: Divorced    Spouse name: N/A  . Number of children: 3  . Years of education: 12th   Occupational History  . disabled     CNA   Social History Main Topics  . Smoking status: Never Smoker  . Smokeless tobacco: Never Used  . Alcohol use No  . Drug use: No  . Sexual activity: Not Currently   Other Topics Concern  . Not on file   Social History Narrative   Pt. Lives at home with her daughter. She is single, has three children, does not work currently, and has a 12th grade education level. She has never used tobacco or alcohol. Quit using illicit drugs at the age of 64 and rarely ha caffeine.     Family History  Problem Relation Age of Onset  . Heart attack Mother        @ age 36  . Mental illness Mother   . Diabetes Mother   . Hypertension Mother        siblings  . Alzheimer's disease Mother   . Depression Mother   . Hyperlipidemia Mother   . Heart attack Brother 50  . Alcohol abuse Brother   . Depression Brother   . Diabetes Brother   . Hyperlipidemia Brother   . Hypertension Brother   . Kidney disease Brother   . Drug abuse Brother   . Alcohol abuse Father   . Heart attack Father   . Hyperlipidemia Father   . Hypertension  Father   . Colon cancer Maternal Aunt   . Prostate cancer Maternal Grandfather   . Diabetes Maternal Grandfather   . Hyperlipidemia Maternal Grandfather   . Ovarian cancer Maternal Aunt   . Diabetes Unknown   . Hypertension Unknown   . Lupus Sister   . Alcohol abuse Sister   . Depression Sister   . Diabetes Sister   . Hyperlipidemia Sister   . Hypertension  Sister   . Kidney disease Sister   . Drug abuse Sister   . Ovarian cancer Cousin   . Diabetes Maternal Grandmother   . Hyperlipidemia Maternal Grandmother      Review of Systems: All other systems reviewed and are otherwise negative except as noted above.  Physical Exam: Vitals:   05/06/17 0715 05/06/17 0719 05/06/17 0725 05/06/17 0730  BP: 104/90 (!)  Pulse: 78 (!) 44 (!) 48 (!) 48  Resp: 12 (!) Temp:      TempSrc:      SpO2: 91% 100% 99% 97%    GEN- The patient is sleeping but arouses, alert and oriented x 3 today.   HEENT: normocephalic, atraumatic; sclera clear, conjunctiva pink; hearing intact; oropharynx clear; neck supple Lungs- Clear to ausculation bilaterally, normal work of breathing.  No wheezes, rales, rhonchi Heart- Regular rate and rhythm  GI- soft, non-tender, non-distended, bowel sounds present Extremities- no clubbing, cyanosis, or edema  MS- no significant deformity or atrophy Skin- warm and dry, no rash or lesion Psych- euthymic mood, full affect Neuro- strength and sensation are intact  Labs:   Lab Results  Component Value Date   WBC 5.1 05/06/2017   HGB 13.4 05/06/2017   HCT 39.9 05/06/2017   MCV 87.5 05/06/2017   PLT 174 05/06/2017     Recent Labs Lab 05/06/17 0440  NA 139  K 3.3*  CL 105  CO2 20*  BUN 12  CREATININE 1.26*  CALCIUM 9.2  GLUCOSE 230*      Radiology/Studies: Dg Chest 2 View  Result Date: 05/05/2017 CLINICAL DATA:  Chest pain EXAM: CHEST  2 VIEW COMPARISON:  04/24/2017 FINDINGS: The heart size and mediastinal contours are within  normal limits. Both lungs are clear. The visualized skeletal structures are unremarkable. IMPRESSION: No active cardiopulmonary disease. Electronically Signed   By: Alcide Clever M.D.   On: 05/05/2017 16:31   Dg Chest 2 View  Result Date: 04/24/2017 CLINICAL DATA:  Chest pain. New diagnosis of atrial fibrillation. Chest pain started Tuesday. Central chest pain radiating to the left arm. Nausea and vomiting. Diaphoresis. Shortness of breath. Syncopal episode tonight. EXAM: CHEST  2 VIEW COMPARISON:  04/20/2016 FINDINGS: Normal heart size and pulmonary vascularity. No focal airspace disease or consolidation in the lungs. No blunting of costophrenic angles. No pneumothorax. Mediastinal contours appear intact. Postoperative changes in the cervical spine. IMPRESSION: No active cardiopulmonary disease. Electronically Signed   By: Burman Nieves M.D.   On: 04/24/2017 02:50    ZOX:[id number] bradycardia, QTc (personally reviewed)  TELEMETRY: AF with RVR -> SB/SR (personally reviewed)  Assessment/Plan: 1.  Persistent symptomatic atrial fibrillation The patient has persistent symptomatic atrial fibrillation and has been relatively well controlled with [name] for several years.  She unfortunately has developed recurrence lately.  Medical therapy is limited by bradycardia.  I think we have two options - we can try to increase tikosyn dose to maintain SR and decrease BB or plan for dual chamber pacemaker which would allow for expanded AAD options. She is currently drowsy from phenergan but would like to avoid procedures if possible. Dr Johney Frame to see later this morning. For now, would tolerate some AF with RVR to limit sinus bradycardia, fluid boluses as needed for hypotension She has had a negative sleep study, weight loss would help with AF management Keep K >3.9, Mg >1.8 Continue anticoagulation long term for CHADS2VASC of 4  2.  Sinus bradycardia/syncope In the setting  of increasing BB doses to  manage AF with RVR Hold BB for now  3.  HTN Stable No change required today  Dr Johney Frame to see later today   Signed, Gypsy Balsam 05/06/2017 8:26 AM  Addendum: Dr Johney Frame saw and examined patient. She reports that she has had syncope for years and came to the ER because nurse on the floor where her sister is advised her to.  She would like to avoid pacemaker and proceed with AF ablation. Plan to discharge from the ER on lower dose of Metoprolol (  twice daily).  She will Dr Johney Frame in the office tomorrow to further discuss AF ablation (tentatively planned for 05/18/17).  Discussed the AF clinic as a resource to avoid recurrent ER visits.  Ok to discharge from the ER this morning with close outpatient follow up with Dr Johney Frame.  Gypsy Balsam, NP 05/06/2017 10:57 AM   I have seen, examined the patient, and reviewed the above assessment and plan. On exam, RRR.  Changes to above are made where necessary.  Pt with symptomatic paroxsymal afib.  She has failed medical therapy with tikosyn. She has had post termination pauses.  Therapeutic strategies for afib including medicine, pacemaker implantation, and ablation were discussed in detail with the patient today. Risk, benefits, and alternatives to EP study and radiofrequency ablation for afib were also discussed in detail today. These risks include but are not limited to stroke, bleeding, vascular damage, tamponade, perforation, damage to the esophagus, lungs, and other structures, pulmonary vein stenosis, worsening renal function, and death. The patient understands these risk and wishes to proceed.  We will therefore proceed with catheter ablation at the next available time.  OK to discharge to home.  Follow-up with me tomorrow as scheduled   Co Sign: [name], MD 05/06/2017 4:33 PM

## 2017-05-06 NOTE — H&P (Signed)
Cardiology History & Physical    Patient ID: [name] MRN: [id number], DOB: 1951-01-30 Date of Encounter: 05/06/2017, 12:12 AM Primary Physician: [name], MD  Chief Complaint: Palpitations  HPI: [name] is a 66 y.o. female with history of pAF on Xarelto, DM2, pulmonary sarcoidosis, bipolar disorder, chronic back pain, hypertension, IBS, who presents with palpitations, SOB, and chest discomfort.  Pt has had pAF for several years, and currently takes [name] and Xarelto.  Her PCP first noticed that she was back in AF in July.  Upon follow up with Dr. [name] over the past 2 weeks, her metoprolol dosage was uptitrated, and she was referred for DCCV, but at the time of planned procedure, she was in NSR.  Over the past several days, pt has felt significant palpitations with light exertion, accompanied by lightheadedness and SOB. She denied PND, orthopnea, lower extremity edema or frank syncope.  Today during her PT session, she felt particularly SOB and lightheadedness; her physical therapist encouraged to present to the ED for evaluation.  While in the ED, she reported a 20-30 minute episode of CP described as SSCP.  Her Hrs have been variable between 90s-120s in AF.  Labs were unremarkable.  She was tachycardic, SOB, and lightheaded with supervised ambulation and thus was admitted for further management.  Of note, she has an upcoming appointment with Dr. Johney Frame for consideration of possible AF ablation.   Past Medical History:  Diagnosis Date  . Anxiety   . Arthritis    "knees, hands, ankles, feet" (05/12/2016)  . Asthma    Dr. [name]  . Atrial fibrillation (HCC)   . Bipolar disorder (HCC)   . Chronic back pain    "lower and middle" (05/12/2016)  . CKD (chronic kidney disease), stage III   . Colon polyps   . Depression   . Diverticulosis   . Epilepsy (HCC)   . Essential hypertension   . Fibromyalgia   . Gallstones   . GERD (gastroesophageal reflux disease)   . H/O  hiatal hernia   . Hyperlipidemia   . IBS (irritable bowel syndrome)   . Kidney stones    "passed them all" (05/12/2016)  . Migraine    05/12/2016 "daily since Sunday; they had calmed to once q 6 months or so"  . Paroxysmal A-fib (HCC)    a. On [name]. b. Recurrence 08/2014 in setting of GI illness.  . Peripheral neuropathy   . Sarcoidosis of lung (HCC)    Dr. [name]  . Seizures (HCC)    "epileptic; pretty regular recently" (05/12/2016)  . Type II diabetes mellitus (HCC)      Surgical History:  Past Surgical History:  Procedure Laterality Date  . ABDOMINAL HYSTERECTOMY  1995  . ANTERIOR CERVICAL DECOMP/DISCECTOMY FUSION  07/11/2012   Procedure: ANTERIOR CERVICAL DECOMPRESSION/DISCECTOMY FUSION 1 LEVEL;  Surgeon: [name], MD;  Location: [facility];  Service: Neurosurgery;  Laterality: N/A;  Cervical Five-Six Anterior Cervical Decompression with Fusion Interbody Prothesis Plating and Bonegraft  . CARDIAC CATHETERIZATION  2012   a. Normal coronaries 2012. b. Normal nuc 09/2014.  Marland Kitchen CHOLECYSTECTOMY OPEN  1976  . CLOSED REDUCTION ANKLE FRACTURE Left 10/2006   "got steel rod in my leg; and screws"  . COLON SURGERY    . DILATION AND CURETTAGE OF UTERUS    . ESOPHAGEAL MANOMETRY  03/21/2012   Procedure: ESOPHAGEAL MANOMETRY (EM);  Surgeon: [name], MD;  Location: [facility];  Service: Endoscopy;  Laterality:  N/A;  . FRACTURE SURGERY    . NEUROPLASTY / TRANSPOSITION MEDIAN NERVE AT CARPAL TUNNEL Left 2004  . NEUROPLASTY / TRANSPOSITION MEDIAN NERVE AT CARPAL TUNNEL Right 2002  . 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 SALPINGOOPHORECTOMY Bilateral 2000  . TUBAL LIGATION  1982     Home Meds: Prior to Admission medications   Medication Sig Start Date End Date Taking? Authorizing Provider  atorvastatin (LIPITOR) 80 MG tablet Take 80 mg by mouth at bedtime. 05/29/16  Yes [provider]  calcium carbonate (OS-CAL) 600 MG TABS tablet Take 600 mg by mouth daily.    Yes [provider]  cholecalciferol (VITAMIN D) 1000 UNITS tablet Take 1,000 Units by mouth 2 (two) times daily.    Yes [provider]  Cranberry 500 MG CAPS Take 500 mg by mouth 2 (two) times daily.   Yes [provider]  dofetilide ([name]) 250 MCG capsule take 1 capsule by mouth twice a day Patient taking differently: Take 250 mcg by mouth two times a day 09/18/16  Yes Wendall Stade, MD  DULERA 100-5 MCG/ACT AERO inhale 2 puffs INTO THE LUNGS every 12 hours Patient taking differently: Inhale 2 puffs into the lungs every 12 hours 11/04/16  Yes Mikell, Antionette Poles, MD  DULoxetine (CYMBALTA) 60 MG capsule Take 60 mg by mouth at bedtime.    Yes [provider]  EVZIO 0.4 MG/0.4ML SOAJ Inject 0.4 mLs as directed daily as needed (for overdose).  07/10/15  Yes [provider]  fluticasone (FLONASE) 50 MCG/ACT nasal spray instill 2 sprays into each nostril once daily 11/04/16  Yes Mikell, Antionette Poles, MD  HUMALOG KWIKPEN 100 UNIT/ML KiwkPen Inject 18-22 Units into the skin 3 (three) times daily with meals. PER SLIDING SCALE 05/04/17  Yes [provider]  Insulin Glargine (LANTUS SOLOSTAR) 100 UNIT/ML Solostar Pen Inject 30 Units into the skin at bedtime. Patient taking differently: Inject 60 Units into the skin at bedtime.  08/23/15  Yes Dunn, Dayna N, PA-C  loratadine (CLARITIN) 10 MG tablet take 1 tablet by mouth once daily Patient taking differently: Take 10 mg by mouth once a day 04/16/17  Yes Mayo, Allyn Kenner, MD  magnesium oxide (MAG-OX) 400 MG tablet Take 1 tablet (400 mg total) by mouth 2 (two) times daily. Patient taking differently: Take 400 mg by mouth daily.  04/21/17  Yes Rosalio Macadamia, NP  metoprolol tartrate (LOPRESSOR) 50 MG tablet Take 1.5 tablets (75 mg total) by mouth 2 (two) times daily. 04/20/17 07/19/17 Yes Rosalio Macadamia, NP  NEXIUM 40 MG capsule take 1 capsule by mouth twice a day BEFORE A MEAL Patient taking differently: Take  40 mg by mouth two times a day before meals 12/10/16  Yes [name], MD  nitroGLYCERIN (NITROSTAT) 0.4 MG SL tablet Place 1 tablet (0.4 mg total) under the tongue every 5 (five) minutes as needed for chest pain (do nto exceed 3 doses). Patient taking differently: Place 0.4 mg under the tongue every 5 (five) minutes x 3 doses as needed for chest pain.  10/15/16  Yes Wendall Stade, MD  ondansetron (ZOFRAN ODT) 4 MG disintegrating tablet Take 1 tablet (4 mg total) by mouth every 8 (eight) hours as needed for nausea or vomiting. 03/15/17  Yes Mikell, Antionette Poles, MD  oxyCODONE-acetaminophen (PERCOCET) 10-325 MG tablet Take 1 tablet by mouth 5 (five) times daily. (scheduled)    Yes [provider]  Polyvinyl Alcohol-Povidone (REFRESH OP) Place 1  drop into both eyes 3 (three) times daily as needed (for dry eyes).    Yes [provider]  potassium chloride (K-DUR) 10 MEQ tablet take 1 tablet by mouth once daily Patient taking differently: Take 10 mEq by mouth once a day 05/04/17  Yes Wendall Stade, MD  pregabalin (LYRICA) 100 MG capsule Take 100 mg by mouth 3 (three) times daily.   Yes [provider]  PROAIR HFA 108 [phone] Base) MCG/ACT inhaler inhale 2 puffs every 6 hours if needed for wheezing or shortness of breath Patient taking differently: Inhale 2 puffs into the lungs every 6 hours as needed for wheezing or shortness of breath 05/03/17  Yes Mikell, Antionette Poles, MD  tamsulosin (FLOMAX) 0.4 MG CAPS capsule Take 0.4 mg by mouth at bedtime. 04/16/17  Yes [provider]  vitamin C (ASCORBIC ACID) 500 MG tablet Take 500 mg by mouth 2 (two) times daily.   Yes [provider]  VOLTAREN 1 % GEL APPLY 2 INCHES TO AFFECTED AREA FOUR TIMES A DAY 01/29/17  Yes Mikell, Antionette Poles, MD  XARELTO 20 MG TABS tablet take 1 tablet by mouth once daily with SUPPER Patient taking differently: Take 20 mg by mouth once a day with supper 07/17/16  Yes Wendall Stade, MD     Allergies:  Allergies  Allergen Reactions  . Prednisone Other (See Comments)    SENT PATIENT INTO A-FIB   . Amitriptyline Other (See Comments)    Made the patient disoriented  . Hydromorphone Hcl Other (See Comments)    Made the blood pressure drop (HYPOtension)    Social History   Social History  . Marital status: Divorced    Spouse name: N/A  . Number of children: 3  . Years of education: 12th   Occupational History  . disabled     CNA   Social History Main Topics  . Smoking status: Never Smoker  . Smokeless tobacco: Never Used  . Alcohol use No  . Drug use: No  . Sexual activity: Not Currently   Other Topics Concern  . Not on file   Social History Narrative   Pt. Lives at home with her daughter. She is single, has three children, does not work currently, and has a 12th grade education level. She has never used tobacco or alcohol. Quit using illicit drugs at the age of 9 and rarely ha caffeine.     Family History  Problem Relation Age of Onset  . Heart attack Mother        @ age 27  . Mental illness Mother   . Diabetes Mother   . Hypertension Mother        siblings  . Alzheimer's disease Mother   . Depression Mother   . Hyperlipidemia Mother   . Heart attack Brother 50  . Alcohol abuse Brother   . Depression Brother   . Diabetes Brother   . Hyperlipidemia Brother   . Hypertension Brother   . Kidney disease Brother   . Drug abuse Brother   . Alcohol abuse Father   . Heart attack Father   . Hyperlipidemia Father   . Hypertension Father   . Colon cancer Maternal Aunt   . Prostate cancer Maternal Grandfather   . Diabetes Maternal Grandfather   . Hyperlipidemia Maternal Grandfather   . Ovarian cancer Maternal Aunt   . Diabetes Unknown   . Hypertension Unknown   . Lupus Sister   . Alcohol abuse Sister   .  Depression Sister   . Diabetes Sister   . Hyperlipidemia Sister   . Hypertension Sister   . Kidney disease Sister   . Drug abuse Sister    . Ovarian cancer Cousin   . Diabetes Maternal Grandmother   . Hyperlipidemia Maternal Grandmother     Review of Systems: All other systems reviewed and are otherwise negative except as noted above.  Labs:   Lab Results  Component Value Date   WBC 5.1 05/05/2017   HGB 13.4 05/05/2017   HCT 40.3 05/05/2017   MCV 88.4 05/05/2017   PLT 229 05/05/2017    Recent Labs Lab 05/05/17 1604  NA 140  K 4.3  CL 107  CO2 24  BUN 11  CREATININE 1.12*  CALCIUM 9.9  GLUCOSE 210*   No results for input(s): CKTOTAL, CKMB, TROPONINI in the last 72 hours. Lab Results  Component Value Date   CHOL 159 07/26/2015   HDL 37 (L) 07/26/2015   LDLCALC NOT CALC 07/26/2015   TRIG 530 (H) 07/26/2015   Lab Results  Component Value Date   DDIMER  01/15/2011    0.30        AT THE INHOUSE ESTABLISHED CUTOFF VALUE OF 0.48 ug/mL FEU, THIS ASSAY HAS BEEN DOCUMENTED IN THE LITERATURE TO HAVE A SENSITIVITY AND NEGATIVE PREDICTIVE VALUE OF AT LEAST 98 TO 99%.  THE TEST RESULT SHOULD BE CORRELATED WITH AN ASSESSMENT OF THE CLINICAL PROBABILITY OF DVT / VTE.    Radiology/Studies:  Dg Chest 2 View  Result Date: 05/05/2017 CLINICAL DATA:  Chest pain EXAM: CHEST  2 VIEW COMPARISON:  04/24/2017 FINDINGS: The heart size and mediastinal contours are within normal limits. Both lungs are clear. The visualized skeletal structures are unremarkable. IMPRESSION: No active cardiopulmonary disease. Electronically Signed   By: Alcide Clever M.D.   On: 05/05/2017 16:31   Dg Chest 2 View  Result Date: 04/24/2017 CLINICAL DATA:  Chest pain. New diagnosis of atrial fibrillation. Chest pain started Tuesday. Central chest pain radiating to the left arm. Nausea and vomiting. Diaphoresis. Shortness of breath. Syncopal episode tonight. EXAM: CHEST  2 VIEW COMPARISON:  04/20/2016 FINDINGS: Normal heart size and pulmonary vascularity. No focal airspace disease or consolidation in the lungs. No blunting of costophrenic angles.  No pneumothorax. Mediastinal contours appear intact. Postoperative changes in the cervical spine. IMPRESSION: No active cardiopulmonary disease. Electronically Signed   By: Burman Nieves M.D.   On: 04/24/2017 02:50   Wt Readings from Last 3 Encounters:  04/20/17 97.1 kg (214 lb)  04/01/17 96.7 kg (213 lb 3.2 oz)  03/01/17 98 kg (216 lb)    EKG: AF with occasional PVCs vs Ashman's phenomenon  Physical Exam: Blood pressure (!) 160/109, pulse 93, temperature 97.9 F (36.6 C), temperature source Oral, resp. rate 17, SpO2 99 %. There is no height or weight on file to calculate BMI. General: Well developed, well nourished, in no acute distress. Head: Normocephalic, atraumatic, sclera non-icteric, no xanthomas, nares are without discharge.  Neck: Negative for carotid bruits. JVD not elevated. Lungs: Clear bilaterally to auscultation without wheezes, rales, or rhonchi. Breathing is unlabored. Heart: Tachycardic, irregular. No murmurs, rubs, or gallops appreciated. Abdomen: Soft, non-tender, non-distended with normoactive bowel sounds. No hepatomegaly. No rebound/guarding. No obvious abdominal masses. Msk:  Strength and tone appear normal for age. Extremities: No clubbing or cyanosis. No edema.  Distal pedal pulses are 2+ and equal bilaterally. Neuro: Alert and oriented X 3. No focal deficit. No facial asymmetry. Moves all extremities  spontaneously. Psych:  Responds to questions appropriately with a normal affect.    Assessment and Plan  with history of pAF on Xarelto, DM2, pulmonary sarcoidosis, bipolar disorder, chronic back pain, hypertension, IBS, who presents with palpitations, SOB, and chest discomfort, found to have rapid AF.  1.  AF: Uptitrate metoprolol to 50 mg Q6H.  Will keep NPO for possible DCCV in AM.  Pt reports compliance with Xarelto without any recent missed doses.  Continue home dofetilide.  Was scheduled to see Dr. Johney Frame presently; depending on trajectory, could consider EP  c/s while in house.  2.  CP: Likely in the context of rapid AF.  Initial CBM negative.  Will check B set.  3. HTN: Metoprolol as above, would consider addition of ACE-I or ARB given DM2.  4.  DM2:  Continue home basal insulin regimen with sliding scale coverage.  5. Chronic pain: Continue home meds.  Signed, Esmond Plants, MD 05/06/2017, 12:12 AM

## 2017-05-06 NOTE — ED Notes (Signed)
Spoke with cardiology PA about pt hypotension s/p bolus and current condition. Pt to be seen by cardiology today. n o new orders at this time.

## 2017-05-06 NOTE — ED Notes (Signed)
Attempted to gain IV access x2, without success.  

## 2017-05-06 NOTE — Discharge Instructions (Signed)
Multiple studies have shown that being followed by a dedicated atrial fibrillation clinic in addition to the standard care you receive from your other physicians improves health. We believe that enrollment in the atrial fibrillation clinic will allow [place] to better care for you.   The phone number to the Atrial Fibrillation Clinic is [phone]. The clinic is staffed Monday through Friday from 8:30am to 5pm.  Parking Directions: The clinic is located in the Heart and Vascular Building connected to [facility]. 1)From [address] turn on to [address] and go to the 3rd entrance  (Heart and Vascular entrance) on the right. 2)Look to the right for Heart &Vascular Parking Garage. 3)A code for the entrance is required please call the clinic to receive this.   4)Take the elevators to the 1st floor. Registration is in the room with the glass walls at the end of the hallway.  If you have any trouble parking or locating the clinic, please dont hesitate to call [phone].

## 2017-05-06 NOTE — ED Notes (Signed)
Okay to administer [name] per cards.

## 2017-05-06 NOTE — ED Provider Notes (Signed)
I was called to room because became bradycardic and started vomiting Pt had syncopal episode and diaphoretic No fall reported Pt now awake/alert, vomiting phenergen ordered (h/o prolonged QT) See nursing note for details   EKG Interpretation  Date/Time:  Thursday May 06 2017 04:20:04 EDT Ventricular Rate:  103 PR Interval:    QRS Duration: 92 QT Interval:  398 QTC Calculation: 521 R Axis:   -20 Text Interpretation:  Atrial fibrillation Borderline left axis deviation Low voltage, precordial leads Abnormal R-wave progression, early transition Nonspecific T abnormalities, diffuse leads Prolonged QT interval Abnormal ekg Confirmed by Zadie Rhine ([id number]) on 05/06/2017 5:19:18 AM      Cardiology has been paged Pt is now improved    Zadie Rhine, MD 05/06/17 [phone]

## 2017-05-06 NOTE — Progress Notes (Signed)
PHARMACIST - PHYSICIAN ORDER COMMUNICATION  CONCERNING: P&T Medication Policy on Herbal Medications  DESCRIPTION:  This patient's order for:  Cranberry  has been noted.  This product(s) is classified as an "herbal" or natural product. Due to a lack of definitive safety studies or FDA approval, nonstandard manufacturing practices, plus the potential risk of unknown drug-drug interactions while on inpatient medications, the Pharmacy and Therapeutics Committee does not permit the use of "herbal" or natural products of this type within [facility].   ACTION TAKEN: The pharmacy department is unable to verify this order at this time and your patient has been informed of this safety policy. Please reevaluate patient's clinical condition at discharge and address if the herbal or natural product(s) should be resumed at that time.   

## 2017-05-07 ENCOUNTER — Encounter: Payer: Self-pay | Admitting: Internal Medicine

## 2017-05-07 ENCOUNTER — Ambulatory Visit (INDEPENDENT_AMBULATORY_CARE_PROVIDER_SITE_OTHER): Payer: Medicare Other | Admitting: Internal Medicine

## 2017-05-07 VITALS — BP 122/84 | HR 128 | Ht 66.0 in | Wt 217.8 lb

## 2017-05-07 DIAGNOSIS — I495 Sick sinus syndrome: Secondary | ICD-10-CM

## 2017-05-07 DIAGNOSIS — I480 Paroxysmal atrial fibrillation: Secondary | ICD-10-CM

## 2017-05-07 NOTE — Patient Instructions (Addendum)
Medication Instructions:  Your physician recommends that you continue on your current medications as directed. Please refer to the Current Medication list given to you today.   Labwork: None ordered    Testing/Procedures: Your physician has requested that you have a TEE. During a TEE, sound waves are used to create images of your heart. It provides your doctor with information about the size and shape of your heart and how well your heart's chambers and valves are working. In this test, a transducer is attached to the end of a flexible tube that's guided down your throat and into your esophagus (the tube leading from you mouth to your stomach) to get a more detailed image of your heart. You are not awake for the procedure. Please see the instruction sheet given to you today. For further information please visit [email or web address]   Your physician has recommended that you have an ablation. Catheter ablation is a medical procedure used to treat some cardiac arrhythmias (irregular heartbeats). During catheter ablation, a long, thin, flexible tube is put into a blood vessel in your groin (upper thigh), or neck. This tube is called an ablation catheter. It is then guided to your heart through the blood vessel. Radio frequency waves destroy small areas of heart tissue where abnormal heartbeats may cause an arrhythmia to start. Please see the instruction sheet given to you today.--05/18/17  Please arrive at [facility] Entr[facility] at 7:30am Do not eat or drink after midnight the night prior to the procedure Do not take any medications the morning of the test Plan for one night stay Will need someone to drive you home at discharge    Follow-Up: Your physician recommends that you schedule a follow-up appointment in: 4 weeks from 05/18/17 in afib clinic and 3 months from 05/18/17 with Dr Johney Frame   Thank you for choosing [facility]!!     Dennis Bast, RN [phone]

## 2017-05-07 NOTE — Progress Notes (Signed)
Electrophysiology Office Note   Date:  05/07/2017   ID:  [name], DOB 23-Aug-1950, MRN [id number]  PCP:  [name], MD  Cardiologist:  Dr [name] Primary Electrophysiologist: [name], MD    CC: atrial arrhythmias   History of Present Illness: [name] is a 66 y.o. female who presents today for electrophysiology evaluation.   The patient has symptomatic recurrent atrial arrhythmias despite tikosyn.  She presented to the ED yesterday (my note reviewed) with tachypalpitations and was found to have afib.  She also has post termination pauses which limit medical therapy.  She has done reasonably well since her ED visit yesterday.  Today, she denies symptoms of chest pain, shortness of breath, orthopnea, PND, lower extremity edema, claudication, dizziness, presyncope, syncope, bleeding, or neurologic sequela. The patient is tolerating medications without difficulties and is otherwise without complaint today.    Past Medical History:  Diagnosis Date  . Anxiety   . Arthritis    "knees, hands, ankles, feet" (05/12/2016)  . Asthma    Dr. [name]  . Atrial fibrillation (HCC)   . Bipolar disorder (HCC)   . Chronic back pain    "lower and middle" (05/12/2016)  . CKD (chronic kidney disease), stage III   . Colon polyps   . Depression   . Diverticulosis   . Epilepsy (HCC)   . Essential hypertension   . Fibromyalgia   . Gallstones   . GERD (gastroesophageal reflux disease)   . H/O hiatal hernia   . Hyperlipidemia   . IBS (irritable bowel syndrome)   . Paroxysmal A-fib (HCC)    a. On [name]. b. Recurrence 08/2014 in setting of GI illness.  . Peripheral neuropathy   . Sarcoidosis of lung (HCC)    Dr. [name]  . Seizures (HCC)    "epileptic; pretty regular recently" (05/12/2016)  . Type II diabetes mellitus (HCC)    Past Surgical History:  Procedure Laterality Date  . ABDOMINAL HYSTERECTOMY  1995  . ANTERIOR CERVICAL DECOMP/DISCECTOMY FUSION  07/11/2012   Procedure: ANTERIOR CERVICAL DECOMPRESSION/DISCECTOMY FUSION 1 LEVEL;  Surgeon: [name], MD;  Location: [facility];  Service: Neurosurgery;  Laterality: N/A;  Cervical Five-Six Anterior Cervical Decompression with Fusion Interbody Prothesis Plating and Bonegraft  . CARDIAC CATHETERIZATION  2012   a. Normal coronaries 2012. b. Normal nuc 09/2014.  Marland Kitchen CHOLECYSTECTOMY OPEN  1976  . CLOSED REDUCTION ANKLE FRACTURE Left 10/2006   "got steel rod in my leg; and screws"  . COLON SURGERY    . DILATION AND CURETTAGE OF UTERUS    . ESOPHAGEAL MANOMETRY  03/21/2012   Procedure: ESOPHAGEAL MANOMETRY (EM);  Surgeon: [name], MD;  Location: [facility];  Service: Endoscopy;  Laterality: N/A;  . FRACTURE SURGERY    . NEUROPLASTY / TRANSPOSITION MEDIAN NERVE AT CARPAL TUNNEL Left 2004  . NEUROPLASTY / TRANSPOSITION MEDIAN NERVE AT CARPAL TUNNEL Right 2002  . 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 SALPINGOOPHORECTOMY Bilateral 2000  . TUBAL LIGATION  1982     Current Outpatient Prescriptions  Medication Sig Dispense Refill  . atorvastatin (LIPITOR) 80 MG tablet Take 80 mg by mouth at bedtime.  0  . calcium carbonate (OS-CAL) 600 MG TABS tablet Take 600 mg by mouth daily.    . cholecalciferol (VITAMIN D) 1000 UNITS tablet Take 1,000 Units by mouth 2 (two) times daily.     . Cranberry 500 MG CAPS Take 500 mg by mouth 2 (two) times daily.    Marland Kitchen  dofetilide ([name]) 250 MCG capsule take 1 capsule by mouth twice a day (Patient taking differently: Take 250 mcg by mouth two times a day) 180 capsule 0  . DULERA 100-5 MCG/ACT AERO inhale 2 puffs INTO THE LUNGS every 12 hours (Patient taking differently: Inhale 2 puffs into the lungs every 12 hours) 13 g 11  . DULoxetine (CYMBALTA) 60 MG capsule Take 60 mg by mouth at bedtime.     Marland Kitchen EVZIO 0.4 MG/0.4ML SOAJ Inject 0.4 mLs as directed daily as needed (for overdose).   0  . fluticasone (FLONASE) 50 MCG/ACT nasal spray instill 2 sprays into each  nostril once daily 16 g 6  . HUMALOG KWIKPEN 100 UNIT/ML KiwkPen Inject 18-22 Units into the skin 3 (three) times daily with meals. PER SLIDING SCALE  0  . Insulin Glargine (LANTUS SOLOSTAR) 100 UNIT/ML Solostar Pen Inject 30 Units into the skin at bedtime. (Patient taking differently: Inject 60 Units into the skin at bedtime. )    . loratadine (CLARITIN) 10 MG tablet take 1 tablet by mouth once daily (Patient taking differently: Take 10 mg by mouth once a day) 30 tablet 1  . magnesium oxide (MAG-OX) 400 MG tablet Take 1 tablet (400 mg total) by mouth 2 (two) times daily. (Patient taking differently: Take 400 mg by mouth daily. ) 60 tablet 11  . metoprolol tartrate (LOPRESSOR) 50 MG tablet Take 1 tablet (50 mg total) by mouth 2 (two) times daily. 270 tablet 3  . NEXIUM 40 MG capsule take 1 capsule by mouth twice a day BEFORE A MEAL (Patient taking differently: Take 40 mg by mouth two times a day before meals) 60 capsule 6  . nitroGLYCERIN (NITROSTAT) 0.4 MG SL tablet Place 1 tablet (0.4 mg total) under the tongue every 5 (five) minutes as needed for chest pain (do nto exceed 3 doses). (Patient taking differently: Place 0.4 mg under the tongue every 5 (five) minutes x 3 doses as needed for chest pain. ) 25 tablet 2  . ondansetron (ZOFRAN ODT) 4 MG disintegrating tablet Take 1 tablet (4 mg total) by mouth every 8 (eight) hours as needed for nausea or vomiting. 10 tablet 0  . oxyCODONE-acetaminophen (PERCOCET) 10-325 MG tablet Take 1 tablet by mouth 5 (five) times daily. (scheduled)     . Polyvinyl Alcohol-Povidone (REFRESH OP) Place 1 drop into both eyes 3 (three) times daily as needed (for dry eyes).     . potassium chloride (K-DUR) 10 MEQ tablet take 1 tablet by mouth once daily (Patient taking differently: Take 10 mEq by mouth once a day) 90 tablet 3  . pregabalin (LYRICA) 100 MG capsule Take 100 mg by mouth 3 (three) times daily.    Marland Kitchen PROAIR HFA 108 (90 Base) MCG/ACT inhaler inhale 2 puffs every 6  hours if needed for wheezing or shortness of breath (Patient taking differently: Inhale 2 puffs into the lungs every 6 hours as needed for wheezing or shortness of breath) 8.5 g 1  . tamsulosin (FLOMAX) 0.4 MG CAPS capsule Take 0.4 mg by mouth at bedtime.  0  . vitamin C (ASCORBIC ACID) 500 MG tablet Take 500 mg by mouth 2 (two) times daily.    . VOLTAREN 1 % GEL APPLY 2 INCHES TO AFFECTED AREA FOUR TIMES A DAY 500 g 3  . XARELTO 20 MG TABS tablet take 1 tablet by mouth once daily with SUPPER (Patient taking differently: Take 20 mg by mouth once a day with supper) 30 tablet 11  No current facility-administered medications for this visit.     Allergies:   Prednisone; Amitriptyline; and Hydromorphone hcl   Social History:  The patient  reports that she has never smoked. She has never used smokeless tobacco. She reports that she does not drink alcohol or use drugs.   Family History:  The patient's  family history includes Alcohol abuse in her brother, father, and sister; Alzheimer's disease in her mother; Colon cancer in her maternal aunt; Depression in her brother, mother, and sister; Diabetes in her brother, maternal grandfather, maternal grandmother, mother, sister, and unknown relative; Drug abuse in her brother and sister; Heart attack in her father and mother; Heart attack (age of onset: 53) in her brother; Hyperlipidemia in her brother, father, maternal grandfather, maternal grandmother, mother, and sister; Hypertension in her brother, father, mother, sister, and unknown relative; Kidney disease in her brother and sister; Lupus in her sister; Mental illness in her mother; Ovarian cancer in her cousin and maternal aunt; Prostate cancer in her maternal grandfather.    ROS:  Please see the history of present illness.   All other systems are personally reviewed and negative.    PHYSICAL EXAM: VS:  BP 122/84   Pulse (!) 128   Ht  (1.676 m)   Wt 217 lb 12.8 oz (98.8 kg)   BMI 35.15 kg/m   , BMI Body mass index is 35.15 kg/m. GEN: Well nourished, well developed, in no acute distress  HEENT: normal  Neck: no JVD, carotid bruits, or masses Cardiac: tachycardic irregular rhythm, no murmurs, rubs, or gallops,no edema  Respiratory:  clear to auscultation bilaterally, normal work of breathing GI: soft, nontender, nondistended, + BS MS: no deformity or atrophy  Skin: warm and dry  Neuro:  Strength and sensation are intact Psych: euthymic mood, full affect  EKG:  EKG is ordered today. The ekg ordered today is personally reviewed and shows atypical atrial flutter 128 bpm   Recent Labs: 12/01/2016: ALT 16 03/01/2017: TSH 1.110 04/20/2017: Magnesium 1.8 05/06/2017: BUN 12; Creatinine, Ser 1.26; Hemoglobin 13.4; Platelets 174; Potassium 3.3; Sodium 139  personally reviewed   Lipid Panel     Component Value Date/Time   CHOL 159 07/26/2015 1449   TRIG 530 (H) 07/26/2015 1449   HDL 37 (L) 07/26/2015 1449   CHOLHDL 4.3 07/26/2015 1449   VLDL NOT CALC 07/26/2015 1449   LDLCALC NOT CALC 07/26/2015 1449   personally reviewed   Wt Readings from Last 3 Encounters:  05/07/17 217 lb 12.8 oz (98.8 kg)  04/20/17 214 lb (97.1 kg)  04/01/17 213 lb 3.2 oz (96.7 kg)     ASSESSMENT AND PLAN:  1.  Afib/ atypical atrial flutter The patient has symptomatic atrial arrhythmias She has failed medical therapy with tikosyn.  Further therapy is limited by post termination pauses Therapeutic strategies for afib including medicine and ablation were discussed in detail with the patient today. Risk, benefits, and alternatives to EP study and radiofrequency ablation for afib were also discussed in detail today. These risks include but are not limited to stroke, bleeding, vascular damage, tamponade, perforation, damage to the esophagus, lungs, and other structures, pulmonary vein stenosis, worsening renal function, and death. The patient understands these risk and wishes to proceed.  We will therefore  proceed with catheter ablation once the patient has been adequately anticoagulated.  Importance of compliance with anticoagulation was stressed.  TEE is planned prior to ablation  2. Sick sinus syndrome She is clear that she would like  to avoid pacing Proceed with ablation in hopes that we can control her arrhythmia that way  3. Obesity Body mass index is 35.15 kg/m. Lifestyle modification encouraged  She has medicine refractory atrial arrhythmias and is at risk for decompensation/ repeat hospitalization.  A high level of decision making was required for this encounter.   Current medicines are reviewed at length with the patient today.   The patient does not have concerns regarding her medicines.  The following changes were made today:  none   Signed, [name], MD  05/07/2017 12:09 PM     [facility] [address] [place] [address] [phone] (office) [phone] (fax)

## 2017-05-11 ENCOUNTER — Ambulatory Visit: Payer: Medicare Other | Admitting: Physical Therapy

## 2017-05-13 ENCOUNTER — Ambulatory Visit: Payer: Medicare Other | Admitting: Physical Therapy

## 2017-05-13 ENCOUNTER — Other Ambulatory Visit: Payer: Self-pay | Admitting: Internal Medicine

## 2017-05-13 DIAGNOSIS — Z1231 Encounter for screening mammogram for malignant neoplasm of breast: Secondary | ICD-10-CM

## 2017-05-17 ENCOUNTER — Other Ambulatory Visit: Payer: Self-pay | Admitting: Cardiovascular Disease

## 2017-05-17 ENCOUNTER — Ambulatory Visit: Payer: Medicare Other | Admitting: Internal Medicine

## 2017-05-18 ENCOUNTER — Encounter (HOSPITAL_COMMUNITY): Payer: Self-pay | Admitting: Cardiology

## 2017-05-18 ENCOUNTER — Ambulatory Visit (HOSPITAL_COMMUNITY)
Admission: RE | Admit: 2017-05-18 | Discharge: 2017-05-19 | Disposition: A | Discharge status: Home / Self Care | Payer: Medicare Other | Source: Ambulatory Visit | Attending: Internal Medicine | Admitting: Internal Medicine

## 2017-05-18 ENCOUNTER — Ambulatory Visit (HOSPITAL_COMMUNITY): Payer: Medicare Other | Admitting: Certified Registered Nurse Anesthetist

## 2017-05-18 ENCOUNTER — Encounter (HOSPITAL_COMMUNITY)
Admission: RE | Disposition: A | Discharge status: Home / Self Care | Payer: Self-pay | Source: Ambulatory Visit | Attending: Internal Medicine

## 2017-05-18 ENCOUNTER — Ambulatory Visit (HOSPITAL_BASED_OUTPATIENT_CLINIC_OR_DEPARTMENT_OTHER)
Admission: RE | Admit: 2017-05-18 | Discharge: 2017-05-18 | Disposition: A | Discharge status: Home / Self Care | Payer: Medicare Other | Source: Ambulatory Visit | Attending: Nurse Practitioner | Admitting: Nurse Practitioner

## 2017-05-18 DIAGNOSIS — I4891 Unspecified atrial fibrillation: Secondary | ICD-10-CM

## 2017-05-18 DIAGNOSIS — E1142 Type 2 diabetes mellitus with diabetic polyneuropathy: Secondary | ICD-10-CM | POA: Diagnosis not present

## 2017-05-18 DIAGNOSIS — Z79899 Other long term (current) drug therapy: Secondary | ICD-10-CM | POA: Diagnosis not present

## 2017-05-18 DIAGNOSIS — I129 Hypertensive chronic kidney disease with stage 1 through stage 4 chronic kidney disease, or unspecified chronic kidney disease: Secondary | ICD-10-CM | POA: Insufficient documentation

## 2017-05-18 DIAGNOSIS — F319 Bipolar disorder, unspecified: Secondary | ICD-10-CM | POA: Insufficient documentation

## 2017-05-18 DIAGNOSIS — G40909 Epilepsy, unspecified, not intractable, without status epilepticus: Secondary | ICD-10-CM | POA: Insufficient documentation

## 2017-05-18 DIAGNOSIS — Z794 Long term (current) use of insulin: Secondary | ICD-10-CM | POA: Insufficient documentation

## 2017-05-18 DIAGNOSIS — Z7901 Long term (current) use of anticoagulants: Secondary | ICD-10-CM | POA: Insufficient documentation

## 2017-05-18 DIAGNOSIS — Z23 Encounter for immunization: Secondary | ICD-10-CM | POA: Insufficient documentation

## 2017-05-18 DIAGNOSIS — E669 Obesity, unspecified: Secondary | ICD-10-CM | POA: Insufficient documentation

## 2017-05-18 DIAGNOSIS — M797 Fibromyalgia: Secondary | ICD-10-CM | POA: Insufficient documentation

## 2017-05-18 DIAGNOSIS — Z6835 Body mass index (BMI) 35.0-35.9, adult: Secondary | ICD-10-CM | POA: Diagnosis not present

## 2017-05-18 DIAGNOSIS — I480 Paroxysmal atrial fibrillation: Secondary | ICD-10-CM

## 2017-05-18 DIAGNOSIS — E785 Hyperlipidemia, unspecified: Secondary | ICD-10-CM | POA: Diagnosis not present

## 2017-05-18 DIAGNOSIS — I495 Sick sinus syndrome: Secondary | ICD-10-CM | POA: Diagnosis not present

## 2017-05-18 DIAGNOSIS — N183 Chronic kidney disease, stage 3 (moderate): Secondary | ICD-10-CM | POA: Diagnosis not present

## 2017-05-18 DIAGNOSIS — I484 Atypical atrial flutter: Secondary | ICD-10-CM | POA: Diagnosis not present

## 2017-05-18 DIAGNOSIS — E1122 Type 2 diabetes mellitus with diabetic chronic kidney disease: Secondary | ICD-10-CM | POA: Diagnosis not present

## 2017-05-18 HISTORY — DX: Paroxysmal atrial fibrillation: I48.0

## 2017-05-18 HISTORY — PX: TEE WITHOUT CARDIOVERSION: SHX5443

## 2017-05-18 HISTORY — PX: CARDIAC CATHETERIZATION: SHX172

## 2017-05-18 HISTORY — PX: ATRIAL FIBRILLATION ABLATION: EP1191

## 2017-05-18 LAB — GLUCOSE, CAPILLARY
Glucose-Capillary: 212 mg/dL — ABNORMAL HIGH (ref 65–99)
Glucose-Capillary: 149 mg/dL — ABNORMAL HIGH (ref 65–99)
Glucose-Capillary: 135 mg/dL — ABNORMAL HIGH (ref 65–99)
Glucose-Capillary: 219 mg/dL — ABNORMAL HIGH (ref 65–99)

## 2017-05-18 LAB — CBC
HEMATOCRIT: 41.9 % (ref 36.0–46.0)
HEMOGLOBIN: 13.8 g/dL (ref 12.0–15.0)
MCH: 29.2 pg (ref 26.0–34.0)
MCHC: 32.9 g/dL (ref 30.0–36.0)
MCV: 88.6 fL (ref 78.0–100.0)
Platelets: 198 10*3/uL (ref 150–400)
RBC: 4.73 MIL/uL (ref 3.87–5.11)
RDW: 13.1 % (ref 11.5–15.5)
WBC: 4.2 10*3/uL (ref 4.0–10.5)

## 2017-05-18 LAB — BASIC METABOLIC PANEL
ANION GAP: 10 (ref 5–15)
BUN: 12 mg/dL (ref 6–20)
CO2: 24 mmol/L (ref 22–32)
Calcium: 9 mg/dL (ref 8.9–10.3)
Chloride: 104 mmol/L (ref 101–111)
Creatinine, Ser: 1.4 mg/dL — ABNORMAL HIGH (ref 0.44–1.00)
GFR calc Af Amer: 45 mL/min — ABNORMAL LOW (ref >60)
GFR, EST NON AFRICAN AMERICAN: 38 mL/min — AB (ref >60)
Glucose, Bld: 223 mg/dL — ABNORMAL HIGH (ref 65–99)
POTASSIUM: 4.3 mmol/L (ref 3.5–5.1)
SODIUM: 138 mmol/L (ref 135–145)

## 2017-05-18 LAB — POCT ACTIVATED CLOTTING TIME: Activated Clotting Time: 180 seconds

## 2017-05-18 SURGERY — ATRIAL FIBRILLATION ABLATION
Anesthesia: Monitor Anesthesia Care

## 2017-05-18 SURGERY — ECHOCARDIOGRAM, TRANSESOPHAGEAL
Anesthesia: Moderate Sedation

## 2017-05-18 MED ORDER — SODIUM CHLORIDE 0.9% FLUSH
3.0000 mL | INTRAVENOUS | Status: DC | PRN
  Administered 2017-05-18: 3 mL via INTRAVENOUS
  Filled 2017-05-18: qty 3

## 2017-05-18 MED ORDER — TAMSULOSIN HCL 0.4 MG PO CAPS
0.4000 mg | ORAL_CAPSULE | Freq: Every day | ORAL | Status: DC
  Administered 2017-05-18: 0.4 mg via ORAL
  Filled 2017-05-18: qty 1

## 2017-05-18 MED ORDER — RIVAROXABAN 20 MG PO TABS
20.0000 mg | ORAL_TABLET | Freq: Every day | ORAL | Status: DC
  Administered 2017-05-18: 20 mg via ORAL
  Filled 2017-05-18: qty 1

## 2017-05-18 MED ORDER — BUPIVACAINE HCL (PF) 0.25 % IJ SOLN
INTRAMUSCULAR | Status: DC | PRN
  Administered 2017-05-18: 30 mL

## 2017-05-18 MED ORDER — HEPARIN SODIUM (PORCINE) 1000 UNIT/ML IJ SOLN
INTRAMUSCULAR | Status: DC | PRN
  Administered 2017-05-18: 2000 [IU] via INTRAVENOUS
  Administered 2017-05-18: 3000 [IU] via INTRAVENOUS
  Administered 2017-05-18: 12000 [IU] via INTRAVENOUS

## 2017-05-18 MED ORDER — BUTAMBEN-TETRACAINE-BENZOCAINE 2-2-14 % EX AERO
INHALATION_SPRAY | CUTANEOUS | Status: DC | PRN
  Administered 2017-05-18: 2 via TOPICAL

## 2017-05-18 MED ORDER — SODIUM CHLORIDE 0.9 % IV SOLN
INTRAVENOUS | Status: DC | PRN
  Administered 2017-05-18: 09:00:00 via INTRAVENOUS

## 2017-05-18 MED ORDER — PREGABALIN 100 MG PO CAPS
100.0000 mg | ORAL_CAPSULE | Freq: Three times a day (TID) | ORAL | Status: DC
  Administered 2017-05-18 – 2017-05-19 (×2): 100 mg via ORAL
  Filled 2017-05-18 (×2): qty 1

## 2017-05-18 MED ORDER — HEPARIN (PORCINE) IN NACL 2-0.9 UNIT/ML-% IJ SOLN
INTRAMUSCULAR | Status: AC | PRN
  Administered 2017-05-18: 500 mL

## 2017-05-18 MED ORDER — INSULIN GLARGINE 100 UNIT/ML ~~LOC~~ SOLN
60.0000 [IU] | Freq: Every day | SUBCUTANEOUS | Status: DC
  Administered 2017-05-18: 60 [IU] via SUBCUTANEOUS
  Filled 2017-05-18: qty 0.6

## 2017-05-18 MED ORDER — HEPARIN (PORCINE) IN NACL 2-0.9 UNIT/ML-% IJ SOLN
INTRAMUSCULAR | Status: AC
  Filled 2017-05-18: qty 500

## 2017-05-18 MED ORDER — ACETAMINOPHEN 325 MG PO TABS
650.0000 mg | ORAL_TABLET | ORAL | Status: DC | PRN
  Administered 2017-05-19: 650 mg via ORAL
  Filled 2017-05-18: qty 2

## 2017-05-18 MED ORDER — PROPOFOL 10 MG/ML IV BOLUS
INTRAVENOUS | Status: DC | PRN
  Administered 2017-05-18: 20 mg via INTRAVENOUS
  Administered 2017-05-18: 10 mg via INTRAVENOUS
  Administered 2017-05-18: 15 mg via INTRAVENOUS
  Administered 2017-05-18: 20 mg via INTRAVENOUS

## 2017-05-18 MED ORDER — ONDANSETRON HCL 4 MG/2ML IJ SOLN
INTRAMUSCULAR | Status: DC | PRN
  Administered 2017-05-18: 4 mg via INTRAVENOUS

## 2017-05-18 MED ORDER — MIDAZOLAM HCL 10 MG/2ML IJ SOLN
INTRAMUSCULAR | Status: DC | PRN
  Administered 2017-05-18 (×4): 2 mg via INTRAVENOUS

## 2017-05-18 MED ORDER — MAGNESIUM OXIDE 400 (241.3 MG) MG PO TABS
400.0000 mg | ORAL_TABLET | Freq: Two times a day (BID) | ORAL | Status: DC
  Administered 2017-05-18 – 2017-05-19 (×2): 400 mg via ORAL
  Filled 2017-05-18 (×5): qty 1

## 2017-05-18 MED ORDER — PROTAMINE SULFATE 10 MG/ML IV SOLN
INTRAVENOUS | Status: DC | PRN
  Administered 2017-05-18: 30 mg via INTRAVENOUS

## 2017-05-18 MED ORDER — OFF THE BEAT BOOK
Freq: Once | Status: AC
  Administered 2017-05-18: 17:00:00
  Filled 2017-05-18: qty 1

## 2017-05-18 MED ORDER — ISOPROTERENOL HCL 0.2 MG/ML IJ SOLN
INTRAMUSCULAR | Status: AC
  Filled 2017-05-18: qty 5

## 2017-05-18 MED ORDER — INSULIN LISPRO 100 UNIT/ML (KWIKPEN): 18.0000 [IU] | PEN_INJECTOR | Freq: Three times a day (TID) | SUBCUTANEOUS | Status: DC

## 2017-05-18 MED ORDER — ALBUTEROL SULFATE HFA 108 (90 BASE) MCG/ACT IN AERS: 2.0000 | INHALATION_SPRAY | RESPIRATORY_TRACT | Status: DC | PRN

## 2017-05-18 MED ORDER — METOPROLOL TARTRATE 50 MG PO TABS
50.0000 mg | ORAL_TABLET | Freq: Two times a day (BID) | ORAL | Status: DC
  Administered 2017-05-18 – 2017-05-19 (×2): 50 mg via ORAL
  Filled 2017-05-18 (×2): qty 1

## 2017-05-18 MED ORDER — INFLUENZA VAC SPLIT HIGH-DOSE 0.5 ML IM SUSY
0.5000 mL | PREFILLED_SYRINGE | INTRAMUSCULAR | Status: AC
  Administered 2017-05-19: 0.5 mL via INTRAMUSCULAR
  Filled 2017-05-18: qty 0.5

## 2017-05-18 MED ORDER — IOPAMIDOL (ISOVUE-370) INJECTION 76%
INTRAVENOUS | Status: AC
  Filled 2017-05-18: qty 50

## 2017-05-18 MED ORDER — SODIUM CHLORIDE 0.9 % IV SOLN: INTRAVENOUS | Status: DC

## 2017-05-18 MED ORDER — DULOXETINE HCL 60 MG PO CPEP
60.0000 mg | ORAL_CAPSULE | Freq: Every day | ORAL | Status: DC
  Administered 2017-05-18: 60 mg via ORAL
  Filled 2017-05-18: qty 1

## 2017-05-18 MED ORDER — POTASSIUM CHLORIDE ER 10 MEQ PO TBCR
10.0000 meq | EXTENDED_RELEASE_TABLET | Freq: Every day | ORAL | Status: DC
  Administered 2017-05-18 – 2017-05-19 (×2): 10 meq via ORAL
  Filled 2017-05-18 (×4): qty 1

## 2017-05-18 MED ORDER — PROPOFOL 500 MG/50ML IV EMUL
INTRAVENOUS | Status: DC | PRN
  Administered 2017-05-18: 50 ug/kg/min via INTRAVENOUS

## 2017-05-18 MED ORDER — LACTATED RINGERS IV SOLN
INTRAVENOUS | Status: DC | PRN
  Administered 2017-05-18: 12:00:00 via INTRAVENOUS

## 2017-05-18 MED ORDER — PHENYLEPHRINE HCL 10 MG/ML IJ SOLN
INTRAVENOUS | Status: DC | PRN
  Administered 2017-05-18: 15 ug/min via INTRAVENOUS

## 2017-05-18 MED ORDER — ISOPROTERENOL HCL 0.2 MG/ML IJ SOLN
INTRAVENOUS | Status: DC | PRN
  Administered 2017-05-18: 20 ug/min via INTRAVENOUS

## 2017-05-18 MED ORDER — DOFETILIDE 250 MCG PO CAPS
250.0000 ug | ORAL_CAPSULE | Freq: Two times a day (BID) | ORAL | Status: DC
  Administered 2017-05-18 – 2017-05-19 (×2): 250 ug via ORAL
  Filled 2017-05-18 (×2): qty 1

## 2017-05-18 MED ORDER — SODIUM CHLORIDE 0.9% FLUSH: 3.0000 mL | Freq: Two times a day (BID) | INTRAVENOUS | Status: DC

## 2017-05-18 MED ORDER — INSULIN ASPART 100 UNIT/ML ~~LOC~~ SOLN: 18.0000 [IU] | Freq: Three times a day (TID) | SUBCUTANEOUS | Status: DC

## 2017-05-18 MED ORDER — FENTANYL CITRATE (PF) 100 MCG/2ML IJ SOLN
INTRAMUSCULAR | Status: AC
  Filled 2017-05-18: qty 2

## 2017-05-18 MED ORDER — IOPAMIDOL (ISOVUE-370) INJECTION 76%
INTRAVENOUS | Status: DC | PRN
  Administered 2017-05-18: 3 mL via INTRAVENOUS

## 2017-05-18 MED ORDER — HEPARIN SODIUM (PORCINE) 1000 UNIT/ML IJ SOLN
INTRAMUSCULAR | Status: DC | PRN
  Administered 2017-05-18 (×2): 1000 [IU] via INTRAVENOUS

## 2017-05-18 MED ORDER — FENTANYL CITRATE (PF) 100 MCG/2ML IJ SOLN
INTRAMUSCULAR | Status: DC | PRN
  Administered 2017-05-18 (×2): 25 ug via INTRAVENOUS

## 2017-05-18 MED ORDER — ONDANSETRON HCL 4 MG/2ML IJ SOLN: 4.0000 mg | Freq: Four times a day (QID) | INTRAMUSCULAR | Status: DC | PRN

## 2017-05-18 MED ORDER — SODIUM CHLORIDE 0.9 % IV SOLN
INTRAVENOUS | Status: DC
  Administered 2017-05-18: 09:00:00 via INTRAVENOUS

## 2017-05-18 MED ORDER — INSULIN GLARGINE 100 UNIT/ML SOLOSTAR PEN
30.0000 [IU] | PEN_INJECTOR | Freq: Every day | SUBCUTANEOUS | Status: DC
Start: 2017-05-18 — End: 2017-05-18

## 2017-05-18 MED ORDER — HEPARIN SODIUM (PORCINE) 1000 UNIT/ML IJ SOLN
INTRAMUSCULAR | Status: AC
  Filled 2017-05-18: qty 1

## 2017-05-18 MED ORDER — INSULIN ASPART 100 UNIT/ML ~~LOC~~ SOLN
0.0000 [IU] | Freq: Three times a day (TID) | SUBCUTANEOUS | Status: DC
Start: 2017-05-19 — End: 2017-05-19
  Administered 2017-05-19 (×2): 4 [IU] via SUBCUTANEOUS

## 2017-05-18 MED ORDER — ALBUTEROL SULFATE (2.5 MG/3ML) 0.083% IN NEBU: 2.5000 mg | INHALATION_SOLUTION | RESPIRATORY_TRACT | Status: DC | PRN

## 2017-05-18 MED ORDER — SODIUM CHLORIDE 0.9 % IV SOLN: 250.0000 mL | INTRAVENOUS | Status: DC | PRN

## 2017-05-18 MED ORDER — OXYCODONE-ACETAMINOPHEN 5-325 MG PO TABS
1.0000 | ORAL_TABLET | Freq: Four times a day (QID) | ORAL | Status: DC | PRN
  Administered 2017-05-18 – 2017-05-19 (×3): 2 via ORAL
  Filled 2017-05-18 (×3): qty 2

## 2017-05-18 MED ORDER — INSULIN GLARGINE 100 UNIT/ML ~~LOC~~ SOLN
30.0000 [IU] | Freq: Every day | SUBCUTANEOUS | Status: DC
  Filled 2017-05-18: qty 0.3

## 2017-05-18 MED ORDER — BUPIVACAINE HCL (PF) 0.25 % IJ SOLN
INTRAMUSCULAR | Status: AC
  Filled 2017-05-18: qty 30

## 2017-05-18 MED ORDER — FENTANYL CITRATE (PF) 100 MCG/2ML IJ SOLN
INTRAMUSCULAR | Status: DC | PRN
  Administered 2017-05-18 (×6): 25 ug via INTRAVENOUS

## 2017-05-18 MED ORDER — MIDAZOLAM HCL 5 MG/ML IJ SOLN
INTRAMUSCULAR | Status: AC
  Filled 2017-05-18: qty 2

## 2017-05-18 SURGICAL SUPPLY — 19 items
BAG SNAP BAND KOVER 36X36 (MISCELLANEOUS) ×2 IMPLANT
BLANKET WARM UNDERBOD FULL ACC (MISCELLANEOUS) ×3 IMPLANT
CATH MAPPNG PENTARAY F 2-6-2MM (CATHETERS) IMPLANT
CATH NAVISTAR SMARTTOUCH DF (ABLATOR) ×2 IMPLANT
CATH SOUNDSTAR ECO REPROCESSED (CATHETERS) ×2 IMPLANT
CATH WEBSTER BI DIR CS D-F CRV (CATHETERS) ×2 IMPLANT
COVER SWIFTLINK CONNECTOR (BAG) ×3 IMPLANT
NDL TRANSEP BRK 71CM 407200 (NEEDLE) IMPLANT
NEEDLE TRANSEP BRK 71CM 407200 (NEEDLE) ×3 IMPLANT
PACK EP LATEX FREE (CUSTOM PROCEDURE TRAY) ×3
PACK EP LF (CUSTOM PROCEDURE TRAY) ×1 IMPLANT
PAD DEFIB LIFELINK (PAD) ×3 IMPLANT
PATCH CARTO3 (PAD) ×2 IMPLANT
PENTARAY F 2-6-2MM (CATHETERS) ×3
SHEATH AVANTI 11F 11CM (SHEATH) ×2 IMPLANT
SHEATH PINNACLE 7F 10CM (SHEATH) ×4 IMPLANT
SHEATH PINNACLE 9F 10CM (SHEATH) ×2 IMPLANT
SHEATH SWARTZ TS SL2 63CM 8.5F (SHEATH) ×2 IMPLANT
TUBING COOLFLOW (TUBING) ×2 IMPLANT

## 2017-05-18 NOTE — Progress Notes (Signed)
    Transesophageal Echocardiogram Note  [name] [id number] 1951/04/23  Procedure: Transesophageal Echocardiogram Indications: atrial fibrillation   Procedure Details Consent: Obtained Time Out: Verified patient identification, verified procedure, site/side was marked, verified correct patient position, special equipment/implants available, Radiology Safety Procedures followed,  medications/allergies/relevent history reviewed, required imaging and test results available.  Performed  Medications:  During this procedure the patient is administered a total of Versed 8 mg and Fentanyl 50 mcg  to achieve and maintain moderate conscious sedation.  The patient's heart rate, blood pressure, and oxygen saturation are monitored continuously during the procedure. The period of conscious sedation is 30 minutes, of which I was present face-to-face 100% of this time.  Normal LV function; no LAA thrombus Full report to follow   Complications: No apparent complications Patient did tolerate procedure well.  Olga Millers, MD

## 2017-05-18 NOTE — H&P (View-Only) (Signed)
Electrophysiology Office Note   Date:  05/07/2017   ID:  [name], DOB [birth year], MRN [id number]  PCP:  [name], MD  Cardiologist:  Dr [name] Primary Electrophysiologist: [name], MD    CC: atrial arrhythmias   History of Present Illness: [name] is a 66 y.o. female who presents today for electrophysiology evaluation.   The patient has symptomatic recurrent atrial arrhythmias despite tikosyn.  She presented to the ED yesterday (my note reviewed) with tachypalpitations and was found to have afib.  She also has post termination pauses which limit medical therapy.  She has done reasonably well since her ED visit yesterday.  Today, she denies symptoms of chest pain, shortness of breath, orthopnea, PND, lower extremity edema, claudication, dizziness, presyncope, syncope, bleeding, or neurologic sequela. The patient is tolerating medications without difficulties and is otherwise without complaint today.    Past Medical History:  Diagnosis Date  . Anxiety   . Arthritis    "knees, hands, ankles, feet" (05/12/2016)  . Asthma    Dr. [name]  . Atrial fibrillation (HCC)   . Bipolar disorder (HCC)   . Chronic back pain    "lower and middle" (05/12/2016)  . CKD (chronic kidney disease), stage III   . Colon polyps   . Depression   . Diverticulosis   . Epilepsy (HCC)   . Essential hypertension   . Fibromyalgia   . Gallstones   . GERD (gastroesophageal reflux disease)   . H/O hiatal hernia   . Hyperlipidemia   . IBS (irritable bowel syndrome)   . Paroxysmal A-fib (HCC)    a. On [name]. b. Recurrence 08/2014 in setting of GI illness.  . Peripheral neuropathy   . Sarcoidosis of lung (HCC)    Dr. [name]  . Seizures (HCC)    "epileptic; pretty regular recently" (05/12/2016)  . Type II diabetes mellitus (HCC)    Past Surgical History:  Procedure Laterality Date  . ABDOMINAL HYSTERECTOMY  1995  . ANTERIOR CERVICAL DECOMP/DISCECTOMY FUSION  07/11/2012   Procedure: ANTERIOR CERVICAL DECOMPRESSION/DISCECTOMY FUSION 1 LEVEL;  Surgeon: [name], MD;  Location: [facility];  Service: Neurosurgery;  Laterality: N/A;  Cervical Five-Six Anterior Cervical Decompression with Fusion Interbody Prothesis Plating and Bonegraft  . CARDIAC CATHETERIZATION  2012   a. Normal coronaries 2012. b. Normal nuc 09/2014.  Marland Kitchen CHOLECYSTECTOMY OPEN  1976  . CLOSED REDUCTION ANKLE FRACTURE Left 10/2006   "got steel rod in my leg; and screws"  . COLON SURGERY    . DILATION AND CURETTAGE OF UTERUS    . ESOPHAGEAL MANOMETRY  03/21/2012   Procedure: ESOPHAGEAL MANOMETRY (EM);  Surgeon: [name], MD;  Location: [facility];  Service: Endoscopy;  Laterality: N/A;  . FRACTURE SURGERY    . NEUROPLASTY / TRANSPOSITION MEDIAN NERVE AT CARPAL TUNNEL Left 2004  . NEUROPLASTY / TRANSPOSITION MEDIAN NERVE AT CARPAL TUNNEL Right 2002  . 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 SALPINGOOPHORECTOMY Bilateral 2000  . TUBAL LIGATION  1982     Current Outpatient Prescriptions  Medication Sig Dispense Refill  . atorvastatin (LIPITOR) 80 MG tablet Take 80 mg by mouth at bedtime.  0  . calcium carbonate (OS-CAL) 600 MG TABS tablet Take 600 mg by mouth daily.    . cholecalciferol (VITAMIN D) 1000 UNITS tablet Take 1,000 Units by mouth 2 (two) times daily.     . Cranberry 500 MG CAPS Take 500 mg by mouth 2 (two) times daily.    Marland Kitchen  dofetilide ([name]) 250 MCG capsule take 1 capsule by mouth twice a day (Patient taking differently: Take 250 mcg by mouth two times a day) 180 capsule 0  . DULERA 100-5 MCG/ACT AERO inhale 2 puffs INTO THE LUNGS every 12 hours (Patient taking differently: Inhale 2 puffs into the lungs every 12 hours) 13 g 11  . DULoxetine (CYMBALTA) 60 MG capsule Take 60 mg by mouth at bedtime.     Marland Kitchen EVZIO 0.4 MG/0.4ML SOAJ Inject 0.4 mLs as directed daily as needed (for overdose).   0  . fluticasone (FLONASE) 50 MCG/ACT nasal spray instill 2 sprays into each  nostril once daily 16 g 6  . HUMALOG KWIKPEN 100 UNIT/ML KiwkPen Inject 18-22 Units into the skin 3 (three) times daily with meals. PER SLIDING SCALE  0  . Insulin Glargine (LANTUS SOLOSTAR) 100 UNIT/ML Solostar Pen Inject 30 Units into the skin at bedtime. (Patient taking differently: Inject 60 Units into the skin at bedtime. )    . loratadine (CLARITIN) 10 MG tablet take 1 tablet by mouth once daily (Patient taking differently: Take 10 mg by mouth once a day) 30 tablet 1  . magnesium oxide (MAG-OX) 400 MG tablet Take 1 tablet (400 mg total) by mouth 2 (two) times daily. (Patient taking differently: Take 400 mg by mouth daily. ) 60 tablet 11  . metoprolol tartrate (LOPRESSOR) 50 MG tablet Take 1 tablet (50 mg total) by mouth 2 (two) times daily. 270 tablet 3  . NEXIUM 40 MG capsule take 1 capsule by mouth twice a day BEFORE A MEAL (Patient taking differently: Take 40 mg by mouth two times a day before meals) 60 capsule 6  . nitroGLYCERIN (NITROSTAT) 0.4 MG SL tablet Place 1 tablet (0.4 mg total) under the tongue every 5 (five) minutes as needed for chest pain (do nto exceed 3 doses). (Patient taking differently: Place 0.4 mg under the tongue every 5 (five) minutes x 3 doses as needed for chest pain. ) 25 tablet 2  . ondansetron (ZOFRAN ODT) 4 MG disintegrating tablet Take 1 tablet (4 mg total) by mouth every 8 (eight) hours as needed for nausea or vomiting. 10 tablet 0  . oxyCODONE-acetaminophen (PERCOCET) 10-325 MG tablet Take 1 tablet by mouth 5 (five) times daily. (scheduled)     . Polyvinyl Alcohol-Povidone (REFRESH OP) Place 1 drop into both eyes 3 (three) times daily as needed (for dry eyes).     . potassium chloride (K-DUR) 10 MEQ tablet take 1 tablet by mouth once daily (Patient taking differently: Take 10 mEq by mouth once a day) 90 tablet 3  . pregabalin (LYRICA) 100 MG capsule Take 100 mg by mouth 3 (three) times daily.    Marland Kitchen PROAIR HFA 108 (90 Base) MCG/ACT inhaler inhale 2 puffs every 6  hours if needed for wheezing or shortness of breath (Patient taking differently: Inhale 2 puffs into the lungs every 6 hours as needed for wheezing or shortness of breath) 8.5 g 1  . tamsulosin (FLOMAX) 0.4 MG CAPS capsule Take 0.4 mg by mouth at bedtime.  0  . vitamin C (ASCORBIC ACID) 500 MG tablet Take 500 mg by mouth 2 (two) times daily.    . VOLTAREN 1 % GEL APPLY 2 INCHES TO AFFECTED AREA FOUR TIMES A DAY 500 g 3  . XARELTO 20 MG TABS tablet take 1 tablet by mouth once daily with SUPPER (Patient taking differently: Take 20 mg by mouth once a day with supper) 30 tablet 11  No current facility-administered medications for this visit.     Allergies:   Prednisone; Amitriptyline; and Hydromorphone hcl   Social History:  The patient  reports that she has never smoked. She has never used smokeless tobacco. She reports that she does not drink alcohol or use drugs.   Family History:  The patient's  family history includes Alcohol abuse in her brother, father, and sister; Alzheimer's disease in her mother; Colon cancer in her maternal aunt; Depression in her brother, mother, and sister; Diabetes in her brother, maternal grandfather, maternal grandmother, mother, sister, and unknown relative; Drug abuse in her brother and sister; Heart attack in her father and mother; Heart attack (age of onset: 53) in her brother; Hyperlipidemia in her brother, father, maternal grandfather, maternal grandmother, mother, and sister; Hypertension in her brother, father, mother, sister, and unknown relative; Kidney disease in her brother and sister; Lupus in her sister; Mental illness in her mother; Ovarian cancer in her cousin and maternal aunt; Prostate cancer in her maternal grandfather.    ROS:  Please see the history of present illness.   All other systems are personally reviewed and negative.    PHYSICAL EXAM: VS:  BP 122/84   Pulse (!) 128   Ht  (1.676 m)   Wt 217 lb 12.8 oz (98.8 kg)   BMI 35.15 kg/m   , BMI Body mass index is 35.15 kg/m. GEN: Well nourished, well developed, in no acute distress  HEENT: normal  Neck: no JVD, carotid bruits, or masses Cardiac: tachycardic irregular rhythm, no murmurs, rubs, or gallops,no edema  Respiratory:  clear to auscultation bilaterally, normal work of breathing GI: soft, nontender, nondistended, + BS MS: no deformity or atrophy  Skin: warm and dry  Neuro:  Strength and sensation are intact Psych: euthymic mood, full affect  EKG:  EKG is ordered today. The ekg ordered today is personally reviewed and shows atypical atrial flutter 128 bpm   Recent Labs: 12/01/2016: ALT 16 03/01/2017: TSH 1.110 04/20/2017: Magnesium 1.8 05/06/2017: BUN 12; Creatinine, Ser 1.26; Hemoglobin 13.4; Platelets 174; Potassium 3.3; Sodium 139  personally reviewed   Lipid Panel     Component Value Date/Time   CHOL 159 07/26/2015 1449   TRIG 530 (H) 07/26/2015 1449   HDL 37 (L) 07/26/2015 1449   CHOLHDL 4.3 07/26/2015 1449   VLDL NOT CALC 07/26/2015 1449   LDLCALC NOT CALC 07/26/2015 1449   personally reviewed   Wt Readings from Last 3 Encounters:  05/07/17 217 lb 12.8 oz (98.8 kg)  04/20/17 214 lb (97.1 kg)  04/01/17 213 lb 3.2 oz (96.7 kg)     ASSESSMENT AND PLAN:  1.  Afib/ atypical atrial flutter The patient has symptomatic atrial arrhythmias She has failed medical therapy with tikosyn.  Further therapy is limited by post termination pauses Therapeutic strategies for afib including medicine and ablation were discussed in detail with the patient today. Risk, benefits, and alternatives to EP study and radiofrequency ablation for afib were also discussed in detail today. These risks include but are not limited to stroke, bleeding, vascular damage, tamponade, perforation, damage to the esophagus, lungs, and other structures, pulmonary vein stenosis, worsening renal function, and death. The patient understands these risk and wishes to proceed.  We will therefore  proceed with catheter ablation once the patient has been adequately anticoagulated.  Importance of compliance with anticoagulation was stressed.  TEE is planned prior to ablation  2. Sick sinus syndrome She is clear that she would like  to avoid pacing Proceed with ablation in hopes that we can control her arrhythmia that way  3. Obesity Body mass index is 35.15 kg/m. Lifestyle modification encouraged  She has medicine refractory atrial arrhythmias and is at risk for decompensation/ repeat hospitalization.  A high level of decision making was required for this encounter.   Current medicines are reviewed at length with the patient today.   The patient does not have concerns regarding her medicines.  The following changes were made today:  none   Signed, [name], MD  05/07/2017 12:09 PM     [facility] [address] [place] [address] [phone] (office) [phone] (fax)

## 2017-05-18 NOTE — Interval H&P Note (Signed)
History and Physical Interval Note:  05/18/2017 8:44 AM  [name]  has presented today for surgery, with the diagnosis of afib  The various methods of treatment have been discussed with the patient and family. After consideration of risks, benefits and other options for treatment, the patient has consented to  Procedure(s): Atrial Fibrillation Ablation (N/A) as a surgical intervention .  The patient's history has been reviewed, patient examined, no change in status, stable for surgery.  I have reviewed the patient's chart and labs.  Questions were answered to the patient's satisfaction.     [name]

## 2017-05-18 NOTE — Discharge Instructions (Signed)
No driving for 4 days. No lifting over 5 lbs for 1 week. No sexual activity for 1 week. You may return to work in 1 week. Keep procedure site clean & dry. If you notice increased pain, swelling, bleeding or pus, call/return!  You may shower, but no soaking baths/hot tubs/pools for 1 week.  ° ° °You have an appointment set up with the [facility].  Multiple studies have shown that being followed by a dedicated atrial fibrillation clinic in addition to the standard care you receive from your other physicians improves health. We believe that enrollment in the atrial fibrillation clinic will allow us to better care for you.  ° °The phone number to the [facility] Clinic is [phone]. The clinic is staffed Monday through Friday from 8:30am to 5pm. ° °Parking Directions: The clinic is located in the [facility] and Vascular Building connected to [facility]. °1)From [address] turn on to [address] and go to the 3rd entrance  (Heart and Vascular entrance) on the right. °2)Look to the right for Heart &Vascular Parking Garage. °3)A code for the entrance is required please call the clinic to receive this.   °4)Take the elevators to the 1st floor. Registration is in the room with the glass walls at the end of the hallway. ° °If you have any trouble parking or locating the clinic, please don’t hesitate to call [phone]. ° ° °

## 2017-05-18 NOTE — Interval H&P Note (Signed)
History and Physical Interval Note:  05/18/2017 7:32 AM  [name]  has presented today for surgery, with the diagnosis of afib  The various methods of treatment have been discussed with the patient and family. After consideration of risks, benefits and other options for treatment, the patient has consented to  Procedure(s): TRANSESOPHAGEAL ECHOCARDIOGRAM (TEE) (N/A) as a surgical intervention .  The patient's history has been reviewed, patient examined, no change in status, stable for surgery.  I have reviewed the patient's chart and labs.  Questions were answered to the patient's satisfaction.     Olga Millers

## 2017-05-18 NOTE — Anesthesia Preprocedure Evaluation (Addendum)
Anesthesia Evaluation  Patient identified by MRN, date of birth, ID band Patient awake    Reviewed: Allergy & Precautions, NPO status , Patient's Chart, lab work & pertinent test results  Airway Mallampati: III  TM Distance: >3 FB Neck ROM: Full    Dental  (+) Dental Advisory Given, Poor Dentition, Missing, Chipped   Pulmonary neg shortness of breath, asthma , neg sleep apnea, neg recent URI, neg PE   breath sounds clear to auscultation       Cardiovascular hypertension, Pt. on medications + dysrhythmias Atrial Fibrillation  Rhythm:Irregular     Neuro/Psych PSYCHIATRIC DISORDERS Anxiety Depression Bipolar Disorder  Neuromuscular disease    GI/Hepatic Neg liver ROS, hiatal hernia, GERD  Controlled and Medicated,  Endo/Other  diabetes, Type 2Morbid obesity  Renal/GU Renal InsufficiencyRenal disease     Musculoskeletal  (+) Arthritis , Fibromyalgia -  Abdominal   Peds  Hematology   Anesthesia Other Findings   Reproductive/Obstetrics                           Anesthesia Physical Anesthesia Plan  ASA: III  Anesthesia Plan: MAC   Post-op Pain Management:    Induction: Intravenous  PONV Risk Score and Plan: 2 and Ondansetron and Treatment may vary due to age or medical condition  Airway Management Planned: Nasal Cannula  Additional Equipment:   Intra-op Plan:   Post-operative Plan:   Informed Consent: I have reviewed the patients History and Physical, chart, labs and discussed the procedure including the risks, benefits and alternatives for the proposed anesthesia with the patient or authorized representative who has indicated his/her understanding and acceptance.   Dental advisory given  Plan Discussed with: CRNA, Anesthesiologist and Surgeon  Anesthesia Plan Comments:        Anesthesia Quick Evaluation

## 2017-05-18 NOTE — Discharge Summary (Signed)
ELECTROPHYSIOLOGY PROCEDURE DISCHARGE SUMMARY    Patient ID: [name],  MRN: [id number], DOB/AGE: 12-04-1950 66 y.o.  Admit date: 05/18/2017 Discharge date: 05/19/2017  Primary Care Physician: [name], MD Primary Cardiologist: [name] Electrophysiologist: [name], MD  Primary Discharge Diagnosis:  Persistent atrial fibrillation and atypical flutter s/p ablation this admission  Secondary Discharge Diagnosis:  1.  HTN 2.  Diabetes 3.  Seizures 4.  Depression 5.  Fibromyalgia  Procedures This Admission:  1.  Electrophysiology study and radiofrequency catheter ablation on 05/18/17 by Dr [name].  This study demonstrated AF upon presentation; intracardiac echo reveals a moderate sized left atrium with four separate pulmonary veins without evidence of pulmonary vein stenosis; successful electrical isolation and anatomical encircling of all four pulmonary veins with radiofrequency current; cavo-tricuspid isthmus ablation was performed; no inducible arrhythmias following ablation both on and off of Isuprel; no early apparent complications.    Brief HPI: [name] is a 66 y.o. female with a history of persistent atrial fibrillation.  They have failed medical therapy with [name]. Risks, benefits, and alternatives to catheter ablation of atrial fibrillation were reviewed with the patient who wished to proceed.  The patient underwent TEE prior to the procedure which demonstrated normal LV function and no LAA thrombus.    Hospital Course:  The patient was admitted and underwent EPS/RFCA of atrial fibrillation with details as outlined above.  They were monitored on telemetry overnight which demonstrated sinus rhythm.  Groin was without complication on the day of discharge.  The patient was examined and considered to be stable for discharge.  Wound care and restrictions were reviewed with the patient.  The patient will be seen back by Rudi Coco, NP in 4 weeks  and Dr Johney Frame in 12 weeks for post ablation follow up.   This patients CHA2DS2-VASc Score and unadjusted Ischemic Stroke Rate (% per year) is equal to 4.8 % stroke rate/year from a score of 4 Above score calculated as 1 point each if present [CHF, HTN, DM, Vascular=MI/PAD/Aortic Plaque, Age if 58-74, or Female] Above score calculated as 2 points each if present [Age > 75, or Stroke/TIA/TE]   Physical Exam: Vitals:   05/19/17 0033 05/19/17 0345 05/19/17 0425 05/19/17 0700  BP: (!) 122/57 (!) 118/52  (!) 125/54  Pulse: 60 62 62 (!) 57  Resp:  14  10  Temp: 97.9 F (36.6 C) 97.9 F (36.6 C)  97.8 F (36.6 C)  TempSrc: Oral Oral  Oral  SpO2: 98% 97% 98% 93%  Weight:  213 lb 13.5 oz (97 kg)    Height:        GEN- The patient is well appearing, alert and oriented x 3 today.   HEENT: normocephalic, atraumatic; sclera clear, conjunctiva pink; hearing intact; oropharynx clear; neck supple  Lungs- Clear to ausculation bilaterally, normal work of breathing.  No wheezes, rales, rhonchi Heart- Regular rate and rhythm, no murmurs, rubs or gallops  GI- soft, non-tender, non-distended, bowel sounds present  Extremities- no clubbing, cyanosis, or edema; DP/PT/radial pulses 2+ bilaterally, groin without hematoma/bruit MS- no significant deformity or atrophy Skin- warm and dry, no rash or lesion Psych- euthymic mood, full affect Neuro- strength and sensation are intact   Labs:   Lab Results  Component Value Date   WBC 4.2 05/18/2017   HGB 13.8 05/18/2017   HCT 41.9 05/18/2017   MCV 88.6 05/18/2017   PLT 198 05/18/2017     Recent Labs Lab 05/19/17 0334  NA 137  K 4.7  CL 105  CO2 24  BUN 15  CREATININE 1.29*  CALCIUM 8.7*  GLUCOSE 202*     Discharge Medications:  Allergies as of 05/19/2017      Reactions   Prednisone Other (See Comments)   SENT PATIENT INTO A-FIB    Amitriptyline Other (See Comments)   Made the patient disoriented   Hydromorphone Hcl Other (See Comments)    Made the blood pressure drop (HYPOtension)      Medication List    TAKE these medications   atorvastatin 80 MG tablet Commonly known as:  LIPITOR Take 80 mg by mouth at bedtime.   calcium carbonate 600 MG Tabs tablet Commonly known as:  OS-CAL Take 600 mg by mouth daily.   cholecalciferol 1000 units tablet Commonly known as:  VITAMIN D Take 1,000 Units by mouth 2 (two) times daily.   Cranberry 500 MG Caps Take 500 mg by mouth 2 (two) times daily.   dofetilide 250 MCG capsule Commonly known as:  [name] take 1 capsule by mouth twice a day   DULERA 100-5 MCG/ACT Aero Generic drug:  mometasone-formoterol inhale 2 puffs INTO THE LUNGS every 12 hours   DULoxetine 60 MG capsule Commonly known as:  CYMBALTA Take 60 mg by mouth at bedtime.   EVZIO 0.4 MG/0.4ML Soaj Generic drug:  Naloxone HCl Inject 0.4 mLs as directed daily as needed (for overdose).   fluticasone 50 MCG/ACT nasal spray Commonly known as:  FLONASE instill 2 sprays into each nostril once daily   HUMALOG KWIKPEN 100 UNIT/ML KiwkPen Generic drug:  insulin lispro Inject 18-32 Units into the skin 3 (three) times daily with meals. PER Patient home SLIDING SCALE 18 units if CBG < 200 32 units  If CBG  > 200   Insulin Glargine 100 UNIT/ML Solostar Pen Commonly known as:  LANTUS SOLOSTAR Inject 30 Units into the skin at bedtime. What changed:  how much to take   loratadine 10 MG tablet Commonly known as:  CLARITIN take 1 tablet by mouth once daily What changed:  See the new instructions.   magnesium oxide 400 MG tablet Commonly known as:  MAG-OX Take 1 tablet (400 mg total) by mouth 2 (two) times daily. What changed:  when to take this   metoprolol tartrate 50 MG tablet Commonly known as:  LOPRESSOR Take 1 tablet (50 mg total) by mouth 2 (two) times daily.   NEXIUM 40 MG capsule Generic drug:  esomeprazole take 1 capsule by mouth twice a day BEFORE A MEAL What changed:  See the new instructions.    nitroGLYCERIN 0.4 MG SL tablet Commonly known as:  NITROSTAT Place 1 tablet (0.4 mg total) under the tongue every 5 (five) minutes as needed for chest pain (do nto exceed 3 doses). What changed:  when to take this  reasons to take this   ondansetron 4 MG disintegrating tablet Commonly known as:  ZOFRAN ODT Take 1 tablet (4 mg total) by mouth every 8 (eight) hours as needed for nausea or vomiting.   PERCOCET 10-325 MG tablet Generic drug:  oxyCODONE-acetaminophen Take 1 tablet by mouth 5 (five) times daily. (scheduled)   potassium chloride 10 MEQ tablet Commonly known as:  K-DUR take 1 tablet by mouth once daily What changed:  See the new instructions.   pregabalin 100 MG capsule Commonly known as:  LYRICA Take 100 mg by mouth 3 (three) times daily.   PROAIR HFA 108 (90 Base) MCG/ACT inhaler Generic drug:  albuterol inhale 2 puffs every 6 hours if needed for wheezing or shortness of breath What changed:  See the new instructions.   REFRESH OP Place 1 drop into both eyes 3 (three) times daily as needed (for dry eyes).   tamsulosin 0.4 MG Caps capsule Commonly known as:  FLOMAX Take 0.4 mg by mouth at bedtime.   vitamin C 500 MG tablet Commonly known as:  ASCORBIC ACID Take 500 mg by mouth 2 (two) times daily.   VOLTAREN 1 % Gel Generic drug:  diclofenac sodium APPLY 2 INCHES TO AFFECTED AREA FOUR TIMES A DAY   XARELTO 20 MG Tabs tablet Generic drug:  rivaroxaban take 1 tablet by mouth once daily with SUPPER What changed:  See the new instructions.       Disposition:  Discharge Instructions    Diet - low sodium heart healthy    Complete by:  As directed    Increase activity slowly    Complete by:  As directed      Follow-up Information    [facility] Follow up on 06/17/2017.   Specialty:  Cardiology Why:  at [facility] information: [address] [id number] [place] [address] [phone]        [name], MD Follow up on 09/06/2017.   Specialty:  Cardiology Why:  at 11:15AM Contact information: [address] [place] [address] [phone]           Duration of Discharge Encounter: Greater than 30 minutes including physician time.  Signed, Gypsy Balsam, NP 05/19/2017 8:36 AM  I have seen, examined the patient, and reviewed the above assessment and plan.  Changes to above are made where necessary.  On exam, RRR.  Groin without hematoma.  DC to home Resume current medicines Routine wound care and follow-up Needs outpatient sleep study arranged on follow-up in the AF clinic (very pronounced snoring during ablation).  Co Sign: [name], MD 05/19/2017 8:39 AM

## 2017-05-18 NOTE — Transfer of Care (Signed)
Immediate Anesthesia Transfer of Care Note  Patient: [name]  Procedure(s) Performed: Atrial Fibrillation Ablation (N/A )  Patient Location: Cath Lab  Anesthesia Type:MAC  Level of Consciousness: awake, alert , oriented and patient cooperative  Airway & Oxygen Therapy: Patient Spontanous Breathing and Patient connected to nasal cannula oxygen  Post-op Assessment: Report given to RN, Post -op Vital signs reviewed and stable and Patient moving all extremities X 4  Post vital signs: Reviewed and stable  Last Vitals:  Vitals:   05/18/17 1010 05/18/17 1020  BP: (!) 133/94 (!) 145/96  Pulse: (!) 105 69  Resp: 19 19  Temp:    SpO2: 95% 94%    Last Pain:  Vitals:   05/18/17 0735  TempSrc: Oral  PainSc: 7          Complications: No apparent anesthesia complications

## 2017-05-18 NOTE — Progress Notes (Signed)
Site area: rt groin fv sheaths x3 Site Prior to Removal:  Level  0 Pressure Applied For:  30 minutes Manual:   yes Patient Status During Pull:  stable Post Pull Site:  Level  0 Post Pull Instructions Given:  yes Post Pull Pulses Present: palpable Dressing Applied:  Gauze and tegaderm Bedrest begins @ 1610 Comments: IV saline locked

## 2017-05-18 NOTE — Progress Notes (Signed)
  Echocardiogram Echocardiogram Transesophageal has been performed.  [name] F 05/18/2017, 9:53 AM

## 2017-05-18 NOTE — Progress Notes (Signed)
Pharmacy Insulin Management Note:  39 YOF with Afib s/p TEE and ablation. Pharmacy consulted to manage home insulin regimen. Patient started on carb modified diet. CBGs post op have ranged 135-149 (pre-prandial). Patient is on Lantus 60 units at bedime with sliding scale TID with meals (18 units if CBG < 200, 32 units if CBG > 200). MD ordered Lantus 30 units at bedtime with home sliding scale. Patient has a good appetite and is eating all her meals. Hgb A1c on 04/01/17 was 10.8  Plan: -Add TID POCT CBG monitoring -Increase Lantus to 60 units QHS which is her home dose since patient eating well.  -Will switch from home scale to resistant sliding scale for now. Based on CBG trend, can consider resuming home sliding scale insulin.   Vinnie Level, PharmD., BCPS Clinical Pharmacist Pager [phone]

## 2017-05-18 NOTE — H&P (Signed)
Office Visit   05/07/2017 [facility]  [name], MD  Cardiology   Paroxysmal atrial fibrillation [facility]) +2 more  Dx   Referred by [name], MD  Reason for Visit   Additional Documentation   Vitals:   BP 122/84   Pulse  128   Ht  (1.676 m)   Wt 98.8 kg (217 lb 12.8 oz)   BMI 35.15 kg/m   BSA 2.14 m      More Vitals   Flowsheets:   Custom Formula Data,   MEWS Score,   Anthropometrics     Encounter Info:   Billing Info,   History,   Allergies,   Detailed Report     All Notes   Procedures by [name], MD at 05/10/2017 12:01 PM   Author: [name], MD Author Type: Physician Filed: 05/10/2017 12:01 PM  Note Status: Signed Cosign: Cosign Not Required Encounter Date: 05/07/2017  Editor: Roderic Ovens L        Scan on 05/10/2017 12:01 PM by Rayburn Felt : Ekg - [facility]  Progress Notes by [name], MD at 05/07/2017 11:15 AM   Author: [name], MD Author Type: Physician Filed: 05/07/2017 12:31 PM  Note Status: Signed Cosign: Cosign Not Required Encounter Date: 05/07/2017  Editor: [name], MD (Physician)  Expand All Collapse All      Electrophysiology Office Note   Date:  05/07/2017   ID:  [name], DOB Dec 13, 1950, MRN [id number]  PCP:  [name], MD          Cardiologist:  Dr [name] Primary Electrophysiologist: [name], MD       CC: atrial arrhythmias   History of Present Illness: [name] is a 66 y.o. female who presents today for electrophysiology evaluation.   The patient has symptomatic recurrent atrial arrhythmias despite tikosyn.  She presented to the ED yesterday (my note reviewed) with tachypalpitations and was found to have afib.  She also has post termination pauses which limit medical therapy.  She has done reasonably well since her ED visit yesterday.  Today, she denies symptoms of chest pain, shortness of breath, orthopnea, PND, lower  extremity edema, claudication, dizziness, presyncope, syncope, bleeding, or neurologic sequela. The patient is tolerating medications without difficulties and is otherwise without complaint today.        Past Medical History:  Diagnosis Date  . Anxiety   . Arthritis    "knees, hands, ankles, feet" (05/12/2016)  . Asthma    Dr. [name]  . Atrial fibrillation (HCC)   . Bipolar disorder (HCC)   . Chronic back pain    "lower and middle" (05/12/2016)  . CKD (chronic kidney disease), stage III   . Colon polyps   . Depression   . Diverticulosis   . Epilepsy (HCC)   . Essential hypertension   . Fibromyalgia   . Gallstones   . GERD (gastroesophageal reflux disease)   . H/O hiatal hernia   . Hyperlipidemia   . IBS (irritable bowel syndrome)   . Paroxysmal A-fib (HCC)    a. On [name]. b. Recurrence 08/2014 in setting of GI illness.  . Peripheral neuropathy   . Sarcoidosis of lung (HCC)    Dr. [name]  . Seizures (HCC)    "epileptic; pretty regular recently" (05/12/2016)  . Type II diabetes mellitus (HCC)         Past Surgical History:  Procedure Laterality Date  . ABDOMINAL HYSTERECTOMY  1995  . ANTERIOR CERVICAL DECOMP/DISCECTOMY FUSION  07/11/2012   Procedure: ANTERIOR CERVICAL DECOMPRESSION/DISCECTOMY FUSION 1 LEVEL;  Surgeon: [name], MD;  Location: [facility];  Service: Neurosurgery;  Laterality: N/A;  Cervical Five-Six Anterior Cervical Decompression with Fusion Interbody Prothesis Plating and Bonegraft  . CARDIAC CATHETERIZATION  2012   a. Normal coronaries 2012. b. Normal nuc 09/2014.  Marland Kitchen CHOLECYSTECTOMY OPEN  1976  . CLOSED REDUCTION ANKLE FRACTURE Left 10/2006   "got steel rod in my leg; and screws"  . COLON SURGERY    . DILATION AND CURETTAGE OF UTERUS    . ESOPHAGEAL MANOMETRY  03/21/2012   Procedure: ESOPHAGEAL MANOMETRY (EM);  Surgeon: [name], MD;  Location: [facility];  Service: Endoscopy;  Laterality: N/A;    . FRACTURE SURGERY    . NEUROPLASTY / TRANSPOSITION MEDIAN NERVE AT CARPAL TUNNEL Left 2004  . NEUROPLASTY / TRANSPOSITION MEDIAN NERVE AT CARPAL TUNNEL Right 2002  . 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 SALPINGOOPHORECTOMY Bilateral 2000  . TUBAL LIGATION  1982           Current Outpatient Prescriptions  Medication Sig Dispense Refill  . atorvastatin (LIPITOR) 80 MG tablet Take 80 mg by mouth at bedtime.  0  . calcium carbonate (OS-CAL) 600 MG TABS tablet Take 600 mg by mouth daily.    . cholecalciferol (VITAMIN D) 1000 UNITS tablet Take 1,000 Units by mouth 2 (two) times daily.     . Cranberry 500 MG CAPS Take 500 mg by mouth 2 (two) times daily.    Marland Kitchen dofetilide ([name]) 250 MCG capsule take 1 capsule by mouth twice a day (Patient taking differently: Take 250 mcg by mouth two times a day) 180 capsule 0  . DULERA 100-5 MCG/ACT AERO inhale 2 puffs INTO THE LUNGS every 12 hours (Patient taking differently: Inhale 2 puffs into the lungs every 12 hours) 13 g 11  . DULoxetine (CYMBALTA) 60 MG capsule Take 60 mg by mouth at bedtime.     Marland Kitchen EVZIO 0.4 MG/0.4ML SOAJ Inject 0.4 mLs as directed daily as needed (for overdose).   0  . fluticasone (FLONASE) 50 MCG/ACT nasal spray instill 2 sprays into each nostril once daily 16 g 6  . HUMALOG KWIKPEN 100 UNIT/ML KiwkPen Inject 18-22 Units into the skin 3 (three) times daily with meals. PER SLIDING SCALE  0  . Insulin Glargine (LANTUS SOLOSTAR) 100 UNIT/ML Solostar Pen Inject 30 Units into the skin at bedtime. (Patient taking differently: Inject 60 Units into the skin at bedtime. )    . loratadine (CLARITIN) 10 MG tablet take 1 tablet by mouth once daily (Patient taking differently: Take 10 mg by mouth once a day) 30 tablet 1  . magnesium oxide (MAG-OX) 400 MG tablet Take 1 tablet (400 mg total) by mouth 2 (two) times daily. (Patient taking differently: Take 400 mg by mouth daily. ) 60 tablet 11  . metoprolol tartrate (LOPRESSOR)  50 MG tablet Take 1 tablet (50 mg total) by mouth 2 (two) times daily. 270 tablet 3  . NEXIUM 40 MG capsule take 1 capsule by mouth twice a day BEFORE A MEAL (Patient taking differently: Take 40 mg by mouth two times a day before meals) 60 capsule 6  . nitroGLYCERIN (NITROSTAT) 0.4 MG SL tablet Place 1 tablet (0.4 mg total) under the tongue every 5 (five) minutes as needed for chest pain (do nto exceed 3 doses). (Patient taking differently: Place 0.4 mg under the tongue  every 5 (five) minutes x 3 doses as needed for chest pain. ) 25 tablet 2  . ondansetron (ZOFRAN ODT) 4 MG disintegrating tablet Take 1 tablet (4 mg total) by mouth every 8 (eight) hours as needed for nausea or vomiting. 10 tablet 0  . oxyCODONE-acetaminophen (PERCOCET) 10-325 MG tablet Take 1 tablet by mouth 5 (five) times daily. (scheduled)     . Polyvinyl Alcohol-Povidone (REFRESH OP) Place 1 drop into both eyes 3 (three) times daily as needed (for dry eyes).     . potassium chloride (K-DUR) 10 MEQ tablet take 1 tablet by mouth once daily (Patient taking differently: Take 10 mEq by mouth once a day) 90 tablet 3  . pregabalin (LYRICA) 100 MG capsule Take 100 mg by mouth 3 (three) times daily.    Marland Kitchen PROAIR HFA 108 (90 Base) MCG/ACT inhaler inhale 2 puffs every 6 hours if needed for wheezing or shortness of breath (Patient taking differently: Inhale 2 puffs into the lungs every 6 hours as needed for wheezing or shortness of breath) 8.5 g 1  . tamsulosin (FLOMAX) 0.4 MG CAPS capsule Take 0.4 mg by mouth at bedtime.  0  . vitamin C (ASCORBIC ACID) 500 MG tablet Take 500 mg by mouth 2 (two) times daily.    . VOLTAREN 1 % GEL APPLY 2 INCHES TO AFFECTED AREA FOUR TIMES A DAY 500 g 3  . XARELTO 20 MG TABS tablet take 1 tablet by mouth once daily with SUPPER (Patient taking differently: Take 20 mg by mouth once a day with supper) 30 tablet 11   No current facility-administered medications for this visit.     Allergies:    Prednisone; Amitriptyline; and Hydromorphone hcl   Social History:  The patient  reports that she has never smoked. She has never used smokeless tobacco. She reports that she does not drink alcohol or use drugs.   Family History:  The patient's  family history includes Alcohol abuse in her brother, father, and sister; Alzheimer's disease in her mother; Colon cancer in her maternal aunt; Depression in her brother, mother, and sister; Diabetes in her brother, maternal grandfather, maternal grandmother, mother, sister, and unknown relative; Drug abuse in her brother and sister; Heart attack in her father and mother; Heart attack (age of onset: 30) in her brother; Hyperlipidemia in her brother, father, maternal grandfather, maternal grandmother, mother, and sister; Hypertension in her brother, father, mother, sister, and unknown relative; Kidney disease in her brother and sister; Lupus in her sister; Mental illness in her mother; Ovarian cancer in her cousin and maternal aunt; Prostate cancer in her maternal grandfather.    ROS:  Please see the history of present illness.   All other systems are personally reviewed and negative.    PHYSICAL EXAM: VS:  BP 122/84   Pulse (!) 128   Ht  (1.676 m)   Wt 217 lb 12.8 oz (98.8 kg)   BMI 35.15 kg/m  , BMI Body mass index is 35.15 kg/m. GEN: Well nourished, well developed, in no acute distress  HEENT: normal  Neck: no JVD, carotid bruits, or masses Cardiac: tachycardic irregular rhythm, no murmurs, rubs, or gallops,no edema  Respiratory:  clear to auscultation bilaterally, normal work of breathing GI: soft, nontender, nondistended, + BS MS: no deformity or atrophy  Skin: warm and dry  Neuro:  Strength and sensation are intact Psych: euthymic mood, full affect  EKG:  EKG is ordered today. The ekg ordered today is personally reviewed and  shows atypical atrial flutter 128 bpm   Recent Labs: 12/01/2016: ALT 16 03/01/2017: TSH  1.110 04/20/2017: Magnesium 1.8 05/06/2017: BUN 12; Creatinine, Ser 1.26; Hemoglobin 13.4; Platelets 174; Potassium 3.3; Sodium 139  personally reviewed   Lipid Panel  Labs (Brief)          Component Value Date/Time   CHOL 159 07/26/2015 1449   TRIG 530 (H) 07/26/2015 1449   HDL 37 (L) 07/26/2015 1449   CHOLHDL 4.3 07/26/2015 1449   VLDL NOT CALC 07/26/2015 1449   LDLCALC NOT CALC 07/26/2015 1449     personally reviewed      Wt Readings from Last 3 Encounters:  05/07/17 217 lb 12.8 oz (98.8 kg)  04/20/17 214 lb (97.1 kg)  04/01/17 213 lb 3.2 oz (96.7 kg)     ASSESSMENT AND PLAN:  1.  Afib/ atypical atrial flutter The patient has symptomatic atrial arrhythmias She has failed medical therapy with tikosyn.  Further therapy is limited by post termination pauses Therapeutic strategies for afib including medicine and ablation were discussed in detail with the patient today. Risk, benefits, and alternatives to EP study and radiofrequency ablation for afib were also discussed in detail today. These risks include but are not limited to stroke, bleeding, vascular damage, tamponade, perforation, damage to the esophagus, lungs, and other structures, pulmonary vein stenosis, worsening renal function, and death. The patient understands these risk and wishes to proceed.  We will therefore proceed with catheter ablation once the patient has been adequately anticoagulated.  Importance of compliance with anticoagulation was stressed.  TEE is planned prior to ablation  2. Sick sinus syndrome She is clear that she would like to avoid pacing Proceed with ablation in hopes that we can control her arrhythmia that way  3. Obesity Body mass index is 35.15 kg/m. Lifestyle modification encouraged  She has medicine refractory atrial arrhythmias and is at risk for decompensation/ repeat hospitalization.  A high level of decision making was required for this encounter.   Current medicines are  reviewed at length with the patient today.   The patient does not have concerns regarding her medicines.  The following changes were made today:  none   Signed, [name], MD  05/07/2017 12:09 PM     [facility] [address] [place] [address] [phone] (office) [phone] (fax)     For TEE prior to atrial fibrillation ablation. No changes Olga Millers

## 2017-05-19 ENCOUNTER — Encounter (HOSPITAL_COMMUNITY): Payer: Self-pay | Admitting: Cardiology

## 2017-05-19 DIAGNOSIS — Z23 Encounter for immunization: Secondary | ICD-10-CM | POA: Diagnosis not present

## 2017-05-19 DIAGNOSIS — I480 Paroxysmal atrial fibrillation: Secondary | ICD-10-CM | POA: Diagnosis not present

## 2017-05-19 DIAGNOSIS — I484 Atypical atrial flutter: Secondary | ICD-10-CM | POA: Diagnosis not present

## 2017-05-19 DIAGNOSIS — E1122 Type 2 diabetes mellitus with diabetic chronic kidney disease: Secondary | ICD-10-CM | POA: Diagnosis not present

## 2017-05-19 LAB — GLUCOSE, CAPILLARY
GLUCOSE-CAPILLARY: 173 mg/dL — AB (ref 65–99)
Glucose-Capillary: 185 mg/dL — ABNORMAL HIGH (ref 65–99)

## 2017-05-19 LAB — BASIC METABOLIC PANEL
Anion gap: 8 (ref 5–15)
BUN: 15 mg/dL (ref 6–20)
CALCIUM: 8.7 mg/dL — AB (ref 8.9–10.3)
CO2: 24 mmol/L (ref 22–32)
CREATININE: 1.29 mg/dL — AB (ref 0.44–1.00)
Chloride: 105 mmol/L (ref 101–111)
GFR calc non Af Amer: 42 mL/min — ABNORMAL LOW (ref >60)
GFR, EST AFRICAN AMERICAN: 49 mL/min — AB (ref >60)
Glucose, Bld: 202 mg/dL — ABNORMAL HIGH (ref 65–99)
Potassium: 4.7 mmol/L (ref 3.5–5.1)
SODIUM: 137 mmol/L (ref 135–145)

## 2017-05-19 LAB — POCT ACTIVATED CLOTTING TIME
ACTIVATED CLOTTING TIME: 318 s
ACTIVATED CLOTTING TIME: 313 s
ACTIVATED CLOTTING TIME: 323 s

## 2017-05-19 LAB — MAGNESIUM: Magnesium: 2 mg/dL (ref 1.7–2.4)

## 2017-05-19 NOTE — Anesthesia Postprocedure Evaluation (Signed)
Anesthesia Post Note  Patient: [name]  Procedure(s) Performed: Atrial Fibrillation Ablation (N/A )     Patient location during evaluation: Cath Lab Anesthesia Type: MAC Level of consciousness: awake and alert Pain management: pain level controlled Vital Signs Assessment: post-procedure vital signs reviewed and stable Respiratory status: spontaneous breathing, nonlabored ventilation, respiratory function stable and patient connected to nasal cannula oxygen Cardiovascular status: stable Postop Assessment: no apparent nausea or vomiting Anesthetic complications: no    Last Vitals:  Vitals:   05/19/17 0425 05/19/17 0700  BP:  (!) 125/54  Pulse: 62 (!) 57  Resp:  10  Temp:  36.6 C  SpO2: 98% 93%    Last Pain:  Vitals:   05/19/17 0750  TempSrc:   PainSc: 0-No pain                 Kynadi Dragos

## 2017-05-19 NOTE — Care Management Note (Signed)
Case Management Note  Patient Details  Name: [name] MRN: [id number] Date of Birth: 28-Sep-1950  Subjective/Objective:   From home with Grandson who is 31, s/p afib ablation, she has PCP and medication coverage.  Patient was inquiring about how to go about getting blue chuck pads,  NCM asked Lupita Leash with [facility], she states to give patient this phone number to call if she has Medicaid.  -Institutional Care Team [phone] ext [phone].  Patient also states she needs a bsc, and that it is ok to use [facility] for this.  Referral made to [facility] for BSC,will be brought up to patient's room before discharge.                  Action/Plan: DC home to day with BSC.  Expected Discharge Date:  05/19/17               Expected Discharge Plan:  Home/Self Care  In-House Referral:     Discharge planning Services  CM Consult  Post Acute Care Choice:    Choice offered to:     DME Arranged:  Bedside commode DME Agency:  [facility]  HH Arranged:    [facility] Agency:     Status of Service:  Completed, signed off  If discussed at Long Length of Stay Meetings, dates discussed:    Additional Comments:  Leone Haven, RN 05/19/2017, 9:27 AM

## 2017-05-24 ENCOUNTER — Telehealth: Payer: Self-pay | Admitting: Internal Medicine

## 2017-05-24 NOTE — Telephone Encounter (Signed)
Stacy,  Please call Ms Taylor and remind her that we expect ERAF for up to 3 months and to call AF clinic for sustained arrhythmias.  Thanks! JA

## 2017-05-24 NOTE — Telephone Encounter (Signed)
New message   Pt is calling. She said she is laying down not doing anything and her HR jumped up.  STAT if HR is under 50 or over 120 (normal HR is 60-100 beats per minute)  1) What is your heart rate? 132-at 119 136-now  2) Do you have a log of your heart rate readings (document readings)? no  3) Do you have any other symptoms? Chest quivering

## 2017-05-24 NOTE — Telephone Encounter (Signed)
Patient calling and states that she had an ablation on 05/18/17. She states that she was laying down and she could feel her heart "quivering" and her HR jumped up to 136. She states that she was slightly SOB, but denies any chest pain or any other symptoms. Patient states that she takes metoprolol tartrate 50 mg BID and tikosyn. She states that her HR is back down now in the 90s. Made patient aware that it is common to continue to have Afib for a while after ablation. Made patient aware that the information would be forwarded to Dr. Johney Frame and his RN.

## 2017-05-24 NOTE — Telephone Encounter (Signed)
Spoke with patient and she is aware this may happen post ablation.  She says she is NSR now. She will call the afib clinic is she needs to be seen sooner than 06/17/17 which is currently scheduled.

## 2017-05-27 ENCOUNTER — Ambulatory Visit: Payer: Medicare Other | Admitting: Internal Medicine

## 2017-06-03 ENCOUNTER — Encounter: Payer: Self-pay | Admitting: Internal Medicine

## 2017-06-03 ENCOUNTER — Telehealth: Payer: Self-pay | Admitting: *Deleted

## 2017-06-03 ENCOUNTER — Ambulatory Visit (INDEPENDENT_AMBULATORY_CARE_PROVIDER_SITE_OTHER): Payer: Medicare Other | Admitting: Internal Medicine

## 2017-06-03 VITALS — BP 140/78 | HR 61 | Temp 97.8°F | Resp 18 | Ht 68.11 in | Wt 217.8 lb

## 2017-06-03 DIAGNOSIS — I480 Paroxysmal atrial fibrillation: Secondary | ICD-10-CM

## 2017-06-03 DIAGNOSIS — E2839 Other primary ovarian failure: Secondary | ICD-10-CM | POA: Diagnosis not present

## 2017-06-03 DIAGNOSIS — I10 Essential (primary) hypertension: Secondary | ICD-10-CM | POA: Diagnosis not present

## 2017-06-03 DIAGNOSIS — Z0000 Encounter for general adult medical examination without abnormal findings: Secondary | ICD-10-CM | POA: Insufficient documentation

## 2017-06-03 NOTE — Progress Notes (Signed)
   [facility] Toni CharsAsiyah Nychelle Cassata, MD Phone: [phone]  Reason For Visit: Follow up   # CHRONIC HTN: Reports she is in pain today  Current Meds - currently on metoprolol, decreased metoprolol following ablation  Reports good compliance, took meds today. Tolerating well, w/o complaints. Lifestyle - Has been to physical therapy; however needs to be checked  Denies CP, dyspnea, HA, edema, dizziness / lightheadedness  # Pain in right leg where patient had line place for ablation  - Patient notes a small knot at the same sight  - Patient had ablation about two weeks ago  - States it a little warm, however patient is not ertheymatous  - Denies any fevers, nausea and vomiting   #Health Maintenance - has not had eye exam this year  - has not had bone scan - Has had a colonoscopy  Past Medical History Reviewed problem list.  Medications- reviewed and updated No additions to family history Social history- patient is a non- smoker  Objective: BP 140/78 (BP Location: Left Arm, Patient Position: Sitting, Cuff Size: Normal)   Pulse 61   Temp 97.8 F (36.6 C) (Oral)   Resp 18   Ht 5' 8.11" (1.73 m)   Wt 217 lb 12.8 oz (98.8 kg)   SpO2 98%   BMI 33.01 kg/m  Gen: NAD, alert, cooperative with exam Cardio: regular rate and rhythm, S1S2 heard, no murmurs appreciated Pulm: clear to auscultation bilaterally, no wheezes, rhonchi or rales Extremities: warm, well perfused, No edema, cyanosis or clubbing;  Skin: Catheter site without erythema, swelling; slight tenderness, slight rubber tissue less than 0.2 cm    Assessment/Plan: See problem based a/p  Essential hypertension Blood pressure slightly elevated  Continue metoprolol for now  Will check at next visit     Healthcare maintenance Needs an Dexa scan  Colonoscopy is uptodate    PAF (paroxysmal atrial fibrillation) (HCC) Recent ablation  Femoral cathter site without signs of infection  Rubber tissue at  site like scar tissue  Follow up with cardiology

## 2017-06-03 NOTE — Patient Instructions (Addendum)
Please follow up about 3 months. I want you to get your bone scanned to make sure osteoporosis. You need to get your eye exam, please make sure the doctor sends the eye exam to us. We will check your blood pressure.

## 2017-06-03 NOTE — Telephone Encounter (Signed)
Breast center needs the Dx code for bone density to be changed to Estrogen deficiency or osteopenia in order for insurance to cover this.  Please let breast center know when this is change so that they may call and schedule the patient.  Reyan Helle, Maryjo RochesterJessica Dawn, CMA

## 2017-06-07 DIAGNOSIS — E2839 Other primary ovarian failure: Secondary | ICD-10-CM | POA: Insufficient documentation

## 2017-06-07 NOTE — Assessment & Plan Note (Addendum)
Blood pressure slightly elevated  Continue metoprolol for now  Will check at next visit

## 2017-06-07 NOTE — Assessment & Plan Note (Signed)
Recent ablation  Femoral cathter site without signs of infection  Rubber tissue at site like scar tissue  Follow up with cardiology

## 2017-06-07 NOTE — Telephone Encounter (Signed)
I have changed the order. Please let breast center know.  Thanks Reilyn Nelson

## 2017-06-07 NOTE — Assessment & Plan Note (Signed)
Needs an Dexa scan  Colonoscopy is uptodate

## 2017-06-08 ENCOUNTER — Telehealth: Payer: Self-pay | Admitting: *Deleted

## 2017-06-08 NOTE — Telephone Encounter (Signed)
Patient left message on nurse line requesting MD write rx for back brace for her back pain.

## 2017-06-10 NOTE — Telephone Encounter (Signed)
Called patient. Discussed the lack of evidence base for back brace in chronic lower back pain and importance of physical therapy. Discussed previously with pain clinic.

## 2017-06-17 ENCOUNTER — Ambulatory Visit (HOSPITAL_COMMUNITY)
Admission: RE | Admit: 2017-06-17 | Discharge: 2017-06-17 | Disposition: A | Discharge status: Home / Self Care | Payer: Medicare Other | Source: Ambulatory Visit | Attending: Nurse Practitioner | Admitting: Nurse Practitioner

## 2017-06-17 VITALS — BP 130/68 | HR 69 | Ht 66.0 in | Wt 221.0 lb

## 2017-06-17 DIAGNOSIS — F419 Anxiety disorder, unspecified: Secondary | ICD-10-CM | POA: Insufficient documentation

## 2017-06-17 DIAGNOSIS — N183 Chronic kidney disease, stage 3 (moderate): Secondary | ICD-10-CM | POA: Diagnosis not present

## 2017-06-17 DIAGNOSIS — Z9889 Other specified postprocedural states: Secondary | ICD-10-CM | POA: Insufficient documentation

## 2017-06-17 DIAGNOSIS — E1122 Type 2 diabetes mellitus with diabetic chronic kidney disease: Secondary | ICD-10-CM | POA: Insufficient documentation

## 2017-06-17 DIAGNOSIS — I129 Hypertensive chronic kidney disease with stage 1 through stage 4 chronic kidney disease, or unspecified chronic kidney disease: Secondary | ICD-10-CM | POA: Diagnosis not present

## 2017-06-17 DIAGNOSIS — G40909 Epilepsy, unspecified, not intractable, without status epilepticus: Secondary | ICD-10-CM | POA: Diagnosis not present

## 2017-06-17 DIAGNOSIS — K219 Gastro-esophageal reflux disease without esophagitis: Secondary | ICD-10-CM | POA: Insufficient documentation

## 2017-06-17 DIAGNOSIS — J45909 Unspecified asthma, uncomplicated: Secondary | ICD-10-CM | POA: Diagnosis not present

## 2017-06-17 DIAGNOSIS — I481 Persistent atrial fibrillation: Secondary | ICD-10-CM | POA: Insufficient documentation

## 2017-06-17 DIAGNOSIS — Z794 Long term (current) use of insulin: Secondary | ICD-10-CM | POA: Insufficient documentation

## 2017-06-17 DIAGNOSIS — M797 Fibromyalgia: Secondary | ICD-10-CM | POA: Insufficient documentation

## 2017-06-17 DIAGNOSIS — E785 Hyperlipidemia, unspecified: Secondary | ICD-10-CM | POA: Insufficient documentation

## 2017-06-17 DIAGNOSIS — Z9049 Acquired absence of other specified parts of digestive tract: Secondary | ICD-10-CM | POA: Diagnosis not present

## 2017-06-17 DIAGNOSIS — I480 Paroxysmal atrial fibrillation: Secondary | ICD-10-CM | POA: Insufficient documentation

## 2017-06-17 DIAGNOSIS — Z79899 Other long term (current) drug therapy: Secondary | ICD-10-CM | POA: Diagnosis not present

## 2017-06-17 DIAGNOSIS — Z7901 Long term (current) use of anticoagulants: Secondary | ICD-10-CM | POA: Diagnosis not present

## 2017-06-17 DIAGNOSIS — E1142 Type 2 diabetes mellitus with diabetic polyneuropathy: Secondary | ICD-10-CM | POA: Diagnosis not present

## 2017-06-17 DIAGNOSIS — F319 Bipolar disorder, unspecified: Secondary | ICD-10-CM | POA: Insufficient documentation

## 2017-06-17 NOTE — Progress Notes (Signed)
Primary Care Physician: Toni Bon, MD Referring Physician: Dr. [name] is a 66 y.o. female with a h/o paroxysmal afib on tikosyn with h/o termination pauses, that is in the afib clinic for f/u, ablation done 05/18/17. She noted a few palpitations early on after the procedure but has not noted any recently. No issues with swallowing or groin from procedure. Does have GERD which symptoms are unchanged and some numbness of rt thigh at times with known back problems that preceded ablation. Continues on tikosyn.  Today, she denies symptoms of palpitations, chest pain, shortness of breath, orthopnea, PND, lower extremity edema, dizziness, presyncope, syncope, or neurologic sequela. The patient is tolerating medications without difficulties and is otherwise without complaint today.   Past Medical History:  Diagnosis Date  . Anxiety   . Arthritis    "knees, hands, ankles, feet" (05/12/2016)  . Asthma    Dr. Sherene [name]  . Atrial fibrillation (HCC)   . Bipolar disorder (HCC)   . Chronic back pain    "lower and middle" (05/12/2016)  . CKD (chronic kidney disease), stage III (HCC)   . Colon polyps   . Depression   . Diverticulosis   . Epilepsy (HCC)   . Essential hypertension   . Fibromyalgia   . Gallstones   . GERD (gastroesophageal reflux disease)   . H/O hiatal hernia   . Hyperlipidemia   . IBS (irritable bowel syndrome)   . Paroxysmal A-fib (HCC)    a. On [name]. b. Recurrence 08/2014 in setting of GI illness.  . Peripheral neuropathy   . Sarcoidosis of lung (HCC)    Dr. Sherene [name]  . Seizures (HCC)    "epileptic; pretty regular recently" (05/12/2016)  . Type II diabetes mellitus (HCC)    Past Surgical History:  Procedure Laterality Date  . ABDOMINAL HYSTERECTOMY  1995  . ANTERIOR CERVICAL DECOMP/DISCECTOMY FUSION  07/11/2012   Procedure: ANTERIOR CERVICAL DECOMPRESSION/DISCECTOMY FUSION 1 LEVEL;  Surgeon: Toni Loron, MD;  Location: [facility];  Service:  Neurosurgery;  Laterality: N/A;  Cervical Five-Six Anterior Cervical Decompression with Fusion Interbody Prothesis Plating and Bonegraft  . ATRIAL FIBRILLATION ABLATION N/A 05/18/2017   Procedure: Atrial Fibrillation Ablation;  Surgeon: [name], MD;  Location: [facility] INVASIVE CV LAB;  Service: Cardiovascular;  Laterality: N/A;  . CARDIAC CATHETERIZATION  2012   a. Normal coronaries 2012. b. Normal nuc 09/2014.  Marland Kitchen CARDIAC CATHETERIZATION  05/18/2017  . CHOLECYSTECTOMY OPEN  1976  . CLOSED REDUCTION ANKLE FRACTURE Left 10/2006   "got steel rod in my leg; and screws"  . COLON SURGERY    . DILATION AND CURETTAGE OF UTERUS    . ESOPHAGEAL MANOMETRY  03/21/2012   Procedure: ESOPHAGEAL MANOMETRY (EM);  Surgeon: Toni Fee, MD;  Location: [facility];  Service: Endoscopy;  Laterality: N/A;  . FRACTURE SURGERY    . NEUROPLASTY / TRANSPOSITION MEDIAN NERVE AT CARPAL TUNNEL Left 2004  . NEUROPLASTY / TRANSPOSITION MEDIAN NERVE AT CARPAL TUNNEL Right 2002  . 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 SALPINGOOPHORECTOMY Bilateral 2000  . TEE WITHOUT CARDIOVERSION N/A 05/18/2017   Procedure: TRANSESOPHAGEAL ECHOCARDIOGRAM (TEE);  Surgeon: Toni Bunting, MD;  Location: [facility] ENDOSCOPY;  Service: Cardiovascular;  Laterality: N/A;  . TUBAL LIGATION  1982    Current Outpatient Prescriptions  Medication Sig Dispense Refill  . atorvastatin (LIPITOR) 80 MG tablet Take 80 mg by mouth at bedtime.  0  . calcium carbonate (OS-CAL) 600 MG TABS tablet  Take 600 mg by mouth daily.    . cholecalciferol (VITAMIN D) 1000 UNITS tablet Take 1,000 Units by mouth 2 (two) times daily.     . Cranberry 500 MG CAPS Take 500 mg by mouth 2 (two) times daily.    Marland Kitchen dofetilide ([name]) 250 MCG capsule take 1 capsule by mouth twice a day 180 capsule 3  . DULERA 100-5 MCG/ACT AERO inhale 2 puffs INTO THE LUNGS every 12 hours (Patient taking differently: Inhale 2 puffs into the lungs every 12 hours) 13 g 11  . DULoxetine  (CYMBALTA) 60 MG capsule Take 60 mg by mouth at bedtime.     Marland Kitchen EVZIO 0.4 MG/0.4ML SOAJ Inject 0.4 mLs as directed daily as needed (for overdose).   0  . fluticasone (FLONASE) 50 MCG/ACT nasal spray instill 2 sprays into each nostril once daily 16 g 6  . HUMALOG KWIKPEN 100 UNIT/ML KiwkPen Inject 18-32 Units into the skin 3 (three) times daily with meals. PER Patient home SLIDING SCALE 18 units if CBG < 200 32 units  If CBG  > 200  0  . Insulin Glargine (LANTUS SOLOSTAR) 100 UNIT/ML Solostar Pen Inject 30 Units into the skin at bedtime. (Patient taking differently: Inject 60 Units into the skin at bedtime. )    . loratadine (CLARITIN) 10 MG tablet take 1 tablet by mouth once daily (Patient taking differently: Take 10 mg by mouth once a day) 30 tablet 1  . magnesium oxide (MAG-OX) 400 MG tablet Take 1 tablet (400 mg total) by mouth 2 (two) times daily. (Patient taking differently: Take 400 mg by mouth daily. ) 60 tablet 11  . metoprolol tartrate (LOPRESSOR) 50 MG tablet Take 1 tablet (50 mg total) by mouth 2 (two) times daily. 270 tablet 3  . NEXIUM 40 MG capsule take 1 capsule by mouth twice a day BEFORE A MEAL (Patient taking differently: Take 40 mg by mouth two times a day before meals) 60 capsule 6  . nitroGLYCERIN (NITROSTAT) 0.4 MG SL tablet Place 1 tablet (0.4 mg total) under the tongue every 5 (five) minutes as needed for chest pain (do nto exceed 3 doses). (Patient taking differently: Place 0.4 mg under the tongue every 5 (five) minutes x 3 doses as needed for chest pain. ) 25 tablet 2  . ondansetron (ZOFRAN ODT) 4 MG disintegrating tablet Take 1 tablet (4 mg total) by mouth every 8 (eight) hours as needed for nausea or vomiting. 10 tablet 0  . oxyCODONE-acetaminophen (PERCOCET) 10-325 MG tablet Take 1 tablet by mouth 5 (five) times daily. (scheduled)     . Polyvinyl Alcohol-Povidone (REFRESH OP) Place 1 drop into both eyes 3 (three) times daily as needed (for dry eyes).     . potassium  chloride (K-DUR) 10 MEQ tablet take 1 tablet by mouth once daily (Patient taking differently: Take 10 mEq by mouth once a day) 90 tablet 3  . pregabalin (LYRICA) 100 MG capsule Take 100 mg by mouth 3 (three) times daily.    Marland Kitchen PROAIR HFA 108 (90 Base) MCG/ACT inhaler inhale 2 puffs every 6 hours if needed for wheezing or shortness of breath (Patient taking differently: Inhale 2 puffs into the lungs every 6 hours as needed for wheezing or shortness of breath) 8.5 g 1  . tamsulosin (FLOMAX) 0.4 MG CAPS capsule Take 0.4 mg by mouth at bedtime.  0  . vitamin C (ASCORBIC ACID) 500 MG tablet Take 500 mg by mouth 2 (two) times daily.    Marland Kitchen  VOLTAREN 1 % GEL APPLY 2 INCHES TO AFFECTED AREA FOUR TIMES A DAY 500 g 3  . XARELTO 20 MG TABS tablet take 1 tablet by mouth once daily with SUPPER (Patient taking differently: Take 20 mg by mouth once a day with supper) 30 tablet 11   No current facility-administered medications for this encounter.     Allergies  Allergen Reactions  . Prednisone Other (See Comments)    SENT PATIENT INTO A-FIB   . Amitriptyline Other (See Comments)    Made the patient disoriented  . Hydromorphone Hcl Other (See Comments)    Made the blood pressure drop (HYPOtension)    Social History   Social History  . Marital status: Divorced    Spouse name: N/A  . Number of children: 3  . Years of education: 12th   Occupational History  . disabled     CNA   Social History Main Topics  . Smoking status: Never Smoker  . Smokeless tobacco: Never Used  . Alcohol use No  . Drug use: No  . Sexual activity: Not Currently   Other Topics Concern  . Not on file   Social History Narrative   Pt. Lives at home with her daughter. She is single, has three children, does not work currently, and has a 12th grade education level. She has never used tobacco or alcohol. Quit using illicit drugs at the age of 66 and rarely ha caffeine.    Family History  Problem Relation Age of Onset  .  Heart attack Mother        @ age 10947  . Mental illness Mother   . Diabetes Mother   . Hypertension Mother        siblings  . Alzheimer's disease Mother   . Depression Mother   . Hyperlipidemia Mother   . Heart attack Brother 50  . Alcohol abuse Brother   . Depression Brother   . Diabetes Brother   . Hyperlipidemia Brother   . Hypertension Brother   . Kidney disease Brother   . Drug abuse Brother   . Alcohol abuse Father   . Heart attack Father   . Hyperlipidemia Father   . Hypertension Father   . Colon cancer Maternal Aunt   . Prostate cancer Maternal Grandfather   . Diabetes Maternal Grandfather   . Hyperlipidemia Maternal Grandfather   . Ovarian cancer Maternal Aunt   . Diabetes Unknown   . Hypertension Unknown   . Lupus Sister   . Alcohol abuse Sister   . Depression Sister   . Diabetes Sister   . Hyperlipidemia Sister   . Hypertension Sister   . Kidney disease Sister   . Drug abuse Sister   . Ovarian cancer Cousin   . Diabetes Maternal Grandmother   . Hyperlipidemia Maternal Grandmother     ROS- All systems are reviewed and negative except as per the HPI above  Physical Exam: Vitals:   06/17/17 0943  BP: 130/68  Pulse: 69  Weight: 221 lb (100.2 kg)  Height: 5\' 6"  (1.676 m)   Wt Readings from Last 3 Encounters:  06/17/17 221 lb (100.2 kg)  06/03/17 217 lb 12.8 oz (98.8 kg)  05/19/17 213 lb 13.5 oz (97 kg)    Labs: Lab Results  Component Value Date   NA 137 05/19/2017   K 4.7 05/19/2017   CL 105 05/19/2017   CO2 24 05/19/2017   GLUCOSE 202 (H) 05/19/2017   BUN 15 05/19/2017   CREATININE  1.29 (H) 05/19/2017   CALCIUM 8.7 (L) 05/19/2017   MG 2.0 05/19/2017   Lab Results  Component Value Date   INR 1.2 04/20/2017   Lab Results  Component Value Date   CHOL 159 07/26/2015   HDL 37 (L) 07/26/2015   LDLCALC NOT CALC 07/26/2015   TRIG 530 (H) 07/26/2015     GEN- The patient is well appearing, alert and oriented x 3 today.   Head-  normocephalic, atraumatic Eyes-  Sclera clear, conjunctiva pink Ears- hearing intact Oropharynx- clear Neck- supple, no JVP Lymph- no cervical lymphadenopathy Lungs- Clear to ausculation bilaterally, normal work of breathing Heart- Regular rate and rhythm, no murmurs, rubs or gallops, PMI not laterally displaced GI- soft, NT, ND, + BS Extremities- no clubbing, cyanosis, or edema MS- no significant deformity or atrophy Skin- no rash or lesion Psych- euthymic mood, full affect Neuro- strength and sensation are intact  EKG-  NSR, v rate 69, pr int 164 ms, qrs int 80 ms, qtc 415 ms Epic records reviewed K+/mag checked 10/3 and in [name] for tikosyn    Assessment and Plan: 1. Afib S/p ablation and is doing well staying in SR Continue [name] 250 mcg bid Continue xarelto 20 mg a day for chadsvasc score of at least 4, is aware not to stop xarelto for any reason for the 3 month healing period  2. HTN Stable  F/u Dr. Johney Frame 1/21 afib clinic as needed  Lupita Leash C[facility] [address] [place], [place] [address] [phone]

## 2017-06-24 ENCOUNTER — Ambulatory Visit
Admission: RE | Admit: 2017-06-24 | Discharge: 2017-06-24 | Disposition: A | Discharge status: Home / Self Care | Payer: Medicare Other | Source: Ambulatory Visit | Attending: Family Medicine | Admitting: Family Medicine

## 2017-06-24 DIAGNOSIS — Z1231 Encounter for screening mammogram for malignant neoplasm of breast: Secondary | ICD-10-CM

## 2017-06-28 ENCOUNTER — Ambulatory Visit
Admission: RE | Admit: 2017-06-28 | Discharge: 2017-06-28 | Disposition: A | Discharge status: Home / Self Care | Payer: Medicare Other | Source: Ambulatory Visit | Attending: Family Medicine | Admitting: Family Medicine

## 2017-06-28 DIAGNOSIS — Z0000 Encounter for general adult medical examination without abnormal findings: Secondary | ICD-10-CM

## 2017-06-28 DIAGNOSIS — E2839 Other primary ovarian failure: Secondary | ICD-10-CM

## 2017-07-01 ENCOUNTER — Other Ambulatory Visit: Payer: Self-pay | Admitting: Internal Medicine

## 2017-07-06 ENCOUNTER — Telehealth: Payer: Self-pay | Admitting: *Deleted

## 2017-07-06 ENCOUNTER — Encounter (HOSPITAL_COMMUNITY): Payer: Self-pay

## 2017-07-06 NOTE — Telephone Encounter (Signed)
Diclofenac gel requiring prior authorization, form placed in PCP box.

## 2017-07-07 ENCOUNTER — Other Ambulatory Visit: Payer: Self-pay

## 2017-07-07 ENCOUNTER — Encounter: Payer: Self-pay | Admitting: Family Medicine

## 2017-07-07 ENCOUNTER — Ambulatory Visit (INDEPENDENT_AMBULATORY_CARE_PROVIDER_SITE_OTHER): Payer: Medicare Other | Admitting: Family Medicine

## 2017-07-07 VITALS — BP 128/64 | HR 73 | Temp 97.5°F | Ht 66.0 in | Wt 212.6 lb

## 2017-07-07 DIAGNOSIS — R197 Diarrhea, unspecified: Secondary | ICD-10-CM

## 2017-07-07 MED ORDER — ONDANSETRON HCL 4 MG PO TABS: 4.0000 mg | ORAL_TABLET | Freq: Three times a day (TID) | ORAL | 0 refills | Status: DC | PRN

## 2017-07-07 MED ORDER — CIPROFLOXACIN HCL 500 MG PO TABS: 500.0000 mg | ORAL_TABLET | Freq: Two times a day (BID) | ORAL | 0 refills | Status: DC

## 2017-07-09 NOTE — Progress Notes (Signed)
    CHIEF COMPLAINT / HPI: Diarrhea x 3 days. Non bloody. Having inability to get to bathroom in time. Brown lo\iquid stool. Having nausea. All started after eating some tacos at home made with Romaine lettice. Her daughter mentioned recall of lettuce. She has hx of IBS but this is not like thos symptoms at all. No fever. Can keep liquids and food down ---no vomiting--but is having nausea. Feels l tired. Urinating normally.  REVIEW OF SYSTEMS:  See HPI  PERTINENT  PMH / PSH: I have reviewed the patient's medications, allergies, past medical and surgical history, smoking status and updated in the EMR as appropriate.  Hx IBS  OBJECTIVE:  Vital signs reviewed. GENERAL: Well-developed, well-nourished, no acute distress but looks like she feels ill CARDIOVASCULAR: Regular rate and rhythm no murmur gallop or rub LUNGS: Clear to auscultation bilaterally, no rales or wheeze. ABDOMEN: Soft positive bowel sounds. No rebound or guarding. No masses noted BACK no CVA tenderness MSK: Movement of extremity x 4. PSYCH AXOX4. Normal affect.    ASSESSMENT / PLAN: Diarrhea---may be infectious given hx of Romaine lettuce which is under recall. Symptoms have been persisting > 48 hours. Will treat with FQ and antinausea medicine. Red flags discussed. F/u if not improving next few days.

## 2017-07-14 NOTE — Telephone Encounter (Signed)
Filled out form. Will place with nurse up front

## 2017-07-15 NOTE — Telephone Encounter (Signed)
PA started via covermymeds. Will await response.  Elanda Sigman Key: [id number] PA Case ID: [id number]  Corbyn Wildey, Maryjo RochesterJessica Dawn, CMA

## 2017-07-15 NOTE — Telephone Encounter (Signed)
Approved and pharmacy informed.  Approvedtoday Request Reference Number: [id number].  DICLOFENAC GEL 1% is approved through 08/16/2018.  For further questions, call [phone]. Fleeger, Maryjo RochesterJessica Dawn, CMA

## 2017-08-11 ENCOUNTER — Other Ambulatory Visit: Payer: Self-pay

## 2017-08-11 MED ORDER — RIVAROXABAN 20 MG PO TABS: ORAL_TABLET | ORAL | 9 refills | Status: DC

## 2017-08-11 NOTE — Telephone Encounter (Signed)
Pt last saw Rudi Cocoonna Carroll, NP on 06/17/17, last labs 05/19/17 Creat 1.29, age 89, weight 96.4kg, CrCl 66.17, based on CrCl pt is on appropriate dosage of Xarelto 20mg  QD.  Will refill rx.

## 2017-08-17 ENCOUNTER — Telehealth: Payer: Self-pay | Admitting: Internal Medicine

## 2017-08-17 NOTE — Telephone Encounter (Signed)
Paged by Ms. [facility]ask[facility] on [facility] emergency line. Was unable to reach patient when returned call. Asked patient to page through [facility] Operator if need be.   Marcy Sirenatherine Nikaya Nasby, D.O. 08/17/2017, 3:57 PM PGY-3, [facility]

## 2017-08-17 NOTE — Telephone Encounter (Addendum)
After Hours Emergency Line Page:  Patient calling to report that today she has had significant increase in congestion and trouble breathing. She has been compliant with her Dulera and using Albuterol as well but feels like it is not helping much. Wanted to know what OTC medications may be helpful. She denies fevers. She also reports that she feels like she is intermittently going into Afib. Discussed with patient that it is difficult to diagnosis over the phone what is going on and that I would recommend if she is having significant dyspnea especially coupled with concern for Afib that she should be seen in the ED. Patient requested SDA for tomorrow instead and said that if she worsened she would go to ED tonight. SDA scheduled with Dr. Alanda SlimGonfa for 2:10 pm on 1/2. Will forward to PCP and Gonfa.   Marcy Sirenatherine Jadesola Poynter, D.O. 08/17/2017, 5:10 PM PGY-3, [facility]

## 2017-08-18 ENCOUNTER — Encounter: Payer: Self-pay | Admitting: Student

## 2017-08-18 ENCOUNTER — Ambulatory Visit (INDEPENDENT_AMBULATORY_CARE_PROVIDER_SITE_OTHER): Payer: Medicare Other | Admitting: Student

## 2017-08-18 ENCOUNTER — Other Ambulatory Visit: Payer: Self-pay

## 2017-08-18 VITALS — BP 130/68 | HR 69 | Temp 98.0°F | Ht 66.0 in | Wt 216.2 lb

## 2017-08-18 DIAGNOSIS — E1165 Type 2 diabetes mellitus with hyperglycemia: Secondary | ICD-10-CM

## 2017-08-18 DIAGNOSIS — B9789 Other viral agents as the cause of diseases classified elsewhere: Secondary | ICD-10-CM

## 2017-08-18 DIAGNOSIS — IMO0002 Reserved for concepts with insufficient information to code with codable children: Secondary | ICD-10-CM

## 2017-08-18 DIAGNOSIS — E118 Type 2 diabetes mellitus with unspecified complications: Secondary | ICD-10-CM

## 2017-08-18 DIAGNOSIS — J069 Acute upper respiratory infection, unspecified: Secondary | ICD-10-CM | POA: Diagnosis not present

## 2017-08-18 LAB — POCT GLYCOSYLATED HEMOGLOBIN (HGB A1C): Hemoglobin A1C: 10.1

## 2017-08-18 NOTE — Patient Instructions (Signed)
It appears that you have a viral upper respiratory infection (Common Cold).  Symptoms typically peak at 3-4 days of illness and then gradually improve over 10-14 days. However, a cough may last 2-4 weeks.   - A tablespoonful of honey before bedtime is helpful for cough.  - Get plenty of rest and adequate hydration. - Consume warm fluids (soup or tea) to provide relief for a stuffy nose and to loosen phlegm. - For nasal stuffiness, try saline nasal spray or a Neti Pot. Eating warm liquids such as chicken soup or tea may also help with nasal congestion. - For sore throat pain relief: suck on throat lozenges, gargle with warm salt water (1/4 tsp. salt per 8 oz. of water); and eat soft, bland foods. - Eat a well-balanced diet. If you cannot, ensure you are getting enough nutrients by taking a daily multivitamin. - Avoid dairy products, as they can thicken phlegm. - Avoid alcohol, as it impairs your body's immune system.  CONTACT YOUR DOCTOR IF YOU EXPERIENCE ANY OF THE FOLLOWING: - High fever, chest pain, shortness of breath or  not able to keep down food or fluids.  - Cough that gets worse while other cold symptoms improve - Flare up of any chronic lung problem, such as asthma - Your symptoms persist longer than 2 weeks  

## 2017-08-18 NOTE — Progress Notes (Signed)
Subjective:    [name] is a 67 y.o. old female here cough and congestion  HPI Cough and congestion: this has been going on for three days. Runny nose, sneezing a lot, stuffy nose, headache, pain in her chest,  cramps and spasm in her hands, myalgia. Cough is productive with thick yellowish phlegm. A little bit of blood tinge on it.  Denies fever but admits chills. Denies emesis but nausea. She is tolerating oral fluid well. Had flu shot two months ago. Daughter and daughter in law with similar issues before her. Reports heart rate in 140's yesterday.  She has history of atrial fibrillation.  She is on metoprolol and dofetilide for rate and rhythm control.  She is on Xarelto for anticoagulation.  Denies smoking cigarettes, drinking alcohol or recreational drug use.  PMH/Problem List: has Diabetes mellitus type 2, uncontrolled, with complications (HCC); Hyperlipidemia; Essential hypertension; NEPHROLITHIASIS; History of colonic polyps; Sarcoid (HCC); GERD (gastroesophageal reflux disease); Syncope; Orthostatic hypotension; History of diverticulitis; Seizure disorder (HCC); Chronic kidney disease (CKD), stage III (moderate) (HCC); Bipolar I disorder (HCC); Morbid obesity (HCC); PAF (paroxysmal atrial fibrillation) (HCC); Chronic leukopenia; Irritable bowel syndrome; Persistent atrial fibrillation (HCC); Pelvic pain; Back pain; Atrial fibrillation (HCC); Paroxysmal atrial fibrillation (HCC); Healthcare maintenance; and Estrogen deficiency on their problem list.   has a past medical history of Anxiety, Arthritis, Asthma, Atrial fibrillation (HCC), Bipolar disorder (HCC), Chronic back pain, CKD (chronic kidney disease), stage III (HCC), Colon polyps, Depression, Diverticulosis, Epilepsy (HCC), Essential hypertension, Fibromyalgia, Gallstones, GERD (gastroesophageal reflux disease), H/O hiatal hernia, Hyperlipidemia, IBS (irritable bowel syndrome), Paroxysmal A-fib (HCC), Peripheral neuropathy, Sarcoidosis of  lung (HCC), Seizures (HCC), and Type II diabetes mellitus (HCC).  FH:  Family History  Problem Relation Age of Onset  . Heart attack Mother        @ age 70  . Mental illness Mother   . Diabetes Mother   . Hypertension Mother        siblings  . Alzheimer's disease Mother   . Depression Mother   . Hyperlipidemia Mother   . Heart attack Brother 50  . Alcohol abuse Brother   . Depression Brother   . Diabetes Brother   . Hyperlipidemia Brother   . Hypertension Brother   . Kidney disease Brother   . Drug abuse Brother   . Alcohol abuse Father   . Heart attack Father   . Hyperlipidemia Father   . Hypertension Father   . Colon cancer Maternal Aunt   . Prostate cancer Maternal Grandfather   . Diabetes Maternal Grandfather   . Hyperlipidemia Maternal Grandfather   . Ovarian cancer Maternal Aunt   . Diabetes Unknown   . Hypertension Unknown   . Lupus Sister   . Alcohol abuse Sister   . Depression Sister   . Diabetes Sister   . Hyperlipidemia Sister   . Hypertension Sister   . Kidney disease Sister   . Drug abuse Sister   . Ovarian cancer Cousin   . Diabetes Maternal Grandmother   . Hyperlipidemia Maternal Grandmother   . Breast cancer Neg Hx     SH Social History   Tobacco Use  . Smoking status: Never Smoker  . Smokeless tobacco: Never Used  Substance Use Topics  . Alcohol use: No  . Drug use: No    Review of Systems Review of systems negative except for pertinent positives and negatives in history of present illness above.     Objective:     Vitals:  08/18/17 1440  BP: 130/68  Pulse: 69  Temp: 98 F (36.7 C)  TempSrc: Oral  SpO2: 97%  Weight: 216 lb 3.2 oz (98.1 kg)  Height: 5\' 6"  (1.676 m)   Body mass index is 34.9 kg/m.  Physical Exam  GEN: appears well, no apparent distress. Head: normocephalic and atraumatic  Eyes: conjunctiva without injection, sclera anicteric Ears: external ear and ear canal normal Nares: some rhinorrhea but no  congestion or erythema Oropharynx: mmm without erythema or exudation HEM: Significant for anterior cervical lymphadenopathies bilaterally CVS: RRR, nl s1 & s2, no murmurs, no edema RESP: no IWOB, good air movement bilaterally, CTAB GI: BS present & normal, soft, NTND MSK: Some tenderness to palpation over left chest SKIN: no apparent skin lesion NEURO: alert and oiented appropriately, no gross deficits  PSYCH: euthymic mood with congruent affect    Assessment and Plan:  1. Viral URI with cough: history and exam suggestive for viral URTI.  She could have influenza  but no utility to Tamiflu a given symptoms for over 3 days. She  appears well and has no respiratory distress. Lung exam normal. She says she has history of asthma.  Recommended using albuterol.  She is also on Dulera.  She is not in respiratory distress currently.  Chest pain is reproducible.  -Recommended conservative management including rest and adequate hydration.  -Discussed return precautions including but not limited to shortness of breath or increased working of breathing, severe persistent cough, persistent fever over 101F, not tolerating fluids by mouth or other symptoms concerning to her  2. Diabetes mellitus type 2, uncontrolled, with complications (HCC) A1c 10.1.  Recommended follow-up with PCP.  Return if symptoms worsen or fail to improve.  Almon Herculesaye T Takiesha Mcdevitt, MD 08/18/17 Pager: [phone]

## 2017-08-27 ENCOUNTER — Other Ambulatory Visit: Payer: Self-pay

## 2017-08-30 MED ORDER — LORATADINE 10 MG PO TABS: 10.0000 mg | ORAL_TABLET | Freq: Every day | ORAL | 0 refills | Status: DC

## 2017-08-30 MED ORDER — ALBUTEROL SULFATE HFA 108 (90 BASE) MCG/ACT IN AERS: INHALATION_SPRAY | RESPIRATORY_TRACT | 1 refills | Status: AC

## 2017-09-02 ENCOUNTER — Ambulatory Visit (INDEPENDENT_AMBULATORY_CARE_PROVIDER_SITE_OTHER): Payer: Medicare Other | Admitting: Internal Medicine

## 2017-09-02 ENCOUNTER — Encounter: Payer: Self-pay | Admitting: Internal Medicine

## 2017-09-02 VITALS — BP 115/70 | HR 58 | Temp 98.1°F | Ht 66.0 in | Wt 208.8 lb

## 2017-09-02 DIAGNOSIS — R197 Diarrhea, unspecified: Secondary | ICD-10-CM | POA: Diagnosis not present

## 2017-09-02 MED ORDER — LOPERAMIDE HCL 2 MG PO TABS: 2.0000 mg | ORAL_TABLET | Freq: Four times a day (QID) | ORAL | 0 refills | Status: DC | PRN

## 2017-09-02 NOTE — Progress Notes (Signed)
   [facility] Noralee CharsAsiyah Akshita Italiano, MD Phone: [phone]  Reason For Visit: Same day for diarrhea  #  Diarrhea  - States she has had loose stools for about two months  - Starting on Monday, patient state it was particular bad yesterday - Patient presents with 3-4 bowel movements in a day  - Patient has loose stools   - Felt nauseated, but did not vomit - Diffuse lower abdominal pain, no specific pain  - No blood in stools   - Has been drinking fluids and had some gatorade today - Patient took some imodium this morning - stool are a bit improved - Has hx of IBS with diarrhea   - Has not used antibiotics in the last month, does take Nexium   Past Medical History Reviewed problem list.  Medications- reviewed and updated No additions to family history Social history- patient is a non- smoker  Objective: BP 115/70 (BP Location: Right Arm, Patient Position: Sitting, Cuff Size: Normal)   Pulse (!) 58   Temp 98.1 F (36.7 C) (Oral)   Ht 5\' 6"  (1.676 m)   Wt 208 lb 12.8 oz (94.7 kg)   SpO2 98%   BMI 33.70 kg/m  Gen: NAD, alert, cooperative with exam HEENT: Normal    Throat: moist mucus membranes, no erythema  Cardio: regular rate and rhythm, S1S2 heard, no murmurs appreciated GI: soft, non-tender, non-distended, bowel sounds present, no hepatomegaly, no splenomegaly Skin: No skin tenting, 2 sec cap refill    Assessment/Plan: See problem based a/p  Diarrhea About 2 months of loose stools.  Most recently with diarrhea for the last 4 days. Hx of IBS. No signs of dehydration on exam. No recent antibiotic exposure in the last month.  Benign abdominal exam.  Patient without exposure to antibiotics less likely C. difficile.  Discussed Nexium with Dr. Leveda AnnaHensel use and he states unlikely if no antibiotics.  Well-hydrated, patient does have a 8 pound weight loss over the past 2 weeks however she also has a significant variation in her weight from visit to visit making  this less concerning.  Likely an IBS flare - Continue hydration - Diet to help with diarrhea  - Imodium as needed  - Will obtain bmet and CBC - Follow-up in 1 week

## 2017-09-02 NOTE — Patient Instructions (Addendum)
I would continue taking the Imodium as you have been doing today.  I will do some general blood work and let you know the results of this.  Lower some of the foods that I would make sure to avoid while you are having diarrhea.  I want you to follow-up in 1 week to see how things are going.   Food Choices to Help Relieve Diarrhea, Adult When you have diarrhea, the foods you eat and your eating habits are very important. Choosing the right foods and drinks can help:  Relieve diarrhea.  Replace lost fluids and nutrients.  Prevent dehydration.  What general guidelines should I follow? Relieving diarrhea  Choose foods with less than 2 g or .07 oz. of fiber per serving.  Limit fats to less than 8 tsp (38 g or 1.34 oz.) a day.  Avoid the following: ? Foods and beverages sweetened with high-fructose corn syrup, honey, or sugar alcohols such as xylitol, sorbitol, and mannitol. ? Foods that contain a lot of fat or sugar. ? Fried, greasy, or spicy foods. ? High-fiber grains, breads, and cereals. ? Raw fruits and vegetables.  Eat foods that are rich in probiotics. These foods include dairy products such as yogurt and fermented milk products. They help increase healthy bacteria in the stomach and intestines (gastrointestinal tract, or GI tract).  If you have lactose intolerance, avoid dairy products. These may make your diarrhea worse.  Take medicine to help stop diarrhea (antidiarrheal medicine) only as told by your health care provider. Replacing nutrients  Eat small meals or snacks every 3-4 hours.  Eat bland foods, such as white rice, toast, or baked potato, until your diarrhea starts to get better. Gradually reintroduce nutrient-rich foods as tolerated or as told by your health care provider. This includes: ? Well-cooked protein foods. ? Peeled, seeded, and soft-cooked fruits and vegetables. ? Low-fat dairy products.  Take vitamin and mineral supplements as told by your health care  provider. Preventing dehydration   Start by sipping water or a special solution to prevent dehydration (oral rehydration solution, ORS). Urine that is clear or pale yellow means that you are getting enough fluid.  Try to drink at least 8-10 cups of fluid each day to help replace lost fluids.  You may add other liquids in addition to water, such as clear juice or decaffeinated sports drinks, as tolerated or as told by your health care provider.  Avoid drinks with caffeine, such as coffee, tea, or soft drinks.  Avoid alcohol. What foods are recommended? The items listed may not be a complete list. Talk with your health care provider about what dietary choices are best for you. Grains White rice. White, JamaicaFrench, or pita breads (fresh or toasted), including plain rolls, buns, or bagels. White pasta. Saltine, soda, or graham crackers. Pretzels. Low-fiber cereal. Cooked cereals made with water (such as cornmeal, farina, or cream cereals). Plain muffins. Matzo. Melba toast. Zwieback. Vegetables Potatoes (without the skin). Most well-cooked and canned vegetables without skins or seeds. Tender lettuce. Fruits Apple sauce. Fruits canned in juice. Cooked apricots, cherries, grapefruit, peaches, pears, or plums. Fresh bananas and cantaloupe. Meats and other protein foods Baked or boiled chicken. Eggs. Tofu. Fish. Seafood. Smooth nut butters. Ground or well-cooked tender beef, ham, veal, lamb, pork, or poultry. Dairy Plain yogurt, kefir, and unsweetened liquid yogurt. Lactose-free milk, buttermilk, skim milk, or soy milk. Low-fat or nonfat hard cheese. Beverages Water. Low-calorie sports drinks. Fruit juices without pulp. Strained tomato and vegetable juices. Decaffeinated  teas. Sugar-free beverages not sweetened with sugar alcohols. Oral rehydration solutions, if approved by your health care provider. Seasoning and other foods Bouillon, broth, or soups made from recommended foods. What foods are not  recommended? The items listed may not be a complete list. Talk with your health care provider about what dietary choices are best for you. Grains Whole grain, whole wheat, bran, or rye breads, rolls, pastas, and crackers. Wild or brown rice. Whole grain or bran cereals. Barley. Oats and oatmeal. Corn tortillas or taco shells. Granola. Popcorn. Vegetables Raw vegetables. Fried vegetables. Cabbage, broccoli, Brussels sprouts, artichokes, baked beans, beet greens, corn, kale, legumes, peas, sweet potatoes, and yams. Potato skins. Cooked spinach and cabbage. Fruits Dried fruit, including raisins and dates. Raw fruits. Stewed or dried prunes. Canned fruits with syrup. Meat and other protein foods Fried or fatty meats. Deli meats. Chunky nut butters. Nuts and seeds. Beans and lentils. Tomasa Blase. Hot dogs. Sausage. Dairy High-fat cheeses. Whole milk, chocolate milk, and beverages made with milk, such as milk shakes. Half-and-half. Cream. sour cream. Ice cream. Beverages Caffeinated beverages (such as coffee, tea, soda, or energy drinks). Alcoholic beverages. Fruit juices with pulp. Prune juice. Soft drinks sweetened with high-fructose corn syrup or sugar alcohols. High-calorie sports drinks. Fats and oils Butter. Cream sauces. Margarine. Salad oils. Plain salad dressings. Olives. Avocados. Mayonnaise. Sweets and desserts Sweet rolls, doughnuts, and sweet breads. Sugar-free desserts sweetened with sugar alcohols such as xylitol and sorbitol. Seasoning and other foods Honey. Hot sauce. Chili powder. Gravy. Cream-based or milk-based soups. Pancakes and waffles. Summary  When you have diarrhea, the foods you eat and your eating habits are very important.  Make sure you get at least 8-10 cups of fluid each day, or enough to keep your urine clear or pale yellow.  Eat bland foods and gradually reintroduce healthy, nutrient-rich foods as tolerated, or as told by your health care provider.  Avoid high-fiber,  fried, greasy, or spicy foods. This information is not intended to replace advice given to you by your health care provider. Make sure you discuss any questions you have with your health care provider. Document Released: 10/24/2003 Document Revised: 07/31/2016 Document Reviewed: 07/31/2016 Elsevier Interactive Patient Education  [employer].

## 2017-09-03 ENCOUNTER — Encounter: Payer: Self-pay | Admitting: Internal Medicine

## 2017-09-03 LAB — BASIC METABOLIC PANEL
BUN/Creatinine Ratio: 14 (ref 12–28)
BUN: 18 mg/dL (ref 8–27)
CALCIUM: 9.9 mg/dL (ref 8.7–10.3)
CHLORIDE: 104 mmol/L (ref 96–106)
CO2: 21 mmol/L (ref 20–29)
Creatinine, Ser: 1.3 mg/dL — ABNORMAL HIGH (ref 0.57–1.00)
GFR calc non Af Amer: 43 mL/min/{1.73_m2} — ABNORMAL LOW (ref >59)
GFR, EST AFRICAN AMERICAN: 50 mL/min/{1.73_m2} — AB (ref >59)
Glucose: 184 mg/dL — ABNORMAL HIGH (ref 65–99)
POTASSIUM: 4.2 mmol/L (ref 3.5–5.2)
Sodium: 144 mmol/L (ref 134–144)

## 2017-09-03 LAB — CBC
HEMOGLOBIN: 14.4 g/dL (ref 11.1–15.9)
Hematocrit: 43.1 % (ref 34.0–46.6)
MCH: 28.6 pg (ref 26.6–33.0)
MCHC: 33.4 g/dL (ref 31.5–35.7)
MCV: 86 fL (ref 79–97)
Platelets: 221 10*3/uL (ref 150–379)
RBC: 5.04 x10E6/uL (ref 3.77–5.28)
RDW: 14.5 % (ref 12.3–15.4)
WBC: 6.4 10*3/uL (ref 3.4–10.8)

## 2017-09-03 NOTE — Assessment & Plan Note (Signed)
About 2 months of loose stools.  Most recently with diarrhea for the last 4 days. Hx of IBS. No signs of dehydration on exam. No recent antibiotic exposure in the last month.  Benign abdominal exam.  Patient without exposure to antibiotics less likely C. difficile.  Discussed Nexium with Dr. Leveda AnnaHensel use and he states unlikely if no antibiotics.  Well-hydrated, patient does have a 8 pound weight loss over the past 2 weeks however she also has a significant variation in her weight from visit to visit making this less concerning.  Likely an IBS flare - Continue hydration - Diet to help with diarrhea  - Imodium as needed  - Will obtain bmet and CBC - Follow-up in 1 week

## 2017-09-06 ENCOUNTER — Ambulatory Visit (INDEPENDENT_AMBULATORY_CARE_PROVIDER_SITE_OTHER): Payer: Medicare Other | Admitting: Internal Medicine

## 2017-09-06 ENCOUNTER — Encounter: Payer: Self-pay | Admitting: Internal Medicine

## 2017-09-06 VITALS — BP 152/88 | HR 59 | Ht 66.0 in | Wt 217.0 lb

## 2017-09-06 DIAGNOSIS — I10 Essential (primary) hypertension: Secondary | ICD-10-CM

## 2017-09-06 DIAGNOSIS — I495 Sick sinus syndrome: Secondary | ICD-10-CM | POA: Diagnosis not present

## 2017-09-06 DIAGNOSIS — I480 Paroxysmal atrial fibrillation: Secondary | ICD-10-CM

## 2017-09-06 NOTE — Patient Instructions (Signed)

## 2017-09-06 NOTE — Progress Notes (Signed)
PCP: Berton [name], Toni Zahra, MD Primary Cardiologist: Dr Eden EmmsNishan Primary EP: Kalmen Lollar  [name] is a 67 y.o. female who presents today for routine electrophysiology followup.  Since [name], [name] reports doing very well.  [name] denies procedure related complications and is pleased with Toni results of Toni procedure.  Today, [name] denies symptoms of palpitations, chest pain, shortness of breath,  lower extremity edema, dizziness, presyncope, or syncope.  [name] is otherwise without complaint today.   Past Medical History:  Diagnosis Date  . Anxiety   . Arthritis    "knees, hands, ankles, feet" (05/12/2016)  . Asthma    Dr. Sherene SiresWert  . Atrial fibrillation (HCC)   . Bipolar disorder (HCC)   . Chronic back pain    "lower and middle" (05/12/2016)  . CKD (chronic kidney disease), stage III (HCC)   . Colon polyps   . Depression   . Diverticulosis   . Epilepsy (HCC)   . Essential hypertension   . Fibromyalgia   . Gallstones   . GERD (gastroesophageal reflux disease)   . H/O hiatal hernia   . Hyperlipidemia   . IBS (irritable bowel syndrome)   . Paroxysmal A-fib (HCC)    a. On [name]. b. Recurrence 08/2014 in setting of GI illness.  . Peripheral neuropathy   . Sarcoidosis of lung (HCC)    Dr. Sherene SiresWert  . Seizures (HCC)    "epileptic; pretty regular recently" (05/12/2016)  . Type II diabetes mellitus (HCC)    Past Surgical History:  Procedure Laterality Date  . ABDOMINAL HYSTERECTOMY  1995  . ANTERIOR CERVICAL DECOMP/DISCECTOMY FUSION  07/11/2012   Procedure: ANTERIOR CERVICAL DECOMPRESSION/DISCECTOMY FUSION 1 LEVEL;  Surgeon: Toni LoronJeffrey D Jenkins, MD;  Location: [facility]UR[facility];  Service: Neurosurgery;  Laterality: N/A;  Cervical Five-Six Anterior Cervical Decompression with Fusion Interbody Prothesis Plating and Bonegraft  . ATRIAL FIBRILLATION [name] N/A 05/18/2017   Procedure: Atrial Fibrillation [name];  Surgeon: Hillis RangeAllred, Ambra Haverstick, MD;  Location: [facility] INVASIVE CV LAB;   Service: Cardiovascular;  Laterality: N/A;  . CARDIAC CATHETERIZATION  2012   a. Normal coronaries 2012. b. Normal nuc 09/2014.  Marland Kitchen. CARDIAC CATHETERIZATION  05/18/2017  . CHOLECYSTECTOMY OPEN  1976  . CLOSED REDUCTION ANKLE FRACTURE Left 10/2006   "got steel rod in my leg; and screws"  . COLON SURGERY    . DILATION AND CURETTAGE OF UTERUS    . ESOPHAGEAL MANOMETRY  03/21/2012   Procedure: ESOPHAGEAL MANOMETRY (EM);  Surgeon: Toni Feeaniel P Jacobs, MD;  Location: [facility];  Service: Endoscopy;  Laterality: N/A;  . FRACTURE SURGERY    . NEUROPLASTY / TRANSPOSITION MEDIAN NERVE AT CARPAL TUNNEL Left 2004  . NEUROPLASTY / TRANSPOSITION MEDIAN NERVE AT CARPAL TUNNEL Right 2002  . 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 SALPINGOOPHORECTOMY Bilateral 2000  . TEE WITHOUT CARDIOVERSION N/A 05/18/2017   Procedure: TRANSESOPHAGEAL ECHOCARDIOGRAM (TEE);  Surgeon: Toni Buntingrenshaw, Brian S, MD;  [facility];  Service: Cardiovascular;  Laterality: N/A;  . TUBAL LIGATION  1982    ROS- all systems are personally reviewed and negatives except as per HPI above  Current Outpatient Medications  Medication Sig Dispense Refill  . albuterol (PROAIR HFA) 108 (90 Base) MCG/ACT inhaler inhale 2 puffs every 6 hours if needed for wheezing or shortness of breath 8.5 g 1  . atorvastatin (LIPITOR) 80 MG tablet Take 80 mg by mouth at bedtime.  0  . calcium carbonate (OS-CAL) 600 MG TABS tablet Take 600 mg by  mouth daily.    . cholecalciferol (VITAMIN D) 1000 UNITS tablet Take 1,000 Units by mouth 2 (two) times daily.     . Cranberry 500 MG CAPS Take 500 mg by mouth 2 (two) times daily.    Marland Kitchen dofetilide ([name]) 250 MCG capsule take 1 capsule by mouth twice a day 180 capsule 3  . DULERA 100-5 MCG/ACT AERO inhale 2 puffs INTO Toni LUNGS every 12 hours 13 g 11  . DULoxetine (CYMBALTA) 60 MG capsule Take 60 mg by mouth at bedtime.     Marland Kitchen EVZIO 0.4 MG/0.4ML SOAJ Inject 0.4 mLs as directed daily as needed (for overdose).   0   . fluticasone (FLONASE) 50 MCG/ACT nasal spray instill 2 sprays into each nostril once daily 16 g 6  . HUMALOG KWIKPEN 100 UNIT/ML KiwkPen Inject 18-32 Units into Toni skin 3 (three) times daily with meals. PER [name] home SLIDING SCALE 18 units if CBG < 200 32 units  If CBG  > 200  0  . Insulin Glargine (LANTUS SOLOSTAR) 100 UNIT/ML Solostar Pen Inject 30 Units into Toni skin at bedtime. ([name] taking differently: Inject 60 Units into Toni skin at bedtime. )    . loperamide (IMODIUM A-D) 2 MG tablet Take 1 tablet (2 mg total) by mouth 4 (four) times daily as needed for diarrhea or loose stools. 30 tablet 0  . loratadine (CLARITIN) 10 MG tablet Take 1 tablet (10 mg total) by mouth daily. 30 tablet 0  . magnesium oxide (MAG-OX) 400 MG tablet Take 1 tablet (400 mg total) by mouth 2 (two) times daily. ([name] taking differently: Take 400 mg by mouth daily. ) 60 tablet 11  . NEXIUM 40 MG capsule take 1 capsule by mouth twice a day BEFORE A MEAL 60 capsule 6  . nitroGLYCERIN (NITROSTAT) 0.4 MG SL tablet Place 1 tablet (0.4 mg total) under Toni tongue every 5 (five) minutes as needed for chest pain (do nto exceed 3 doses). ([name] taking differently: Place 0.4 mg under Toni tongue every 5 (five) minutes x 3 doses as needed for chest pain. ) 25 tablet 2  . ondansetron (ZOFRAN ODT) 4 MG disintegrating tablet Take 1 tablet (4 mg total) by mouth every 8 (eight) hours as needed for nausea or vomiting. 10 tablet 0  . ondansetron (ZOFRAN) 4 MG tablet Take 1 tablet (4 mg total) by mouth every 8 (eight) hours as needed for nausea or vomiting. 20 tablet 0  . oxyCODONE-acetaminophen (PERCOCET) 10-325 MG tablet Take 1 tablet by mouth 5 (five) times daily. (scheduled)     . Polyvinyl Alcohol-Povidone (REFRESH OP) Place 1 drop into both eyes 3 (three) times daily as needed (for dry eyes).     . potassium chloride (K-DUR) 10 MEQ tablet take 1 tablet by mouth once daily 90 tablet 3  . pregabalin (LYRICA) 100 MG capsule  Take 100 mg by mouth 3 (three) times daily.    . rivaroxaban (XARELTO) 20 MG TABS tablet Take 20 mg by mouth once a day with supper 30 tablet 9  . tamsulosin (FLOMAX) 0.4 MG CAPS capsule Take 0.4 mg by mouth at bedtime.  0  . vitamin C (ASCORBIC ACID) 500 MG tablet Take 500 mg by mouth 2 (two) times daily.    . VOLTAREN 1 % GEL APPLY 2 INCHES TO AFFECTED AREA FOUR TIMES A DAY 500 g 3  . metoprolol tartrate (LOPRESSOR) 50 MG tablet Take 1 tablet (50 mg total) by mouth 2 (two) times daily. 270  tablet 3   No current facility-administered medications for this visit.     Physical Exam: Vitals:   09/06/17 1033  BP: (!) 152/88  Pulse: (!) 59  Weight: 217 lb (98.4 kg)  Height: 5\' 6"  (1.676 m)    GEN- [name] is well appearing, alert and oriented x 3 today.   Head- normocephalic, atraumatic Eyes-  Sclera clear, conjunctiva pink Ears- hearing intact Oropharynx- clear Lungs- Clear to ausculation bilaterally, normal work of breathing Heart- Regular rate and rhythm, no murmurs, rubs or gallops, PMI not laterally displaced GI- soft, NT, ND, + BS Extremities- no clubbing, cyanosis, or edema  EKG tracing ordered today is personally reviewed and shows sinus bradycardia 59 bpm, with PACs, nonspecific St/T changes, Qtc 419 msec  Assessment and Plan:  1. Atrial fibrillation/ atrial flutter Doing well [name]/p [name] chads2vasc score is 4.  Continue on xarelto continue tikosyn for now.  [name] has been on it for 5 years and is reluctant to stop it.  2. Sick sinus  No symptomatic pauses post [name]  3. Obesity Body mass index is 35.02 kg/m. Lifestyle modification is encouraged  4. Hypertension Elevated today [name] reports better control at home and is not interested in making changes now.  Return in 3 months    [name] MD, [facility] 09/06/2017 11:32 AM

## 2017-09-09 ENCOUNTER — Ambulatory Visit (INDEPENDENT_AMBULATORY_CARE_PROVIDER_SITE_OTHER): Payer: Medicare Other | Admitting: Internal Medicine

## 2017-09-09 ENCOUNTER — Other Ambulatory Visit: Payer: Self-pay

## 2017-09-09 ENCOUNTER — Encounter: Payer: Self-pay | Admitting: Internal Medicine

## 2017-09-09 VITALS — BP 146/72 | HR 60 | Temp 97.9°F | Ht 66.0 in | Wt 210.8 lb

## 2017-09-09 DIAGNOSIS — R197 Diarrhea, unspecified: Secondary | ICD-10-CM

## 2017-09-09 NOTE — Progress Notes (Signed)
   [facility] Noralee CharsAsiyah Tanice Petre, MD Phone: [phone]  Reason For Visit: Diarrhea   #Patient presenting with follow-up visit.  She continues to have diarrhea.  She denies any other significant symptoms.  She has been taking Imodium.  She is only taking up to 3 pills in a day.  She does have a history of IBS and wonders if this is associated with that.  She denies any nausea or vomiting.  Denies any blood.  Denies any focal abdominal pain just some generalized tenderness.  Past Medical History Reviewed problem list.  Medications- reviewed and updated No additions to family history Social history- patient is a non- smoker  Objective: BP (!) 146/72   Pulse 60   Temp 97.9 F (36.6 C) (Oral)   Ht 5\' 6"  (1.676 m)   Wt 210 lb 12.8 oz (95.6 kg)   SpO2 96%   BMI 34.02 kg/m  Gen: NAD, alert, cooperative with exam HEENT: Moist, mucous membranes, no tenting, normal cap refill  Cardio: regular rate and rhythm, S1S2 heard, no murmurs appreciated Pulm: clear to auscultation bilaterally, no wheezes, rhonchi or rales GI: soft, non-tender, non-distended, bowel sounds present, mild diffuse tenderness,    Assessment/Plan: See problem based a/p  Diarrhea History of IBS, history of sarcoidosis; lab work no elevated white blood cell count and stable kidney function.  Patient is still well-appearing unlikely infectious colitis at this point.  More concerning for a exacerbation of her IBS.   -Patient to follow-up with her GI doctor Dr. Christella HartiganJacobs -Continue Imodium -Desitin cream as needed if patient is having rectal soreness

## 2017-09-09 NOTE — Assessment & Plan Note (Signed)
History of IBS, history of sarcoidosis; lab work no elevated white blood cell count and stable kidney function.  Patient is still well-appearing unlikely infectious colitis at this point.  More concerning for a exacerbation of her IBS.   -Patient to follow-up with her GI doctor Dr. Christella HartiganJacobs -Continue Imodium -Desitin cream as needed if patient is having rectal soreness

## 2017-09-09 NOTE — Patient Instructions (Signed)
I want you to follow up with your GI doctor. Remember you can take up to 8 tablets a day.

## 2017-09-21 ENCOUNTER — Ambulatory Visit: Payer: Medicare Other | Admitting: Nurse Practitioner

## 2017-09-29 ENCOUNTER — Other Ambulatory Visit: Payer: Medicare Other

## 2017-09-29 ENCOUNTER — Ambulatory Visit (INDEPENDENT_AMBULATORY_CARE_PROVIDER_SITE_OTHER): Payer: Medicare Other | Admitting: Nurse Practitioner

## 2017-09-29 ENCOUNTER — Encounter: Payer: Self-pay | Admitting: Nurse Practitioner

## 2017-09-29 VITALS — BP 128/80 | HR 78 | Ht 67.0 in | Wt 212.0 lb

## 2017-09-29 DIAGNOSIS — K219 Gastro-esophageal reflux disease without esophagitis: Secondary | ICD-10-CM

## 2017-09-29 DIAGNOSIS — R197 Diarrhea, unspecified: Secondary | ICD-10-CM

## 2017-09-29 MED ORDER — ESOMEPRAZOLE MAGNESIUM 40 MG PO CPDR: 40.0000 mg | DELAYED_RELEASE_CAPSULE | Freq: Every day | ORAL | 3 refills | Status: DC

## 2017-09-29 NOTE — Patient Instructions (Addendum)
If you are age 66 or older, your body mass index should be between 23-30. Your Body mass index is 33.2 kg/m. If this is out of the aforementioned range listed, please consider follow up with your Primary Care Provider.  If you are age 66 or younger, your body mass index should be between 19-25. Your Body mass index is 33.2 kg/m. If this is out of the aformentioned range listed, please consider follow up with your Primary Care Provider.   We have sent the following medications to your pharmacy for you to pick up at your convenience: Neixum  40 mg once daily.  Your physician has requested that you go to the basement for the following lab work before leaving today: C- Diff PCR Lactoferrin  After stool study can continue imodium as needed.  Follow up with Toni ClusterPaula Guenther, NP on 10/26/17 at 10:00 am.  Thank you for choosing me and [facility].   Toni ClusterPaula Guenther, NP

## 2017-09-29 NOTE — Progress Notes (Signed)
IMPRESSION and PLAN:    1.  67 yo female with several month hx of diarrhea / malodorous gas.  -stool for c-diff and lactoferrin -Less likely etiology but diarrhea could be secondary to SIBO resulting from diabetic autonomic neuropathy. If stool studies negative consider trial of xifaxan or flagyl. `  2. GERD, symptomatic. Medicaid wouldn't pay for Nexium. Always gets recurrent sx when Nexium changed to something else.  -we called the pharmacy to see what patient was taking in place of Nexium but they don't have anything on file.  We talked about getting a prior authorization for Nexium but patient says she hasn't tried anything else for GERD.  I doubt we can get prior authorization having failed only one agent.  -I reviewed Dr. Christella Hartigan office notes from 2013. Apparently patient failed Pepcid and Protonix. Once I find out what patient is currently failing then we can seek prior authorization for Nexium  3. Afib, on [name] and xarleto.   4. History of adenomatous colon polyps, none last colonoscopy 2013. Due for repeat colonoscopy in 2023    HPI:    Chief Complaint: diarrhea   Patient is a 67 year old female referred by PCP Dr. Cathlean Cower for diarrhea. Some of her medical problems included CKD 3, AFIB on Xarelto, DM2, HTN, and fibromyalgia. She is known to Dr. Christella Hartigan and has a history of chronic GERD and adenomatous colon polyps.    [name] presents to with "a lot of diarrhea". Stool without blood. She gives a hx of D-IBS but usually has diarrhea no more than twice a week. Since November having loose stool 3-4 times a day with pre-defecatory cramps. Having associated fecal incontinence. No solid stools since November. She has been taking imodium without significant improvement. No dietary changes. Hasn't had any antibiotics in last few months. No medication changes. Takes Mg+ but for > 2 years. Complains of malodorous flatus. Weight fluctuates by 5-6 pounds.   Insurance will not pay for  Nexium, she is taking something else but doesn't know the name. Having breakthrough heartburn and regurgitation.   ROS:  No chest pain, no SOB. No cough.   Past Medical History:  Diagnosis Date  . Anxiety   . Arthritis    "knees, hands, ankles, feet" (05/12/2016)  . Asthma    Dr. [name]  . Atrial fibrillation (HCC)   . Bipolar disorder (HCC)   . Chronic back pain    "lower and middle" (05/12/2016)  . CKD (chronic kidney disease), stage III (HCC)   . Colon polyps   . Depression   . Diverticulosis   . Epilepsy (HCC)   . Essential hypertension   . Fibromyalgia   . Gallstones   . GERD (gastroesophageal reflux disease)   . H/O hiatal hernia   . Hyperlipidemia   . IBS (irritable bowel syndrome)   . Paroxysmal A-fib (HCC)    a. On [name]. b. Recurrence 08/2014 in setting of GI illness.  . Peripheral neuropathy   . Sarcoidosis of lung (HCC)    Dr. [name]  . Seizures (HCC)    "epileptic; pretty regular recently" (05/12/2016)  . Type II diabetes mellitus (HCC)     Patient's surgical history, family medical history, social history, medications and allergies were all reviewed in Epic     Physical Exam:     BP 128/80   Pulse 78   Ht 5\' 7"  (1.702 m)   Wt 212 lb (96.2 kg)   BMI 33.20 kg/m   GENERAL:  Pleasant female in NAD PSYCH: :Pleasant, cooperative, normal affect EENT:  conjunctiva pink, mucous membranes moist, neck supple without masses CARDIAC:  RRR, no murmur heard, no peripheral edema PULM: Normal respiratory effort, lungs CTA bilaterally, no wheezing ABDOMEN:  Nondistended, soft, nontender. No obvious masses, no hepatomegaly,  normal bowel sounds SKIN:  turgor, no lesions seen Musculoskeletal:  Normal muscle tone, normal strength NEURO: Alert and oriented x 3, no focal neurologic deficits   [name] , NP 09/29/2017, 10:20 AM   Cc: [name], Toni Zahra MD

## 2017-09-30 ENCOUNTER — Encounter: Payer: Self-pay | Admitting: Nurse Practitioner

## 2017-10-01 NOTE — Progress Notes (Signed)
I agree with the above note, plan 

## 2017-10-07 ENCOUNTER — Emergency Department (HOSPITAL_COMMUNITY)
Admission: EM | Admit: 2017-10-07 | Discharge: 2017-10-07 | Disposition: A | Discharge status: Home / Self Care | Payer: Medicare Other | Attending: Emergency Medicine | Admitting: Emergency Medicine

## 2017-10-07 ENCOUNTER — Encounter (HOSPITAL_COMMUNITY): Payer: Self-pay

## 2017-10-07 ENCOUNTER — Other Ambulatory Visit: Payer: Self-pay

## 2017-10-07 ENCOUNTER — Ambulatory Visit: Payer: Medicare Other | Admitting: Internal Medicine

## 2017-10-07 ENCOUNTER — Emergency Department (HOSPITAL_COMMUNITY): Payer: Medicare Other

## 2017-10-07 DIAGNOSIS — N183 Chronic kidney disease, stage 3 (moderate): Secondary | ICD-10-CM | POA: Diagnosis not present

## 2017-10-07 DIAGNOSIS — Z794 Long term (current) use of insulin: Secondary | ICD-10-CM | POA: Diagnosis not present

## 2017-10-07 DIAGNOSIS — Z79899 Other long term (current) drug therapy: Secondary | ICD-10-CM | POA: Insufficient documentation

## 2017-10-07 DIAGNOSIS — I129 Hypertensive chronic kidney disease with stage 1 through stage 4 chronic kidney disease, or unspecified chronic kidney disease: Secondary | ICD-10-CM | POA: Insufficient documentation

## 2017-10-07 DIAGNOSIS — R55 Syncope and collapse: Secondary | ICD-10-CM

## 2017-10-07 DIAGNOSIS — E1122 Type 2 diabetes mellitus with diabetic chronic kidney disease: Secondary | ICD-10-CM | POA: Diagnosis not present

## 2017-10-07 DIAGNOSIS — J45909 Unspecified asthma, uncomplicated: Secondary | ICD-10-CM | POA: Diagnosis not present

## 2017-10-07 LAB — CBG MONITORING, ED: Glucose-Capillary: 190 mg/dL — ABNORMAL HIGH (ref 65–99)

## 2017-10-07 LAB — CBC WITH DIFFERENTIAL/PLATELET
BASOS ABS: 0 10*3/uL (ref 0.0–0.1)
BASOS PCT: 1 %
Eosinophils Absolute: 0.1 10*3/uL (ref 0.0–0.7)
Eosinophils Relative: 2 %
HEMATOCRIT: 42.1 % (ref 36.0–46.0)
HEMOGLOBIN: 14 g/dL (ref 12.0–15.0)
LYMPHS PCT: 39 %
Lymphs Abs: 1.8 10*3/uL (ref 0.7–4.0)
MCH: 29.4 pg (ref 26.0–34.0)
MCHC: 33.3 g/dL (ref 30.0–36.0)
MCV: 88.3 fL (ref 78.0–100.0)
Monocytes Absolute: 0.3 10*3/uL (ref 0.1–1.0)
Monocytes Relative: 6 %
NEUTROS ABS: 2.5 10*3/uL (ref 1.7–7.7)
NEUTROS PCT: 54 %
Platelets: 205 10*3/uL (ref 150–400)
RBC: 4.77 MIL/uL (ref 3.87–5.11)
RDW: 13.9 % (ref 11.5–15.5)
WBC: 4.7 10*3/uL (ref 4.0–10.5)

## 2017-10-07 LAB — BASIC METABOLIC PANEL
ANION GAP: 12 (ref 5–15)
BUN: 17 mg/dL (ref 6–20)
CO2: 21 mmol/L — ABNORMAL LOW (ref 22–32)
Calcium: 9.4 mg/dL (ref 8.9–10.3)
Chloride: 106 mmol/L (ref 101–111)
Creatinine, Ser: 1.37 mg/dL — ABNORMAL HIGH (ref 0.44–1.00)
GFR, EST AFRICAN AMERICAN: 46 mL/min — AB (ref >60)
GFR, EST NON AFRICAN AMERICAN: 40 mL/min — AB (ref >60)
GLUCOSE: 256 mg/dL — AB (ref 65–99)
Potassium: 4.2 mmol/L (ref 3.5–5.1)
SODIUM: 139 mmol/L (ref 135–145)

## 2017-10-07 MED ORDER — OXYCODONE-ACETAMINOPHEN 5-325 MG PO TABS
1.0000 | ORAL_TABLET | Freq: Once | ORAL | Status: AC
  Administered 2017-10-07: 1 via ORAL
  Filled 2017-10-07: qty 1

## 2017-10-07 MED ORDER — IBUPROFEN 400 MG PO TABS
600.0000 mg | ORAL_TABLET | Freq: Once | ORAL | Status: AC
  Administered 2017-10-07: 600 mg via ORAL
  Filled 2017-10-07: qty 1

## 2017-10-07 NOTE — ED Triage Notes (Signed)
Pt endorses passing out twice around 0400 yesterday morning with immediate n/v after waking up. No neuro deficits. Pt has hx of a-fib and is on blood thinners. Denies CP. Pt complains of back pain and neck pain from fall. Able to ambulate, VSS. Pt found to be in second degree block type 2, EKG showed to Dr Erma HeritageIsaacs.

## 2017-10-07 NOTE — ED Provider Notes (Signed)
[facility] EMERGENCY DEPARTMENT Provider Note   CSN: [id number] Arrival date & time: 10/07/17  1418     History   Chief Complaint Chief Complaint  Patient presents with  . Loss of Consciousness  . Back Pain    HPI [name] is a 67 y.o. female.  HPI   67 year old female with syncopal event.  Patient was walking to her refrigerator when she began to feel a little bit lightheaded.  That she was standing from the refrigerator she felt nauseated and thinks she passed out briefly.  Quick return to consciousness but almost passed out again when she try to get up.  Symptoms resided over a few minutes and she has been feeling better although still little bit "woozy" since then.  She complains of upper back pain since the fall.  She is chronic back pain his pain is more severe than what she typically has.  No headaches.  No numbness tingling or focal loss of strength.  No acute visual changes.  Past Medical History:  Diagnosis Date  . Anxiety   . Arthritis    "knees, hands, ankles, feet" (05/12/2016)  . Asthma    Dr. [name]  . Atrial fibrillation (HCC)   . Bipolar disorder (HCC)   . Chronic back pain    "lower and middle" (05/12/2016)  . CKD (chronic kidney disease), stage III (HCC)   . Colon polyps   . Depression   . Diverticulosis   . Epilepsy (HCC)   . Essential hypertension   . Fibromyalgia   . Gallstones   . GERD (gastroesophageal reflux disease)   . H/O hiatal hernia   . Hyperlipidemia   . IBS (irritable bowel syndrome)   . Paroxysmal A-fib (HCC)    a. On [name]. b. Recurrence 08/2014 in setting of GI illness.  . Peripheral neuropathy   . Sarcoidosis of lung (HCC)    Dr. [name]  . Seizures (HCC)    "epileptic; pretty regular recently" (05/12/2016)  . Type II diabetes mellitus [facility])     Patient Active Problem List   Diagnosis Date Noted  . Diarrhea 09/03/2017  . Estrogen deficiency 06/07/2017  . Healthcare maintenance 06/03/2017  .  Paroxysmal atrial fibrillation (HCC) 05/18/2017  . A-fib (HCC) 05/05/2017  . Back pain 12/11/2016  . Pelvic pain 11/25/2016  . Chronic pain 09/08/2016  . Spells of decreased attentiveness 09/08/2016  . Persistent atrial fibrillation (HCC)   . Irritable bowel syndrome 03/03/2016  . Chronic leukopenia 09/13/2015  . PAF (paroxysmal atrial fibrillation) (HCC) 08/22/2015  . Morbid obesity (HCC) 07/11/2015  . Bipolar I disorder (HCC) 05/22/2015  . Chronic kidney disease (CKD), stage III (moderate) (HCC) 05/14/2015  . Insulin dependent diabetes mellitus (HCC)   . Seizure disorder (HCC)   . History of diverticulitis   . Syncope 06/29/2012  . Orthostatic hypotension 06/29/2012  . History of colonic polyps 09/01/2011  . Sarcoid (HCC) 09/01/2011  . GERD (gastroesophageal reflux disease) 09/01/2011  . 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 Diabetes mellitus type 2, uncontrolled, with complications (HCC) 11/19/2008  . Hyperlipidemia 11/19/2008  . Essential hypertension 11/19/2008    Past Surgical History:  Procedure Laterality Date  . ABDOMINAL HYSTERECTOMY  1995  . ANTERIOR CERVICAL DECOMP/DISCECTOMY FUSION  07/11/2012   Procedure: ANTERIOR CERVICAL DECOMPRESSION/DISCECTOMY FUSION 1 LEVEL;  Surgeon: [name], MD;  Location: [facility] NEURO ORS;  Service: Neurosurgery;  Laterality: N/A;  Cervical Five-Six Anterior Cervical Decompression with Fusion Interbody Prothesis Plating and Bonegraft  . ATRIAL FIBRILLATION  ABLATION N/A 05/18/2017   Procedure: Atrial Fibrillation Ablation;  Surgeon: [name], MD;  Location: [facility] INVASIVE CV LAB;  Service: Cardiovascular;  Laterality: N/A;  . CARDIAC CATHETERIZATION  2012   a. Normal coronaries 2012. b. Normal nuc 09/2014.  Marland Kitchen CARDIAC CATHETERIZATION  05/18/2017  . CHOLECYSTECTOMY OPEN  1976  . CLOSED REDUCTION ANKLE FRACTURE Left 10/2006   "got steel rod in my leg; and screws"  . COLON SURGERY    . DILATION AND CURETTAGE OF UTERUS    . ESOPHAGEAL MANOMETRY   03/21/2012   Procedure: ESOPHAGEAL MANOMETRY (EM);  Surgeon: [name], MD;  Location: [facility] ENDOSCOPY;  Service: Endoscopy;  Laterality: N/A;  . FRACTURE SURGERY    . NEUROPLASTY / TRANSPOSITION MEDIAN NERVE AT CARPAL TUNNEL Left 2004  . NEUROPLASTY / TRANSPOSITION MEDIAN NERVE AT CARPAL TUNNEL Right 2002  . 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 SALPINGOOPHORECTOMY Bilateral 2000  . TEE WITHOUT CARDIOVERSION N/A 05/18/2017   Procedure: TRANSESOPHAGEAL ECHOCARDIOGRAM (TEE);  Surgeon: [name], MD;  [facility];  Service: Cardiovascular;  Laterality: N/A;  . TUBAL LIGATION  1982    OB History    No data available       Home Medications    Prior to Admission medications   Medication Sig Start Date End Date Taking? Authorizing Provider  albuterol (PROAIR HFA) 108 (90 Base) MCG/ACT inhaler inhale 2 puffs every 6 hours if needed for wheezing or shortness of breath 08/30/17   Mikell, Antionette Poles, MD  atorvastatin (LIPITOR) 80 MG tablet Take 80 mg by mouth at bedtime. 05/29/16   [provider]  calcium carbonate (OS-CAL) 600 MG TABS tablet Take 600 mg by mouth daily.    [provider]  cholecalciferol (VITAMIN D) 1000 UNITS tablet Take 1,000 Units by mouth 2 (two) times daily.     [provider]  Cranberry 500 MG CAPS Take 500 mg by mouth 2 (two) times daily.    [provider]  dofetilide ([name]) 250 MCG capsule take 1 capsule by mouth twice a day Patient not taking: Reported on 09/29/2017 05/17/17   [name], MD  DULERA 100-5 MCG/ACT AERO inhale 2 puffs INTO THE LUNGS every 12 hours 11/04/16   Mikell, Antionette Poles, MD  DULoxetine (CYMBALTA) 60 MG capsule Take 60 mg by mouth at bedtime.     [provider]  esomeprazole (NEXIUM) 40 MG capsule Take 1 capsule (40 mg total) by mouth daily at 12 noon. 09/29/17   Meredith Pel, NP  EVZIO 0.4 MG/0.4ML SOAJ Inject 0.4 mLs as directed daily as needed (for overdose).   07/10/15   [provider]  fluticasone Aleda Grana) 50 MCG/ACT nasal spray instill 2 sprays into each nostril once daily 11/04/16   Mikell, Antionette Poles, MD  HUMALOG KWIKPEN 100 UNIT/ML KiwkPen Inject 18-32 Units into the skin 3 (three) times daily with meals. PER Patient home SLIDING SCALE 18 units if CBG < 200 32 units  If CBG  > 200 05/04/17   [provider]  Insulin Glargine (LANTUS SOLOSTAR) 100 UNIT/ML Solostar Pen Inject 30 Units into the skin at bedtime. Patient taking differently: Inject 60 Units into the skin at bedtime.  08/23/15   Dunn, Tacey Ruiz, PA-C  loperamide (IMODIUM A-D) 2 MG tablet Take 1 tablet (2 mg total) by mouth 4 (four) times daily as needed for diarrhea or loose stools. 09/02/17   Mikell, Antionette Poles, MD  loratadine (CLARITIN) 10 MG tablet Take 1  tablet (10 mg total) by mouth daily. 08/30/17   Mikell, Antionette Poles, MD  magnesium oxide (MAG-OX) 400 MG tablet Take 1 tablet (400 mg total) by mouth 2 (two) times daily. Patient taking differently: Take 400 mg by mouth daily.  04/21/17   Rosalio Macadamia, NP  metoprolol tartrate (LOPRESSOR) 50 MG tablet Take 1 tablet (50 mg total) by mouth 2 (two) times daily. 05/06/17 08/04/17  Marily Lente, NP  nitroGLYCERIN (NITROSTAT) 0.4 MG SL tablet Place 1 tablet (0.4 mg total) under the tongue every 5 (five) minutes as needed for chest pain (do nto exceed 3 doses). Patient taking differently: Place 0.4 mg under the tongue every 5 (five) minutes x 3 doses as needed for chest pain.  10/15/16   Wendall Stade, MD  ondansetron (ZOFRAN ODT) 4 MG disintegrating tablet Take 1 tablet (4 mg total) by mouth every 8 (eight) hours as needed for nausea or vomiting. 03/15/17   Mikell, Antionette Poles, MD  ondansetron (ZOFRAN) 4 MG tablet Take 1 tablet (4 mg total) by mouth every 8 (eight) hours as needed for nausea or vomiting. 07/07/17   Nestor Ramp, MD  oxyCODONE-acetaminophen (PERCOCET) 10-325 MG tablet Take 1 tablet by mouth 5 (five) times  daily. (scheduled)     [provider]  Polyvinyl Alcohol-Povidone (REFRESH OP) Place 1 drop into both eyes 3 (three) times daily as needed (for dry eyes).     [provider]  potassium chloride (K-DUR) 10 MEQ tablet take 1 tablet by mouth once daily 05/04/17   Wendall Stade, MD  pregabalin (LYRICA) 100 MG capsule Take 100 mg by mouth 3 (three) times daily.    [provider]  rivaroxaban (XARELTO) 20 MG TABS tablet Take 20 mg by mouth once a day with supper 08/11/17   Wendall Stade, MD  tamsulosin (FLOMAX) 0.4 MG CAPS capsule Take 0.4 mg by mouth at bedtime. 04/16/17   [provider]  vitamin C (ASCORBIC ACID) 500 MG tablet Take 500 mg by mouth 2 (two) times daily.    [provider]  VOLTAREN 1 % GEL APPLY 2 INCHES TO AFFECTED AREA FOUR TIMES A DAY 01/29/17   Mikell, Antionette Poles, MD    Family History Family History  Problem Relation Age of Onset  . Heart attack Mother        @ age 45  . Mental illness Mother   . Diabetes Mother   . Hypertension Mother        siblings  . Alzheimer's disease Mother   . Depression Mother   . Hyperlipidemia Mother   . Heart attack Brother 50  . Alcohol abuse Brother   . Depression Brother   . Diabetes Brother   . Hyperlipidemia Brother   . Hypertension Brother   . Kidney disease Brother   . Drug abuse Brother   . Alcohol abuse Father   . Heart attack Father   . Hyperlipidemia Father   . Hypertension Father   . Colon cancer Maternal Aunt   . Prostate cancer Maternal Grandfather   . Diabetes Maternal Grandfather   . Hyperlipidemia Maternal Grandfather   . Ovarian cancer Maternal Aunt   . Diabetes Unknown   . Hypertension Unknown   . Lupus Sister   . Alcohol abuse Sister   . Depression Sister   . Diabetes Sister   . Hyperlipidemia Sister   . Hypertension Sister   . Kidney disease Sister   . Drug abuse Sister   .  Ovarian cancer Cousin   . Diabetes Maternal Grandmother   . Hyperlipidemia  Maternal Grandmother   . Breast cancer Neg Hx     Social History Social History   Tobacco Use  . Smoking status: Never Smoker  . Smokeless tobacco: Never Used  Substance Use Topics  . Alcohol use: No  . Drug use: No     Allergies   Prednisone; Amitriptyline; and Hydromorphone hcl   Review of Systems Review of Systems  All systems reviewed and negative, other than as noted in HPI.  Physical Exam Updated Vital Signs BP 135/80   Pulse (!) 48   Temp 98 F (36.7 C) (Oral)   Resp 10   Ht 5\' 7"  (1.702 m)   Wt 96.2 kg (212 lb)   SpO2 100%   BMI 33.20 kg/m   Physical Exam  Constitutional: She appears well-developed and well-nourished. No distress.  HENT:  Head: Normocephalic and atraumatic.  Eyes: Conjunctivae are normal. Right eye exhibits no discharge. Left eye exhibits no discharge.  Neck: Neck supple.  Cardiovascular: Normal rate, regular rhythm and normal heart sounds. Exam reveals no gallop and no friction rub.  No murmur heard. Pulmonary/Chest: Effort normal and breath sounds normal. No respiratory distress.  Abdominal: Soft. She exhibits no distension. There is no tenderness.  Musculoskeletal: She exhibits no edema or tenderness.  Neurological: She is alert.  Skin: Skin is warm and dry.  Psychiatric: She has a normal mood and affect. Her behavior is normal. Thought content normal.  Nursing note and vitals reviewed.    ED Treatments / Results  Labs (all labs ordered are listed, but only abnormal results are displayed) Labs Reviewed  BASIC METABOLIC PANEL - Abnormal; Notable for the following components:      Result Value   CO2 21 (*)    Glucose, Bld 256 (*)    Creatinine, Ser 1.37 (*)    GFR calc non Af Amer 40 (*)    GFR calc Af Amer 46 (*)    All other components within normal limits  CBC WITH DIFFERENTIAL/PLATELET  I-STAT TROPONIN, ED  CBG MONITORING, ED    EKG  EKG Interpretation  Date/Time:  Thursday October 07 2017 14:35:20  EST Ventricular Rate:  69 PR Interval:  152 QRS Duration: 72 QT Interval:  402 QTC Calculation: 430 R Axis:   -10 Text Interpretation:  Normal sinus rhythm Premature atrial complexes Nonspecific T wave abnormality Abnormal ECG Reconfirmed by Raeford Razor [phone]) on 10/07/2017 7:31:05 PM       Radiology Dg Cervical Spine Complete  Result Date: 10/07/2017 CLINICAL DATA:  Neck and back pain after fall. EXAM: THORACIC SPINE 2 VIEWS; CERVICAL SPINE - COMPLETE 4+ VIEW COMPARISON:  Chest x-ray dated May 05, 2017. Cervical spine x-rays dated November 01, 2012. FINDINGS: Cervical spine: The lateral view is diagnostic to the C7 level. Prior C5-C6 ACDF with solid osseous fusion and no evidence of hardware complication. There is no acute fracture or subluxation. Vertebral body heights are preserved. Alignment is normal. Mild disc height loss at C3-C4 and C4-C5, progressed when compared to prior study. Facet arthropathy at C7-T1. The neural foramina are patent.Normal prevertebral soft tissues. Thoracic spine: Twelve rib-bearing thoracic vertebral bodies. No acute fracture or subluxation. Vertebral body heights are preserved. Alignment is normal. Intervertebral disc spaces are maintained. IMPRESSION: 1. No acute osseous abnormality in the cervical or thoracic spine. 2. Prior C5-C6 ACDF without evidence of hardware complication. 3. Mild degenerative disc disease at C3-C4 and C4-C5. Electronically  Signed   By: Obie DredgeWilliam T Derry M.D.   On: 10/07/2017 15:32   Dg Thoracic Spine 2 View  Result Date: 10/07/2017 CLINICAL DATA:  Neck and back pain after fall. EXAM: THORACIC SPINE 2 VIEWS; CERVICAL SPINE - COMPLETE 4+ VIEW COMPARISON:  Chest x-ray dated May 05, 2017. Cervical spine x-rays dated November 01, 2012. FINDINGS: Cervical spine: The lateral view is diagnostic to the C7 level. Prior C5-C6 ACDF with solid osseous fusion and no evidence of hardware complication. There is no acute fracture or subluxation.  Vertebral body heights are preserved. Alignment is normal. Mild disc height loss at C3-C4 and C4-C5, progressed when compared to prior study. Facet arthropathy at C7-T1. The neural foramina are patent.Normal prevertebral soft tissues. Thoracic spine: Twelve rib-bearing thoracic vertebral bodies. No acute fracture or subluxation. Vertebral body heights are preserved. Alignment is normal. Intervertebral disc spaces are maintained. IMPRESSION: 1. No acute osseous abnormality in the cervical or thoracic spine. 2. Prior C5-C6 ACDF without evidence of hardware complication. 3. Mild degenerative disc disease at C3-C4 and C4-C5. Electronically Signed   By: Obie DredgeWilliam T Derry M.D.   On: 10/07/2017 15:32    Procedures Procedures (including critical care time)  Medications Ordered in ED Medications - No data to display   Initial Impression / Assessment and Plan / ED Course  I have reviewed the triage vital signs and the nursing notes.  Pertinent labs & imaging results that were available during my care of the patient were reviewed by me and considered in my medical decision making (see chart for details).     66yF with syncopal event. Now feeling better. EKG sinus with PACs. HD stable. Labs ok. Acute on chronic pain. Negative imaging. FU with cards.   Final Clinical Impressions(s) / ED Diagnoses   Final diagnoses:  Syncope and collapse    ED Discharge Orders    None       Raeford RazorKohut, Sarena Jezek, MD 10/07/17 [phone]

## 2017-10-07 NOTE — ED Provider Notes (Signed)
Patient placed in Quick Look pathway, seen and evaluated   Chief Complaint: syncope   HPI:   Patient with hx of HTN, A-Fib and DM here today after syncopal episode that happened 2 nights ago. Patient reports that she fell and hurt her neck and upper back when she passed out. Patient called her PCP and was told to come to the ED. Patient reports that she continues to feel dizzy and have neck and back pain after the fall.  BP (!) 143/74 (BP Location: Left Arm)   Pulse 62   Temp 98 F (36.7 C) (Oral)   Resp 16   Ht 5\' 7"  (1.702 m)   Wt 96.2 kg (212 lb)   SpO2 97%   BMI 33.20 kg/m   ROS: Neuro: syncope  M/S: neck and back pain     Physical Exam:   Gen: No distress  Neuro: Awake and Alert  CV: abnormal EKG  Skin: Warm and dry  M/S: tenderness with palpation of C-spine and Thoracic spine.   Focused Exam:    Initiation of care has begun. The patient has been counseled on the process, plan, and necessity for staying for the completion/evaluation, and the remainder of the medical screening examination    Janne Napoleoneese, Michaeline Eckersley M, NP 10/07/17 1451    Raeford RazorKohut, Stephen, MD 10/07/17 1556

## 2017-10-08 ENCOUNTER — Other Ambulatory Visit: Payer: Self-pay | Admitting: *Deleted

## 2017-10-08 ENCOUNTER — Telehealth: Payer: Self-pay | Admitting: Internal Medicine

## 2017-10-08 MED ORDER — ONDANSETRON HCL 4 MG PO TABS: 4.0000 mg | ORAL_TABLET | Freq: Three times a day (TID) | ORAL | 0 refills | Status: DC | PRN

## 2017-10-08 MED ORDER — FLUTICASONE PROPIONATE 50 MCG/ACT NA SUSP: NASAL | 6 refills | Status: DC

## 2017-10-08 MED ORDER — LORATADINE 10 MG PO TABS: 10.0000 mg | ORAL_TABLET | Freq: Every day | ORAL | 0 refills | Status: DC

## 2017-10-08 NOTE — Telephone Encounter (Signed)
Tried to call patient back. No answer and unable to leave voicemail.

## 2017-10-08 NOTE — Telephone Encounter (Signed)
New message    Patient discharged from ED. Patient has questions about follow testing. Patient wants clarification on her diagnosis and next steps of care.

## 2017-10-11 NOTE — Telephone Encounter (Signed)
Spoke with patient in regards to her syncopal episode on 2/21.. She phoned her PCP and was told to go to ED and be evaluated.Marland Kitchen. Upon her dismissal, she was told to try and move her appt with Dr. Johney FrameAllred 4/15 to a sooner time frame...  Patient verbalized understanding

## 2017-10-11 NOTE — Telephone Encounter (Signed)
lpmtcb 2/25 md

## 2017-10-13 ENCOUNTER — Other Ambulatory Visit: Payer: Medicare Other

## 2017-10-13 DIAGNOSIS — R197 Diarrhea, unspecified: Secondary | ICD-10-CM

## 2017-10-14 LAB — FECAL LACTOFERRIN, QUANT
Fecal Lactoferrin: POSITIVE — AB
MICRO NUMBER: 90256572
SPECIMEN QUALITY: ADEQUATE

## 2017-10-17 LAB — CLOSTRIDIUM DIFFICILE BY PCR: CDIFFPCR: NEGATIVE

## 2017-10-18 ENCOUNTER — Encounter: Payer: Self-pay | Admitting: Internal Medicine

## 2017-10-18 ENCOUNTER — Ambulatory Visit (INDEPENDENT_AMBULATORY_CARE_PROVIDER_SITE_OTHER): Payer: Medicare Other | Admitting: Internal Medicine

## 2017-10-18 ENCOUNTER — Telehealth: Payer: Self-pay

## 2017-10-18 VITALS — BP 124/64 | HR 108 | Ht 67.0 in | Wt 215.0 lb

## 2017-10-18 DIAGNOSIS — I480 Paroxysmal atrial fibrillation: Secondary | ICD-10-CM

## 2017-10-18 DIAGNOSIS — I481 Persistent atrial fibrillation: Secondary | ICD-10-CM

## 2017-10-18 DIAGNOSIS — I4819 Other persistent atrial fibrillation: Secondary | ICD-10-CM

## 2017-10-18 DIAGNOSIS — R079 Chest pain, unspecified: Secondary | ICD-10-CM | POA: Diagnosis not present

## 2017-10-18 DIAGNOSIS — R55 Syncope and collapse: Secondary | ICD-10-CM

## 2017-10-18 MED ORDER — METRONIDAZOLE 500 MG PO TABS: 500.0000 mg | ORAL_TABLET | Freq: Three times a day (TID) | ORAL | 0 refills | Status: DC

## 2017-10-18 NOTE — Telephone Encounter (Signed)
-----   Message from Meredith PelPaula M Guenther, NP sent at 10/18/2017 11:39 AM EST ----- Waynetta SandyBeth, please see how she is doing as far as diarrhea. If having significant diarrhea then we could try course of Xifaxan. If unable to get then flagyl 500 mg tid for 10 days. Wouldn't treat if diarrhea improving. Thanks

## 2017-10-18 NOTE — H&P (View-Only) (Signed)
PCP: [name], MD Primary Cardiologist: Dr [name] Primary EP: Dr [name] is a 67 y.o. female who presents today for routine electrophysiology followup.  Since last being seen in our clinic, the patient reports doing reasonably well.  Her afib has returned.  She also had transient LOC 10/07/17 for which she was seen in the ED (Dr [name] note reviewed).  Today, she denies symptoms of palpitations, chest pain, shortness of breath,  lower extremity edema, or further syncope.  The patient is otherwise without complaint today.   Past Medical History:  Diagnosis Date  . Anxiety   . Arthritis    "knees, hands, ankles, feet" (05/12/2016)  . Asthma    Dr. [name]  . Atrial fibrillation (HCC)   . Bipolar disorder (HCC)   . Chronic back pain    "lower and middle" (05/12/2016)  . CKD (chronic kidney disease), stage III (HCC)   . Colon polyps   . Depression   . Diverticulosis   . Epilepsy (HCC)   . Essential hypertension   . Fibromyalgia   . Gallstones   . GERD (gastroesophageal reflux disease)   . H/O hiatal hernia   . Hyperlipidemia   . IBS (irritable bowel syndrome)   . Paroxysmal A-fib (HCC)    a. On [name]. b. Recurrence 08/2014 in setting of GI illness.  . Peripheral neuropathy   . Sarcoidosis of lung (HCC)    Dr. [name]  . Seizures (HCC)    "epileptic; pretty regular recently" (05/12/2016)  . Type II diabetes mellitus (HCC)    Past Surgical History:  Procedure Laterality Date  . ABDOMINAL HYSTERECTOMY  1995  . ANTERIOR CERVICAL DECOMP/DISCECTOMY FUSION  07/11/2012   Procedure: ANTERIOR CERVICAL DECOMPRESSION/DISCECTOMY FUSION 1 LEVEL;  Surgeon: [name], MD;  Location: [facility];  Service: Neurosurgery;  Laterality: N/A;  Cervical Five-Six Anterior Cervical Decompression with Fusion Interbody Prothesis Plating and Bonegraft  . ATRIAL FIBRILLATION ABLATION N/A 05/18/2017   Procedure: Atrial Fibrillation Ablation;  Surgeon: [name], MD;   Location: [facility];  Service: Cardiovascular;  Laterality: N/A;  . CARDIAC CATHETERIZATION  2012   a. Normal coronaries 2012. b. Normal nuc 09/2014.  Marland Kitchen CARDIAC CATHETERIZATION  05/18/2017  . CHOLECYSTECTOMY OPEN  1976  . CLOSED REDUCTION ANKLE FRACTURE Left 10/2006   "got steel rod in my leg; and screws"  . COLON SURGERY    . DILATION AND CURETTAGE OF UTERUS    . ESOPHAGEAL MANOMETRY  03/21/2012   Procedure: ESOPHAGEAL MANOMETRY (EM);  Surgeon: [name], MD;  Location: [facility];  Service: Endoscopy;  Laterality: N/A;  . FRACTURE SURGERY    . NEUROPLASTY / TRANSPOSITION MEDIAN NERVE AT CARPAL TUNNEL Left 2004  . NEUROPLASTY / TRANSPOSITION MEDIAN NERVE AT CARPAL TUNNEL Right 2002  . 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 SALPINGOOPHORECTOMY Bilateral 2000  . TEE WITHOUT CARDIOVERSION N/A 05/18/2017   Procedure: TRANSESOPHAGEAL ECHOCARDIOGRAM (TEE);  Surgeon: [name], MD;  Location: [facility] ENDOSCOPY;  Service: Cardiovascular;  Laterality: N/A;  . TUBAL LIGATION  1982    ROS- all systems are reviewed and negatives except as per HPI above  Current Outpatient Medications  Medication Sig Dispense Refill  . albuterol (PROAIR HFA) 108 (90 Base) MCG/ACT inhaler inhale 2 puffs every 6 hours if needed for wheezing or shortness of breath 8.5 g 1  . calcium carbonate (OS-CAL) 600 MG TABS tablet Take 600 mg by mouth daily.    Marland Kitchen  cholecalciferol (VITAMIN D) 1000 UNITS tablet Take 1,000 Units by mouth 2 (two) times daily.     . Cranberry 500 MG CAPS Take 500 mg by mouth 2 (two) times daily.    Marland Kitchen dofetilide ([name]) 250 MCG capsule take 1 capsule by mouth twice a day 180 capsule 3  . DULERA 100-5 MCG/ACT AERO inhale 2 puffs INTO THE LUNGS every 12 hours 13 g 11  . DULoxetine (CYMBALTA) 60 MG capsule Take 60 mg by mouth at bedtime.     Marland Kitchen esomeprazole (NEXIUM) 40 MG capsule Take 1 capsule (40 mg total) by mouth daily at 12 noon. 30 capsule 3  . EVZIO 0.4 MG/0.4ML SOAJ Inject  0.4 mLs as directed daily as needed (for overdose).   0  . fluticasone (FLONASE) 50 MCG/ACT nasal spray instill 2 sprays into each nostril once daily 16 g 6  . HUMALOG KWIKPEN 100 UNIT/ML KiwkPen Inject 18-32 Units into the skin 3 (three) times daily with meals. PER Patient home SLIDING SCALE 18 units if CBG < 200 32 units  If CBG  > 200  0  . Insulin Glargine (LANTUS SOLOSTAR) 100 UNIT/ML Solostar Pen Inject 30 Units into the skin at bedtime. (Patient taking differently: Inject 50 Units into the skin at bedtime. )    . loperamide (IMODIUM A-D) 2 MG tablet Take 1 tablet (2 mg total) by mouth 4 (four) times daily as needed for diarrhea or loose stools. 30 tablet 0  . loratadine (CLARITIN) 10 MG tablet Take 1 tablet (10 mg total) by mouth daily. 30 tablet 0  . magnesium oxide (MAG-OX) 400 MG tablet Take 1 tablet (400 mg total) by mouth 2 (two) times daily. (Patient taking differently: Take 400 mg by mouth daily. ) 60 tablet 11  . metoprolol tartrate (LOPRESSOR) 50 MG tablet Take 1 tablet (50 mg total) by mouth 2 (two) times daily. 270 tablet 3  . nitroGLYCERIN (NITROSTAT) 0.4 MG SL tablet Place 1 tablet (0.4 mg total) under the tongue every 5 (five) minutes as needed for chest pain (do nto exceed 3 doses). (Patient taking differently: Place 0.4 mg under the tongue every 5 (five) minutes x 3 doses as needed for chest pain. ) 25 tablet 2  . ondansetron (ZOFRAN ODT) 4 MG disintegrating tablet Take 1 tablet (4 mg total) by mouth every 8 (eight) hours as needed for nausea or vomiting. 10 tablet 0  . ondansetron (ZOFRAN) 4 MG tablet Take 1 tablet (4 mg total) by mouth every 8 (eight) hours as needed for nausea or vomiting. 20 tablet 0  . oxyCODONE-acetaminophen (PERCOCET) 10-325 MG tablet Take 1 tablet by mouth 5 (five) times daily. (scheduled)     . Polyvinyl Alcohol-Povidone (REFRESH OP) Place 1 drop into both eyes 3 (three) times daily as needed (for dry eyes).     . potassium chloride (K-DUR) 10 MEQ  tablet take 1 tablet by mouth once daily 90 tablet 3  . pregabalin (LYRICA) 100 MG capsule Take 100 mg by mouth 3 (three) times daily.    . rivaroxaban (XARELTO) 20 MG TABS tablet Take 20 mg by mouth once a day with supper 30 tablet 9  . tamsulosin (FLOMAX) 0.4 MG CAPS capsule Take 0.4 mg by mouth at bedtime.  0  . vitamin C (ASCORBIC ACID) 500 MG tablet Take 500 mg by mouth 2 (two) times daily.    . VOLTAREN 1 % GEL APPLY 2 INCHES TO AFFECTED AREA FOUR TIMES A DAY 500 g 3  .  atorvastatin (LIPITOR) 80 MG tablet Take 80 mg by mouth at bedtime.  0   No current facility-administered medications for this visit.     Physical Exam: Vitals:   10/18/17 1020  BP: 124/64  Pulse: (!) 108  Weight: 215 lb (97.5 kg)  Height: 5\' 7"  (1.702 m)    GEN- The patient is well appearing, alert and oriented x 3 today.   Head- normocephalic, atraumatic Eyes-  Sclera clear, conjunctiva pink Ears- hearing intact Oropharynx- clear Lungs- Clear to ausculation bilaterally, normal work of breathing Heart- irregular rate and rhythm, no murmurs, rubs or gallops, PMI not laterally displaced GI- soft, NT, ND, + BS Extremities- no clubbing, cyanosis, or edema  EKG tracing ordered today is personally reviewed and shows afib, V rate 108 bpm  Assessment and Plan:  1. Afib/ atrial flutter Return of afib is noted chads2vasc score is 4.  She is on xarelto. Therapeutic strategies for afib including medicine (amiodarone) and ablation were discussed in detail with the patient today. Risk, benefits, and alternatives to repeat EP study and radiofrequency ablation for afib were also discussed in detail today. These risks include but are not limited to stroke, bleeding, vascular damage, tamponade, perforation, damage to the esophagus, lungs, and other structures, pulmonary vein stenosis, worsening renal function, and death. The patient understands these risk and wishes to proceed.  We will therefore proceed with catheter  ablation at the next available time.  Will obtain cardiac CT prior to ablation. qtc is 400 msec today.  lbas 10/07/17 reviewed today   2. Sick sinus  Prior post termination pauses have been noted Today we discussed options of repeat ablation and also PPM.  I have strongly advised PPM.  She is very clear that she is not interested in PPM at this time.  She will consider this if she has additional syncope.  Hopefully, if we can control her afib, her post termination syncope will also resolve. No driving x 6 months (pt aware)  3. Obesity Body mass index is 33.67 kg/m. Lifestyle modification encouraged again today  4. HTN Stable No change required today  5. Chest pressure/ SOB Likely due to afib Will protocol cardiac CT to evaluate coronary anatomy also.   Hillis RangeJames Sholom Dulude MD, [facility] 10/18/2017 10:38 AM

## 2017-10-18 NOTE — Patient Instructions (Addendum)
Medication Instructions:  Your physician recommends that you continue on your current medications as directed. Please refer to the Current Medication list given to you today. If you need a refill on your cardiac medications before your next appointment, please call your pharmacy.  Labwork: You will get lab work within 14 days of your procedure:  BMP and CBC. Please schedule.  Testing/Procedures:  Your physician has requested that you have cardiac CT. Cardiac computed tomography (CT) is a painless test that uses an x-ray machine to take clear, detailed pictures of your heart. For further information please visit [email or web address]. Please follow instruction sheet as given.-You will get a call from our office to schedule the date for this test.  Your physician has recommended that you have an ablation. Catheter ablation is a medical procedure used to treat some cardiac arrhythmias (irregular heartbeats). During catheter ablation, a long, thin, flexible tube is put into a blood vessel in your groin (upper thigh), or neck. This tube is called an ablation catheter. It is then guided to your heart through the blood vessel. Radio frequency waves destroy small areas of heart tissue where abnormal heartbeats may cause an arrhythmia to start. Please see the instruction sheet given to you today.  Follow-Up: You will follow up with Rudi Cocoonna Carroll, NP with the afib clinic 4 weeks after your ablation.  You will follow up with Dr. Johney FrameAllred 3 months after your procedure.  Any Other Special Instructions Will Be Listed Below (If Applicable).  ABLATION DIRECTIONS:  [facility] at: 5:30 am on November 16, 2017 Do not eat or drink after midnight prior to procedure On the morning of your procedure do not take any medications. Plan for one night stay.  You will need someone to drive you home at discharge.  [facility] of  [facility]. [facility]  [address] [place], [place] [address] [phone]  Proceed to the [facility] Radiology Department (First Floor).  Please follow these instructions carefully (unless otherwise directed):  On the Night Before the Test: . Drink plenty of water. . Do not consume any caffeinated/decaffeinated beverages or chocolate 12 hours prior to your test. . Do not take any antihistamines 12 hours prior to your test. . If you take Metformin do not take 24 hours prior to test. On the Day of the Test: . Drink plenty of water. Do not drink any water within one hour of the test. . Do not eat any food 4 hours prior to the test. . You may take your regular medications prior to the test. . TAKE this MEDICATION - Take 50 mg of lopressor (metoprolol) one hour before the test.  .  HOLD Furosemide morning of the test.  After the Test: . Drink plenty of water. . After receiving IV contrast, you may experience a mild flushed feeling. This is normal. . On occasion, you may experience a mild rash up to 24 hours after the test. This is not dangerous. If this occurs, you can take Benadryl 25 mg and increase your fluid intake. . If you experience trouble breathing, this can be serious. If it is severe call 911 IMMEDIATELY. If it is mild, please call our office. . If you take any of these medications: Glipizide/Metformin, Avandament, Glucavance, please do not take 48 hours after completing test.

## 2017-10-18 NOTE — Progress Notes (Signed)
PCP: [name], MD Primary Cardiologist: Dr [name] Primary EP: Dr Denyce Robert [name] is a 67 y.o. female who presents today for routine electrophysiology [name].  Since last being seen in our clinic, the patient reports doing reasonably well.  Her afib has returned.  She also had transient LOC 10/07/17 for which she was seen in the ED (Dr [name] note reviewed).  Today, she denies symptoms of palpitations, chest pain, shortness of breath,  lower extremity edema, or further syncope.  The patient is otherwise without complaint today.   Past Medical History:  Diagnosis Date  . Anxiety   . Arthritis    "knees, hands, ankles, feet" (05/12/2016)  . Asthma    Dr. Sherene [name]  . Atrial fibrillation (HCC)   . Bipolar disorder (HCC)   . Chronic back pain    "lower and middle" (05/12/2016)  . CKD (chronic kidney disease), stage III (HCC)   . Colon polyps   . Depression   . Diverticulosis   . Epilepsy (HCC)   . Essential hypertension   . Fibromyalgia   . Gallstones   . GERD (gastroesophageal reflux disease)   . H/O hiatal hernia   . Hyperlipidemia   . IBS (irritable bowel syndrome)   . Paroxysmal A-fib (HCC)    a. On [name]. b. Recurrence 08/2014 in setting of GI illness.  . Peripheral neuropathy   . Sarcoidosis of lung (HCC)    Dr. Sherene [name]  . Seizures (HCC)    "epileptic; pretty regular recently" (05/12/2016)  . Type II diabetes mellitus (HCC)    Past Surgical History:  Procedure Laterality Date  . ABDOMINAL HYSTERECTOMY  1995  . ANTERIOR CERVICAL DECOMP/DISCECTOMY FUSION  07/11/2012   Procedure: ANTERIOR CERVICAL DECOMPRESSION/DISCECTOMY FUSION 1 LEVEL;  Surgeon: Toni Loron, MD;  Location: [facility];  Service: Neurosurgery;  Laterality: N/A;  Cervical Five-Six Anterior Cervical Decompression with Fusion Interbody Prothesis Plating and Bonegraft  . ATRIAL FIBRILLATION ABLATION N/A 05/18/2017   Procedure: Atrial Fibrillation Ablation;  Surgeon: [name], MD;   Location: [facility];  Service: Cardiovascular;  Laterality: N/A;  . CARDIAC CATHETERIZATION  2012   a. Normal coronaries 2012. b. Normal nuc 09/2014.  Marland Kitchen CARDIAC CATHETERIZATION  05/18/2017  . CHOLECYSTECTOMY OPEN  1976  . CLOSED REDUCTION ANKLE FRACTURE Left 10/2006   "got steel rod in my leg; and screws"  . COLON SURGERY    . DILATION AND CURETTAGE OF UTERUS    . ESOPHAGEAL MANOMETRY  03/21/2012   Procedure: ESOPHAGEAL MANOMETRY (EM);  Surgeon: Toni Fee, MD;  Location: [facility];  Service: Endoscopy;  Laterality: N/A;  . FRACTURE SURGERY    . NEUROPLASTY / TRANSPOSITION MEDIAN NERVE AT CARPAL TUNNEL Left 2004  . NEUROPLASTY / TRANSPOSITION MEDIAN NERVE AT CARPAL TUNNEL Right 2002  . 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 SALPINGOOPHORECTOMY Bilateral 2000  . TEE WITHOUT CARDIOVERSION N/A 05/18/2017   Procedure: TRANSESOPHAGEAL ECHOCARDIOGRAM (TEE);  Surgeon: Toni Bunting, MD;  Location: [facility] ENDOSCOPY;  Service: Cardiovascular;  Laterality: N/A;  . TUBAL LIGATION  1982    ROS- all systems are reviewed and negatives except as per HPI above  Current Outpatient Medications  Medication Sig Dispense Refill  . albuterol (PROAIR HFA) 108 (90 Base) MCG/ACT inhaler inhale 2 puffs every 6 hours if needed for wheezing or shortness of breath 8.5 g 1  . calcium carbonate (OS-CAL) 600 MG TABS tablet Take 600 mg by mouth daily.    Marland Kitchen  cholecalciferol (VITAMIN D) 1000 UNITS tablet Take 1,000 Units by mouth 2 (two) times daily.     . Cranberry 500 MG CAPS Take 500 mg by mouth 2 (two) times daily.    Marland Kitchen dofetilide ([name]) 250 MCG capsule take 1 capsule by mouth twice a day 180 capsule 3  . DULERA 100-5 MCG/ACT AERO inhale 2 puffs INTO THE LUNGS every 12 hours 13 g 11  . DULoxetine (CYMBALTA) 60 MG capsule Take 60 mg by mouth at bedtime.     Marland Kitchen esomeprazole (NEXIUM) 40 MG capsule Take 1 capsule (40 mg total) by mouth daily at 12 noon. 30 capsule 3  . EVZIO 0.4 MG/0.4ML SOAJ Inject  0.4 mLs as directed daily as needed (for overdose).   0  . fluticasone (FLONASE) 50 MCG/ACT nasal spray instill 2 sprays into each nostril once daily 16 g 6  . HUMALOG KWIKPEN 100 UNIT/ML KiwkPen Inject 18-32 Units into the skin 3 (three) times daily with meals. PER Patient home SLIDING SCALE 18 units if CBG < 200 32 units  If CBG  > 200  0  . Insulin Glargine (LANTUS SOLOSTAR) 100 UNIT/ML Solostar Pen Inject 30 Units into the skin at bedtime. (Patient taking differently: Inject 50 Units into the skin at bedtime. )    . loperamide (IMODIUM A-D) 2 MG tablet Take 1 tablet (2 mg total) by mouth 4 (four) times daily as needed for diarrhea or loose stools. 30 tablet 0  . loratadine (CLARITIN) 10 MG tablet Take 1 tablet (10 mg total) by mouth daily. 30 tablet 0  . magnesium oxide (MAG-OX) 400 MG tablet Take 1 tablet (400 mg total) by mouth 2 (two) times daily. (Patient taking differently: Take 400 mg by mouth daily. ) 60 tablet 11  . metoprolol tartrate (LOPRESSOR) 50 MG tablet Take 1 tablet (50 mg total) by mouth 2 (two) times daily. 270 tablet 3  . nitroGLYCERIN (NITROSTAT) 0.4 MG SL tablet Place 1 tablet (0.4 mg total) under the tongue every 5 (five) minutes as needed for chest pain (do nto exceed 3 doses). (Patient taking differently: Place 0.4 mg under the tongue every 5 (five) minutes x 3 doses as needed for chest pain. ) 25 tablet 2  . ondansetron (ZOFRAN ODT) 4 MG disintegrating tablet Take 1 tablet (4 mg total) by mouth every 8 (eight) hours as needed for nausea or vomiting. 10 tablet 0  . ondansetron (ZOFRAN) 4 MG tablet Take 1 tablet (4 mg total) by mouth every 8 (eight) hours as needed for nausea or vomiting. 20 tablet 0  . oxyCODONE-acetaminophen (PERCOCET) 10-325 MG tablet Take 1 tablet by mouth 5 (five) times daily. (scheduled)     . Polyvinyl Alcohol-Povidone (REFRESH OP) Place 1 drop into both eyes 3 (three) times daily as needed (for dry eyes).     . potassium chloride (K-DUR) 10 MEQ  tablet take 1 tablet by mouth once daily 90 tablet 3  . pregabalin (LYRICA) 100 MG capsule Take 100 mg by mouth 3 (three) times daily.    . rivaroxaban (XARELTO) 20 MG TABS tablet Take 20 mg by mouth once a day with supper 30 tablet 9  . tamsulosin (FLOMAX) 0.4 MG CAPS capsule Take 0.4 mg by mouth at bedtime.  0  . vitamin C (ASCORBIC ACID) 500 MG tablet Take 500 mg by mouth 2 (two) times daily.    . VOLTAREN 1 % GEL APPLY 2 INCHES TO AFFECTED AREA FOUR TIMES A DAY 500 g 3  .  atorvastatin (LIPITOR) 80 MG tablet Take 80 mg by mouth at bedtime.  0   No current facility-administered medications for this visit.     Physical Exam: Vitals:   10/18/17 1020  BP: 124/64  Pulse: (!) 108  Weight: 215 lb (97.5 kg)  Height: 5\' 7"  (1.702 m)    GEN- The patient is well appearing, alert and oriented x 3 today.   Head- normocephalic, atraumatic Eyes-  Sclera clear, conjunctiva pink Ears- hearing intact Oropharynx- clear Lungs- Clear to ausculation bilaterally, normal work of breathing Heart- irregular rate and rhythm, no murmurs, rubs or gallops, PMI not laterally displaced GI- soft, NT, ND, + BS Extremities- no clubbing, cyanosis, or edema  EKG tracing ordered today is personally reviewed and shows afib, V rate 108 bpm  Assessment and Plan:  1. Afib/ atrial flutter Return of afib is noted chads2vasc score is 4.  She is on xarelto. Therapeutic strategies for afib including medicine (amiodarone) and ablation were discussed in detail with the patient today. Risk, benefits, and alternatives to repeat EP study and radiofrequency ablation for afib were also discussed in detail today. These risks include but are not limited to stroke, bleeding, vascular damage, tamponade, perforation, damage to the esophagus, lungs, and other structures, pulmonary vein stenosis, worsening renal function, and death. The patient understands these risk and wishes to proceed.  We will therefore proceed with catheter  ablation at the next available time.  Will obtain cardiac CT prior to ablation. qtc is 400 msec today.  lbas 10/07/17 reviewed today   2. Sick sinus  Prior post termination pauses have been noted Today we discussed options of repeat ablation and also PPM.  I have strongly advised PPM.  She is very clear that she is not interested in PPM at this time.  She will consider this if she has additional syncope.  Hopefully, if we can control her afib, her post termination syncope will also resolve. No driving x 6 months (pt aware)  3. Obesity Body mass index is 33.67 kg/m. Lifestyle modification encouraged again today  4. HTN Stable No change required today  5. Chest pressure/ SOB Likely due to afib Will protocol cardiac CT to evaluate coronary anatomy also.   Hillis RangeJames Mace Weinberg MD, [facility] 10/18/2017 10:38 AM

## 2017-10-18 NOTE — Telephone Encounter (Signed)
Spoke with the patient. She is aware of her stool test results. She states her symptoms persist without improvement. She is having 3 to 4 loose stools daily. She is interested in trying antibiotics. Flagyl 500 mg tid to [facility] on [address]

## 2017-10-22 LAB — HM DIABETES EYE EXAM

## 2017-10-26 ENCOUNTER — Ambulatory Visit: Payer: Medicare Other | Admitting: Nurse Practitioner

## 2017-11-02 ENCOUNTER — Telehealth: Payer: Self-pay | Admitting: Internal Medicine

## 2017-11-02 NOTE — Telephone Encounter (Signed)
[name] calling, states that she has an ablation scheduled for 11-16-17. [name] states that Dr. Johney FrameAllred had mentioned that she may need pacemaker and [name] would like to discuss further in detail.

## 2017-11-02 NOTE — Telephone Encounter (Signed)
Pt spoke with her daughters. She is ready to proceed with PPM now.  States that she doesn't think she needs the ablation since she is proceeding with PPM. Pt aware Dr. Johney FrameAllred and his nurse will discuss tomorrow and let her know his recommendation.

## 2017-11-04 NOTE — Telephone Encounter (Signed)
Discussed with Dr. Johney FrameAllred if he would rather have patient go forward with a pacemaker or with the afib ablation that has already been planned.  Per Dr. Johney FrameAllred, he advises maintaining current course of action.  Call placed to Pt.  Notified Pt that Dr. Johney FrameAllred advises ablation at this time.  Pt indicates understanding.  Advised Pt she can have her daughters call if they have further questions.

## 2017-11-05 ENCOUNTER — Other Ambulatory Visit: Payer: Medicare Other

## 2017-11-05 DIAGNOSIS — I480 Paroxysmal atrial fibrillation: Secondary | ICD-10-CM

## 2017-11-05 DIAGNOSIS — R55 Syncope and collapse: Secondary | ICD-10-CM

## 2017-11-05 LAB — CBC WITH DIFFERENTIAL/PLATELET
BASOS ABS: 0 10*3/uL (ref 0.0–0.2)
Basos: 0 %
EOS (ABSOLUTE): 0.1 10*3/uL (ref 0.0–0.4)
Eos: 1 %
HEMOGLOBIN: 13.3 g/dL (ref 11.1–15.9)
Hematocrit: 39.8 % (ref 34.0–46.6)
IMMATURE GRANS (ABS): 0 10*3/uL (ref 0.0–0.1)
Immature Granulocytes: 0 %
LYMPHS: 37 %
Lymphocytes Absolute: 1.4 10*3/uL (ref 0.7–3.1)
MCH: 29 pg (ref 26.6–33.0)
MCHC: 33.4 g/dL (ref 31.5–35.7)
MCV: 87 fL (ref 79–97)
MONOCYTES: 5 %
Monocytes Absolute: 0.2 10*3/uL (ref 0.1–0.9)
Neutrophils Absolute: 2.2 10*3/uL (ref 1.4–7.0)
Neutrophils: 57 %
PLATELETS: 198 10*3/uL (ref 150–379)
RBC: 4.58 x10E6/uL (ref 3.77–5.28)
RDW: 14.3 % (ref 12.3–15.4)
WBC: 3.9 10*3/uL (ref 3.4–10.8)

## 2017-11-05 LAB — BASIC METABOLIC PANEL
BUN / CREAT RATIO: 14 (ref 12–28)
BUN: 17 mg/dL (ref 8–27)
CALCIUM: 9 mg/dL (ref 8.7–10.3)
CHLORIDE: 102 mmol/L (ref 96–106)
CO2: 20 mmol/L (ref 20–29)
CREATININE: 1.24 mg/dL — AB (ref 0.57–1.00)
GFR calc Af Amer: 53 mL/min/{1.73_m2} — ABNORMAL LOW (ref >59)
GFR calc non Af Amer: 46 mL/min/{1.73_m2} — ABNORMAL LOW (ref >59)
GLUCOSE: 230 mg/dL — AB (ref 65–99)
Potassium: 4.7 mmol/L (ref 3.5–5.2)
Sodium: 138 mmol/L (ref 134–144)

## 2017-11-10 ENCOUNTER — Ambulatory Visit (HOSPITAL_COMMUNITY)
Admission: RE | Admit: 2017-11-10 | Discharge: 2017-11-10 | Disposition: A | Discharge status: Home / Self Care | Payer: Medicare Other | Source: Ambulatory Visit | Attending: Internal Medicine | Admitting: Internal Medicine

## 2017-11-10 ENCOUNTER — Encounter (HOSPITAL_COMMUNITY): Payer: Self-pay

## 2017-11-10 ENCOUNTER — Ambulatory Visit (HOSPITAL_COMMUNITY): Payer: Medicare Other

## 2017-11-10 DIAGNOSIS — R079 Chest pain, unspecified: Secondary | ICD-10-CM

## 2017-11-10 DIAGNOSIS — D869 Sarcoidosis, unspecified: Secondary | ICD-10-CM | POA: Diagnosis not present

## 2017-11-10 DIAGNOSIS — R590 Localized enlarged lymph nodes: Secondary | ICD-10-CM | POA: Diagnosis not present

## 2017-11-10 DIAGNOSIS — I481 Persistent atrial fibrillation: Secondary | ICD-10-CM | POA: Diagnosis present

## 2017-11-10 DIAGNOSIS — I4819 Other persistent atrial fibrillation: Secondary | ICD-10-CM

## 2017-11-10 DIAGNOSIS — I4891 Unspecified atrial fibrillation: Secondary | ICD-10-CM | POA: Diagnosis not present

## 2017-11-10 DIAGNOSIS — I480 Paroxysmal atrial fibrillation: Secondary | ICD-10-CM | POA: Insufficient documentation

## 2017-11-10 MED ORDER — IOPAMIDOL (ISOVUE-370) INJECTION 76%
INTRAVENOUS | Status: AC
  Administered 2017-11-10: 80 mL
  Filled 2017-11-10: qty 100

## 2017-11-15 NOTE — Anesthesia Preprocedure Evaluation (Addendum)
Anesthesia Evaluation  Patient identified by MRN, date of birth, ID band Patient awake    Reviewed: Allergy & Precautions, NPO status , Patient's Chart, lab work & pertinent test results, reviewed documented beta blocker date and time   Airway Mallampati: II  TM Distance: >3 FB Neck ROM: Full    Dental  (+) Partial Upper   Pulmonary asthma ,    Pulmonary exam normal breath sounds clear to auscultation       Cardiovascular hypertension, Pt. on home beta blockers and Pt. on medications + angina Normal cardiovascular exam+ dysrhythmias Atrial Fibrillation  Rhythm:Regular Rate:Normal  ECG: A-fib, RVR, rate 108  ECHO: Normal LV function; mild LAE; no LAA thrombus; atrial septal aneurysm; mild TR.   Neuro/Psych Seizures -, Well Controlled,  PSYCHIATRIC DISORDERS Anxiety Depression Bipolar Disorder    GI/Hepatic Neg liver ROS, hiatal hernia, GERD  Medicated,IBS (irritable bowel syndrome)   Endo/Other  diabetes, Insulin Dependent  Renal/GU negative Renal ROS     Musculoskeletal  (+) Fibromyalgia -  Abdominal (+) + obese,   Peds  Hematology HLD   Anesthesia Other Findings afib  Reproductive/Obstetrics                            Anesthesia Physical Anesthesia Plan  ASA: IV  Anesthesia Plan: General   Post-op Pain Management:    Induction: Intravenous  PONV Risk Score and Plan: 3  Airway Management Planned: Oral ETT  Additional Equipment:   Intra-op Plan:   Post-operative Plan: Extubation in OR  Informed Consent: I have reviewed the patients History and Physical, chart, labs and discussed the procedure including the risks, benefits and alternatives for the proposed anesthesia with the patient or authorized representative who has indicated his/her understanding and acceptance.   Dental advisory given  Plan Discussed with: CRNA  Anesthesia Plan Comments:        Anesthesia Quick  Evaluation

## 2017-11-16 ENCOUNTER — Ambulatory Visit (HOSPITAL_COMMUNITY)
Admission: RE | Admit: 2017-11-16 | Discharge: 2017-11-18 | Disposition: A | Discharge status: Home / Self Care | Payer: Medicare Other | Source: Ambulatory Visit | Attending: Internal Medicine | Admitting: Internal Medicine

## 2017-11-16 ENCOUNTER — Encounter (HOSPITAL_COMMUNITY)
Admission: RE | Disposition: A | Discharge status: Home / Self Care | Payer: Self-pay | Source: Ambulatory Visit | Attending: Internal Medicine

## 2017-11-16 ENCOUNTER — Encounter (HOSPITAL_COMMUNITY): Payer: Self-pay | Admitting: Certified Registered"

## 2017-11-16 ENCOUNTER — Other Ambulatory Visit: Payer: Self-pay

## 2017-11-16 ENCOUNTER — Ambulatory Visit (HOSPITAL_COMMUNITY): Payer: Medicare Other | Admitting: Anesthesiology

## 2017-11-16 DIAGNOSIS — I10 Essential (primary) hypertension: Secondary | ICD-10-CM | POA: Diagnosis not present

## 2017-11-16 DIAGNOSIS — Z7901 Long term (current) use of anticoagulants: Secondary | ICD-10-CM | POA: Diagnosis not present

## 2017-11-16 DIAGNOSIS — M797 Fibromyalgia: Secondary | ICD-10-CM | POA: Insufficient documentation

## 2017-11-16 DIAGNOSIS — J45909 Unspecified asthma, uncomplicated: Secondary | ICD-10-CM | POA: Insufficient documentation

## 2017-11-16 DIAGNOSIS — Z23 Encounter for immunization: Secondary | ICD-10-CM | POA: Diagnosis not present

## 2017-11-16 DIAGNOSIS — N183 Chronic kidney disease, stage 3 (moderate): Secondary | ICD-10-CM | POA: Diagnosis not present

## 2017-11-16 DIAGNOSIS — E785 Hyperlipidemia, unspecified: Secondary | ICD-10-CM | POA: Insufficient documentation

## 2017-11-16 DIAGNOSIS — G8929 Other chronic pain: Secondary | ICD-10-CM | POA: Diagnosis not present

## 2017-11-16 DIAGNOSIS — Z79899 Other long term (current) drug therapy: Secondary | ICD-10-CM | POA: Insufficient documentation

## 2017-11-16 DIAGNOSIS — Z6833 Body mass index (BMI) 33.0-33.9, adult: Secondary | ICD-10-CM | POA: Diagnosis not present

## 2017-11-16 DIAGNOSIS — Z888 Allergy status to other drugs, medicaments and biological substances status: Secondary | ICD-10-CM | POA: Insufficient documentation

## 2017-11-16 DIAGNOSIS — I480 Paroxysmal atrial fibrillation: Secondary | ICD-10-CM | POA: Diagnosis present

## 2017-11-16 DIAGNOSIS — M549 Dorsalgia, unspecified: Secondary | ICD-10-CM | POA: Diagnosis not present

## 2017-11-16 DIAGNOSIS — I483 Typical atrial flutter: Secondary | ICD-10-CM | POA: Diagnosis not present

## 2017-11-16 DIAGNOSIS — M1990 Unspecified osteoarthritis, unspecified site: Secondary | ICD-10-CM | POA: Insufficient documentation

## 2017-11-16 DIAGNOSIS — Z794 Long term (current) use of insulin: Secondary | ICD-10-CM | POA: Diagnosis not present

## 2017-11-16 DIAGNOSIS — E669 Obesity, unspecified: Secondary | ICD-10-CM | POA: Insufficient documentation

## 2017-11-16 DIAGNOSIS — E1122 Type 2 diabetes mellitus with diabetic chronic kidney disease: Secondary | ICD-10-CM | POA: Insufficient documentation

## 2017-11-16 HISTORY — PX: ATRIAL FIBRILLATION ABLATION: EP1191

## 2017-11-16 LAB — POCT ACTIVATED CLOTTING TIME
Activated Clotting Time: 340 seconds
Activated Clotting Time: 191 seconds
Activated Clotting Time: 197 seconds
Activated Clotting Time: 202 seconds

## 2017-11-16 LAB — GLUCOSE, CAPILLARY
GLUCOSE-CAPILLARY: 237 mg/dL — AB (ref 65–99)
Glucose-Capillary: 126 mg/dL — ABNORMAL HIGH (ref 65–99)
Glucose-Capillary: 144 mg/dL — ABNORMAL HIGH (ref 65–99)
Glucose-Capillary: 196 mg/dL — ABNORMAL HIGH (ref 65–99)

## 2017-11-16 SURGERY — ATRIAL FIBRILLATION ABLATION
Anesthesia: General

## 2017-11-16 MED ORDER — HEPARIN SODIUM (PORCINE) 1000 UNIT/ML IJ SOLN
INTRAMUSCULAR | Status: DC | PRN
  Administered 2017-11-16: 2000 [IU] via INTRAVENOUS

## 2017-11-16 MED ORDER — OXYCODONE-ACETAMINOPHEN 5-325 MG PO TABS
1.0000 | ORAL_TABLET | Freq: Four times a day (QID) | ORAL | Status: DC | PRN
Start: 2017-11-16 — End: 2017-11-18
  Administered 2017-11-17 – 2017-11-18 (×3): 1 via ORAL
  Filled 2017-11-16 (×4): qty 1

## 2017-11-16 MED ORDER — SODIUM CHLORIDE 0.9% FLUSH
3.0000 mL | Freq: Two times a day (BID) | INTRAVENOUS | Status: DC
  Administered 2017-11-16 – 2017-11-17 (×2): 3 mL via INTRAVENOUS

## 2017-11-16 MED ORDER — SODIUM CHLORIDE 0.9 % IV SOLN: 250.0000 mL | INTRAVENOUS | Status: DC | PRN

## 2017-11-16 MED ORDER — INSULIN ASPART 100 UNIT/ML ~~LOC~~ SOLN
0.0000 [IU] | Freq: Three times a day (TID) | SUBCUTANEOUS | Status: DC
Start: 2017-11-16 — End: 2017-11-18
  Administered 2017-11-16 – 2017-11-17 (×2): 3 [IU] via SUBCUTANEOUS
  Administered 2017-11-17: 5 [IU] via SUBCUTANEOUS
  Administered 2017-11-17: 2 [IU] via SUBCUTANEOUS
  Administered 2017-11-18 (×2): 3 [IU] via SUBCUTANEOUS

## 2017-11-16 MED ORDER — PHENYLEPHRINE HCL 10 MG/ML IJ SOLN: INTRAVENOUS | Status: DC | PRN

## 2017-11-16 MED ORDER — PROMETHAZINE HCL 25 MG/ML IJ SOLN: 6.2500 mg | INTRAMUSCULAR | Status: DC | PRN

## 2017-11-16 MED ORDER — OXYCODONE-ACETAMINOPHEN 10-325 MG PO TABS: 1.0000 | ORAL_TABLET | Freq: Four times a day (QID) | ORAL | Status: DC | PRN

## 2017-11-16 MED ORDER — BUPIVACAINE HCL (PF) 0.25 % IJ SOLN
INTRAMUSCULAR | Status: DC | PRN
  Administered 2017-11-16: 30 mL

## 2017-11-16 MED ORDER — INSULIN ASPART 100 UNIT/ML ~~LOC~~ SOLN
0.0000 [IU] | Freq: Every day | SUBCUTANEOUS | Status: DC
Start: 2017-11-16 — End: 2017-11-18

## 2017-11-16 MED ORDER — LIDOCAINE 2% (20 MG/ML) 5 ML SYRINGE
INTRAMUSCULAR | Status: DC | PRN
  Administered 2017-11-16: 80 mg via INTRAVENOUS

## 2017-11-16 MED ORDER — HYDROCODONE-ACETAMINOPHEN 5-325 MG PO TABS
1.0000 | ORAL_TABLET | ORAL | Status: DC | PRN
  Administered 2017-11-16: 1 via ORAL
  Administered 2017-11-16 – 2017-11-17 (×3): 2 via ORAL
  Filled 2017-11-16 (×3): qty 2

## 2017-11-16 MED ORDER — SODIUM CHLORIDE 0.9 % IV SOLN
INTRAVENOUS | Status: DC
  Administered 2017-11-16: 07:00:00 via INTRAVENOUS

## 2017-11-16 MED ORDER — PHENYLEPHRINE 40 MCG/ML (10ML) SYRINGE FOR IV PUSH (FOR BLOOD PRESSURE SUPPORT)
PREFILLED_SYRINGE | INTRAVENOUS | Status: DC | PRN
  Administered 2017-11-16 (×3): 80 ug via INTRAVENOUS
  Administered 2017-11-16: 40 ug via INTRAVENOUS
  Administered 2017-11-16: 80 ug via INTRAVENOUS
  Administered 2017-11-16: 40 ug via INTRAVENOUS
  Administered 2017-11-16 (×2): 80 ug via INTRAVENOUS

## 2017-11-16 MED ORDER — FENTANYL CITRATE (PF) 100 MCG/2ML IJ SOLN
INTRAMUSCULAR | Status: DC | PRN
  Administered 2017-11-16 (×2): 50 ug via INTRAVENOUS

## 2017-11-16 MED ORDER — ONDANSETRON HCL 4 MG/2ML IJ SOLN
INTRAMUSCULAR | Status: DC | PRN
  Administered 2017-11-16: 4 mg via INTRAVENOUS

## 2017-11-16 MED ORDER — INSULIN GLARGINE 100 UNIT/ML ~~LOC~~ SOLN
30.0000 [IU] | Freq: Every day | SUBCUTANEOUS | Status: DC
  Administered 2017-11-16 – 2017-11-17 (×2): 30 [IU] via SUBCUTANEOUS
  Filled 2017-11-16 (×3): qty 0.3

## 2017-11-16 MED ORDER — ISOPROTERENOL HCL 0.2 MG/ML IJ SOLN
INTRAVENOUS | Status: DC | PRN
  Administered 2017-11-16: 20 ug/min via INTRAVENOUS

## 2017-11-16 MED ORDER — ONDANSETRON HCL 4 MG/2ML IJ SOLN: 4.0000 mg | Freq: Four times a day (QID) | INTRAMUSCULAR | Status: DC | PRN

## 2017-11-16 MED ORDER — ACETAMINOPHEN 325 MG PO TABS: 650.0000 mg | ORAL_TABLET | ORAL | Status: DC | PRN

## 2017-11-16 MED ORDER — IOPAMIDOL (ISOVUE-370) INJECTION 76%
INTRAVENOUS | Status: DC | PRN
  Administered 2017-11-16 (×2): 3 mL

## 2017-11-16 MED ORDER — HEPARIN SODIUM (PORCINE) 1000 UNIT/ML IJ SOLN
INTRAMUSCULAR | Status: AC
  Filled 2017-11-16: qty 1

## 2017-11-16 MED ORDER — HYDROMORPHONE HCL 1 MG/ML IJ SOLN: 0.2500 mg | INTRAMUSCULAR | Status: DC | PRN

## 2017-11-16 MED ORDER — ADENOSINE 6 MG/2ML IV SOLN
INTRAVENOUS | Status: DC | PRN
  Administered 2017-11-16 (×2): 12 mg via INTRAVENOUS

## 2017-11-16 MED ORDER — DOFETILIDE 250 MCG PO CAPS
250.0000 ug | ORAL_CAPSULE | Freq: Two times a day (BID) | ORAL | Status: DC
  Administered 2017-11-16 – 2017-11-18 (×4): 250 ug via ORAL
  Filled 2017-11-16 (×4): qty 1

## 2017-11-16 MED ORDER — POTASSIUM CHLORIDE CRYS ER 10 MEQ PO TBCR
10.0000 meq | EXTENDED_RELEASE_TABLET | Freq: Every day | ORAL | Status: DC
  Administered 2017-11-17 – 2017-11-18 (×2): 10 meq via ORAL
  Filled 2017-11-16 (×3): qty 1

## 2017-11-16 MED ORDER — SODIUM CHLORIDE 0.9% FLUSH: 3.0000 mL | INTRAVENOUS | Status: DC | PRN

## 2017-11-16 MED ORDER — HEPARIN (PORCINE) IN NACL 2-0.9 UNIT/ML-% IJ SOLN
INTRAMUSCULAR | Status: AC | PRN
  Administered 2017-11-16: 500 mL

## 2017-11-16 MED ORDER — PREGABALIN 50 MG PO CAPS
100.0000 mg | ORAL_CAPSULE | Freq: Three times a day (TID) | ORAL | Status: DC
  Administered 2017-11-16 – 2017-11-18 (×6): 100 mg via ORAL
  Filled 2017-11-16 (×6): qty 2

## 2017-11-16 MED ORDER — PROTAMINE SULFATE 10 MG/ML IV SOLN
INTRAVENOUS | Status: DC | PRN
  Administered 2017-11-16: 30 mg via INTRAVENOUS

## 2017-11-16 MED ORDER — PNEUMOCOCCAL VAC POLYVALENT 25 MCG/0.5ML IJ INJ
0.5000 mL | INJECTION | INTRAMUSCULAR | Status: AC
  Administered 2017-11-17: 0.5 mL via INTRAMUSCULAR
  Filled 2017-11-16: qty 0.5

## 2017-11-16 MED ORDER — MAGNESIUM OXIDE 400 (241.3 MG) MG PO TABS
400.0000 mg | ORAL_TABLET | Freq: Two times a day (BID) | ORAL | Status: DC
  Administered 2017-11-17 – 2017-11-18 (×3): 400 mg via ORAL
  Filled 2017-11-16 (×3): qty 1

## 2017-11-16 MED ORDER — RIVAROXABAN 20 MG PO TABS
20.0000 mg | ORAL_TABLET | Freq: Every day | ORAL | Status: DC
  Administered 2017-11-16 – 2017-11-17 (×2): 20 mg via ORAL
  Filled 2017-11-16 (×2): qty 1

## 2017-11-16 MED ORDER — SUGAMMADEX SODIUM 200 MG/2ML IV SOLN
INTRAVENOUS | Status: DC | PRN
  Administered 2017-11-16: 200 mg via INTRAVENOUS

## 2017-11-16 MED ORDER — IOPAMIDOL (ISOVUE-370) INJECTION 76%
INTRAVENOUS | Status: AC
  Filled 2017-11-16: qty 50

## 2017-11-16 MED ORDER — ROCURONIUM BROMIDE 100 MG/10ML IV SOLN
INTRAVENOUS | Status: DC | PRN
  Administered 2017-11-16: 20 mg via INTRAVENOUS
  Administered 2017-11-16: 10 mg via INTRAVENOUS
  Administered 2017-11-16: 50 mg via INTRAVENOUS
  Administered 2017-11-16: 20 mg via INTRAVENOUS

## 2017-11-16 MED ORDER — BUPIVACAINE HCL (PF) 0.25 % IJ SOLN
INTRAMUSCULAR | Status: AC
  Filled 2017-11-16: qty 30

## 2017-11-16 MED ORDER — HEPARIN (PORCINE) IN NACL 2-0.9 UNIT/ML-% IJ SOLN
INTRAMUSCULAR | Status: AC
  Filled 2017-11-16: qty 500

## 2017-11-16 MED ORDER — HYDROCODONE-ACETAMINOPHEN 5-325 MG PO TABS
ORAL_TABLET | ORAL | Status: AC
  Filled 2017-11-16: qty 1

## 2017-11-16 MED ORDER — ADENOSINE 6 MG/2ML IV SOLN
INTRAVENOUS | Status: AC
  Filled 2017-11-16: qty 8

## 2017-11-16 MED ORDER — OXYCODONE HCL 5 MG PO TABS
5.0000 mg | ORAL_TABLET | Freq: Four times a day (QID) | ORAL | Status: DC | PRN
  Administered 2017-11-17 – 2017-11-18 (×3): 5 mg via ORAL
  Filled 2017-11-16 (×4): qty 1

## 2017-11-16 MED ORDER — DULOXETINE HCL 60 MG PO CPEP
60.0000 mg | ORAL_CAPSULE | Freq: Every day | ORAL | Status: DC
  Administered 2017-11-16 – 2017-11-17 (×2): 60 mg via ORAL
  Filled 2017-11-16 (×2): qty 1

## 2017-11-16 MED ORDER — ISOPROTERENOL HCL 0.2 MG/ML IJ SOLN
INTRAMUSCULAR | Status: AC
  Filled 2017-11-16: qty 5

## 2017-11-16 MED ORDER — PROPOFOL 10 MG/ML IV BOLUS
INTRAVENOUS | Status: DC | PRN
  Administered 2017-11-16: 150 mg via INTRAVENOUS

## 2017-11-16 MED ORDER — TAMSULOSIN HCL 0.4 MG PO CAPS
0.4000 mg | ORAL_CAPSULE | Freq: Every day | ORAL | Status: DC
  Administered 2017-11-16 – 2017-11-17 (×2): 0.4 mg via ORAL
  Filled 2017-11-16 (×2): qty 1

## 2017-11-16 MED ORDER — HEPARIN SODIUM (PORCINE) 1000 UNIT/ML IJ SOLN
INTRAMUSCULAR | Status: DC | PRN
  Administered 2017-11-16: 1000 [IU] via INTRAVENOUS
  Administered 2017-11-16: 12000 [IU] via INTRAVENOUS

## 2017-11-16 SURGICAL SUPPLY — 19 items
BLANKET WARM UNDERBOD FULL ACC (MISCELLANEOUS) ×3 IMPLANT
CATH MAPPNG PENTARAY F 2-6-2MM (CATHETERS) IMPLANT
CATH NAVISTAR SMARTTOUCH DF (ABLATOR) ×2 IMPLANT
CATH SOUNDSTAR 3D IMAGING (CATHETERS) ×2 IMPLANT
CATH WEBSTER BI DIR CS D-F CRV (CATHETERS) ×2 IMPLANT
NDL BAYLIS TRANSSEPTAL 71CM (NEEDLE) IMPLANT
NDL TRANSEP BRK 71CM 407200 (NEEDLE) IMPLANT
NEEDLE BAYLIS TRANSSEPTAL 71CM (NEEDLE) ×3 IMPLANT
NEEDLE TRANSEP BRK 71CM 407200 (NEEDLE) ×3 IMPLANT
PACK EP LATEX FREE (CUSTOM PROCEDURE TRAY) ×3
PACK EP LF (CUSTOM PROCEDURE TRAY) ×1 IMPLANT
PAD DEFIB LIFELINK (PAD) ×3 IMPLANT
PATCH CARTO3 (PAD) ×2 IMPLANT
PENTARAY F 2-6-2MM (CATHETERS) ×3
SHEATH AVANTI 11CM 7FR (SHEATH) ×4 IMPLANT
SHEATH AVANTI 11CM 9FR (SHEATH) ×2 IMPLANT
SHEATH AVANTI 11F 11CM (SHEATH) ×2 IMPLANT
SHEATH SWARTZ TS SL2 63CM 8.5F (SHEATH) ×2 IMPLANT
TUBING SMART ABLATE COOLFLOW (TUBING) ×2 IMPLANT

## 2017-11-16 NOTE — Interval H&P Note (Signed)
History and Physical Interval Note:  11/16/2017 7:26 AM  Toni SciaraLinda F Adan  has presented today for surgery, with the diagnosis of afib  The various methods of treatment have been discussed with the patient and family. After consideration of risks, benefits and other options for treatment, the patient has consented to  Procedure(s): ATRIAL FIBRILLATION ABLATION (N/A) as a surgical intervention .  The patient's history has been reviewed, patient examined, no change in status, stable for surgery.  I have reviewed the patient's chart and labs.  Questions were answered to the patient's satisfaction.    Cardiac CT reviewed with the patient.  She continues to have syncope, likely due to post termination pauses but declines pacing. Will proceed with AF ablation in hopes of resolution of post termination pauses.  She reports compliance with anticoagulation without interruption. Hillis RangeJames Darnise Montag

## 2017-11-16 NOTE — Discharge Instructions (Addendum)
Post procedure care instructions No lifting over 5 lbs for 1 week. No vigorous or sexual activity for 1 week. You may return to work on 11/23/17. Keep procedure site clean & dry. If you notice increased pain, swelling, bleeding or pus, call/return!  You may shower, but no soaking baths/hot tubs/pools for 1 week.      You have an appointment set up with the [facility].  Multiple studies have shown that being followed by a dedicated atrial fibrillation clinic in addition to the standard care you receive from your other physicians improves health. We believe that enrollment in the atrial fibrillation clinic will allow us to better care for you.   The phone number to the Atrial Fibrillation Clinic is [phone]. The clinic is staffed Monday through Friday from 8:30am to 5pm.  Parking Directions: The clinic is located in the Heart and Vascular Building connected to [facility]. 1)From [address] on to [address] and go to the 3rd entrance  (Heart and Vascular entrance) on the right. 2)Look to the right for Heart &Vascular Parking Garage. 3)A code for the entrance is required please call the clinic to receive this.   4)Take the elevators to the 1st floor. Registration is in the room with the glass walls at the end of the hallway.  If you have any trouble parking or locating the clinic, please dont hesitate to call [phone].   =============================================================================================  Information on my medicine - XARELTO (Rivaroxaban)  Why was Xarelto prescribed for you? Xarelto was prescribed for you to reduce the risk of a blood clot forming that can cause a stroke if you have a medical condition called atrial fibrillation (a type of irregular heartbeat).  What do you need to know about xarelto ? Take your Xarelto ONCE DAILY at the same time every day with your evening meal. If you have difficulty swallowing  the tablet whole, you may crush it and mix in applesauce just prior to taking your dose.  Take Xarelto exactly as prescribed by your doctor and DO NOT stop taking Xarelto without talking to the doctor who prescribed the medication.  Stopping without other stroke prevention medication to take the place of Xarelto may increase your risk of developing a clot that causes a stroke.  Refill your prescription before you run out.  After discharge, you should have regular check-up appointments with your healthcare provider that is prescribing your Xarelto.  In the future your dose may need to be changed if your kidney function or weight changes by a significant amount.  What do you do if you miss a dose? If you are taking Xarelto ONCE DAILY and you miss a dose, take it as soon as you remember on the same day then continue your regularly scheduled once daily regimen the next day. Do not take two doses of Xarelto at the same time or on the same day.   Important Safety Information A possible side effect of Xarelto is bleeding. You should call your healthcare provider right away if you experience any of the following: ? Bleeding from an injury or your nose that does not stop. ? Unusual colored urine (red or dark brown) or unusual colored stools (red or black). ? Unusual bruising for unknown reasons. ? A serious fall or if you hit your head (even if there is no bleeding).  Some medicines may interact with Xarelto and might increase your risk of bleeding while on Xarelto. To help avoid this, consult your healthcare  provider or pharmacist prior to using any new prescription or non-prescription medications, including herbals, vitamins, non-steroidal anti-inflammatory drugs (NSAIDs) and supplements.  This website has more information on Xarelto: [email or web address].[email or web address].

## 2017-11-16 NOTE — Discharge Summary (Addendum)
ELECTROPHYSIOLOGY PROCEDURE DISCHARGE SUMMARY    Patient ID: [name],  MRN: [id number], DOB/AGE: 66-22-1952 66 y.o.  Admit date: 11/16/2017 Discharge date: 11/17/17  Primary Care Physician: [name], MD  Primary Cardiologist: Dr. [name] Electrophysiologist: [name], MD  Primary Discharge Diagnosis:  1. Paroxysmal AFib     CHA2DS2Vasc is 4, on Xarelto  Secondary Discharge Diagnosis:  1. HTN 2. DM    Procedures This Admission:  1.  Electrophysiology study and radiofrequency catheter ablation on 11/15/17 by Dr [name].  This study demonstrated   CONCLUSIONS: 1. Sinus rhythm upon presentation.   2. 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  The left inferior PV was small, though I did not appreciate PV stenosis today 3. Return of electrical activity only within the left superior pulmonary vein.  The left inferior pulmonary vein, right superior and right inferior pulmonary veins were quiescent from a prior ablation procedure and did not require additional ablation today. 4. Successful electrical re-isolation of the left superior pulmonary vein   5. Cavo-tricuspid isthmus ablation was performed with isthmus block achieved.  6. Atrial fibrillation induced with high dose Isuprel and aggressive atrial pacing.  Atrial fibrillation successfully cardioverted 7. Additional ablation performed at the SVC/RA junction due to persistence of atrial fibrillation 8. No early apparent complications.   Brief HPI: [name] is a 67 y.o. female with a history of paroxysmal atrial fibrillation.  They have failed medical therapy with [name], prior ablation. Risks, benefits, and alternatives to catheter ablation of atrial fibrillation were reviewed with the patient who wished to proceed.  The patient underwent cardiac CT prior to the procedure which demonstrated no LAA thrombus.    Hospital Course:  The patient was  admitted and underwent EPS/RFCA of atrial fibrillation with details as outlined above.  She was monitored on telemetry overnight which demonstrated SR with PAFib 90's-110's.  R groin was without complication on the day of discharge.  The patient was examined by Dr. Johney Frame and considered to be stable for discharge.  Wound care and restrictions were reviewed with the patient.  The patient will be seen back by Rudi Coco, NP in 4 weeks and Dr Johney Frame in 12 weeks for post ablation follow up.   Patient has history of syncope, (deckined pacing) in review of record.  She quit driving 5 years ago.   Physical Exam: Vitals:   11/16/17 2126 11/17/17 0031 11/17/17 0349 11/17/17 0751  BP: 130/80 (!) 164/99 126/68 118/60  Pulse: (!) 57  (!) 58 63  Resp: 18 19 18    Temp: 98.9 F (37.2 C) 98.8 F (37.1 C) 98.1 F (36.7 C) 98.1 F (36.7 C)  TempSrc: Oral Oral Oral Oral  SpO2: 98% 99% 99% 97%  Weight:   218 lb 4.8 oz (99 kg)   Height:        GEN- The patient is well appearing, alert and oriented x 3 today.   HEENT: normocephalic, atraumatic; sclera clear, conjunctiva pink; hearing intact; oropharynx clear; neck supple  Lungs- CTA b/l, normal work of breathing.  No wheezes, rales, rhonchi Heart- iRRR, no murmurs, rubs or gallops  GI- soft, non-tender, non-distended Extremities- no clubbing, cyanosis, or edema; DP/PT  2+ bilaterally, R groin without hematoma/bruit MS- no significant deformity or atrophy Skin- warm and dry, no rash or lesion Psych- euthymic mood, full affect Neuro- strength and sensation are intact   Labs:   Lab Results  Component Value Date  WBC 3.9 11/05/2017   HGB 13.3 11/05/2017   HCT 39.8 11/05/2017   MCV 87 11/05/2017   PLT 198 11/05/2017    Recent Labs  Lab 11/17/17 0519  NA 134*  K 4.5  CL 102  CO2 21*  BUN 11  CREATININE 1.27*  CALCIUM 8.3*  GLUCOSE 210*     Discharge Medications:  Allergies as of 11/17/2017      Reactions   Prednisone Other (See  Comments)   SENT PATIENT INTO A-FIB    Amitriptyline Other (See Comments)   Made the patient disoriented   Hydromorphone Hcl Other (See Comments)   Made the blood pressure drop (HYPOtension)      Medication List    TAKE these medications   albuterol 108 (90 Base) MCG/ACT inhaler Commonly known as:  PROAIR HFA inhale 2 puffs every 6 hours if needed for wheezing or shortness of breath   atorvastatin 80 MG tablet Commonly known as:  LIPITOR Take 80 mg by mouth at bedtime.   calcium carbonate 600 MG Tabs tablet Commonly known as:  OS-CAL Take 600 mg by mouth daily.   cholecalciferol 1000 units tablet Commonly known as:  VITAMIN D Take 1,000 Units by mouth 2 (two) times daily.   Cranberry 500 MG Caps Take 500 mg by mouth 2 (two) times daily.   dofetilide 250 MCG capsule Commonly known as:  [name] take 1 capsule by mouth twice a day What changed:    how much to take  how to take this  when to take this   DULERA 100-5 MCG/ACT Aero Generic drug:  mometasone-formoterol inhale 2 puffs INTO THE LUNGS every 12 hours   DULoxetine 60 MG capsule Commonly known as:  CYMBALTA Take 60 mg by mouth at bedtime.   esomeprazole 40 MG capsule Commonly known as:  NEXIUM Take 1 capsule (40 mg total) by mouth daily at 12 noon. What changed:  when to take this   EVZIO 0.4 MG/0.4ML Soaj Generic drug:  Naloxone HCl Inject 0.4 mLs as directed daily as needed (for overdose).   fluticasone 50 MCG/ACT nasal spray Commonly known as:  FLONASE instill 2 sprays into each nostril once daily What changed:    how much to take  how to take this  when to take this  additional instructions   HUMALOG KWIKPEN 100 UNIT/ML KiwkPen Generic drug:  insulin lispro Inject 18-32 Units into the skin 3 (three) times daily with meals. PER Patient home SLIDING SCALE 18 units if CBG < 200 32 units  If CBG  > 200   Insulin Glargine 100 UNIT/ML Solostar Pen Commonly known as:  LANTUS  SOLOSTAR Inject 30 Units into the skin at bedtime. What changed:  how much to take   loperamide 2 MG tablet Commonly known as:  IMODIUM A-D Take 1 tablet (2 mg total) by mouth 4 (four) times daily as needed for diarrhea or loose stools.   loratadine 10 MG tablet Commonly known as:  CLARITIN Take 1 tablet (10 mg total) by mouth daily.   magnesium oxide 400 MG tablet Commonly known as:  MAG-OX Take 1 tablet (400 mg total) by mouth 2 (two) times daily.   metoprolol tartrate 50 MG tablet Commonly known as:  LOPRESSOR Take 1 tablet (50 mg total) by mouth 2 (two) times daily.   nitroGLYCERIN 0.4 MG SL tablet Commonly known as:  NITROSTAT Place 1 tablet (0.4 mg total) under the tongue every 5 (five) minutes as needed for chest pain (do nto  exceed 3 doses). What changed:    when to take this  reasons to take this   ondansetron 4 MG disintegrating tablet Commonly known as:  ZOFRAN ODT Take 1 tablet (4 mg total) by mouth every 8 (eight) hours as needed for nausea or vomiting. Notes to patient:  Clarify with original prescribing doctor appears a duplicate    ondansetron 4 MG tablet Commonly known as:  ZOFRAN Take 1 tablet (4 mg total) by mouth every 8 (eight) hours as needed for nausea or vomiting. Notes to patient:  Clarify with original prescribing doctor, appears a duplicate   PERCOCET 10-325 MG tablet Generic drug:  oxyCODONE-acetaminophen Take 1 tablet by mouth 5 (five) times daily. (scheduled)   potassium chloride 10 MEQ tablet Commonly known as:  K-DUR take 1 tablet by mouth once daily What changed:    how much to take  how to take this  when to take this   pregabalin 100 MG capsule Commonly known as:  LYRICA Take 100 mg by mouth 3 (three) times daily.   REFRESH OP Place 1 drop into both eyes 3 (three) times daily as needed (for dry eyes).   rivaroxaban 20 MG Tabs tablet Commonly known as:  XARELTO Take 20 mg by mouth once a day with supper What changed:     how much to take  how to take this  when to take this  additional instructions   tamsulosin 0.4 MG Caps capsule Commonly known as:  FLOMAX Take 0.4 mg by mouth at bedtime.   vitamin C 500 MG tablet Commonly known as:  ASCORBIC ACID Take 500 mg by mouth 2 (two) times daily.   VOLTAREN 1 % Gel Generic drug:  diclofenac sodium APPLY 2 INCHES TO AFFECTED AREA FOUR TIMES A DAY What changed:  See the new instructions.       Disposition:  Home  Discharge Instructions    Diet - low sodium heart healthy   Complete by:  As directed    Increase activity slowly   Complete by:  As directed      Follow-up Information    [facility] Follow up on 12/16/2017.   Specialty:  Cardiology Why:  11:00AM Contact information: [address] [id number] mc [address] [place] [address] [phone]       [name], MD Follow up on 02/21/2018.   Specialty:  Cardiology Why:  10:45AM Contact information: [address] [place] [address] [phone]           Duration of Discharge Encounter: Greater than 30 minutes including physician time.  Norma Fredrickson, PA-C 11/17/2017 9:15 AM

## 2017-11-16 NOTE — Progress Notes (Signed)
Patient complaining of lower back pain and pressure. Pain medication given at this time.

## 2017-11-16 NOTE — Progress Notes (Signed)
Notified [name] on call for cardiology that patient's BP is 142/107, patient is in afib with HR 110s, and patient c/o dizziness and "feeling bad." No new medication orders received at this time since patient just received her evening dose of tikosyn. Orders to maintain patient on bed rest and closely monitor patient; will notify MD on call if symptoms worsen.

## 2017-11-16 NOTE — Transfer of Care (Signed)
Immediate Anesthesia Transfer of Care Note  Patient: Toni SciaraLinda F Bert  Procedure(s) Performed: ATRIAL FIBRILLATION ABLATION (N/A )  Patient Location: PACU and Cath Lab  Anesthesia Type:General  Level of Consciousness: awake and patient cooperative  Airway & Oxygen Therapy: Patient Spontanous Breathing and Patient connected to face mask oxygen  Post-op Assessment: Report given to RN and Post -op Vital signs reviewed and stable  Post vital signs: Reviewed and stable  Last Vitals:  Vitals Value Taken Time  BP 126/56 11/16/2017 10:32 AM  Temp 36.1 C 11/16/2017 10:31 AM  Pulse 62 11/16/2017 10:34 AM  Resp 21 11/16/2017 10:34 AM  SpO2 99 % 11/16/2017 10:34 AM  Vitals shown include unvalidated device data.  Last Pain:  Vitals:   11/16/17 1031  TempSrc: Temporal  PainSc:          Complications: No apparent anesthesia complications

## 2017-11-16 NOTE — Anesthesia Procedure Notes (Signed)
Procedure Name: Intubation Date/Time: 11/16/2017 7:50 AM Performed by: Marny Lowensteinapozzi, Caniyah Murley W, CRNA Pre-anesthesia Checklist: Patient identified, Emergency Drugs available, Suction available, Patient being monitored and Timeout performed Patient Re-evaluated:Patient Re-evaluated prior to induction Oxygen Delivery Method: Circle system utilized Preoxygenation: Pre-oxygenation with 100% oxygen Induction Type: IV induction Ventilation: Mask ventilation without difficulty and Oral airway inserted - appropriate to patient size Laryngoscope Size: Hyacinth MeekerMiller and 2 Grade View: Grade I Tube type: Oral Tube size: 7.0 mm Number of attempts: 1 Airway Equipment and Method: Patient positioned with wedge pillow Placement Confirmation: ETT inserted through vocal cords under direct vision,  positive ETCO2,  CO2 detector and breath sounds checked- equal and bilateral Secured at: 22 cm Tube secured with: Tape Dental Injury: Teeth and Oropharynx as per pre-operative assessment

## 2017-11-16 NOTE — Progress Notes (Signed)
Pt c/o chest tightness and reports not feeling well; appears diaphoretic and is requesting a clean gown.  A-fib on telemetry since 2216.  2L/[place] applied and chest tightness relieved.  BP 152/104.  Dr. Clarnce FlockFudim notified.  No new orders received at this time.  Will continue to monitor.  Alonza Bogusuvall, Daryan Cagley Gray

## 2017-11-16 NOTE — Anesthesia Postprocedure Evaluation (Signed)
Anesthesia Post Note  Patient: Toni SciaraLinda F Hiraldo  Procedure(s) Performed: ATRIAL FIBRILLATION ABLATION (N/A )     Patient location during evaluation: PACU Anesthesia Type: General Level of consciousness: awake and alert Pain management: pain level controlled Vital Signs Assessment: post-procedure vital signs reviewed and stable Respiratory status: spontaneous breathing, nonlabored ventilation, respiratory function stable and patient connected to nasal cannula oxygen Cardiovascular status: blood pressure returned to baseline and stable Postop Assessment: no apparent nausea or vomiting Anesthetic complications: no    Last Vitals:  Vitals:   11/16/17 1335 11/16/17 1350  BP: 140/74 (!) 145/72  Pulse:    Resp:    Temp:    SpO2: 99% 99%    Last Pain:  Vitals:   11/16/17 1321  TempSrc: Oral  PainSc:                  Tationa Stech P Danica Camarena

## 2017-11-16 NOTE — Interval H&P Note (Signed)
History and Physical Interval Note:  11/16/2017 7:25 AM  [name]  has presented today for surgery, with the diagnosis of afib  The various methods of treatment have been discussed with the patient and family. After consideration of risks, benefits and other options for treatment, the patient has consented to  Procedure(s): ATRIAL FIBRILLATION ABLATION (N/A) as a surgical intervention .  The patient's history has been reviewed, patient examined, no change in status, stable for surgery.  I have reviewed the patient's chart and labs.  Questions were answered to the patient's satisfaction.     Hillis RangeJames Jerilee Space

## 2017-11-16 NOTE — Progress Notes (Signed)
Site area: rt groin fv sheaths x3 Site Prior to Removal:  Level 0 Pressure Applied For: 20 minutes Manual:   yes Patient Status During Pull:  stable Post Pull Site:  Level 0 Post Pull Instructions Given: yes  Post Pull Pulses Present: palpable rt dp Dressing Applied:  Gauze and tegaderm Bedrest begins @ 1200 Comments:  IV saline locked

## 2017-11-16 NOTE — Progress Notes (Addendum)
Noted that patient is back in atrial fibrillation per telemetry; [facility].

## 2017-11-17 DIAGNOSIS — I10 Essential (primary) hypertension: Secondary | ICD-10-CM | POA: Diagnosis not present

## 2017-11-17 DIAGNOSIS — I480 Paroxysmal atrial fibrillation: Secondary | ICD-10-CM

## 2017-11-17 DIAGNOSIS — I483 Typical atrial flutter: Secondary | ICD-10-CM | POA: Diagnosis not present

## 2017-11-17 DIAGNOSIS — J45909 Unspecified asthma, uncomplicated: Secondary | ICD-10-CM | POA: Diagnosis not present

## 2017-11-17 LAB — MAGNESIUM: Magnesium: 2 mg/dL (ref 1.7–2.4)

## 2017-11-17 LAB — BASIC METABOLIC PANEL
Anion gap: 11 (ref 5–15)
BUN: 11 mg/dL (ref 6–20)
CHLORIDE: 102 mmol/L (ref 101–111)
CO2: 21 mmol/L — AB (ref 22–32)
Calcium: 8.3 mg/dL — ABNORMAL LOW (ref 8.9–10.3)
Creatinine, Ser: 1.27 mg/dL — ABNORMAL HIGH (ref 0.44–1.00)
GFR calc non Af Amer: 43 mL/min — ABNORMAL LOW (ref >60)
GFR, EST AFRICAN AMERICAN: 50 mL/min — AB (ref >60)
GLUCOSE: 210 mg/dL — AB (ref 65–99)
POTASSIUM: 4.5 mmol/L (ref 3.5–5.1)
Sodium: 134 mmol/L — ABNORMAL LOW (ref 135–145)

## 2017-11-17 LAB — GLUCOSE, CAPILLARY
GLUCOSE-CAPILLARY: 169 mg/dL — AB (ref 65–99)
GLUCOSE-CAPILLARY: 230 mg/dL — AB (ref 65–99)
Glucose-Capillary: 252 mg/dL — ABNORMAL HIGH (ref 65–99)
Glucose-Capillary: 187 mg/dL — ABNORMAL HIGH (ref 65–99)

## 2017-11-17 NOTE — Progress Notes (Addendum)
Called by RN patient reported not feeling well, staring off to space, as I enter the room, the patient is alert talking on her cell phone and appears in no distress.  She mentions some CP since yesterday, sounds pleuritic by her description and not uncommon after AF ablation, tells me she feels lightheaded, weak  her telemetry is AFib 115, SBP 150, not new    No focal deficits are appreciated, follows all commands Patient is oriented x3 Skin is warm and dry, she is not dyspneic No rubs, lungs are clear, procedure site is stable  Discussed w/Dr. Johney FrameAllred, will monitor further this morning, anticipate we will be able to discharge later this morning Discussed with RN, notify if any change  Francis Dowseenee Ursuy, PA-C   Revisited patient She is eating lunch, again in NAD.  She tells me she is uncomfortable leaving today, worried about her AFib, fainting. PAF here 110's, faster with ambulation 130's, and SR 60's. Currently SR 60's-70's, 144/78 No CP or SOB, she is aware of he AFib and makes her worried about fainting, she does not feel like she can go home today  D/W Dr. Johney FrameAllred, will keep her tonight, planned for discharge in AM The patient feels better with this plan and feels by tomorrow she will be ready.  Francis Dowseenee Ursuy, PA-C Hillis RangeJames Celise Bazar MD, [facility] 11/17/2017 8:52 PM

## 2017-11-17 NOTE — Plan of Care (Signed)
  Problem: Education: Goal: Knowledge of disease or condition will improve Outcome: Progressing Goal: Understanding of medication regimen will improve Outcome: Progressing   Problem: Education: Goal: Knowledge of General Education information will improve Outcome: Progressing

## 2017-11-17 NOTE — Progress Notes (Signed)
   Progress Note   Subjective   Doing well today, the patient denies CP or SOB.  No new concerns but does not feel that it is "safe" for her to go home today.  Inpatient Medications    Scheduled Meds: . dofetilide  250 mcg Oral BID  . DULoxetine  60 mg Oral QHS  . insulin aspart  0-5 Units Subcutaneous QHS  . insulin aspart  0-9 Units Subcutaneous TID WC  . insulin glargine  30 Units Subcutaneous QHS  . magnesium oxide  400 mg Oral BID  . potassium chloride  10 mEq Oral Daily  . pregabalin  100 mg Oral TID  . rivaroxaban  20 mg Oral Q supper  . sodium chloride flush  3 mL Intravenous Q12H  . tamsulosin  0.4 mg Oral QHS   Continuous Infusions: . sodium chloride     PRN Meds: sodium chloride, acetaminophen, ondansetron (ZOFRAN) IV, oxyCODONE-acetaminophen **AND** oxyCODONE, sodium chloride flush   Vital Signs    Vitals:   11/17/17 0751 11/17/17 1154 11/17/17 1625 11/17/17 1926  BP: 118/60 (!) 160/99 131/62 (!) 139/94  Pulse: 63 (!) 102 68 (!) 127  Resp:    (!) 22  Temp: 98.1 F (36.7 C) 98.7 F (37.1 C) 98.6 F (37 C) 98 F (36.7 C)  TempSrc: Oral Oral Oral Oral  SpO2: 97% 99% 98% 98%  Weight:      Height:        Intake/Output Summary (Last 24 hours) at 11/17/2017 2052 Last data filed at 11/17/2017 2016 Gross per 24 hour  Intake 1800 ml  Output 1950 ml  Net -150 ml   Filed Weights   11/16/17 0541 11/16/17 1317 11/17/17 0349  Weight: 90.7 kg (200 lb) 99.3 kg (218 lb 14.7 oz) 99 kg (218 lb 4.8 oz)    Telemetry    Sinus rhythm, short ERAF notes - Personally Reviewed  Physical Exam   GEN- The patient is well appearing, alert and oriented x 3 today.   Head- normocephalic, atraumatic Eyes-  Sclera clear, conjunctiva pink Ears- hearing intact Oropharynx- clear Neck- supple, Lungs- Clear to ausculation bilaterally, normal work of breathing Heart- Regular rate and rhythm  GI- soft, NT, ND, + BS Extremities- no clubbing, cyanosis, or edema  MS- no  significant deformity or atrophy Skin- no rash or lesion Psych- euthymic mood, full affect Neuro- strength and sensation are intact   Labs    Chemistry Recent Labs  Lab 11/17/17 0519  NA 134*  K 4.5  CL 102  CO2 21*  GLUCOSE 210*  BUN 11  CREATININE 1.27*  CALCIUM 8.3*  GFRNONAA 43*  GFRAA 50*  ANIONGAP 11     HematologyNo results for input(s): WBC, RBC, HGB, HCT, MCV, MCH, MCHC, RDW, PLT in the last 168 hours.  Cardiac EnzymesNo results for input(s): TROPONINI in the last 168 hours. No results for input(s): TROPIPOC in the last 168 hours.      Assessment & Plan    1.  paroxysmal atrial fibrillation Doing very well s/p ablation Objectively appears fine.  She is reluctant to go home today.  Per her preference, will keep in hospital until tomorrow. Continue home medicines and monitor.  Hillis RangeJames Johnpatrick Jenny MD, [facility] 11/17/2017 8:52 PM

## 2017-11-18 DIAGNOSIS — I10 Essential (primary) hypertension: Secondary | ICD-10-CM | POA: Diagnosis not present

## 2017-11-18 DIAGNOSIS — I483 Typical atrial flutter: Secondary | ICD-10-CM | POA: Diagnosis not present

## 2017-11-18 DIAGNOSIS — I480 Paroxysmal atrial fibrillation: Secondary | ICD-10-CM | POA: Diagnosis not present

## 2017-11-18 DIAGNOSIS — J45909 Unspecified asthma, uncomplicated: Secondary | ICD-10-CM | POA: Diagnosis not present

## 2017-11-18 LAB — GLUCOSE, CAPILLARY
Glucose-Capillary: 204 mg/dL — ABNORMAL HIGH (ref 65–99)
Glucose-Capillary: 212 mg/dL — ABNORMAL HIGH (ref 65–99)

## 2017-11-18 NOTE — Discharge Summary (Addendum)
ELECTROPHYSIOLOGY PROCEDURE DISCHARGE SUMMARY    Patient ID: Toni RosenthalLinda F Guier,  MRN: [id number], DOB/AGE: 10-08-1950 67 y.o.  Admit date: 11/16/2017 Discharge date: 11/18/17  Primary Care Physician: Berton BonMikell, [name], MD  Primary Cardiologist: Dr. Eden EmmsNishan Electrophysiologist: Hillis RangeJames Saaya Procell, MD  Primary Discharge Diagnosis:  1. Paroxysmal AFib     CHA2DS2Vasc is 4, on Xarelto  Secondary Discharge Diagnosis:  1. HTN 2. DM    Procedures This Admission:  1.  Electrophysiology study and radiofrequency catheter ablation on 11/15/17 by Dr Hillis RangeJames Braeden Dolinski.  This study demonstrated   CONCLUSIONS: 1. Sinus rhythm upon presentation.   2. 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  The left inferior PV was small, though I did not appreciate PV stenosis today 3. Return of electrical activity only within the left superior pulmonary vein.  The left inferior pulmonary vein, right superior and right inferior pulmonary veins were quiescent from a prior ablation procedure and did not require additional ablation today. 4. Successful electrical re-isolation of the left superior pulmonary vein   5. Cavo-tricuspid isthmus ablation was performed with isthmus block achieved.  6. Atrial fibrillation induced with high dose Isuprel and aggressive atrial pacing.  Atrial fibrillation successfully cardioverted 7. Additional ablation performed at the SVC/RA junction due to persistence of atrial fibrillation 8. No early apparent complications.   Brief HPI: Toni RosenthalLinda F Donaldson is a 67 y.o. female with a history of paroxysmal atrial fibrillation.  They have failed medical therapy with [name], prior ablation. Risks, benefits, and alternatives to catheter ablation of atrial fibrillation were reviewed with the patient who wished to proceed.  The patient underwent cardiac CT prior to the procedure which demonstrated no LAA thrombus.    Hospital Course:  The patient was  admitted and underwent EPS/RFCA of atrial fibrillation with details as outlined above.  She was monitored on telemetry through her stay which demonstrated SR with PAFib 90's-110's.  R groin is without complication on the day of discharge.  The patient was examined by Dr. Johney FrameAllred and considered to be stable for discharge.  Wound care and restrictions were re-reviewed with the patient.  The patient will be seen back by Rudi Cocoonna Carroll, NP in 2 weeks for close follow up and Dr Vicie Cech in 12 weeks for post ablation follow up.   Patient has history of syncope, (declined pacing) in review of record.  She quit driving 5 years ago.  Initially planned for discharge yesterday, though she was stable, the patient felt she was not ready to go.  She feels well this morning, no CP, palpitations or SOB, and ready to go home.     Physical Exam: Vitals:   11/18/17 0530 11/18/17 0554 11/18/17 0757 11/18/17 0825  BP:  (!) 149/101    Pulse:  65 61   Resp:  20 15   Temp:  98 F (36.7 C)  97.7 F (36.5 C)  TempSrc:  Axillary  Oral  SpO2:  97% 98%   Weight: 219 lb 9.6 oz (99.6 kg)     Height:        GEN- The patient is well appearing, alert and oriented x 3 today.   HEENT: normocephalic, atraumatic; sclera clear, conjunctiva pink; hearing intact; oropharynx clear; neck supple  Lungs- CTA b/l, normal work of breathing.  No wheezes, rales, rhonchi Heart- RRR, no murmurs, rubs or gallops  GI- soft, non-tender, non-distended Extremities- no clubbing, cyanosis, or edema; DP/PT  2+ bilaterally, R groin remains stable without  hematoma/bruit MS- no significant deformity or atrophy Skin- warm and dry, no rash or lesion Psych- euthymic mood, full affect Neuro- strength and sensation are intact   Labs:   Lab Results  Component Value Date   WBC 3.9 11/05/2017   HGB 13.3 11/05/2017   HCT 39.8 11/05/2017   MCV 87 11/05/2017   PLT 198 11/05/2017    Recent Labs  Lab 11/17/17 0519  NA 134*  K 4.5  CL 102  CO2  21*  BUN 11  CREATININE 1.27*  CALCIUM 8.3*  GLUCOSE 210*     Discharge Medications:  Allergies as of 11/18/2017      Reactions   Prednisone Other (See Comments)   SENT PATIENT INTO A-FIB    Amitriptyline Other (See Comments)   Made the patient disoriented   Hydromorphone Hcl Other (See Comments)   Made the blood pressure drop (HYPOtension)      Medication List    TAKE these medications   albuterol 108 (90 Base) MCG/ACT inhaler Commonly known as:  PROAIR HFA inhale 2 puffs every 6 hours if needed for wheezing or shortness of breath   atorvastatin 80 MG tablet Commonly known as:  LIPITOR Take 80 mg by mouth at bedtime.   calcium carbonate 600 MG Tabs tablet Commonly known as:  OS-CAL Take 600 mg by mouth daily.   cholecalciferol 1000 units tablet Commonly known as:  VITAMIN D Take 1,000 Units by mouth 2 (two) times daily.   Cranberry 500 MG Caps Take 500 mg by mouth 2 (two) times daily.   dofetilide 250 MCG capsule Commonly known as:  [name] take 1 capsule by mouth twice a day What changed:    how much to take  how to take this  when to take this   DULERA 100-5 MCG/ACT Aero Generic drug:  mometasone-formoterol inhale 2 puffs INTO THE LUNGS every 12 hours   DULoxetine 60 MG capsule Commonly known as:  CYMBALTA Take 60 mg by mouth at bedtime.   esomeprazole 40 MG capsule Commonly known as:  NEXIUM Take 1 capsule (40 mg total) by mouth daily at 12 noon. What changed:  when to take this   EVZIO 0.4 MG/0.4ML Soaj Generic drug:  Naloxone HCl Inject 0.4 mLs as directed daily as needed (for overdose).   fluticasone 50 MCG/ACT nasal spray Commonly known as:  FLONASE instill 2 sprays into each nostril once daily What changed:    how much to take  how to take this  when to take this  additional instructions   HUMALOG KWIKPEN 100 UNIT/ML KiwkPen Generic drug:  insulin lispro Inject 18-32 Units into the skin 3 (three) times daily with meals. PER  Patient home SLIDING SCALE 18 units if CBG < 200 32 units  If CBG  > 200   Insulin Glargine 100 UNIT/ML Solostar Pen Commonly known as:  LANTUS SOLOSTAR Inject 30 Units into the skin at bedtime. What changed:  how much to take   loperamide 2 MG tablet Commonly known as:  IMODIUM A-D Take 1 tablet (2 mg total) by mouth 4 (four) times daily as needed for diarrhea or loose stools.   loratadine 10 MG tablet Commonly known as:  CLARITIN Take 1 tablet (10 mg total) by mouth daily.   magnesium oxide 400 MG tablet Commonly known as:  MAG-OX Take 1 tablet (400 mg total) by mouth 2 (two) times daily.   metoprolol tartrate 50 MG tablet Commonly known as:  LOPRESSOR Take 1 tablet (50 mg  total) by mouth 2 (two) times daily.   nitroGLYCERIN 0.4 MG SL tablet Commonly known as:  NITROSTAT Place 1 tablet (0.4 mg total) under the tongue every 5 (five) minutes as needed for chest pain (do nto exceed 3 doses). What changed:    when to take this  reasons to take this   ondansetron 4 MG disintegrating tablet Commonly known as:  ZOFRAN ODT Take 1 tablet (4 mg total) by mouth every 8 (eight) hours as needed for nausea or vomiting. Notes to patient:  Clarify with original prescribing doctor appears a duplicate    ondansetron 4 MG tablet Commonly known as:  ZOFRAN Take 1 tablet (4 mg total) by mouth every 8 (eight) hours as needed for nausea or vomiting. Notes to patient:  Clarify with original prescribing doctor, appears a duplicate   PERCOCET 10-325 MG tablet Generic drug:  oxyCODONE-acetaminophen Take 1 tablet by mouth 5 (five) times daily. (scheduled)   potassium chloride 10 MEQ tablet Commonly known as:  K-DUR take 1 tablet by mouth once daily What changed:    how much to take  how to take this  when to take this   pregabalin 100 MG capsule Commonly known as:  LYRICA Take 100 mg by mouth 3 (three) times daily.   REFRESH OP Place 1 drop into both eyes 3 (three) times daily  as needed (for dry eyes).   rivaroxaban 20 MG Tabs tablet Commonly known as:  XARELTO Take 20 mg by mouth once a day with supper What changed:    how much to take  how to take this  when to take this  additional instructions   tamsulosin 0.4 MG Caps capsule Commonly known as:  FLOMAX Take 0.4 mg by mouth at bedtime.   vitamin C 500 MG tablet Commonly known as:  ASCORBIC ACID Take 500 mg by mouth 2 (two) times daily.   VOLTAREN 1 % Gel Generic drug:  diclofenac sodium APPLY 2 INCHES TO AFFECTED AREA FOUR TIMES A DAY What changed:  See the new instructions.       Disposition:  Home  Discharge Instructions    Diet - low sodium heart healthy   Complete by:  As directed    Diet - low sodium heart healthy   Complete by:  As directed    Increase activity slowly   Complete by:  As directed    Increase activity slowly   Complete by:  As directed      Follow-up Information    [facility] Follow up on 12/03/2017.   Specialty:  Cardiology Why:  10:30AM Contact information: [address] [id number] mc [address] [place] [address] [phone]       [name], MD Follow up on 02/21/2018.   Specialty:  Cardiology Why:  10:45AM Contact information: [address] [place] [address] [phone]           Duration of Discharge Encounter: Greater than 30 minutes including physician time.  Signed, Francis Dowse, PA-C 11/18/2017 10:01 AM   I have seen, examined the patient, and reviewed the above assessment and plan.  Changes to above are made where necessary.  On exam, iRRR.  Doing well at this time, but with ERAF.  DC to home.  Co Sign: [name], MD 11/18/2017

## 2017-11-18 NOTE — Progress Notes (Signed)
Doing well today. Some ERAF with reasonably controlled V rates. She has also had sinus bradycardia which further limits medical therapy. She is clear that she does not want PPM at this time.  Discharge with close outpatient follow-up.  Continue current medicines.  Hillis RangeJames Jerimiah Wolman MD, [facility] 11/18/2017 7:31 AM

## 2017-11-22 ENCOUNTER — Inpatient Hospital Stay (HOSPITAL_COMMUNITY)
Admission: EM | Admit: 2017-11-22 | Discharge: 2017-11-25 | DRG: 310 | Disposition: A | Discharge status: Home / Self Care | Payer: Medicare Other | Attending: Family Medicine | Admitting: Family Medicine

## 2017-11-22 ENCOUNTER — Encounter (HOSPITAL_COMMUNITY): Payer: Self-pay

## 2017-11-22 ENCOUNTER — Emergency Department (HOSPITAL_COMMUNITY): Payer: Medicare Other

## 2017-11-22 ENCOUNTER — Telehealth (HOSPITAL_COMMUNITY): Payer: Self-pay | Admitting: *Deleted

## 2017-11-22 ENCOUNTER — Other Ambulatory Visit: Payer: Self-pay

## 2017-11-22 DIAGNOSIS — Z888 Allergy status to other drugs, medicaments and biological substances status: Secondary | ICD-10-CM | POA: Diagnosis not present

## 2017-11-22 DIAGNOSIS — G8929 Other chronic pain: Secondary | ICD-10-CM | POA: Diagnosis present

## 2017-11-22 DIAGNOSIS — D860 Sarcoidosis of lung: Secondary | ICD-10-CM | POA: Diagnosis present

## 2017-11-22 DIAGNOSIS — R0782 Intercostal pain: Secondary | ICD-10-CM | POA: Diagnosis not present

## 2017-11-22 DIAGNOSIS — J45909 Unspecified asthma, uncomplicated: Secondary | ICD-10-CM | POA: Diagnosis present

## 2017-11-22 DIAGNOSIS — K219 Gastro-esophageal reflux disease without esophagitis: Secondary | ICD-10-CM | POA: Diagnosis present

## 2017-11-22 DIAGNOSIS — I129 Hypertensive chronic kidney disease with stage 1 through stage 4 chronic kidney disease, or unspecified chronic kidney disease: Secondary | ICD-10-CM | POA: Diagnosis present

## 2017-11-22 DIAGNOSIS — Z7901 Long term (current) use of anticoagulants: Secondary | ICD-10-CM | POA: Diagnosis not present

## 2017-11-22 DIAGNOSIS — Z79899 Other long term (current) drug therapy: Secondary | ICD-10-CM | POA: Diagnosis not present

## 2017-11-22 DIAGNOSIS — N183 Chronic kidney disease, stage 3 unspecified: Secondary | ICD-10-CM

## 2017-11-22 DIAGNOSIS — Z90710 Acquired absence of both cervix and uterus: Secondary | ICD-10-CM

## 2017-11-22 DIAGNOSIS — Z8249 Family history of ischemic heart disease and other diseases of the circulatory system: Secondary | ICD-10-CM | POA: Diagnosis not present

## 2017-11-22 DIAGNOSIS — G40909 Epilepsy, unspecified, not intractable, without status epilepticus: Secondary | ICD-10-CM | POA: Diagnosis present

## 2017-11-22 DIAGNOSIS — F419 Anxiety disorder, unspecified: Secondary | ICD-10-CM

## 2017-11-22 DIAGNOSIS — E119 Type 2 diabetes mellitus without complications: Secondary | ICD-10-CM | POA: Insufficient documentation

## 2017-11-22 DIAGNOSIS — R55 Syncope and collapse: Secondary | ICD-10-CM | POA: Diagnosis not present

## 2017-11-22 DIAGNOSIS — R569 Unspecified convulsions: Secondary | ICD-10-CM | POA: Diagnosis not present

## 2017-11-22 DIAGNOSIS — R001 Bradycardia, unspecified: Secondary | ICD-10-CM | POA: Diagnosis present

## 2017-11-22 DIAGNOSIS — I480 Paroxysmal atrial fibrillation: Secondary | ICD-10-CM | POA: Diagnosis not present

## 2017-11-22 DIAGNOSIS — M797 Fibromyalgia: Secondary | ICD-10-CM | POA: Diagnosis present

## 2017-11-22 DIAGNOSIS — R079 Chest pain, unspecified: Secondary | ICD-10-CM

## 2017-11-22 DIAGNOSIS — E1165 Type 2 diabetes mellitus with hyperglycemia: Secondary | ICD-10-CM

## 2017-11-22 DIAGNOSIS — E118 Type 2 diabetes mellitus with unspecified complications: Secondary | ICD-10-CM

## 2017-11-22 DIAGNOSIS — Z885 Allergy status to narcotic agent status: Secondary | ICD-10-CM

## 2017-11-22 DIAGNOSIS — I4891 Unspecified atrial fibrillation: Secondary | ICD-10-CM

## 2017-11-22 DIAGNOSIS — R159 Full incontinence of feces: Secondary | ICD-10-CM | POA: Diagnosis present

## 2017-11-22 DIAGNOSIS — Z7951 Long term (current) use of inhaled steroids: Secondary | ICD-10-CM

## 2017-11-22 DIAGNOSIS — Z794 Long term (current) use of insulin: Secondary | ICD-10-CM

## 2017-11-22 DIAGNOSIS — I10 Essential (primary) hypertension: Secondary | ICD-10-CM | POA: Diagnosis not present

## 2017-11-22 DIAGNOSIS — E1122 Type 2 diabetes mellitus with diabetic chronic kidney disease: Secondary | ICD-10-CM | POA: Diagnosis present

## 2017-11-22 DIAGNOSIS — IMO0002 Reserved for concepts with insufficient information to code with codable children: Secondary | ICD-10-CM | POA: Diagnosis present

## 2017-11-22 DIAGNOSIS — M549 Dorsalgia, unspecified: Secondary | ICD-10-CM

## 2017-11-22 DIAGNOSIS — I495 Sick sinus syndrome: Secondary | ICD-10-CM | POA: Diagnosis not present

## 2017-11-22 DIAGNOSIS — F319 Bipolar disorder, unspecified: Secondary | ICD-10-CM

## 2017-11-22 DIAGNOSIS — F329 Major depressive disorder, single episode, unspecified: Secondary | ICD-10-CM | POA: Insufficient documentation

## 2017-11-22 DIAGNOSIS — F32A Depression, unspecified: Secondary | ICD-10-CM | POA: Insufficient documentation

## 2017-11-22 HISTORY — DX: Syncope and collapse: R55

## 2017-11-22 LAB — BASIC METABOLIC PANEL
Anion gap: 12 (ref 5–15)
BUN: 15 mg/dL (ref 6–20)
CHLORIDE: 104 mmol/L (ref 101–111)
CO2: 21 mmol/L — AB (ref 22–32)
CREATININE: 1.32 mg/dL — AB (ref 0.44–1.00)
Calcium: 9.2 mg/dL (ref 8.9–10.3)
GFR calc Af Amer: 48 mL/min — ABNORMAL LOW (ref >60)
GFR calc non Af Amer: 41 mL/min — ABNORMAL LOW (ref >60)
Glucose, Bld: 337 mg/dL — ABNORMAL HIGH (ref 65–99)
POTASSIUM: 4.8 mmol/L (ref 3.5–5.1)
Sodium: 137 mmol/L (ref 135–145)

## 2017-11-22 LAB — URINALYSIS, ROUTINE W REFLEX MICROSCOPIC
BACTERIA UA: NONE SEEN
BILIRUBIN URINE: NEGATIVE
Glucose, UA: >=500 mg/dL — AB
Hgb urine dipstick: NEGATIVE
KETONES UR: NEGATIVE mg/dL
LEUKOCYTES UA: NEGATIVE
Nitrite: NEGATIVE
PROTEIN: NEGATIVE mg/dL
RBC / HPF: NONE SEEN RBC/hpf (ref 0–5)
Specific Gravity, Urine: 1.02 (ref 1.005–1.030)
pH: 5 (ref 5.0–8.0)

## 2017-11-22 LAB — RAPID URINE DRUG SCREEN, HOSP PERFORMED
Amphetamines: NOT DETECTED
BARBITURATES: NOT DETECTED
BENZODIAZEPINES: NOT DETECTED
COCAINE: NOT DETECTED
Opiates: POSITIVE — AB
Tetrahydrocannabinol: NOT DETECTED

## 2017-11-22 LAB — I-STAT TROPONIN, ED: TROPONIN I, POC: 0.02 ng/mL (ref 0.00–0.08)

## 2017-11-22 LAB — HEPATIC FUNCTION PANEL
ALT: 18 U/L (ref 14–54)
AST: 25 U/L (ref 15–41)
Albumin: 3.6 g/dL (ref 3.5–5.0)
Alkaline Phosphatase: 100 U/L (ref 38–126)
BILIRUBIN DIRECT: 0.2 mg/dL (ref 0.1–0.5)
BILIRUBIN INDIRECT: 0.4 mg/dL (ref 0.3–0.9)
BILIRUBIN TOTAL: 0.6 mg/dL (ref 0.3–1.2)
Total Protein: 7 g/dL (ref 6.5–8.1)

## 2017-11-22 LAB — CBG MONITORING, ED: Glucose-Capillary: 206 mg/dL — ABNORMAL HIGH (ref 65–99)

## 2017-11-22 LAB — CBC
HEMATOCRIT: 37.5 % (ref 36.0–46.0)
HEMOGLOBIN: 11.9 g/dL — AB (ref 12.0–15.0)
MCH: 28.7 pg (ref 26.0–34.0)
MCHC: 31.7 g/dL (ref 30.0–36.0)
MCV: 90.6 fL (ref 78.0–100.0)
PLATELETS: 168 10*3/uL (ref 150–400)
RBC: 4.14 MIL/uL (ref 3.87–5.11)
RDW: 14.2 % (ref 11.5–15.5)
WBC: 4 10*3/uL (ref 4.0–10.5)

## 2017-11-22 LAB — ETHANOL

## 2017-11-22 LAB — GLUCOSE, CAPILLARY: Glucose-Capillary: 257 mg/dL — ABNORMAL HIGH (ref 65–99)

## 2017-11-22 MED ORDER — INSULIN ASPART 100 UNIT/ML ~~LOC~~ SOLN
0.0000 [IU] | Freq: Three times a day (TID) | SUBCUTANEOUS | Status: DC
  Administered 2017-11-23: 3 [IU] via SUBCUTANEOUS
  Administered 2017-11-23: 2 [IU] via SUBCUTANEOUS
  Administered 2017-11-23: 3 [IU] via SUBCUTANEOUS
  Administered 2017-11-24: 8 [IU] via SUBCUTANEOUS
  Administered 2017-11-24 (×2): 3 [IU] via SUBCUTANEOUS
  Administered 2017-11-25 (×2): 5 [IU] via SUBCUTANEOUS

## 2017-11-22 MED ORDER — INSULIN GLARGINE 100 UNIT/ML ~~LOC~~ SOLN
35.0000 [IU] | Freq: Every day | SUBCUTANEOUS | Status: DC
  Administered 2017-11-22 – 2017-11-23 (×2): 35 [IU] via SUBCUTANEOUS
  Filled 2017-11-22 (×2): qty 0.35

## 2017-11-22 MED ORDER — TAMSULOSIN HCL 0.4 MG PO CAPS
0.4000 mg | ORAL_CAPSULE | Freq: Every day | ORAL | Status: DC
  Administered 2017-11-22 – 2017-11-24 (×3): 0.4 mg via ORAL
  Filled 2017-11-22 (×3): qty 1

## 2017-11-22 MED ORDER — CALCIUM CARBONATE 1500 (600 CA) MG PO TABS
600.0000 mg | ORAL_TABLET | Freq: Every day | ORAL | Status: DC
  Filled 2017-11-22: qty 1

## 2017-11-22 MED ORDER — ACETAMINOPHEN 325 MG PO TABS: 650.0000 mg | ORAL_TABLET | Freq: Four times a day (QID) | ORAL | Status: DC | PRN

## 2017-11-22 MED ORDER — DICLOFENAC SODIUM 1 % TD GEL
2.0000 g | Freq: Four times a day (QID) | TRANSDERMAL | Status: DC
  Administered 2017-11-22 – 2017-11-23 (×2): 2 g via TOPICAL
  Filled 2017-11-22: qty 100

## 2017-11-22 MED ORDER — SODIUM CHLORIDE 0.9% FLUSH: 3.0000 mL | INTRAVENOUS | Status: DC | PRN

## 2017-11-22 MED ORDER — ATORVASTATIN CALCIUM 80 MG PO TABS
80.0000 mg | ORAL_TABLET | Freq: Every day | ORAL | Status: DC
  Administered 2017-11-23 – 2017-11-24 (×2): 80 mg via ORAL
  Filled 2017-11-22 (×3): qty 1

## 2017-11-22 MED ORDER — OXYCODONE-ACETAMINOPHEN 5-325 MG PO TABS
1.0000 | ORAL_TABLET | ORAL | Status: DC | PRN
Start: 2017-11-22 — End: 2017-11-25
  Administered 2017-11-22 – 2017-11-25 (×9): 1 via ORAL
  Filled 2017-11-22 (×9): qty 1

## 2017-11-22 MED ORDER — ONDANSETRON HCL 4 MG/2ML IJ SOLN
4.0000 mg | Freq: Four times a day (QID) | INTRAMUSCULAR | Status: DC | PRN
  Administered 2017-11-23: 4 mg via INTRAVENOUS
  Filled 2017-11-22: qty 2

## 2017-11-22 MED ORDER — DULOXETINE HCL 60 MG PO CPEP
60.0000 mg | ORAL_CAPSULE | Freq: Every day | ORAL | Status: DC
  Administered 2017-11-22 – 2017-11-24 (×3): 60 mg via ORAL
  Filled 2017-11-22 (×3): qty 1

## 2017-11-22 MED ORDER — MAGNESIUM OXIDE 400 (241.3 MG) MG PO TABS
400.0000 mg | ORAL_TABLET | Freq: Two times a day (BID) | ORAL | Status: DC
  Administered 2017-11-22 – 2017-11-25 (×6): 400 mg via ORAL
  Filled 2017-11-22 (×6): qty 1

## 2017-11-22 MED ORDER — HYDRALAZINE HCL 20 MG/ML IJ SOLN: 10.0000 mg | INTRAMUSCULAR | Status: DC | PRN

## 2017-11-22 MED ORDER — ALBUTEROL SULFATE (2.5 MG/3ML) 0.083% IN NEBU: 2.5000 mg | INHALATION_SOLUTION | Freq: Four times a day (QID) | RESPIRATORY_TRACT | Status: DC | PRN

## 2017-11-22 MED ORDER — INSULIN ASPART 100 UNIT/ML ~~LOC~~ SOLN
4.0000 [IU] | Freq: Three times a day (TID) | SUBCUTANEOUS | Status: DC
  Administered 2017-11-23 – 2017-11-25 (×8): 4 [IU] via SUBCUTANEOUS

## 2017-11-22 MED ORDER — INSULIN ASPART 100 UNIT/ML ~~LOC~~ SOLN: 5.0000 [IU] | Freq: Once | SUBCUTANEOUS | Status: DC

## 2017-11-22 MED ORDER — OXYCODONE HCL 5 MG PO TABS
5.0000 mg | ORAL_TABLET | ORAL | Status: DC | PRN
Start: 2017-11-22 — End: 2017-11-25
  Administered 2017-11-22 – 2017-11-25 (×9): 5 mg via ORAL
  Filled 2017-11-22 (×9): qty 1

## 2017-11-22 MED ORDER — RIVAROXABAN 20 MG PO TABS
20.0000 mg | ORAL_TABLET | Freq: Every day | ORAL | Status: DC
  Administered 2017-11-22 – 2017-11-25 (×4): 20 mg via ORAL
  Filled 2017-11-22 (×4): qty 1

## 2017-11-22 MED ORDER — SODIUM CHLORIDE 0.9% FLUSH
3.0000 mL | Freq: Two times a day (BID) | INTRAVENOUS | Status: DC
  Administered 2017-11-23 – 2017-11-24 (×2): 3 mL via INTRAVENOUS

## 2017-11-22 MED ORDER — OXYCODONE-ACETAMINOPHEN 10-325 MG PO TABS: 1.0000 | ORAL_TABLET | ORAL | Status: DC | PRN

## 2017-11-22 MED ORDER — INSULIN ASPART 100 UNIT/ML ~~LOC~~ SOLN
0.0000 [IU] | Freq: Every day | SUBCUTANEOUS | Status: DC
  Administered 2017-11-22: 3 [IU] via SUBCUTANEOUS

## 2017-11-22 MED ORDER — VITAMIN C 500 MG PO TABS
500.0000 mg | ORAL_TABLET | Freq: Two times a day (BID) | ORAL | Status: DC
  Administered 2017-11-22 – 2017-11-25 (×6): 500 mg via ORAL
  Filled 2017-11-22 (×6): qty 1

## 2017-11-22 MED ORDER — ACETAMINOPHEN 650 MG RE SUPP: 650.0000 mg | Freq: Four times a day (QID) | RECTAL | Status: DC | PRN

## 2017-11-22 MED ORDER — PREGABALIN 100 MG PO CAPS
100.0000 mg | ORAL_CAPSULE | Freq: Three times a day (TID) | ORAL | Status: DC
  Administered 2017-11-22 – 2017-11-25 (×9): 100 mg via ORAL
  Filled 2017-11-22 (×9): qty 1

## 2017-11-22 MED ORDER — ONDANSETRON HCL 4 MG PO TABS: 4.0000 mg | ORAL_TABLET | Freq: Four times a day (QID) | ORAL | Status: DC | PRN

## 2017-11-22 MED ORDER — BISACODYL 5 MG PO TBEC
5.0000 mg | DELAYED_RELEASE_TABLET | Freq: Every day | ORAL | Status: DC | PRN
  Administered 2017-11-24: 5 mg via ORAL
  Filled 2017-11-22: qty 1

## 2017-11-22 MED ORDER — SODIUM CHLORIDE 0.9 % IV SOLN: 250.0000 mL | INTRAVENOUS | Status: DC | PRN

## 2017-11-22 MED ORDER — VITAMIN D 1000 UNITS PO TABS
1000.0000 [IU] | ORAL_TABLET | Freq: Two times a day (BID) | ORAL | Status: DC
  Administered 2017-11-22 – 2017-11-25 (×6): 1000 [IU] via ORAL
  Filled 2017-11-22 (×6): qty 1

## 2017-11-22 MED ORDER — CALCIUM CARBONATE 1250 (500 CA) MG PO TABS: 1.0000 | ORAL_TABLET | Freq: Every day | ORAL | Status: DC

## 2017-11-22 MED ORDER — MOMETASONE FURO-FORMOTEROL FUM 100-5 MCG/ACT IN AERO
2.0000 | INHALATION_SPRAY | Freq: Two times a day (BID) | RESPIRATORY_TRACT | Status: DC
  Administered 2017-11-23 – 2017-11-25 (×5): 2 via RESPIRATORY_TRACT
  Filled 2017-11-22: qty 8.8

## 2017-11-22 MED ORDER — PANTOPRAZOLE SODIUM 40 MG PO TBEC
40.0000 mg | DELAYED_RELEASE_TABLET | Freq: Every day | ORAL | Status: DC
  Administered 2017-11-23 – 2017-11-25 (×3): 40 mg via ORAL
  Filled 2017-11-22 (×3): qty 1

## 2017-11-22 MED ORDER — SODIUM CHLORIDE 0.9% FLUSH
3.0000 mL | Freq: Two times a day (BID) | INTRAVENOUS | Status: DC
  Administered 2017-11-23 – 2017-11-24 (×3): 3 mL via INTRAVENOUS

## 2017-11-22 MED ORDER — SENNOSIDES-DOCUSATE SODIUM 8.6-50 MG PO TABS
1.0000 | ORAL_TABLET | Freq: Every evening | ORAL | Status: DC | PRN
  Administered 2017-11-24: 1 via ORAL
  Filled 2017-11-22: qty 1

## 2017-11-22 NOTE — H&P (Signed)
History and Physical    Toni RosenthalLinda F [name] ZOX:[id number] DOB: 1951-06-26 DOA: 11/22/2017  PCP: Berton BonMikell, [name], MD   Patient coming from: Home  Chief Complaint: Transient loss of consciousness x3   HPI: [name] is a 67 y.o. female with medical history significant for paroxysmal atrial fibrillation on Xarelto and status post ablation last week, chronic back pain, hypertension, insulin-dependent diabetes mellitus, and depression, now presenting to the emergency department after recurrent syncopal episodes at home.  Patient reports a history of syncope and states that she has been feeling fatigued today, had remained in bed most of the day, and was noted by her daughter to have transient episodes where she would not respond and then recover within a minute or so.  This happened 3 or 4 times today and has happened multiple times in the past.  She reports following with electrophysiologist and had recently been recommended for pacer, but she declined this at the time.  Patient reports that she has reconsidered and wants to proceed with pacer placement if cardiology still feels this is recommended.  She denies any chest pain or shortness of breath at this time.  She denies any fevers or chills.  No headache, change in vision or hearing, or focal numbness or weakness.  ED Course: Upon arrival to the ED, patient is found to be afebrile, saturating well on room air, bradycardic in the 50s, and with stable blood pressure.  EKG features a sinus bradycardia with marked sinus arrhythmia.  Chest x-ray is notable for mild prominence of the cardiac silhouette and central pulmonary vasculature without edema or pneumonia.  Noncontrast head CT is negative for acute intracranial abnormality.  Chemistry panel is notable for a glucose of 337 and creatinine of 1.32, consistent with her apparent baseline.  CBC is unremarkable, troponin is normal, UDS is positive for opiates only, ethanol is undetectable, and  urinalysis is unremarkable.  Patient remains hemodynamically stable in the ED and will be admitted to the telemetry unit for ongoing evaluation and management of recurrent syncope, suspected to be cardiac in etiology given the marked arrhythmia on admission EKG.   Review of Systems:  All other systems reviewed and apart from HPI, are negative.  Past Medical History:  Diagnosis Date  . Anxiety   . Arthritis    "knees, hands, ankles, feet" (05/12/2016)  . Asthma    Dr. Sherene SiresWert  . Bipolar disorder (HCC)   . Chronic back pain    "lower and middle" (05/12/2016)  . CKD (chronic kidney disease), stage III (HCC)   . Colon polyps   . Depression   . Diverticulosis   . Epilepsy (HCC)   . Essential hypertension   . Fibromyalgia   . Gallstones   . GERD (gastroesophageal reflux disease)   . H/O hiatal hernia   . Hyperlipidemia   . IBS (irritable bowel syndrome)   . Paroxysmal A-fib (HCC)    a. On [name]. b. Recurrence 08/2014 in setting of GI illness.  . Peripheral neuropathy   . Sarcoidosis of lung (HCC)    Dr. Sherene SiresWert  . Seizures (HCC)    "epileptic; pretty regular recently" (05/12/2016)  . Syncope   . Type II diabetes mellitus (HCC)     Past Surgical History:  Procedure Laterality Date  . ABDOMINAL HYSTERECTOMY  1995  . ANTERIOR CERVICAL DECOMP/DISCECTOMY FUSION  07/11/2012   Procedure: ANTERIOR CERVICAL DECOMPRESSION/DISCECTOMY FUSION 1 LEVEL;  Surgeon: Cristi LoronJeffrey D Jenkins, MD;  Location: [facility];  Service: Neurosurgery;  Laterality: N/A;  Cervical Five-Six Anterior Cervical Decompression with Fusion Interbody Prothesis Plating and Bonegraft  . ATRIAL FIBRILLATION ABLATION N/A 05/18/2017   Procedure: Atrial Fibrillation Ablation;  Surgeon: [name], MD;  Location: [facility];  Service: Cardiovascular;  Laterality: N/A;  . ATRIAL FIBRILLATION ABLATION N/A 11/16/2017   Procedure: ATRIAL FIBRILLATION ABLATION;  Surgeon: [name], MD;  Location: [facility];  Service:  Cardiovascular;  Laterality: N/A;  . CARDIAC CATHETERIZATION  2012   a. Normal coronaries 2012. b. Normal nuc 09/2014.  Marland Kitchen CARDIAC CATHETERIZATION  05/18/2017  . CHOLECYSTECTOMY OPEN  1976  . CLOSED REDUCTION ANKLE FRACTURE Left 10/2006   "got steel rod in my leg; and screws"  . COLON SURGERY    . DILATION AND CURETTAGE OF UTERUS    . ESOPHAGEAL MANOMETRY  03/21/2012   Procedure: ESOPHAGEAL MANOMETRY (EM);  Surgeon: [name], MD;  Location: [facility];  Service: Endoscopy;  Laterality: N/A;  . FRACTURE SURGERY    . NEUROPLASTY / TRANSPOSITION MEDIAN NERVE AT CARPAL TUNNEL Left 2004  . NEUROPLASTY / TRANSPOSITION MEDIAN NERVE AT CARPAL TUNNEL Right 2002  . 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 SALPINGOOPHORECTOMY Bilateral 2000  . TEE WITHOUT CARDIOVERSION N/A 05/18/2017   Procedure: TRANSESOPHAGEAL ECHOCARDIOGRAM (TEE);  Surgeon: [name], MD;  Location: [facility] ENDOSCOPY;  Service: Cardiovascular;  Laterality: N/A;  . TUBAL LIGATION  1982     reports that she has never smoked. She has never used smokeless tobacco. She reports that she does not drink alcohol or use drugs.  Allergies  Allergen Reactions  . Prednisone Other (See Comments)    SENT PATIENT INTO A-FIB   . Amitriptyline Other (See Comments)    Made the patient disoriented  . Hydromorphone Hcl Other (See Comments)    Made the blood pressure drop (HYPOtension)    Family History  Problem Relation Age of Onset  . Heart attack Mother        @ age 47  . Mental illness Mother   . Diabetes Mother   . Hypertension Mother        siblings  . Alzheimer's disease Mother   . Depression Mother   . Hyperlipidemia Mother   . Heart attack Brother 50  . Alcohol abuse Brother   . Depression Brother   . Diabetes Brother   . Hyperlipidemia Brother   . Hypertension Brother   . Kidney disease Brother   . Drug abuse Brother   . Alcohol abuse Father   . Heart attack Father   . Hyperlipidemia Father   . Hypertension  Father   . Colon cancer Maternal Aunt   . Prostate cancer Maternal Grandfather   . Diabetes Maternal Grandfather   . Hyperlipidemia Maternal Grandfather   . Ovarian cancer Maternal Aunt   . Diabetes Unknown   . Hypertension Unknown   . Lupus Sister   . Alcohol abuse Sister   . Depression Sister   . Diabetes Sister   . Hyperlipidemia Sister   . Hypertension Sister   . Kidney disease Sister   . Drug abuse Sister   . Ovarian cancer Cousin   . Diabetes Maternal Grandmother   . Hyperlipidemia Maternal Grandmother   . Breast cancer Neg Hx      Prior to Admission medications   Medication Sig Start Date End Date Taking? Authorizing Provider  albuterol (PROAIR HFA) 108 (90 Base) MCG/ACT inhaler inhale 2 puffs every 6 hours if needed for wheezing or  shortness of breath 08/30/17  Yes Mikell, Antionette Poles, MD  atorvastatin (LIPITOR) 80 MG tablet Take 80 mg by mouth at bedtime. 05/29/16  Yes [provider]  calcium carbonate (OS-CAL) 600 MG TABS tablet Take 600 mg by mouth daily.   Yes [provider]  cholecalciferol (VITAMIN D) 1000 UNITS tablet Take 1,000 Units by mouth 2 (two) times daily.    Yes [provider]  Cranberry 500 MG CAPS Take 500 mg by mouth 2 (two) times daily.   Yes [provider]  dofetilide ([name]) 250 MCG capsule take 1 capsule by mouth twice a day Patient taking differently: Take 250 mcg by mouth twice a day 05/17/17  Yes Allred, Fayrene Fearing, MD  DULERA 100-5 MCG/ACT AERO inhale 2 puffs INTO THE LUNGS every 12 hours 11/04/16  Yes Mikell, Antionette Poles, MD  DULoxetine (CYMBALTA) 60 MG capsule Take 60 mg by mouth at bedtime.    Yes [provider]  esomeprazole (NEXIUM) 40 MG capsule Take 1 capsule (40 mg total) by mouth daily at 12 noon. Patient taking differently: Take 40 mg by mouth at bedtime.  09/29/17  Yes Meredith Pel, NP  EVZIO 0.4 MG/0.4ML SOAJ Inject 0.4 mLs as directed daily as needed (for overdose).  07/10/15  Yes  [provider]  fluticasone (FLONASE) 50 MCG/ACT nasal spray instill 2 sprays into each nostril once daily Patient taking differently: Place 1 spray into both nostrils 2 (two) times daily.  10/08/17  Yes Mikell, Antionette Poles, MD  HUMALOG KWIKPEN 100 UNIT/ML KiwkPen Inject 18-32 Units into the skin 3 (three) times daily with meals. PER Patient home SLIDING SCALE 18 units if CBG < 200 32 units  If CBG  > 200 05/04/17  Yes [provider]  Insulin Glargine (LANTUS SOLOSTAR) 100 UNIT/ML Solostar Pen Inject 30 Units into the skin at bedtime. Patient taking differently: Inject 50 Units into the skin at bedtime.  08/23/15  Yes Dunn, Dayna N, PA-C  loperamide (IMODIUM A-D) 2 MG tablet Take 1 tablet (2 mg total) by mouth 4 (four) times daily as needed for diarrhea or loose stools. 09/02/17  Yes Mikell, Antionette Poles, MD  loratadine (CLARITIN) 10 MG tablet Take 1 tablet (10 mg total) by mouth daily. 10/08/17  Yes Mikell, Antionette Poles, MD  magnesium oxide (MAG-OX) 400 MG tablet Take 1 tablet (400 mg total) by mouth 2 (two) times daily. Patient taking differently: Take 400 mg by mouth 2 (two) times daily.  04/21/17  Yes Rosalio Macadamia, NP  metoprolol tartrate (LOPRESSOR) 50 MG tablet Take 1 tablet (50 mg total) by mouth 2 (two) times daily. 05/06/17  Yes Seiler, Amber K, NP  nitroGLYCERIN (NITROSTAT) 0.4 MG SL tablet Place 1 tablet (0.4 mg total) under the tongue every 5 (five) minutes as needed for chest pain (do nto exceed 3 doses). Patient taking differently: Place 0.4 mg under the tongue every 5 (five) minutes x 3 doses as needed for chest pain.  10/15/16  Yes Wendall Stade, MD  ondansetron (ZOFRAN) 4 MG tablet Take 1 tablet (4 mg total) by mouth every 8 (eight) hours as needed for nausea or vomiting. 10/08/17  Yes Mikell, Antionette Poles, MD  oxyCODONE-acetaminophen (PERCOCET) 10-325 MG tablet Take 1 tablet by mouth 5 (five) times daily. (scheduled)    Yes [provider]  Polyvinyl  Alcohol-Povidone (REFRESH OP) Place 1 drop into both eyes 3 (three) times daily as needed (for dry eyes).    Yes [provider]  potassium chloride (K-DUR) 10 MEQ tablet take 1 tablet by mouth once daily Patient taking differently: Take 10 MEQ by mouth once daily 05/04/17  Yes Wendall Stade, MD  pregabalin (LYRICA) 100 MG capsule Take 100 mg by mouth 3 (three) times daily.   Yes [provider]  rivaroxaban (XARELTO) 20 MG TABS tablet Take 20 mg by mouth once a day with supper Patient taking differently: Take 20 mg by mouth daily with supper.  08/11/17  Yes Wendall Stade, MD  tamsulosin (FLOMAX) 0.4 MG CAPS capsule Take 0.4 mg by mouth at bedtime. 04/16/17  Yes [provider]  vitamin C (ASCORBIC ACID) 500 MG tablet Take 500 mg by mouth 2 (two) times daily.   Yes [provider]  VOLTAREN 1 % GEL APPLY 2 INCHES TO AFFECTED AREA FOUR TIMES A DAY Patient taking differently: APPLY 2 INCHES TO AFFECTED AREA THREE TIMES A DAY 01/29/17  Yes Mikell, Antionette Poles, MD  ondansetron (ZOFRAN ODT) 4 MG disintegrating tablet Take 1 tablet (4 mg total) by mouth every 8 (eight) hours as needed for nausea or vomiting. Patient not taking: Reported on 11/08/2017 03/15/17   [name], MD    Physical Exam: Vitals:   11/22/17 1721 11/22/17 1830 11/22/17 1900 11/22/17 1930  BP: (!) 159/74 (!) 151/46 (!) 175/57 (!) 172/70  Pulse: (!) 54 (!) 55 (!) 53 (!) 57  Resp: 10 13 15 17   Temp:      TempSrc:      SpO2: 97% 97% 97% 100%  Weight:      Height:          Constitutional: NAD, calm  Eyes: PERTLA, lids and conjunctivae normal ENMT: Mucous membranes are moist. Posterior pharynx clear of any exudate or lesions.   Neck: normal, supple, no masses, no thyromegaly Respiratory: clear to auscultation bilaterally, no wheezing, no crackles. Normal respiratory effort.    Cardiovascular: S1 & S2 heard, regular rate and rhythm. No significant JVD. Abdomen: No distension, no  tenderness, soft. Bowel sounds normal.  Musculoskeletal: no clubbing / cyanosis. No joint deformity upper and lower extremities.   Skin: no significant rashes, lesions, ulcers. Warm, dry, well-perfused. Neurologic: CN 2-12 grossly intact. Sensation intact. Strength 5/5 in all 4 limbs.  Psychiatric: Alert and oriented. Calm and cooperative.     Labs on Admission: I have personally reviewed following labs and imaging studies  CBC: Recent Labs  Lab 11/22/17 1245  WBC 4.0  HGB 11.9*  HCT 37.5  MCV 90.6  PLT 168   Basic Metabolic Panel: Recent Labs  Lab 11/17/17 0519 11/22/17 1245  NA 134* 137  K 4.5 4.8  CL 102 104  CO2 21* 21*  GLUCOSE 210* 337*  BUN 11 15  CREATININE 1.27* 1.32*  CALCIUM 8.3* 9.2  MG 2.0  --    GFR: Estimated Creatinine Clearance: 51.6 mL/min (A) (by C-G formula based on SCr of 1.32 mg/dL (H)). Liver Function Tests: Recent Labs  Lab 11/22/17 1650  AST 25  ALT 18  ALKPHOS 100  BILITOT 0.6  PROT 7.0  ALBUMIN 3.6   No results for input(s): LIPASE, AMYLASE in the last 168 hours. No results for input(s): AMMONIA in the last 168 hours. Coagulation Profile: No results for input(s): INR, PROTIME in the last 168 hours. Cardiac Enzymes: No results for input(s): CKTOTAL, CKMB, CKMBINDEX, TROPONINI in the last 168 hours. BNP (last 3 results) No results for input(s): PROBNP in the last 8760 hours. HbA1C: No results for  input(s): HGBA1C in the last 72 hours. CBG: Recent Labs  Lab 11/17/17 1559 11/17/17 2136 11/18/17 0825 11/18/17 1246 11/22/17 1908  GLUCAP 230* 187* 204* 212* 206*   Lipid Profile: No results for input(s): CHOL, HDL, LDLCALC, TRIG, CHOLHDL, LDLDIRECT in the last 72 hours. Thyroid Function Tests: No results for input(s): TSH, T4TOTAL, FREET4, T3FREE, THYROIDAB in the last 72 hours. Anemia Panel: No results for input(s): VITAMINB12, FOLATE, FERRITIN, TIBC, IRON, RETICCTPCT in the last 72 hours. Urine analysis:    Component  Value Date/Time   COLORURINE YELLOW 11/22/2017 1723   APPEARANCEUR CLEAR 11/22/2017 1723   LABSPEC 1.020 11/22/2017 1723   PHURINE 5.0 11/22/2017 1723   GLUCOSEU >=500 (A) 11/22/2017 1723   HGBUR NEGATIVE 11/22/2017 1723   BILIRUBINUR NEGATIVE 11/22/2017 1723   BILIRUBINUR negative 11/25/2016 1401   BILIRUBINUR NEG 01/16/2016 0943   KETONESUR NEGATIVE 11/22/2017 1723   PROTEINUR NEGATIVE 11/22/2017 1723   UROBILINOGEN 0.2 11/25/2016 1401   UROBILINOGEN 0.2 10/25/2014 2151   NITRITE NEGATIVE 11/22/2017 1723   LEUKOCYTESUR NEGATIVE 11/22/2017 1723   Sepsis Labs: @LABRCNTIP (procalcitonin:4,lacticidven:4) )No results found for this or any previous visit (from the past 240 hour(s)).   Radiological Exams on Admission: Dg Chest 2 View  Result Date: 11/22/2017 CLINICAL DATA:  Patient underwent ablation for atrial fibrillation 6 days ago and has been having tremors ever since with pain in the back and extending into the right leg. Patient reports syncopal episodes at home. EXAM: CHEST - 2 VIEW COMPARISON:  Chest x-ray of May 05, 2017 FINDINGS: The lungs are adequately inflated and clear. The heart is mildly enlarged. The central pulmonary vascularity is prominent. There is no definite cephalization of the vascular pattern. The mediastinum is normal in width. There is no pleural effusion. The bony thorax exhibits no acute abnormality. IMPRESSION: Mild prominence of the cardiac silhouette and central pulmonary vascularity. No pulmonary edema or pneumonia. Electronically Signed   By: David  [place] M.D.   On: 11/22/2017 17:07   Ct Head Wo Contrast  Result Date: 11/22/2017 CLINICAL DATA:  Tremors EXAM: CT HEAD WITHOUT CONTRAST TECHNIQUE: Contiguous axial images were obtained from the base of the skull through the vertex without intravenous contrast. COMPARISON:  MRI brain 05/13/2017, CT brain 05/10/2016 FINDINGS: Brain: No evidence of acute infarction, hemorrhage, hydrocephalus, extra-axial  collection or mass lesion/mass effect. Vascular: No hyperdense vessels. Scattered calcifications at the carotid siphons. Skull: Normal. Negative for fracture or focal lesion. Sinuses/Orbits: Mild mucosal thickening in the ethmoid sinuses. No acute orbital abnormality. Other: None IMPRESSION: 1. Negative.  No CT evidence for acute intracranial abnormality. Electronically Signed   By: Jasmine Pang M.D.   On: 11/22/2017 18:09    EKG: Independently reviewed. Sinus bradycardia with marked sinus arrhythmia.   Assessment/Plan   1. Recurrent syncope  - Presents with recurrent episodes of transient loss of consciousness, reportedly occurring 3-4 times today  - She had been fatigued and was laying down during these episodes  - There was no prodrome  - She reports similar episodes previously  - EKG notable for sinus bradycardia with marked sinus arrhythmia  - She follows with EP and PPM had been recommended, but she initially declined, now wants to proceed with PPM  - Continue cardiac monitoring, check echocardiogram, hold Lopressor and [name]    2. Paroxysmal atrial fibrillation  - In a sinus arrhythmia on admission  - She underwent ablation last week  - CHADS-VASc is at least 4 (age, gender, HTN, DM)  - Continue Xarelto;  hold [name] initially in light of bradyarrhythmia with syncope    3. Hypertension  - Hold Lopressor given bradycardia and arrhythmia  - Use hydralazine IVP's prn for now    4. Insulin-dependent DM  - A1c was 10.1% in January 2019  - Managed at home with Lantus 50 units qHS and Humalog 18-32 QID  - Check CBG's, continue Lantus with 35 units qHS initially, Novolog 3 units with meals and per sliding-scale    5. CKD stage III  - SCr is 1.32 on admission, consistent with her apparent baseline  - Renally-dose medications, avoid nephrotoxins    6. Chronic pain  - Complains of her chronic back pain on admission  - Continue home-regimen with Lyrica, Cymbalta, Voltaren, and  Percocet    DVT prophylaxis: Xarelto  Code Status: Full  Family Communication: Daughter updated at bedside Consults called: None Admission status: Inpatient    Briscoe Deutscher, MD [facility] Pager [phone]  If 7PM-7AM, please contact night-coverage [email or web address] Password [facility]  11/22/2017, 8:17 PM

## 2017-11-22 NOTE — ED Triage Notes (Signed)
Pt arrived from home, driven by daughter reports that she had ablation done Tuesday for A Fib and has been having tremors since with pain in back and down right leg. Pt reports she has had LOC a couple of times at home. Pt has been staying with daughter and reports that she had LOC 2 times yesterday and once today. Pt A&O X4 in triage with mild tremor.

## 2017-11-22 NOTE — Telephone Encounter (Signed)
Patient called in stating last night she had an episode of not being aware of what she was doing (this is according to her daughter/granddaughter) stated she was incoherent "in and out" stated her temperature was "lower than normal" and her BP was elevated (she was not able to give me the readings). This morning when she called she stated she just "doesn't feel right in the head" but feels better this morning. She does not remember what happened last night with her daughters she thought she was just sleeping. She feels very unsteady on her feet. SBP in the 160s even though she has taken her medications. Pt was coherent on the phone but sounded very fatigued.  Pt was going to ER to be further assessed.

## 2017-11-22 NOTE — ED Notes (Signed)
Pt fed [place]key sandwich and graham crackers

## 2017-11-22 NOTE — ED Notes (Signed)
Pt not cooperating with neuro exam.  Keeps getting distracted by family in room. When asking the Pt questions she will deflect those questions to family member in room.

## 2017-11-22 NOTE — ED Provider Notes (Signed)
[facility] EMERGENCY DEPARTMENT Provider Note   CSN: [id number] Arrival date & time: 11/22/17  1228     History   Chief Complaint Chief Complaint  Patient presents with  . Tremors  . Loss of Consciousness    HPI [name] is a 67 y.o. female with a history of A. fib status post ablation on 11/16/17, with a history of bipolar, CKD 3, epilepsy, fibromyalgia, depression, GERD, IBS, sarcoidosis of lung, who presents today for evaluation of tremors and 4 episodes of loss of consciousness.  Her daughter reports that she has not really been out of bed since the surgery.  The only reason her daughter know she has a loss of consciousness is because she starts loudly snoring and is not immediately responding.  She reportedly never rebounded after her ablation according to her daughter.  Last pain medicine was this morning at about 730.  History is provided by daughter.  Patient reportedly had an episode of chest pain last night but is unable to tell me about it.    HPI  Past Medical History:  Diagnosis Date  . Anxiety   . Arthritis    "knees, hands, ankles, feet" (05/12/2016)  . Asthma    Dr. [name]  . Atrial fibrillation (HCC)   . Bipolar disorder (HCC)   . Chronic back pain    "lower and middle" (05/12/2016)  . CKD (chronic kidney disease), stage III (HCC)   . Colon polyps   . Depression   . Diverticulosis   . Epilepsy (HCC)   . Essential hypertension   . Fibromyalgia   . Gallstones   . GERD (gastroesophageal reflux disease)   . H/O hiatal hernia   . Hyperlipidemia   . IBS (irritable bowel syndrome)   . Paroxysmal A-fib (HCC)    a. On [name]. b. Recurrence 08/2014 in setting of GI illness.  . Peripheral neuropathy   . Sarcoidosis of lung (HCC)    Dr. [name]  . Seizures (HCC)    "epileptic; pretty regular recently" (05/12/2016)  . Type II diabetes mellitus [facility])     Patient Active Problem List   Diagnosis Date Noted  . Diarrhea 09/03/2017  . Estrogen  deficiency 06/07/2017  . Healthcare maintenance 06/03/2017  . Paroxysmal atrial fibrillation (HCC) 05/18/2017  . A-fib (HCC) 05/05/2017  . Back pain 12/11/2016  . Pelvic pain 11/25/2016  . Chronic pain 09/08/2016  . Spells of decreased attentiveness 09/08/2016  . Persistent atrial fibrillation (HCC)   . Irritable bowel syndrome 03/03/2016  . Chronic leukopenia 09/13/2015  . PAF (paroxysmal atrial fibrillation) (HCC) 08/22/2015  . Morbid obesity (HCC) 07/11/2015  . Bipolar I disorder (HCC) 05/22/2015  . Chronic kidney disease (CKD), stage III (moderate) (HCC) 05/14/2015  . Insulin dependent diabetes mellitus (HCC)   . Seizure disorder (HCC)   . History of diverticulitis   . Syncope 06/29/2012  . Orthostatic hypotension 06/29/2012  . History of colonic polyps 09/01/2011  . Sarcoid (HCC) 09/01/2011  . GERD (gastroesophageal reflux disease) 09/01/2011  . 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 Diabetes mellitus type 2, uncontrolled, with complications (HCC) 11/19/2008  . Hyperlipidemia 11/19/2008  . Essential hypertension 11/19/2008    Past Surgical History:  Procedure Laterality Date  . ABDOMINAL HYSTERECTOMY  1995  . ANTERIOR CERVICAL DECOMP/DISCECTOMY FUSION  07/11/2012   Procedure: ANTERIOR CERVICAL DECOMPRESSION/DISCECTOMY FUSION 1 LEVEL;  Surgeon: [name], MD;  Location: [facility] NEURO ORS;  Service: Neurosurgery;  Laterality: N/A;  Cervical Five-Six Anterior Cervical Decompression with Fusion  Interbody Prothesis Plating and Bonegraft  . ATRIAL FIBRILLATION ABLATION N/A 05/18/2017   Procedure: Atrial Fibrillation Ablation;  Surgeon: [name], MD;  Location: [facility] INVASIVE CV LAB;  Service: Cardiovascular;  Laterality: N/A;  . ATRIAL FIBRILLATION ABLATION N/A 11/16/2017   Procedure: ATRIAL FIBRILLATION ABLATION;  Surgeon: [name], MD;  Location: [facility] INVASIVE CV LAB;  Service: Cardiovascular;  Laterality: N/A;  . CARDIAC CATHETERIZATION  2012   a. Normal coronaries 2012. b.  Normal nuc 09/2014.  Marland Kitchen CARDIAC CATHETERIZATION  05/18/2017  . CHOLECYSTECTOMY OPEN  1976  . CLOSED REDUCTION ANKLE FRACTURE Left 10/2006   "got steel rod in my leg; and screws"  . COLON SURGERY    . DILATION AND CURETTAGE OF UTERUS    . ESOPHAGEAL MANOMETRY  03/21/2012   Procedure: ESOPHAGEAL MANOMETRY (EM);  Surgeon: [name], MD;  Location: [facility] ENDOSCOPY;  Service: Endoscopy;  Laterality: N/A;  . FRACTURE SURGERY    . NEUROPLASTY / TRANSPOSITION MEDIAN NERVE AT CARPAL TUNNEL Left 2004  . NEUROPLASTY / TRANSPOSITION MEDIAN NERVE AT CARPAL TUNNEL Right 2002  . 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 SALPINGOOPHORECTOMY Bilateral 2000  . TEE WITHOUT CARDIOVERSION N/A 05/18/2017   Procedure: TRANSESOPHAGEAL ECHOCARDIOGRAM (TEE);  Surgeon: [name], MD;  Location: [facility] ENDOSCOPY;  Service: Cardiovascular;  Laterality: N/A;  . TUBAL LIGATION  1982     OB History   None      Home Medications    Prior to Admission medications   Medication Sig Start Date End Date Taking? Authorizing Provider  albuterol (PROAIR HFA) 108 (90 Base) MCG/ACT inhaler inhale 2 puffs every 6 hours if needed for wheezing or shortness of breath 08/30/17  Yes Mikell, Antionette Poles, MD  atorvastatin (LIPITOR) 80 MG tablet Take 80 mg by mouth at bedtime. 05/29/16  Yes [provider]  calcium carbonate (OS-CAL) 600 MG TABS tablet Take 600 mg by mouth daily.   Yes [provider]  cholecalciferol (VITAMIN D) 1000 UNITS tablet Take 1,000 Units by mouth 2 (two) times daily.    Yes [provider]  Cranberry 500 MG CAPS Take 500 mg by mouth 2 (two) times daily.   Yes [provider]  dofetilide ([name]) 250 MCG capsule take 1 capsule by mouth twice a day Patient taking differently: Take 250 mcg by mouth twice a day 05/17/17  Yes Allred, Fayrene Fearing, MD  DULERA 100-5 MCG/ACT AERO inhale 2 puffs INTO THE LUNGS every 12 hours 11/04/16  Yes Mikell, Antionette Poles, MD  DULoxetine (CYMBALTA)  60 MG capsule Take 60 mg by mouth at bedtime.    Yes [provider]  esomeprazole (NEXIUM) 40 MG capsule Take 1 capsule (40 mg total) by mouth daily at 12 noon. Patient taking differently: Take 40 mg by mouth at bedtime.  09/29/17  Yes Meredith Pel, NP  EVZIO 0.4 MG/0.4ML SOAJ Inject 0.4 mLs as directed daily as needed (for overdose).  07/10/15  Yes [provider]  fluticasone (FLONASE) 50 MCG/ACT nasal spray instill 2 sprays into each nostril once daily Patient taking differently: Place 1 spray into both nostrils 2 (two) times daily.  10/08/17  Yes Mikell, Antionette Poles, MD  HUMALOG KWIKPEN 100 UNIT/ML KiwkPen Inject 18-32 Units into the skin 3 (three) times daily with meals. PER Patient home SLIDING SCALE 18 units if CBG < 200 32 units  If CBG  > 200 05/04/17  Yes [provider]  Insulin Glargine (LANTUS SOLOSTAR) 100 UNIT/ML Solostar Pen Inject 30  Units into the skin at bedtime. Patient taking differently: Inject 50 Units into the skin at bedtime.  08/23/15  Yes Dunn, Dayna N, PA-C  loperamide (IMODIUM A-D) 2 MG tablet Take 1 tablet (2 mg total) by mouth 4 (four) times daily as needed for diarrhea or loose stools. 09/02/17  Yes Mikell, Antionette Poles, MD  loratadine (CLARITIN) 10 MG tablet Take 1 tablet (10 mg total) by mouth daily. 10/08/17  Yes Mikell, Antionette Poles, MD  magnesium oxide (MAG-OX) 400 MG tablet Take 1 tablet (400 mg total) by mouth 2 (two) times daily. Patient taking differently: Take 400 mg by mouth 2 (two) times daily.  04/21/17  Yes Rosalio Macadamia, NP  metoprolol tartrate (LOPRESSOR) 50 MG tablet Take 1 tablet (50 mg total) by mouth 2 (two) times daily. 05/06/17  Yes Seiler, Amber K, NP  nitroGLYCERIN (NITROSTAT) 0.4 MG SL tablet Place 1 tablet (0.4 mg total) under the tongue every 5 (five) minutes as needed for chest pain (do nto exceed 3 doses). Patient taking differently: Place 0.4 mg under the tongue every 5 (five) minutes x 3 doses as needed for  chest pain.  10/15/16  Yes Wendall Stade, MD  ondansetron (ZOFRAN) 4 MG tablet Take 1 tablet (4 mg total) by mouth every 8 (eight) hours as needed for nausea or vomiting. 10/08/17  Yes Mikell, Antionette Poles, MD  oxyCODONE-acetaminophen (PERCOCET) 10-325 MG tablet Take 1 tablet by mouth 5 (five) times daily. (scheduled)    Yes [provider]  Polyvinyl Alcohol-Povidone (REFRESH OP) Place 1 drop into both eyes 3 (three) times daily as needed (for dry eyes).    Yes [provider]  potassium chloride (K-DUR) 10 MEQ tablet take 1 tablet by mouth once daily Patient taking differently: Take 10 MEQ by mouth once daily 05/04/17  Yes Wendall Stade, MD  pregabalin (LYRICA) 100 MG capsule Take 100 mg by mouth 3 (three) times daily.   Yes [provider]  rivaroxaban (XARELTO) 20 MG TABS tablet Take 20 mg by mouth once a day with supper Patient taking differently: Take 20 mg by mouth daily with supper.  08/11/17  Yes Wendall Stade, MD  tamsulosin (FLOMAX) 0.4 MG CAPS capsule Take 0.4 mg by mouth at bedtime. 04/16/17  Yes [provider]  vitamin C (ASCORBIC ACID) 500 MG tablet Take 500 mg by mouth 2 (two) times daily.   Yes [provider]  VOLTAREN 1 % GEL APPLY 2 INCHES TO AFFECTED AREA FOUR TIMES A DAY Patient taking differently: APPLY 2 INCHES TO AFFECTED AREA THREE TIMES A DAY 01/29/17  Yes Mikell, Antionette Poles, MD  ondansetron (ZOFRAN ODT) 4 MG disintegrating tablet Take 1 tablet (4 mg total) by mouth every 8 (eight) hours as needed for nausea or vomiting. Patient not taking: Reported on 11/08/2017 03/15/17   [name], MD    Family History Family History  Problem Relation Age of Onset  . Heart attack Mother        @ age 61  . Mental illness Mother   . Diabetes Mother   . Hypertension Mother        siblings  . Alzheimer's disease Mother   . Depression Mother   . Hyperlipidemia Mother   . Heart attack Brother 50  . Alcohol abuse Brother     . Depression Brother   . Diabetes Brother   . Hyperlipidemia Brother   . Hypertension Brother   . Kidney disease Brother   .  Drug abuse Brother   . Alcohol abuse Father   . Heart attack Father   . Hyperlipidemia Father   . Hypertension Father   . Colon cancer Maternal Aunt   . Prostate cancer Maternal Grandfather   . Diabetes Maternal Grandfather   . Hyperlipidemia Maternal Grandfather   . Ovarian cancer Maternal Aunt   . Diabetes Unknown   . Hypertension Unknown   . Lupus Sister   . Alcohol abuse Sister   . Depression Sister   . Diabetes Sister   . Hyperlipidemia Sister   . Hypertension Sister   . Kidney disease Sister   . Drug abuse Sister   . Ovarian cancer Cousin   . Diabetes Maternal Grandmother   . Hyperlipidemia Maternal Grandmother   . Breast cancer Neg Hx     Social History Social History   Tobacco Use  . Smoking status: Never Smoker  . Smokeless tobacco: Never Used  Substance Use Topics  . Alcohol use: No  . Drug use: No     Allergies   Prednisone; Amitriptyline; and Hydromorphone hcl   Review of Systems Review of Systems  Unable to perform ROS: Mental status change     Physical Exam Updated Vital Signs BP (!) 171/90   Pulse (!) 52   Temp 98.3 F (36.8 C) (Oral)   Resp 13   Ht 5\' 7"  (1.702 m)   Wt 99.8 kg (220 lb)   SpO2 97%   BMI 34.46 kg/m   Physical Exam  Constitutional: She appears well-developed and well-nourished. No distress.  HENT:  Head: Normocephalic and atraumatic.  Eyes: Conjunctivae are normal.  Neck: Neck supple.  Cardiovascular: Normal rate, normal heart sounds and intact distal pulses. Exam reveals no gallop and no friction rub.  No murmur heard. Pulmonary/Chest: Effort normal and breath sounds normal. No respiratory distress.  Abdominal: Soft. Bowel sounds are normal. She exhibits no distension. There is no tenderness.  Musculoskeletal: She exhibits no edema.  Neurological: She is alert. GCS eye subscore is 4.  GCS verbal subscore is 4. GCS motor subscore is 6.  Patient is alert and oriented to person and place, thinks it is march of 2016.   PEARLA, unable to participate in EOM testing.   5/5 strength in bilateral upper and lower extremities.  No facial droop, smile is symmetrical.  No pronator drioft  Skin: Skin is warm and dry.  Psychiatric: She has a normal mood and affect.  Nursing note and vitals reviewed.    ED Treatments / Results  Labs (all labs ordered are listed, but only abnormal results are displayed) Labs Reviewed  BASIC METABOLIC PANEL - Abnormal; Notable for the following components:      Result Value   CO2 21 (*)    Glucose, Bld 337 (*)    Creatinine, Ser 1.32 (*)    GFR calc non Af Amer 41 (*)    GFR calc Af Amer 48 (*)    All other components within normal limits  CBC - Abnormal; Notable for the following components:   Hemoglobin 11.9 (*)    All other components within normal limits  URINALYSIS, ROUTINE W REFLEX MICROSCOPIC - Abnormal; Notable for the following components:   Glucose, UA >=500 (*)    Squamous Epithelial / LPF 0-5 (*)    All other components within normal limits  RAPID URINE DRUG SCREEN, HOSP PERFORMED - Abnormal; Notable for the following components:   Opiates POSITIVE (*)    All other components within normal  limits  GLUCOSE, CAPILLARY - Abnormal; Notable for the following components:   Glucose-Capillary 257 (*)    All other components within normal limits  CBG MONITORING, ED - Abnormal; Notable for the following components:   Glucose-Capillary 206 (*)    All other components within normal limits  HEPATIC FUNCTION PANEL  ETHANOL  BASIC METABOLIC PANEL  MAGNESIUM  CBC  I-STAT TROPONIN, ED  CBG MONITORING, ED    EKG EKG Interpretation  Date/Time:  Monday November 22 2017 12:47:16 EDT Ventricular Rate:  53 PR Interval:  188 QRS Duration: 84 QT Interval:  516 QTC Calculation: 484 R Axis:   -11 Text Interpretation:  Sinus bradycardia with  marked sinus arrhythmia Low voltage QRS Cannot rule out Anterior infarct , age undetermined T wave abnormality, consider lateral ischemia Abnormal ECG Confirmed by Tilden Fossa [phone]) on 11/22/2017 4:34:44 PM   Radiology Dg Chest 2 View  Result Date: 11/22/2017 CLINICAL DATA:  Patient underwent ablation for atrial fibrillation 6 days ago and has been having tremors ever since with pain in the back and extending into the right leg. Patient reports syncopal episodes at home. EXAM: CHEST - 2 VIEW COMPARISON:  Chest x-ray of May 05, 2017 FINDINGS: The lungs are adequately inflated and clear. The heart is mildly enlarged. The central pulmonary vascularity is prominent. There is no definite cephalization of the vascular pattern. The mediastinum is normal in width. There is no pleural effusion. The bony thorax exhibits no acute abnormality. IMPRESSION: Mild prominence of the cardiac silhouette and central pulmonary vascularity. No pulmonary edema or pneumonia. Electronically Signed   By: David  [place] M.D.   On: 11/22/2017 17:07   Ct Head Wo Contrast  Result Date: 11/22/2017 CLINICAL DATA:  Tremors EXAM: CT HEAD WITHOUT CONTRAST TECHNIQUE: Contiguous axial images were obtained from the base of the skull through the vertex without intravenous contrast. COMPARISON:  MRI brain 05/13/2017, CT brain 05/10/2016 FINDINGS: Brain: No evidence of acute infarction, hemorrhage, hydrocephalus, extra-axial collection or mass lesion/mass effect. Vascular: No hyperdense vessels. Scattered calcifications at the carotid siphons. Skull: Normal. Negative for fracture or focal lesion. Sinuses/Orbits: Mild mucosal thickening in the ethmoid sinuses. No acute orbital abnormality. Other: None IMPRESSION: 1. Negative.  No CT evidence for acute intracranial abnormality. Electronically Signed   By: Jasmine Pang M.D.   On: 11/22/2017 18:09    Procedures Procedures (including critical care time)  Medications Ordered in  ED Medications - No data to display   Initial Impression / Assessment and Plan / ED Course  I have reviewed the triage vital signs and the nursing notes.  Pertinent labs & imaging results that were available during my care of the patient were reviewed by me and considered in my medical decision making (see chart for details).  Clinical Course as of Nov 22 2057  Mon Nov 22, 2017  1901 Patient reevaluated, she now appears more alert, is alert and oriented x4 although she has to think about what year it is.  She reports that she does not remember me from when I came in earlier.   [EH]  1902 Spoke with RN about needing CBG for insulin dosing    [EH]  1920 Spoke with Dr. Antionette Char who will come see patient.    [EH]    Clinical Course User Index [EH] Cristina Gong, PA-C   Patient presents today for evaluation of tremors, for reported episodes of loss of consciousness, and altered mental status.  On initial evaluation she was alert and  oriented to person and place, however not to time.  She is status post ablation for A. Fib on 11/16/17.  Her daughter reports she has not been acting normal since then and has been taking her rx pain pills with last dose around 7-8 am this morning.  CBC without leukocytosis, mild anemia at 11.9.  MP with GFR/creatinine consistent with the patient's normal.  Initial glucose when patient first got here was 337, however repeat CBG 206, insulin held.  CT head unchanged from her normal.  CXR with out acute abnormalities.   Initial troponin normal.  Due to altered mental status, which improved while she was in the emergency room, along with her tremors and reportedly for syncopal events in the past day hospitalist was consulted for admission who agreed to admit the patient.  This patient was seen as a shared visit with Dr. Madilyn Hookees who evaluated the patient and agreed with my plan.    Final Clinical Impressions(s) / ED Diagnoses   Final diagnoses:  Type 2 diabetes mellitus  with complication, with long-term current use of insulin (HCC)  Bipolar affective disorder, remission status unspecified (HCC)  Atrial fibrillation, unspecified type (HCC)  Chronic back pain, unspecified back location, unspecified back pain laterality  CKD (chronic kidney disease), stage III (HCC)  Depression, unspecified depression type  Anxiety  Paroxysmal A-fib [facility](HCC)    ED Discharge Orders    None       Norman ClayHammond, Aleane Wesenberg W, PA-C 11/22/17 2248    Tilden Fossaees, Maize Brittingham, MD 11/22/17 2256

## 2017-11-22 NOTE — ED Notes (Signed)
Patient transported to X-ray 

## 2017-11-22 NOTE — ED Notes (Signed)
Pt made aware we need a urine sample

## 2017-11-23 ENCOUNTER — Encounter (HOSPITAL_COMMUNITY): Payer: Self-pay | Admitting: Internal Medicine

## 2017-11-23 ENCOUNTER — Inpatient Hospital Stay (HOSPITAL_COMMUNITY): Payer: Medicare Other

## 2017-11-23 DIAGNOSIS — I480 Paroxysmal atrial fibrillation: Principal | ICD-10-CM

## 2017-11-23 DIAGNOSIS — R55 Syncope and collapse: Secondary | ICD-10-CM

## 2017-11-23 DIAGNOSIS — R569 Unspecified convulsions: Secondary | ICD-10-CM

## 2017-11-23 DIAGNOSIS — I495 Sick sinus syndrome: Secondary | ICD-10-CM

## 2017-11-23 LAB — URINALYSIS, ROUTINE W REFLEX MICROSCOPIC
BILIRUBIN URINE: NEGATIVE
Glucose, UA: NEGATIVE mg/dL
HGB URINE DIPSTICK: NEGATIVE
KETONES UR: NEGATIVE mg/dL
Leukocytes, UA: NEGATIVE
Nitrite: NEGATIVE
PH: 8 (ref 5.0–8.0)
Protein, ur: NEGATIVE mg/dL
SPECIFIC GRAVITY, URINE: 1.005 (ref 1.005–1.030)

## 2017-11-23 LAB — GLUCOSE, CAPILLARY
GLUCOSE-CAPILLARY: 140 mg/dL — AB (ref 65–99)
Glucose-Capillary: 189 mg/dL — ABNORMAL HIGH (ref 65–99)
Glucose-Capillary: 157 mg/dL — ABNORMAL HIGH (ref 65–99)
Glucose-Capillary: 179 mg/dL — ABNORMAL HIGH (ref 65–99)

## 2017-11-23 LAB — BASIC METABOLIC PANEL
ANION GAP: 9 (ref 5–15)
BUN: 13 mg/dL (ref 6–20)
CALCIUM: 9 mg/dL (ref 8.9–10.3)
CO2: 26 mmol/L (ref 22–32)
CREATININE: 1.19 mg/dL — AB (ref 0.44–1.00)
Chloride: 105 mmol/L (ref 101–111)
GFR calc Af Amer: 54 mL/min — ABNORMAL LOW (ref >60)
GFR calc non Af Amer: 47 mL/min — ABNORMAL LOW (ref >60)
GLUCOSE: 142 mg/dL — AB (ref 65–99)
Potassium: 4.1 mmol/L (ref 3.5–5.1)
Sodium: 140 mmol/L (ref 135–145)

## 2017-11-23 LAB — CBC
HCT: 36.7 % (ref 36.0–46.0)
Hemoglobin: 11.8 g/dL — ABNORMAL LOW (ref 12.0–15.0)
MCH: 29.2 pg (ref 26.0–34.0)
MCHC: 32.2 g/dL (ref 30.0–36.0)
MCV: 90.8 fL (ref 78.0–100.0)
PLATELETS: 157 10*3/uL (ref 150–400)
RBC: 4.04 MIL/uL (ref 3.87–5.11)
RDW: 14.3 % (ref 11.5–15.5)
WBC: 3.8 10*3/uL — ABNORMAL LOW (ref 4.0–10.5)

## 2017-11-23 LAB — HEMOGLOBIN A1C
Hgb A1c MFr Bld: 9.3 % — ABNORMAL HIGH (ref 4.8–5.6)
Mean Plasma Glucose: 220.21 mg/dL

## 2017-11-23 LAB — ECHOCARDIOGRAM COMPLETE
HEIGHTINCHES: 67 in
WEIGHTICAEL: 3459.2 [oz_av]

## 2017-11-23 LAB — TROPONIN I
TROPONIN I: 0.04 ng/mL — AB (ref <0.03)
Troponin I: 0.03 ng/mL (ref <0.03)

## 2017-11-23 LAB — MAGNESIUM: Magnesium: 1.9 mg/dL (ref 1.7–2.4)

## 2017-11-23 LAB — AMMONIA: Ammonia: 39 umol/L — ABNORMAL HIGH (ref 9–35)

## 2017-11-23 MED ORDER — AMLODIPINE BESYLATE 5 MG PO TABS
5.0000 mg | ORAL_TABLET | Freq: Every day | ORAL | Status: DC
  Administered 2017-11-23 – 2017-11-25 (×3): 5 mg via ORAL
  Filled 2017-11-23 (×3): qty 1

## 2017-11-23 MED ORDER — METOPROLOL TARTRATE 5 MG/5ML IV SOLN: 2.5000 mg | Freq: Once | INTRAVENOUS | Status: DC

## 2017-11-23 MED ORDER — CALCIUM CARBONATE 1250 (500 CA) MG PO TABS
1.0000 | ORAL_TABLET | Freq: Every day | ORAL | Status: DC
  Administered 2017-11-23 – 2017-11-25 (×3): 500 mg via ORAL
  Filled 2017-11-23 (×3): qty 1

## 2017-11-23 MED ORDER — AMIODARONE HCL 200 MG PO TABS
200.0000 mg | ORAL_TABLET | Freq: Two times a day (BID) | ORAL | Status: DC
  Administered 2017-11-23 – 2017-11-25 (×5): 200 mg via ORAL
  Filled 2017-11-23 (×5): qty 1

## 2017-11-23 MED ORDER — NITROGLYCERIN 0.4 MG SL SUBL
0.4000 mg | SUBLINGUAL_TABLET | SUBLINGUAL | Status: DC | PRN
  Administered 2017-11-23: 0.4 mg via SUBLINGUAL

## 2017-11-23 NOTE — Progress Notes (Signed)
Visited with patient via Spiritual Care Consult.  Patient sharing information regarding procedures she has had and how she feels her overall health is going.  Patient is of [employer] faith.  Prayer request.  Prayer and continual learning a part of her story.     11/23/17 1037  Clinical Encounter Type  Visited With Patient;Health care provider  Visit Type Initial;Spiritual support  Spiritual Encounters  Spiritual Needs Prayer

## 2017-11-23 NOTE — Consult Note (Signed)
NEURO HOSPITALIST CONSULT NOTE   Requestig physician: Dr. Pollie Meyer  Reason for Consult: 4 episodes of LOC   History obtained from:    Patient and Chart     HPI:                                                                                                                                          [name] is an 67 y.o. female presenting for evaluation after 4 episodes of LOC at home, each lasting about 3-5 minutes. The first 3 occurred on Sunday and the last on Monday. Her first spell was accompanied by fecal incontinence. The spells were all witnessed by her daughter. The patient states that her daughter told her that she was speaking gibberish, "shaking my arms and legs" and exclaiming during the spells; the patient has no memory of the events.   She has a history of seizures when she was 13, which were treated with phenobarbital. She states that the seizures stopped occurring and that subsequently phenobarbital was discontinued. She had no further spells during her lifetime until the possible seizure like episodes occurring early this week.  Despite the above, PMHx documented in Epic lists epileptic seizures "pretty regular recently" back in 2017.   Extensive home medications list reviewed. None of the listed medications is an anticonvulsant.   Past Medical History:  Diagnosis Date  . Anxiety   . Arthritis    "knees, hands, ankles, feet" (05/12/2016)  . Asthma    Dr. [name]  . Bipolar disorder (HCC)   . Chronic back pain    "lower and middle" (05/12/2016)  . CKD (chronic kidney disease), stage III (HCC)   . Colon polyps   . Depression   . Diverticulosis   . Epilepsy (HCC)   . Essential hypertension   . Fibromyalgia   . Gallstones   . GERD (gastroesophageal reflux disease)   . H/O hiatal hernia   . Hyperlipidemia   . IBS (irritable bowel syndrome)   . Paroxysmal A-fib (HCC)    failed medical therapy with tikosyn, s/p AF ablation x 2  . Peripheral  neuropathy   . Sarcoidosis of lung (HCC)    Dr. [name]  . Seizures (HCC)    "epileptic; pretty regular recently" (05/12/2016)  . Syncope   . Type II diabetes mellitus (HCC)     Past Surgical History:  Procedure Laterality Date  . ABDOMINAL HYSTERECTOMY  1995  . ANTERIOR CERVICAL DECOMP/DISCECTOMY FUSION  07/11/2012   Procedure: ANTERIOR CERVICAL DECOMPRESSION/DISCECTOMY FUSION 1 LEVEL;  Surgeon: [name], MD;  Location: [facility];  Service: Neurosurgery;  Laterality: N/A;  Cervical Five-Six Anterior Cervical Decompression with Fusion Interbody Prothesis Plating and Bonegraft  . ATRIAL FIBRILLATION ABLATION N/A 05/18/2017   Procedure: Atrial Fibrillation  Ablation;  Surgeon: [name], MD;  Location: [facility];  Service: Cardiovascular;  Laterality: N/A;  . ATRIAL FIBRILLATION ABLATION N/A 11/16/2017   Procedure: ATRIAL FIBRILLATION ABLATION;  Surgeon: [name], MD;  Location: [facility] INVASIVE CV LAB;  Service: Cardiovascular;  Laterality: N/A;  . CARDIAC CATHETERIZATION  2012   a. Normal coronaries 2012. b. Normal nuc 09/2014.  Marland Kitchen CARDIAC CATHETERIZATION  05/18/2017  . CHOLECYSTECTOMY OPEN  1976  . CLOSED REDUCTION ANKLE FRACTURE Left 10/2006   "got steel rod in my leg; and screws"  . COLON SURGERY    . DILATION AND CURETTAGE OF UTERUS    . ESOPHAGEAL MANOMETRY  03/21/2012   Procedure: ESOPHAGEAL MANOMETRY (EM);  Surgeon: [name], MD;  Location: [facility];  Service: Endoscopy;  Laterality: N/A;  . FRACTURE SURGERY    . NEUROPLASTY / TRANSPOSITION MEDIAN NERVE AT CARPAL TUNNEL Left 2004  . NEUROPLASTY / TRANSPOSITION MEDIAN NERVE AT CARPAL TUNNEL Right 2002  . 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 SALPINGOOPHORECTOMY Bilateral 2000  . TEE WITHOUT CARDIOVERSION N/A 05/18/2017   Procedure: TRANSESOPHAGEAL ECHOCARDIOGRAM (TEE);  Surgeon: [name], MD;  Location: [facility] ENDOSCOPY;  Service: Cardiovascular;  Laterality: N/A;  . TUBAL LIGATION  1982     Family History  Problem Relation Age of Onset  . Heart attack Mother        @ age 16  . Mental illness Mother   . Diabetes Mother   . Hypertension Mother        siblings  . Alzheimer's disease Mother   . Depression Mother   . Hyperlipidemia Mother   . Heart attack Brother 50  . Alcohol abuse Brother   . Depression Brother   . Diabetes Brother   . Hyperlipidemia Brother   . Hypertension Brother   . Kidney disease Brother   . Drug abuse Brother   . Alcohol abuse Father   . Heart attack Father   . Hyperlipidemia Father   . Hypertension Father   . Colon cancer Maternal Aunt   . Prostate cancer Maternal Grandfather   . Diabetes Maternal Grandfather   . Hyperlipidemia Maternal Grandfather   . Ovarian cancer Maternal Aunt   . Diabetes Unknown   . Hypertension Unknown   . Lupus Sister   . Alcohol abuse Sister   . Depression Sister   . Diabetes Sister   . Hyperlipidemia Sister   . Hypertension Sister   . Kidney disease Sister   . Drug abuse Sister   . Ovarian cancer Cousin   . Diabetes Maternal Grandmother   . Hyperlipidemia Maternal Grandmother   . Breast cancer Neg Hx               Social History:  reports that she has never smoked. She has never used smokeless tobacco. She reports that she does not drink alcohol or use drugs.  Allergies  Allergen Reactions  . Prednisone Other (See Comments)    SENT PATIENT INTO A-FIB   . Amitriptyline Other (See Comments)    Made the patient disoriented  . Hydromorphone Hcl Other (See Comments)    Made the blood pressure drop (HYPOtension)    MEDICATIONS:  Scheduled: . amiodarone  200 mg Oral BID  . amLODipine  5 mg Oral Daily  . atorvastatin  80 mg Oral q1800  . calcium carbonate  1 tablet Oral Q breakfast  . cholecalciferol  1,000 Units Oral BID  . DULoxetine  60 mg Oral QHS  . insulin aspart  0-15 Units  Subcutaneous TID WC  . insulin aspart  0-5 Units Subcutaneous QHS  . insulin aspart  4 Units Subcutaneous TID WC  . insulin glargine  35 Units Subcutaneous QHS  . magnesium oxide  400 mg Oral BID  . mometasone-formoterol  2 puff Inhalation BID  . pantoprazole  40 mg Oral Daily  . pregabalin  100 mg Oral TID  . rivaroxaban  20 mg Oral Q supper  . sodium chloride flush  3 mL Intravenous Q12H  . sodium chloride flush  3 mL Intravenous Q12H  . tamsulosin  0.4 mg Oral QHS  . vitamin C  500 mg Oral BID   Continuous: . sodium chloride       ROS:                                                                                                                                       Low back pain, chronic. Other ROS as per HPI.   Blood pressure (!) 160/54, pulse 63, temperature 97.9 F (36.6 C), temperature source Oral, resp. rate 18, height 5\' 7"  (1.702 m), weight 98.1 kg (216 lb 3.2 oz), SpO2 95 %.   General Examination:                                                                                                      Physical Exam  General: Morbidly obese HEENT-  [place]/AT   Lungs- Respirations unlabored Ext: Warm and well perfused   Neurological Examination Mental Status: Alert, oriented, thought content appropriate.  Speech fluent without evidence of aphasia.  Able to follow all commands without difficulty. Cranial Nerves: II: Visual fields grossly normal, PERRL III,IV, VI: ptosis not present, extra-ocular motions intact bilaterally  V,VII: smile symmetric, facial temp sensation decreased on the left VIII: hearing intact to voice IX,X: Palate rises symmetrically XI: Symmetric XII: midline tongue extension Motor: Right : Upper extremity   5/5    Left:     Upper extremity   5/5  Lower extremity   5/5     Lower extremity   5/5 Normal tone throughout; no atrophy noted No drift.  Action tremor present to  upper ext bilaterally, possibly with a component of embellishment; the tremor  disappears when she is tending to a wet towel placed on top of her head Sensory: Temp sensation decreased to LUE and LLE Deep Tendon Reflexes: 2+ and symmetric throughout Plantars: Right: downgoing  Left: downgoing Cerebellar: No ataxia with FNF bilaterally Gait: Deferred   Lab Results: Basic Metabolic Panel: Recent Labs  Lab 11/17/17 0519 11/22/17 1245 11/23/17 0621  NA 134* 137 140  K 4.5 4.8 4.1  CL 102 104 105  CO2 21* 21* 26  GLUCOSE 210* 337* 142*  BUN 11 15 13   CREATININE 1.27* 1.32* 1.19*  CALCIUM 8.3* 9.2 9.0  MG 2.0  --  1.9    CBC: Recent Labs  Lab 11/22/17 1245 11/23/17 0621  WBC 4.0 3.8*  HGB 11.9* 11.8*  HCT 37.5 36.7  MCV 90.6 90.8  PLT 168 157    Cardiac Enzymes: Recent Labs  Lab 11/23/17 1249 11/23/17 1809  TROPONINI 0.03* 0.04*    Lipid Panel: No results for input(s): CHOL, TRIG, HDL, CHOLHDL, VLDL, LDLCALC in the last 168 hours.  Imaging: Dg Chest 2 View  Result Date: 11/23/2017 CLINICAL DATA:  Chest pain. EXAM: CHEST - 2 VIEW COMPARISON:  11/22/2017. FINDINGS: Mediastinum hilar structures normal. Mild left base subsegmental atelectasis and/or scarring. No pleural effusion or pneumothorax. Heart size normal. Cervicothoracic spine effusion. IMPRESSION: Mild left base subsegmental atelectasis and/or scarring. Electronically Signed   By: Maisie Fus  Register   On: 11/23/2017 14:24   Dg Chest 2 View  Result Date: 11/22/2017 CLINICAL DATA:  Patient underwent ablation for atrial fibrillation 6 days ago and has been having tremors ever since with pain in the back and extending into the right leg. Patient reports syncopal episodes at home. EXAM: CHEST - 2 VIEW COMPARISON:  Chest x-ray of May 05, 2017 FINDINGS: The lungs are adequately inflated and clear. The heart is mildly enlarged. The central pulmonary vascularity is prominent. There is no definite cephalization of the vascular pattern. The mediastinum is normal in width. There is no pleural  effusion. The bony thorax exhibits no acute abnormality. IMPRESSION: Mild prominence of the cardiac silhouette and central pulmonary vascularity. No pulmonary edema or pneumonia. Electronically Signed   By: David  [place] M.D.   On: 11/22/2017 17:07   Ct Head Wo Contrast  Result Date: 11/22/2017 CLINICAL DATA:  Tremors EXAM: CT HEAD WITHOUT CONTRAST TECHNIQUE: Contiguous axial images were obtained from the base of the skull through the vertex without intravenous contrast. COMPARISON:  MRI brain 05/13/2017, CT brain 05/10/2016 FINDINGS: Brain: No evidence of acute infarction, hemorrhage, hydrocephalus, extra-axial collection or mass lesion/mass effect. Vascular: No hyperdense vessels. Scattered calcifications at the carotid siphons. Skull: Normal. Negative for fracture or focal lesion. Sinuses/Orbits: Mild mucosal thickening in the ethmoid sinuses. No acute orbital abnormality. Other: None IMPRESSION: 1. Negative.  No CT evidence for acute intracranial abnormality. Electronically Signed   By: Jasmine Pang M.D.   On: 11/22/2017 18:09   Assessment: 67 year old female with new onset of spells of LOC with shaking 1. Possible seizures but semiology that daughter described to patient is atypical. Could be frontal lobe seizures with generalized onset, but would expect focal onset of seizure with either simple partial or partial complex semiology if onset is later in life. She does state that she had seizures at the age of 44 that resolved, which would put her at increased risk for seizures later in life. Other possible etiologies include atypical syncopal events with  convulsions (aka convulsive syncope) as well as malingering or factitious disorder (distractable tremor appearing most consistent with embellishment is seen on today's exam).  2. Multiple medical comorbidities.  Recommendations:  1. EEG.  2. MRI brain  3. Pending results of the above, may start on an anticonvulsant or recommend further outpatient  work up with portable EEG monitoring (Digitrace)  Electronically signed: Dr. Caryl PinaEric Severus Brodzinski    11/23/2017, 8:14 PM

## 2017-11-23 NOTE — Progress Notes (Signed)
Called by attending service with pt c/o CP, nausea.  The patient reports while getting her echo with pressure on her abdomen/chest during the exam made her feel poorly, nauseous.  She is s/p s/l NTG and feeling better, though remains aware of her heart beat.  In review of telemetry she has been in/out of AFib today, no bradycardia, no post-termination pauses.     The patient feels her AFib is what make her feel poorly/weak, though has not recreated any LOC here.  EKGs are reviewed, an atypical flutter, 122bpm, no ischemic looking changes.  BP is OK.  Medicine team has ordered cycled Trop.  AAO x3 Skin is warm, dry resp normal, lungs are clear Heart is iRRR, tachycardic Extrem are warm, no edema  No changes for now, would continue amiodarone as planned/ordered  Francis Dowseenee Rasheem Figiel, PA-C

## 2017-11-23 NOTE — Consult Note (Signed)
ELECTROPHYSIOLOGY CONSULT NOTE    Patient ID: JALILAH WILTSIE MRN: [id number], DOB/AGE: 10-27-1950 67 y.o.  Admit date: 11/22/2017 Date of Consult: 11/23/2017  Primary Physician: [name], MD Primary Cardiologist: Dr [name] Electrophysiologist: Dr Johney Frame  Patient Profile: GENEVE KIMPEL is a 67 y.o. female with a history of paroxysmal atrial fibrillation, prior post termination pauses, diabetes, bipolar d/o, and seizure disorder who is being seen today for the evaluation of absence periods at the request of Dr Mahala Menghini.  HPI:  CHESLEY VALLS is a 67 y.o. female is s/p recent afib ablation.  She has failed medical therapy with tikosyn.  She is s/p AF ablation x 2.  She previously did have post termination pauses with her afib.  These appear to have resolved.  I have not seen these documented since her ablation. She has fatigue.  She also has episodes of "not feeling well".  She describes that her family has observed her to have spontaneous arm and leg movements and to be not interactive, even when her eyes are open. She has had some loss of consciousness per report, however episodes last "several minutes".  She denies chest pain, palpitations, dyspnea, PND, orthopnea, nausea, vomiting, dizziness, edema, weight gain, or early satiety.  Past Medical History:  Diagnosis Date  . Anxiety   . Arthritis    "knees, hands, ankles, feet" (05/12/2016)  . Asthma    Dr. [name]  . Bipolar disorder (HCC)   . Chronic back pain    "lower and middle" (05/12/2016)  . CKD (chronic kidney disease), stage III (HCC)   . Colon polyps   . Depression   . Diverticulosis   . Epilepsy (HCC)   . Essential hypertension   . Fibromyalgia   . Gallstones   . GERD (gastroesophageal reflux disease)   . H/O hiatal hernia   . Hyperlipidemia   . IBS (irritable bowel syndrome)   . Paroxysmal A-fib (HCC)    failed medical therapy with tikosyn, s/p AF ablation x 2  . Peripheral neuropathy   . Sarcoidosis  of lung (HCC)    Dr. [name]  . Seizures (HCC)    "epileptic; pretty regular recently" (05/12/2016)  . Syncope   . Type II diabetes mellitus (HCC)      Surgical History:  Past Surgical History:  Procedure Laterality Date  . ABDOMINAL HYSTERECTOMY  1995  . ANTERIOR CERVICAL DECOMP/DISCECTOMY FUSION  07/11/2012   Procedure: ANTERIOR CERVICAL DECOMPRESSION/DISCECTOMY FUSION 1 LEVEL;  Surgeon: [name], MD;  Location: [facility];  Service: Neurosurgery;  Laterality: N/A;  Cervical Five-Six Anterior Cervical Decompression with Fusion Interbody Prothesis Plating and Bonegraft  . ATRIAL FIBRILLATION ABLATION N/A 05/18/2017   Procedure: Atrial Fibrillation Ablation;  Surgeon: [name], MD;  Location: [facility];  Service: Cardiovascular;  Laterality: N/A;  . ATRIAL FIBRILLATION ABLATION N/A 11/16/2017   Procedure: ATRIAL FIBRILLATION ABLATION;  Surgeon: [name], MD;  Location: [facility];  Service: Cardiovascular;  Laterality: N/A;  . CARDIAC CATHETERIZATION  2012   a. Normal coronaries 2012. b. Normal nuc 09/2014.  Marland Kitchen CARDIAC CATHETERIZATION  05/18/2017  . CHOLECYSTECTOMY OPEN  1976  . CLOSED REDUCTION ANKLE FRACTURE Left 10/2006   "got steel rod in my leg; and screws"  . COLON SURGERY    . DILATION AND CURETTAGE OF UTERUS    . ESOPHAGEAL MANOMETRY  03/21/2012   Procedure: ESOPHAGEAL MANOMETRY (EM);  Surgeon: [name], MD;  Location: [facility];  Service: Endoscopy;  Laterality: N/A;  . FRACTURE SURGERY    . NEUROPLASTY / TRANSPOSITION MEDIAN NERVE AT CARPAL TUNNEL Left 2004  . NEUROPLASTY / TRANSPOSITION MEDIAN NERVE AT CARPAL TUNNEL Right 2002  . 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 SALPINGOOPHORECTOMY Bilateral 2000  . TEE WITHOUT CARDIOVERSION N/A 05/18/2017   Procedure: TRANSESOPHAGEAL ECHOCARDIOGRAM (TEE);  Surgeon: Lewayne Buntingrenshaw, Brian S, MD;  [facility];  Service: Cardiovascular;  Laterality: N/A;  . TUBAL LIGATION  1982      Medications Prior to Admission  Medication Sig Dispense Refill Last Dose  . albuterol (PROAIR HFA) 108 (90 Base) MCG/ACT inhaler inhale 2 puffs every 6 hours if needed for wheezing or shortness of breath 8.5 g 1 11/21/2017 at Unknown time  . atorvastatin (LIPITOR) 80 MG tablet Take 80 mg by mouth at bedtime.  0 11/21/2017 at Unknown time  . calcium carbonate (OS-CAL) 600 MG TABS tablet Take 600 mg by mouth daily.   11/22/2017 at Unknown time  . cholecalciferol (VITAMIN D) 1000 UNITS tablet Take 1,000 Units by mouth 2 (two) times daily.    11/21/2017 at Unknown time  . Cranberry 500 MG CAPS Take 500 mg by mouth 2 (two) times daily.   11/22/2017 at Unknown time  . dofetilide ([name]) 250 MCG capsule take 1 capsule by mouth twice a day (Patient taking differently: Take 250 mcg by mouth twice a day) 180 capsule 3 11/22/2017 at Unknown time  . DULERA 100-5 MCG/ACT AERO inhale 2 puffs INTO THE LUNGS every 12 hours 13 g 11 11/21/2017 at Unknown time  . DULoxetine (CYMBALTA) 60 MG capsule Take 60 mg by mouth at bedtime.    11/21/2017 at Unknown time  . esomeprazole (NEXIUM) 40 MG capsule Take 1 capsule (40 mg total) by mouth daily at 12 noon. (Patient taking differently: Take 40 mg by mouth at bedtime. ) 30 capsule 3 11/21/2017 at Unknown time  . EVZIO 0.4 MG/0.4ML SOAJ Inject 0.4 mLs as directed daily as needed (for overdose).   0 unk at prn  . fluticasone (FLONASE) 50 MCG/ACT nasal spray instill 2 sprays into each nostril once daily (Patient taking differently: Place 1 spray into both nostrils 2 (two) times daily. ) 16 g 6 11/22/2017 at Unknown time  . HUMALOG KWIKPEN 100 UNIT/ML KiwkPen Inject 18-32 Units into the skin 3 (three) times daily with meals. PER Patient home SLIDING SCALE 18 units if CBG < 200 32 units  If CBG  > 200  0 11/21/2017 at Unknown time  . Insulin Glargine (LANTUS SOLOSTAR) 100 UNIT/ML Solostar Pen Inject 30 Units into the skin at bedtime. (Patient taking differently: Inject 50 Units into the skin at  bedtime. )   11/21/2017 at Unknown time  . loperamide (IMODIUM A-D) 2 MG tablet Take 1 tablet (2 mg total) by mouth 4 (four) times daily as needed for diarrhea or loose stools. 30 tablet 0 11/08/2017  . loratadine (CLARITIN) 10 MG tablet Take 1 tablet (10 mg total) by mouth daily. 30 tablet 0 11/22/2017 at Unknown time  . magnesium oxide (MAG-OX) 400 MG tablet Take 1 tablet (400 mg total) by mouth 2 (two) times daily. (Patient taking differently: Take 400 mg by mouth 2 (two) times daily. ) 60 tablet 11 11/22/2017 at Unknown time  . metoprolol tartrate (LOPRESSOR) 50 MG tablet Take 1 tablet (50 mg total) by mouth 2 (two) times daily. 270 tablet 3 11/22/2017 at 0800  . nitroGLYCERIN (NITROSTAT) 0.4 MG SL tablet Place 1 tablet (0.4 mg total)  under the tongue every 5 (five) minutes as needed for chest pain (do nto exceed 3 doses). (Patient taking differently: Place 0.4 mg under the tongue every 5 (five) minutes x 3 doses as needed for chest pain. ) 25 tablet 2 unk at prn  . ondansetron (ZOFRAN) 4 MG tablet Take 1 tablet (4 mg total) by mouth every 8 (eight) hours as needed for nausea or vomiting. 20 tablet 0 Past Month at Unknown time  . oxyCODONE-acetaminophen (PERCOCET) 10-325 MG tablet Take 1 tablet by mouth 5 (five) times daily. (scheduled)    11/22/2017 at Unknown time  . Polyvinyl Alcohol-Povidone (REFRESH OP) Place 1 drop into both eyes 3 (three) times daily as needed (for dry eyes).    11/21/2017 at Unknown time  . potassium chloride (K-DUR) 10 MEQ tablet take 1 tablet by mouth once daily (Patient taking differently: Take 10 MEQ by mouth once daily) 90 tablet 3 11/22/2017 at 0800  . pregabalin (LYRICA) 100 MG capsule Take 100 mg by mouth 3 (three) times daily.   11/22/2017 at Unknown time  . rivaroxaban (XARELTO) 20 MG TABS tablet Take 20 mg by mouth once a day with supper (Patient taking differently: Take 20 mg by mouth daily with supper. ) 30 tablet 9 11/21/2017 at 2030  . tamsulosin (FLOMAX) 0.4 MG CAPS capsule Take  0.4 mg by mouth at bedtime.  0 11/21/2017 at Unknown time  . vitamin C (ASCORBIC ACID) 500 MG tablet Take 500 mg by mouth 2 (two) times daily.   11/22/2017 at Unknown time  . VOLTAREN 1 % GEL APPLY 2 INCHES TO AFFECTED AREA FOUR TIMES A DAY (Patient taking differently: APPLY 2 INCHES TO AFFECTED AREA THREE TIMES A DAY) 500 g 3 11/21/2017 at Unknown time  . ondansetron (ZOFRAN ODT) 4 MG disintegrating tablet Take 1 tablet (4 mg total) by mouth every 8 (eight) hours as needed for nausea or vomiting. (Patient not taking: Reported on 11/08/2017) 10 tablet 0 Not Taking at Unknown time    Inpatient Medications:  . atorvastatin  80 mg Oral q1800  . calcium carbonate  1 tablet Oral Q breakfast  . cholecalciferol  1,000 Units Oral BID  . diclofenac sodium  2 g Topical QID  . DULoxetine  60 mg Oral QHS  . insulin aspart  0-15 Units Subcutaneous TID WC  . insulin aspart  0-5 Units Subcutaneous QHS  . insulin aspart  4 Units Subcutaneous TID WC  . insulin glargine  35 Units Subcutaneous QHS  . magnesium oxide  400 mg Oral BID  . mometasone-formoterol  2 puff Inhalation BID  . pantoprazole  40 mg Oral Daily  . pregabalin  100 mg Oral TID  . rivaroxaban  20 mg Oral Q supper  . sodium chloride flush  3 mL Intravenous Q12H  . sodium chloride flush  3 mL Intravenous Q12H  . tamsulosin  0.4 mg Oral QHS  . vitamin C  500 mg Oral BID    Allergies:  Allergies  Allergen Reactions  . Prednisone Other (See Comments)    SENT PATIENT INTO A-FIB   . Amitriptyline Other (See Comments)    Made the patient disoriented  . Hydromorphone Hcl Other (See Comments)    Made the blood pressure drop (HYPOtension)    Social History   Socioeconomic History  . Marital status: Divorced    Spouse name: Not on file  . Number of children: 3  . Years of education: 12th  . Highest education level: Not on  file  Occupational History  . Occupation: disabled    Comment: CNA  Social Needs  . Financial resource strain: Not on  file  . Food insecurity:    Worry: Not on file    Inability: Not on file  . Transportation needs:    Medical: Not on file    Non-medical: Not on file  Tobacco Use  . Smoking status: Never Smoker  . Smokeless tobacco: Never Used  Substance and Sexual Activity  . Alcohol use: No  . Drug use: No  . Sexual activity: Not Currently  Lifestyle  . Physical activity:    Days per week: Not on file    Minutes per session: Not on file  . Stress: Not on file  Relationships  . Social connections:    Talks on phone: Not on file    Gets together: Not on file    Attends religious service: Not on file    Active member of club or organization: Not on file    Attends meetings of clubs or organizations: Not on file    Relationship status: Not on file  . Intimate partner violence:    Fear of current or ex partner: Not on file    Emotionally abused: Not on file    Physically abused: Not on file    Forced sexual activity: Not on file  Other Topics Concern  . Not on file  Social History Narrative   Pt. Lives at home with her daughter. She is single, has three children, does not work currently, and has a 12th grade education level. She has never used tobacco or alcohol. Quit using illicit drugs at the age of 56 and rarely ha caffeine.     Family History  Problem Relation Age of Onset  . Heart attack Mother        @ age 94  . Mental illness Mother   . Diabetes Mother   . Hypertension Mother        siblings  . Alzheimer's disease Mother   . Depression Mother   . Hyperlipidemia Mother   . Heart attack Brother 50  . Alcohol abuse Brother   . Depression Brother   . Diabetes Brother   . Hyperlipidemia Brother   . Hypertension Brother   . Kidney disease Brother   . Drug abuse Brother   . Alcohol abuse Father   . Heart attack Father   . Hyperlipidemia Father   . Hypertension Father   . Colon cancer Maternal Aunt   . Prostate cancer Maternal Grandfather   . Diabetes Maternal Grandfather     . Hyperlipidemia Maternal Grandfather   . Ovarian cancer Maternal Aunt   . Diabetes Unknown   . Hypertension Unknown   . Lupus Sister   . Alcohol abuse Sister   . Depression Sister   . Diabetes Sister   . Hyperlipidemia Sister   . Hypertension Sister   . Kidney disease Sister   . Drug abuse Sister   . Ovarian cancer Cousin   . Diabetes Maternal Grandmother   . Hyperlipidemia Maternal Grandmother   . Breast cancer Neg Hx      Review of Systems: All other systems reviewed and are otherwise negative except as noted above.  Physical Exam: Vitals:   11/22/17 1930 11/22/17 2100 11/22/17 2204 11/23/17 0426  BP: (!) 172/70 (!) 164/70 (!) 180/73 (!) 154/82  Pulse: (!) 57 (!) 52 (!) 55 (!) 56  Resp: 17 15 18 18   Temp:  98.3 F (36.8 C) 97.9 F (36.6 C)  TempSrc:   Oral Oral  SpO2: 100% 98% 98% 94%  Weight:   217 lb 8 oz (98.7 kg) 216 lb 3.2 oz (98.1 kg)  Height:   5\' 7"  (1.702 m)     GEN- The patient is well appearing, alert and oriented x 3 today.   HEENT: normocephalic, atraumatic; sclera clear, conjunctiva pink; hearing intact; oropharynx clear; neck supple Lungs- Clear to ausculation bilaterally, normal work of breathing.  No wheezes, rales, rhonchi Heart- Regular rate and rhythm, no murmurs, rubs or gallops GI- soft, non-tender, non-distended, bowel sounds present Extremities- no clubbing, cyanosis, or edema; DP/PT/radial pulses 2+ bilaterally MS- no significant deformity or atrophy Skin- warm and dry, no rash or lesion Psych- euthymic mood, full affect Neuro- strength and sensation are intact  Labs:   Lab Results  Component Value Date   WBC 3.8 (L) 11/23/2017   HGB 11.8 (L) 11/23/2017   HCT 36.7 11/23/2017   MCV 90.8 11/23/2017   PLT 157 11/23/2017    Recent Labs  Lab 11/22/17 1650 11/23/17 0621  NA  --  140  K  --  4.1  CL  --  105  CO2  --  26  BUN  --  13  CREATININE  --  1.19*  CALCIUM  --  9.0  PROT 7.0  --   BILITOT 0.6  --   ALKPHOS 100  --    ALT 18  --   AST 25  --   GLUCOSE  --  142*      Radiology/Studies: Dg Chest 2 View  Result Date: 11/22/2017 CLINICAL DATA:  Patient underwent ablation for atrial fibrillation 6 days ago and has been having tremors ever since with pain in the back and extending into the right leg. Patient reports syncopal episodes at home. EXAM: CHEST - 2 VIEW COMPARISON:  Chest x-ray of May 05, 2017 FINDINGS: The lungs are adequately inflated and clear. The heart is mildly enlarged. The central pulmonary vascularity is prominent. There is no definite cephalization of the vascular pattern. The mediastinum is normal in width. There is no pleural effusion. The bony thorax exhibits no acute abnormality. IMPRESSION: Mild prominence of the cardiac silhouette and central pulmonary vascularity. No pulmonary edema or pneumonia. Electronically Signed   By: David  [place] M.D.   On: 11/22/2017 17:07   Ct Head Wo Contrast  Result Date: 11/22/2017 CLINICAL DATA:  Tremors EXAM: CT HEAD WITHOUT CONTRAST TECHNIQUE: Contiguous axial images were obtained from the base of the skull through the vertex without intravenous contrast. COMPARISON:  MRI brain 05/13/2017, CT brain 05/10/2016 FINDINGS: Brain: No evidence of acute infarction, hemorrhage, hydrocephalus, extra-axial collection or mass lesion/mass effect. Vascular: No hyperdense vessels. Scattered calcifications at the carotid siphons. Skull: Normal. Negative for fracture or focal lesion. Sinuses/Orbits: Mild mucosal thickening in the ethmoid sinuses. No acute orbital abnormality. Other: None IMPRESSION: 1. Negative.  No CT evidence for acute intracranial abnormality. Electronically Signed   By: Jasmine Pang M.D.   On: 11/22/2017 18:09   Ct Cardiac Morph/pulm Vein W/cm&w/o Ca Score  Addendum Date: 11/10/2017   ADDENDUM REPORT: 11/10/2017 13:12 CLINICAL DATA:  Atrial fibrillation scheduled for an ablation. EXAM: Cardiac CT/CTA TECHNIQUE: The patient was scanned on a [employer]  scanner. FINDINGS: A 120 kV prospective scan was triggered in the descending thoracic aorta at 111 HU's. Gantry rotation speed was 280 msecs and collimation was .9 mm. No beta blockade and no NTG  was given. The 3D data set was reconstructed in 5% intervals of the 60-80 % of the R-R cycle. Diastolic phases were analyzed on a dedicated work station using MPR, MIP and VRT modes. The patient received 80 cc of contrast. There was moderate biatrial enlargement. There was no ASD. There was no pericardial effusion. Aortic root was normal at 3.5 cm. There was a suggestion of LAA thrombus at tip of appendage on contrast images but delayed images were clear with no evidence of thrombus. The PV;s drained normally into the LA. The LLPV appeared stenotic with a very elliptical opening measuring less than 6 mm in minor axis plane. RUPV: Ostium 16.7 mm Area 2.14 cm2 RLPV: Ostium 13.1 mm Area 1.25 cm2 LUPV: Ostium 17.4 mm Area 1.71 cm2 LLPV: Ostium elliptical minor axis less than 6 mm post stenotic dilatation 15.9 mm Area .97 cm2 Coronary Arteries calcium noted in all 3 major epicardial vessels mostly LAD IMPRESSION: 1.  Moderate biatrial enlargement 2.  No LAA thrombus 3.  No ASD/PFO 4.  Normal PV anatomy but diminutive with stenosis of LLPV 5.  Normal aortic root 3.5 cm 6.  Esophagus courses closest to the LLPV ostium 7.  Calcium score 220 which is 92 nd percentile for age and sex Charlton Haws Electronically Signed   By: Charlton Haws M.D.   On: 11/10/2017 13:12   Result Date: 11/10/2017 EXAM: OVER-READ INTERPRETATION  CT CHEST The following report is an over-read performed by radiologist Dr. Trudie Reed of [facility] on 11/10/2017. This over-read does not include interpretation of cardiac or coronary anatomy or pathology. The coronary calcium score/coronary CTA interpretation by the cardiologist is attached. COMPARISON:  Chest CT 08/02/2014. FINDINGS: Numerous borderline enlarged and mildly enlarged  mediastinal and hilar lymph nodes, similar to the prior examination, largest of which measures 16 mm in the low right paratracheal nodal station. Within the visualized portions of the thorax there are no suspicious appearing pulmonary nodules or masses, there is no acute consolidative airspace disease, no pleural effusions and no pneumothorax. Visualized portions of the upper abdomen are unremarkable. There are no aggressive appearing lytic or blastic lesions noted in the visualized portions of the skeleton. IMPRESSION: 1. Mediastinal and bilateral hilar lymphadenopathy, similar to the prior examination, compatible with the patient's known history of sarcoidosis. Electronically Signed: By: Trudie Reed M.D. On: 11/10/2017 12:35    EKG: sinus bradycardia (personally reviewed)  TELEMETRY: sinus rhythm, afib episode noted, no pauses or profound brady events (personally reviewed)   Assessment/Plan: 1.  Altered mentation/ level of consciousness History is not consistent with abrupt short post termination pauses.  I have not seen significant brady events since her ablations.  I am not convinced that PPM would improve her symptoms presently.  Her history is more suggestive of seizure disorder/ metabolic events. I would therefore recommend medicine/ neurology assessment for her events. Will keep on telemetry while here. Will consider loop recorder vs 30 day monitor prior to discharge depending on what we find on telemetry while here.  If she does have profound brady events which correlate with her symptoms, we could reconsider PPM.  2. Paroxysmal atrial fibrillation She has had some expected ERAF post ablation. I think that it may be best to treat short term with amiodarone.  I will stop tikosyn and start amiodarone 200mg  BID x 4 weeks, then 200mg  daily Do not hold xarelto.  chads2vasc score is 4.  3. Sinus bradycardia As above Will not proceed with PPM unless we  document profound bradycardia as the  cause for her episodes Stop metoprolol  EP to follow with you while here  Signed, [name] MD 11/23/2017 8:18 AM

## 2017-11-23 NOTE — Procedures (Signed)
ELECTROENCEPHALOGRAM REPORT  Date of Study: 11/23/17  Patient's Name: Toni RosenthalLinda F Sciuto MRN: [id number] Date of Birth: 1950-11-23  Referring Provider: Freddrick MarchYashika Amin, MD  Clinical History: [name] is a 67 y.o.  presenting for evaluation after 4 episodes of LOC at home, each lasting about 3-5 minutes. The first 3 occurred on Sunday and the last on Monday. Her first spell was accompanied by fecal incontinence. The spells were all witnessed by her daughter. The patient states that her daughter told her that she was speaking gibberish, "shaking my arms and legs" and exclaiming during the spells; the patient has no memory of the events.  She has a history of seizures when she was 13, which were treated with phenobarbital. She states that the seizures stopped occurring and that subsequently phenobarbital was discontinued. She had no further spells during her lifetime until the possible seizure like episodes occurring early this week. Head CT (4/8) negative.    Medications: Scheduled Meds: . amiodarone  200 mg Oral BID  . amLODipine  5 mg Oral Daily  . atorvastatin  80 mg Oral q1800  . calcium carbonate  1 tablet Oral Q breakfast  . cholecalciferol  1,000 Units Oral BID  . DULoxetine  60 mg Oral QHS  . insulin aspart  0-15 Units Subcutaneous TID WC  . insulin aspart  0-5 Units Subcutaneous QHS  . insulin aspart  4 Units Subcutaneous TID WC  . insulin glargine  35 Units Subcutaneous QHS  . magnesium oxide  400 mg Oral BID  . mometasone-formoterol  2 puff Inhalation BID  . pantoprazole  40 mg Oral Daily  . pregabalin  100 mg Oral TID  . rivaroxaban  20 mg Oral Q supper  . sodium chloride flush  3 mL Intravenous Q12H  . sodium chloride flush  3 mL Intravenous Q12H  . tamsulosin  0.4 mg Oral QHS  . vitamin C  500 mg Oral BID   Continuous Infusions: . sodium chloride     PRN Meds:.sodium chloride, acetaminophen **OR** acetaminophen, albuterol, bisacodyl, hydrALAZINE, nitroGLYCERIN,  ondansetron **OR** ondansetron (ZOFRAN) IV, oxyCODONE-acetaminophen **AND** oxyCODONE, senna-docusate, sodium chloride flush            Technical Summary: This is a standard 16 channel EEG recording performed according to the international 10-20 electrode system.  AP bipolar, transverse bipolar, and referential montages were obtained, and digitally reformatted as necessary.  Duration of Tracing: 25:11  Description: In the awake state there is a 8 Hz alpha rhythm seen from the posterior head regions in a symmetric fashion.  Drowsiness is noted by dropout of background alpha and slowing and stage 2 sleep with sleep spindles and vertex sharp activity.   Photic stimulation was performed without notable changes. HV was not performed.  EKG was monitored and noted to be sinus arrhythmia with an average heart rate of 54 bpm.  Impression: This is a normal EEG in the awake, drowsy, and sleep states without any epileptiform abnormalities noted.   There were no noted spells during this tracing.  A single normal EEG does not exclude the diagnosis of epilepsy.  Clinical correlation advised.   Beryle Beamsobert Ebin Palazzi, M.D. Neurology Cell [phone]) [phone]

## 2017-11-23 NOTE — Progress Notes (Signed)
Pt went down to ECHO, got call from staff that pt was having chest pressure but was  On her way back to room. Upon returned, RN assess pt and asked about chest pressure, pt pointed to rib cage and rate it at a 10 on a 0-10 pain scale, MD was paged about new onset chest pressure. MD came up to see pt, pt reported she was having CP, order to give nitro and stat EKG. One nitro given at 1244 with a B/P of 137/91 and CP of 8, B/P drop drown to 119/67 after one nitro and rate pain at 6, MD was present and told RN not to give any more nitro due to fast decrease in B/P, continue to monitor pt.

## 2017-11-23 NOTE — Progress Notes (Signed)
  Echocardiogram 2D Echocardiogram has been performed.  Delcie RochENNINGTON, Marcayla Budge 11/23/2017, 12:10 PM

## 2017-11-23 NOTE — Progress Notes (Signed)
Family Medicine Teaching Service Daily Progress Note Intern Pager: [phone]  Patient name: [name] Medical record number: [id number] Date of birth: 20-Sep-1950 Age: 67 y.o. Gender: female  Primary Care Provider: [name], MD Consultants: Electrophysiology Code Status: Full  Pt Overview and Major Events to Date:  [name] is a 67 y.o. female who presents with 4 episodes of loss of consciousness over the past week.  Past medical history includes paroxysmal atrial fibrillation on Xarelto and status post ablation last week, chronic back pain, hypertension, diabetes type 2, depression  Assessment and Plan:  Episodes of loss of consciousness 4X episodes over the past week.  Reports it has been occurring both while walking and at rest.  Patient reports loss of consciousness lasting approximately 5 minutes.  She is unaware if she had a postictal state after these events.  She also endorsed mixed incontinence/defecation during to these events.  Off note, patient has multiple ED and outpatient notes for syncopy work up and seizure. Recent had cardiac abblation for atrial fibrillation last week.  She has been evaluated by EP this admission who does not feel that story and symptoms are consistent with syncope related to atrial fibrillation.  Workup thus far has been negative for evidence of focal findings for altered mental status or loss consciousness including negative infectious workup, normal head CT, normal explained.  Patient does take opioids for chronic pain, UDS positive for opioids nothing else.  Differential is syncope due to cardiac arrhythmia versus seizure.  Also possible patient may be taking home opioids inappropriately leading to loss of conscious. Given history of seizures, will obtain EEG and consult neurology for further recommendations. -Monitor on telemetry -Appreciate letter physiology recommendations -Neurology consulted, appreciate  recommendations -EEG -Ammonia  Atrial fibrillation with RVR Initially admitted with bradycardia, patient went into atrial for ablation with RVR today with heart rate 120s.  Status post ablation last week followed by Dr. Johney Frame with EP.  Metoprolol was held in setting of bradycardia on admission, rhythm control with amiodarone 200 mg twice daily.  CHADS-Vasc2 4.  Anticoagulated on Xarelto 20 mg.  Chest x-ray showed mild bibasilar atelectasis versus scarring, no acute cardiopulmonary disease. -Telemetry -Hold metoprolol per EP recommendations, consider adding if has sustained heart rate greater than 140-150 -EP recommendations -Continue Xarelto -Trend troponins  Chronic back pain Patient says that she has had chronic back pain due to to [facility] she is same several years ago.  Takes oxycodone 10-325 mg up to 5 times a day as needed.  -5 mg every 4 as needed -Continue Cymbalta  Hypertension Blood pressure has been 140s-160s/ 80-90s.  Patient takes metoprolol 50 mg twice daily at home.  However it was held in the setting of bradycardia on admission.  Will -start amlodipine 5 mg -Hydralazine 10 mg every 4 as needed BP greater than 175//90 -Monitor BP  Diabetes mellitus type 2 Last A1c 10.1 on 08/2017.  On admit UA showed greater than 500.  CBGs over the past on 4 hours of been 180s-250s.  At home patient takes 50 units Lantus nightly, Humalog sliding scale 18-32 units -Moderate sliding scale insulin +4 units 3 times daily with meals -Lantus 35 units  Bipolar type I versus depression/anxiety Patient on Cymbalta 60 mg daily for mood and chronic pain. -Continue Cymbalta  Asthma Uses albuterol inhaler prn -home albuterol  GERD Takes Protonix at home -Continue Protonix  History of seizures She had what was documented as a seizure starting back in 2015.  Has followed with neurology, last seen 05, 2018.  In care everywhere patient [facility] results are consistent  with not epileptic events.  Since then she has been off of lamotrigine as antiepileptic.  Other syncopal-like events are attributed to atrial fibrillation versus polysubstance use with chronic comorbidities. -Appreciate neurology recognitions  Sarcoidosis -History of sarcoidosis.  Not on any immunosuppressive medications or steroids.  Chest x-ray did not show any nodules. -Monitor respiratory  CKD 3 Creatinine 1.19 on admit.  Baseline 1.2. -Monitor creatinine with daily BMP  Urinary retention? Patient takes Flomax daily 0.4 mg.  Does not endorse any symptoms of dysuria hematuria, increased frequency on exam.  Endorse some incontinence associated with loss of consciousness spells.  Orthostatics normal this admission -Continue tamsulosin -Monitor I's and O's -Follow-up with patient while she is taking this, may be leading to orthostatic hypotension causing symptoms  FEN/GI: Heart healthy diet PPx: Anticoagulation with Xarelto  Disposition: Inpatient  Subjective:  Patient is just returned back from getting echocardiogram.  She was experiencing chest pain.  Please see interim progress note written earlier  Objective: Temp:  [97.7 F (36.5 C)-98.3 F (36.8 C)] 98.2 F (36.8 C) (04/09 1214) Pulse Rate:  [52-120] 70 (04/09 1249) Resp:  [7-20] 20 (04/09 1214) BP: (119-180)/(42-92) 151/83 (04/09 1349) SpO2:  [94 %-100 %] 98 % (04/09 1349) Weight:  [216 lb 3.2 oz (98.1 kg)-217 lb 8 oz (98.7 kg)] 216 lb 3.2 oz (98.1 kg) (04/09 0426) Physical Exam: Gen: Listless, Sitting up on bedside commode  CV: tachycardic, irregular with no murmurs appreciated Pulm: NWOB, CTAB with no crackles, wheezes, or rhonchi GI: Normal bowel sounds present. Soft, Nontender, Nondistended. MSK: 2+ pitting LE edema, cyanosis, or clubbing noted Skin: warm, dry Neuro: grossly normal, moves all extremities Psych: Normal affect and thought content  Laboratory: Recent Labs  Lab 11/22/17 1245 11/23/17 0621  WBC  4.0 3.8*  HGB 11.9* 11.8*  HCT 37.5 36.7  PLT 168 157   Recent Labs  Lab 11/17/17 0519 11/22/17 1245 11/22/17 1650 11/23/17 0621  NA 134* 137  --  140  K 4.5 4.8  --  4.1  CL 102 104  --  105  CO2 21* 21*  --  26  BUN 11 15  --  13  CREATININE 1.27* 1.32*  --  1.19*  CALCIUM 8.3* 9.2  --  9.0  PROT  --   --  7.0  --   BILITOT  --   --  0.6  --   ALKPHOS  --   --  100  --   ALT  --   --  18  --   AST  --   --  25  --   GLUCOSE 210* 337*  --  142*    Imaging/Diagnostic Tests: Dg Chest 2 View  Result Date: 11/22/2017 CLINICAL DATA:  Patient underwent ablation for atrial fibrillation 6 days ago and has been having tremors ever since with pain in the back and extending into the right leg. Patient reports syncopal episodes at home. EXAM: CHEST - 2 VIEW COMPARISON:  Chest x-ray of May 05, 2017 FINDINGS: The lungs are adequately inflated and clear. The heart is mildly enlarged. The central pulmonary vascularity is prominent. There is no definite cephalization of the vascular pattern. The mediastinum is normal in width. There is no pleural effusion. The bony thorax exhibits no acute abnormality. IMPRESSION: Mild prominence of the cardiac silhouette and central pulmonary vascularity. No  pulmonary edema or pneumonia. Electronically Signed   By: David  [place] M.D.   On: 11/22/2017 17:07   Ct Head Wo Contrast  Result Date: 11/22/2017 CLINICAL DATA:  Tremors EXAM: CT HEAD WITHOUT CONTRAST TECHNIQUE: Contiguous axial images were obtained from the base of the skull through the vertex without intravenous contrast. COMPARISON:  MRI brain 05/13/2017, CT brain 05/10/2016 FINDINGS: Brain: No evidence of acute infarction, hemorrhage, hydrocephalus, extra-axial collection or mass lesion/mass effect. Vascular: No hyperdense vessels. Scattered calcifications at the carotid siphons. Skull: Normal. Negative for fracture or focal lesion. Sinuses/Orbits: Mild mucosal thickening in the ethmoid sinuses. No  acute orbital abnormality. Other: None IMPRESSION: 1. Negative.  No CT evidence for acute intracranial abnormality. Electronically Signed   By: Jasmine Pang M.D.   On: 11/22/2017 18:09    Garnette Gunner, MD 11/23/2017, 1:56 PM PGY-1, [facility] Family Medicine FPTS Intern pager: [phone], text pages welcome

## 2017-11-23 NOTE — Progress Notes (Signed)
S: Called to bedside by nurse for patient having sudden onset of chest pressure.  She experiences while down getting her echocardiogram.  Upon return to the room she was also endorsing new onset nausea and feeling that she needed to have a bowel movement but was unable to.    Patient is endorsing midsternal chest pressure that does not radiate.  She has also endorsing shortness of breath.    O: Vital signs were tachycardia 120s, blood pressure 160/80. On physical exam patient is listless, mildly tremulous bilaterally in her hands, is tachycardic, no murmurs rubs or gallops, is lungs were clear, abdomen was nontender nondistended, no lower extremity edema.    A/P: Stat EKG showed atrial fibrillation with RVR no ST elevation.  Given patient's history of recent atrial fibrillation ablation likely patient went into A. fib due to incomplete rate/rhythm control with amiodarone.  Pain did improve with sublingual nitro. However, will obtain troponins and chest x-ray.  Spoke with EP A AP Renee, EP feels that this is likely the patient has incomplete rhythm control and is expected after recent ablation.  She does not recommend starting metoprolol at this time.  EP said they will go and reevaluate patient this afternoon.  We will continue to monitor patient on telemetry.

## 2017-11-23 NOTE — Progress Notes (Signed)
EEG complete - results pending 

## 2017-11-23 NOTE — Progress Notes (Signed)
Lab called in critical troponin of 0.03, MD paged with results.  CCMD called pt HR drop to 40's, MD aware.

## 2017-11-24 ENCOUNTER — Ambulatory Visit (HOSPITAL_COMMUNITY): Payer: Medicare Other

## 2017-11-24 DIAGNOSIS — I4891 Unspecified atrial fibrillation: Secondary | ICD-10-CM

## 2017-11-24 DIAGNOSIS — R0782 Intercostal pain: Secondary | ICD-10-CM

## 2017-11-24 DIAGNOSIS — F319 Bipolar disorder, unspecified: Secondary | ICD-10-CM

## 2017-11-24 LAB — GLUCOSE, CAPILLARY
Glucose-Capillary: 203 mg/dL — ABNORMAL HIGH (ref 65–99)
Glucose-Capillary: 199 mg/dL — ABNORMAL HIGH (ref 65–99)
Glucose-Capillary: 260 mg/dL — ABNORMAL HIGH (ref 65–99)
Glucose-Capillary: 188 mg/dL — ABNORMAL HIGH (ref 65–99)
Glucose-Capillary: 196 mg/dL — ABNORMAL HIGH (ref 65–99)

## 2017-11-24 LAB — BASIC METABOLIC PANEL
Anion gap: 14 (ref 5–15)
BUN: 15 mg/dL (ref 6–20)
CHLORIDE: 100 mmol/L — AB (ref 101–111)
CO2: 24 mmol/L (ref 22–32)
CREATININE: 1.4 mg/dL — AB (ref 0.44–1.00)
Calcium: 9.3 mg/dL (ref 8.9–10.3)
GFR calc Af Amer: 45 mL/min — ABNORMAL LOW (ref >60)
GFR calc non Af Amer: 38 mL/min — ABNORMAL LOW (ref >60)
GLUCOSE: 229 mg/dL — AB (ref 65–99)
Potassium: 4 mmol/L (ref 3.5–5.1)
Sodium: 138 mmol/L (ref 135–145)

## 2017-11-24 LAB — CBC
HCT: 36.9 % (ref 36.0–46.0)
HEMOGLOBIN: 11.8 g/dL — AB (ref 12.0–15.0)
MCH: 28.8 pg (ref 26.0–34.0)
MCHC: 32 g/dL (ref 30.0–36.0)
MCV: 90 fL (ref 78.0–100.0)
Platelets: 182 10*3/uL (ref 150–400)
RBC: 4.1 MIL/uL (ref 3.87–5.11)
RDW: 14.1 % (ref 11.5–15.5)
WBC: 4.6 10*3/uL (ref 4.0–10.5)

## 2017-11-24 LAB — TSH: TSH: 1.379 u[IU]/mL (ref 0.350–4.500)

## 2017-11-24 LAB — TROPONIN I: Troponin I: 0.04 ng/mL (ref <0.03)

## 2017-11-24 MED ORDER — LORAZEPAM 1 MG PO TABS: 1.0000 mg | ORAL_TABLET | Freq: Once | ORAL | Status: DC

## 2017-11-24 MED ORDER — GI COCKTAIL ~~LOC~~
30.0000 mL | Freq: Once | ORAL | Status: AC
  Administered 2017-11-24: 30 mL via ORAL
  Filled 2017-11-24: qty 30

## 2017-11-24 MED ORDER — INSULIN GLARGINE 100 UNIT/ML ~~LOC~~ SOLN
40.0000 [IU] | Freq: Every day | SUBCUTANEOUS | Status: DC
  Administered 2017-11-24: 40 [IU] via SUBCUTANEOUS
  Filled 2017-11-24 (×2): qty 0.4

## 2017-11-24 NOTE — Evaluation (Signed)
Occupational Therapy Evaluation Patient Details Name: [name] MRN: [id number] DOB: 20-Jul-1951 Today's Date: 11/24/2017    History of Present Illness Pt adm with reports of 4 episodes of LOC. Cardiac and neuro work-up negative. PMH - seizure disorder, HTN, bipolar, DM, afib   Clinical Impression   PTA, pt reports that she was able to ambulate with cane and required assistance for tub transfers and supervision for showering. Otherwise, pt is able to complete basic ADL without assistance. She reports multiple falls at home lately.Pt with fluctuating dizziness and back pain throughout session and requiring min guard assist for UB and LB ADL as well as min assist for ambulating toilet transfers with cane. Pt would benefit from continued OT services while admitted to improve independence and safety with ADL and functional mobility prior to returning home. She will need 24 hour assistance from her daughter post-acute D/C.    Follow Up Recommendations  Supervision/Assistance - 24 hour;No OT follow up    Equipment Recommendations  None recommended by OT    Recommendations for Other Services       Precautions / Restrictions Precautions Precautions: Fall Restrictions Weight Bearing Restrictions: No      Mobility Bed Mobility Overal bed mobility: Needs Assistance Bed Mobility: Rolling;Sit to Sidelying Rolling: Min guard       Sit to sidelying: Min guard General bed mobility comments: increased time and cues for log roll technique due to back pain  Transfers Overall transfer level: Needs assistance Equipment used: Rolling walker (2 wheeled) Transfers: Sit to/from Stand Sit to Stand: Min guard         General transfer comment: Min guard assist for stability    Balance Overall balance assessment: Needs assistance Sitting-balance support: No upper extremity supported;Feet supported Sitting balance-Leahy Scale: Normal     Standing balance support: Single extremity  supported Standing balance-Leahy Scale: Poor Standing balance comment: Using straight cane and holding on to furniture at times.                            ADL either performed or assessed with clinical judgement   ADL Overall ADL's : Needs assistance/impaired Eating/Feeding: Set up;Sitting   Grooming: Set up;Sitting   Upper Body Bathing: Min guard;Sitting   Lower Body Bathing: Min guard;Sitting/lateral leans   Upper Body Dressing : Min guard;Sitting   Lower Body Dressing: Min guard;Sitting/lateral leans   Toilet Transfer: Minimal assistance;Ambulation(with straight cane)   Toileting- Clothing Manipulation and Hygiene: Min guard;Sitting/lateral lean       Functional mobility during ADLs: Minimal assistance;Cane General ADL Comments: Pt with fluctuating back pain as well as waxing and waning symptoms (lightheadedness) during session.      Vision Baseline Vision/History: Wears glasses;Glaucoma;Cataracts("early" glaucoma and cataracts) Wears Glasses: Reading only Patient Visual Report: No change from baseline Vision Assessment?: No apparent visual deficits     Perception     Praxis      Pertinent Vitals/Pain Pain Assessment: 0-10 Pain Score: 7  Faces Pain Scale: Hurts little more(no signs until I asked about pain) Pain Location: back Pain Descriptors / Indicators: Grimacing Pain Intervention(s): Limited activity within patient's tolerance;Monitored during session;Repositioned     Hand Dominance Right   Extremity/Trunk Assessment Upper Extremity Assessment Upper Extremity Assessment: Overall WFL for tasks assessed   Lower Extremity Assessment Lower Extremity Assessment: Overall WFL for tasks assessed       Communication Communication Communication: No difficulties   Cognition Arousal/Alertness: Awake/alert Behavior During  Therapy: WFL for tasks assessed/performed Overall Cognitive Status: Within Functional Limits for tasks assessed                                      General Comments       Exercises     Shoulder Instructions      Home Living Family/patient expects to be discharged to:: Private residence Living Arrangements: Children(daughter and grandchildren) Available Help at Discharge: Family;Available 24 hours/day Type of Home: House Home Access: Level entry     Home Layout: One level     Bathroom Shower/Tub: Chief Strategy OfficerTub/shower unit   Bathroom Toilet: Standard     Home Equipment: Cane - quad;Shower seat;Bedside commode   Additional Comments: Reports daughter and grandchildren assist with ADL and "get me up when I fall."      Prior Functioning/Environment Level of Independence: Needs assistance  Gait / Transfers Assistance Needed: Amb with quad cane ADL's / Homemaking Assistance Needed: Daughter and grandchildren assist with showering (transfers and supervise bathing).            OT Problem List: Decreased strength;Decreased range of motion;Decreased activity tolerance;Impaired balance (sitting and/or standing);Decreased safety awareness;Decreased knowledge of use of DME or AE;Decreased knowledge of precautions;Cardiopulmonary status limiting activity      OT Treatment/Interventions:      OT Goals(Current goals can be found in the care plan section) Acute Rehab OT Goals Patient Stated Goal: figure out what is going on OT Goal Formulation: With patient Time For Goal Achievement: 12/08/17 Potential to Achieve Goals: Good ADL Goals Pt Will Perform Grooming: with modified independence;standing Pt Will Perform Lower Body Dressing: with modified independence;sit to/from stand Pt Will Transfer to Toilet: with modified independence;ambulating;regular height toilet Pt Will Perform Toileting - Clothing Manipulation and hygiene: with modified independence;sit to/from stand Pt Will Perform Tub/Shower Transfer: with modified independence;Tub transfer;rolling walker;shower seat  OT Frequency:      Barriers to D/C:            Co-evaluation              AM-PAC PT "6 Clicks" Daily Activity     Outcome Measure Help from another person eating meals?: None Help from another person taking care of personal grooming?: A Little Help from another person toileting, which includes using toliet, bedpan, or urinal?: A Little Help from another person bathing (including washing, rinsing, drying)?: A Little Help from another person to put on and taking off regular upper body clothing?: A Little Help from another person to put on and taking off regular lower body clothing?: A Little 6 Click Score: 19   End of Session Equipment Utilized During Treatment: Gait belt;Rolling walker Nurse Communication: Mobility status  Activity Tolerance: Patient tolerated treatment well Patient left: in bed;with call bell/phone within reach;with bed alarm set  OT Visit Diagnosis: Other abnormalities of gait and mobility (R26.89);Muscle weakness (generalized) (M62.81)                Time: 1104-1140 OT Time Calculation (min): 36 min Charges:  OT General Charges $OT Visit: 1 Visit OT Evaluation $OT Eval Moderate Complexity: 1 Mod OT Treatments $Self Care/Home Management : 8-22 mins G-Codes:     Doristine Sectionharity A Khang Hannum, MS OTR/L  Pager: [phone]   Sameerah Nachtigal A Thatiana Renbarger 11/24/2017, 1:01 PM

## 2017-11-24 NOTE — Progress Notes (Signed)
EEG was normal. MRI brain is pending.   Electronically signed: Dr. Caryl PinaEric Janeil Schexnayder

## 2017-11-24 NOTE — Evaluation (Signed)
Physical Therapy Evaluation Patient Details Name: [name] MRN: [id number] DOB: [birth year] Today's Date: 11/24/2017   History of Present Illness  Pt adm with reports of 4 episodes of LOC. Cardiac and neuro work-up negative. PMH - seizure disorder, HTN, bipolar, DM, afib  Clinical Impression  Pt presents to PT with inconsistent gait pattern. Will follow and recommend home with f/u at [facility].    Follow Up Recommendations Outpatient PT    Equipment Recommendations  Rolling walker with 5" wheels    Recommendations for Other Services       Precautions / Restrictions Precautions Precautions: Fall Restrictions Weight Bearing Restrictions: No      Mobility  Bed Mobility Overal bed mobility: Modified Independent             General bed mobility comments: Incr time  Transfers Overall transfer level: Needs assistance Equipment used: Rolling walker (2 wheeled) Transfers: Sit to/from Stand Sit to Stand: Supervision            Ambulation/Gait Ambulation/Gait assistance: Min guard Ambulation Distance (Feet): 125 Feet Assistive device: Rolling walker (2 wheeled) Gait Pattern/deviations: Step-through pattern;Decreased stride length;Drifts right/left Gait velocity: decr Gait velocity interpretation: Below normal speed for age/gender General Gait Details: Pt with initial 3-4 steps with smooth movements and no signs of difficulty. After that pt with hesitant movements that were inconsistent in nature. Kept my hand on pt for safety but no physical assist needed.  Stairs            Wheelchair Mobility    Modified Rankin (Stroke Patients Only)       Balance Overall balance assessment: Needs assistance Sitting-balance support: No upper extremity supported;Feet supported Sitting balance-Leahy Scale: Normal     Standing balance support: Single extremity supported Standing balance-Leahy Scale: Poor Standing balance comment: Pt felt she needed to hold onto  something but seemed stable enough to stand unsupported.                              Pertinent Vitals/Pain Pain Assessment: Faces Faces Pain Scale: Hurts little more(no signs until I asked about pain) Pain Location: back Pain Descriptors / Indicators: Grimacing Pain Intervention(s): Repositioned    Home Living Family/patient expects to be discharged to:: Private residence Living Arrangements: Children(daughter) Available Help at Discharge: Family;Available 24 hours/day Type of Home: House Home Access: Level entry     Home Layout: One level Home Equipment: Cane - quad;Shower seat;Bedside commode      Prior Function Level of Independence: Needs assistance   Gait / Transfers Assistance Needed: Amb with quad cane           Hand Dominance   Dominant Hand: Right    Extremity/Trunk Assessment   Upper Extremity Assessment Upper Extremity Assessment: Defer to OT evaluation    Lower Extremity Assessment Lower Extremity Assessment: Overall WFL for tasks assessed       Communication   Communication: No difficulties  Cognition Arousal/Alertness: Awake/alert Behavior During Therapy: WFL for tasks assessed/performed Overall Cognitive Status: Within Functional Limits for tasks assessed                                        General Comments      Exercises     Assessment/Plan    PT Assessment Patient needs continued PT services  PT Problem List Decreased balance;Decreased mobility  PT Treatment Interventions DME instruction;Gait training;Functional mobility training;Therapeutic activities;Therapeutic exercise;Balance training;Patient/family education    PT Goals (Current goals can be found in the Care Plan section)  Acute Rehab PT Goals Patient Stated Goal: not stated PT Goal Formulation: With patient Time For Goal Achievement: 12/01/17 Potential to Achieve Goals: Good    Frequency Min 3X/week   Barriers to discharge         Co-evaluation               AM-PAC PT "6 Clicks" Daily Activity  Outcome Measure Difficulty turning over in bed (including adjusting bedclothes, sheets and blankets)?: None Difficulty moving from lying on back to sitting on the side of the bed? : A Little Difficulty sitting down on and standing up from a chair with arms (e.g., wheelchair, bedside commode, etc,.)?: A Little Help needed moving to and from a bed to chair (including a wheelchair)?: A Little Help needed walking in hospital room?: A Little Help needed climbing 3-5 steps with a railing? : A Lot 6 Click Score: 18    End of Session Equipment Utilized During Treatment: Gait belt Activity Tolerance: Patient tolerated treatment well Patient left: in chair;with call bell/phone within reach;with chair alarm set Nurse Communication: Mobility status PT Visit Diagnosis: Unsteadiness on feet (R26.81)    Time: 2952-84130901-0915 PT Time Calculation (min) (ACUTE ONLY): 14 min   Charges:   PT Evaluation $PT Eval Low Complexity: 1 Low     PT G CodesMarland Kitchen:        [facility] P[facility] Parson PT [phone]   Angelina OkCary [facility] 11/24/2017, 11:15 AM

## 2017-11-24 NOTE — Progress Notes (Signed)
Family Medicine Teaching Service Daily Progress Note Intern Pager: [phone]  Patient name: [name] Medical record number: [id number] Date of birth: [birth year] Age: 67 y.o. Gender: female  Primary Care Provider: [name], MD Consultants: Electrophysiology Code Status: Full  Pt Overview and Major Events to Date:  [name] is a 67 y.o. female who presents with 4 episodes of loss of consciousness over the past week.  Past medical history includes paroxysmal atrial fibrillation on Xarelto and status post ablation last week, chronic back pain, hypertension, diabetes type 2, depression  Assessment and Plan:  Episodes of loss of consciousness 4X episodes of LOC in past week with multiple episodes in past. Ddx Cardiogenic induced syncope, seizure, vs. Iatrogenic (sedating effects of drugs.) Work up so far has been negative for metabolic, electrolyte abnormalities. EP consult does not believe this is cardiogenic. Neurology consult thus far for seizure has normal EEG, MRI pending. Outpatient neurology work up from 12/2016 indicates not likely primarily neurogenic in source. Patient does take multiple sedating medications including oxycodone and Lyrica confirmed in [place] Controlled Substance [place]. Does not seem to be using inappropriately. Ammonia mildly elevated, but does not correlate with elevated LFTs and clinically well appearing and oriented. Sx likely hepatic/metabolic in nature.  -Telemetry -Appreciate electrophysiology recommendations -Neurology consulted, appreciate recommendations  Chest pain, improved Still persist, is reproducable. CXR normal, troponins mildly elevated at 0.03>0.03>0.04. EKG did not show any ST changes. Echo showed normal EF. Likely MSK. - cont to monitor  Atrial fibrillation Currently rate controlled. Rhythm control with amiodarone 200 mg twice daily.  CHADS-Vasc2 4.  Anticoagulated on Xarelto 20 mg.  -Telemetry -Hold metoprolol per EP  recommendations,  -EP recommendations -Continue Xarelto  History of seizures She had what was documented as a seizure starting back in 2015.  Has followed with neurology, last seen 05, 2018.  In care everywhere patient has a note from [facility] with EEG results are consistent with not epileptic events.  Since then she has been off of lamotrigine as antiepileptic.  EEG normal this admission, see above for more information. -Appreciate neurology recognitions  Chronic back pain Patient says that she has had chronic back pain due to to [facility] she is same several years ago.  Takes oxycodone 10-325 mg up to 5 times a day as needed and Lyrica 100 mg TID.  -oxycodone 5 mg every 4 as needed, home lyrica -Continue Cymbalta  Hypertension, Improved. 114/67.  Patient takes metoprolol 50 mg twice daily at home, owever it was held in the setting of bradycardia on admission.  - cont amlodipine 5 mg -Hydralazine 10 mg every 4 as needed BP greater than 175//90 -Monitor BP  Diabetes mellitus type 2 A1C 9.3 on admit. CBGs over the past on 4 hours of been 180s-200s.  At home patient takes 50 units Lantus nightly, Humalog sliding scale 18-32 units -Moderate sliding scale insulin +4 units 3 times daily with meals -increase Lantus 40 units  Bipolar type I versus depression/anxiety Patient on Cymbalta 60 mg daily for mood and chronic pain.SSRI not usually indicated in Bipolar as may precipitate mania. Will monitor mood. -Continue Cymbalta -Consider changing agents in outpatient  Asthma, stable Home meds Dulera,  albuterol inhaler prn.  -cont home meds  GERD Takes Protonix at home -Continue Protonix  Sarcoidosis -History of sarcoidosis.  Not on any immunosuppressive medications or steroids.  Chest x-ray did not show any nodules. -Monitor respiratory status  CKD 3 Creatinine 1.19> 1.4   Baseline 1.2. -Monitor  creatinine with daily BMP  Urinary retention? Patient takes Flomax daily 0.4 mg.  Does not  endorse any symptoms of dysuria hematuria, increased frequency on exam.  Endorse some incontinence associated with loss of consciousness spells.  Orthostatics normal this admission. Robust UOP past 24 hr.  -Continue tamsulosin -Monitor I's and O's -Follow-up with patient while she is taking this, may be leading to orthostatic hypotension causing symptoms  FEN/GI: Heart healthy diet PPx: Anticoagulation with Xarelto  Disposition: Inpatient  Subjective:  Patient still endorsing chest pressure. Denies SOB, dizziness, HA. When asking her about how she uses her pain medication, she got defensive. She said that she uses them for her back pain only. She has been taking them for 5 years w/o issue. She is concerned that if they were stopped her back pain would worsen.   Objective: Temp:  [97.7 F (36.5 C)-98.4 F (36.9 C)] 98.2 F (36.8 C) (04/10 0610) Pulse Rate:  [63-120] 65 (04/10 0610) Resp:  [18-20] 18 (04/10 0610) BP: (114-168)/(54-92) 114/67 (04/10 0610) SpO2:  [95 %-99 %] 96 % (04/10 0610) Weight:  [215 lb 1.6 oz (97.6 kg)] 215 lb 1.6 oz (97.6 kg) (04/10 0610) Physical Exam: Gen: NAD, lying in bed HEENT: EOMI, PERRLA CV: tachycardic, irregular with no murmurs appreciated Pulm: NWOB, CTAB with no crackles, wheezes, or rhonchi GI: Normal bowel sounds present. Soft, Nontender, Nondistended. MSK: 2+ pitting LE edema, 5/5 strength in upper and lower extremities, cyanosis, or clubbing noted Skin: warm, dry Neuro: grossly normal, moves all extremities Psych: Normal affect and thought content  Laboratory: Recent Labs  Lab 11/22/17 1245 11/23/17 0621 11/24/17 0159  WBC 4.0 3.8* 4.6  HGB 11.9* 11.8* 11.8*  HCT 37.5 36.7 36.9  PLT 168 157 182   Recent Labs  Lab 11/22/17 1245 11/22/17 1650 11/23/17 0621 11/24/17 0159  NA 137  --  140 138  K 4.8  --  4.1 4.0  CL 104  --  105 100*  CO2 21*  --  26 24  BUN 15  --  13 15  CREATININE 1.32*  --  1.19* 1.40*  CALCIUM 9.2  --  9.0  9.3  PROT  --  7.0  --   --   BILITOT  --  0.6  --   --   ALKPHOS  --  100  --   --   ALT  --  18  --   --   AST  --  25  --   --   GLUCOSE 337*  --  142* 229*   [name], Purvi Ruehl B, MD 11/24/2017, 8:35 AM PGY-1, [facility] FPTS Intern pager: [phone], text pages welcome

## 2017-11-24 NOTE — Progress Notes (Addendum)
Progress Note  Patient Name: [name] Date of Encounter: 11/24/2017  Primary Cardiologist: Dr. [name] Electrophysiologist: Dr. Johney Frame  Subjective   Denies any particular symptom today, very vague, " I don't know what's wrong with me".  Mentions muscle spasms in her upper abdomen/chest intermittently, no SOB, no "spells" yesterday  Inpatient Medications    Scheduled Meds: . amiodarone  200 mg Oral BID  . amLODipine  5 mg Oral Daily  . atorvastatin  80 mg Oral q1800  . calcium carbonate  1 tablet Oral Q breakfast  . cholecalciferol  1,000 Units Oral BID  . DULoxetine  60 mg Oral QHS  . insulin aspart  0-15 Units Subcutaneous TID WC  . insulin aspart  0-5 Units Subcutaneous QHS  . insulin aspart  4 Units Subcutaneous TID WC  . insulin glargine  35 Units Subcutaneous QHS  . magnesium oxide  400 mg Oral BID  . mometasone-formoterol  2 puff Inhalation BID  . pantoprazole  40 mg Oral Daily  . pregabalin  100 mg Oral TID  . rivaroxaban  20 mg Oral Q supper  . sodium chloride flush  3 mL Intravenous Q12H  . sodium chloride flush  3 mL Intravenous Q12H  . tamsulosin  0.4 mg Oral QHS  . vitamin C  500 mg Oral BID   Continuous Infusions: . sodium chloride     PRN Meds: sodium chloride, acetaminophen **OR** acetaminophen, albuterol, bisacodyl, hydrALAZINE, nitroGLYCERIN, ondansetron **OR** ondansetron (ZOFRAN) IV, oxyCODONE-acetaminophen **AND** oxyCODONE, senna-docusate, sodium chloride flush   Vital Signs    Vitals:   11/23/17 1942 11/23/17 1957 11/24/17 0004 11/24/17 0610  BP: (!) 160/54  (!) 125/56 114/67  Pulse: 63  63 65  Resp: 18  18 18   Temp: 97.9 F (36.6 C)  98.4 F (36.9 C) 98.2 F (36.8 C)  TempSrc: Oral  Oral Oral  SpO2: 96% 95% 95% 96%  Weight:    215 lb 1.6 oz (97.6 kg)  Height:        Intake/Output Summary (Last 24 hours) at 11/24/2017 0717 Last data filed at 11/24/2017 0453 Gross per 24 hour  Intake 480 ml  Output 2900 ml  Net -2420 ml    Filed Weights   11/22/17 2204 11/23/17 0426 11/24/17 0610  Weight: 217 lb 8 oz (98.7 kg) 216 lb 3.2 oz (98.1 kg) 215 lb 1.6 oz (97.6 kg)    Telemetry    SR/SB, 50's-80's, transient rates 40's,  - Personally Reviewed  ECG    No new EKGs- Personally Reviewed  Physical Exam   GEN: No acute distress.   Neck: No JVD Cardiac: RRR, no murmurs, rubs, or gallops.  Respiratory: CTA b/l. GI: Soft, non-distended MS: No edema; No deformity, + chest wall tenderness Neuro:  Nonfocal  Psych: Normal affect   Labs    Chemistry Recent Labs  Lab 11/22/17 1245 11/22/17 1650 11/23/17 0621 11/24/17 0159  NA 137  --  140 138  K 4.8  --  4.1 4.0  CL 104  --  105 100*  CO2 21*  --  26 24  GLUCOSE 337*  --  142* 229*  BUN 15  --  13 15  CREATININE 1.32*  --  1.19* 1.40*  CALCIUM 9.2  --  9.0 9.3  PROT  --  7.0  --   --   ALBUMIN  --  3.6  --   --   AST  --  25  --   --  ALT  --  18  --   --   ALKPHOS  --  100  --   --   BILITOT  --  0.6  --   --   GFRNONAA 41*  --  47* 38*  GFRAA 48*  --  54* 45*  ANIONGAP 12  --  9 14     Hematology Recent Labs  Lab 11/22/17 1245 11/23/17 0621 11/24/17 0159  WBC 4.0 3.8* 4.6  RBC 4.14 4.04 4.10  HGB 11.9* 11.8* 11.8*  HCT 37.5 36.7 36.9  MCV 90.6 90.8 90.0  MCH 28.7 29.2 28.8  MCHC 31.7 32.2 32.0  RDW 14.2 14.3 14.1  PLT 168 157 182    Cardiac Enzymes Recent Labs  Lab 11/23/17 1249 11/23/17 1809 11/24/17 0019  TROPONINI 0.03* 0.04* 0.04*    Recent Labs  Lab 11/22/17 1656  TROPIPOC 0.02     BNPNo results for input(s): BNP, PROBNP in the last 168 hours.   DDimer No results for input(s): DDIMER in the last 168 hours.   Radiology    Dg Chest 2 View Result Date: 11/23/2017 CLINICAL DATA:  Chest pain. EXAM: CHEST - 2 VIEW COMPARISON:  11/22/2017. FINDINGS: Mediastinum hilar structures normal. Mild left base subsegmental atelectasis and/or scarring. No pleural effusion or pneumothorax. Heart size normal. Cervicothoracic  spine effusion. IMPRESSION: Mild left base subsegmental atelectasis and/or scarring. Electronically Signed   By: Maisie Fushomas  Register   On: 11/23/2017 14:24    Ct Head Wo Contrast Result Date: 11/22/2017 CLINICAL DATA:  Tremors EXAM: CT HEAD WITHOUT CONTRAST TECHNIQUE: Contiguous axial images were obtained from the base of the skull through the vertex without intravenous contrast. COMPARISON:  MRI brain 05/13/2017, CT brain 05/10/2016 FINDINGS: Brain: No evidence of acute infarction, hemorrhage, hydrocephalus, extra-axial collection or mass lesion/mass effect. Vascular: No hyperdense vessels. Scattered calcifications at the carotid siphons. Skull: Normal. Negative for fracture or focal lesion. Sinuses/Orbits: Mild mucosal thickening in the ethmoid sinuses. No acute orbital abnormality. Other: None IMPRESSION: 1. Negative.  No CT evidence for acute intracranial abnormality. Electronically Signed   By: Jasmine PangKim  Fujinaga M.D.   On: 11/22/2017 18:09    Cardiac Studies   11/23/17: TTE Study Conclusions - Left ventricle: The cavity size was normal. Wall thickness was   normal. Systolic function was normal. The estimated ejection   fraction was in the range of 60% to 65%. Wall motion was normal;   there were no regional wall motion abnormalities. The study is   not technically sufficient to allow evaluation of LV diastolic   function. - Mitral valve: Mildly thickened leaflets . There was trivial   regurgitation. - Left atrium: The atrium was normal in size. - Inferior vena cava: The vessel was dilated. The respirophasic   diameter changes were blunted (< 50%), consistent with elevated   central venous pressure. Impressions: - Compared to a prior study in 2018, the LVEF is higher at 60-65%.  Patient Profile     67 y.o. female with PAFib, HTN, DM admitted with recurrent ?syncope.  Assessment & Plan    1. PAFib     S/p EPS ablation last week     Echo looks good, no pericardial effusion     PAFib  episodes not unexpected post ablation     Minimal bradycardia without symptoms here, continue amiodarone  2. CP     This am is completely reproducible with palpation of her chest wall and non-cardiac     Trop noted, not ACS  Echo looks good, no pericardial effusion     EKGs without ischemic changes   3. ? Syncope     Long hx of similar/same events     C/w neurology w/u     She has not had symptoms with the mild bradycardia observed here, no post termination pauses    No changes from out standpoint, keep etablished EP follow up        For questions or updates, please contact [facility] Please consult [email or web address] for contact info under Cardiology/STEMI.      Signed, Sheilah Pigeon, PA-C  11/24/2017, 7:17 AM    I have seen, examined the patient, and reviewed the above assessment and plan.  Changes to above are made where necessary.  On exam, RRR.  Reproducible chest pain as above.  Her symptoms are nonspecific.  Monitoring reveals that ERAF is improving with amiodarone.  No prolonged pauses or worrisome bradycardic events.  No further inpatient cardiology workup planned. Will defer neurology and ongoing medical follow-up to primary team. EP to see as needed Please call with questions.  Co Sign: [name], MD 11/24/2017 5:10 PM

## 2017-11-24 NOTE — Progress Notes (Signed)
Inpatient Diabetes Program Recommendations  AACE/ADA: New Consensus Statement on Inpatient Glycemic Control (2015)  Target Ranges:  Prepandial:   less than 140 mg/dL      Peak postprandial:   less than 180 mg/dL (1-2 hours)      Critically ill patients:  140 - 180 mg/dL   Lab Results  Component Value Date   GLUCAP 260 (H) 11/24/2017   HGBA1C 9.3 (H) 11/23/2017    Review of Glycemic ControlResults for Toni RosenthalFLOWERS, Loralai F (MRN [id number]) as of 11/24/2017 14:39  Ref. Range 11/23/2017 18:08 11/23/2017 21:37 11/24/2017 06:11 11/24/2017 07:51 11/24/2017 11:52  Glucose-Capillary Latest Ref Range: 65 - 99 mg/dL 409157 (H) 811179 (H) 914203 (H) 199 (H) 260 (H)    Diabetes history: Type 2 DM  Outpatient Diabetes medications: Humalog 18 units if <200 mg/dL or 32 units if > 782200 mg/dL, Lantus 50 units q HS Current orders for Inpatient glycemic control:  Novolog moderate tid with meals and HS, Lantus 40 units q HS, Novolog 4 units tid with meals.   Inpatient Diabetes Program Recommendations:    May consider increasing Novolog meal coverage to 8 units tid with meals.   Thanks Beryl MeagerJenny Jaslyn Bansal, RN, BC-ADM Inpatient Diabetes Coordinator Pager [phone] (8a-5p)

## 2017-11-25 ENCOUNTER — Inpatient Hospital Stay (HOSPITAL_COMMUNITY): Payer: Medicare Other

## 2017-11-25 ENCOUNTER — Other Ambulatory Visit: Payer: Self-pay | Admitting: Family Medicine

## 2017-11-25 LAB — BASIC METABOLIC PANEL
Anion gap: 10 (ref 5–15)
BUN: 16 mg/dL (ref 6–20)
CO2: 25 mmol/L (ref 22–32)
CREATININE: 1.51 mg/dL — AB (ref 0.44–1.00)
Calcium: 9.1 mg/dL (ref 8.9–10.3)
Chloride: 101 mmol/L (ref 101–111)
GFR calc Af Amer: 41 mL/min — ABNORMAL LOW (ref >60)
GFR, EST NON AFRICAN AMERICAN: 35 mL/min — AB (ref >60)
GLUCOSE: 195 mg/dL — AB (ref 65–99)
POTASSIUM: 4.4 mmol/L (ref 3.5–5.1)
SODIUM: 136 mmol/L (ref 135–145)

## 2017-11-25 LAB — CBC
HEMATOCRIT: 36.9 % (ref 36.0–46.0)
HEMOGLOBIN: 11.7 g/dL — AB (ref 12.0–15.0)
MCH: 28.8 pg (ref 26.0–34.0)
MCHC: 31.7 g/dL (ref 30.0–36.0)
MCV: 90.9 fL (ref 78.0–100.0)
Platelets: 166 10*3/uL (ref 150–400)
RBC: 4.06 MIL/uL (ref 3.87–5.11)
RDW: 14.6 % (ref 11.5–15.5)
WBC: 4.2 10*3/uL (ref 4.0–10.5)

## 2017-11-25 LAB — GLUCOSE, CAPILLARY
GLUCOSE-CAPILLARY: 218 mg/dL — AB (ref 65–99)
Glucose-Capillary: 212 mg/dL — ABNORMAL HIGH (ref 65–99)
Glucose-Capillary: 237 mg/dL — ABNORMAL HIGH (ref 65–99)

## 2017-11-25 MED ORDER — AMLODIPINE BESYLATE 5 MG PO TABS: 5.0000 mg | ORAL_TABLET | Freq: Every day | ORAL | 0 refills | Status: DC

## 2017-11-25 MED ORDER — LORAZEPAM 2 MG/ML IJ SOLN: 1.0000 mg | Freq: Once | INTRAMUSCULAR | Status: DC

## 2017-11-25 MED ORDER — INSULIN GLARGINE 100 UNIT/ML SOLOSTAR PEN: 40.0000 [IU] | PEN_INJECTOR | Freq: Every day | SUBCUTANEOUS | 11 refills | Status: AC

## 2017-11-25 MED ORDER — AMIODARONE HCL 200 MG PO TABS: 200.0000 mg | ORAL_TABLET | Freq: Two times a day (BID) | ORAL | 0 refills | Status: DC

## 2017-11-25 MED ORDER — GADOBENATE DIMEGLUMINE 529 MG/ML IV SOLN
20.0000 mL | Freq: Once | INTRAVENOUS | Status: AC | PRN
  Administered 2017-11-25: 20 mL via INTRAVENOUS

## 2017-11-25 NOTE — Discharge Summary (Addendum)
[facility]  Patient name: Toni RosenthalLinda F [name] Medical record number: [id number] Date of birth: 07/05/1951 Age: 67 y.o. Gender: female Date of Admission: 11/22/2017  Date of Discharge: 11/25/2017  Admitting Physician: Briscoe Deutscherimothy S Opyd, MD  Primary Care Provider: Berton BonMikell, [name], MD Consultants: Electrophysiology, neurology  Indication for Hospitalization: Multiple episodes of loss of consciousness  Discharge Diagnoses/Problem List:  Patient Active Problem List   Diagnosis Date Noted  . Cardiac syncope 11/22/2017  . Type II diabetes mellitus (HCC)   . Bipolar disorder (HCC)   . Atrial fibrillation (HCC)   . Chronic back pain   . CKD (chronic kidney disease), stage III (HCC)   . Depression   . Anxiety   . Paroxysmal A-fib (HCC)   . Diarrhea 09/03/2017  . Estrogen deficiency 06/07/2017  . Healthcare maintenance 06/03/2017  . Paroxysmal atrial fibrillation (HCC) 05/18/2017  . A-fib (HCC) 05/05/2017  . Back pain 12/11/2016  . Pelvic pain 11/25/2016  . Chronic pain 09/08/2016  . Spells of decreased attentiveness 09/08/2016  . Persistent atrial fibrillation (HCC)   . Irritable bowel syndrome 03/03/2016  . Chronic leukopenia 09/13/2015  . PAF (paroxysmal atrial fibrillation) (HCC) 08/22/2015  . Morbid obesity (HCC) 07/11/2015  . Bipolar I disorder (HCC) 05/22/2015  . Chronic kidney disease (CKD), stage III (moderate) (HCC) 05/14/2015  . Insulin dependent diabetes mellitus (HCC)   . Seizure disorder (HCC)   . History of diverticulitis   . Syncope 06/29/2012  . Orthostatic hypotension 06/29/2012  . History of colonic polyps 09/01/2011  . Sarcoid (HCC) 09/01/2011  . GERD (gastroesophageal reflux disease) 09/01/2011  . 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 Diabetes mellitus type 2, uncontrolled, with complications (HCC) 11/19/2008  . Hyperlipidemia 11/19/2008  . Essential hypertension 11/19/2008   Disposition: Home  Discharge Condition:  Improved  Discharge Exam:  Gen: NAD, lying in bed CV: regular rate, no mrg Pulm: NWOB, CTAB with no crackles, wheezes, or rhonchi GI: Normal bowel sounds present. Soft, Nontender, Nondistended. MSK: 2+ pitting LE edema Skin: warm, dry Neuro: grossly normal, moves all extremities Psych: Normal affect and thought content  Brief Hospital Course:  [name] is a 67 y.o. female who presented after having 4 episodes over the past week of loss of consciousness.  On admission patient was bradycardic in sinus rhythm otherwise stable.  Of note, patient had recent cardiac ablation for atrial fibrillation.  Patient was seen by electrophysiology who stopped her metoprolol due to her bradycardia and placed her on amiodarone.  Patient did have one episode of atrial fibrillation with RVR with chest pain.  Per electrophysiology, this is not abnormal after recent ablation.  Patient went back into rate controlled sinus rhythm and chest pain resolved.  No changes in troponins or ST elevation.  Patient had echocardiogram which did not show any acute changes from baseline (results below).  Workup by cardiology led them to think that her loss of consciousness episodes were not due to her heart.  Given patient's history of possible seizure activity, neurology was consulted.  Patient had EEG and normal head MRI.  Neurology did not recommending starting epileptiics and recommend outpatient follow-up. Other workup was unremarkable including normal electrolytes and labs.  Patient had normal orthostatics.  Patient was evaluated by physical therapy recommended outpatient physical therapy.  Upon discharge, patient was stable on room air, tolerating p.o., normal urine output, normotensive, and rate controlled.  Issues for Follow Up:  1. Consider stopping Flomax as a potential source of orthostatics hypertension.  Monitor for urinary retention. 2. Please ensure that patient establishes follow-up with outpatient  neurology.  Significant Procedures: None  Significant Labs and Imaging:  Recent Labs  Lab 11/23/17 0621 11/24/17 0159 11/25/17 0448  WBC 3.8* 4.6 4.2  HGB 11.8* 11.8* 11.7*  HCT 36.7 36.9 36.9  PLT 157 182 166   Recent Labs  Lab 11/22/17 1245 11/22/17 1650 11/23/17 0621 11/24/17 0159 11/25/17 0448  NA 137  --  140 138 136  K 4.8  --  4.1 4.0 4.4  CL 104  --  105 100* 101  CO2 21*  --  26 24 25   GLUCOSE 337*  --  142* 229* 195*  BUN 15  --  13 15 16   CREATININE 1.32*  --  1.19* 1.40* 1.51*  CALCIUM 9.2  --  9.0 9.3 9.1  MG  --   --  1.9  --   --   ALKPHOS  --  100  --   --   --   AST  --  25  --   --   --   ALT  --  18  --   --   --   ALBUMIN  --  3.6  --   --   --    Dg Chest 2 View  Result Date: 11/23/2017 CLINICAL DATA:  Chest pain. EXAM: CHEST - 2 VIEW COMPARISON:  11/22/2017. FINDINGS: Mediastinum hilar structures normal. Mild left base subsegmental atelectasis and/or scarring. No pleural effusion or pneumothorax. Heart size normal. Cervicothoracic spine effusion. IMPRESSION: Mild left base subsegmental atelectasis and/or scarring. Electronically Signed   By: Maisie Fus  Register   On: 11/23/2017 14:24   Dg Chest 2 View  Result Date: 11/22/2017 CLINICAL DATA:  Patient underwent ablation for atrial fibrillation 6 days ago and has been having tremors ever since with pain in the back and extending into the right leg. Patient reports syncopal episodes at home. EXAM: CHEST - 2 VIEW COMPARISON:  Chest x-ray of May 05, 2017 FINDINGS: The lungs are adequately inflated and clear. The heart is mildly enlarged. The central pulmonary vascularity is prominent. There is no definite cephalization of the vascular pattern. The mediastinum is normal in width. There is no pleural effusion. The bony thorax exhibits no acute abnormality. IMPRESSION: Mild prominence of the cardiac silhouette and central pulmonary vascularity. No pulmonary edema or pneumonia. Electronically Signed   By: David   [place] M.D.   On: 11/22/2017 17:07   Ct Head Wo Contrast  Result Date: 11/22/2017 CLINICAL DATA:  Tremors EXAM: CT HEAD WITHOUT CONTRAST TECHNIQUE: Contiguous axial images were obtained from the base of the skull through the vertex without intravenous contrast. COMPARISON:  MRI brain 05/13/2017, CT brain 05/10/2016 FINDINGS: Brain: No evidence of acute infarction, hemorrhage, hydrocephalus, extra-axial collection or mass lesion/mass effect. Vascular: No hyperdense vessels. Scattered calcifications at the carotid siphons. Skull: Normal. Negative for fracture or focal lesion. Sinuses/Orbits: Mild mucosal thickening in the ethmoid sinuses. No acute orbital abnormality. Other: None IMPRESSION: 1. Negative.  No CT evidence for acute intracranial abnormality. Electronically Signed   By: Jasmine Pang M.D.   On: 11/22/2017 18:09    Results/Tests Pending at Time of Discharge: None  Discharge Medications:  Allergies as of 11/25/2017      Reactions   Prednisone Other (See Comments)   SENT PATIENT INTO A-FIB    Amitriptyline Other (See Comments)   Made the patient disoriented   Hydromorphone Hcl Other (See Comments)   Made the blood pressure  drop (HYPOtension)      Medication List    STOP taking these medications   dofetilide 250 MCG capsule Commonly known as:  [name]   metoprolol tartrate 50 MG tablet Commonly known as:  LOPRESSOR   ondansetron 4 MG disintegrating tablet Commonly known as:  ZOFRAN ODT     TAKE these medications   albuterol 108 (90 Base) MCG/ACT inhaler Commonly known as:  PROAIR HFA inhale 2 puffs every 6 hours if needed for wheezing or shortness of breath   amiodarone 200 MG tablet Commonly known as:  PACERONE Take 1 tablet (200 mg total) by mouth 2 (two) times daily.   amLODipine 5 MG tablet Commonly known as:  NORVASC Take 1 tablet (5 mg total) by mouth daily. Start taking on:  11/26/2017   atorvastatin 80 MG tablet Commonly known as:  LIPITOR Take 80 mg by  mouth at bedtime.   calcium carbonate 600 MG Tabs tablet Commonly known as:  OS-CAL Take 600 mg by mouth daily.   cholecalciferol 1000 units tablet Commonly known as:  VITAMIN D Take 1,000 Units by mouth 2 (two) times daily.   Cranberry 500 MG Caps Take 500 mg by mouth 2 (two) times daily.   DULERA 100-5 MCG/ACT Aero Generic drug:  mometasone-formoterol inhale 2 puffs INTO THE LUNGS every 12 hours   DULoxetine 60 MG capsule Commonly known as:  CYMBALTA Take 60 mg by mouth at bedtime.   esomeprazole 40 MG capsule Commonly known as:  NEXIUM Take 1 capsule (40 mg total) by mouth daily at 12 noon. What changed:  when to take this   EVZIO 0.4 MG/0.4ML Soaj Generic drug:  Naloxone HCl Inject 0.4 mLs as directed daily as needed (for overdose).   fluticasone 50 MCG/ACT nasal spray Commonly known as:  FLONASE instill 2 sprays into each nostril once daily What changed:    how much to take  how to take this  when to take this  additional instructions   HUMALOG KWIKPEN 100 UNIT/ML KiwkPen Generic drug:  insulin lispro Inject 18-32 Units into the skin 3 (three) times daily with meals. PER Patient home SLIDING SCALE 18 units if CBG < 200 32 units  If CBG  > 200   Insulin Glargine 100 UNIT/ML Solostar Pen Commonly known as:  LANTUS SOLOSTAR Inject 40 Units into the skin at bedtime. What changed:  how much to take   loperamide 2 MG tablet Commonly known as:  IMODIUM A-D Take 1 tablet (2 mg total) by mouth 4 (four) times daily as needed for diarrhea or loose stools.   loratadine 10 MG tablet Commonly known as:  CLARITIN Take 1 tablet (10 mg total) by mouth daily.   magnesium oxide 400 MG tablet Commonly known as:  MAG-OX Take 1 tablet (400 mg total) by mouth 2 (two) times daily.   nitroGLYCERIN 0.4 MG SL tablet Commonly known as:  NITROSTAT Place 1 tablet (0.4 mg total) under the tongue every 5 (five) minutes as needed for chest pain (do nto exceed 3 doses). What  changed:    when to take this  reasons to take this   ondansetron 4 MG tablet Commonly known as:  ZOFRAN Take 1 tablet (4 mg total) by mouth every 8 (eight) hours as needed for nausea or vomiting.   PERCOCET 10-325 MG tablet Generic drug:  oxyCODONE-acetaminophen Take 1 tablet by mouth 5 (five) times daily. (scheduled)   potassium chloride 10 MEQ tablet Commonly known as:  K-DUR take 1 tablet  by mouth once daily What changed:    how much to take  how to take this  when to take this   pregabalin 100 MG capsule Commonly known as:  LYRICA Take 100 mg by mouth 3 (three) times daily.   REFRESH OP Place 1 drop into both eyes 3 (three) times daily as needed (for dry eyes).   rivaroxaban 20 MG Tabs tablet Commonly known as:  XARELTO Take 20 mg by mouth once a day with supper What changed:    how much to take  how to take this  when to take this  additional instructions   tamsulosin 0.4 MG Caps capsule Commonly known as:  FLOMAX Take 0.4 mg by mouth at bedtime.   vitamin C 500 MG tablet Commonly known as:  ASCORBIC ACID Take 500 mg by mouth 2 (two) times daily.   VOLTAREN 1 % Gel Generic drug:  diclofenac sodium APPLY 2 INCHES TO AFFECTED AREA FOUR TIMES A DAY What changed:  See the new instructions.            Durable Medical Equipment  (From admission, onward)        Start     Ordered   11/24/17 1325  For home use only DME 4 wheeled rolling walker with seat  Once    Question:  Patient needs a walker to treat with the following condition  Answer:  Gait abnormality   11/24/17 1325   11/24/17 1158  For home use only DME Walker rolling  Once    Question:  Patient needs a walker to treat with the following condition  Answer:  Syncope   11/24/17 1157      Discharge Instructions: Please refer to Patient Instructions section of EMR for full details.  Patient was counseled important signs and symptoms that should prompt return to medical care, changes in  medications, dietary instructions, activity restrictions, and follow up appointments.   Follow-Up Appointments: Follow-up Information    Newman Nip, NP Follow up on 12/03/2017.   Specialties:  Nurse Practitioner, Cardiology Why:  10:30pm Contact information: [address] [place] [address] [phone]        Toni Bon, MD Follow up on 11/30/2017.   Specialty:  Family Medicine Why:  3:50PM  Contact information: [address] [place] [address] [phone]        Caryl Pina, MD. Schedule an appointment as soon as possible for a visit in 2 week(s).   Specialty:  Neurology Contact information: [address] [place] [address] [phone]           Garnette Gunner, MD 11/25/2017, 12:40 PM PGY-1, [facility]

## 2017-11-25 NOTE — Care Management Note (Signed)
Case Management Note  Patient Details  Name: Myles RosenthalLinda F Shipes MRN: [id number] Date of Birth: Mar 04, 1951  Action/Plan: CM talked to patient about Outpatient PT; [facility]; they will call her with a start up date and time for therapy; Rollater ( rolling walker with seat) and shower stool ordered and to be delivered to room today prior to discharging home.  Expected Discharge Date:  11/25/17               Expected Discharge Plan:   Home     Status of Service:   In progress  Cherrie DistanceChandler, Ilaria Much L, RN ,MHA,BSN [phone] 11/25/2017, 3:32 PM

## 2017-11-25 NOTE — Progress Notes (Signed)
Family Medicine Teaching Service Daily Progress Note Intern Pager: [phone]  Patient name: [name] Medical record number: [id number] Date of birth: [birth year] Age: 67 y.o. Gender: female  Primary Care Provider: [name], MD Consultants: Electrophysiology Code Status: Full  Pt Overview and Major Events to Date:  [name] is a 67 y.o. female who presents with 4 episodes of loss of consciousness over the past week.  Past medical history includes paroxysmal atrial fibrillation on Xarelto and status post ablation last week, chronic back pain, hypertension, diabetes type 2, depression  Assessment and Plan:  Episodes of loss of consciousness Current work up negative including cardiac eval by EP, metabolic, electrolyte, negative EEG and MRI. Neurology recommends starting anticonvulsant and outpatient neurology follow up. -HHPT,  -Telemetry -Appreciate electrophysiology recommendations -Neurology consulted, appreciate recommendations - Medically stable for discharge  Chest pain, resolved No longer present. Cardiac work up showed resolution of Afib w/ RVR, neg troponins, neg CXR - cont to monitor  Atrial fibrillation Currently rate controlled. Rhythm control with amiodarone 200 mg twice daily.  CHADS-Vasc2 4.  Anticoagulated on Xarelto 20 mg.  -Telemetry -Hold metoprolol per EP recommendations,  -EP recommendations -Continue Xarelto  History of seizures She had what was documented as a seizure starting back in 2015.  Has followed with neurology, last seen 05, 2018.  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  Since then she has been off of lamotrigine as antiepileptic.  EEG normal this admission, see above for more information. -Appreciate neurology recognitions  Chronic back pain Patient says that she has had chronic back pain due to to [facility] she is same several years ago.  Takes oxycodone  10-325 mg up to 5 times a day as needed and Lyrica 100 mg TID.  -oxycodone 5 mg every 4 as needed, home lyrica -Continue Cymbalta -d/c w/ home regimen  Hypertension, Improved.  Normotensive. - cont amlodipine 5 mg -Hydralazine 10 mg every 4 as needed BP greater than 175//90 -Monitor BP  Diabetes mellitus type 2 A1C 9.3 on admit. CBGs over the past on 4 hours of been 180s-200s. At home patient takes 50 units Lantus nightly, Humalog sliding scale 18-32 units -Moderate sliding scale insulin +4 units 3 times daily with meals -Lantus 40 units - d/c back on home regimen  Bipolar type I versus depression/anxiety Patient on Cymbalta 60 mg daily for mood and chronic pain.SSRI not usually indicated in Bipolar as may precipitate mania. Will monitor mood. -Continue Cymbalta -Consider changing agents in outpatient  Asthma, stable Home meds Dulera,  albuterol inhaler prn.  -cont home meds  GERD Takes Protonix at home -Continue Protonix  CKD 3 Creatinine 1.4> 1.5. Unsure for rase. Normal UOP. No evidence of volume overload.  Baseline 1.2. -Monitor creatinine with daily BMP - recommend outpatient montoring  Urinary retention? Patient takes Flomax daily 0.4 mg.  Does not endorse any symptoms of dysuria hematuria, increased frequency on exam. Patient says that she has been on it for about 1 year for urinary retension. Orthostatics normal this admission. Robust UOP past 24 hr.  -Continue tamsulosin -Monitor I's and O's - Outpatient monitoring and possible discontinueation, as this may be leading to orthostatic hypotension causing symptoms  Chronic Leukopenia Pt has h/o chronic leukopenia, WBC stable around 4, at baseline. Afebrile. -consider further work up as outpatient as necessary  FEN/GI: Heart healthy diet PPx: Anticoagulation with Xarelto  Disposition: Stable for discharge  Subjective:  Patient feels better. No CP, SOB. Endorses mild HA. No other complaints.    Objective: Temp:  [97.3 F (36.3 C)-98.5 F (36.9 C)] 98 F (36.7 C) (04/11 0747) Pulse Rate:  [50-94] 50 (04/11 0747) Resp:  [16-20] 17 (04/11 0747) BP: (113-135)/(62-72) 135/63 (04/11 0747) SpO2:  [94 %-99 %] 97 % (04/11 0747) Weight:  [217 lb 3.2 oz (98.5 kg)] 217 lb 3.2 oz (98.5 kg) (04/11 0455) Physical Exam: Gen: NAD, lying in bed CV: regular rate, no mrg Pulm: NWOB, CTAB with no crackles, wheezes, or rhonchi GI: Normal bowel sounds present. Soft, Nontender, Nondistended. MSK: 2+ pitting LE edema Skin: warm, dry Neuro: grossly normal, moves all extremities Psych: Normal affect and thought content  Laboratory: Recent Labs  Lab 11/23/17 0621 11/24/17 0159 11/25/17 0448  WBC 3.8* 4.6 4.2  HGB 11.8* 11.8* 11.7*  HCT 36.7 36.9 36.9  PLT 157 182 166   Recent Labs  Lab 11/22/17 1650 11/23/17 0621 11/24/17 0159 11/25/17 0448  NA  --  140 138 136  K  --  4.1 4.0 4.4  CL  --  105 100* 101  CO2  --  26 24 25   BUN  --  13 15 16   CREATININE  --  1.19* 1.40* 1.51*  CALCIUM  --  9.0 9.3 9.1  PROT 7.0  --   --   --   BILITOT 0.6  --   --   --   ALKPHOS 100  --   --   --   ALT 18  --   --   --   AST 25  --   --   --   GLUCOSE  --  142* 229* 195*   Garnette Gunnerhompson, Lysha Schrade B, MD 11/25/2017, 8:51 AM PGY-1, [facility] FPTS Intern pager: [phone], text pages welcome

## 2017-11-25 NOTE — Progress Notes (Signed)
MRI brain normal for age. No acute intracranial abnormality Identified.  A/R: Given negative EEG and MRI, the patient can be followed clinically as an outpatient. No findings on exam or testing to militate strongly in favor of seizures as an etiology. Please see my initial consult note for further discussion. Given unclear etiology for her presentation and possible functionality, benefits of starting an anticonvulsant most likely outweighed by risks. If spells recur, she will need to be reassessed.   Electronically signed: Dr. Caryl PinaEric Bunnie Lederman

## 2017-11-26 ENCOUNTER — Telehealth: Payer: Self-pay | Admitting: Internal Medicine

## 2017-11-26 NOTE — Telephone Encounter (Signed)
Pt would like [name] or Mikells nurse to call her back concerning a referral that was placed while she was in the hospital. She said the hospital referred her to neurology but the office is an hour or so away and she has her own neurologist here that she goes to. Please give her a call to discuss this.

## 2017-11-29 ENCOUNTER — Ambulatory Visit: Payer: Medicare Other | Admitting: Internal Medicine

## 2017-11-30 ENCOUNTER — Encounter: Payer: Self-pay | Admitting: Internal Medicine

## 2017-11-30 ENCOUNTER — Ambulatory Visit (INDEPENDENT_AMBULATORY_CARE_PROVIDER_SITE_OTHER): Payer: Medicare Other | Admitting: Internal Medicine

## 2017-11-30 VITALS — BP 152/80 | HR 67 | Temp 98.5°F | Ht 67.0 in | Wt 215.0 lb

## 2017-11-30 DIAGNOSIS — I10 Essential (primary) hypertension: Secondary | ICD-10-CM

## 2017-11-30 DIAGNOSIS — R55 Syncope and collapse: Secondary | ICD-10-CM | POA: Diagnosis not present

## 2017-11-30 DIAGNOSIS — R269 Unspecified abnormalities of gait and mobility: Secondary | ICD-10-CM

## 2017-11-30 MED ORDER — AMLODIPINE BESYLATE 5 MG PO TABS: 10.0000 mg | ORAL_TABLET | Freq: Every day | ORAL | 0 refills | Status: DC

## 2017-11-30 NOTE — Telephone Encounter (Signed)
Called patient and relayed message concerning neurologist referral from hospital and that it is ok to keep the neurologist that she is established with.Patient has an appointment today 11/30/2017 at 1550 and says that she will discuss the physical therapy with you at that time.Toni HawkSimpson, Linsi Humann R, CMA

## 2017-11-30 NOTE — Telephone Encounter (Signed)
Please let patient know she can continue with her regular neurologist that she is already established. No further referrals are needed. Please let patient know. Thanks.

## 2017-11-30 NOTE — Progress Notes (Signed)
   [facility] Noralee CharsAsiyah Millicent Blazejewski, MD Phone: [phone]  Reason For Visit: Hospital Follow up   # Syncope Patient was seen in the hospital for presyncopal episodes.  It was thought to be due to bradycardia metoprolol was stopped.  Placed patient was placed on amiodarone.  Patient was then seen by neurology -with a negative EEG and normal MRI.  Lab work was in general unremarkable orthostatics were normal.  Patient does have several medications which could be contributing including her pain medications and Flomax that she takes for urge incontinence.  Patient does not want to make changes to either of these medications.  Has been discussed with her in the past as she comes into my office often with complaints of dizziness and fatigue.  Patient plans to follow-up with neurology.   # CHRONIC HTN: Current Meds - Norvasc 5 mg  Reports good compliance, took meds today. Tolerating well, w/o complaints. Denies CP, dyspnea, HA, edema, dizziness / lightheadedness  Past Medical History Reviewed problem list.  Medications- reviewed and updated No additions to family history Social history- patient is a non- smoker  Objective: BP (!) 152/80 (BP Location: Left Arm, Patient Position: Sitting, Cuff Size: Normal)   Pulse 67   Temp 98.5 F (36.9 C) (Oral)   Ht 5\' 7"  (1.702 m)   Wt 215 lb (97.5 kg)   SpO2 96%   BMI 33.67 kg/m  Gen: NAD, alert, cooperative with exam Cardio: regular rate and rhythm, S1S2 heard, no murmurs appreciated Pulm: clear to auscultation bilaterally, no wheezes, rhonchi or rales Skin: dry, intact, no rashes or lesions  Assessment/Plan: See problem based a/p  Syncope Evaluated in the hospital for syncopal episodes.  No abnormalities noted by cardiology or neurology.  Metoprolol was stopped and patient was placed on amiodarone. -Follow-up with neurology as outpatient -Medications which could be contributing to this including her daily Percocet and  Flomax-patient is not willing to change these medications states she was on them before she started having the syncopal episodes.  -Follow-up as needed  Essential hypertension Blood pressure elevated today - will increase Norvasc to 10 mg -Follow-up in 1 month

## 2017-11-30 NOTE — Patient Instructions (Signed)
I want you to increase your Norvasc to 10 mg to help improve your blood pressure please follow-up in about 2-3 months.  I have placed an order for physical therapy which should go through.

## 2017-12-03 ENCOUNTER — Encounter (HOSPITAL_COMMUNITY): Payer: Self-pay | Admitting: Nurse Practitioner

## 2017-12-03 ENCOUNTER — Ambulatory Visit (HOSPITAL_COMMUNITY)
Admit: 2017-12-03 | Discharge: 2017-12-03 | Disposition: A | Discharge status: Home / Self Care | Payer: Medicare Other | Source: Ambulatory Visit | Attending: Nurse Practitioner | Admitting: Nurse Practitioner

## 2017-12-03 VITALS — BP 148/74 | HR 76 | Ht 67.0 in | Wt 219.0 lb

## 2017-12-03 DIAGNOSIS — F419 Anxiety disorder, unspecified: Secondary | ICD-10-CM | POA: Insufficient documentation

## 2017-12-03 DIAGNOSIS — M797 Fibromyalgia: Secondary | ICD-10-CM | POA: Diagnosis not present

## 2017-12-03 DIAGNOSIS — E785 Hyperlipidemia, unspecified: Secondary | ICD-10-CM | POA: Insufficient documentation

## 2017-12-03 DIAGNOSIS — F319 Bipolar disorder, unspecified: Secondary | ICD-10-CM | POA: Diagnosis not present

## 2017-12-03 DIAGNOSIS — E1122 Type 2 diabetes mellitus with diabetic chronic kidney disease: Secondary | ICD-10-CM | POA: Diagnosis not present

## 2017-12-03 DIAGNOSIS — D860 Sarcoidosis of lung: Secondary | ICD-10-CM | POA: Diagnosis not present

## 2017-12-03 DIAGNOSIS — J45909 Unspecified asthma, uncomplicated: Secondary | ICD-10-CM | POA: Diagnosis not present

## 2017-12-03 DIAGNOSIS — Z885 Allergy status to narcotic agent status: Secondary | ICD-10-CM | POA: Diagnosis not present

## 2017-12-03 DIAGNOSIS — Z794 Long term (current) use of insulin: Secondary | ICD-10-CM | POA: Insufficient documentation

## 2017-12-03 DIAGNOSIS — Z888 Allergy status to other drugs, medicaments and biological substances status: Secondary | ICD-10-CM | POA: Insufficient documentation

## 2017-12-03 DIAGNOSIS — Z7901 Long term (current) use of anticoagulants: Secondary | ICD-10-CM | POA: Insufficient documentation

## 2017-12-03 DIAGNOSIS — Z8249 Family history of ischemic heart disease and other diseases of the circulatory system: Secondary | ICD-10-CM | POA: Insufficient documentation

## 2017-12-03 DIAGNOSIS — Z833 Family history of diabetes mellitus: Secondary | ICD-10-CM | POA: Diagnosis not present

## 2017-12-03 DIAGNOSIS — I480 Paroxysmal atrial fibrillation: Secondary | ICD-10-CM

## 2017-12-03 DIAGNOSIS — I129 Hypertensive chronic kidney disease with stage 1 through stage 4 chronic kidney disease, or unspecified chronic kidney disease: Secondary | ICD-10-CM | POA: Insufficient documentation

## 2017-12-03 DIAGNOSIS — K589 Irritable bowel syndrome without diarrhea: Secondary | ICD-10-CM | POA: Diagnosis not present

## 2017-12-03 DIAGNOSIS — E1142 Type 2 diabetes mellitus with diabetic polyneuropathy: Secondary | ICD-10-CM | POA: Diagnosis not present

## 2017-12-03 DIAGNOSIS — Z79899 Other long term (current) drug therapy: Secondary | ICD-10-CM | POA: Insufficient documentation

## 2017-12-03 DIAGNOSIS — K219 Gastro-esophageal reflux disease without esophagitis: Secondary | ICD-10-CM | POA: Insufficient documentation

## 2017-12-03 DIAGNOSIS — N183 Chronic kidney disease, stage 3 (moderate): Secondary | ICD-10-CM | POA: Diagnosis not present

## 2017-12-03 DIAGNOSIS — G40909 Epilepsy, unspecified, not intractable, without status epilepticus: Secondary | ICD-10-CM | POA: Insufficient documentation

## 2017-12-03 DIAGNOSIS — Z86010 Personal history of colonic polyps: Secondary | ICD-10-CM | POA: Diagnosis not present

## 2017-12-03 DIAGNOSIS — Z820 Family history of epilepsy and other diseases of the nervous system: Secondary | ICD-10-CM | POA: Insufficient documentation

## 2017-12-03 DIAGNOSIS — Z841 Family history of disorders of kidney and ureter: Secondary | ICD-10-CM | POA: Insufficient documentation

## 2017-12-03 DIAGNOSIS — I4891 Unspecified atrial fibrillation: Secondary | ICD-10-CM | POA: Diagnosis present

## 2017-12-03 NOTE — Progress Notes (Signed)
error 

## 2017-12-03 NOTE — Progress Notes (Signed)
Primary Care Physician: [name], MD Referring Physician: Dr. Denyce Robert [name] is a 67 y.o. female with a h/o afib s/p ablation 11/16/17. She presented to the ER after she had LOC after the past week. She was bradycardic but in SR, otherwise stable.Patient was seen by electrophysiology who stopped her metoprolol due to her bradycardia , stopped tikosyn and  placed her on amiodarone.  Patient did have one episode of atrial fibrillation with RVR with chest pain.  Per electrophysiology, this is not abnormal after recent ablation.  Patient went back into rate controlled sinus rhythm and chest pain resolved.  No changes in troponins or ST elevation.  Patient had echocardiogram which did not show any acute changes from baseline (results below).  Workup by cardiology led them to think that her loss of consciousness episodes were not due to her heart.  Given patient's history of possible seizure activity, neurology was consulted.  Patient had EEG and normal head MRI.  Neurology did not recommending starting epileptiics and recommend outpatient follow-up. Other workup was unremarkable including normal electrolytes and labs.  Patient had normal orthostatics.  Patient was evaluated by physical therapy recommended outpatient physical therapy.  Upon discharge, patient was stable on room air, tolerating p.o., normal urine output, normotensive, and rate controlled.  In the afib clinic on f/u, she is in SR. She does not report any other issues with LOC. No afib that she is aware of. No complaints. No swallowing or groin issues from ablation.  Today, she denies symptoms of palpitations, chest pain, shortness of breath, orthopnea, PND, lower extremity edema, dizziness, presyncope, syncope, or neurologic sequela. The patient is tolerating medications without difficulties and is otherwise without complaint today.   Past Medical History:  Diagnosis Date  . Anxiety   . Arthritis    "knees, hands,  ankles, feet" (05/12/2016)  . Asthma    Dr. [name]  . Bipolar disorder (HCC)   . Chronic back pain    "lower and middle" (05/12/2016)  . CKD (chronic kidney disease), stage III (HCC)   . Colon polyps   . Depression   . Diverticulosis   . Epilepsy (HCC)   . Essential hypertension   . Fibromyalgia   . Gallstones   . GERD (gastroesophageal reflux disease)   . H/O hiatal hernia   . Hyperlipidemia   . IBS (irritable bowel syndrome)   . Paroxysmal A-fib (HCC)    failed medical therapy with tikosyn, s/p AF ablation x 2  . Peripheral neuropathy   . Sarcoidosis of lung (HCC)    Dr. [name]  . Seizures (HCC)    "epileptic; pretty regular recently" (05/12/2016)  . Syncope   . Type II diabetes mellitus (HCC)    Past Surgical History:  Procedure Laterality Date  . ABDOMINAL HYSTERECTOMY  1995  . ANTERIOR CERVICAL DECOMP/DISCECTOMY FUSION  07/11/2012   Procedure: ANTERIOR CERVICAL DECOMPRESSION/DISCECTOMY FUSION 1 LEVEL;  Surgeon: [name], MD;  Location: [facility];  Service: Neurosurgery;  Laterality: N/A;  Cervical Five-Six Anterior Cervical Decompression with Fusion Interbody Prothesis Plating and Bonegraft  . ATRIAL FIBRILLATION ABLATION N/A 05/18/2017   Procedure: Atrial Fibrillation Ablation;  Surgeon: [name], MD;  Location: [facility];  Service: Cardiovascular;  Laterality: N/A;  . ATRIAL FIBRILLATION ABLATION N/A 11/16/2017   Procedure: ATRIAL FIBRILLATION ABLATION;  Surgeon: [name], MD;  Location: [facility];  Service: Cardiovascular;  Laterality: N/A;  . CARDIAC CATHETERIZATION  2012   a.  Normal coronaries 2012. b. Normal nuc 09/2014.  Marland Kitchen CARDIAC CATHETERIZATION  05/18/2017  . CHOLECYSTECTOMY OPEN  1976  . CLOSED REDUCTION ANKLE FRACTURE Left 10/2006   "got steel rod in my leg; and screws"  . COLON SURGERY    . DILATION AND CURETTAGE OF UTERUS    . ESOPHAGEAL MANOMETRY  03/21/2012   Procedure: ESOPHAGEAL MANOMETRY (EM);  Surgeon: [name],  MD;  Location: [facility];  Service: Endoscopy;  Laterality: N/A;  . FRACTURE SURGERY    . NEUROPLASTY / TRANSPOSITION MEDIAN NERVE AT CARPAL TUNNEL Left 2004  . NEUROPLASTY / TRANSPOSITION MEDIAN NERVE AT CARPAL TUNNEL Right 2002  . 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 SALPINGOOPHORECTOMY Bilateral 2000  . TEE WITHOUT CARDIOVERSION N/A 05/18/2017   Procedure: TRANSESOPHAGEAL ECHOCARDIOGRAM (TEE);  Surgeon: [name], MD;  Location: [facility] ENDOSCOPY;  Service: Cardiovascular;  Laterality: N/A;  . TUBAL LIGATION  1982    Current Outpatient Medications  Medication Sig Dispense Refill  . albuterol (PROAIR HFA) 108 (90 Base) MCG/ACT inhaler inhale 2 puffs every 6 hours if needed for wheezing or shortness of breath 8.5 g 1  . amiodarone (PACERONE) 200 MG tablet Take 1 tablet (200 mg total) by mouth 2 (two) times daily. 60 tablet 0  . amLODipine (NORVASC) 5 MG tablet Take 2 tablets (10 mg total) by mouth daily. 60 tablet 0  . atorvastatin (LIPITOR) 80 MG tablet Take 80 mg by mouth at bedtime.  0  . calcium carbonate (OS-CAL) 600 MG TABS tablet Take 600 mg by mouth daily.    . cholecalciferol (VITAMIN D) 1000 UNITS tablet Take 1,000 Units by mouth 2 (two) times daily.     . Cranberry 500 MG CAPS Take 500 mg by mouth 2 (two) times daily.    . DULERA 100-5 MCG/ACT AERO inhale 2 puffs INTO THE LUNGS every 12 hours 13 g 11  . DULoxetine (CYMBALTA) 60 MG capsule Take 60 mg by mouth at bedtime.     Marland Kitchen esomeprazole (NEXIUM) 40 MG capsule Take 1 capsule (40 mg total) by mouth daily at 12 noon. (Patient taking differently: Take 40 mg by mouth at bedtime. ) 30 capsule 3  . EVZIO 0.4 MG/0.4ML SOAJ Inject 0.4 mLs as directed daily as needed (for overdose).   0  . fluticasone (FLONASE) 50 MCG/ACT nasal spray instill 2 sprays into each nostril once daily (Patient taking differently: Place 1 spray into both nostrils 2 (two) times daily. ) 16 g 6  . HUMALOG KWIKPEN 100 UNIT/ML KiwkPen Inject 18-32 Units  into the skin 3 (three) times daily with meals. PER Patient home SLIDING SCALE 18 units if CBG < 200 32 units  If CBG  > 200  0  . Insulin Glargine (LANTUS SOLOSTAR) 100 UNIT/ML Solostar Pen Inject 40 Units into the skin at bedtime. (Patient taking differently: Inject 50 Units into the skin at bedtime. ) 15 mL 11  . loperamide (IMODIUM A-D) 2 MG tablet Take 1 tablet (2 mg total) by mouth 4 (four) times daily as needed for diarrhea or loose stools. 30 tablet 0  . loratadine (CLARITIN) 10 MG tablet Take 1 tablet (10 mg total) by mouth daily. 30 tablet 0  . magnesium oxide (MAG-OX) 400 MG tablet Take 1 tablet (400 mg total) by mouth 2 (two) times daily. (Patient taking differently: Take 400 mg by mouth 2 (two) times daily. ) 60 tablet 11  . nitroGLYCERIN (NITROSTAT) 0.4 MG SL tablet Place 1 tablet (0.4 mg  total) under the tongue every 5 (five) minutes as needed for chest pain (do nto exceed 3 doses). (Patient taking differently: Place 0.4 mg under the tongue every 5 (five) minutes x 3 doses as needed for chest pain. ) 25 tablet 2  . ondansetron (ZOFRAN) 4 MG tablet Take 1 tablet (4 mg total) by mouth every 8 (eight) hours as needed for nausea or vomiting. 20 tablet 0  . oxyCODONE-acetaminophen (PERCOCET) 10-325 MG tablet Take 1 tablet by mouth 5 (five) times daily. (scheduled)     . Polyvinyl Alcohol-Povidone (REFRESH OP) Place 1 drop into both eyes 3 (three) times daily as needed (for dry eyes).     . potassium chloride (K-DUR) 10 MEQ tablet take 1 tablet by mouth once daily (Patient taking differently: Take 10 MEQ by mouth once daily) 90 tablet 3  . pregabalin (LYRICA) 100 MG capsule Take 100 mg by mouth 3 (three) times daily.    . rivaroxaban (XARELTO) 20 MG TABS tablet Take 20 mg by mouth once a day with supper (Patient taking differently: Take 20 mg by mouth daily with supper. ) 30 tablet 9  . tamsulosin (FLOMAX) 0.4 MG CAPS capsule Take 0.4 mg by mouth at bedtime.  0  . vitamin C (ASCORBIC ACID)  500 MG tablet Take 500 mg by mouth 2 (two) times daily.    . VOLTAREN 1 % GEL APPLY 2 INCHES TO AFFECTED AREA FOUR TIMES A DAY (Patient taking differently: APPLY 2 INCHES TO AFFECTED AREA THREE TIMES A DAY) 500 g 3   No current facility-administered medications for this encounter.     Allergies  Allergen Reactions  . Prednisone Other (See Comments)    SENT PATIENT INTO A-FIB   . Amitriptyline Other (See Comments)    Made the patient disoriented  . Hydromorphone Hcl Other (See Comments)    Made the blood pressure drop (HYPOtension)    Social History   Socioeconomic History  . Marital status: Divorced    Spouse name: Not on file  . Number of children: 3  . Years of education: 12th  . Highest education level: Not on file  Occupational History  . Occupation: disabled    Comment: CNA  Social Needs  . Financial resource strain: Not on file  . Food insecurity:    Worry: Not on file    Inability: Not on file  . Transportation needs:    Medical: Not on file    Non-medical: Not on file  Tobacco Use  . Smoking status: Never Smoker  . Smokeless tobacco: Never Used  Substance and Sexual Activity  . Alcohol use: No  . Drug use: No  . Sexual activity: Not Currently  Lifestyle  . Physical activity:    Days per week: Not on file    Minutes per session: Not on file  . Stress: Not on file  Relationships  . Social connections:    Talks on phone: Not on file    Gets together: Not on file    Attends religious service: Not on file    Active member of club or organization: Not on file    Attends meetings of clubs or organizations: Not on file    Relationship status: Not on file  . Intimate partner violence:    Fear of current or ex partner: Not on file    Emotionally abused: Not on file    Physically abused: Not on file    Forced sexual activity: Not on file  Other Topics  Concern  . Not on file  Social History Narrative   Pt. Lives at home with her daughter. She is single, has  three children, does not work currently, and has a 12th grade education level. She has never used tobacco or alcohol. Quit using illicit drugs at the age of 68 and rarely ha caffeine.    Family History  Problem Relation Age of Onset  . Heart attack Mother        @ age 96  . Mental illness Mother   . Diabetes Mother   . Hypertension Mother        siblings  . Alzheimer's disease Mother   . Depression Mother   . Hyperlipidemia Mother   . Heart attack Brother 50  . Alcohol abuse Brother   . Depression Brother   . Diabetes Brother   . Hyperlipidemia Brother   . Hypertension Brother   . Kidney disease Brother   . Drug abuse Brother   . Alcohol abuse Father   . Heart attack Father   . Hyperlipidemia Father   . Hypertension Father   . Colon cancer Maternal Aunt   . Prostate cancer Maternal Grandfather   . Diabetes Maternal Grandfather   . Hyperlipidemia Maternal Grandfather   . Ovarian cancer Maternal Aunt   . Diabetes Unknown   . Hypertension Unknown   . Lupus Sister   . Alcohol abuse Sister   . Depression Sister   . Diabetes Sister   . Hyperlipidemia Sister   . Hypertension Sister   . Kidney disease Sister   . Drug abuse Sister   . Ovarian cancer Cousin   . Diabetes Maternal Grandmother   . Hyperlipidemia Maternal Grandmother   . Breast cancer Neg Hx     ROS- All systems are reviewed and negative except as per the HPI above  Physical Exam: Vitals:   12/03/17 1034  BP: (!) 148/74  Pulse: 76  SpO2: 97%  Weight: 219 lb (99.3 kg)  Height: 5\' 7"  (1.702 m)   Wt Readings from Last 3 Encounters:  12/03/17 219 lb (99.3 kg)  11/30/17 215 lb (97.5 kg)  11/25/17 217 lb 3.2 oz (98.5 kg)    Labs: Lab Results  Component Value Date   NA 136 11/25/2017   K 4.4 11/25/2017   CL 101 11/25/2017   CO2 25 11/25/2017   GLUCOSE 195 (H) 11/25/2017   BUN 16 11/25/2017   CREATININE 1.51 (H) 11/25/2017   CALCIUM 9.1 11/25/2017   MG 1.9 11/23/2017   Lab Results  Component  Value Date   INR 1.2 04/20/2017   Lab Results  Component Value Date   CHOL 159 07/26/2015   HDL 37 (L) 07/26/2015   LDLCALC NOT CALC 07/26/2015   TRIG 530 (H) 07/26/2015     GEN- The patient is well appearing, alert and oriented x 3 today.   Head- normocephalic, atraumatic Eyes-  Sclera clear, conjunctiva pink Ears- hearing intact Oropharynx- clear Neck- supple, no JVP Lymph- no cervical lymphadenopathy Lungs- Clear to ausculation bilaterally, normal work of breathing Heart- Regular rate and rhythm, no murmurs, rubs or gallops, PMI not laterally displaced GI- soft, NT, ND, + BS Extremities- no clubbing, cyanosis, or edema MS- no significant deformity or atrophy Skin- no rash or lesion Psych- euthymic mood, full affect Neuro- strength and sensation are intact  EKG- SR with sinus arrhythmia, pr int 172 ms, qrs int 78 ms, qtc 288 ms Epic records reviewed    Assessment and Plan: 1. Paroxysmal  afib S/p ablation Off tikosyn Continue loading on amiodarone 200 mg bid In SR, feels well  2. Chadsvasc score of at least 4 Continue xarelto 20 mg daily  3. LOC No further episodes  W/u in hospital negative for neuro cause  4. HTN Stable  F/u here 5/2  Lupita Leash C. [facility] [address] [place], [place] [address] [phone]

## 2017-12-04 ENCOUNTER — Other Ambulatory Visit: Payer: Self-pay | Admitting: Internal Medicine

## 2017-12-04 ENCOUNTER — Encounter: Payer: Self-pay | Admitting: Internal Medicine

## 2017-12-04 DIAGNOSIS — I10 Essential (primary) hypertension: Secondary | ICD-10-CM

## 2017-12-04 MED ORDER — AMLODIPINE BESYLATE 10 MG PO TABS: 10.0000 mg | ORAL_TABLET | Freq: Every day | ORAL | 0 refills | Status: DC

## 2017-12-04 NOTE — Assessment & Plan Note (Signed)
Evaluated in the hospital for syncopal episodes.  No abnormalities noted by cardiology or neurology.  Metoprolol was stopped and patient was placed on amiodarone. -Follow-up with neurology as outpatient -Medications which could be contributing to this including her daily Percocet and Flomax-patient is not willing to change these medications states she was on them before she started having the syncopal episodes.  -Follow-up as needed

## 2017-12-04 NOTE — Assessment & Plan Note (Signed)
Blood pressure elevated today - will increase Norvasc to 10 mg -Follow-up in 1 month

## 2017-12-06 ENCOUNTER — Other Ambulatory Visit: Payer: Self-pay | Admitting: Internal Medicine

## 2017-12-06 ENCOUNTER — Encounter: Payer: Self-pay | Admitting: Nurse Practitioner

## 2017-12-06 ENCOUNTER — Other Ambulatory Visit: Payer: Self-pay

## 2017-12-06 ENCOUNTER — Ambulatory Visit (INDEPENDENT_AMBULATORY_CARE_PROVIDER_SITE_OTHER): Payer: Medicare Other | Admitting: Nurse Practitioner

## 2017-12-06 VITALS — BP 134/82 | HR 68 | Ht 66.5 in | Wt 219.8 lb

## 2017-12-06 DIAGNOSIS — I10 Essential (primary) hypertension: Secondary | ICD-10-CM

## 2017-12-06 DIAGNOSIS — K589 Irritable bowel syndrome without diarrhea: Secondary | ICD-10-CM | POA: Diagnosis not present

## 2017-12-06 DIAGNOSIS — K219 Gastro-esophageal reflux disease without esophagitis: Secondary | ICD-10-CM | POA: Diagnosis not present

## 2017-12-06 MED ORDER — DIPHENOXYLATE-ATROPINE 2.5-0.025 MG PO TABS: 1.0000 | ORAL_TABLET | Freq: Two times a day (BID) | ORAL | 0 refills | Status: DC | PRN

## 2017-12-06 MED ORDER — ESOMEPRAZOLE MAGNESIUM 40 MG PO CPDR: 40.0000 mg | DELAYED_RELEASE_CAPSULE | Freq: Two times a day (BID) | ORAL | 3 refills | Status: DC

## 2017-12-06 NOTE — Progress Notes (Signed)
I agree with the above note, plan 

## 2017-12-06 NOTE — Progress Notes (Signed)
IMPRESSION and PLAN:    #1. 67 yo female with GERD. Having breakthrough sx several times throughout the day and night with regurgitation and heartburn. She used to take Nexium BID, only taking once daily at present. Wants to go back to twice day.  -continue anti-flex measures including wedge pillow.  -increase Nexium to BID    #2. IBS-D.   Requiring several imodium a day. Stool for c-diff negative, lactoferritin was positive when I checked it in Feb -stop imodium -trial of lomotil bid. To call in a week or so with update, may need to increase dose. If absolutely no improvement then will need further evaluation  #3.  AFib, s/p recent ablation. Maintained on amiodarone / xarelto  #4. Recent development of loss of consciousness with associated loss of bowel function. Remote hx of seizures but Neuro felt episodes cardiac in nature. Apparently no cardiac etiology has been determined so she is headed back to see Neuro soon.       HPI:    Chief Complaint: breakththrough GERD and persistent diarrhea   Patient is a 67 year old female medical history not limited to CKD, DM, hypertension , and atrial fibrillation. She is known to Dr. Christella HartiganJacobs for history of diarrhea predominant irritable bowel syndrome, GERD and history of adenomatous colon polyps.  I saw her several weeks ago with GERD symptoms which were uncontrolled on what she was currently taking.  There was confusion about what she was actually taking at home.  Nexium is the only thing that has ever worked for her.  She is now on Nexium but only on once daily dosing and symptoms are still inadequately controlled.  She has breakthrough heartburn and regurgitation throughout the day.  She sleeps on a wedge pillow.  Bonita QuinLinda continues to have frequent, urgent diarrhea.  She has not seen any blood in her stool.  She is taking up to 6 Imodium a day.  Typically first stool of day starts out more formed than its followed by several loose stools  throughout the day. Her weight continues to fluctuate a few pounds but is stable overall   Review of systems:   Palpitations with history of atrial fibrillation.  No shortness of breath.  No fevers.  Past Medical History:  Diagnosis Date  . Anxiety   . Arthritis    "knees, hands, ankles, feet" (05/12/2016)  . Asthma    Dr. Sherene SiresWert  . Bipolar disorder (HCC)   . Chronic back pain    "lower and middle" (05/12/2016)  . CKD (chronic kidney disease), stage III (HCC)   . Colon polyps   . Depression   . Diverticulosis   . Epilepsy (HCC)   . Essential hypertension   . Fibromyalgia   . Gallstones   . GERD (gastroesophageal reflux disease)   . H/O hiatal hernia   . Hyperlipidemia   . IBS (irritable bowel syndrome)   . Paroxysmal A-fib (HCC)    failed medical therapy with tikosyn, s/p AF ablation x 2  . Peripheral neuropathy   . Sarcoidosis of lung (HCC)    Dr. Sherene SiresWert  . Seizures (HCC)    "epileptic; pretty regular recently" (05/12/2016)  . Syncope   . Type II diabetes mellitus (HCC)     Patient's surgical history, family medical history, social history, medications and allergies were all reviewed in Epic    Physical Exam:     BP 134/82   Pulse 68   Ht 5' 6.5" (1.689 m)   Hart[employer] FinancialWt  219 lb 12.8 oz (99.7 kg)   BMI 34.95 kg/m   GENERAL: Black female in NAD PSYCH: :Pleasant, cooperative, normal affect EENT:  conjunctiva pink, mucous membranes moist, neck supple without masses CARDIAC:  RRR, no murmur heard, no peripheral edema PULM: Normal respiratory effort, lungs CTA bilaterally, no wheezing ABDOMEN:  Nondistended, soft, nontender. No obvious masses,  normal bowel sounds SKIN:  turgor, no lesions seen Musculoskeletal:  Normal muscle tone, normal strength NEURO: Alert and oriented x 3, no focal neurologic deficits   Willette Cluster , NP 12/06/2017, 9:32 AM

## 2017-12-06 NOTE — Patient Instructions (Signed)
If you are age 66 or older, your body mass index should be between 23-30. Your Body mass index is 34.95 kg/m. If this is out of the aforementioned range listed, please consider follow up with your Primary Care Provider.  If you are age 66 or younger, your body mass index should be between 19-25. Your Body mass index is 34.95 kg/m. If this is out of the aformentioned range listed, please consider follow up with your Primary Care Provider.   We have sent the following medications to your pharmacy for you to pick up at your convenience: Lomotil Neixum  DO NOT Take Imodium if taking Lomotil.  Thank you for choosing me and [facility].   Willette ClusterPaula Guenther, NP

## 2017-12-07 ENCOUNTER — Encounter: Payer: Self-pay | Admitting: Adult Health

## 2017-12-07 ENCOUNTER — Telehealth: Payer: Self-pay

## 2017-12-07 ENCOUNTER — Ambulatory Visit (INDEPENDENT_AMBULATORY_CARE_PROVIDER_SITE_OTHER): Payer: Medicare Other | Admitting: Adult Health

## 2017-12-07 DIAGNOSIS — J4531 Mild persistent asthma with (acute) exacerbation: Secondary | ICD-10-CM | POA: Diagnosis not present

## 2017-12-07 MED ORDER — AMOXICILLIN-POT CLAVULANATE 875-125 MG PO TABS: 1.0000 | ORAL_TABLET | Freq: Two times a day (BID) | ORAL | 0 refills | Status: AC

## 2017-12-07 MED ORDER — MOMETASONE FURO-FORMOTEROL FUM 200-5 MCG/ACT IN AERO: 2.0000 | INHALATION_SPRAY | Freq: Two times a day (BID) | RESPIRATORY_TRACT | 0 refills | Status: DC

## 2017-12-07 MED ORDER — ALBUTEROL SULFATE (2.5 MG/3ML) 0.083% IN NEBU: 2.5000 mg | INHALATION_SOLUTION | RESPIRATORY_TRACT | 5 refills | Status: AC | PRN

## 2017-12-07 NOTE — Telephone Encounter (Signed)
Patient left message that she was told at last OV to f/u with neurologist within two weeks. First appt she could get is for 06/08/18. Wants to know if PCP can call to get her a sooner appt.  Call back is [phone]. Ples SpecterAlisa Brake, RN [facility] RN)

## 2017-12-07 NOTE — Progress Notes (Signed)
@Patient  ID: [name], female    DOB: 1951/06/14, 67 y.o.   MRN: [id number]  Chief Complaint  Patient presents with  . Acute Visit    Asthma    Referring provider: [name], MD  HPI: [id number]  yobf never smoker clinical dx with cough variant asthma and sarcoid here 1995 neg tbbx but classic cxr which gradually improved   Previously on chronic steroids but stopped in 2011, unable to tolerate .  PMH : A fib s/p Ablation-on Xarelto  HTN , DM   12/07/2017 Acute OV : Cough  Patient presents for an acute office visit.  Complains of 3 days of cough with thick mucus , but hard to get up . Has increased  Wheezing and  dyspnea, .  Had to use proAir few times. Last seen in office 2017 . Says doing well until last few days ago .  CXR on 11/23/17 showed no acute process.  Says she used to have albuterol nebs at home but does not have a machine any longer.  Appetite is good. No n/v/d.    Allergies  Allergen Reactions  . Prednisone Other (See Comments)    SENT PATIENT INTO A-FIB   . Amitriptyline Other (See Comments)    Made the patient disoriented  . Hydromorphone Hcl Other (See Comments)    Made the blood pressure drop (HYPOtension)    Immunization History  Administered Date(s) Administered  . Influenza, High Dose Seasonal PF 05/19/2017  . Influenza,inj,Quad PF,6+ Mos 05/02/2015, 05/13/2016  . Influenza-Unspecified 05/18/2011, 05/17/2014  . Pneumococcal Conjugate-13 09/13/2015  . Pneumococcal Polysaccharide-23 07/12/2012, 11/17/2017  . Tdap 05/22/2015    Past Medical History:  Diagnosis Date  . Anxiety   . Arthritis    "knees, hands, ankles, feet" (05/12/2016)  . Asthma    Dr. [name]  . Bipolar disorder (HCC)   . Chronic back pain    "lower and middle" (05/12/2016)  . CKD (chronic kidney disease), stage III (HCC)   . Colon polyps   . Depression   . Diverticulosis   . Epilepsy (HCC)   . Essential hypertension   . Fibromyalgia   . Gallstones   . GERD  (gastroesophageal reflux disease)   . H/O hiatal hernia   . Hyperlipidemia   . IBS (irritable bowel syndrome)   . Paroxysmal A-fib (HCC)    failed medical therapy with tikosyn, s/p AF ablation x 2  . Peripheral neuropathy   . Sarcoidosis of lung (HCC)    Dr. [name]  . Seizures (HCC)    "epileptic; pretty regular recently" (05/12/2016)  . Syncope   . Type II diabetes mellitus (HCC)     Tobacco History: Social History   Tobacco Use  Smoking Status Never Smoker  Smokeless Tobacco Never Used   Counseling given: Not Answered   Outpatient Encounter Medications as of 12/07/2017  Medication Sig  . albuterol (PROAIR HFA) 108 (90 Base) MCG/ACT inhaler inhale 2 puffs every 6 hours if needed for wheezing or shortness of breath  . amiodarone (PACERONE) 200 MG tablet Take 1 tablet (200 mg total) by mouth 2 (two) times daily.  Marland Kitchen amLODipine (NORVASC) 10 MG tablet Take 1 tablet (10 mg total) by mouth daily.  Marland Kitchen atorvastatin (LIPITOR) 80 MG tablet Take 80 mg by mouth at bedtime.  . calcium carbonate (OS-CAL) 600 MG TABS tablet Take 600 mg by mouth daily.  . cholecalciferol (VITAMIN D) 1000 UNITS tablet Take 1,000 Units by mouth 2 (two) times daily.   Marland Kitchen  Cranberry 500 MG CAPS Take 500 mg by mouth 2 (two) times daily.  . diphenoxylate-atropine (LOMOTIL) 2.5-0.025 MG tablet Take 1 tablet by mouth 2 (two) times daily as needed for diarrhea or loose stools.  . DULERA 100-5 MCG/ACT AERO inhale 2 puffs INTO THE LUNGS every 12 hours  . DULoxetine (CYMBALTA) 60 MG capsule Take 60 mg by mouth at bedtime.   Marland Kitchen esomeprazole (NEXIUM) 40 MG capsule Take 1 capsule (40 mg total) by mouth 2 (two) times daily. Take 30 minutes before breakfast and dinner.  Marland Kitchen EVZIO 0.4 MG/0.4ML SOAJ Inject 0.4 mLs as directed daily as needed (for overdose).   . fluticasone (FLONASE) 50 MCG/ACT nasal spray instill 2 sprays into each nostril once daily (Patient taking differently: Place 1 spray into both nostrils 2 (two) times daily. )  .  HUMALOG KWIKPEN 100 UNIT/ML KiwkPen Inject 18-32 Units into the skin 3 (three) times daily with meals. PER Patient home SLIDING SCALE 18 units if CBG < 200 32 units  If CBG  > 200  . Insulin Glargine (LANTUS SOLOSTAR) 100 UNIT/ML Solostar Pen Inject 40 Units into the skin at bedtime. (Patient taking differently: Inject 50 Units into the skin at bedtime. )  . loperamide (IMODIUM A-D) 2 MG tablet Take 1 tablet (2 mg total) by mouth 4 (four) times daily as needed for diarrhea or loose stools.  Marland Kitchen loratadine (CLARITIN) 10 MG tablet Take 1 tablet (10 mg total) by mouth daily.  . magnesium oxide (MAG-OX) 400 MG tablet Take 1 tablet (400 mg total) by mouth 2 (two) times daily. (Patient taking differently: Take 400 mg by mouth 2 (two) times daily. )  . nitroGLYCERIN (NITROSTAT) 0.4 MG SL tablet Place 1 tablet (0.4 mg total) under the tongue every 5 (five) minutes as needed for chest pain (do nto exceed 3 doses). (Patient taking differently: Place 0.4 mg under the tongue every 5 (five) minutes x 3 doses as needed for chest pain. )  . ondansetron (ZOFRAN) 4 MG tablet Take 1 tablet (4 mg total) by mouth every 8 (eight) hours as needed for nausea or vomiting.  Marland Kitchen oxyCODONE-acetaminophen (PERCOCET) 10-325 MG tablet Take 1 tablet by mouth 5 (five) times daily. (scheduled)   . Polyvinyl Alcohol-Povidone (REFRESH OP) Place 1 drop into both eyes 3 (three) times daily as needed (for dry eyes).   . potassium chloride (K-DUR) 10 MEQ tablet take 1 tablet by mouth once daily (Patient taking differently: Take 10 MEQ by mouth once daily)  . pregabalin (LYRICA) 100 MG capsule Take 100 mg by mouth 3 (three) times daily.  . rivaroxaban (XARELTO) 20 MG TABS tablet Take 20 mg by mouth once a day with supper (Patient taking differently: Take 20 mg by mouth daily with supper. )  . tamsulosin (FLOMAX) 0.4 MG CAPS capsule Take 0.4 mg by mouth at bedtime.  . vitamin C (ASCORBIC ACID) 500 MG tablet Take 500 mg by mouth 2 (two) times  daily.  . VOLTAREN 1 % GEL APPLY 2 INCHES TO AFFECTED AREA FOUR TIMES A DAY (Patient taking differently: APPLY 2 INCHES TO AFFECTED AREA THREE TIMES A DAY)  . amoxicillin-clavulanate (AUGMENTIN) 875-125 MG tablet Take 1 tablet by mouth 2 (two) times daily for 7 days.   No facility-administered encounter medications on file as of 12/07/2017.      Review of Systems  Constitutional:   No  weight loss, night sweats,  Fevers, chills, + fatigue, or  lassitude.  HEENT:   No headaches,  Difficulty  swallowing,  Tooth/dental problems, or  Sore throat,                No sneezing, itching, ear ache,  +nasal congestion, post nasal drip,   CV:  No chest pain,  Orthopnea, PND, swelling in lower extremities, anasarca, dizziness, palpitations, syncope.   GI  No heartburn, indigestion, abdominal pain, nausea, vomiting, diarrhea, change in bowel habits, loss of appetite, bloody stools.   Resp:   No chest wall deformity  Skin: no rash or lesions.  GU: no dysuria, change in color of urine, no urgency or frequency.  No flank pain, no hematuria   MS:  No joint pain or swelling.  No decreased range of motion.  No back pain.    Physical Exam  BP 130/80 (BP Location: Right Arm, Cuff Size: Normal)   Pulse 73   Ht 5\' 6"  (1.676 m)   Wt 216 lb (98 kg)   SpO2 97%   BMI 34.86 kg/m   GEN: A/Ox3; pleasant , NAD, obese    HEENT:  [place] Y/AT,  EACs-clear, TMs-wnl, NOSE-clear, THROAT-clear, no lesions, no postnasal drip or exudate noted.   NECK:  Supple w/ fair ROM; no JVD; normal carotid impulses w/o bruits; no thyromegaly or nodules palpated; no lymphadenopathy.    RESP  Few scattered rhonchi and trace wheezes  no accessory muscle use, no dullness to percussion , speaks in full sentences   CARD:  RRR, no m/r/g, no peripheral edema, pulses intact, no cyanosis or clubbing.  GI:   Soft & nt; nml bowel sounds; no organomegaly or masses detected.   Musco: Warm bil, no deformities or joint swelling noted.    Neuro: alert, no focal deficits noted.    Skin: Warm, no lesions or rashes    Lab Results:  CBC  BNP Imaging: Dg Chest 2 View  Result Date: 11/23/2017 CLINICAL DATA:  Chest pain. EXAM: CHEST - 2 VIEW COMPARISON:  11/22/2017. FINDINGS: Mediastinum hilar structures normal. Mild left base subsegmental atelectasis and/or scarring. No pleural effusion or pneumothorax. Heart size normal. Cervicothoracic spine effusion. IMPRESSION: Mild left base subsegmental atelectasis and/or scarring. Electronically Signed   By: Maisie Fushomas  Register   On: 11/23/2017 14:24   Dg Chest 2 View  Result Date: 11/22/2017 CLINICAL DATA:  Patient underwent ablation for atrial fibrillation 6 days ago and has been having tremors ever since with pain in the back and extending into the right leg. Patient reports syncopal episodes at home. EXAM: CHEST - 2 VIEW COMPARISON:  Chest x-ray of May 05, 2017 FINDINGS: The lungs are adequately inflated and clear. The heart is mildly enlarged. The central pulmonary vascularity is prominent. There is no definite cephalization of the vascular pattern. The mediastinum is normal in width. There is no pleural effusion. The bony thorax exhibits no acute abnormality. IMPRESSION: Mild prominence of the cardiac silhouette and central pulmonary vascularity. No pulmonary edema or pneumonia. Electronically Signed   By: David  SwazilandJordan M.D.   On: 11/22/2017 17:07   Ct Head Wo Contrast  Result Date: 11/22/2017 CLINICAL DATA:  Tremors EXAM: CT HEAD WITHOUT CONTRAST TECHNIQUE: Contiguous axial images were obtained from the base of the skull through the vertex without intravenous contrast. COMPARISON:  MRI brain 05/13/2017, CT brain 05/10/2016 FINDINGS: Brain: No evidence of acute infarction, hemorrhage, hydrocephalus, extra-axial collection or mass lesion/mass effect. Vascular: No hyperdense vessels. Scattered calcifications at the carotid siphons. Skull: Normal. Negative for fracture or focal lesion.  Sinuses/Orbits: Mild mucosal thickening in the ethmoid sinuses. No  acute orbital abnormality. Other: None IMPRESSION: 1. Negative.  No CT evidence for acute intracranial abnormality. Electronically Signed   By: Jasmine Pang M.D.   On: 11/22/2017 18:09   Mr Laqueta Jean ZO Contrast  Result Date: 11/25/2017 CLINICAL DATA:  Initial evaluation for new onset seizure. EXAM: MRI HEAD WITHOUT AND WITH CONTRAST TECHNIQUE: Multiplanar, multiecho pulse sequences of the brain and surrounding structures were obtained without and with intravenous contrast. CONTRAST:  20mL MULTIHANCE GADOBENATE DIMEGLUMINE 529 MG/ML IV SOLN COMPARISON:  Prior CT from 11/22/2017. FINDINGS: Brain: Cerebral volume within normal limits for age. No focal parenchymal signal abnormality identified. No significant cerebral white matter changes for age. No abnormal foci of restricted diffusion to suggest acute or subacute ischemia. Gray-white matter differentiation well maintained. No encephalomalacia to suggest chronic infarction. No evidence for acute or chronic intracranial hemorrhage. No mass lesion, midline shift or mass effect. No hydrocephalus. No extra-axial fluid collection. Major dural sinuses are grossly patent. Incidental note made of a small DVA within the anterior right frontal lobe. No other abnormal enhancement. Pituitary gland suprasellar region normal. Midline structures intact and normal. Vascular: Major intracranial vascular flow voids are well maintained. Skull and upper cervical spine: Craniocervical junction within normal limits. Upper cervical spine unremarkable. Bone marrow signal intensity within normal limits. No scalp soft tissue abnormality. Sinuses/Orbits: Globes and orbital soft tissues within normal limits. Paranasal sinuses are clear. No mastoid effusion. Inner ear structures normal. Other: None. IMPRESSION: Normal brain MRI for age. No acute intracranial abnormality identified. Electronically Signed   By: Rise Mu M.D.   On: 11/25/2017 02:22   Ct Cardiac Morph/pulm Vein W/cm&w/o Ca Score  Addendum Date: 11/10/2017   ADDENDUM REPORT: 11/10/2017 13:12 CLINICAL DATA:  Atrial fibrillation scheduled for an ablation. EXAM: Cardiac CT/CTA TECHNIQUE: The patient was scanned on a [employer]  scanner. FINDINGS: A 120 kV prospective scan was triggered in the descending thoracic aorta at 111 HU's. Gantry rotation speed was 280 msecs and collimation was .9 mm. No beta blockade and no NTG was given. The 3D data set was reconstructed in 5% intervals of the 60-80 % of the R-R cycle. Diastolic phases were analyzed on a dedicated work station using MPR, MIP and VRT modes. The patient received 80 cc of contrast. There was moderate biatrial enlargement. There was no ASD. There was no pericardial effusion. Aortic root was normal at 3.5 cm. There was a suggestion of LAA thrombus at tip of appendage on contrast images but delayed images were clear with no evidence of thrombus. The PV;s drained normally into the LA. The LLPV appeared stenotic with a very elliptical opening measuring less than 6 mm in minor axis plane. RUPV: Ostium 16.7 mm Area 2.14 cm2 RLPV: Ostium 13.1 mm Area 1.25 cm2 LUPV: Ostium 17.4 mm Area 1.71 cm2 LLPV: Ostium elliptical minor axis less than 6 mm post stenotic dilatation 15.9 mm Area .97 cm2 Coronary Arteries calcium noted in all 3 major epicardial vessels mostly LAD IMPRESSION: 1.  Moderate biatrial enlargement 2.  No LAA thrombus 3.  No ASD/PFO 4.  Normal PV anatomy but diminutive with stenosis of LLPV 5.  Normal aortic root 3.5 cm 6.  Esophagus courses closest to the LLPV ostium 7.  Calcium score 220 which is 92 nd percentile for age and sex Charlton Haws Electronically Signed   By: Charlton Haws M.D.   On: 11/10/2017 13:12   Result Date: 11/10/2017 EXAM: OVER-READ INTERPRETATION  CT CHEST The following report is an over-read  performed by radiologist Dr. Trudie Reed of [facility] on  11/10/2017. This over-read does not include interpretation of cardiac or coronary anatomy or pathology. The coronary calcium score/coronary CTA interpretation by the cardiologist is attached. COMPARISON:  Chest CT 08/02/2014. FINDINGS: Numerous borderline enlarged and mildly enlarged mediastinal and hilar lymph nodes, similar to the prior examination, largest of which measures 16 mm in the low right paratracheal nodal station. Within the visualized portions of the thorax there are no suspicious appearing pulmonary nodules or masses, there is no acute consolidative airspace disease, no pleural effusions and no pneumothorax. Visualized portions of the upper abdomen are unremarkable. There are no aggressive appearing lytic or blastic lesions noted in the visualized portions of the skeleton. IMPRESSION: 1. Mediastinal and bilateral hilar lymphadenopathy, similar to the prior examination, compatible with the patient's known history of sarcoidosis. Electronically Signed: By: Trudie Reed M.D. On: 11/10/2017 12:35     Assessment & Plan:   Asthma Acute exacerbation with bronchitis /AR flare  xopenexx neb x 1  Unable to take steroids   Plan  Patient Instructions  Begin Augmentin 875mg  Twice daily  For 7 days  Mucinex DM Twice daily  As needed  Cough /congestion .  Saline nasal rinses As needed   May use proAir or Albuterol Neb every 4hrs as needed.  Increase Dulera 200 2 puffs Twice daily  , rinse after use. -until sample is gone then resume 100mg  dose .  Follow up with Dr. [name]  In 2-3 weeks and As needed   Please contact office for sooner follow up if symptoms do not improve or worsen or seek emergency care          Rubye Oaks, NP 12/07/2017

## 2017-12-07 NOTE — Addendum Note (Signed)
Addended by: Boone MasterJONES, JESSICA E on: 12/07/2017 12:25 PM   Modules accepted: Orders

## 2017-12-07 NOTE — Assessment & Plan Note (Addendum)
Acute exacerbation with bronchitis /AR flare  xopenexx neb x 1  Unable to take steroids   Plan  Patient Instructions  Begin Augmentin 875mg  Twice daily  For 7 days  Mucinex DM Twice daily  As needed  Cough /congestion .  Saline nasal rinses As needed   May use proAir or Albuterol Neb every 4hrs as needed.  Increase Dulera 200 2 puffs Twice daily  , rinse after use. -until sample is gone then resume 100mg  dose .  Follow up with Dr. Sherene SiresWert  In 2-3 weeks and As needed   Please contact office for sooner follow up if symptoms do not improve or worsen or seek emergency care

## 2017-12-07 NOTE — Patient Instructions (Addendum)
Begin Augmentin 875mg  Twice daily  For 7 days  Mucinex DM Twice daily  As needed  Cough /congestion .  Saline nasal rinses As needed   May use proAir or Albuterol Neb every 4hrs as needed.  Increase Dulera 200 2 puffs Twice daily  , rinse after use. -until sample is gone then resume 100mg  dose .  Follow up with Dr. Sherene SiresWert  In 2-3 weeks and As needed   Please contact office for sooner follow up if symptoms do not improve or worsen or seek emergency care

## 2017-12-07 NOTE — Progress Notes (Signed)
Chart and office note reviewed in detail  > agree with a/p as outlined    

## 2017-12-13 NOTE — Telephone Encounter (Signed)
Patient has left another message stating she is unsure what to do since she cannot see neurologist until October.   Call back is [phone].  Ples Specter, RN [facility] RN)

## 2017-12-14 NOTE — Telephone Encounter (Addendum)
Unable to get patient in sooner unfortunately as this is consider non-urgent by neurology. Would have patient keep appointment for October and if she has another episode of syncope be can try to get her in sooner.

## 2017-12-15 NOTE — Telephone Encounter (Signed)
Pt informed. Deseree Blount, CMA  

## 2017-12-16 ENCOUNTER — Ambulatory Visit (HOSPITAL_COMMUNITY): Payer: Medicare Other | Admitting: Nurse Practitioner

## 2017-12-21 ENCOUNTER — Other Ambulatory Visit: Payer: Self-pay | Admitting: Family Medicine

## 2017-12-21 ENCOUNTER — Ambulatory Visit (INDEPENDENT_AMBULATORY_CARE_PROVIDER_SITE_OTHER): Payer: Medicare Other | Admitting: Pulmonary Disease

## 2017-12-21 ENCOUNTER — Other Ambulatory Visit: Payer: Self-pay | Admitting: Internal Medicine

## 2017-12-21 ENCOUNTER — Encounter: Payer: Self-pay | Admitting: Pulmonary Disease

## 2017-12-21 DIAGNOSIS — I4891 Unspecified atrial fibrillation: Secondary | ICD-10-CM

## 2017-12-21 DIAGNOSIS — K580 Irritable bowel syndrome with diarrhea: Secondary | ICD-10-CM | POA: Diagnosis not present

## 2017-12-21 DIAGNOSIS — J4530 Mild persistent asthma, uncomplicated: Secondary | ICD-10-CM

## 2017-12-21 DIAGNOSIS — R05 Cough: Secondary | ICD-10-CM

## 2017-12-21 DIAGNOSIS — T464X5A Adverse effect of angiotensin-converting-enzyme inhibitors, initial encounter: Secondary | ICD-10-CM

## 2017-12-21 NOTE — Assessment & Plan Note (Signed)
S/p 11/16/17 Ablation  Continue follow up with Cardiology  Continue Xarelto and Amio

## 2017-12-21 NOTE — Assessment & Plan Note (Addendum)
Improved today in office No mucus nonproductive cough Clear anterior and posteriorly Follow-up in 4 to 6 weeks Monitor SABA use  Educated on follow up

## 2017-12-21 NOTE — Progress Notes (Signed)
  ID: [name], female    DOB: [birth year], 67 y.o.   MRN: [id number]  Chief Complaint  Patient presents with  . Follow-up    Asthma / Bronchitis     Referring provider: [name], MD  HPI:  66  yo female never smoker clinical dx with cough variant asthma and sarcoid here 1995 neg tbbx but classic cxr which gradually improved   Previously on chronic steroids but stopped in 2011, unable to tolerate .  PMH : A fib s/p Ablation (11/16/17)-on Xarelto and Amio,  HTN , DM, Sarcoid  12/21/17 Arriving follow-up appointment today was last seen on 12/07/2017 with NP.  Was in an acute exacerbation and treated for bronchitis as well as allergic rhinitis flare.  Was continued on Dulera 200 puffs twice (daily rinse after use) until sample is gone and then resume 100 mg dose.  Ensure completed Augmentin 875 mg prescription for 7 days.    Recently was started on lisinopril 5 mg daily on 12/17/17 by endocrinology for renal protection as presented with an increased cough with this prescription.  Patient has concerns if the lisinopril has increased her coughing.   Patient reports feeling much better from 4/23 appointment.  Patient states the cough is no longer productive and not feeling as fatigued or short of breath.  Patient does have dry cough as of 3 days ago when starting lisinopril.  Using [facility] twice a day as instructed. Typically using SABA 2x a day.   Last ED admission was on 11/22/2017 for chronic pain.   Allergy to prednisone.  Last chest x-ray was 11/23/2017.  Last pulmonary function tests November 2016.  Up to date with vaccinations.  Allergies  Allergen Reactions  . Prednisone Other (See Comments)    SENT PATIENT INTO A-FIB   . Amitriptyline Other (See Comments)    Made the patient disoriented  . Hydromorphone Hcl Other (See Comments)    Made the blood pressure drop (HYPOtension)  . Lisinopril Cough    Immunization History  Administered Date(s) Administered    . Influenza, High Dose Seasonal PF 05/19/2017  . Influenza,inj,Quad PF,6+ Mos 05/02/2015, 05/13/2016  . Influenza-Unspecified 05/18/2011, 05/17/2014  . Pneumococcal Conjugate-13 09/13/2015  . Pneumococcal Polysaccharide-23 07/12/2012, 11/17/2017  . Tdap 05/22/2015    Past Medical History:  Diagnosis Date  . Anxiety   . Arthritis    "knees, hands, ankles, feet" (05/12/2016)  . Asthma    Dr. [name]  . Bipolar disorder (HCC)   . Chronic back pain    "lower and middle" (05/12/2016)  . CKD (chronic kidney disease), stage III (HCC)   . Colon polyps   . Depression   . Diverticulosis   . Epilepsy (HCC)   . Essential hypertension   . Fibromyalgia   . Gallstones   . GERD (gastroesophageal reflux disease)   . H/O hiatal hernia   . Hyperlipidemia   . IBS (irritable bowel syndrome)   . Paroxysmal A-fib (HCC)    failed medical therapy with tikosyn, s/p AF ablation x 2  . Peripheral neuropathy   . Sarcoidosis of lung (HCC)    Dr. [name]  . Seizures (HCC)    "epileptic; pretty regular recently" (05/12/2016)  . Syncope   . Type II diabetes mellitus (HCC)     Tobacco History: Social History   Tobacco Use  Smoking Status Never Smoker  Smokeless Tobacco Never Used   Counseling given: Not Answered   Outpatient Encounter Medications as of 12/21/2017  Medication Sig  .  albuterol (PROAIR HFA) 108 (90 Base) MCG/ACT inhaler inhale 2 puffs every 6 hours if needed for wheezing or shortness of breath  . albuterol (PROVENTIL) (2.5 MG/3ML) 0.083% nebulizer solution Take 3 mLs (2.5 mg total) by nebulization every 4 (four) hours as needed for wheezing or shortness of breath (dx: J45.31).  Marland Kitchen amiodarone (PACERONE) 200 MG tablet Take 1 tablet (200 mg total) by mouth 2 (two) times daily.  Marland Kitchen amLODipine (NORVASC) 10 MG tablet TAKE 1 TABLET BY MOUTH EVERY DAY  . atorvastatin (LIPITOR) 80 MG tablet Take 80 mg by mouth at bedtime.  . calcium carbonate (OS-CAL) 600 MG TABS tablet Take 600 mg by mouth daily.   . cholecalciferol (VITAMIN D) 1000 UNITS tablet Take 1,000 Units by mouth 2 (two) times daily.   . Cranberry 500 MG CAPS Take 500 mg by mouth 2 (two) times daily.  . diphenoxylate-atropine (LOMOTIL) 2.5-0.025 MG tablet Take 1 tablet by mouth 2 (two) times daily as needed for diarrhea or loose stools.  . DULERA 100-5 MCG/ACT AERO inhale 2 puffs INTO THE LUNGS every 12 hours  . DULoxetine (CYMBALTA) 60 MG capsule Take 60 mg by mouth at bedtime.   Marland Kitchen esomeprazole (NEXIUM) 40 MG capsule Take 1 capsule (40 mg total) by mouth 2 (two) times daily. Take 30 minutes before breakfast and dinner.  Marland Kitchen EVZIO 0.4 MG/0.4ML SOAJ Inject 0.4 mLs as directed daily as needed (for overdose).   . fluticasone (FLONASE) 50 MCG/ACT nasal spray instill 2 sprays into each nostril once daily (Patient taking differently: Place 1 spray into both nostrils 2 (two) times daily. )  . HUMALOG KWIKPEN 100 UNIT/ML KiwkPen Inject 18-32 Units into the skin 3 (three) times daily with meals. PER Patient home SLIDING SCALE 18 units if CBG < 200 32 units  If CBG  > 200  . Insulin Glargine (LANTUS SOLOSTAR) 100 UNIT/ML Solostar Pen Inject 40 Units into the skin at bedtime. (Patient taking differently: Inject 50 Units into the skin at bedtime. )  . lisinopril (PRINIVIL,ZESTRIL) 5 MG tablet Take 5 mg by mouth daily.  Marland Kitchen loperamide (IMODIUM A-D) 2 MG tablet Take 1 tablet (2 mg total) by mouth 4 (four) times daily as needed for diarrhea or loose stools.  Marland Kitchen loratadine (CLARITIN) 10 MG tablet Take 1 tablet (10 mg total) by mouth daily.  . magnesium oxide (MAG-OX) 400 MG tablet Take 1 tablet (400 mg total) by mouth 2 (two) times daily. (Patient taking differently: Take 400 mg by mouth 2 (two) times daily. )  . mometasone-formoterol (DULERA) 200-5 MCG/ACT AERO Inhale 2 puffs into the lungs 2 (two) times daily. For 2 weeks, then resume your Dulera 100  . nitroGLYCERIN (NITROSTAT) 0.4 MG SL tablet Place 1 tablet (0.4 mg total) under the tongue every 5  (five) minutes as needed for chest pain (do nto exceed 3 doses). (Patient taking differently: Place 0.4 mg under the tongue every 5 (five) minutes x 3 doses as needed for chest pain. )  . ondansetron (ZOFRAN) 4 MG tablet Take 1 tablet (4 mg total) by mouth every 8 (eight) hours as needed for nausea or vomiting.  Marland Kitchen oxyCODONE-acetaminophen (PERCOCET) 10-325 MG tablet Take 1 tablet by mouth 5 (five) times daily. (scheduled)   . Polyvinyl Alcohol-Povidone (REFRESH OP) Place 1 drop into both eyes 3 (three) times daily as needed (for dry eyes).   . potassium chloride (K-DUR) 10 MEQ tablet take 1 tablet by mouth once daily (Patient taking differently: Take 10 MEQ by mouth once  daily)  . pregabalin (LYRICA) 100 MG capsule Take 100 mg by mouth 3 (three) times daily.  . rivaroxaban (XARELTO) 20 MG TABS tablet Take 20 mg by mouth once a day with supper (Patient taking differently: Take 20 mg by mouth daily with supper. )  . tamsulosin (FLOMAX) 0.4 MG CAPS capsule Take 0.4 mg by mouth at bedtime.  . vitamin C (ASCORBIC ACID) 500 MG tablet Take 500 mg by mouth 2 (two) times daily.  . VOLTAREN 1 % GEL APPLY 2 INCHES TO AFFECTED AREA FOUR TIMES A DAY (Patient taking differently: APPLY 2 INCHES TO AFFECTED AREA THREE TIMES A DAY)   No facility-administered encounter medications on file as of 12/21/2017.      Review of Systems  Constitutional:   No  weight loss, night sweats,  Fevers, chills, fatigue, or  lassitude.  HEENT:   No headaches,  Difficulty swallowing,  Tooth/dental problems, or  Sore throat, No sneezing, itching, ear ache, nasal congestion, post nasal drip,   CV:  +heart racing with SABA use, No chest pain,  Orthopnea, PND, swelling in lower extremities, anasarca, dizziness, palpitations, syncope.   GI:  +diarrhea, No heartburn, indigestion, abdominal pain, nausea, vomiting, change in bowel habits, loss of appetite, bloody stools.   Resp: +non productive cough, +irritated throat since starting  Lisinopril, No shortness of breath with exertion or at rest.  No excess mucus, no productive cough,  No non-productive cough,  No coughing up of blood.  No change in color of mucus.  No wheezing.  No chest wall deformity  Skin: no rash or lesions.  GU: no dysuria, change in color of urine, no urgency or frequency.    MS:  No decreased range of motion.  No back pain.    Physical Exam  BP 140/80 (BP Location: Left Arm, Cuff Size: Normal)   Pulse 69   Ht  (1.676 m)   Wt 215 lb (97.5 kg)   SpO2 99%   BMI 34.70 kg/m   GEN: A/Ox3; pleasant , NAD, well nourished, appears stated age     HEENT:  [place]/AT,  EACs-clear with moderate cerumen build up, TMs-wnl, NOSE- erythematous, THROAT-clear, no lesions, no postnasal drip or exudate noted.   NECK:  Supple w/ fair ROM; no JVD; no thyromegaly or nodules palpated; no lymphadenopathy.    RESP  Clear  P & A; w/o, wheezes/ rales/ or rhonchi. no accessory muscle use  CARD:  RRR, no m/r/g, no peripheral edema, pulses intact, no cyanosis or clubbing.  GI:   Soft & nt; +hyperactive bowel sounds; no organomegaly or masses detected.   Musco: Warm bil, no deformities or joint swelling noted.   Neuro: alert, no focal deficits noted.    Skin: Warm, no lesions or rashes   Lab Results:  CBC    Component Value Date/Time   WBC 4.2 11/25/2017 0448   RBC 4.06 11/25/2017 0448   HGB 11.7 (L) 11/25/2017 0448   HGB 13.3 11/05/2017 1051   HCT 36.9 11/25/2017 0448   HCT 39.8 11/05/2017 1051   PLT 166 11/25/2017 0448   PLT 198 11/05/2017 1051   MCV 90.9 11/25/2017 0448   MCV 87 11/05/2017 1051   MCH 28.8 11/25/2017 0448   MCHC 31.7 11/25/2017 0448   RDW 14.6 11/25/2017 0448   RDW 14.3 11/05/2017 1051   LYMPHSABS 1.4 11/05/2017 1051   MONOABS 0.3 10/07/2017 1438   EOSABS 0.1 11/05/2017 1051   BASOSABS 0.0 11/05/2017 1051    BMET  Component Value Date/Time   NA 136 11/25/2017 0448   NA 138 11/05/2017 1051   K 4.4 11/25/2017 0448   CL  101 11/25/2017 0448   CO2 25 11/25/2017 0448   GLUCOSE 195 (H) 11/25/2017 0448   BUN 16 11/25/2017 0448   BUN 17 11/05/2017 1051   CREATININE 1.51 (H) 11/25/2017 0448   CREATININE 1.23 (H) 10/21/2016 1133   CALCIUM 9.1 11/25/2017 0448   GFRNONAA 35 (L) 11/25/2017 0448   GFRNONAA 46 (L) 10/21/2016 1133   GFRAA 41 (L) 11/25/2017 0448   GFRAA 54 (L) 10/21/2016 1133    BNP    Component Value Date/Time   BNP 196.8 (H) 09/05/2014 1636    ProBNP    Component Value Date/Time   PROBNP 617.3 (H) 08/02/2014 0617    Imaging: Dg Chest 2 View  Result Date: 11/23/2017 CLINICAL DATA:  Chest pain. EXAM: CHEST - 2 VIEW COMPARISON:  11/22/2017. FINDINGS: Mediastinum hilar structures normal. Mild left base subsegmental atelectasis and/or scarring. No pleural effusion or pneumothorax. Heart size normal. Cervicothoracic spine effusion. IMPRESSION: Mild left base subsegmental atelectasis and/or scarring. Electronically Signed   By: Maisie Fus  Register   On: 11/23/2017 14:24   Dg Chest 2 View  Result Date: 11/22/2017 CLINICAL DATA:  Patient underwent ablation for atrial fibrillation 6 days ago and has been having tremors ever since with pain in the back and extending into the right leg. Patient reports syncopal episodes at home. EXAM: CHEST - 2 VIEW COMPARISON:  Chest x-ray of May 05, 2017 FINDINGS: The lungs are adequately inflated and clear. The heart is mildly enlarged. The central pulmonary vascularity is prominent. There is no definite cephalization of the vascular pattern. The mediastinum is normal in width. There is no pleural effusion. The bony thorax exhibits no acute abnormality. IMPRESSION: Mild prominence of the cardiac silhouette and central pulmonary vascularity. No pulmonary edema or pneumonia. Electronically Signed   By: David  [place] M.D.   On: 11/22/2017 17:07   Ct Head Wo Contrast  Result Date: 11/22/2017 CLINICAL DATA:  Tremors EXAM: CT HEAD WITHOUT CONTRAST TECHNIQUE: Contiguous  axial images were obtained from the base of the skull through the vertex without intravenous contrast. COMPARISON:  MRI brain 05/13/2017, CT brain 05/10/2016 FINDINGS: Brain: No evidence of acute infarction, hemorrhage, hydrocephalus, extra-axial collection or mass lesion/mass effect. Vascular: No hyperdense vessels. Scattered calcifications at the carotid siphons. Skull: Normal. Negative for fracture or focal lesion. Sinuses/Orbits: Mild mucosal thickening in the ethmoid sinuses. No acute orbital abnormality. Other: None IMPRESSION: 1. Negative.  No CT evidence for acute intracranial abnormality. Electronically Signed   By: Jasmine Pang M.D.   On: 11/22/2017 18:09   Mr Laqueta Jean ZO Contrast  Result Date: 11/25/2017 CLINICAL DATA:  Initial evaluation for new onset seizure. EXAM: MRI HEAD WITHOUT AND WITH CONTRAST TECHNIQUE: Multiplanar, multiecho pulse sequences of the brain and surrounding structures were obtained without and with intravenous contrast. CONTRAST:  20mL MULTIHANCE GADOBENATE DIMEGLUMINE 529 MG/ML IV SOLN COMPARISON:  Prior CT from 11/22/2017. FINDINGS: Brain: Cerebral volume within normal limits for age. No focal parenchymal signal abnormality identified. No significant cerebral white matter changes for age. No abnormal foci of restricted diffusion to suggest acute or subacute ischemia. Gray-white matter differentiation well maintained. No encephalomalacia to suggest chronic infarction. No evidence for acute or chronic intracranial hemorrhage. No mass lesion, midline shift or mass effect. No hydrocephalus. No extra-axial fluid collection. Major dural sinuses are grossly patent. Incidental note made of a small DVA within  the anterior right frontal lobe. No other abnormal enhancement. Pituitary gland suprasellar region normal. Midline structures intact and normal. Vascular: Major intracranial vascular flow voids are well maintained. Skull and upper cervical spine: Craniocervical junction within  normal limits. Upper cervical spine unremarkable. Bone marrow signal intensity within normal limits. No scalp soft tissue abnormality. Sinuses/Orbits: Globes and orbital soft tissues within normal limits. Paranasal sinuses are clear. No mastoid effusion. Inner ear structures normal. Other: None. IMPRESSION: Normal brain MRI for age. No acute intracranial abnormality identified. Electronically Signed   By: Rise Mu M.D.   On: 11/25/2017 02:22     Assessment & Plan:   Asthma Improved today in office No mucus nonproductive cough Clear anterior and posteriorly Follow-up in 4 to 6 weeks Monitor SABA use  Educated on follow up   Atrial fibrillation (HCC) S/p 11/16/17 Ablation  Continue follow up with Cardiology  Continue Xarelto and Amio   Irritable bowel syndrome Stable  Continue Lomotil per GI instructions   Cough due to Ace Inhibitor  Hold lisinopril now  Contact endocrinology to discuss cough symptoms and need for new arm Contact primary care if endocrinology does not follow-up in the next 24 to 48 hours Contact pulmonary if endocrinology and primary care unresponsive to patient calls   Coral Ceo, NP 12/21/2017

## 2017-12-21 NOTE — Patient Instructions (Addendum)
Follow-up in 4 to 6 weeks with Elisha Headland FNP-C Continue Dulera 200 puffs twice daily with daily rinse until same is finised, then go back to [facility] 100 Call Endocrinology today to report cough and scratchy throat since starting lisinopril 12/17/17 Delsym 2 tsp a day for cough as needed for cough  Call office if cough doesn't improve with these measures Follow-up with primary care if Endocrinology does not respond quickly Hold lisinopril until discussing with Dr. Sharl Ma or primary care due to side effects  Please contact the office if your symptoms worsen or you have concerns that you are not improving.   Thank you for choosing  [facility] for your healthcare, and for allowing [place] to partner with you on your healthcare journey. I am thankful to be able to provide care to you today.   Elisha Headland FNP-C

## 2017-12-21 NOTE — Assessment & Plan Note (Signed)
Stable  Continue Lomotil per GI instructions

## 2017-12-22 ENCOUNTER — Ambulatory Visit: Payer: Medicare Other | Attending: Internal Medicine

## 2017-12-22 ENCOUNTER — Other Ambulatory Visit: Payer: Self-pay | Admitting: Internal Medicine

## 2017-12-22 ENCOUNTER — Other Ambulatory Visit: Payer: Self-pay

## 2017-12-22 ENCOUNTER — Telehealth: Payer: Self-pay | Admitting: Internal Medicine

## 2017-12-22 DIAGNOSIS — R2689 Other abnormalities of gait and mobility: Secondary | ICD-10-CM | POA: Insufficient documentation

## 2017-12-22 DIAGNOSIS — M6281 Muscle weakness (generalized): Secondary | ICD-10-CM

## 2017-12-22 DIAGNOSIS — R293 Abnormal posture: Secondary | ICD-10-CM

## 2017-12-22 NOTE — Therapy (Signed)
[facility] [address] [place], [place], [address] Phone: [phone]   Fax:  [phone]  Physical Therapy Evaluation  Patient Details  Name: [name] MRN: [id number] Date of Birth: January 18, 1951 Referring Provider: Dr. Cathlean Cower  (Dr. Marjory Lies (neurologist) can't get in until 05/2018)   Encounter Date: 12/22/2017  PT End of Session - 12/22/17 1624    Visit Number  1    Number of Visits  9    Date for PT Re-Evaluation  02/20/18    Authorization Type  [employer] Medicare primary and Medicaid secondary    PT Start Time  1536    PT Stop Time  1615    PT Time Calculation (min)  39 min    Equipment Utilized During Treatment  -- S prn    Activity Tolerance  Patient tolerated treatment well    Behavior During Therapy  [facility] for tasks assessed/performed       Past Medical History:  Diagnosis Date  . Anxiety   . Arthritis    "knees, hands, ankles, feet" (05/12/2016)  . Asthma    Dr. [name]  . Bipolar disorder (HCC)   . Chronic back pain    "lower and middle" (05/12/2016)  . CKD (chronic kidney disease), stage III (HCC)   . Colon polyps   . Depression   . Diverticulosis   . Epilepsy (HCC)   . Essential hypertension   . Fibromyalgia   . Gallstones   . GERD (gastroesophageal reflux disease)   . H/O hiatal hernia   . Hyperlipidemia   . IBS (irritable bowel syndrome)   . Paroxysmal A-fib (HCC)    failed medical therapy with tikosyn, s/p AF ablation x 2  . Peripheral neuropathy   . Sarcoidosis of lung (HCC)    Dr. [name]  . Seizures (HCC)    "epileptic; pretty regular recently" (05/12/2016)  . Syncope   . Type II diabetes mellitus (HCC)     Past Surgical History:  Procedure Laterality Date  . ABDOMINAL HYSTERECTOMY  1995  . ANTERIOR CERVICAL DECOMP/DISCECTOMY FUSION  07/11/2012   Procedure: ANTERIOR CERVICAL DECOMPRESSION/DISCECTOMY FUSION 1 LEVEL;  Surgeon: [name], MD;  Location: [facility] NEURO ORS;  Service:  Neurosurgery;  Laterality: N/A;  Cervical Five-Six Anterior Cervical Decompression with Fusion Interbody Prothesis Plating and Bonegraft  . ATRIAL FIBRILLATION ABLATION N/A 05/18/2017   Procedure: Atrial Fibrillation Ablation;  Surgeon: [name], MD;  Location: [facility] INVASIVE CV LAB;  Service: Cardiovascular;  Laterality: N/A;  . ATRIAL FIBRILLATION ABLATION N/A 11/16/2017   Procedure: ATRIAL FIBRILLATION ABLATION;  Surgeon: [name], MD;  Location: [facility] INVASIVE CV LAB;  Service: Cardiovascular;  Laterality: N/A;  . CARDIAC CATHETERIZATION  2012   a. Normal coronaries 2012. b. Normal nuc 09/2014.  Marland Kitchen CARDIAC CATHETERIZATION  05/18/2017  . CHOLECYSTECTOMY OPEN  1976  . CLOSED REDUCTION ANKLE FRACTURE Left 10/2006   "got steel rod in my leg; and screws"  . COLON SURGERY    . DILATION AND CURETTAGE OF UTERUS    . ESOPHAGEAL MANOMETRY  03/21/2012   Procedure: ESOPHAGEAL MANOMETRY (EM);  Surgeon: [name], MD;  Location: [facility] ENDOSCOPY;  Service: Endoscopy;  Laterality: N/A;  . FRACTURE SURGERY    . NEUROPLASTY / TRANSPOSITION MEDIAN NERVE AT CARPAL TUNNEL Left 2004  . NEUROPLASTY / TRANSPOSITION MEDIAN NERVE AT CARPAL TUNNEL Right 2002  . 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 SALPINGOOPHORECTOMY Bilateral 2000  . TEE WITHOUT CARDIOVERSION N/A 05/18/2017  Procedure: TRANSESOPHAGEAL ECHOCARDIOGRAM (TEE);  Surgeon: [name], MD;  Location: [facility] ENDOSCOPY;  Service: Cardiovascular;  Laterality: N/A;  . TUBAL LIGATION  1982    There were no vitals filed for this visit.   Subjective Assessment - 12/22/17 1546    Subjective  Pt recently hospitalized 11/2017 for syncopal episodes with possible seizure activities. Pt reports she has passed out twice at her dtr's house, and has fallen about 3 times in the last 6 months. Pt utilizes SBQC or RW to amb. for safety. Pt becomes short-winded easily (even from lobby to exam room), and feels tired.     Pertinent History  HTN, syncope,  orthostatic hypotension, a-fib, asthma, bipolar, anxiety, depression, DM, seizures, sarcoidosis of lungs, CKD, HLD, chronic back pain, arthritis, fibromyalgia, peripheral neuropathy (B UE/LE)    Patient Stated Goals  Try to get some strength in my body, improve balance, walk longer distances    Currently in Pain?  Yes    Pain Score  6     Pain Location  Back    Pain Orientation  Upper;Lower    Pain Descriptors / Indicators  Aching    Pain Radiating Towards  BLEs    Pain Onset  More than a month ago    Pain Frequency  Constant    Aggravating Factors   Unsure    Pain Relieving Factors  pain medication         [facility] PT Assessment - 12/22/17 1553      Assessment   Medical Diagnosis  Gait abnomality    Referring Provider  Dr. Cathlean Cower  Dr. Marjory Lies (neurologist) can't get in until 05/2018    Onset Date/Surgical Date  11/22/17    Hand Dominance  Right    Prior Therapy  Acute care PT and OP ortho      Precautions   Precautions  Fall;Other (comment) pt's heart in rhythm now (2 ablation procedures)      Restrictions   Weight Bearing Restrictions  No      Balance Screen   Has the patient fallen in the past 6 months  Yes    How many times?  3    Has the patient had a decrease in activity level because of a fear of falling?   Yes    Is the patient reluctant to leave their home because of a fear of falling?   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 light touch on RUE and RLE. BUE/LE N/T  ROM / Strength   AROM / PROM / Strength  AROM;Strength      AROM   Overall AROM   Within functional limits for tasks performed    Overall AROM Comments  BUE/LE AROM WNL      Strength   Overall Strength  Deficits    Overall Strength Comments  B UE WNL. B hip flex: 4/5, knee ext: 4+/5, knee flex: 4/5, ankle DF: 4/5. B hip abd/add: 4/5. Pt reported pain with MMT.      Ambulation/Gait   Ambulation/Gait  Yes    Ambulation/Gait Assistance  5: Supervision    Ambulation/Gait Assistance Details  S for safety.    Ambulation Distance (Feet)  100 Feet    Assistive device  Small based quad cane    Gait Pattern  Step-through pattern;Decreased stride length;Decreased weight shift to right;Decreased stance time - left;Antalgic    Ambulation Surface  Level;Indoor    Gait velocity  1.34ft/sec. with [facility]      Standardized Balance Assessment   Standardized Balance Assessment  Timed Up and Go Test      Timed Up and Go Test   TUG  Normal TUG    Normal TUG (seconds)  16.38 with SBQC                Objective measurements completed on examination: See above findings.              PT Education - 12/22/17 1623    Education provided  Yes    Education Details  PT discussed outcome measures, POC, frequency, and duration.     Person(s) Educated  Patient;Child(ren) pt's dtr at the end of the session (in lobby for most of session)    Methods  Explanation    Comprehension  Verbalized understanding       PT Short Term Goals - 12/22/17 1632      PT SHORT TERM GOAL #1   Title  same as LTGs        PT Long Term Goals - 12/22/17 1632      PT LONG TERM GOAL #1   Title  Pt will be IND with HEP to improve balance,  endurance, and strength. TARGET DATE FOR ALL LTGS: 01/19/18    Status  New      PT LONG TERM GOAL #2   Title  Pt will improve TUG time with LRAD to </=13.5 sec. to decr. falls risk.     Status  New      PT LONG TERM GOAL #3   Title  Pt will improve gait speed to >/=2.52ft/sec. to decr. falls risk and safely amb. in the community with LRAD.    Status  New      PT LONG TERM GOAL #4   Title  Pt will amb. 500' over even/paved surfaces at MOD I level with LRAD to improve functional mobility.     Status  New      PT LONG TERM GOAL #5   Title  Perform DGI and write goal as indicated.     Status  New             Plan - 12/22/17 1625    Clinical Impression Statement  Pt is a pleasant 66y/o female presenting to [facility] neuro for gait abnormality. Pt's PMH significant for the following: HTN, syncope, orthostatic hypotension, a-fib, asthma, bipolar, anxiety, depression, DM, seizures, sarcoidosis of lungs, CKD, HLD, chronic back pain, arthritis, fibromyalgia, peripheral neuropathy (B UE/LE). Pt's gait  speed and TUG time indicate pt is at a high risk for falls. The following impairments were noted upon exam: gait deviations, impaired balance, decr. strength, decr. flexibility, postural dysfunction, and impaired sensation. Unable to perform DGI after TUG and amb. 2/2 incr. back/LE pain, will test next visit. Pt also reported pain, which we will not directly treat but monitor closely. Pt would benefit from skilled PT to improve safety during functional mobility.     History and Personal Factors relevant to plan of care:  Pt's grandson lives with pt and she requires family assist now for most activities, unable to work    Clinical Presentation  Evolving    Clinical Presentation due to:  HTN, syncope, orthostatic hypotension, a-fib, asthma, bipolar, anxiety, depression, DM, seizures, sarcoidosis of lungs, CKD, HLD, chronic back pain, arthritis, fibromyalgia, peripheral neuropathy (B UE/LE)    Clinical  Decision Making  Moderate    Rehab Potential  Fair    Clinical Impairments Affecting Rehab Potential  see above.     PT Frequency  2x / week    PT Duration  4 weeks    PT Treatment/Interventions  ADLs/Self Care Home Management;Biofeedback;Therapeutic activities;Therapeutic exercise;Manual techniques;Vestibular;Orthotic Fit/Training;Functional mobility training;Stair training;Gait training;DME Instruction;Neuromuscular re-education;Balance training;Patient/family education    PT Next Visit Plan  Perform DGI and and write goal as indicated. Initiate HEP    Consulted and Agree with Plan of Care  Patient       Patient will benefit from skilled therapeutic intervention in order to improve the following deficits and impairments:  Abnormal gait, Decreased endurance, Decreased knowledge of use of DME, Decreased strength, Decreased balance, Decreased mobility, Pain, Impaired flexibility, Postural dysfunction, Impaired sensation  Visit Diagnosis: Other abnormalities of gait and mobility - Plan: PT plan of care cert/re-cert  Abnormal posture - Plan: PT plan of care cert/re-cert  Muscle weakness (generalized) - Plan: PT plan of care cert/re-cert     Problem List Patient Active Problem List   Diagnosis Date Noted  . Cough due to ACE inhibitor 12/21/2017  . Gait abnormality 11/30/2017  . Cardiac syncope 11/22/2017  . Type II diabetes mellitus (HCC)   . Bipolar disorder (HCC)   . Atrial fibrillation (HCC)   . Chronic back pain   . CKD (chronic kidney disease), stage III (HCC)   . Depression   . Anxiety   . Paroxysmal A-fib (HCC)   . Diarrhea 09/03/2017  . Estrogen deficiency 06/07/2017  . Healthcare maintenance 06/03/2017  . Paroxysmal atrial fibrillation (HCC) 05/18/2017  . A-fib (HCC) 05/05/2017  . Back pain 12/11/2016  . Pelvic pain 11/25/2016  . Chronic pain 09/08/2016  . Spells of decreased attentiveness 09/08/2016  . Persistent atrial fibrillation (HCC)   . Irritable bowel  syndrome 03/03/2016  . Chronic leukopenia 09/13/2015  . PAF (paroxysmal atrial fibrillation) (HCC) 08/22/2015  . Morbid obesity (HCC) 07/11/2015  . Bipolar I disorder (HCC) 05/22/2015  . Chronic kidney disease (CKD), stage III (moderate) (HCC) 05/14/2015  . Insulin dependent diabetes mellitus (HCC)   . Seizure disorder (HCC)   . History of diverticulitis   . Syncope 06/29/2012  . Orthostatic hypotension 06/29/2012  . History of colonic polyps 09/01/2011  . Sarcoid (HCC) 09/01/2011  . Asthma 09/01/2011  . GERD (gastroesophageal reflux disease) 09/01/2011  . 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 Diabetes mellitus type 2, uncontrolled, with complications (HCC) 11/19/2008  . Hyperlipidemia 11/19/2008  . Essential hypertension 11/19/2008    Kelechi Astarita L 12/22/2017, 4:38 PM  [facility] [phone]  [address] [place], [place], [address] Phone: [phone]   Fax:  [phone]  Name: [name] MRN: [id number] Date of Birth: [birth year]  Zerita Boers, PT,DPT 12/22/17 4:38 PM Phone: [phone] Fax: [phone]

## 2017-12-23 ENCOUNTER — Encounter (HOSPITAL_COMMUNITY): Payer: Self-pay

## 2017-12-23 NOTE — Telephone Encounter (Signed)
Patient seen by TP 5.7.19 Patient would like to switch providers from MW to Dr Isaiah Serge  Dr [name] please advise, thank you

## 2017-12-24 NOTE — Telephone Encounter (Signed)
Fine with me

## 2017-12-24 NOTE — Telephone Encounter (Signed)
Pt's upcoming appt is scheduled with Elisha Headland, NP.  Attempted to call pt to see if she wanted to still have that appt with Arlys John, NP or if she wanted to go ahead and switch that appt to see Dr. Isaiah Serge as MW is fine with pt switching to Dr. Isaiah Serge.  Left message for pt to return call so that way we can address this with her when she calls back.  Pt's current appt is scheduled 01/18/18 at 12pm with Arlys John, NP and we can also schedule pt to see Dr. Isaiah Serge on 6/4 for a visit as pt will be switching from Dr. [name] to Dr. Isaiah Serge.

## 2017-12-30 ENCOUNTER — Ambulatory Visit: Payer: Medicare Other | Admitting: Internal Medicine

## 2018-01-03 ENCOUNTER — Ambulatory Visit (HOSPITAL_COMMUNITY)
Admission: RE | Admit: 2018-01-03 | Discharge: 2018-01-03 | Disposition: A | Discharge status: Home / Self Care | Payer: Medicare Other | Source: Ambulatory Visit | Attending: Nurse Practitioner | Admitting: Nurse Practitioner

## 2018-01-03 ENCOUNTER — Telehealth: Payer: Self-pay

## 2018-01-03 ENCOUNTER — Encounter (HOSPITAL_COMMUNITY): Payer: Self-pay | Admitting: Nurse Practitioner

## 2018-01-03 VITALS — BP 138/76 | HR 67 | Ht 66.0 in | Wt 214.0 lb

## 2018-01-03 DIAGNOSIS — N183 Chronic kidney disease, stage 3 (moderate): Secondary | ICD-10-CM | POA: Diagnosis not present

## 2018-01-03 DIAGNOSIS — I129 Hypertensive chronic kidney disease with stage 1 through stage 4 chronic kidney disease, or unspecified chronic kidney disease: Secondary | ICD-10-CM | POA: Insufficient documentation

## 2018-01-03 DIAGNOSIS — J45909 Unspecified asthma, uncomplicated: Secondary | ICD-10-CM | POA: Insufficient documentation

## 2018-01-03 DIAGNOSIS — Z8249 Family history of ischemic heart disease and other diseases of the circulatory system: Secondary | ICD-10-CM | POA: Diagnosis not present

## 2018-01-03 DIAGNOSIS — E1140 Type 2 diabetes mellitus with diabetic neuropathy, unspecified: Secondary | ICD-10-CM | POA: Diagnosis not present

## 2018-01-03 DIAGNOSIS — F419 Anxiety disorder, unspecified: Secondary | ICD-10-CM | POA: Insufficient documentation

## 2018-01-03 DIAGNOSIS — M797 Fibromyalgia: Secondary | ICD-10-CM | POA: Insufficient documentation

## 2018-01-03 DIAGNOSIS — G40909 Epilepsy, unspecified, not intractable, without status epilepticus: Secondary | ICD-10-CM | POA: Insufficient documentation

## 2018-01-03 DIAGNOSIS — F319 Bipolar disorder, unspecified: Secondary | ICD-10-CM | POA: Diagnosis not present

## 2018-01-03 DIAGNOSIS — Z794 Long term (current) use of insulin: Secondary | ICD-10-CM | POA: Insufficient documentation

## 2018-01-03 DIAGNOSIS — Z86010 Personal history of colonic polyps: Secondary | ICD-10-CM | POA: Diagnosis not present

## 2018-01-03 DIAGNOSIS — Z7901 Long term (current) use of anticoagulants: Secondary | ICD-10-CM | POA: Insufficient documentation

## 2018-01-03 DIAGNOSIS — I480 Paroxysmal atrial fibrillation: Secondary | ICD-10-CM

## 2018-01-03 DIAGNOSIS — Z7951 Long term (current) use of inhaled steroids: Secondary | ICD-10-CM | POA: Insufficient documentation

## 2018-01-03 DIAGNOSIS — E785 Hyperlipidemia, unspecified: Secondary | ICD-10-CM | POA: Insufficient documentation

## 2018-01-03 DIAGNOSIS — E1122 Type 2 diabetes mellitus with diabetic chronic kidney disease: Secondary | ICD-10-CM | POA: Diagnosis not present

## 2018-01-03 DIAGNOSIS — Z79899 Other long term (current) drug therapy: Secondary | ICD-10-CM | POA: Insufficient documentation

## 2018-01-03 DIAGNOSIS — G8929 Other chronic pain: Secondary | ICD-10-CM | POA: Diagnosis not present

## 2018-01-03 DIAGNOSIS — K589 Irritable bowel syndrome without diarrhea: Secondary | ICD-10-CM | POA: Diagnosis not present

## 2018-01-03 DIAGNOSIS — M545 Low back pain: Secondary | ICD-10-CM | POA: Insufficient documentation

## 2018-01-03 DIAGNOSIS — K219 Gastro-esophageal reflux disease without esophagitis: Secondary | ICD-10-CM | POA: Diagnosis not present

## 2018-01-03 MED ORDER — AMIODARONE HCL 200 MG PO TABS: 200.0000 mg | ORAL_TABLET | Freq: Every day | ORAL | 0 refills | Status: DC

## 2018-01-03 NOTE — Progress Notes (Signed)
Primary Care Physician: [name], MD Referring Physician: Dr. Denyce Robert [name] is a 67 y.o. female with a h/o afib s/p ablation 11/16/17. She presented to the ER after she had LOC. She was bradycardic but in SR, otherwise stable.Patient was seen by electrophysiology who stopped her metoprolol due to her bradycardia,stopped tikosyn and  placed her on amiodarone.  Patient did have one episode of atrial fibrillation with RVR with chest pain.  Per electrophysiology, this is not abnormal after recent ablation.  Patient went back into rate controlled sinus rhythm and chest pain resolved.  No changes in troponins or ST elevation.  Patient had echocardiogram which did not show any acute changes from baseline (results below).  Workup by cardiology led them to think that her loss of consciousness episodes were not due to her heart.  Given patient's history of possible seizure activity, neurology was consulted.  Patient had EEG and normal head MRI.  Neurology did not recommending starting epileptiics and recommend outpatient follow-up. Other workup was unremarkable including normal electrolytes and labs.  Patient had normal orthostatics.  Patient was evaluated by physical therapy recommended outpatient physical therapy.  Upon discharge, patient was stable on room air, tolerating p.o., normal urine output, normotensive, and rate controlled.  In the afib clinic on f/u, she is in SR. She does not report any other issues with LOC. No afib that she is aware of. No complaints. No swallowing or groin issues from ablation.  F/u one month s/p ablation. Pt is in SR. She does not report any spells of afib. She  denies any swallowing or groin issues. She feels improved.  Today, she denies symptoms of palpitations, chest pain, shortness of breath, orthopnea, PND, lower extremity edema, dizziness, presyncope, syncope, or neurologic sequela. The patient is tolerating medications without difficulties and is  otherwise without complaint today.   Past Medical History:  Diagnosis Date  . Anxiety   . Arthritis    "knees, hands, ankles, feet" (05/12/2016)  . Asthma    Dr. [name]  . Bipolar disorder (HCC)   . Chronic back pain    "lower and middle" (05/12/2016)  . CKD (chronic kidney disease), stage III (HCC)   . Colon polyps   . Depression   . Diverticulosis   . Epilepsy (HCC)   . Essential hypertension   . Fibromyalgia   . Gallstones   . GERD (gastroesophageal reflux disease)   . H/O hiatal hernia   . Hyperlipidemia   . IBS (irritable bowel syndrome)   . Paroxysmal A-fib (HCC)    failed medical therapy with tikosyn, s/p AF ablation x 2  . Peripheral neuropathy   . Sarcoidosis of lung (HCC)    Dr. [name]  . Seizures (HCC)    "epileptic; pretty regular recently" (05/12/2016)  . Syncope   . Type II diabetes mellitus (HCC)    Past Surgical History:  Procedure Laterality Date  . ABDOMINAL HYSTERECTOMY  1995  . ANTERIOR CERVICAL DECOMP/DISCECTOMY FUSION  07/11/2012   Procedure: ANTERIOR CERVICAL DECOMPRESSION/DISCECTOMY FUSION 1 LEVEL;  Surgeon: [name], MD;  Location: [facility];  Service: Neurosurgery;  Laterality: N/A;  Cervical Five-Six Anterior Cervical Decompression with Fusion Interbody Prothesis Plating and Bonegraft  . ATRIAL FIBRILLATION ABLATION N/A 05/18/2017   Procedure: Atrial Fibrillation Ablation;  Surgeon: [name], MD;  Location: [facility];  Service: Cardiovascular;  Laterality: N/A;  . ATRIAL FIBRILLATION ABLATION N/A 11/16/2017   Procedure: ATRIAL FIBRILLATION ABLATION;  Surgeon: Johney Frame,  Fayrene Fearing, MD;  Location: [facility];  Service: Cardiovascular;  Laterality: N/A;  . CARDIAC CATHETERIZATION  2012   a. Normal coronaries 2012. b. Normal nuc 09/2014.  Marland Kitchen CARDIAC CATHETERIZATION  05/18/2017  . CHOLECYSTECTOMY OPEN  1976  . CLOSED REDUCTION ANKLE FRACTURE Left 10/2006   "got steel rod in my leg; and screws"  . COLON SURGERY    . DILATION AND  CURETTAGE OF UTERUS    . ESOPHAGEAL MANOMETRY  03/21/2012   Procedure: ESOPHAGEAL MANOMETRY (EM);  Surgeon: [name], MD;  Location: [facility];  Service: Endoscopy;  Laterality: N/A;  . FRACTURE SURGERY    . NEUROPLASTY / TRANSPOSITION MEDIAN NERVE AT CARPAL TUNNEL Left 2004  . NEUROPLASTY / TRANSPOSITION MEDIAN NERVE AT CARPAL TUNNEL Right 2002  . 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 SALPINGOOPHORECTOMY Bilateral 2000  . TEE WITHOUT CARDIOVERSION N/A 05/18/2017   Procedure: TRANSESOPHAGEAL ECHOCARDIOGRAM (TEE);  Surgeon: [name], MD;  Location: [facility] ENDOSCOPY;  Service: Cardiovascular;  Laterality: N/A;  . TUBAL LIGATION  1982    Current Outpatient Medications  Medication Sig Dispense Refill  . albuterol (PROAIR HFA) 108 (90 Base) MCG/ACT inhaler inhale 2 puffs every 6 hours if needed for wheezing or shortness of breath 8.5 g 1  . albuterol (PROVENTIL) (2.5 MG/3ML) 0.083% nebulizer solution Take 3 mLs (2.5 mg total) by nebulization every 4 (four) hours as needed for wheezing or shortness of breath (dx: J45.31). 75 mL 5  . amiodarone (PACERONE) 200 MG tablet Take 1 tablet (200 mg total) by mouth daily. 180 tablet 0  . amLODipine (NORVASC) 10 MG tablet TAKE 1 TABLET BY MOUTH EVERY DAY 90 tablet 0  . atorvastatin (LIPITOR) 80 MG tablet Take 80 mg by mouth at bedtime.  0  . calcium carbonate (OS-CAL) 600 MG TABS tablet Take 600 mg by mouth daily.    . cholecalciferol (VITAMIN D) 1000 UNITS tablet Take 1,000 Units by mouth 2 (two) times daily.     . Cranberry 500 MG CAPS Take 500 mg by mouth 2 (two) times daily.    . diphenoxylate-atropine (LOMOTIL) 2.5-0.025 MG tablet Take 1 tablet by mouth 2 (two) times daily as needed for diarrhea or loose stools. 30 tablet 0  . DULERA 100-5 MCG/ACT AERO inhale 2 puffs INTO THE LUNGS every 12 hours 13 g 11  . DULoxetine (CYMBALTA) 60 MG capsule Take 60 mg by mouth at bedtime.     Marland Kitchen esomeprazole (NEXIUM) 40 MG capsule Take 1 capsule (40 mg  total) by mouth 2 (two) times daily. Take 30 minutes before breakfast and dinner. 60 capsule 3  . EVZIO 0.4 MG/0.4ML SOAJ Inject 0.4 mLs as directed daily as needed (for overdose).   0  . fluticasone (FLONASE) 50 MCG/ACT nasal spray instill 2 sprays into each nostril once daily (Patient taking differently: Place 1 spray into both nostrils 2 (two) times daily. ) 16 g 6  . HUMALOG KWIKPEN 100 UNIT/ML KiwkPen Inject 18-32 Units into the skin 3 (three) times daily with meals. PER Patient home SLIDING SCALE 18 units if CBG < 200 32 units  If CBG  > 200  0  . Insulin Glargine (LANTUS SOLOSTAR) 100 UNIT/ML Solostar Pen Inject 40 Units into the skin at bedtime. (Patient taking differently: Inject 50 Units into the skin at bedtime. ) 15 mL 11  . irbesartan (AVAPRO) 75 MG tablet Take 1 tablet by mouth daily.  11  . loperamide (IMODIUM A-D) 2 MG tablet Take  1 tablet (2 mg total) by mouth 4 (four) times daily as needed for diarrhea or loose stools. 30 tablet 0  . loratadine (CLARITIN) 10 MG tablet Take 1 tablet (10 mg total) by mouth daily. 30 tablet 0  . magnesium oxide (MAG-OX) 400 MG tablet Take 1 tablet (400 mg total) by mouth 2 (two) times daily. (Patient taking differently: Take 400 mg by mouth 2 (two) times daily. ) 60 tablet 11  . mometasone-formoterol (DULERA) 200-5 MCG/ACT AERO Inhale 2 puffs into the lungs 2 (two) times daily. For 2 weeks, then resume your Dulera 100 1 Inhaler 0  . nitroGLYCERIN (NITROSTAT) 0.4 MG SL tablet Place 1 tablet (0.4 mg total) under the tongue every 5 (five) minutes as needed for chest pain (do nto exceed 3 doses). (Patient taking differently: Place 0.4 mg under the tongue every 5 (five) minutes x 3 doses as needed for chest pain. ) 25 tablet 2  . ondansetron (ZOFRAN) 4 MG tablet Take 1 tablet (4 mg total) by mouth every 8 (eight) hours as needed for nausea or vomiting. 20 tablet 0  . oxyCODONE-acetaminophen (PERCOCET) 10-325 MG tablet Take 1 tablet by mouth 5 (five) times  daily. (scheduled)     . Polyvinyl Alcohol-Povidone (REFRESH OP) Place 1 drop into both eyes 3 (three) times daily as needed (for dry eyes).     . potassium chloride (K-DUR) 10 MEQ tablet take 1 tablet by mouth once daily (Patient taking differently: Take 10 MEQ by mouth once daily) 90 tablet 3  . pregabalin (LYRICA) 100 MG capsule Take 100 mg by mouth 3 (three) times daily.    . rivaroxaban (XARELTO) 20 MG TABS tablet Take 20 mg by mouth once a day with supper (Patient taking differently: Take 20 mg by mouth daily with supper. ) 30 tablet 9  . tamsulosin (FLOMAX) 0.4 MG CAPS capsule Take 0.4 mg by mouth at bedtime.  0  . vitamin C (ASCORBIC ACID) 500 MG tablet Take 500 mg by mouth 2 (two) times daily.    . VOLTAREN 1 % GEL APPLY 2 INCHES TO AFFECTED AREA FOUR TIMES A DAY (Patient taking differently: APPLY 2 INCHES TO AFFECTED AREA THREE TIMES A DAY) 500 g 3   No current facility-administered medications for this encounter.     Allergies  Allergen Reactions  . Prednisone Other (See Comments)    SENT PATIENT INTO A-FIB   . Amitriptyline Other (See Comments)    Made the patient disoriented  . Hydromorphone Hcl Other (See Comments)    Made the blood pressure drop (HYPOtension)  . Lisinopril Cough    Social History   Socioeconomic History  . Marital status: Divorced    Spouse name: Not on file  . Number of children: 3  . Years of education: 12th  . Highest education level: Not on file  Occupational History  . Occupation: disabled    Comment: CNA  Social Needs  . Financial resource strain: Not on file  . Food insecurity:    Worry: Not on file    Inability: Not on file  . Transportation needs:    Medical: Not on file    Non-medical: Not on file  Tobacco Use  . Smoking status: Never Smoker  . Smokeless tobacco: Never Used  Substance and Sexual Activity  . Alcohol use: No  . Drug use: No  . Sexual activity: Not Currently  Lifestyle  . Physical activity:    Days per week:  Not on file  Minutes per session: Not on file  . Stress: Not on file  Relationships  . Social connections:    Talks on phone: Not on file    Gets together: Not on file    Attends religious service: Not on file    Active member of club or organization: Not on file    Attends meetings of clubs or organizations: Not on file    Relationship status: Not on file  . Intimate partner violence:    Fear of current or ex partner: Not on file    Emotionally abused: Not on file    Physically abused: Not on file    Forced sexual activity: Not on file  Other Topics Concern  . Not on file  Social History Narrative   Pt. Lives at home with her daughter. She is single, has three children, does not work currently, and has a 12th grade education level. She has never used tobacco or alcohol. Quit using illicit drugs at the age of 18 and rarely ha caffeine.    Family History  Problem Relation Age of Onset  . Heart attack Mother        @ age 75  . Mental illness Mother   . Diabetes Mother   . Hypertension Mother        siblings  . Alzheimer's disease Mother   . Depression Mother   . Hyperlipidemia Mother   . Heart attack Brother 50  . Alcohol abuse Brother   . Depression Brother   . Diabetes Brother   . Hyperlipidemia Brother   . Hypertension Brother   . Kidney disease Brother   . Drug abuse Brother   . Alcohol abuse Father   . Heart attack Father   . Hyperlipidemia Father   . Hypertension Father   . Colon cancer Maternal Aunt   . Prostate cancer Maternal Grandfather   . Diabetes Maternal Grandfather   . Hyperlipidemia Maternal Grandfather   . Ovarian cancer Maternal Aunt   . Diabetes Unknown   . Hypertension Unknown   . Lupus Sister   . Alcohol abuse Sister   . Depression Sister   . Diabetes Sister   . Hyperlipidemia Sister   . Hypertension Sister   . Kidney disease Sister   . Drug abuse Sister   . Ovarian cancer Cousin   . Diabetes Maternal Grandmother   . Hyperlipidemia  Maternal Grandmother   . Breast cancer Neg Hx     ROS- All systems are reviewed and negative except as per the HPI above  Physical Exam: Vitals:   01/03/18 1105  BP: 138/76  Pulse: 67  Weight: 214 lb (97.1 kg)  Height:  (1.676 m)   Wt Readings from Last 3 Encounters:  01/03/18 214 lb (97.1 kg)  12/21/17 215 lb (97.5 kg)  12/07/17 216 lb (98 kg)    Labs: Lab Results  Component Value Date   NA 136 11/25/2017   K 4.4 11/25/2017   CL 101 11/25/2017   CO2 25 11/25/2017   GLUCOSE 195 (H) 11/25/2017   BUN 16 11/25/2017   CREATININE 1.51 (H) 11/25/2017   CALCIUM 9.1 11/25/2017   MG 1.9 11/23/2017   Lab Results  Component Value Date   INR 1.2 04/20/2017   Lab Results  Component Value Date   CHOL 159 07/26/2015   HDL 37 (L) 07/26/2015   LDLCALC NOT CALC 07/26/2015   TRIG 530 (H) 07/26/2015     GEN- The patient is well appearing, alert  and oriented x 3 today.   Head- normocephalic, atraumatic Eyes-  Sclera clear, conjunctiva pink Ears- hearing intact Oropharynx- clear Neck- supple, no JVP Lymph- no cervical lymphadenopathy Lungs- Clear to ausculation bilaterally, normal work of breathing Heart- Regular rate and rhythm, no murmurs, rubs or gallops, PMI not laterally displaced GI- soft, NT, ND, + BS Extremities- no clubbing, cyanosis, or edema MS- no significant deformity or atrophy Skin- no rash or lesion Psych- euthymic mood, full affect Neuro- strength and sensation are intact  EKG- SR with premature atrail complexes, pr int 184 ms, qrs int 86 ms, qtc 359 ms Epic records reviewed    Assessment and Plan: 1. Paroxysmal afib S/p ablation 11/16/17 Off tikosyn Has been  loading on amiodarone 200 mg bid since 4/8, will reduce dose to 200 mg a day In SR, feels well  2. Chadsvasc score of at least 4 Continue xarelto 20 mg daily  3. LOC No further episodes  W/u in hospital negative for neuro cause  4. HTN Stable  F/u with Dr. Johney Frame 7/8  She shows  [facility]   Elvina Sidle. [facility] [address] [place], [place] [address] [phone]

## 2018-01-03 NOTE — Telephone Encounter (Signed)
[name]'s friend answered the phone and will give [name] message to return my call when she returns (regarding PREP at [facility])

## 2018-01-04 ENCOUNTER — Other Ambulatory Visit: Payer: Self-pay | Admitting: Adult Health

## 2018-01-04 DIAGNOSIS — J4531 Mild persistent asthma with (acute) exacerbation: Secondary | ICD-10-CM

## 2018-01-05 ENCOUNTER — Other Ambulatory Visit: Payer: Self-pay

## 2018-01-05 ENCOUNTER — Ambulatory Visit (INDEPENDENT_AMBULATORY_CARE_PROVIDER_SITE_OTHER): Payer: Medicare Other | Admitting: Internal Medicine

## 2018-01-05 ENCOUNTER — Encounter: Payer: Self-pay | Admitting: Internal Medicine

## 2018-01-05 VITALS — BP 122/78 | HR 79 | Temp 98.1°F | Ht 66.0 in | Wt 217.2 lb

## 2018-01-05 DIAGNOSIS — I10 Essential (primary) hypertension: Secondary | ICD-10-CM | POA: Diagnosis not present

## 2018-01-05 NOTE — Patient Instructions (Addendum)
Please follow up in 1-2 months.

## 2018-01-05 NOTE — Progress Notes (Signed)
   [name] [name], MD Phone: [phone]  Reason For Visit: Follow up    # CHRONIC HTN: Reports patient states that she had her lisinopril changed to irbesartan.  Current Meds - Norvasc and Irbesartan  Reports good compliance, took meds today. Tolerating well, w/o complaints. Lifestyle - Currently getting ready to start exercises Denies CP, dyspnea, edema, dizziness / lightheadedness  Past Medical History Reviewed problem list.  Medications- reviewed and updated No additions to family history Social history- patient is a non-smoker  Objective: BP 122/78   Pulse 79   Temp 98.1 F (36.7 C) (Oral)   Ht  (1.676 m)   Wt 217 lb 3.2 oz (98.5 kg)   SpO2 98%   BMI 35.06 kg/m  Gen: NAD, alert, cooperative with exam Cardio: regular rate and rhythm, S1S2 heard, no murmurs appreciated Skin: dry, intact, no rashes or lesions  Assessment/Plan: See problem based a/p  Essential hypertension Blood pressure well controlled  - Recently changed by pulmnology from lisinopril to  Irbesartan due to ACE induced cough - per patient he is obtaining a BMET in next couple of days to follow up on this medication  - Continue norvasc - Follow up in 2-3 months

## 2018-01-05 NOTE — Assessment & Plan Note (Addendum)
Blood pressure well controlled  - Recently changed by pulmnology from lisinopril to  Irbesartan due to ACE induced cough - per patient he is obtaining a BMET in next couple of days to follow up on this medication  - Continue norvasc - Follow up in 2-3 months

## 2018-01-06 ENCOUNTER — Ambulatory Visit: Payer: Medicare Other

## 2018-01-06 VITALS — BP 143/90 | HR 68

## 2018-01-06 DIAGNOSIS — R2689 Other abnormalities of gait and mobility: Secondary | ICD-10-CM | POA: Diagnosis not present

## 2018-01-06 DIAGNOSIS — M6281 Muscle weakness (generalized): Secondary | ICD-10-CM

## 2018-01-06 DIAGNOSIS — R293 Abnormal posture: Secondary | ICD-10-CM

## 2018-01-06 NOTE — Therapy (Signed)
[facility] [address] [place], [place], [address] Phone: [phone]   Fax:  [phone]  Physical Therapy Treatment  Patient Details  Name: [name] MRN: [id number] Date of Birth: 05/04/1951 Referring Provider: Dr. Cathlean Cower  (Dr. Marjory Lies (neurologist) can't get in until 05/2018)   Encounter Date: 01/06/2018  PT End of Session - 01/06/18 1059    Visit Number  2    Number of Visits  9    Date for PT Re-Evaluation  02/20/18    Authorization Type  [employer] Medicare primary and Medicaid secondary    PT Start Time  1019    PT Stop Time  1058    PT Time Calculation (min)  39 min    Equipment Utilized During Treatment  -- min guard to S prn    Activity Tolerance  Patient tolerated treatment well    Behavior During Therapy  [facility] for tasks assessed/performed       Past Medical History:  Diagnosis Date  . Anxiety   . Arthritis    "knees, hands, ankles, feet" (05/12/2016)  . Asthma    Dr. [name]  . Bipolar disorder (HCC)   . Chronic back pain    "lower and middle" (05/12/2016)  . CKD (chronic kidney disease), stage III (HCC)   . Colon polyps   . Depression   . Diverticulosis   . Epilepsy (HCC)   . Essential hypertension   . Fibromyalgia   . Gallstones   . GERD (gastroesophageal reflux disease)   . H/O hiatal hernia   . Hyperlipidemia   . IBS (irritable bowel syndrome)   . Paroxysmal A-fib (HCC)    failed medical therapy with tikosyn, s/p AF ablation x 2  . Peripheral neuropathy   . Sarcoidosis of lung (HCC)    Dr. [name]  . Seizures (HCC)    "epileptic; pretty regular recently" (05/12/2016)  . Syncope   . Type II diabetes mellitus (HCC)     Past Surgical History:  Procedure Laterality Date  . ABDOMINAL HYSTERECTOMY  1995  . ANTERIOR CERVICAL DECOMP/DISCECTOMY FUSION  07/11/2012   Procedure: ANTERIOR CERVICAL DECOMPRESSION/DISCECTOMY FUSION 1 LEVEL;  Surgeon: [name], MD;  Location: [facility];   Service: Neurosurgery;  Laterality: N/A;  Cervical Five-Six Anterior Cervical Decompression with Fusion Interbody Prothesis Plating and Bonegraft  . ATRIAL FIBRILLATION ABLATION N/A 05/18/2017   Procedure: Atrial Fibrillation Ablation;  Surgeon: [name], MD;  [facility]: [facility];  Service: Cardiovascular;  Laterality: N/A;  . ATRIAL FIBRILLATION ABLATION N/A 11/16/2017   Procedure: ATRIAL FIBRILLATION ABLATION;  Surgeon: [name], MD;  [facility]: [facility];  Service: Cardiovascular;  Laterality: N/A;  . CARDIAC CATHETERIZATION  2012   a. Normal coronaries 2012. b. Normal nuc 09/2014.  Marland Kitchen CARDIAC CATHETERIZATION  05/18/2017  . CHOLECYSTECTOMY OPEN  1976  . CLOSED REDUCTION ANKLE FRACTURE Left 10/2006   "got steel rod in my leg; and screws"  . COLON SURGERY    . DILATION AND CURETTAGE OF UTERUS    . ESOPHAGEAL MANOMETRY  03/21/2012   Procedure: ESOPHAGEAL MANOMETRY (EM);  Surgeon: [name], MD;  Location: [facility];  Service: Endoscopy;  Laterality: N/A;  . FRACTURE SURGERY    . NEUROPLASTY / TRANSPOSITION MEDIAN NERVE AT CARPAL TUNNEL Left 2004  . NEUROPLASTY / TRANSPOSITION MEDIAN NERVE AT CARPAL TUNNEL Right 2002  . 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 SALPINGOOPHORECTOMY Bilateral 2000  . TEE WITHOUT CARDIOVERSION N/A  05/18/2017   Procedure: TRANSESOPHAGEAL ECHOCARDIOGRAM (TEE);  Surgeon: [name], MD;  Location: [facility] ENDOSCOPY;  Service: Cardiovascular;  Laterality: N/A;  . TUBAL LIGATION  1982    Vitals:   01/06/18 1054  BP: (!) 143/90  Pulse: 68  At end of session.  Subjective Assessment - 01/06/18 1022    Subjective  Pt reported she has incr. in BLE pain. Pt denied falls since last visit. Pt took Lyrica at beginning of the session, as her ride came too early today.     Pertinent History  HTN, syncope, orthostatic hypotension, a-fib, asthma, bipolar, anxiety, depression, DM, seizures, sarcoidosis of lungs, CKD, HLD, chronic back pain,  arthritis, fibromyalgia, peripheral neuropathy (B UE/LE)    Patient Stated Goals  Try to get some strength in my body, improve balance, walk longer distances    Currently in Pain?  Yes    Pain Score  6     Pain Location  Leg    Pain Orientation  Right;Left    Pain Descriptors / Indicators  Burning;Tingling    Pain Type  Neuropathic pain    Pain Onset  More than a month ago    Aggravating Factors   weather, walking    Pain Relieving Factors  Voltaren gel , Lyrica         [facility] PT Assessment - 01/06/18 1027      6 Minute Walk- Baseline   6 Minute Walk- Baseline  yes    BP (mmHg)  143/84    HR (bpm)  72    02 Sat (%RA)  98 %    Modified Borg Scale for Dyspnea  0- Nothing at all    Perceived Rate of Exertion (Borg)  6-      6 Minute walk- Post Test   6 Minute Walk Post Test  yes    BP (mmHg)  158/90    HR (bpm)  71    02 Sat (%RA)  98 %    Modified Borg Scale for Dyspnea  3- Moderate shortness of breath or breathing difficulty    Perceived Rate of Exertion (Borg)  17- Very hard      6 minute walk test results    Aerobic Endurance Distance Walked  248    Endurance additional comments  Seated rest break at 40 sec. and lasted until 1 minute and 30 sec., another seated rest break at 3 minutes and standing at 2 minutes. Pt took another seated rest break at 4 minutes and 30 sec. Pt used SBQC during test and grabbed equipment and furniture to alleviate B foot pain.       Seated rest break required after .              [facility] Adult PT Treatment/Exercise - 01/06/18 1045      Standardized Balance Assessment   Standardized Balance Assessment  Dynamic Gait Index      Dynamic Gait Index   Level Surface  Moderate Impairment    Change in Gait Speed  Moderate Impairment    Gait with Horizontal Head Turns  Moderate Impairment    Gait with Vertical Head Turns  Moderate Impairment    Gait and Pivot Turn  Mild Impairment    Step Over Obstacle  Moderate Impairment    Step  Around Obstacles  Mild Impairment    Steps  Moderate Impairment    Total Score  10             PT Education -  01/06/18 1057    Education provided  Yes    Education Details  PT discussed outcome measures and falls risk. PT discussed the importance of walking and taking medication as prescribed to reduce pain.     Person(s) Educated  Patient    Methods  Explanation    Comprehension  Verbalized understanding       PT Short Term Goals - 12/22/17 1632      PT SHORT TERM GOAL #1   Title  same as LTGs        PT Long Term Goals - 01/06/18 1135      PT LONG TERM GOAL #1   Title  Pt will be IND with HEP to improve balance, endurance, and strength. TARGET DATE FOR ALL LTGS: 01/19/18    Status  New      PT LONG TERM GOAL #2   Title  Pt will improve TUG time with LRAD to </=13.5 sec. to decr. falls risk.     Status  New      PT LONG TERM GOAL #3   Title  Pt will improve gait speed to >/=2.43ft/sec. to decr. falls risk and safely amb. in the community with LRAD.    Status  New      PT LONG TERM GOAL #4   Title  Pt will amb. 500' over even/paved surfaces at MOD I level with LRAD to improve functional mobility.     Status  New      PT LONG TERM GOAL #5   Title  Perform DGI and write goal as indicated.     Status  Achieved      Additional Long Term Goals   Additional Long Term Goals  Yes      PT LONG TERM GOAL #6   Title  Pt will improve DGI score to >/=16/24 to decr. falls risk.     Status  New      PT LONG TERM GOAL #7   Title  Pt will improve distance to 448' with LRAD to improve endurance.     Status  New            Plan - 01/06/18 1100    Clinical Impression Statement  Pt's distance (75.58m) below norms for age group (60-69y/o-570m). Pt limited today 2/2 pain and fatigue. Pt's DGI score 10/24 indicates pt is at a high falls risk. Pt required frequent, long standing and seated rest breaks during session 2/2 pain. Pt's BP was slightly elevated today,  which could be 2/2 pain. PT will continue to monitor. Pt would continue to benefit from skilled PT to improve safety during functional mobility.     Rehab Potential  Fair    Clinical Impairments Affecting Rehab Potential  see above.     PT Frequency  2x / week    PT Duration  4 weeks    PT Treatment/Interventions  ADLs/Self Care Home Management;Biofeedback;Therapeutic activities;Therapeutic exercise;Manual techniques;Vestibular;Orthotic Fit/Training;Functional mobility training;Stair training;Gait training;DME Instruction;Neuromuscular re-education;Balance training;Patient/family education    PT Next Visit Plan  Initiate HEP, initiate walking program and assess BP.     Consulted and Agree with Plan of Care  Patient       Patient will benefit from skilled therapeutic intervention in order to improve the following deficits and impairments:  Abnormal gait, Decreased endurance, Decreased knowledge of use of DME, Decreased strength, Decreased balance, Decreased mobility, Pain, Impaired flexibility, Postural dysfunction, Impaired sensation  Visit Diagnosis: Other abnormalities of gait and mobility  Abnormal posture  Muscle weakness (generalized)     Problem List Patient Active Problem List   Diagnosis Date Noted  . Cough due to ACE inhibitor 12/21/2017  . Gait abnormality 11/30/2017  . Cardiac syncope 11/22/2017  . Type II diabetes mellitus (HCC)   . Bipolar disorder (HCC)   . Atrial fibrillation (HCC)   . Chronic back pain   . CKD (chronic kidney disease), stage III (HCC)   . Depression   . Anxiety   . Paroxysmal A-fib (HCC)   . Diarrhea 09/03/2017  . Estrogen deficiency 06/07/2017  . Healthcare maintenance 06/03/2017  . Paroxysmal atrial fibrillation (HCC) 05/18/2017  . A-fib (HCC) 05/05/2017  . Back pain 12/11/2016  . Pelvic pain 11/25/2016  . Chronic pain 09/08/2016  . Spells of decreased attentiveness 09/08/2016  . Persistent atrial fibrillation (HCC)   . Irritable bowel  syndrome 03/03/2016  . Chronic leukopenia 09/13/2015  . PAF (paroxysmal atrial fibrillation) (HCC) 08/22/2015  . Morbid obesity (HCC) 07/11/2015  . Bipolar I disorder (HCC) 05/22/2015  . Chronic kidney disease (CKD), stage III (moderate) (HCC) 05/14/2015  . Insulin dependent diabetes mellitus (HCC)   . Seizure disorder (HCC)   . History of diverticulitis   . Syncope 06/29/2012  . Orthostatic hypotension 06/29/2012  . History of colonic polyps 09/01/2011  . Sarcoid (HCC) 09/01/2011  . Asthma 09/01/2011  . GERD (gastroesophageal reflux disease) 09/01/2011  . 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 Diabetes mellitus type 2, uncontrolled, with complications (HCC) 11/19/2008  . Hyperlipidemia 11/19/2008  . Essential hypertension 11/19/2008    Theophile Harvie L 01/06/2018, 11:38 AM  [facility] [address] [place], [place], [address] Phone: [phone]   Fax:  [phone]  Name: [name] MRN: [id number] Date of Birth: Aug 17, 1951  Zerita Boers, PT,DPT 01/06/18 11:39 AM Phone: [phone] Fax: [phone]

## 2018-01-07 ENCOUNTER — Ambulatory Visit: Payer: Medicare Other

## 2018-01-07 DIAGNOSIS — M6281 Muscle weakness (generalized): Secondary | ICD-10-CM

## 2018-01-07 DIAGNOSIS — R2689 Other abnormalities of gait and mobility: Secondary | ICD-10-CM | POA: Diagnosis not present

## 2018-01-07 DIAGNOSIS — R293 Abnormal posture: Secondary | ICD-10-CM

## 2018-01-07 NOTE — Patient Instructions (Signed)
Access Code: [email or web address]  URL: [email or web address]  Date: 01/07/2018  Prepared by: Zerita Boers   Exercises  Seated Hamstring Stretch - 10 reps - 3 sets - 3x daily - 7x weekly  Supine Bridge - 10 reps - 1 sets - 1x daily - 5x weekly  Hooklying Small March - 10 reps - 1 sets - 1x daily - 5x weekly  Seated Piriformis Stretch - 10 reps - 3 sets - 3x daily - 7x weekly  Supine Lower Trunk Rotation - 3 reps - 1 sets - 1x daily - 7x weekly

## 2018-01-07 NOTE — Therapy (Signed)
[facility] [address] [place], [place], [address] Phone: [phone]   Fax:  [phone]  Physical Therapy Treatment  Patient Details  Name: [name] MRN: [id number] Date of Birth: 27-Jan-1951 Referring Provider: Dr. Cathlean Cower  (Dr. Marjory Lies (neurologist) can't get in until 05/2018)   Encounter Date: 01/07/2018  PT End of Session - 01/07/18 1112    Visit Number  3    Number of Visits  9    Date for PT Re-Evaluation  02/20/18    Authorization Type  [employer]HC Medicare primary and Medicaid secondary    PT Start Time  1018    PT Stop Time  1102    PT Time Calculation (min)  44 min    Equipment Utilized During Treatment  -- S prn    Activity Tolerance  Patient tolerated treatment well    Behavior During Therapy  [facility] for tasks assessed/performed       Past Medical History:  Diagnosis Date  . Anxiety   . Arthritis    "knees, hands, ankles, feet" (05/12/2016)  . Asthma    Dr. [name]  . Bipolar disorder (HCC)   . Chronic back pain    "lower and middle" (05/12/2016)  . CKD (chronic kidney disease), stage III (HCC)   . Colon polyps   . Depression   . Diverticulosis   . Epilepsy (HCC)   . Essential hypertension   . Fibromyalgia   . Gallstones   . GERD (gastroesophageal reflux disease)   . H/O hiatal hernia   . Hyperlipidemia   . IBS (irritable bowel syndrome)   . Paroxysmal A-fib (HCC)    failed medical therapy with tikosyn, s/p AF ablation x 2  . Peripheral neuropathy   . Sarcoidosis of lung (HCC)    Dr. [name]  . Seizures (HCC)    "epileptic; pretty regular recently" (05/12/2016)  . Syncope   . Type II diabetes mellitus (HCC)     Past Surgical History:  Procedure Laterality Date  . ABDOMINAL HYSTERECTOMY  1995  . ANTERIOR CERVICAL DECOMP/DISCECTOMY FUSION  07/11/2012   Procedure: ANTERIOR CERVICAL DECOMPRESSION/DISCECTOMY FUSION 1 LEVEL;  Surgeon: [name], MD;  Location: [facility];  Service:  Neurosurgery;  Laterality: N/A;  Cervical Five-Six Anterior Cervical Decompression with Fusion Interbody Prothesis Plating and Bonegraft  . ATRIAL FIBRILLATION ABLATION N/A 05/18/2017   Procedure: Atrial Fibrillation Ablation;  Surgeon: [name], MD;  [facility]: [facility] LAB;  Service: Cardiovascular;  Laterality: N/A;  . ATRIAL FIBRILLATION ABLATION N/A 11/16/2017   Procedure: ATRIAL FIBRILLATION ABLATION;  Surgeon: [name], MD;  [facility]: [facility];  Service: Cardiovascular;  Laterality: N/A;  . CARDIAC CATHETERIZATION  2012   a. Normal coronaries 2012. b. Normal nuc 09/2014.  Marland Kitchen CARDIAC CATHETERIZATION  05/18/2017  . CHOLECYSTECTOMY OPEN  1976  . CLOSED REDUCTION ANKLE FRACTURE Left 10/2006   "got steel rod in my leg; and screws"  . COLON SURGERY    . DILATION AND CURETTAGE OF UTERUS    . ESOPHAGEAL MANOMETRY  03/21/2012   Procedure: ESOPHAGEAL MANOMETRY (EM);  Surgeon: [name], MD;  Location: [facility]OS[facility];  Service: Endoscopy;  Laterality: N/A;  . FRACTURE SURGERY    . NEUROPLASTY / TRANSPOSITION MEDIAN NERVE AT CARPAL TUNNEL Left 2004  . NEUROPLASTY / TRANSPOSITION MEDIAN NERVE AT CARPAL TUNNEL Right 2002  . 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 SALPINGOOPHORECTOMY Bilateral 2000  . TEE WITHOUT CARDIOVERSION N/A 05/18/2017  Procedure: TRANSESOPHAGEAL ECHOCARDIOGRAM (TEE);  Surgeon: [name], MD;  Location: [facility] ENDOSCOPY;  Service: Cardiovascular;  Laterality: N/A;  . TUBAL LIGATION  1982    There were no vitals filed for this visit.  Subjective Assessment - 01/07/18 1022    Subjective  Pt denied falls since last visit. Pt feels better today. Pt now living with her best friend, as she was living with her dtr but her dtr's children and grandchildren also moved in and it was too chaotic for pt.     Pertinent History  HTN, syncope, orthostatic hypotension, a-fib, asthma, bipolar, anxiety, depression, DM, seizures, sarcoidosis of lungs, CKD, HLD,  chronic back pain, arthritis, fibromyalgia, peripheral neuropathy (B UE/LE)    Patient Stated Goals  Try to get some strength in my body, improve balance, walk longer distances    Currently in Pain?  Yes    Pain Score  -- 4-5/10    Pain Location  Back    Pain Orientation  Left;Right    Pain Descriptors / Indicators  Tingling;Burning    Pain Type  Neuropathic pain    Pain Radiating Towards  BLEs    Pain Onset  More than a month ago    Pain Frequency  Constant    Aggravating Factors   walking and rainy weather     Pain Relieving Factors  Voltaren gel, Lyrica          Therex: Performed in seated and hooklying positions, with cues and demo for technique and no incr. In pain reported. 30 sec. Hold for all stretches.  Exercises  Seated Hamstring Stretch - 10 reps - 3 sets - 3x daily - 7x weekly  Supine Bridge - 10 reps - 1 sets - 1x daily - 5x weekly  Hooklying Small March - 10 reps - 1 sets - 1x daily - 5x weekly  Seated Piriformis Stretch - 10 reps - 3 sets - 3x daily - 7x weekly  Supine Lower Trunk Rotation - 3 reps - 1 sets - 1x daily - 7x weekly , plus gentle rocking in B lateral directions for approx. 10 sec. After each stretch to reduce tension and pain. Supine hip abd with knees in flexion x10 reps/side (not include in HEP).                  PT Education - 01/07/18 1111    Education provided  Yes    Education Details  PT provided pt with stretching and strengthening HEP in order to improve strength and decr. back/LE pain, as this limits her ability to amb. and perform activities.     Person(s) Educated  Patient    Methods  Explanation;Demonstration;Tactile cues;Verbal cues;Handout    Comprehension  Verbalized understanding;Returned demonstration       PT Short Term Goals - 12/22/17 1632      PT SHORT TERM GOAL #1   Title  same as LTGs        PT Long Term Goals - 01/06/18 1135      PT LONG TERM GOAL #1   Title  Pt will be IND with HEP to improve  balance, endurance, and strength. TARGET DATE FOR ALL LTGS: 01/19/18    Status  New      PT LONG TERM GOAL #2   Title  Pt will improve TUG time with LRAD to </=13.5 sec. to decr. falls risk.     Status  New      PT LONG TERM GOAL #3  Title  Pt will improve gait speed to >/=2.43ft/sec. to decr. falls risk and safely amb. in the community with LRAD.    Status  New      PT LONG TERM GOAL #4   Title  Pt will amb. 500' over even/paved surfaces at MOD I level with LRAD to improve functional mobility.     Status  New      PT LONG TERM GOAL #5   Title  Perform DGI and write goal as indicated.     Status  Achieved      Additional Long Term Goals   Additional Long Term Goals  Yes      PT LONG TERM GOAL #6   Title  Pt will improve DGI score to >/=16/24 to decr. falls risk.     Status  New      PT LONG TERM GOAL #7   Title  Pt will improve distance to 448' with LRAD to improve endurance.     Status  New            Plan - 01/07/18 1113    Clinical Impression Statement  Today's skilled session focused on estability HEP to improve flexibility (relax/reduce pain) and improve strength. Pt tolerated HEP well with no c/o incr. pain. Pt did requires rest breaks 2/2 fatigue. Pt noted to amb. with improve trunk rotation and stride length after performing HEP, and leaving clinic. Continue with POC.     Rehab Potential  Fair    Clinical Impairments Affecting Rehab Potential  see above.     PT Frequency  2x / week    PT Duration  4 weeks    PT Treatment/Interventions  ADLs/Self Care Home Management;Biofeedback;Therapeutic activities;Therapeutic exercise;Manual techniques;Vestibular;Orthotic Fit/Training;Functional mobility training;Stair training;Gait training;DME Instruction;Neuromuscular re-education;Balance training;Patient/family education    PT Next Visit Plan  Initiate balance HEP, initiate walking program and assess BP.     Consulted and Agree with Plan of Care  Patient        Patient will benefit from skilled therapeutic intervention in order to improve the following deficits and impairments:  Abnormal gait, Decreased endurance, Decreased knowledge of use of DME, Decreased strength, Decreased balance, Decreased mobility, Pain, Impaired flexibility, Postural dysfunction, Impaired sensation  Visit Diagnosis: Muscle weakness (generalized)  Other abnormalities of gait and mobility  Abnormal posture     Problem List Patient Active Problem List   Diagnosis Date Noted  . Cough due to ACE inhibitor 12/21/2017  . Gait abnormality 11/30/2017  . Cardiac syncope 11/22/2017  . Type II diabetes mellitus (HCC)   . Bipolar disorder (HCC)   . Atrial fibrillation (HCC)   . Chronic back pain   . CKD (chronic kidney disease), stage III (HCC)   . Depression   . Anxiety   . Paroxysmal A-fib (HCC)   . Diarrhea 09/03/2017  . Estrogen deficiency 06/07/2017  . Healthcare maintenance 06/03/2017  . Paroxysmal atrial fibrillation (HCC) 05/18/2017  . A-fib (HCC) 05/05/2017  . Back pain 12/11/2016  . Pelvic pain 11/25/2016  . Chronic pain 09/08/2016  . Spells of decreased attentiveness 09/08/2016  . Persistent atrial fibrillation (HCC)   . Irritable bowel syndrome 03/03/2016  . Chronic leukopenia 09/13/2015  . PAF (paroxysmal atrial fibrillation) (HCC) 08/22/2015  . Morbid obesity (HCC) 07/11/2015  . Bipolar I disorder (HCC) 05/22/2015  . Chronic kidney disease (CKD), stage III (moderate) (HCC) 05/14/2015  . Insulin dependent diabetes mellitus (HCC)   . Seizure disorder (HCC)   . History of diverticulitis   .  Syncope 06/29/2012  . Orthostatic hypotension 06/29/2012  . History of colonic polyps 09/01/2011  . Sarcoid (HCC) 09/01/2011  . Asthma 09/01/2011  . GERD (gastroesophageal reflux disease) 09/01/2011  . 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 Diabetes mellitus type 2, uncontrolled, with complications (HCC) 11/19/2008  . Hyperlipidemia 11/19/2008  . Essential  hypertension 11/19/2008    Miller,Jennifer L 01/07/2018, 11:16 AM   [facility] [address] [place], [place], [address] Phone: [phone]   Fax:  [phone]  Name: [name] MRN: [id number] Date of Birth: [birth year]  Zerita Boers, PT,DPT 01/07/18 11:18 AM Phone: [phone] Fax: [phone]

## 2018-01-12 ENCOUNTER — Telehealth: Payer: Self-pay

## 2018-01-12 NOTE — Telephone Encounter (Signed)
Left VM for pt to call back regarding referral for the PREP to start at the [facility] in mid-June.

## 2018-01-13 ENCOUNTER — Ambulatory Visit: Payer: Medicare Other

## 2018-01-13 VITALS — BP 143/82 | HR 77

## 2018-01-13 DIAGNOSIS — R293 Abnormal posture: Secondary | ICD-10-CM

## 2018-01-13 DIAGNOSIS — R2689 Other abnormalities of gait and mobility: Secondary | ICD-10-CM

## 2018-01-13 DIAGNOSIS — M6281 Muscle weakness (generalized): Secondary | ICD-10-CM

## 2018-01-13 NOTE — Therapy (Signed)
[facility] [address] [place], [place], [address] Phone: [phone]   Fax:  [phone]  Physical Therapy Treatment  Patient Details  Name: [name] MRN: [id number] Date of Birth: May 17, 1951 Referring Provider: Dr. Cathlean Cower  (Dr. Marjory Lies (neurologist) can't get in until 05/2018)   Encounter Date: 01/13/2018  PT End of Session - 01/13/18 0930    Visit Number  4    Number of Visits  9    Date for PT Re-Evaluation  02/20/18    Authorization Type  [employer]HC Medicare primary and Medicaid secondary    PT Start Time  [phone] pt was late    PT Stop Time  0930    PT Time Calculation (min)  38 min    Equipment Utilized During Treatment  -- S prn    Activity Tolerance  Patient tolerated treatment well    Behavior During Therapy  [facility] for tasks assessed/performed       Past Medical History:  Diagnosis Date  . Anxiety   . Arthritis    "knees, hands, ankles, feet" (05/12/2016)  . Asthma    Dr. [name]  . Bipolar disorder (HCC)   . Chronic back pain    "lower and middle" (05/12/2016)  . CKD (chronic kidney disease), stage III (HCC)   . Colon polyps   . Depression   . Diverticulosis   . Epilepsy (HCC)   . Essential hypertension   . Fibromyalgia   . Gallstones   . GERD (gastroesophageal reflux disease)   . H/O hiatal hernia   . Hyperlipidemia   . IBS (irritable bowel syndrome)   . Paroxysmal A-fib (HCC)    failed medical therapy with tikosyn, s/p AF ablation x 2  . Peripheral neuropathy   . Sarcoidosis of lung (HCC)    Dr. [name]  . Seizures (HCC)    "epileptic; pretty regular recently" (05/12/2016)  . Syncope   . Type II diabetes mellitus (HCC)     Past Surgical History:  Procedure Laterality Date  . ABDOMINAL HYSTERECTOMY  1995  . ANTERIOR CERVICAL DECOMP/DISCECTOMY FUSION  07/11/2012   Procedure: ANTERIOR CERVICAL DECOMPRESSION/DISCECTOMY FUSION 1 LEVEL;  Surgeon: [name], MD;  Location: [facility];   Service: Neurosurgery;  Laterality: N/A;  Cervical Five-Six Anterior Cervical Decompression with Fusion Interbody Prothesis Plating and Bonegraft  . ATRIAL FIBRILLATION ABLATION N/A 05/18/2017   Procedure: Atrial Fibrillation Ablation;  Surgeon: [name], MD;  [facility]: [facility] IN[facility];  Service: Cardiovascular;  Laterality: N/A;  . ATRIAL FIBRILLATION ABLATION N/A 11/16/2017   Procedure: ATRIAL FIBRILLATION ABLATION;  Surgeon: [name], MD;  [facility]: [facility] CV [facility];  Service: Cardiovascular;  Laterality: N/A;  . CARDIAC CATHETERIZATION  2012   a. Normal coronaries 2012. b. Normal nuc 09/2014.  Marland Kitchen CARDIAC CATHETERIZATION  05/18/2017  . CHOLECYSTECTOMY OPEN  1976  . CLOSED REDUCTION ANKLE FRACTURE Left 10/2006   "got steel rod in my leg; and screws"  . COLON SURGERY    . DILATION AND CURETTAGE OF UTERUS    . ESOPHAGEAL MANOMETRY  03/21/2012   Procedure: ESOPHAGEAL MANOMETRY (EM);  Surgeon: [name], MD;  Location: [facility];  Service: Endoscopy;  Laterality: N/A;  . FRACTURE SURGERY    . NEUROPLASTY / TRANSPOSITION MEDIAN NERVE AT CARPAL TUNNEL Left 2004  . NEUROPLASTY / TRANSPOSITION MEDIAN NERVE AT CARPAL TUNNEL Right 2002  . 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 SALPINGOOPHORECTOMY Bilateral 2000  . TEE WITHOUT CARDIOVERSION N/A  05/18/2017   Procedure: TRANSESOPHAGEAL ECHOCARDIOGRAM (TEE);  Surgeon: [name], MD;  Location: [facility] ENDOSCOPY;  Service: Cardiovascular;  Laterality: N/A;  . TUBAL LIGATION  1982    Vitals:   01/13/18 0854 01/13/18 0913  BP: 132/71 (!) 143/82  Pulse: 76 77    Subjective Assessment - 01/13/18 0854    Subjective  Pt denied falls since last visit. Pt went home and took a nap after last session but tolerated exercises well overall.     Pertinent History  HTN, syncope, orthostatic hypotension, a-fib, asthma, bipolar, anxiety, depression, DM, seizures, sarcoidosis of lungs, CKD, HLD, chronic back pain, arthritis, fibromyalgia,  peripheral neuropathy (B UE/LE)    Patient Stated Goals  Try to get some strength in my body, improve balance, walk longer distances    Pain Score  6     Pain Location  Back    Pain Orientation  Left;Right;Lower    Pain Descriptors / Indicators  Cramping;Aching    Pain Type  Neuropathic pain;Chronic pain    Pain Radiating Towards  BLEs    Pain Onset  More than a month ago    Pain Frequency  Constant    Aggravating Factors   standing up and walking    Pain Relieving Factors  Voltaren gel, Lyrica                            Balance Exercises - 01/13/18 0912      Balance Exercises: Standing   Standing Eyes Opened  Narrow base of support (BOS);Wide (BOA);Head turns;Solid surface;3 reps;10 secs;30 secs    Standing Eyes Closed  Narrow base of support (BOS);Wide (BOA);Solid surface;3 reps;10 secs;30 secs    Tandem Stance  Eyes open;Intermittent upper extremity support;4 reps;30 secs    SLS  Eyes open;Solid surface;Upper extremity support 1;4 reps;10 secs    Other Standing Exercises  Performed in corner with S for safety and cues/demo for technique. Please see pt instructions for HEP details.         PT Education - 01/13/18 0930    Education provided  Yes    Education Details  PT provided pt with balance HEP. PT informed pt that nurse from MD office called pt yesterday re: [employer] PREP program.     Person(s) Educated  Patient    Methods  Explanation;Demonstration;Verbal cues;Handout    Comprehension  Verbalized understanding;Returned demonstration       PT Short Term Goals - 12/22/17 1632      PT SHORT TERM GOAL #1   Title  same as LTGs        PT Long Term Goals - 01/06/18 1135      PT LONG TERM GOAL #1   Title  Pt will be IND with HEP to improve balance, endurance, and strength. TARGET DATE FOR ALL LTGS: 01/19/18    Status  New      PT LONG TERM GOAL #2   Title  Pt will improve TUG time with LRAD to </=13.5 sec. to decr. falls risk.     Status  New       PT LONG TERM GOAL #3   Title  Pt will improve gait speed to >/=2.23ft/sec. to decr. falls risk and safely amb. in the community with LRAD.    Status  New      PT LONG TERM GOAL #4   Title  Pt will amb. 500' over even/paved surfaces at MOD I level with  LRAD to improve functional mobility.     Status  New      PT LONG TERM GOAL #5   Title  Perform DGI and write goal as indicated.     Status  Achieved      Additional Long Term Goals   Additional Long Term Goals  Yes      PT LONG TERM GOAL #6   Title  Pt will improve DGI score to >/=16/24 to decr. falls risk.     Status  New      PT LONG TERM GOAL #7   Title  Pt will improve distance to 448' with LRAD to improve endurance.     Status  New            Plan - 01/13/18 0931    Clinical Impression Statement  Today's skilled session focused on establishing balance HEP to improve balance and vestibular/visual input as pt has hx of neuropathic LE pain. Pt's diastolic did incr by after activity but pt denied pain, N/T, weakness, chest pain and reported she used inhaler prior to session. Pt would continue to benefit from skilled PT to improve safety during functional mobility.     Rehab Potential  Fair    Clinical Impairments Affecting Rehab Potential  see above.     PT Frequency  2x / week    PT Duration  4 weeks    PT Treatment/Interventions  ADLs/Self Care Home Management;Biofeedback;Therapeutic activities;Therapeutic exercise;Manual techniques;Vestibular;Orthotic Fit/Training;Functional mobility training;Stair training;Gait training;DME Instruction;Neuromuscular re-education;Balance training;Patient/family education    PT Next Visit Plan   initiate walking program and assess BP. Endurance and gait training, no cane. Check goals and add more visits.    Consulted and Agree with Plan of Care  Patient       Patient will benefit from skilled therapeutic intervention in order to improve the following deficits and impairments:   Abnormal gait, Decreased endurance, Decreased knowledge of use of DME, Decreased strength, Decreased balance, Decreased mobility, Pain, Impaired flexibility, Postural dysfunction, Impaired sensation  Visit Diagnosis: Other abnormalities of gait and mobility  Muscle weakness (generalized)  Abnormal posture     Problem List Patient Active Problem List   Diagnosis Date Noted  . Cough due to ACE inhibitor 12/21/2017  . Gait abnormality 11/30/2017  . Cardiac syncope 11/22/2017  . Type II diabetes mellitus (HCC)   . Bipolar disorder (HCC)   . Atrial fibrillation (HCC)   . Chronic back pain   . CKD (chronic kidney disease), stage III (HCC)   . Depression   . Anxiety   . Paroxysmal A-fib (HCC)   . Diarrhea 09/03/2017  . Estrogen deficiency 06/07/2017  . Healthcare maintenance 06/03/2017  . Paroxysmal atrial fibrillation (HCC) 05/18/2017  . A-fib (HCC) 05/05/2017  . Back pain 12/11/2016  . Pelvic pain 11/25/2016  . Chronic pain 09/08/2016  . Spells of decreased attentiveness 09/08/2016  . Persistent atrial fibrillation (HCC)   . Irritable bowel syndrome 03/03/2016  . Chronic leukopenia 09/13/2015  . PAF (paroxysmal atrial fibrillation) (HCC) 08/22/2015  . Morbid obesity (HCC) 07/11/2015  . Bipolar I disorder (HCC) 05/22/2015  . Chronic kidney disease (CKD), stage III (moderate) (HCC) 05/14/2015  . Insulin dependent diabetes mellitus (HCC)   . Seizure disorder (HCC)   . History of diverticulitis   . Syncope 06/29/2012  . Orthostatic hypotension 06/29/2012  . History of colonic polyps 09/01/2011  . Sarcoid (HCC) 09/01/2011  . Asthma 09/01/2011  . GERD (gastroesophageal reflux disease) 09/01/2011  .  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 Diabetes mellitus type 2, uncontrolled, with complications (HCC) 11/19/2008  . Hyperlipidemia 11/19/2008  . Essential hypertension 11/19/2008    [name] 01/13/2018, 9:34 AM  [facility] [address] [place], [place], [address] Phone: [phone]   Fax:  [phone]  Name: [name] MRN: [id number] Date of Birth: 1950/11/17  Zerita Boers, PT,DPT 01/13/18 9:34 AM Phone: [phone] Fax: [phone]

## 2018-01-13 NOTE — Patient Instructions (Addendum)
[facility] Access Code: [email or web address]  URL: [email or web address]  Date: 01/13/2018  Prepared by: Zerita Boers   Exercises  Romberg Stance - 3 reps - 1 sets - 30 hold - 1x daily - 7x weekly  Romberg Stance with Head Nods - 3 reps - 1 sets - 1 hold - 1x daily - 7x weekly  Romberg Stance with Eyes Closed - 3 reps - 1 sets - 10 hold - 1x daily - 7x weekly  Tandem Stance with Support - 3 reps - 1 sets - 30 hold - 1x daily - 7x weekly  Standing Single Leg Stance with Unilateral Counter Support - 3 reps - 1 sets - 10 hold - 1x daily - 7x weekly

## 2018-01-14 ENCOUNTER — Encounter: Payer: Self-pay | Admitting: Physical Therapy

## 2018-01-14 ENCOUNTER — Ambulatory Visit: Payer: Medicare Other | Admitting: Physical Therapy

## 2018-01-14 ENCOUNTER — Other Ambulatory Visit: Payer: Self-pay

## 2018-01-14 VITALS — BP 154/84 | HR 73

## 2018-01-14 DIAGNOSIS — R293 Abnormal posture: Secondary | ICD-10-CM

## 2018-01-14 DIAGNOSIS — R2689 Other abnormalities of gait and mobility: Secondary | ICD-10-CM

## 2018-01-14 DIAGNOSIS — M6281 Muscle weakness (generalized): Secondary | ICD-10-CM

## 2018-01-14 MED ORDER — MOMETASONE FURO-FORMOTEROL FUM 100-5 MCG/ACT IN AERO: 2.0000 | INHALATION_SPRAY | Freq: Two times a day (BID) | RESPIRATORY_TRACT | 11 refills | Status: DC

## 2018-01-14 NOTE — Therapy (Signed)
[facility] [address] [place], [place], [address] Phone: [phone]   Fax:  [phone]  Physical Therapy Treatment  Patient Details  Name: [name] MRN: [id number] Date of Birth: [birth year] Referring Provider: Dr. Cathlean Cower  (Dr. Marjory Lies (neurologist) can't get in until 05/2018)   Encounter Date: 01/14/2018  PT End of Session - 01/14/18 0941    Visit Number  5    Number of Visits  9    Date for PT Re-Evaluation  02/20/18    Authorization Type  [employer]HC Medicare primary and Medicaid secondary    PT Start Time  [phone] pt late for appt today    PT Stop Time  1006 pt had to leave due to another appt     PT Time Calculation (min)  28 min    Equipment Utilized During Treatment  Gait belt    Activity Tolerance  Patient tolerated treatment well    Behavior During Therapy  [facility] for tasks assessed/performed       Past Medical History:  Diagnosis Date  . Anxiety   . Arthritis    "knees, hands, ankles, feet" (05/12/2016)  . Asthma    Dr. [name]  . Bipolar disorder (HCC)   . Chronic back pain    "lower and middle" (05/12/2016)  . CKD (chronic kidney disease), stage III (HCC)   . Colon polyps   . Depression   . Diverticulosis   . Epilepsy (HCC)   . Essential hypertension   . Fibromyalgia   . Gallstones   . GERD (gastroesophageal reflux disease)   . H/O hiatal hernia   . Hyperlipidemia   . IBS (irritable bowel syndrome)   . Paroxysmal A-fib (HCC)    failed medical therapy with tikosyn, s/p AF ablation x 2  . Peripheral neuropathy   . Sarcoidosis of lung (HCC)    Dr. [name]  . Seizures (HCC)    "epileptic; pretty regular recently" (05/12/2016)  . Syncope   . Type II diabetes mellitus (HCC)     Past Surgical History:  Procedure Laterality Date  . ABDOMINAL HYSTERECTOMY  1995  . ANTERIOR CERVICAL DECOMP/DISCECTOMY FUSION  07/11/2012   Procedure: ANTERIOR CERVICAL DECOMPRESSION/DISCECTOMY FUSION 1 LEVEL;  Surgeon:  [name], MD;  Location: [facility];  Service: Neurosurgery;  Laterality: N/A;  Cervical Five-Six Anterior Cervical Decompression with Fusion Interbody Prothesis Plating and Bonegraft  . ATRIAL FIBRILLATION ABLATION N/A 05/18/2017   Procedure: Atrial Fibrillation Ablation;  Surgeon: [name], MD;  Location: [facility] INVAS[facility] CV LAB;  Service: Cardiovascular;  Laterality: N/A;  . ATRIAL FIBRILLATION ABLATION N/A 11/16/2017   Procedure: ATRIAL FIBRILLATION ABLATION;  Surgeon: [name], MD;  Location: [facility] LAB;  Service: Cardiovascular;  Laterality: N/A;  . CARDIAC CATHETERIZATION  2012   a. Normal coronaries 2012. b. Normal nuc 09/2014.  Marland Kitchen CARDIAC CATHETERIZATION  05/18/2017  . CHOLECYSTECTOMY OPEN  1976  . CLOSED REDUCTION ANKLE FRACTURE Left 10/2006   "got steel rod in my leg; and screws"  . COLON SURGERY    . DILATION AND CURETTAGE OF UTERUS    . ESOPHAGEAL MANOMETRY  03/21/2012   Procedure: ESOPHAGEAL MANOMETRY (EM);  Surgeon: [name], MD;  Location: [facility] ENDOSCOPY;  Service: Endoscopy;  Laterality: N/A;  . FRACTURE SURGERY    . NEUROPLASTY / TRANSPOSITION MEDIAN NERVE AT CARPAL TUNNEL Left 2004  . NEUROPLASTY / TRANSPOSITION MEDIAN NERVE AT CARPAL TUNNEL Right 2002  . 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  SALPINGOOPHORECTOMY Bilateral 2000  . TEE WITHOUT CARDIOVERSION N/A 05/18/2017   Procedure: TRANSESOPHAGEAL ECHOCARDIOGRAM (TEE);  Surgeon: [name], MD;  Location: [facility] ENDOSCOPY;  Service: Cardiovascular;  Laterality: N/A;  . TUBAL LIGATION  1982    Vitals:   01/14/18 0940 01/14/18 0952  BP: (!) 147/80 (!) 154/84  Pulse: 71 73    Subjective Assessment - 01/14/18 0940    Subjective  No new falls. Does report some right leg, groin and back pain today.     Pertinent History  HTN, syncope, orthostatic hypotension, a-fib, asthma, bipolar, anxiety, depression, DM, seizures, sarcoidosis of lungs, CKD, HLD, chronic back pain, arthritis, fibromyalgia,  peripheral neuropathy (B UE/LE)    Patient Stated Goals  Try to get some strength in my body, improve balance, walk longer distances    Currently in Pain?  Yes    Pain Score  7     Pain Location  Generalized    Pain Orientation  Right;Lower    Pain Descriptors / Indicators  Aching    Pain Type  Chronic pain;Neuropathic pain    Pain Onset  More than a month ago    Pain Frequency  Constant    Aggravating Factors   increased mobility (prolonged standing and walking)    Pain Relieving Factors  voltaren gel, Lyrica, rest          [facility] Adult PT Treatment/Exercise - 01/14/18 0945      Ambulation/Gait   Ambulation/Gait  Yes    Ambulation/Gait Assistance  5: Supervision;4: Min guard    Ambulation/Gait Assistance Details  use of quad cane to enter/exit gym with supervision. cues on posture and step length; gait in session with no AD, after just over 1 full lap around blue track (~130 feet) pt needed to sit down due to sudden onset of "dizziness". resolved with seated rest breaks of ~30 sec's and pt was able to complete second lap without no further issues. Did need cues on posture, arm swing and step length with gait without cane.                      Ambulation Distance (Feet)  100 Feet x2 with cane; 230 (with seated rest x1) no AD    Assistive device  Small based quad cane;None    Gait Pattern  Step-through pattern;Decreased stride length;Decreased weight shift to right;Decreased stance time - left;Antalgic    Ambulation Surface  Level;Indoor      Exercises   Exercises  Other Exercises    Other Exercises   hooklying on mat table: posterior pelvic tilt for 5 sec holds x 10 reps; single knee to chest stretch 30 sec's x2 reps each side; bil bridge with 5 sec holds x 10 reps; hip fall outs with red band resistance x 10 reps each side. pt needed cues on ex form/technique and hold times with all ex's performed today.                                PT Short Term Goals - 12/22/17 1632       PT SHORT TERM GOAL #1   Title  same as LTGs        PT Long Term Goals - 01/06/18 1135      PT LONG TERM GOAL #1   Title  Pt will be IND with HEP to improve balance, endurance, and strength. TARGET DATE FOR ALL LTGS:  01/19/18    Status  New      PT LONG TERM GOAL #2   Title  Pt will improve TUG time with LRAD to </=13.5 sec. to decr. falls risk.     Status  New      PT LONG TERM GOAL #3   Title  Pt will improve gait speed to >/=2.4362ft/sec. to decr. falls risk and safely amb. in the community with LRAD.    Status  New      PT LONG TERM GOAL #4   Title  Pt will amb. 500' over even/paved surfaces at MOD I level with LRAD to improve functional mobility.     Status  New      PT LONG TERM GOAL #5   Title  Perform DGI and write goal as indicated.     Status  Achieved      Additional Long Term Goals   Additional Long Term Goals  Yes      PT LONG TERM GOAL #6   Title  Pt will improve DGI score to >/=16/24 to decr. falls risk.     Status  New      PT LONG TERM GOAL #7   Title  Pt will improve 6MWT distance to 448' with LRAD to improve endurance.     Status  New            Plan - 01/14/18 [id number]    Clinical Impression Statement  Today's skilled session continued to address activity tolerance, gait with and without an AD and strengthening/stretching for decreased pain. Pt did report less pain after ex's performed, down ot 5/10. VVS with session today. Advised pt to begin walking at home with cane for up to 3 consecutive mintues at this time. Will progress the walking time as able. Pt is making slow, steady progress toward goals and should benefit from continued PT to progress toward unmet goals.     Rehab Potential  Fair    Clinical Impairments Affecting Rehab Potential  see above.     PT Frequency  2x / week    PT Duration  4 weeks    PT Treatment/Interventions  ADLs/Self Care Home Management;Biofeedback;Therapeutic activities;Therapeutic exercise;Manual  techniques;Vestibular;Orthotic Fit/Training;Functional mobility training;Stair training;Gait training;DME Instruction;Neuromuscular re-education;Balance training;Patient/family education    PT Next Visit Plan  monitor BP with sessions; check to see if she started walking for 3 minutes at home, continue to work on endurance and gait training, LTGs due 01/19/18.     Consulted and Agree with Plan of Care  Patient       Patient will benefit from skilled therapeutic intervention in order to improve the following deficits and impairments:  Abnormal gait, Decreased endurance, Decreased knowledge of use of DME, Decreased strength, Decreased balance, Decreased mobility, Pain, Impaired flexibility, Postural dysfunction, Impaired sensation  Visit Diagnosis: Other abnormalities of gait and mobility  Muscle weakness (generalized)  Abnormal posture     Problem List Patient Active Problem List   Diagnosis Date Noted  . Cough due to ACE inhibitor 12/21/2017  . Gait abnormality 11/30/2017  . Cardiac syncope 11/22/2017  . Type II diabetes mellitus (HCC)   . Bipolar disorder (HCC)   . Atrial fibrillation (HCC)   . Chronic back pain   . CKD (chronic kidney disease), stage III (HCC)   . Depression   . Anxiety   . Paroxysmal A-fib (HCC)   . Diarrhea 09/03/2017  . Estrogen deficiency 06/07/2017  . Healthcare maintenance 06/03/2017  . Paroxysmal atrial  fibrillation (HCC) 05/18/2017  . A-fib (HCC) 05/05/2017  . Back pain 12/11/2016  . Pelvic pain 11/25/2016  . Chronic pain 09/08/2016  . Spells of decreased attentiveness 09/08/2016  . Persistent atrial fibrillation (HCC)   . Irritable bowel syndrome 03/03/2016  . Chronic leukopenia 09/13/2015  . PAF (paroxysmal atrial fibrillation) (HCC) 08/22/2015  . Morbid obesity (HCC) 07/11/2015  . Bipolar I disorder (HCC) 05/22/2015  . Chronic kidney disease (CKD), stage III (moderate) (HCC) 05/14/2015  . Insulin dependent diabetes mellitus (HCC)   .  Seizure disorder (HCC)   . History of diverticulitis   . Syncope 06/29/2012  . Orthostatic hypotension 06/29/2012  . History of colonic polyps 09/01/2011  . Sarcoid (HCC) 09/01/2011  . Asthma 09/01/2011  . GERD (gastroesophageal reflux disease) 09/01/2011  . 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 Diabetes mellitus type 2, uncontrolled, with complications (HCC) 11/19/2008  . Hyperlipidemia 11/19/2008  . Essential hypertension 11/19/2008    Sallyanne Kuster, PTA, [facility] [address] [place], [place] [address] [phone] 01/14/18, 11:04 AM   Name: CHERYLYN SUNDBY MRN: [id number] Date of Birth: August 13, 1951

## 2018-01-17 ENCOUNTER — Encounter: Payer: Self-pay | Admitting: Rehabilitation

## 2018-01-17 ENCOUNTER — Ambulatory Visit: Payer: Medicare Other | Attending: Internal Medicine | Admitting: Rehabilitation

## 2018-01-17 VITALS — BP 137/74

## 2018-01-17 DIAGNOSIS — G8929 Other chronic pain: Secondary | ICD-10-CM | POA: Diagnosis not present

## 2018-01-17 DIAGNOSIS — R2689 Other abnormalities of gait and mobility: Secondary | ICD-10-CM | POA: Diagnosis present

## 2018-01-17 DIAGNOSIS — R293 Abnormal posture: Secondary | ICD-10-CM | POA: Diagnosis not present

## 2018-01-17 DIAGNOSIS — M5441 Lumbago with sciatica, right side: Secondary | ICD-10-CM | POA: Diagnosis not present

## 2018-01-17 DIAGNOSIS — M6281 Muscle weakness (generalized): Secondary | ICD-10-CM | POA: Diagnosis not present

## 2018-01-17 NOTE — Therapy (Signed)
[facility] [address] [place], [place], [address] Phone: [phone]   Fax:  [phone]  Physical Therapy Treatment  Patient Details  Name: [name] MRN: [id number] Date of Birth: 1950/09/14 Referring Provider: Dr. Emmaline Life  (Dr. Leta Baptist (neurologist) can't get in until 05/2018)   Encounter Date: 01/17/2018  PT End of Session - 01/17/18 1555    Visit Number  6    Number of Visits  9    Date for PT Re-Evaluation  02/20/18    Authorization Type  [employer] Medicare primary and Medicaid secondary    PT Start Time  1321    PT Stop Time  1402    PT Time Calculation (min)  41 min    Equipment Utilized During Treatment  Gait belt    Activity Tolerance  Patient tolerated treatment well    Behavior During Therapy  WFL for tasks assessed/performed       Past Medical History:  Diagnosis Date  . Anxiety   . Arthritis    "knees, hands, ankles, feet" (05/12/2016)  . Asthma    Dr. Melvyn Novas  . Bipolar disorder ([place])   . Chronic back pain    "lower and middle" (05/12/2016)  . CKD (chronic kidney disease), stage III ([place])   . Colon polyps   . Depression   . Diverticulosis   . Epilepsy ([place])   . Essential hypertension   . Fibromyalgia   . Gallstones   . GERD (gastroesophageal reflux disease)   . H/O hiatal hernia   . Hyperlipidemia   . IBS (irritable bowel syndrome)   . Paroxysmal A-fib ([place])    failed medical therapy with tikosyn, s/p AF ablation x 2  . Peripheral neuropathy   . Sarcoidosis of lung ([place])    Dr. Melvyn Novas  . Seizures ([place])    "epileptic; pretty regular recently" (05/12/2016)  . Syncope   . Type II diabetes mellitus ([place])     Past Surgical History:  Procedure Laterality Date  . ABDOMINAL HYSTERECTOMY  1995  . ANTERIOR CERVICAL DECOMP/DISCECTOMY FUSION  07/11/2012   Procedure: ANTERIOR CERVICAL DECOMPRESSION/DISCECTOMY FUSION 1 LEVEL;  Surgeon: Ophelia Charter, MD;  Location: [facility];  Service:  Neurosurgery;  Laterality: N/A;  Cervical Five-Six Anterior Cervical Decompression with Fusion Interbody Prothesis Plating and Bonegraft  . ATRIAL FIBRILLATION ABLATION N/A 05/18/2017   Procedure: Atrial Fibrillation Ablation;  Surgeon: Thompson Grayer, MD;  Location: [facility];  Service: Cardiovascular;  Laterality: N/A;  . ATRIAL FIBRILLATION ABLATION N/A 11/16/2017   Procedure: ATRIAL FIBRILLATION ABLATION;  Surgeon: Thompson Grayer, MD;  Location: [facility];  Service: Cardiovascular;  Laterality: N/A;  . CARDIAC CATHETERIZATION  2012   a. Normal coronaries 2012. b. Normal nuc 09/2014.  Marland Kitchen CARDIAC CATHETERIZATION  05/18/2017  . CHOLECYSTECTOMY OPEN  1976  . CLOSED REDUCTION ANKLE FRACTURE Left 10/2006   "got steel rod in my leg; and screws"  . COLON SURGERY    . DILATION AND CURETTAGE OF UTERUS    . ESOPHAGEAL MANOMETRY  03/21/2012   Procedure: ESOPHAGEAL MANOMETRY (EM);  Surgeon: Milus Banister, MD;  Location: [facility];  Service: Endoscopy;  Laterality: N/A;  . FRACTURE SURGERY    . NEUROPLASTY / TRANSPOSITION MEDIAN NERVE AT CARPAL TUNNEL Left 2004  . NEUROPLASTY / TRANSPOSITION MEDIAN NERVE AT CARPAL TUNNEL Right 2002  . 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 SALPINGOOPHORECTOMY Bilateral 2000  . TEE WITHOUT CARDIOVERSION N/A 05/18/2017   Procedure:  TRANSESOPHAGEAL ECHOCARDIOGRAM (TEE);  Surgeon: Lelon Perla, MD;  Location[facility];  Service: Cardiovascular;  Laterality: N/A;  . [place]:   01/17/18 1324  BP: 137/74    Subjective Assessment - 01/17/18 1324    Subjective  Pt reports feeling better today due to lower heat outdoors.  No falls.  Did not bring cane to session.     Pertinent History  HTN, syncope, orthostatic hypotension, a-fib, asthma, bipolar, anxiety, depression, DM, seizures, sarcoidosis of lungs, CKD, HLD, chronic back pain, arthritis, fibromyalgia, peripheral neuropathy (B UE/LE)    Patient Stated Goals  Try to get  some strength in my body, improve balance, walk longer distances    Currently in Pain?  Yes    Pain Score  7     Pain Location  Generalized    Pain Orientation  Lower    Pain Descriptors / Indicators  Aching    Pain Type  Neuropathic pain    Pain Radiating Towards  low back to BLEs    Pain Onset  More than a month ago    Pain Frequency  Constant    Aggravating Factors   increased mobility    Pain Relieving Factors  Lyrica (during session), rest                       [facility] Adult PT Treatment/Exercise - 01/17/18 1338      Transfers   Transfers  Sit to Stand;Stand to Sit    Sit to Stand  6: Modified independent (Device/Increase time)    Five time sit to stand comments   11.04 with maximal effort    Stand to Sit  6: Modified independent (Device/Increase time)      Ambulation/Gait   Ambulation/Gait  Yes    Ambulation/Gait Assistance  5: Supervision;4: Min guard    Ambulation/Gait Assistance Details  Note that she tends to be very unsteady without use of SBQC (she ambulates into clinic without device stating she forgot cane today), and demonstrates marked improvement in balance with use of SBQC but has much slower gait speed.  Also assessed outdoor gait for LTG with use of SBQC.  Pt able to ambulate at S level over paved surfaces and up/down curb, however would recommend min/guard to closer S when ambulatory over grassy or more uneven surfaces.      Ambulation Distance (Feet)  200 Feet another 115' x 1, 300' outdoors    Assistive device  Small based quad cane;None    Gait Pattern  Step-through pattern;Decreased stride length;Decreased weight shift to right;Decreased stance time - left;Antalgic    Ambulation Surface  Level;Unlevel;Outdoor;Indoor;Paved;Grass;Gravel    Gait velocity  2.24 ft/sec without device, 1.57 ft/sec with SBQC    Gait Comments  Pt needing several seated rest breaks during session due to fatigue and dizziness.  Vitals WFL, see vitals section.         Standardized Balance Assessment   Standardized Balance Assessment  Timed Up and Go Test      Timed Up and Go Test   TUG  Normal TUG    Normal TUG (seconds)  17.12 first rep without AD, 13.06 2nd rep, no AD      Self-Care   Self-Care  Other Self-Care Comments    Other Self-Care Comments   Continue to discuss importance of using SBQC at all times due to pt having increased dizzy/swimmy headed spells and unsteadiness.  Pt verbalized understanding.  PT Education - 01/17/18 1557    Education provided  Yes    Education Details  keeping next week's visit to address remaining deficits.    Person(s) Educated  Patient    Methods  Explanation    Comprehension  Verbalized understanding       PT Short Term Goals - 12/22/17 1632      PT SHORT TERM GOAL #1   Title  same as LTGs        PT Long Term Goals - 01/17/18 1327      PT LONG TERM GOAL #1   Title  Pt will be IND with HEP to improve balance, endurance, and strength. TARGET DATE FOR ALL LTGS: 01/19/18    Status  New      PT LONG TERM GOAL #2   Title  Pt will improve TUG time with LRAD to </=13.5 sec. to decr. falls risk.     Baseline  17.12 first trial, 13.06 second trial-cues for safe turn.  (without AD)    Status  Achieved      PT LONG TERM GOAL #3   Title  Pt will improve gait speed to >/=2.62f/sec. to decr. falls risk and safely amb. in the community with LJasper    Baseline  2.24 ft/sec on 01/17/18 (only demo'd improvement from baseline, did not meet goal)    Status  Partially Met      PT LONG TERM GOAL #4   Title  Pt will amb. 500' over even/paved surfaces at MOD I level with LRAD to improve functional mobility.     Baseline  S level on 01/17/18 with SBQC    Status  Partially Met      PT LONG TERM GOAL #5   Title  Perform DGI and write goal as indicated.     Status  Achieved      PT LONG TERM GOAL #6   Title  Pt will improve DGI score to >/=16/24 to decr. falls risk.     Status  New      PT LONG TERM  GOAL #7   Title  Pt will improve 6MWT distance to 448' with LRAD to improve endurance.     Status  New            Plan - 01/17/18 1554    Clinical Impression Statement  Skilled session began to look at LTGs due to getting close to end of goal date (6/6).  Note that she has met 2/7 LTGs and partially met outdoor gait and gait speed goal.  Will plan to keep next week's dates since she has missed several visits and POC written for 60 days.      Rehab Potential  Fair    Clinical Impairments Affecting Rehab Potential  see above.     PT Frequency  2x / week    PT Duration  4 weeks    PT Treatment/Interventions  ADLs/Self Care Home Management;Biofeedback;Therapeutic activities;Therapeutic exercise;Manual techniques;Vestibular;Orthotic Fit/Training;Functional mobility training;Stair training;Gait training;DME Instruction;Neuromuscular re-education;Balance training;Patient/family education    PT Next Visit Plan  Jenn-I'm going to let you make the decision whether to D/C her or renew, she missed some visits so has not met POC, and cert date good through 7/7-see what you think, monitor BP with sessions; check to see if she started walking for 3 minutes at home, continue to work on endurance and gait training, LTGs due 01/19/18.     Consulted and Agree with Plan of Care  Patient  Patient will benefit from skilled therapeutic intervention in order to improve the following deficits and impairments:  Abnormal gait, Decreased endurance, Decreased knowledge of use of DME, Decreased strength, Decreased balance, Decreased mobility, Pain, Impaired flexibility, Postural dysfunction, Impaired sensation  Visit Diagnosis: Other abnormalities of gait and mobility  Muscle weakness (generalized)  Abnormal posture     Problem List Patient Active Problem List   Diagnosis Date Noted  . Cough due to ACE inhibitor 12/21/2017  . Gait abnormality 11/30/2017  . Cardiac syncope 11/22/2017  . Type II diabetes  mellitus ([place])   . Bipolar disorder ([place])   . Atrial fibrillation ([place])   . Chronic back pain   . CKD (chronic kidney disease), stage III ([place])   . Depression   . Anxiety   . Paroxysmal A-fib ([place])   . Diarrhea 09/03/2017  . Estrogen deficiency 06/07/2017  . Healthcare maintenance 06/03/2017  . Paroxysmal atrial fibrillation ([place]) 05/18/2017  . A-fib ([place]) 05/05/2017  . Back pain 12/11/2016  . Pelvic pain 11/25/2016  . Chronic pain 09/08/2016  . Spells of decreased attentiveness 09/08/2016  . Persistent atrial fibrillation ([place])   . Irritable bowel syndrome 03/03/2016  . Chronic leukopenia 09/13/2015  . PAF (paroxysmal atrial fibrillation) ([place]) 08/22/2015  . Morbid obesity ([place]) 07/11/2015  . Bipolar I disorder ([place]) 05/22/2015  . Chronic kidney disease (CKD), stage III (moderate) ([place]) 05/14/2015  . Insulin dependent diabetes mellitus ([place])   . Seizure disorder ([place])   . History of diverticulitis   . Syncope 06/29/2012  . Orthostatic hypotension 06/29/2012  . History of colonic polyps 09/01/2011  . Sarcoid ([place]) 09/01/2011  . Asthma 09/01/2011  . GERD (gastroesophageal reflux disease) 09/01/2011  . 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 Diabetes mellitus type 2, uncontrolled, with complications ([place]) 30/02/6225  . Hyperlipidemia 11/19/2008  . Essential hypertension 11/19/2008    Cameron Sprang, PT, MPT [facility] [address] [place], [place], [address] Phone: [phone]   Fax:  [phone] 01/17/18, 3:57 PM  Name: [name] MRN: [id number] Date of Birth: 03-11-1951

## 2018-01-18 ENCOUNTER — Ambulatory Visit: Payer: Medicare Other | Admitting: Pulmonary Disease

## 2018-01-18 NOTE — Progress Notes (Deleted)
 @Patient  ID: Toni RosenthalLinda F [name], female    DOB: 10-28-1950, 67 y.o.   MRN: [id number]  No chief complaint on file.   Referring provider: Berton [name], Toni Zahra, MD  HPI: [id number]  yobf never smoker clinical dx with cough variant asthma and sarcoid here 1995 neg tbbx but classic cxr which gradually improved   Previously on chronic steroids but stopped in 2011, unable to tolerate .  PMH : A fib s/p Ablation-on Xarelto  HTN , DM    Recent [facility] Encounters:   12/07/2017 Acute OV : Cough  Patient presents for an acute office visit.  Complains of 3 days of cough with thick mucus , but hard to get up . Has increased  Wheezing and  dyspnea, .  Had to use proAir few times. Last seen in office 2017 . Says doing well until last few days ago .  CXR on 11/23/17 showed no acute process.  Says she used to have albuterol nebs at home but does not have a machine any longer.  Appetite is good. No n/v/d.   12/21/17 Arriving follow-up appointment today was last seen on 12/07/2017 with NP.  Was in an acute exacerbation and treated for bronchitis as well as allergic rhinitis flare.  Was continued on Dulera 200 puffs twice (daily rinse after use) until sample is gone and then resume 100 mg dose.  Ensure completed Augmentin 875 mg prescription for 7 days.   Recently was started on lisinopril 5 mg daily on 12/17/17 by endocrinology for renal protection as presented with an increased cough with this prescription.  Patient has concerns if the lisinopril has increased her coughing.  Patient reports feeling much better from 4/23 appointment.  Patient states the cough is no longer productive and not feeling as fatigued or short of breath.  Patient does have dry cough as of 3 days ago when starting lisinopril. [facility] a day as instructed. Typically using SABA 2x a day.  Last ED admission was on 11/22/2017 for chronic pain.  Allergy to prednisone. Last chest x-ray was 11/23/2017. Last pulmonary function tests  November 2016. Up to date with vaccinations.  Patient to switch from Dr. Sherene SiresWert to Dr. Isaiah SergeMannam  Tests:   Imaging:  11/23/2017-chest x-ray- mild left base subsegmental atelectasis and/or scarring  Cardiac:  11/23/2017-echocardiogram-LV ejection fraction 60 to 65%, systolic function was normal  Chart Review:  11/23/2017-EEG-normal EEG in the awake, drowsy, sleep states without any epileptiform abnormalities noted     Allergies  Allergen Reactions  . Prednisone Other (See Comments)    SENT PATIENT INTO A-FIB   . Amitriptyline Other (See Comments)    Made the patient disoriented  . Hydromorphone Hcl Other (See Comments)    Made the blood pressure drop (HYPOtension)  . Lisinopril Cough    Immunization History  Administered Date(s) Administered  . Influenza, High Dose Seasonal PF 05/19/2017  . Influenza,inj,Quad PF,6+ Mos 05/02/2015, 05/13/2016  . Influenza-Unspecified 05/18/2011, 05/17/2014  . Pneumococcal Conjugate-13 09/13/2015  . Pneumococcal Polysaccharide-23 07/12/2012, 11/17/2017  . Tdap 05/22/2015    Past Medical History:  Diagnosis Date  . Anxiety   . Arthritis    "knees, hands, ankles, feet" (05/12/2016)  . Asthma    Dr. Sherene SiresWert  . Bipolar disorder ([facility])   . Chronic back pain    "lower and middle" (05/12/2016)  . CKD (chronic kidney disease), stage III ([facility])   . Colon polyps   . Depression   . Diverticulosis   . Epilepsy ([facility])   .  Essential hypertension   . Fibromyalgia   . Gallstones   . GERD (gastroesophageal reflux disease)   . H/O hiatal hernia   . Hyperlipidemia   . IBS (irritable bowel syndrome)   . Paroxysmal A-fib ([facility])    failed medical therapy with tikosyn, s/p AF ablation x 2  . Peripheral neuropathy   . Sarcoidosis of lung ([facility])    Dr. [name]  . Seizures ([facility])    "epileptic; pretty regular recently" (05/12/2016)  . Syncope   . Type II diabetes mellitus ([facility])     Tobacco History: Social History   Tobacco Use  Smoking Status Never Smoker    Smokeless Tobacco Never Used   Counseling given: Not Answered   Outpatient Encounter Medications as of 01/18/2018  Medication Sig  . albuterol (PROAIR HFA) 108 (90 Base) MCG/ACT inhaler inhale 2 puffs every 6 hours if needed for wheezing or shortness of breath  . albuterol (PROVENTIL) (2.5 MG/3ML) 0.083% nebulizer solution Take 3 mLs (2.5 mg total) by nebulization every 4 (four) hours as needed for wheezing or shortness of breath (dx: J45.31).  Marland Kitchen amiodarone (PACERONE) 200 MG tablet Take 1 tablet (200 mg total) by mouth daily.  Marland Kitchen amLODipine (NORVASC) 10 MG tablet TAKE 1 TABLET BY MOUTH EVERY DAY  . atorvastatin (LIPITOR) 80 MG tablet Take 80 mg by mouth at bedtime.  . calcium carbonate (OS-CAL) 600 MG TABS tablet Take 600 mg by mouth daily.  . cholecalciferol (VITAMIN D) 1000 UNITS tablet Take 1,000 Units by mouth 2 (two) times daily.   . Cranberry 500 MG CAPS Take 500 mg by mouth 2 (two) times daily.  . diphenoxylate-atropine (LOMOTIL) 2.5-0.025 MG tablet Take 1 tablet by mouth 2 (two) times daily as needed for diarrhea or loose stools.  . DULoxetine (CYMBALTA) 60 MG capsule Take 60 mg by mouth at bedtime.   Marland Kitchen esomeprazole (NEXIUM) 40 MG capsule Take 1 capsule (40 mg total) by mouth 2 (two) times daily. Take 30 minutes before breakfast and dinner.  Marland Kitchen EVZIO 0.4 MG/0.4ML SOAJ Inject 0.4 mLs as directed daily as needed (for overdose).   . fluticasone (FLONASE) 50 MCG/ACT nasal spray instill 2 sprays into each nostril once daily (Patient taking differently: Place 1 spray into both nostrils 2 (two) times daily. )  . HUMALOG KWIKPEN 100 UNIT/ML KiwkPen Inject 18-32 Units into the skin 3 (three) times daily with meals. PER Patient home SLIDING SCALE 18 units if CBG < 200 32 units  If CBG  > 200  . Insulin Glargine (LANTUS SOLOSTAR) 100 UNIT/ML Solostar Pen Inject 40 Units into the skin at bedtime. (Patient taking differently: Inject 50 Units into the skin at bedtime. )  . irbesartan (AVAPRO) 75 MG  tablet Take 1 tablet by mouth daily.  Marland Kitchen loperamide (IMODIUM A-D) 2 MG tablet Take 1 tablet (2 mg total) by mouth 4 (four) times daily as needed for diarrhea or loose stools.  Marland Kitchen loratadine (CLARITIN) 10 MG tablet Take 1 tablet (10 mg total) by mouth daily.  . magnesium oxide (MAG-OX) 400 MG tablet Take 1 tablet (400 mg total) by mouth 2 (two) times daily. (Patient taking differently: Take 400 mg by mouth 2 (two) times daily. )  . mometasone-formoterol (DULERA) 100-5 MCG/ACT AERO Inhale 2 puffs into the lungs every 12 (twelve) hours.  . mometasone-formoterol (DULERA) 200-5 MCG/ACT AERO Inhale 2 puffs into the lungs 2 (two) times daily. For 2 weeks, then resume your Dulera 100  . nitroGLYCERIN (NITROSTAT) 0.4 MG SL tablet Place  1 tablet (0.4 mg total) under the tongue every 5 (five) minutes as needed for chest pain (do nto exceed 3 doses). (Patient taking differently: Place 0.4 mg under the tongue every 5 (five) minutes x 3 doses as needed for chest pain. )  . ondansetron (ZOFRAN) 4 MG tablet Take 1 tablet (4 mg total) by mouth every 8 (eight) hours as needed for nausea or vomiting.  Marland Kitchen oxyCODONE-acetaminophen (PERCOCET) 10-325 MG tablet Take 1 tablet by mouth 5 (five) times daily. (scheduled)   . Polyvinyl Alcohol-Povidone (REFRESH OP) Place 1 drop into both eyes 3 (three) times daily as needed (for dry eyes).   . potassium chloride (K-DUR) 10 MEQ tablet take 1 tablet by mouth once daily (Patient taking differently: Take 10 MEQ by mouth once daily)  . pregabalin (LYRICA) 100 MG capsule Take 100 mg by mouth 3 (three) times daily.  . rivaroxaban (XARELTO) 20 MG TABS tablet Take 20 mg by mouth once a day with supper (Patient taking differently: Take 20 mg by mouth daily with supper. )  . tamsulosin (FLOMAX) 0.4 MG CAPS capsule Take 0.4 mg by mouth at bedtime.  . vitamin C (ASCORBIC ACID) 500 MG tablet Take 500 mg by mouth 2 (two) times daily.  . VOLTAREN 1 % GEL APPLY 2 INCHES TO AFFECTED AREA FOUR TIMES A  DAY (Patient taking differently: APPLY 2 INCHES TO AFFECTED AREA THREE TIMES A DAY)   No facility-administered encounter medications on file as of 01/18/2018.      Review of Systems  Constitutional:   No  weight loss, night sweats,  fevers, chills, fatigue, or  lassitude HEENT:   No headaches,  Difficulty swallowing,  Tooth/dental problems, or  Sore throat, No sneezing, itching, ear ache, nasal congestion, post nasal drip  CV: No chest pain,  orthopnea, PND, swelling in lower extremities, anasarca, dizziness, palpitations, syncope  GI: No heartburn, indigestion, abdominal pain, nausea, vomiting, diarrhea, change in bowel habits, loss of appetite, bloody stools Resp: No shortness of breath with exertion or at rest.  No excess mucus, no productive cough,  No non-productive cough,  No coughing up of blood.  No change in color of mucus.  No wheezing.  No chest wall deformity Skin: no rash, lesions, no skin changes. GU: no dysuria, change in color of urine, no urgency or frequency.  No flank pain, no hematuria  MS:  No joint pain or swelling.  No decreased range of motion.  No back pain. Psych:  No change in mood or affect. No depression or anxiety.  No memory loss.   Physical Exam  There were no vitals taken for this visit.  GEN: A/Ox3; pleasant , NAD, well nourished    HEENT:  [place]/AT,  EACs-clear, TMs-wnl, NOSE-clear, THROAT-clear, no lesions, no postnasal drip or exudate noted.   NECK:  Supple w/ fair ROM; no JVD; normal carotid impulses w/o bruits; no thyromegaly or nodules palpated; no lymphadenopathy.    RESP:  Clear  P & A; w/o, wheezes/ rales/ or rhonchi. no accessory muscle use, no dullness to percussion  CARD:  RRR, no m/r/g, no peripheral edema, pulses intact, no cyanosis or clubbing.  GI:   Soft & nt; nml bowel sounds; no organomegaly or masses detected.   Musco: Warm bil, no deformities or joint swelling noted.   Neuro: alert, no focal deficits noted.    Skin: Warm, no  lesions or rashes    Lab Results:  CBC    Component Value Date/Time   WBC  4.2 11/25/2017 0448   RBC 4.06 11/25/2017 0448   HGB 11.7 (L) 11/25/2017 0448   HGB 13.3 11/05/2017 1051   HCT 36.9 11/25/2017 0448   HCT 39.8 11/05/2017 1051   PLT 166 11/25/2017 0448   PLT 198 11/05/2017 1051   MCV 90.9 11/25/2017 0448   MCV 87 11/05/2017 1051   MCH 28.8 11/25/2017 0448   MCHC 31.7 11/25/2017 0448   RDW 14.6 11/25/2017 0448   RDW 14.3 11/05/2017 1051   LYMPHSABS 1.4 11/05/2017 1051   MONOABS 0.3 10/07/2017 1438   EOSABS 0.1 11/05/2017 1051   BASOSABS 0.0 11/05/2017 1051    BMET    Component Value Date/Time   NA 136 11/25/2017 0448   NA 138 11/05/2017 1051   K 4.4 11/25/2017 0448   CL 101 11/25/2017 0448   CO2 25 11/25/2017 0448   GLUCOSE 195 (H) 11/25/2017 0448   BUN 16 11/25/2017 0448   BUN 17 11/05/2017 1051   CREATININE 1.51 (H) 11/25/2017 0448   CREATININE 1.23 (H) 10/21/2016 1133   CALCIUM 9.1 11/25/2017 0448   GFRNONAA 35 (L) 11/25/2017 0448   GFRNONAA 46 (L) 10/21/2016 1133   GFRAA 41 (L) 11/25/2017 0448   GFRAA 54 (L) 10/21/2016 1133    BNP    Component Value Date/Time   BNP 196.8 (H) 09/05/2014 1636    ProBNP    Component Value Date/Time   PROBNP 617.3 (H) 08/02/2014 0617    Imaging: No results found.   Assessment & Plan:   No problem-specific Assessment & Plan notes found for this encounter.     Coral Ceo, NP 01/18/2018

## 2018-01-19 ENCOUNTER — Ambulatory Visit: Payer: Medicare Other | Admitting: Internal Medicine

## 2018-01-20 ENCOUNTER — Ambulatory Visit: Payer: Medicare Other | Admitting: Rehabilitation

## 2018-01-20 ENCOUNTER — Encounter: Payer: Self-pay | Admitting: Rehabilitation

## 2018-01-20 VITALS — BP 112/67

## 2018-01-20 DIAGNOSIS — R293 Abnormal posture: Secondary | ICD-10-CM

## 2018-01-20 DIAGNOSIS — R2689 Other abnormalities of gait and mobility: Secondary | ICD-10-CM

## 2018-01-20 DIAGNOSIS — G8929 Other chronic pain: Secondary | ICD-10-CM

## 2018-01-20 DIAGNOSIS — M5441 Lumbago with sciatica, right side: Secondary | ICD-10-CM

## 2018-01-20 DIAGNOSIS — M6281 Muscle weakness (generalized): Secondary | ICD-10-CM

## 2018-01-20 NOTE — Patient Instructions (Signed)
Access Code: [email or web address]  URL: [email or web address]  Date: 01/20/2018  Prepared by: Harriet ButteEmily Jariah Tarkowski   Exercises  Seated Hamstring Stretch - 10 reps - 3 sets - 30 hold - 3x daily - 7x weekly  Supine Bridge - 10 reps - 1 sets - 1x daily - 5x weekly  Hooklying Small March - 10 reps - 2 sets - 1x daily - 5x weekly  Seated Piriformis Stretch - 10 reps - 3 sets - 30 hold - 3x daily - 7x weekly  Supine Lower Trunk Rotation - 3 reps - 1 sets - 1x daily - 7x weekly  Romberg Stance - 3 reps - 1 sets - 30 hold - 1x daily - 7x weekly  Romberg Stance with Head Nods - 3 reps - 1 sets - 1 hold - 1x daily - 7x weekly  Romberg Stance with Eyes Closed - 3 reps - 1 sets - 20 hold - 1x daily - 7x weekly  Tandem Stance with Support - 3 reps - 1 sets - 30 hold - 1x daily - 7x weekly  Standing Single Leg Stance with Unilateral Counter Support - 3 reps - 1 sets - 10 hold - 1x daily - 7x weekly

## 2018-01-20 NOTE — Therapy (Signed)
[facility] [address] [place], [place], [address] Phone: [phone]   Fax:  [phone]  Physical Therapy Treatment  Patient Details  Name: [name] MRN: [id number] Date of Birth: Jul 09, 1951 Referring Provider: Dr. Emmaline Life  (Dr. Leta Baptist (neurologist) can't get in until 05/2018)   Encounter Date: 01/20/2018  PT End of Session - 01/20/18 1201    Visit Number  7    Number of Visits  9    Date for PT Re-Evaluation  02/20/18    Authorization Type  [employer]HC Medicare primary and Medicaid secondary    PT Start Time  1107 pt late to session    PT Stop Time  1146    PT Time Calculation (min)  39 min    Equipment Utilized During Treatment  Gait belt    Activity Tolerance  Patient tolerated treatment well    Behavior During Therapy  [facility] for tasks assessed/performed       Past Medical History:  Diagnosis Date  . Anxiety   . Arthritis    "knees, hands, ankles, feet" (05/12/2016)  . Asthma    Dr. Melvyn Novas  . Bipolar disorder ([place])   . Chronic back pain    "lower and middle" (05/12/2016)  . CKD (chronic kidney disease), stage III ([place])   . Colon polyps   . Depression   . Diverticulosis   . Epilepsy ([place])   . Essential hypertension   . Fibromyalgia   . Gallstones   . GERD (gastroesophageal reflux disease)   . H/O hiatal hernia   . Hyperlipidemia   . IBS (irritable bowel syndrome)   . Paroxysmal A-fib ([place])    failed medical therapy with tikosyn, s/p AF ablation x 2  . Peripheral neuropathy   . Sarcoidosis of lung ([place])    Dr. Melvyn Novas  . Seizures ([place])    "epileptic; pretty regular recently" (05/12/2016)  . Syncope   . Type II diabetes mellitus ([place])     Past Surgical History:  Procedure Laterality Date  . ABDOMINAL HYSTERECTOMY  1995  . ANTERIOR CERVICAL DECOMP/DISCECTOMY FUSION  07/11/2012   Procedure: ANTERIOR CERVICAL DECOMPRESSION/DISCECTOMY FUSION 1 LEVEL;  Surgeon: Ophelia Charter, MD;  Location: [facility]UR[facility]  ORS;  Service: Neurosurgery;  Laterality: N/A;  Cervical Five-Six Anterior Cervical Decompression with Fusion Interbody Prothesis Plating and Bonegraft  . ATRIAL FIBRILLATION ABLATION N/A 05/18/2017   Procedure: Atrial Fibrillation Ablation;  Surgeon: [place] Grayer, MD;  Location: [facility] CV LAB;  Service: Cardiovascular;  Laterality: N/A;  . ATRIAL FIBRILLATION ABLATION N/A 11/16/2017   Procedure: ATRIAL FIBRILLATION ABLATION;  Surgeon: [place] Grayer, MD;  Location: [facility] CV LAB;  Service: Cardiovascular;  Laterality: N/A;  . CARDIAC CATHETERIZATION  2012   a. Normal coronaries 2012. b. Normal nuc 09/2014.  Marland Kitchen CARDIAC CATHETERIZATION  05/18/2017  . CHOLECYSTECTOMY OPEN  1976  . CLOSED REDUCTION ANKLE FRACTURE Left 10/2006   "got steel rod in my leg; and screws"  . COLON SURGERY    . DILATION AND CURETTAGE OF UTERUS    . ESOPHAGEAL MANOMETRY  03/21/2012   Procedure: ESOPHAGEAL MANOMETRY (EM);  Surgeon: Milus Banister, MD;  Location: [facility];  Service: Endoscopy;  Laterality: N/A;  . FRACTURE SURGERY    . NEUROPLASTY / TRANSPOSITION MEDIAN NERVE AT CARPAL TUNNEL Left 2004  . NEUROPLASTY / TRANSPOSITION MEDIAN NERVE AT CARPAL TUNNEL Right 2002  . 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 SALPINGOOPHORECTOMY Bilateral 2000  . TEE WITHOUT CARDIOVERSION N/A  05/18/2017   Procedure: TRANSESOPHAGEAL ECHOCARDIOGRAM (TEE);  Surgeon: Lelon Perla, MD;  Location: [facility];  Service: Cardiovascular;  Laterality: N/A;  . [place]:   01/20/18 1109  BP: 112/67    Subjective Assessment - 01/20/18 1109    Subjective  Pt reports using cane more and presents today with cane.     Pertinent History  HTN, syncope, orthostatic hypotension, a-fib, asthma, bipolar, anxiety, depression, DM, seizures, sarcoidosis of lungs, CKD, HLD, chronic back pain, arthritis, fibromyalgia, peripheral neuropathy (B UE/LE)    Patient Stated Goals  Try to get some strength in my body,  improve balance, walk longer distances    Currently in Pain?  Yes    Pain Score  7     Pain Location  Generalized    Pain Orientation  Lower    Pain Descriptors / Indicators  Aching    Pain Type  Neuropathic pain    Pain Onset  More than a month ago    Pain Frequency  Constant    Aggravating Factors   increased mobility    Pain Relieving Factors  lyrica                 Access Code: [email or web address]  URL: [email or web address]  Date: 01/20/2018  Prepared by: Cameron Sprang   Exercises  Seated Hamstring Stretch - 10 reps - 3 sets - 30 hold - 3x daily - 7x weekly  Supine Bridge - 10 reps - 1 sets - 1x daily - 5x weekly  Hooklying Small March - 10 reps - 2 sets - 1x daily - 5x weekly  Seated Piriformis Stretch - 10 reps - 3 sets - 30 hold - 3x daily - 7x weekly  Supine Lower Trunk Rotation - 3 reps - 1 sets - 1x daily - 7x weekly  Romberg Stance - 3 reps - 1 sets - 30 hold - 1x daily - 7x weekly  Romberg Stance with Head Nods - 3 reps - 1 sets - 1 hold - 1x daily - 7x weekly  Romberg Stance with Eyes Closed - 3 reps - 1 sets - 20 hold - 1x daily - 7x weekly  Tandem Stance with Support - 3 reps - 1 sets - 30 hold - 1x daily - 7x weekly  Standing Single Leg Stance with Unilateral Counter Support - 3 reps - 1 sets - 10 hold - 1x daily - 7x weekly                 PT Education - 01/20/18 1200    Education provided  Yes    Education Details  continuing with current HEP    Person(s) Educated  Patient    Methods  Explanation;Handout;Demonstration    Comprehension  Verbalized understanding;Returned demonstration       PT Short Term Goals - 12/22/17 1632      PT SHORT TERM GOAL #1   Title  same as LTGs        PT Long Term Goals - 01/17/18 1327      PT LONG TERM GOAL #1   Title  Pt will be IND with HEP to improve balance, endurance, and strength. TARGET DATE FOR ALL LTGS: 01/19/18    Status  New      PT LONG TERM GOAL #2   Title  Pt will improve TUG  time with LRAD to </=13.5 sec. to decr. falls risk.     Baseline  17.12 first trial,  13.06 second trial-cues for safe turn.  (without AD)    Status  Achieved      PT LONG TERM GOAL #3   Title  Pt will improve gait speed to >/=2.36f/sec. to decr. falls risk and safely amb. in the community with [place]    Baseline  2.24 ft/sec on 01/17/18 (only demo'd improvement from baseline, did not meet goal)    Status  Partially Met      PT LONG TERM GOAL #4   Title  Pt will amb. 500' over even/paved surfaces at MOD I level with LRAD to improve functional mobility.     Baseline  S level on 01/17/18 with SBQC    Status  Partially Met      PT LONG TERM GOAL #5   Title  Perform DGI and write goal as indicated.     Status  Achieved      PT LONG TERM GOAL #6   Title  Pt will improve DGI score to >/=16/24 to decr. falls risk.     Status  New      PT LONG TERM GOAL #7   Title  Pt will improve 6MWT distance to 448' with LRAD to improve endurance.     Status  New            Plan - 01/20/18 1201    Clinical Impression Statement  Skilled session went over current HEP from MPoolerfor BLE strength, core strength and balance.  Pt appears to be compliant with HEP, however when actually performing HEP, they still seem mild to moderately difficult, therefore maintained current HEP.     Rehab Potential  Fair    Clinical Impairments Affecting Rehab Potential  see above.     PT Frequency  2x / week    PT Duration  4 weeks    PT Treatment/Interventions  ADLs/Self Care Home Management;Biofeedback;Therapeutic activities;Therapeutic exercise;Manual techniques;Vestibular;Orthotic Fit/Training;Functional mobility training;Stair training;Gait training;DME Instruction;Neuromuscular re-education;Balance training;Patient/family education    PT Next Visit Plan  Jenn-I'm going to let you make the decision whether to D/C her or renew, she missed some visits so has not met POC, and cert date good through 7/7-see what you  think(I would prep for DC), monitor BP with sessions; check to see if she started walking for 3 minutes at home, continue to work on endurance and gait training, LTGs due 01/19/18.     Consulted and Agree with Plan of Care  Patient       Patient will benefit from skilled therapeutic intervention in order to improve the following deficits and impairments:  Abnormal gait, Decreased endurance, Decreased knowledge of use of DME, Decreased strength, Decreased balance, Decreased mobility, Pain, Impaired flexibility, Postural dysfunction, Impaired sensation  Visit Diagnosis: Other abnormalities of gait and mobility  Muscle weakness (generalized)  Abnormal posture  Chronic right-sided low back pain with right-sided sciatica     Problem List Patient Active Problem List   Diagnosis Date Noted  . Cough due to ACE inhibitor 12/21/2017  . Gait abnormality 11/30/2017  . Cardiac syncope 11/22/2017  . Type II diabetes mellitus ([place]   . Bipolar disorder ([place]   . Atrial fibrillation ([place]   . Chronic back pain   . CKD (chronic kidney disease), stage III ([place]   . Depression   . Anxiety   . Paroxysmal A-fib ([place]   . Diarrhea 09/03/2017  . Estrogen deficiency 06/07/2017  . Healthcare maintenance 06/03/2017  . Paroxysmal atrial fibrillation ([place] 05/18/2017  . A-fib ([place]  05/05/2017  . Back pain 12/11/2016  . Pelvic pain 11/25/2016  . Chronic pain 09/08/2016  . Spells of decreased attentiveness 09/08/2016  . Persistent atrial fibrillation (HCC)   . Irritable bowel syndrome 03/03/2016  . Chronic leukopenia 09/13/2015  . PAF (paroxysmal atrial fibrillation) (HCC) 08/22/2015  . Morbid obesity (HCC) 07/11/2015  . Bipolar I disorder (HCC) 05/22/2015  . Chronic kidney disease (CKD), stage III (moderate) (HCC) 05/14/2015  . Insulin dependent diabetes mellitus (HCC)   . Seizure disorder (HCC)   . History of diverticulitis   . Syncope 06/29/2012  . Orthostatic hypotension 06/29/2012  .  History of colonic polyps 09/01/2011  . Sarcoid (HCC) 09/01/2011  . Asthma 09/01/2011  . GERD (gastroesophageal reflux disease) 09/01/2011  . 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 Diabetes mellitus type 2, uncontrolled, with complications (HCC) 11/19/2008  . Hyperlipidemia 11/19/2008  . Essential hypertension 11/19/2008    Emily Parcell, PT, MPT [facility] [address] [place], [place], [address] Phone: [phone]   Fax:  [phone] 01/20/18, 12:05 PM  Name: Briggett F Colunga MRN: [id number] Date of Birth: 07/02/1951   

## 2018-01-20 NOTE — Progress Notes (Signed)
 @Patient  ID: Toni RosenthalLinda F [name], female    DOB: December 09, 1950, 67 y.o.   MRN: [id number]  Chief Complaint  Patient presents with  . Follow-up    reports breathing is about the same, no new concerns.     Referring provider: Berton BonMikell, [name], MD  HPI: [id number]  yobf never smoker clinical dx with cough variant asthma and sarcoid here 1995 neg tbbx but classic cxr which gradually improved   Previously on chronic steroids but stopped in 2011, unable to tolerate .  PMH : A fib s/p Ablation-on Xarelto  HTN , DM    Recent [facility] Encounters:   12/07/2017 Acute OV : Cough  Patient presents for an acute office visit.  Complains of 3 days of cough with thick mucus , but hard to get up . Has increased  Wheezing and  dyspnea, .  Had to use proAir few times. Last seen in office 2017 . Says doing well until last few days ago .  CXR on 11/23/17 showed no acute process.  Says she used to have albuterol nebs at home but does not have a machine any longer.  Appetite is good. No n/v/d.   12/21/17 Arriving follow-up appointment today was last seen on 12/07/2017 with NP.  Was in an acute exacerbation and treated for bronchitis as well as allergic rhinitis flare.  Was continued on Dulera 200 puffs twice (daily rinse after use) until sample is gone and then resume 100 mg dose.  Ensure completed Augmentin 875 mg prescription for 7 days.   Recently was started on lisinopril 5 mg daily on 12/17/17 by endocrinology for renal protection as presented with an increased cough with this prescription.  Patient has concerns if the lisinopril has increased her coughing.  Patient reports feeling much better from 4/23 appointment.  Patient states the cough is no longer productive and not feeling as fatigued or short of breath.  Patient does have dry cough as of 3 days ago when starting lisinopril. [facility] twice a day as instructed. Typically using SABA 2x a day.  Last ED admission was on 11/22/2017 for chronic pain.  Allergy  to prednisone. Last chest x-ray was 11/23/2017. Last pulmonary function tests November 2016. Up to date with vaccinations.  Patient to switch from Dr. Sherene SiresWert to Dr. Isaiah SergeMannam  Tests:   Imaging:  11/23/2017-chest x-ray- mild left base subsegmental atelectasis and/or scarring  Cardiac:  11/23/2017-echocardiogram-LV ejection fraction 60 to 65%, systolic function was normal  Chart Review:  11/23/2017-EEG-normal EEG in the awake, drowsy, sleep states without any epileptiform abnormalities noted     01/21/18 OV Pleasant 67 year old patient seen in office today for follow-up.  Patient reporting doing quite well feeling much better since recent flare.  Patient is adherent to her Dulera 100 and as needed to use her rescue inhaler about 3-4 times a week.  Patient does feel that she sometimes has an occasional wheeze and did feel that she was breathing better with the [facility] when she was put on that for the flareup.  Patient will establish with Dr. Isaiah SergeMannam at next visit.  Patient also of note reporting that she is in physical therapy, doing well and plans on joining the [facility] [employer] [facility] afterwards to continue working on healthy weight and improving on her fatigue.   Allergies  Allergen Reactions  . Prednisone Other (See Comments)    SENT PATIENT INTO A-FIB   . Amitriptyline Other (See Comments)    Made the patient disoriented  . Hydromorphone Hcl  Other (See Comments)    Made the blood pressure drop (HYPOtension)  . Lisinopril Cough    Immunization History  Administered Date(s) Administered  . Influenza, High Dose Seasonal PF 05/19/2017  . Influenza,inj,Quad PF,6+ Mos 05/02/2015, 05/13/2016  . Influenza-Unspecified 05/18/2011, 05/17/2014  . Pneumococcal Conjugate-13 09/13/2015  . Pneumococcal Polysaccharide-23 07/12/2012, 11/17/2017  . Tdap 05/22/2015    Past Medical History:  Diagnosis Date  . Anxiety   . Arthritis    "knees, hands, ankles, feet" (05/12/2016)  . Asthma    Dr. [name]  . Bipolar  disorder (HCC)   . Chronic back pain    "lower and middle" (05/12/2016)  . CKD (chronic kidney disease), stage III (HCC)   . Colon polyps   . Depression   . Diverticulosis   . Epilepsy (HCC)   . Essential hypertension   . Fibromyalgia   . Gallstones   . GERD (gastroesophageal reflux disease)   . H/O hiatal hernia   . Hyperlipidemia   . IBS (irritable bowel syndrome)   . Paroxysmal A-fib (HCC)    failed medical therapy with tikosyn, s/p AF ablation x 2  . Peripheral neuropathy   . Sarcoidosis of lung (HCC)    Dr. [name]  . Seizures (HCC)    "epileptic; pretty regular recently" (05/12/2016)  . Syncope   . Type II diabetes mellitus (HCC)     Tobacco History: Social History   Tobacco Use  Smoking Status Never Smoker  Smokeless Tobacco Never Used   Counseling given: Not Answered Continue not smoking.  Outpatient Encounter Medications as of 01/21/2018  Medication Sig  . albuterol (PROAIR HFA) 108 (90 Base) MCG/ACT inhaler inhale 2 puffs every 6 hours if needed for wheezing or shortness of breath  . albuterol (PROVENTIL) (2.5 MG/3ML) 0.083% nebulizer solution Take 3 mLs (2.5 mg total) by nebulization every 4 (four) hours as needed for wheezing or shortness of breath (dx: J45.31).  Marland Kitchen amiodarone (PACERONE) 200 MG tablet Take 1 tablet (200 mg total) by mouth daily.  Marland Kitchen amLODipine (NORVASC) 10 MG tablet TAKE 1 TABLET BY MOUTH EVERY DAY  . atorvastatin (LIPITOR) 80 MG tablet Take 80 mg by mouth at bedtime.  . calcium carbonate (OS-CAL) 600 MG TABS tablet Take 600 mg by mouth daily.  . cholecalciferol (VITAMIN D) 1000 UNITS tablet Take 1,000 Units by mouth 2 (two) times daily.   . Cranberry 500 MG CAPS Take 500 mg by mouth 2 (two) times daily.  . diphenoxylate-atropine (LOMOTIL) 2.5-0.025 MG tablet Take 1 tablet by mouth 2 (two) times daily as needed for diarrhea or loose stools.  . DULoxetine (CYMBALTA) 60 MG capsule Take 60 mg by mouth at bedtime.   Marland Kitchen esomeprazole (NEXIUM) 40 MG capsule  Take 1 capsule (40 mg total) by mouth 2 (two) times daily. Take 30 minutes before breakfast and dinner.  Marland Kitchen EVZIO 0.4 MG/0.4ML SOAJ Inject 0.4 mLs as directed daily as needed (for overdose).   . fluticasone (FLONASE) 50 MCG/ACT nasal spray instill 2 sprays into each nostril once daily (Patient taking differently: Place 1 spray into both nostrils 2 (two) times daily. )  . HUMALOG KWIKPEN 100 UNIT/ML KiwkPen Inject 18-32 Units into the skin 3 (three) times daily with meals. PER Patient home SLIDING SCALE 18 units if CBG < 200 32 units  If CBG  > 200  . Insulin Glargine (LANTUS SOLOSTAR) 100 UNIT/ML Solostar Pen Inject 40 Units into the skin at bedtime. (Patient taking differently: Inject 50 Units into the skin at bedtime. )  .  irbesartan (AVAPRO) 75 MG tablet Take 1 tablet by mouth daily.  Marland Kitchen loperamide (IMODIUM A-D) 2 MG tablet Take 1 tablet (2 mg total) by mouth 4 (four) times daily as needed for diarrhea or loose stools.  Marland Kitchen loratadine (CLARITIN) 10 MG tablet Take 1 tablet (10 mg total) by mouth daily.  . magnesium oxide (MAG-OX) 400 MG tablet Take 1 tablet (400 mg total) by mouth 2 (two) times daily. (Patient taking differently: Take 400 mg by mouth 2 (two) times daily. )  . nitroGLYCERIN (NITROSTAT) 0.4 MG SL tablet Place 1 tablet (0.4 mg total) under the tongue every 5 (five) minutes as needed for chest pain (do nto exceed 3 doses). (Patient taking differently: Place 0.4 mg under the tongue every 5 (five) minutes x 3 doses as needed for chest pain. )  . ondansetron (ZOFRAN) 4 MG tablet Take 1 tablet (4 mg total) by mouth every 8 (eight) hours as needed for nausea or vomiting.  Marland Kitchen oxyCODONE-acetaminophen (PERCOCET) 10-325 MG tablet Take 1 tablet by mouth 5 (five) times daily. (scheduled)   . Polyvinyl Alcohol-Povidone (REFRESH OP) Place 1 drop into both eyes 3 (three) times daily as needed (for dry eyes).   . potassium chloride (K-DUR) 10 MEQ tablet take 1 tablet by mouth once daily (Patient taking  differently: Take 10 MEQ by mouth once daily)  . pregabalin (LYRICA) 100 MG capsule Take 100 mg by mouth 3 (three) times daily.  . rivaroxaban (XARELTO) 20 MG TABS tablet Take 20 mg by mouth once a day with supper (Patient taking differently: Take 20 mg by mouth daily with supper. )  . tamsulosin (FLOMAX) 0.4 MG CAPS capsule Take 0.4 mg by mouth at bedtime.  . vitamin C (ASCORBIC ACID) 500 MG tablet Take 500 mg by mouth 2 (two) times daily.  . VOLTAREN 1 % GEL APPLY 2 INCHES TO AFFECTED AREA FOUR TIMES A DAY (Patient taking differently: APPLY 2 INCHES TO AFFECTED AREA THREE TIMES A DAY)  . [DISCONTINUED] mometasone-formoterol (DULERA) 100-5 MCG/ACT AERO Inhale 2 puffs into the lungs every 12 (twelve) hours.  . mometasone-formoterol (DULERA) 200-5 MCG/ACT AERO Inhale 2 puffs into the lungs 2 (two) times daily.  . [DISCONTINUED] mometasone-formoterol (DULERA) 200-5 MCG/ACT AERO Inhale 2 puffs into the lungs 2 (two) times daily. For 2 weeks, then resume your Dulera 100 (Patient not taking: Reported on 01/21/2018)   No facility-administered encounter medications on file as of 01/21/2018.      Review of Systems  Constitutional:   No  weight loss, night sweats,  fevers, chills, fatigue, or  lassitude HEENT:   No headaches,  Difficulty swallowing,  Tooth/dental problems, or  Sore throat, No sneezing, itching, ear ache, nasal congestion, post nasal drip  CV: No chest pain,  orthopnea, PND, swelling in lower extremities, anasarca, dizziness, palpitations, syncope  GI: No heartburn, indigestion, abdominal pain, nausea, vomiting, diarrhea, change in bowel habits, loss of appetite, bloody stools Resp: +occasional wheeze, sob with exertion at times  No shortness of breath at rest.  No excess mucus, no productive cough,  No non-productive cough,  No coughing up of blood.  No change in color of mucus.  No wheezing.  No chest wall deformity Skin: no rash, lesions, no skin changes. GU: no dysuria, change in color  of urine, no urgency or frequency.  No flank pain, no hematuria  MS:  No joint pain or swelling.  No decreased range of motion.  No back pain. Psych:  No change in mood or  affect. No depression or anxiety.  No memory loss.   Physical Exam  BP (!) 142/82   Pulse 67   Ht 5\' 7"  (1.702 m)   Wt 221 lb 12.8 oz (100.6 kg)   SpO2 98%   BMI 34.74 kg/m    Wt Readings from Last 3 Encounters:  01/21/18 221 lb 12.8 oz (100.6 kg)  01/05/18 217 lb 3.2 oz (98.5 kg)  01/03/18 214 lb (97.1 kg)     GEN: A/Ox3; pleasant , NAD, well nourished    HEENT:  [place]/AT,  EACs-clear, TMs-wnl, NOSE-clear, THROAT-clear, no lesions, no postnasal drip or exudate noted.   NECK:  Supple w/ fair ROM; no JVD; normal carotid impulses w/o bruits; no lymphadenopathy.    RESP: Clear  P & A; good air movement in all lobes, w/o, wheezes/ rales/ or rhonchi. no accessory muscle use, no dullness to percussion  CARD:  RRR, no m/r/g, no peripheral edema, pulses intact, no cyanosis or clubbing.  GI:   Soft & nt; nml bowel sounds; no organomegaly or masses detected.   Musco: Warm bil, no deformities or joint swelling noted.   Neuro: alert, no focal deficits noted.    Skin: Warm, no lesions or rashes    Lab Results:  CBC    Component Value Date/Time   WBC 4.2 11/25/2017 0448   RBC 4.06 11/25/2017 0448   HGB 11.7 (L) 11/25/2017 0448   HGB 13.3 11/05/2017 1051   HCT 36.9 11/25/2017 0448   HCT 39.8 11/05/2017 1051   PLT 166 11/25/2017 0448   PLT 198 11/05/2017 1051   MCV 90.9 11/25/2017 0448   MCV 87 11/05/2017 1051   MCH 28.8 11/25/2017 0448   MCHC 31.7 11/25/2017 0448   RDW 14.6 11/25/2017 0448   RDW 14.3 11/05/2017 1051   LYMPHSABS 1.4 11/05/2017 1051   MONOABS 0.3 10/07/2017 1438   EOSABS 0.1 11/05/2017 1051   BASOSABS 0.0 11/05/2017 1051    BMET    Component Value Date/Time   NA 136 11/25/2017 0448   NA 138 11/05/2017 1051   K 4.4 11/25/2017 0448   CL 101 11/25/2017 0448   CO2 25 11/25/2017  0448   GLUCOSE 195 (H) 11/25/2017 0448   BUN 16 11/25/2017 0448   BUN 17 11/05/2017 1051   CREATININE 1.51 (H) 11/25/2017 0448   CREATININE 1.23 (H) 10/21/2016 1133   CALCIUM 9.1 11/25/2017 0448   GFRNONAA 35 (L) 11/25/2017 0448   GFRNONAA 46 (L) 10/21/2016 1133   GFRAA 41 (L) 11/25/2017 0448   GFRAA 54 (L) 10/21/2016 1133    BNP    Component Value Date/Time   BNP 196.8 (H) 09/05/2014 1636    ProBNP    Component Value Date/Time   PROBNP 617.3 (H) 08/02/2014 0617    Imaging: No results found.   Assessment & Plan:   Less than 35 year old patient.  Doing much better post flare.  Will increase the Dulera to [facility] 200 at this time.  Patient disease how she does have this improves her symptoms as well as overall quality of life.  Patient plans to discuss this with Dr. Isaiah Serge in 6 weeks.  Patient aware that this appointment may be longer as she is establishing care with Dr. Isaiah Serge.  In the meantime patient to continue using rescue inhaler as needed.  To notify our office if she is needing to use it more often.  And to follow-up with our office if she has concerns regarding her respiratory status.  Asthma Increase  Dulera 100------>200 discussed with patient We will follow-up with Dr. Isaiah Serge in 6 weeks to establish with his care Continue rescue inhaler as needed   Sarcoid (HCC) Stable at this time Follow-up with Dr. Isaiah Serge in 6 weeks   Morbid obesity (HCC) Keep up your hard work in physical therapy and then eventually at the [facility] Continue working towards a healthy weight     Coral Ceo, NP 01/21/2018

## 2018-01-21 ENCOUNTER — Ambulatory Visit (INDEPENDENT_AMBULATORY_CARE_PROVIDER_SITE_OTHER): Payer: Medicare Other | Admitting: Pulmonary Disease

## 2018-01-21 ENCOUNTER — Encounter: Payer: Self-pay | Admitting: Pulmonary Disease

## 2018-01-21 VITALS — BP 142/82 | HR 67 | Ht 67.0 in | Wt 221.8 lb

## 2018-01-21 DIAGNOSIS — J4530 Mild persistent asthma, uncomplicated: Secondary | ICD-10-CM

## 2018-01-21 DIAGNOSIS — D869 Sarcoidosis, unspecified: Secondary | ICD-10-CM

## 2018-01-21 MED ORDER — MOMETASONE FURO-FORMOTEROL FUM 200-5 MCG/ACT IN AERO: 2.0000 | INHALATION_SPRAY | Freq: Two times a day (BID) | RESPIRATORY_TRACT | 3 refills | Status: DC

## 2018-01-21 NOTE — Assessment & Plan Note (Signed)
Stable at this time Follow-up with Dr. Isaiah SergeMannam in 6 weeks

## 2018-01-21 NOTE — Assessment & Plan Note (Signed)
Keep up your hard work in physical therapy and then [facility] at [facility] Continue working towards a healthy weight

## 2018-01-21 NOTE — Patient Instructions (Addendum)
Continue follow-up with our office We will see back in 6 weeks to establish with Dr. Velvet BatheMannam Continue Dulera  >>>[facility] Continue rescue inhaler    Please contact the office if your symptoms worsen or you have concerns that you are not improving.   Thank you for choosing [facility] for your healthcare, and for allowing us to partner with you on your healthcare journey. I am thankful to be able to provide care to you today.   Elisha HeadlandBrian Hance Caspers FNP-C

## 2018-01-21 NOTE — Assessment & Plan Note (Signed)
Increase Dulera 100------>200 discussed with patient We will follow-up with Dr. Isaiah SergeMannam in 6 weeks to establish with his care Continue rescue inhaler as needed

## 2018-01-25 ENCOUNTER — Ambulatory Visit: Payer: Medicare Other

## 2018-01-26 ENCOUNTER — Ambulatory Visit (INDEPENDENT_AMBULATORY_CARE_PROVIDER_SITE_OTHER): Payer: Medicare Other | Admitting: Internal Medicine

## 2018-01-26 ENCOUNTER — Other Ambulatory Visit: Payer: Self-pay

## 2018-01-26 ENCOUNTER — Ambulatory Visit (HOSPITAL_COMMUNITY)
Admission: RE | Admit: 2018-01-26 | Discharge: 2018-01-26 | Disposition: A | Discharge status: Home / Self Care | Payer: Medicare Other | Source: Ambulatory Visit | Attending: Family Medicine | Admitting: Family Medicine

## 2018-01-26 ENCOUNTER — Encounter: Payer: Self-pay | Admitting: Internal Medicine

## 2018-01-26 VITALS — BP 102/70 | HR 66 | Temp 97.4°F | Wt 215.2 lb

## 2018-01-26 DIAGNOSIS — R110 Nausea: Secondary | ICD-10-CM | POA: Insufficient documentation

## 2018-01-26 DIAGNOSIS — R9431 Abnormal electrocardiogram [ECG] [EKG]: Secondary | ICD-10-CM | POA: Diagnosis not present

## 2018-01-26 MED ORDER — ESOMEPRAZOLE MAGNESIUM 40 MG PO CPDR
40.0000 mg | DELAYED_RELEASE_CAPSULE | Freq: Two times a day (BID) | ORAL | 3 refills | Status: DC
Start: 2018-01-26 — End: 2018-05-22

## 2018-01-26 NOTE — Progress Notes (Signed)
   [facility] Toni CharsAsiyah Woods Gangemi, MD Phone: [phone]  Reason For Visit: SDA for Nausea   # Patient states that two days ago she felt ill while at the pain management center. She was overheated at that time. Patient felt dizzy at that time. Patient states her main issue is her nausea.  States that she has been feeling nauseated for the past 3 days.  Some mild cough and congestion though nothing significantly abnormal. No fevers. Cough is not productive. Denies any abdominal pain Indicates having headaches at times but this is not currently present.  Denies any chest pain or shortness of breath.  Patient has recently run out of her Nexium and has pretty severe reflux.  She realized while we were discussing her symptoms that this actually seems quite similar to her history of reflux.  No dysuria. No increased frequency or urgency.   Past Medical History Reviewed problem list.  Medications- reviewed and updated No additions to family history Social history- patient is a nonsmoker  Objective: BP 102/70 (BP Location: Left Arm, Patient Position: Sitting, Cuff Size: Normal)   Pulse 66   Temp (!) 97.4 F (36.3 C) (Oral)   Wt 215 lb 3.2 oz (97.6 kg)   SpO2 98%   BMI 33.71 kg/m  Gen: NAD, alert, cooperative with exam HEENT: Normal    Neck: No masses palpated. No lymphadenopathy    Ears: Tympanic membranes intact, normal light reflex, no erythema, no bulging    Nose: mildly congesting noted.     Throat: moist mucus membranes, no erythema Cardio: regular rate and rhythm, S1S2 heard, no murmurs appreciated Pulm: clear to auscultation bilaterally, no wheezes, rhonchi or rales GI: soft, non-tender, non-distended, bowel sounds present, no hepatomegaly, no splenomegaly Skin: dry, intact, no rashes or lesions   Assessment/Plan: See problem based a/p  Nausea without vomiting Likely rebound nausea  off her Nexium, patient noted some symptoms of dizziness and has a history of  uncontrolled A. fib did obtain an EKG which was completely normal.  Will restart Nexium if patient does not improve on this further follow-up -GI cocktail today  - EKG 12-Lead - esomeprazole (NEXIUM) 40 MG capsule; Take 1 capsule (40 mg total) by mouth 2 (two) times daily. Take 30 minutes before breakfast and dinner.  Dispense: 60 capsule; Refill: 3 - CBC - Comprehensive metabolic panel -Follow-up in 1 week to recheck blood pressure as it is on the low side today-however patient has not been eating or drinking as much due to the nausea -if still low at next visit will decrease blood pressure medication

## 2018-01-26 NOTE — Telephone Encounter (Signed)
Pt saw Elisha HeadlandBrian Mack, NP 01/21/18.  Pt's next appt is scheduled with Dr. Isaiah SergeMannam in a 30min slot due to pt switching from [facility] to Dr. Isaiah SergeMannam.  Nothing needed at this current time.

## 2018-01-26 NOTE — Patient Instructions (Signed)
Think that your symptoms are likely due to reflux.  I will want you to restart taking your Nexium.  Your EKG was reassuring.  We will obtain blood work to make sure there is nothing serious going on. Follow up if no improvement after restarting your Nexium

## 2018-01-26 NOTE — Assessment & Plan Note (Addendum)
Likely rebound nausea  off her Nexium, patient noted some symptoms of dizziness and has a history of uncontrolled A. fib did obtain an EKG which was completely normal.  Will restart Nexium if patient does not improve on this further follow-up -GI cocktail today  - EKG 12-Lead - esomeprazole (NEXIUM) 40 MG capsule; Take 1 capsule (40 mg total) by mouth 2 (two) times daily. Take 30 minutes before breakfast and dinner.  Dispense: 60 capsule; Refill: 3 - CBC - Comprehensive metabolic panel -Follow-up in 1 week to recheck blood pressure as it is on the low side today-however patient has not been eating or drinking as much due to the nausea -if still low at next visit will decrease blood pressure medication

## 2018-01-27 ENCOUNTER — Encounter: Payer: Self-pay | Admitting: Internal Medicine

## 2018-01-27 ENCOUNTER — Other Ambulatory Visit: Payer: Self-pay | Admitting: Internal Medicine

## 2018-01-27 ENCOUNTER — Ambulatory Visit: Payer: Medicare Other

## 2018-01-27 VITALS — BP 136/80 | HR 68

## 2018-01-27 DIAGNOSIS — R2689 Other abnormalities of gait and mobility: Secondary | ICD-10-CM | POA: Diagnosis not present

## 2018-01-27 DIAGNOSIS — M6281 Muscle weakness (generalized): Secondary | ICD-10-CM

## 2018-01-27 DIAGNOSIS — R293 Abnormal posture: Secondary | ICD-10-CM

## 2018-01-27 LAB — COMPREHENSIVE METABOLIC PANEL
ALK PHOS: 101 IU/L (ref 39–117)
ALT: 22 IU/L (ref 0–32)
AST: 23 IU/L (ref 0–40)
Albumin/Globulin Ratio: 1.5 (ref 1.2–2.2)
Albumin: 4.6 g/dL (ref 3.6–4.8)
BUN/Creatinine Ratio: 9 — ABNORMAL LOW (ref 12–28)
BUN: 13 mg/dL (ref 8–27)
Bilirubin Total: 0.4 mg/dL (ref 0.0–1.2)
CALCIUM: 9.5 mg/dL (ref 8.7–10.3)
CO2: 25 mmol/L (ref 20–29)
CREATININE: 1.38 mg/dL — AB (ref 0.57–1.00)
Chloride: 102 mmol/L (ref 96–106)
GFR calc Af Amer: 46 mL/min/{1.73_m2} — ABNORMAL LOW (ref >59)
GFR, EST NON AFRICAN AMERICAN: 40 mL/min/{1.73_m2} — AB (ref >59)
GLOBULIN, TOTAL: 3 g/dL (ref 1.5–4.5)
GLUCOSE: 154 mg/dL — AB (ref 65–99)
Potassium: 4.5 mmol/L (ref 3.5–5.2)
SODIUM: 142 mmol/L (ref 134–144)
Total Protein: 7.6 g/dL (ref 6.0–8.5)

## 2018-01-27 LAB — CBC
HEMATOCRIT: 37.2 % (ref 34.0–46.6)
Hemoglobin: 12.5 g/dL (ref 11.1–15.9)
MCH: 29.1 pg (ref 26.6–33.0)
MCHC: 33.6 g/dL (ref 31.5–35.7)
MCV: 87 fL (ref 79–97)
Platelets: 205 10*3/uL (ref 150–450)
RBC: 4.3 x10E6/uL (ref 3.77–5.28)
RDW: 14.3 % (ref 12.3–15.4)
WBC: 3.1 10*3/uL — AB (ref 3.4–10.8)

## 2018-01-27 NOTE — Therapy (Signed)
[facility] [address] [place], [place], [address] Phone: [phone]   Fax:  [phone]  Physical Therapy Treatment  Patient Details  Name: [name] MRN: [id number] Date of Birth: [birth year] Referring Provider: Dr. Emmaline Life  (Dr. Leta Baptist (neurologist) can't get in until 05/2018)   Encounter Date: 01/27/2018  PT End of Session - 01/27/18 1056    Visit Number  8    Number of Visits  9    Date for PT Re-Evaluation  02/20/18    Authorization Type  [employer]HC Medicare primary and Medicaid secondary    PT Start Time  35    PT Stop Time  1053 d/c    PT Time Calculation (min)  34 min    Equipment Utilized During Treatment  Gait belt    Activity Tolerance  Patient tolerated treatment well;Patient limited by fatigue    Behavior During Therapy  [facility] for tasks assessed/performed       Past Medical History:  Diagnosis Date  . Anxiety   . Arthritis    "knees, hands, ankles, feet" (05/12/2016)  . Asthma    Dr. Melvyn Novas  . Bipolar disorder ([place])   . Chronic back pain    "lower and middle" (05/12/2016)  . CKD (chronic kidney disease), stage III ([place])   . Colon polyps   . Depression   . Diverticulosis   . Epilepsy ([place])   . Essential hypertension   . Fibromyalgia   . Gallstones   . GERD (gastroesophageal reflux disease)   . H/O hiatal hernia   . Hyperlipidemia   . IBS (irritable bowel syndrome)   . Paroxysmal A-fib ([place])    failed medical therapy with tikosyn, s/p AF ablation x 2  . Peripheral neuropathy   . Sarcoidosis of lung ([place])    Dr. Melvyn Novas  . Seizures ([place])    "epileptic; pretty regular recently" (05/12/2016)  . Syncope   . Type II diabetes mellitus ([place])     Past Surgical History:  Procedure Laterality Date  . ABDOMINAL HYSTERECTOMY  1995  . ANTERIOR CERVICAL DECOMP/DISCECTOMY FUSION  07/11/2012   Procedure: ANTERIOR CERVICAL DECOMPRESSION/DISCECTOMY FUSION 1 LEVEL;  Surgeon: Ophelia Charter, MD;   Location: [facility] NEURO ORS;  Service: Neurosurgery;  Laterality: N/A;  Cervical Five-Six Anterior Cervical Decompression with Fusion Interbody Prothesis Plating and Bonegraft  . ATRIAL FIBRILLATION ABLATION N/A 05/18/2017   Procedure: Atrial Fibrillation Ablation;  Surgeon: Thompson Grayer, MD;  Location: [facility] CV LAB;  Service: Cardiovascular;  Laterality: N/A;  . ATRIAL FIBRILLATION ABLATION N/A 11/16/2017   Procedure: ATRIAL FIBRILLATION ABLATION;  Surgeon: Thompson Grayer, MD;  Location: [place] [facility] [facility] CV LAB;  Service: Cardiovascular;  Laterality: N/A;  . CARDIAC CATHETERIZATION  2012   a. Normal coronaries 2012. b. Normal nuc 09/2014.  Marland Kitchen CARDIAC CATHETERIZATION  05/18/2017  . CHOLECYSTECTOMY OPEN  1976  . CLOSED REDUCTION ANKLE FRACTURE Left 10/2006   "got steel rod in my leg; and screws"  . COLON SURGERY    . DILATION AND CURETTAGE OF UTERUS    . ESOPHAGEAL MANOMETRY  03/21/2012   Procedure: ESOPHAGEAL MANOMETRY (EM);  Surgeon: Milus Banister, MD;  Location: [facility] ENDOSCOPY;  Service: Endoscopy;  Laterality: N/A;  . FRACTURE SURGERY    . NEUROPLASTY / TRANSPOSITION MEDIAN NERVE AT CARPAL TUNNEL Left 2004  . NEUROPLASTY / TRANSPOSITION MEDIAN NERVE AT CARPAL TUNNEL Right 2002  . 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 SALPINGOOPHORECTOMY Bilateral 2000  . TEE WITHOUT CARDIOVERSION N/A  05/18/2017   Procedure: TRANSESOPHAGEAL ECHOCARDIOGRAM (TEE);  Surgeon: Lelon Perla, MD;  Location: [facility] ENDOSCOPY;  Service: Cardiovascular;  Laterality: N/A;  . [place]:   01/27/18 1027 01/27/18 1048  BP: 138/81 136/80  Pulse: 66 68    Subjective Assessment - 01/27/18 1021    Subjective  Pt reports she was sick on Monday and had to go to MD but feels better now. MD told pt her BP was low and they will monitor and might decr. BP meds, she has an appt. 02/02/18. Pt reported 2 near falls but caught herself.     Pertinent History  HTN, syncope, orthostatic hypotension, a-fib, asthma,  bipolar, anxiety, depression, DM, seizures, sarcoidosis of lungs, CKD, HLD, chronic back pain, arthritis, fibromyalgia, peripheral neuropathy (B UE/LE)    Patient Stated Goals  Try to get some strength in my body, improve balance, walk longer distances    Currently in Pain?  Yes    Pain Score  7     Pain Location  -- generalized    Pain Descriptors / Indicators  Aching    Pain Type  Neuropathic pain    Pain Onset  More than a month ago    Pain Frequency  Constant    Aggravating Factors   movement    Pain Relieving Factors  Lyrica and rest         [facility] PT Assessment - 01/27/18 1029      6 Minute Walk- Baseline   6 Minute Walk- Baseline  yes    BP (mmHg)  138/81    HR (bpm)  66    02 Sat (%RA)  95 %    Modified Borg Scale for Dyspnea  0- Nothing at all    Perceived Rate of Exertion (Borg)  13- Somewhat hard now that pt feels weak from Monday illness      6 Minute walk- Post Test   6 Minute Walk Post Test  yes    BP (mmHg)  -- unable to assess right after 2/2all cuff/stethoscopes in use    HR (bpm)  74    02 Sat (%RA)  96 %    Modified Borg Scale for Dyspnea  4- somewhat severe    Perceived Rate of Exertion (Borg)  16-      6 minute walk test results    Aerobic Endurance Distance Walked  381    Endurance additional comments  Two seated rest breaks during amb. Pt reported she felt lightheaded. Pt utilized SBQC during amb.  Pt required seated rest breaks with water after 6MWT.                  [facility] Adult PT Treatment/Exercise - 01/27/18 1041      Standardized Balance Assessment   Standardized Balance Assessment  Dynamic Gait Index      Dynamic Gait Index   Level Surface  Moderate Impairment 1 LOB but self corrected with stepping    Change in Gait Speed  Mild Impairment    Gait with Horizontal Head Turns  Mild Impairment amb. better with head turns vs. straight ahead    Gait with Vertical Head Turns  Mild Impairment    Gait and Pivot Turn  Moderate Impairment     Step Over Obstacle  Moderate Impairment    Step Around Obstacles  Mild Impairment    Steps  Moderate Impairment 2 rails to descend    Total Score  12  Self Care: PT Education - 01/27/18 1055    Education provided  Yes    Education Details  PT educated pt on goal progress, outcome measures and PT d/c 2/2 pt with intermittent bouts of illness and has reached maximal functional potential with PT at this time. PT encouraged pt to request referral back to PT if any changes occur (better or worsening of condition).     Person(s) Educated  Patient    Methods  Explanation    Comprehension  Verbalized understanding       PT Short Term Goals - 12/22/17 1632      PT SHORT TERM GOAL #1   Title  same as LTGs        PT Long Term Goals - 01/27/18 1057      PT LONG TERM GOAL #1   Title  Pt will be IND with HEP to improve balance, endurance, and strength. TARGET DATE FOR ALL LTGS: 01/19/18    Baseline  needs cues and intermittent performance    Status  Partially Met      PT LONG TERM GOAL #2   Title  Pt will improve TUG time with LRAD to </=13.5 sec. to decr. falls risk.     Baseline  17.12 first trial, 13.06 second trial-cues for safe turn.  (without AD)    Status  Achieved      PT LONG TERM GOAL #3   Title  Pt will improve gait speed to >/=2.58f/sec. to decr. falls risk and safely amb. in the community with [place]    Baseline  2.24 ft/sec on 01/17/18 (only demo'd improvement from baseline, did not meet goal)    Status  Partially Met      PT LONG TERM GOAL #4   Title  Pt will amb. 500' over even/paved surfaces at MOD I level with LRAD to improve functional mobility.     Baseline  S level on 01/17/18 with SBQC    Status  Partially Met      PT LONG TERM GOAL #5   Title  Perform DGI and write goal as indicated.     Status  Achieved      PT LONG TERM GOAL #6   Title  Pt will improve DGI score to >/=16/24 to decr. falls risk.     Baseline  12/24    Status  Partially Met       PT LONG TERM GOAL #7   Title  Pt will improve 6MWT distance to 448' with LRAD to improve endurance.     Baseline  381' with SBQC    Status  Partially Met            Plan - 01/27/18 1057    Clinical Impression Statement  Pt met LTG 2 and partially met all other goals. Pleas see pt d/c summary for details.     Rehab Potential  Fair    Clinical Impairments Affecting Rehab Potential  see above.     PT Frequency  2x / week    PT Duration  4 weeks    PT Treatment/Interventions  ADLs/Self Care Home Management;Biofeedback;Therapeutic activities;Therapeutic exercise;Manual techniques;Vestibular;Orthotic Fit/Training;Functional mobility training;Stair training;Gait training;DME Instruction;Neuromuscular re-education;Balance training;Patient/family education    Consulted and Agree with Plan of Care  Patient       Patient will benefit from skilled therapeutic intervention in order to improve the following deficits and impairments:  Abnormal gait, Decreased endurance, Decreased knowledge of use of DME, Decreased strength, Decreased balance, Decreased mobility,  Pain, Impaired flexibility, Postural dysfunction, Impaired sensation  Visit Diagnosis: Other abnormalities of gait and mobility  Muscle weakness (generalized)  Abnormal posture     Problem List Patient Active Problem List   Diagnosis Date Noted  . Nausea without vomiting 01/26/2018  . Cough due to ACE inhibitor 12/21/2017  . Gait abnormality 11/30/2017  . Cardiac syncope 11/22/2017  . Type II diabetes mellitus ([place])   . Bipolar disorder ([place])   . Atrial fibrillation ([place])   . Chronic back pain   . CKD (chronic kidney disease), stage III ([place])   . Depression   . Anxiety   . Paroxysmal A-fib ([place])   . Diarrhea 09/03/2017  . Estrogen deficiency 06/07/2017  . Healthcare maintenance 06/03/2017  . Paroxysmal atrial fibrillation ([place]) 05/18/2017  . A-fib ([place]) 05/05/2017  . Back pain 12/11/2016  . Pelvic pain  11/25/2016  . Chronic pain 09/08/2016  . Spells of decreased attentiveness 09/08/2016  . Persistent atrial fibrillation ([place])   . Irritable bowel syndrome 03/03/2016  . Chronic leukopenia 09/13/2015  . PAF (paroxysmal atrial fibrillation) ([place]) 08/22/2015  . Morbid obesity ([place]) 07/11/2015  . Bipolar I disorder ([place]) 05/22/2015  . Chronic kidney disease (CKD), stage III (moderate) ([place]) 05/14/2015  . Insulin dependent diabetes mellitus ([place])   . Seizure disorder ([place])   . History of diverticulitis   . Syncope 06/29/2012  . Orthostatic hypotension 06/29/2012  . History of colonic polyps 09/01/2011  . Sarcoid ([place]) 09/01/2011  . Asthma 09/01/2011  . GERD (gastroesophageal reflux disease) 09/01/2011  . 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 Diabetes mellitus type 2, uncontrolled, with complications ([place]) 62/86/3817  . Hyperlipidemia 11/19/2008  . Essential hypertension 11/19/2008    Vernal Hritz L 01/27/2018, 10:59 AM  [facility] [address] [place], [place], [address] Phone: [phone]   Fax:  [phone]  PHYSICAL THERAPY DISCHARGE SUMMARY  Visits from Start of Care: 8  Current functional level related to goals / functional outcomes: PT Long Term Goals - 01/27/18 1057      PT LONG TERM GOAL #1   Title  Pt will be IND with HEP to improve balance, endurance, and strength. TARGET DATE FOR ALL LTGS: 01/19/18    Baseline  needs cues and intermittent performance    Status  Partially Met      PT LONG TERM GOAL #2   Title  Pt will improve TUG time with LRAD to </=13.5 sec. to decr. falls risk.     Baseline  17.12 first trial, 13.06 second trial-cues for safe turn.  (without AD)    Status  Achieved      PT LONG TERM GOAL #3   Title  Pt will improve gait speed to >/=2.37f/sec. to decr. falls risk and safely amb. in the community with [place]    Baseline  2.24 ft/sec on 01/17/18 (only demo'd improvement from baseline, did  not meet goal)    Status  Partially Met      PT LONG TERM GOAL #4   Title  Pt will amb. 500' over even/paved surfaces at MOD I level with LRAD to improve functional mobility.     Baseline  S level on 01/17/18 with SBQC    Status  Partially Met      PT LONG TERM GOAL #5   Title  Perform DGI and write goal as indicated.     Status  Achieved      PT LONG TERM GOAL #6   Title  Pt will  improve DGI score to >/=16/24 to decr. falls risk.     Baseline  12/24    Status  Partially Met      PT LONG TERM GOAL #7   Title  Pt will improve 6MWT distance to 448' with LRAD to improve endurance.     Baseline  381' with SBQC    Status  Partially Met         Remaining deficits: Impaired balance and strength. Pt with intermittent attendance 2/2 health issues and reporting lightheadedness during amb. With MD following pt re: BP. PT discharging at this time, as pt has achieved maximal functional potential at this time and needs to f/u with MD regarding medical issues.    Education / Equipment: HEP  Plan: Patient agrees to discharge.  Patient goals were partially met. Patient is being discharged due to being pleased with the current functional level.  ?????       Geoffry Paradise, PT,DPT 01/27/18 11:00 AM Phone: [phone] Fax: [phone]   Name: [name] MRN: [id number] Date of Birth: 19-Oct-1950

## 2018-02-01 ENCOUNTER — Other Ambulatory Visit: Payer: Self-pay | Admitting: Internal Medicine

## 2018-02-01 DIAGNOSIS — I10 Essential (primary) hypertension: Secondary | ICD-10-CM

## 2018-02-02 ENCOUNTER — Encounter: Payer: Self-pay | Admitting: Internal Medicine

## 2018-02-02 ENCOUNTER — Ambulatory Visit (INDEPENDENT_AMBULATORY_CARE_PROVIDER_SITE_OTHER): Payer: Medicare Other | Admitting: Internal Medicine

## 2018-02-02 VITALS — BP 130/80 | HR 75 | Temp 98.1°F | Ht 67.0 in | Wt 215.2 lb

## 2018-02-02 DIAGNOSIS — J0110 Acute frontal sinusitis, unspecified: Secondary | ICD-10-CM | POA: Diagnosis not present

## 2018-02-02 MED ORDER — GUAIFENESIN 100 MG/5ML PO LIQD: 200.0000 mg | Freq: Three times a day (TID) | ORAL | 0 refills | Status: DC | PRN

## 2018-02-02 MED ORDER — AMOXICILLIN-POT CLAVULANATE 875-125 MG PO TABS: 1.0000 | ORAL_TABLET | Freq: Two times a day (BID) | ORAL | 0 refills | Status: DC

## 2018-02-02 NOTE — Progress Notes (Signed)
   [facility] Noralee CharsAsiyah Mikell, MD Phone: [phone]  Reason For Visit: SDA for Congestion/ Sinus Pain   # Sinus Pain/ Congestion  -Patient states that she has had nasal congestion and cough for the past 2 weeks.  She indicates her maxillary and ethmoid sinuses are sore.  On Sunday she notes having a bad asthma attack.  With wheezing and cough however she states she is doing better from that.  She is using her Dulera and albuterol nebulizer which works quite well for her.  She denies any worsening of shortness of breath or wheezing at this point.  She does indicate she still has cough and congestion as well as sinus pain.  She denies any fevers. Indicates  having yellow-green mucus from her nose.  Past Medical History Reviewed problem list.  Medications- reviewed and updated No additions to family history Social history- patient is a non smoker  Objective: BP 130/80 (BP Location: Left Arm, Patient Position: Sitting, Cuff Size: Normal)   Pulse 75   Temp 98.1 F (36.7 C) (Oral)   Ht 5\' 7"  (1.702 m)   Wt 215 lb 3.2 oz (97.6 kg)   SpO2 98%   BMI 33.71 kg/m  Gen: NAD, alert, cooperative with exam HEENT: Ethmoid, frontal and maxillary sinus tender     Neck: No masses palpated. No lymphadenopathy    Ears: Tympanic membranes intact, normal light reflex, no erythema, no bulging    Eyes: PERRLA, EOMI    Nose: nasal turbinates congested     Throat: moist mucus membranes, no erythema Cardio: regular rate and rhythm, S1S2 heard, no murmurs appreciated Pulm: clear to auscultation bilaterally, no wheezes, rhonchi or rales Skin: dry, intact, no rashes or lesions   Assessment/Plan: See problem based a/p  Acute frontal sinusitis - amoxicillin-clavulanate (AUGMENTIN) 875-125 MG tablet; Take 1 tablet by mouth 2 (two) times daily.  Dispense: 20 tablet; Refill: 0 - guaiFENesin (ROBITUSSIN MUCUS+CHEST CONGEST) 100 MG/5ML liquid; Take 10 mLs (200 mg total) by mouth 3 (three)  times daily as needed for cough.  Dispense: 120 mL; Refill: 0 - Follow up if no improvement

## 2018-02-02 NOTE — Patient Instructions (Signed)
I am going to give you the Robitussin for your sinus and the Augmentin which you will take twice daily for 10 days.

## 2018-02-02 NOTE — Assessment & Plan Note (Signed)
-   amoxicillin-clavulanate (AUGMENTIN) 875-125 MG tablet; Take 1 tablet by mouth 2 (two) times daily.  Dispense: 20 tablet; Refill: 0 - guaiFENesin (ROBITUSSIN MUCUS+CHEST CONGEST) 100 MG/5ML liquid; Take 10 mLs (200 mg total) by mouth 3 (three) times daily as needed for cough.  Dispense: 120 mL; Refill: 0 - Follow up if no improvement

## 2018-02-10 ENCOUNTER — Telehealth: Payer: Self-pay | Admitting: Internal Medicine

## 2018-02-10 NOTE — Telephone Encounter (Signed)
Will complete tomorrow morning. Please call patient tomorrow afternoon for pick up. Thanks Rudolf Blizard

## 2018-02-10 NOTE — Telephone Encounter (Signed)
Pt's aunt came in office and dropped a Renewal of Disability Parking Placard form, requesting to be filled and signed by MD. Last DOS 02-02-2018. Best phone # to contact is [phone]. Form was placed in [employer] folder.

## 2018-02-10 NOTE — Telephone Encounter (Signed)
Placed in MDs box. Deseree Blount, CMA  

## 2018-02-11 ENCOUNTER — Encounter: Payer: Self-pay | Admitting: Internal Medicine

## 2018-02-14 ENCOUNTER — Encounter: Payer: Self-pay | Admitting: Student in an Organized Health Care Education/Training Program

## 2018-02-14 ENCOUNTER — Ambulatory Visit (INDEPENDENT_AMBULATORY_CARE_PROVIDER_SITE_OTHER): Payer: Medicare Other | Admitting: Student in an Organized Health Care Education/Training Program

## 2018-02-14 VITALS — BP 130/80 | HR 75 | Temp 97.8°F | Wt 221.0 lb

## 2018-02-14 DIAGNOSIS — G8929 Other chronic pain: Secondary | ICD-10-CM

## 2018-02-14 DIAGNOSIS — Z0001 Encounter for general adult medical examination with abnormal findings: Secondary | ICD-10-CM | POA: Diagnosis not present

## 2018-02-14 DIAGNOSIS — K219 Gastro-esophageal reflux disease without esophagitis: Secondary | ICD-10-CM | POA: Diagnosis not present

## 2018-02-14 DIAGNOSIS — E1165 Type 2 diabetes mellitus with hyperglycemia: Secondary | ICD-10-CM

## 2018-02-14 DIAGNOSIS — I480 Paroxysmal atrial fibrillation: Secondary | ICD-10-CM | POA: Diagnosis not present

## 2018-02-14 DIAGNOSIS — I951 Orthostatic hypotension: Secondary | ICD-10-CM | POA: Diagnosis not present

## 2018-02-14 DIAGNOSIS — J4550 Severe persistent asthma, uncomplicated: Secondary | ICD-10-CM

## 2018-02-14 DIAGNOSIS — E7849 Other hyperlipidemia: Secondary | ICD-10-CM

## 2018-02-14 DIAGNOSIS — E118 Type 2 diabetes mellitus with unspecified complications: Secondary | ICD-10-CM

## 2018-02-14 DIAGNOSIS — R55 Syncope and collapse: Secondary | ICD-10-CM | POA: Diagnosis not present

## 2018-02-14 DIAGNOSIS — IMO0002 Reserved for concepts with insufficient information to code with codable children: Secondary | ICD-10-CM

## 2018-02-14 DIAGNOSIS — Z86010 Personal history of colonic polyps: Secondary | ICD-10-CM | POA: Diagnosis not present

## 2018-02-14 DIAGNOSIS — I10 Essential (primary) hypertension: Secondary | ICD-10-CM

## 2018-02-14 DIAGNOSIS — N183 Chronic kidney disease, stage 3 unspecified: Secondary | ICD-10-CM

## 2018-02-14 DIAGNOSIS — K580 Irritable bowel syndrome with diarrhea: Secondary | ICD-10-CM

## 2018-02-14 DIAGNOSIS — M5441 Lumbago with sciatica, right side: Secondary | ICD-10-CM

## 2018-02-14 DIAGNOSIS — D869 Sarcoidosis, unspecified: Secondary | ICD-10-CM

## 2018-02-14 DIAGNOSIS — Z0000 Encounter for general adult medical examination without abnormal findings: Secondary | ICD-10-CM

## 2018-02-14 LAB — POCT GLYCOSYLATED HEMOGLOBIN (HGB A1C): HBA1C, POC (CONTROLLED DIABETIC RANGE): 8.9 % — AB (ref 0.0–7.0)

## 2018-02-14 NOTE — Patient Instructions (Addendum)
[name], Please follow up with your GI doctor to get a colonoscopy.  Continue to work on your A1c levels by monitoring your diet and exercise. Please return in 3 months for a follow up visit or sooner if you have any concerns. Thank you for allowing me to car for you. Doristine Mango DO   Diabetic Nephropathy  Diabetic nephropathy is kidney disease that is caused by diabetes (diabetes mellitus). Kidneys are organs that filter and clean blood and get rid of body waste products and extra fluid. Diabetes can cause gradual kidney damage over many years. Diabetic nephropathy that continues to get worse (progress) can lead to kidney failure. What are the causes? This condition is caused by kidney damage from diabetes that is not well controlled with treatment. Having high blood sugar (glucose) for a long time can damage blood vessels in the kidneys and cause them to thicken and scar. This prevents the kidneys from functioning normally. What increases the risk? This condition is more likely to develop in people with diabetes who:  Have had diabetes for many years.  Have high blood pressure.  Have high blood glucose levels over a long period of time.  Have a family history of kidney disease.  Have a history of tobacco use.  Have certain genes that are passed from parent to child (inherited).  Are of African-American, Hispanic, Native American, Asian, or [place] descent.  What are the signs or symptoms? This condition may not cause symptoms at first. If you do have symptoms, they may include:  Swelling of your hands, feet, or ankles.  Weakness.  Poor appetite.  Nausea.  Confusion.  Fatigue.  Trouble sleeping.  Dry, itchy skin.  If nephropathy leads to kidney failure, symptoms may include:  Vomiting.  Shortness of breath.  Jerky movements you cannot control (seizure).  Coma.  How is this diagnosed? It is important to diagnose this condition  before symptoms develop. You may be screened for diabetic nephropathy at a routine health care visit. Screening tests may include:  Yearly (annual) urine tests.  Urine collection over a 24-hour period to measure kidney function.  Blood tests that measure blood glucose levels and kidney function.  If your health care provider suspects diabetic nephropathy, he or she may:  Review your medical history and symptoms.  Do a physical exam.  Do an ultrasound of your kidneys.  Perform a procedure to take a sample of kidney tissue for testing (biopsy).  How is this treated? The goal of treatment is to prevent or slow down damage to your kidneys by managing your diabetes. To do this, it is important to control:  Your blood pressure. ? Generally, the goal is to keep your blood pressure below 130/80. Your target blood pressure depends on many factors. ? To help control blood pressure, you may be prescribed medicines to lower blood pressure (ACE inhibitors) or to help your body get rid of excess fluid (diuretics).  Your A1c (hemoglobin A1c) level. Generally, the goal of treatment is to maintain an A1c level of less than 7%.  Your blood glucose level.  Your blood lipids. If you have high cholesterol, you may need to take lipid-lowering drugs, such as statins.  Other treatments may include:  Medicines, including insulin injections.  Lifestyle changes, such as: ? Losing weight. ? Quitting smoking (smoking cessation).  Changes to your diet, which may include: ? Limiting your salt (sodium), protein, and fluid intake. ? Taking vitamin D supplements.  If your disease progresses to end-stage kidney failure, treatment may include:  Dialysis. This is a procedure to filter your blood with a machine.  Kidney transplant.  Follow these instructions at home: Lifestyle  Maintain a healthy weight. Work with your health care provider to lose weight, if needed.  Do not use any products that  contain nicotine or tobacco, such as cigarettes and e-cigarettes. If you need help quitting, ask your health care provider.  Be physically active every day. Ask your health care provider what type of exercise is best for you.  Eat healthy foods, and eat healthy snacks between meals. Follow instructions from your health care provider about eating and drinking restrictions. ? Limit your sodium, protein, or fluid intake as directed.  Work with your health care provider to manage your blood pressure. General instructions  Follow your diabetes management plan as directed. ? Check your blood glucose levels as directed by your health care provider. ? Keep your blood glucose in your target range as directed by your health care provider. ? Have your A1c level checked at least two times a year, or as often as told by your health care provider.  Take over-the-counter and prescription medicines only as told by your health care provider. This includes insulin and supplements.  Keep all follow-up visits and routine visits as told by your health care provider. This is important. Make sure to get screening tests as directed. Contact a health care provider if:  You have trouble keeping your blood glucose in your goal range.  Your blood glucose level is higher than 240 mg/dL (13.3 mmol/L) for 2 days in a row.  You have swelling in your hands, ankles, or feet.  You feel weak, tired, or dizzy.  You have involuntary muscle tightening (spasms).  You have nausea or vomiting.  You feel tired all the time. Get help right away if:  You are very sleepy.  You have: ? A seizure. ? Severe, painful muscle spasms. ? Shortness of breath. ? Chest pain.  You faint. Summary  Keep your blood glucose in your target range as directed by your health care provider.  Work with your health care provider to manage your blood pressure.  Keep all follow-up visits and routine visits as told by your health care  provider. This is important. Make sure to get screening tests as directed. This information is not intended to replace advice given to you by your health care provider. Make sure you discuss any questions you have with your health care provider. Document Released: 08/23/2007 Document Revised: 07/01/2016 Document Reviewed: 07/01/2016 Elsevier Interactive Patient Education  [employer]. Colonoscopy, Adult A colonoscopy is an exam to look at the large intestine. It is done to check for problems, such as:  Lumps (tumors).  Growths (polyps).  Swelling (inflammation).  Bleeding.  What happens before the procedure? Eating and drinking Follow instructions from your doctor about eating and drinking. These instructions may include:  A few days before the procedure - follow a low-fiber diet. ? Avoid nuts. ? Avoid seeds. ? Avoid dried fruit. ? Avoid raw fruits. ? Avoid vegetables.  1-3 days before the procedure - follow a clear liquid diet. Avoid liquids that have red or purple dye. Drink only clear liquids, such as: ? Clear broth or bouillon. ? Black coffee or tea. ? Clear juice. ? Clear soft drinks or sports drinks. ? Gelatin dessert. ? Popsicles.  On the day of the procedure - do not eat or drink  anything during the 2 hours before the procedure.  Bowel prep If you were prescribed an oral bowel prep:  Take it as told by your doctor. Starting the day before your procedure, you will need to drink a lot of liquid. The liquid will cause you to poop (have bowel movements) until your poop is almost clear or light green.  If your skin or butt gets irritated from diarrhea, you may: ? Wipe the area with wipes that have medicine in them, such as adult wet wipes with aloe and vitamin E. ? Put something on your skin that soothes the area, such as petroleum jelly.  If you throw up (vomit) while drinking the bowel prep, take a break for up to 60 minutes. Then begin the bowel prep again.  If you keep throwing up and you cannot take the bowel prep without throwing up, call your doctor.  General instructions  Ask your doctor about changing or stopping your normal medicines. This is important if you take diabetes medicines or blood thinners.  Plan to have someone take you home from the hospital or clinic. What happens during the procedure?  An IV tube may be put into one of your veins.  You will be given medicine to help you relax (sedative).  To reduce your risk of infection: ? Your doctors will wash their hands. ? Your anal area will be washed with soap.  You will be asked to lie on your side with your knees bent.  Your doctor will get a long, thin, flexible tube ready. The tube will have a camera and a light on the end.  The tube will be put into your anus.  The tube will be gently put into your large intestine.  Air will be delivered into your large intestine to keep it open. You may feel some pressure or cramping.  The camera will be used to take photos.  A small tissue sample may be removed from your body to be looked at under a microscope (biopsy). If any possible problems are found, the tissue will be sent to a lab for testing.  If small growths are found, your doctor may remove them and have them checked for cancer.  The tube that was put into your anus will be slowly removed. The procedure may vary among doctors and hospitals. What happens after the procedure?  Your doctor will check on you often until the medicines you were given have worn off.  Do not drive for 24 hours after the procedure.  You may have a small amount of blood in your poop.  You may pass gas.  You may have mild cramps or bloating in your belly (abdomen).  It is up to you to get the results of your procedure. Ask your doctor, or the department performing the procedure, when your results will be ready. This information is not intended to replace advice given to you by your  health care provider. Make sure you discuss any questions you have with your health care provider. Document Released: 09/05/2010 Document Revised: 06/03/2016 Document Reviewed: 10/15/2015 Elsevier Interactive Patient Education  2017 [place] Maintenance, Female Adopting a healthy lifestyle and getting preventive care can go a long way to promote health and wellness. Talk with your health care provider about what schedule of regular examinations is right for you. This is a good chance for you to check in with your provider about disease prevention and staying healthy. In between checkups, there are plenty of  things you can do on your own. Experts have done a lot of research about which lifestyle changes and preventive measures are most likely to keep you healthy. Ask your health care provider for more information. Weight and diet Eat a healthy diet  Be sure to include plenty of vegetables, fruits, low-fat dairy products, and lean protein.  Do not eat a lot of foods high in solid fats, added sugars, or salt.  Get regular exercise. This is one of the most important things you can do for your health. ? Most adults should exercise for at least 150 minutes each week. The exercise should increase your heart rate and make you sweat (moderate-intensity exercise). ? Most adults should also do strengthening exercises at least twice a week. This is in addition to the moderate-intensity exercise.  Maintain a healthy weight  Body mass index (BMI) is a measurement that can be used to identify possible weight problems. It estimates body fat based on height and weight. Your health care provider can help determine your BMI and help you achieve or maintain a healthy weight.  For females 61 years of age and older: ? A BMI below 18.5 is considered underweight. ? A BMI of 18.5 to 24.9 is normal. ? A BMI of 25 to 29.9 is considered overweight. ? A BMI of 30 and above is considered obese.  Watch  levels of cholesterol and blood lipids  You should start having your blood tested for lipids and cholesterol at 66 years of age, then have this test every 5 years.  You may need to have your cholesterol levels checked more often if: ? Your lipid or cholesterol levels are high. ? You are older than 66 years of age. ? You are at high risk for heart disease.  Cancer screening Lung Cancer  Lung cancer screening is recommended for adults 35-24 years old who are at high risk for lung cancer because of a history of smoking.  A yearly low-dose CT scan of the lungs is recommended for people who: ? Currently smoke. ? Have quit within the past 15 years. ? Have at least a 30-pack-year history of smoking. A pack year is smoking an average of one pack of cigarettes a day for 1 year.  Yearly screening should continue until it has been 15 years since you quit.  Yearly screening should stop if you develop a health problem that would prevent you from having lung cancer treatment.  Breast Cancer  Practice breast self-awareness. This means understanding how your breasts normally appear and feel.  It also means doing regular breast self-exams. Let your health care provider know about any changes, no matter how small.  If you are in your 20s or 30s, you should have a clinical breast exam (CBE) by a health care provider every 1-3 years as part of a regular health exam.  If you are 36 or older, have a CBE every year. Also consider having a breast X-ray (mammogram) every year.  If you have a family history of breast cancer, talk to your health care provider about genetic screening.  If you are at high risk for breast cancer, talk to your health care provider about having an MRI and a mammogram every year.  Breast cancer gene (BRCA) assessment is recommended for women who have family members with BRCA-related cancers. BRCA-related cancers include: ? Breast. ? Ovarian. ? Tubal. ? Peritoneal  cancers.  Results of the assessment will determine the need for genetic counseling and BRCA1 and  BRCA2 testing.  Cervical Cancer Your health care provider may recommend that you be screened regularly for cancer of the pelvic organs (ovaries, uterus, and vagina). This screening involves a pelvic examination, including checking for microscopic changes to the surface of your cervix (Pap test). You may be encouraged to have this screening done every 3 years, beginning at age 58.  For women ages 23-65, health care providers may recommend pelvic exams and Pap testing every 3 years, or they may recommend the Pap and pelvic exam, combined with testing for human papilloma virus (HPV), every 5 years. Some types of HPV increase your risk of cervical cancer. Testing for HPV may also be done on women of any age with unclear Pap test results.  Other health care providers may not recommend any screening for nonpregnant women who are considered low risk for pelvic cancer and who do not have symptoms. Ask your health care provider if a screening pelvic exam is right for you.  If you have had past treatment for cervical cancer or a condition that could lead to cancer, you need Pap tests and screening for cancer for at least 20 years after your treatment. If Pap tests have been discontinued, your risk factors (such as having a new sexual partner) need to be reassessed to determine if screening should resume. Some women have medical problems that increase the chance of getting cervical cancer. In these cases, your health care provider may recommend more frequent screening and Pap tests.  Colorectal Cancer  This type of cancer can be detected and often prevented.  Routine colorectal cancer screening usually begins at 66 years of age and continues through 66 years of age.  Your health care provider may recommend screening at an earlier age if you have risk factors for colon cancer.  Your health care provider may also  recommend using home test kits to check for hidden blood in the stool.  A small camera at the end of a tube can be used to examine your colon directly (sigmoidoscopy or colonoscopy). This is done to check for the earliest forms of colorectal cancer.  Routine screening usually begins at age 76.  Direct examination of the colon should be repeated every 5-10 years through 66 years of age. However, you may need to be screened more often if early forms of precancerous polyps or small growths are found.  Skin Cancer  Check your skin from head to toe regularly.  Tell your health care provider about any new moles or changes in moles, especially if there is a change in a mole's shape or color.  Also tell your health care provider if you have a mole that is larger than the size of a pencil eraser.  Always use sunscreen. Apply sunscreen liberally and repeatedly throughout the day.  Protect yourself by wearing long sleeves, pants, a wide-brimmed hat, and sunglasses whenever you are outside.  Heart disease, diabetes, and high blood pressure  High blood pressure causes heart disease and increases the risk of stroke. High blood pressure is more likely to develop in: ? People who have blood pressure in the high end of the normal range (130-139/85-89 mm Hg). ? People who are overweight or obese. ? People who are African American.  If you are 32-4 years of age, have your blood pressure checked every 3-5 years. If you are 30 years of age or older, have your blood pressure checked every year. You should have your blood pressure measured twice-once when you are at  a hospital or clinic, and once when you are not at a hospital or clinic. Record the average of the two measurements. To check your blood pressure when you are not at a hospital or clinic, you can use: ? An automated blood pressure machine at a pharmacy. ? A home blood pressure monitor.  If you are between 42 years and 65 years old, ask your  health care provider if you should take aspirin to prevent strokes.  Have regular diabetes screenings. This involves taking a blood sample to check your fasting blood sugar level. ? If you are at a normal weight and have a low risk for diabetes, have this test once every three years after 66 years of age. ? If you are overweight and have a high risk for diabetes, consider being tested at a younger age or more often. Preventing infection Hepatitis B  If you have a higher risk for hepatitis B, you should be screened for this virus. You are considered at high risk for hepatitis B if: ? You were born in a country where hepatitis B is common. Ask your health care provider which countries are considered high risk. ? Your parents were born in a high-risk country, and you have not been immunized against hepatitis B (hepatitis B vaccine). ? You have HIV or AIDS. ? You use needles to inject street drugs. ? You live with someone who has hepatitis B. ? You have had sex with someone who has hepatitis B. ? You get hemodialysis treatment. ? You take certain medicines for conditions, including cancer, organ transplantation, and autoimmune conditions.  Hepatitis C  Blood testing is recommended for: ? Everyone born from [birth year] through 1964. ? Anyone with known risk factors for hepatitis C.  Sexually transmitted infections (STIs)  You should be screened for sexually transmitted infections (STIs) including gonorrhea and chlamydia if: ? You are sexually active and are younger than 66 years of age. ? You are older than 66 years of age and your health care provider tells you that you are at risk for this type of infection. ? Your sexual activity has changed since you were last screened and you are at an increased risk for chlamydia or gonorrhea. Ask your health care provider if you are at risk.  If you do not have HIV, but are at risk, it may be recommended that you take a prescription medicine daily to  prevent HIV infection. This is called pre-exposure prophylaxis (PrEP). You are considered at risk if: ? You are sexually active and do not regularly use condoms or know the HIV status of your partner(s). ? You take drugs by injection. ? You are sexually active with a partner who has HIV.  Talk with your health care provider about whether you are at high risk of being infected with HIV. If you choose to begin PrEP, you should first be tested for HIV. You should then be tested every 3 months for as long as you are taking PrEP. Pregnancy  If you are premenopausal and you may become pregnant, ask your health care provider about preconception counseling.  If you may become pregnant, take 400 to 800 micrograms (mcg) of folic acid every day.  If you want to prevent pregnancy, talk to your health care provider about birth control (contraception). Osteoporosis and menopause  Osteoporosis is a disease in which the bones lose minerals and strength with aging. This can result in serious bone fractures. Your risk for osteoporosis can be identified using a  bone density scan.  If you are 37 years of age or older, or if you are at risk for osteoporosis and fractures, ask your health care provider if you should be screened.  Ask your health care provider whether you should take a calcium or vitamin D supplement to lower your risk for osteoporosis.  Menopause may have certain physical symptoms and risks.  Hormone replacement therapy may reduce some of these symptoms and risks. Talk to your health care provider about whether hormone replacement therapy is right for you. Follow these instructions at home:  Schedule regular health, dental, and eye exams.  Stay current with your immunizations.  Do not use any tobacco products including cigarettes, chewing tobacco, or electronic cigarettes.  If you are pregnant, do not drink alcohol.  If you are breastfeeding, limit how much and how often you drink  alcohol.  Limit alcohol intake to no more than 1 drink per day for nonpregnant women. One drink equals 12 ounces of beer, 5 ounces of wine, or 1 ounces of hard liquor.  Do not use street drugs.  Do not share needles.  Ask your health care provider for help if you need support or information about quitting drugs.  Tell your health care provider if you often feel depressed.  Tell your health care provider if you have ever been abused or do not feel safe at home. This information is not intended to replace advice given to you by your health care provider. Make sure you discuss any questions you have with your health care provider. Document Released: 02/16/2011 Document Revised: 01/09/2016 Document Reviewed: 05/07/2015 Elsevier Interactive Patient Education  2018 [employer].   Colonoscopy, Adult A colonoscopy is an exam to look at the large intestine. It is done to check for problems, such as:  Lumps (tumors).  Growths (polyps).  Swelling (inflammation).  Bleeding.  What happens before the procedure? Eating and drinking Follow instructions from your doctor about eating and drinking. These instructions may include:  A few days before the procedure - follow a low-fiber diet. ? Avoid nuts. ? Avoid seeds. ? Avoid dried fruit. ? Avoid raw fruits. ? Avoid vegetables.  1-3 days before the procedure - follow a clear liquid diet. Avoid liquids that have red or purple dye. Drink only clear liquids, such as: ? Clear broth or bouillon. ? Black coffee or tea. ? Clear juice. ? Clear soft drinks or sports drinks. ? Gelatin dessert. ? Popsicles.  On the day of the procedure - do not eat or drink anything during the 2 hours before the procedure.  Bowel prep If you were prescribed an oral bowel prep:  Take it as told by your doctor. Starting the day before your procedure, you will need to drink a lot of liquid. The liquid will cause you to poop (have bowel movements) until your  poop is almost clear or light green.  If your skin or butt gets irritated from diarrhea, you may: ? Wipe the area with wipes that have medicine in them, such as adult wet wipes with aloe and vitamin E. ? Put something on your skin that soothes the area, such as petroleum jelly.  If you throw up (vomit) while drinking the bowel prep, take a break for up to 60 minutes. Then begin the bowel prep again. If you keep throwing up and you cannot take the bowel prep without throwing up, call your doctor.  General instructions  Ask your doctor about changing or stopping your normal medicines.  This is important if you take diabetes medicines or blood thinners.  Plan to have someone take you home from the hospital or clinic. What happens during the procedure?  An IV tube may be put into one of your veins.  You will be given medicine to help you relax (sedative).  To reduce your risk of infection: ? Your doctors will wash their hands. ? Your anal area will be washed with soap.  You will be asked to lie on your side with your knees bent.  Your doctor will get a long, thin, flexible tube ready. The tube will have a camera and a light on the end.  The tube will be put into your anus.  The tube will be gently put into your large intestine.  Air will be delivered into your large intestine to keep it open. You may feel some pressure or cramping.  The camera will be used to take photos.  A small tissue sample may be removed from your body to be looked at under a microscope (biopsy). If any possible problems are found, the tissue will be sent to a lab for testing.  If small growths are found, your doctor may remove them and have them checked for cancer.  The tube that was put into your anus will be slowly removed. The procedure may vary among doctors and hospitals. What happens after the procedure?  Your doctor will check on you often until the medicines you were given have worn off.  Do not  drive for 24 hours after the procedure.  You may have a small amount of blood in your poop.  You may pass gas.  You may have mild cramps or bloating in your belly (abdomen).  It is up to you to get the results of your procedure. Ask your doctor, or the department performing the procedure, when your results will be ready. This information is not intended to replace advice given to you by your health care provider. Make sure you discuss any questions you have with your health care provider. Document Released: 09/05/2010 Document Revised: 06/03/2016 Document Reviewed: 10/15/2015 Elsevier Interactive Patient Education  2017 [employer].

## 2018-02-14 NOTE — Telephone Encounter (Signed)
Patient aware form at front desk for pick up. Copy made for batch scanning. Ples SpecterAlisa Tyauna Lacaze, RN [facility] RN)

## 2018-02-14 NOTE — Assessment & Plan Note (Signed)
Stable. Continue current medications and monitoring pressures at home.

## 2018-02-14 NOTE — Progress Notes (Signed)
A1c

## 2018-02-14 NOTE — Progress Notes (Signed)
Subjective: Chief Complaint  Patient presents with  . Annual Exam     HPI: [name] is a 67 y.o. presenting to clinic today to discuss the following:  1.Annual physical exam  Saw patient for annual physical no new complaints or concerns. She reports continued neuropathic pain in her legs, feet, and upper extremities. We discussed the status of all her chronic conditions which are stable. Patient is following up appropriately with specialists. We also discussed her health maintenance measures that were due at time of appointment. We completed a diabetic foot exam, PHQ-9 and HgbA1c test in clinic today. For her other preventative measures, we discussed a referral to her GI specialist for colonoscopy, and discussed her weight loss with diet and exercise. Patient supplied documentation to show eye exam is up to date. All other cancer screening, immunizations, and exams were UTD.      ROS noted in HPI.   Past Medical, Surgical, Social, and Family History Reviewed & Updated per EMR.   Pertinent Historical Findings include:   Social History   Tobacco Use  Smoking Status Never Smoker  Smokeless Tobacco Never Used      Objective: BP 130/80 (BP Location: Left Arm, Patient Position: Sitting, Cuff Size: Normal)   Pulse 75   Temp 97.8 F (36.6 C) (Oral)   Wt 100.2 kg (221 lb)   SpO2 98%   BMI 34.61 kg/m  Vitals and nursing notes reviewed  Physical Exam  Constitutional: She is oriented to person, place, and time and well-developed, well-nourished, and in no distress.  HENT:  Head: Normocephalic.  Eyes: Pupils are equal, round, and reactive to light.  Cardiovascular: Normal rate, regular rhythm, normal heart sounds and intact distal pulses.  Pulses:      Dorsalis pedis pulses are 1+ on the right side, and 1+ on the left side.  Pulmonary/Chest: Effort normal and breath sounds normal.  Musculoskeletal:       Right foot: There is decreased range of motion. There is no  deformity.       Left foot: There is decreased range of motion. There is no deformity.  Feet:  Right Foot:  Protective Sensation: 5 sites tested. 4 sites sensed.  Skin Integrity: Positive for callus and dry skin. Negative for ulcer, blister, skin breakdown or erythema.  Left Foot:  Protective Sensation: 5 sites tested. 4 sites sensed.  Skin Integrity: Positive for callus and dry skin. Negative for ulcer, blister, skin breakdown or erythema.  Neurological: She is alert and oriented to person, place, and time.  Skin: Skin is warm and dry.  Psychiatric: Mood, memory, affect and judgment normal.   Diabetic Foot exam: Feet were without ulcers or sores. Moderate callus formation bilaterally. Vibration test and fiber test showed decreased sensation bilaterally.    Results for orders placed or performed in visit on 02/14/18 (from the past 72 hour(s))  HgB A1c     Status: Abnormal   Collection Time: 02/14/18 11:24 AM  Result Value Ref Range   Hemoglobin A1C  4.0 - 5.6 %   HbA1c POC (<> result, manual entry)  4.0 - 5.6 %   HbA1c, POC (prediabetic range)  5.7 - 6.4 %   HbA1c, POC (controlled diabetic range) 8.9 (A) 0.0 - 7.0 %    Assessment/Plan: Wellness examination Patient has stable chronic conditions that are well managed on current therapy. Health maintenance measures completed in office were PQH-9, diabetic foot exam, obtained documentation from recent eye exam, reviewed immunizations  and other screenings that are UTD. Reviewed patient medication list. To be completed outside office, referred patient to GI for colonoscopy.  T2DM Improving. Patients HgbA1c was 8.9 which is improved from last visit. She will continue to follow up with cardiology and take current medications. We discussed stricter control of glucose with diet and exercise.  Essential hypertension Stable. Continue current medications and monitoring pressures at home.  Afib Stable. Patient states she is asymptomatic since  last ablation therapy. She continues to follow with cardiology.   Asthma Stable. Patient will continue to use her medications and to follow up with pulmonary clinic. She is aware of emergency plan if her medications do not relieve her symptoms.  GERD Condition is stable. Well controlled with current therapy. Follow up in 6 months or return sooner if worsens.  IBS Condition is stable. Well controlled with current therapy. Follow up in 6 months or return sooner if worsens.  PATIENT EDUCATION PROVIDED: See AVS    Diagnosis and plan along with any newly prescribed medication(s) were discussed in detail with this patient today. The patient verbalized understanding and agreed with the plan. Patient advised if symptoms worsen return to clinic or ER.    Orders Placed This Encounter  Procedures  . Lipid panel  . Hemoglobin A1c  . Ambulatory referral to Gastroenterology    Referral Priority:   Routine    Referral Type:   Consultation    Referral Reason:   Specialty Services Required    Number of Visits Requested:   1  . HgB A1c  . Endoscopy, sigmoid    Standing Status:   Future    Standing Expiration Date:   02/15/2019    Order Specific Question:   Where should this test be performed?    Answer:   [facility]    Order Specific Question:   Pre-op diagnosis    Answer:   previous history of colonic polyps     Jamelle Rushing, DO 02/14/2018, 11:56 AM PGY-1, [facility]

## 2018-02-21 ENCOUNTER — Ambulatory Visit (INDEPENDENT_AMBULATORY_CARE_PROVIDER_SITE_OTHER): Payer: Medicare Other | Admitting: Internal Medicine

## 2018-02-21 ENCOUNTER — Encounter: Payer: Self-pay | Admitting: Internal Medicine

## 2018-02-21 VITALS — BP 124/64 | HR 76 | Ht 67.0 in | Wt 217.0 lb

## 2018-02-21 DIAGNOSIS — I480 Paroxysmal atrial fibrillation: Secondary | ICD-10-CM | POA: Diagnosis not present

## 2018-02-21 DIAGNOSIS — I10 Essential (primary) hypertension: Secondary | ICD-10-CM | POA: Diagnosis not present

## 2018-02-21 DIAGNOSIS — R55 Syncope and collapse: Secondary | ICD-10-CM | POA: Diagnosis not present

## 2018-02-21 DIAGNOSIS — I495 Sick sinus syndrome: Secondary | ICD-10-CM

## 2018-02-21 MED ORDER — AMIODARONE HCL 100 MG PO TABS: 100.0000 mg | ORAL_TABLET | Freq: Every day | ORAL | 3 refills | Status: DC

## 2018-02-21 NOTE — Patient Instructions (Addendum)
Medication Instructions:  Your physician has recommended you make the following change in your medication:  1.  Decrease your amiodarone 200 mg- Take 1/2 tablet by mouth daily.    When you refill your amiodarone it will be 100 mg tablets-then you will take one tablet daily.  Labwork: None ordered.  Testing/Procedures: None ordered.  Follow-Up: Your physician wants you to follow-up in: 3 months with Dr. Johney FrameAllred.      Any Other Special Instructions Will Be Listed Below (If Applicable).  If you need a refill on your cardiac medications before your next appointment, please call your pharmacy.

## 2018-02-21 NOTE — Progress Notes (Signed)
PCP: Leeroy Bock, DO Primary Cardiologist: Dr Cato Mulligan [name] is a 67 y.o. female who presents today for routine electrophysiology followup.  Since [name], [name].  [name] and is pleased with Toni results of Toni procedure.  Today, Toni denies symptoms of palpitations, chest pain, shortness of breath,  lower extremity edema, dizziness, presyncope, or syncope.  Toni patient is otherwise without complaint today.   Past Medical History:  Diagnosis Date  . Anxiety   . Arthritis    "knees, hands, ankles, feet" (05/12/2016)  . Asthma    Dr. [name]  . Atrial fibrillation (HCC)   . Bipolar disorder (HCC)   . Chronic back pain    "lower and middle" (05/12/2016)  . Chronic kidney disease (CKD), stage III (moderate) (HCC) 05/14/2015  . CKD (chronic kidney disease), stage III (HCC)   . Colon polyps   . Depression   . Diverticulosis   . Epilepsy (HCC)   . Essential hypertension   . Fibromyalgia   . Gallstones   . GERD (gastroesophageal reflux disease)   . H/O hiatal hernia   . Hyperlipidemia   . IBS (irritable bowel syndrome)   . Insulin dependent diabetes mellitus (HCC)   . Paroxysmal A-fib (HCC)    failed medical therapy with tikosyn, s/p AF [name] x 2  . Paroxysmal A-fib (HCC)    a. On [name]. b. Recurrence 08/2014 in setting of GI illness.  . Paroxysmal atrial fibrillation (HCC) 05/18/2017  . Peripheral neuropathy   . Persistent atrial fibrillation (HCC)   . Sarcoidosis of lung (HCC)    Dr. [name]  . Seizures (HCC)    "epileptic; pretty regular recently" (05/12/2016)  . Syncope   . Type II diabetes mellitus (HCC)   . Type II diabetes mellitus (HCC)    Past Surgical History:  Procedure Laterality Date  . ABDOMINAL HYSTERECTOMY  1995  . ANTERIOR CERVICAL DECOMP/DISCECTOMY FUSION  07/11/2012   Procedure: ANTERIOR CERVICAL DECOMPRESSION/DISCECTOMY FUSION 1 LEVEL;  Surgeon: [name], MD;  Location: [facility];  Service: Neurosurgery;  Laterality: N/A;  Cervical Five-Six Anterior Cervical Decompression with Fusion Interbody Prothesis Plating and Bonegraft  . ATRIAL FIBRILLATION [name] N/A 05/18/2017   Procedure: Atrial Fibrillation [name];  Surgeon: [name], MD;  Location: [facility] IN[facility] LAB;  Service: Cardiovascular;  Laterality: N/A;  . ATRIAL FIBRILLATION [name] N/A 11/16/2017   Procedure: ATRIAL FIBRILLATION [name];  Surgeon: [name], MD;  Location: [facility];  Service: Cardiovascular;  Laterality: N/A;  . CARDIAC CATHETERIZATION  2012   a. Normal coronaries 2012. b. Normal nuc 09/2014.  Marland Kitchen CARDIAC CATHETERIZATION  05/18/2017  . CHOLECYSTECTOMY OPEN  1976  . CLOSED REDUCTION ANKLE FRACTURE Left 10/2006   "got steel rod in my leg; and screws"  . COLON SURGERY    . DILATION AND CURETTAGE OF UTERUS    . ESOPHAGEAL MANOMETRY  03/21/2012   Procedure: ESOPHAGEAL MANOMETRY (EM);  Surgeon: [name], MD;  Location: [facility] END[facility];  Service: Endoscopy;  Laterality: N/A;  . FRACTURE SURGERY    . NEUROPLASTY / TRANSPOSITION MEDIAN NERVE AT CARPAL TUNNEL Left 2004  . NEUROPLASTY / TRANSPOSITION MEDIAN NERVE AT CARPAL TUNNEL Right 2002  . 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 SALPINGOOPHORECTOMY Bilateral 2000  . TEE WITHOUT CARDIOVERSION N/A 05/18/2017   Procedure: TRANSESOPHAGEAL ECHOCARDIOGRAM (TEE);  Surgeon: [name], MD;  Location: [facility] ENDOSCOPY;  Service: Cardiovascular;  Laterality: N/A;  . TUBAL LIGATION  1982    ROS- all systems are personally reviewed and negatives except as per HPI above  Current Outpatient Medications  Medication Sig Dispense Refill  . albuterol (PROAIR HFA) 108 (90 Base) MCG/ACT inhaler inhale 2 puffs every 6 hours if needed for wheezing or shortness of breath 8.5 g 1  . albuterol (PROVENTIL) (2.5 MG/3ML) 0.083% nebulizer solution Take 3 mLs (2.5 mg total) by nebulization every 4 (four) hours as  needed for wheezing or shortness of breath (dx: J45.31). 75 mL 5  . amiodarone (PACERONE) 200 MG tablet Take 1 tablet (200 mg total) by mouth daily. 180 tablet 0  . amLODipine (NORVASC) 10 MG tablet TAKE 1 TABLET BY MOUTH EVERY DAY 60 tablet 0  . amoxicillin-clavulanate (AUGMENTIN) 875-125 MG tablet Take 1 tablet by mouth 2 (two) times daily. 20 tablet 0  . atorvastatin (LIPITOR) 80 MG tablet Take 80 mg by mouth at bedtime.  0  . calcium carbonate (OS-CAL) 600 MG TABS tablet Take 600 mg by mouth daily.    . cholecalciferol (VITAMIN D) 1000 UNITS tablet Take 1,000 Units by mouth 2 (two) times daily.     . Cranberry 500 MG CAPS Take 500 mg by mouth 2 (two) times daily.    . diphenoxylate-atropine (LOMOTIL) 2.5-0.025 MG tablet Take 1 tablet by mouth 2 (two) times daily as needed for diarrhea or loose stools. 30 tablet 0  . DULoxetine (CYMBALTA) 60 MG capsule Take 60 mg by mouth at bedtime.     Marland Kitchen esomeprazole (NEXIUM) 40 MG capsule Take 1 capsule (40 mg total) by mouth 2 (two) times daily. Take 30 minutes before breakfast and dinner. 60 capsule 3  . EVZIO 0.4 MG/0.4ML SOAJ Inject 0.4 mLs as directed daily as needed (for overdose).   0  . fluticasone (FLONASE) 50 MCG/ACT nasal spray instill 2 sprays into each nostril once daily (Patient taking differently: Place 1 spray into both nostrils 2 (two) times daily. ) 16 g 6  . guaiFENesin (ROBITUSSIN MUCUS+CHEST CONGEST) 100 MG/5ML liquid Take 10 mLs (200 mg total) by mouth 3 (three) times daily as needed for cough. 120 mL 0  . HUMALOG KWIKPEN 100 UNIT/ML KiwkPen Inject 18-32 Units into Toni skin 3 (three) times daily with meals. PER Patient home SLIDING SCALE 18 units if CBG < 200 32 units  If CBG  > 200  0  . Insulin Glargine (LANTUS SOLOSTAR) 100 UNIT/ML Solostar Pen Inject 40 Units into Toni skin at bedtime. (Patient taking differently: Inject 50 Units into Toni skin at bedtime. ) 15 mL 11  . irbesartan (AVAPRO) 75 MG tablet Take 1 tablet by mouth daily.  11   . loperamide (IMODIUM A-D) 2 MG tablet Take 1 tablet (2 mg total) by mouth 4 (four) times daily as needed for diarrhea or loose stools. 30 tablet 0  . loratadine (CLARITIN) 10 MG tablet Take 1 tablet (10 mg total) by mouth daily. 30 tablet 0  . magnesium oxide (MAG-OX) 400 MG tablet Take 1 tablet (400 mg total) by mouth 2 (two) times daily. (Patient taking differently: Take 400 mg by mouth 2 (two) times daily. ) 60 tablet 11  . mometasone-formoterol (DULERA) 200-5 MCG/ACT AERO Inhale 2 puffs into Toni lungs 2 (two) times daily. 1 Inhaler 3  . nitroGLYCERIN (NITROSTAT) 0.4 MG SL tablet Place 1 tablet (0.4 mg total) under Toni tongue every 5 (five) minutes as needed for chest pain (do nto exceed 3 doses). (Patient taking differently:  Place 0.4 mg under Toni tongue every 5 (five) minutes x 3 doses as needed for chest pain. ) 25 tablet 2  . ondansetron (ZOFRAN) 4 MG tablet Take 1 tablet (4 mg total) by mouth every 8 (eight) hours as needed for nausea or vomiting. 20 tablet 0  . oxyCODONE-acetaminophen (PERCOCET) 10-325 MG tablet Take 1 tablet by mouth 5 (five) times daily. (scheduled)     . Polyvinyl Alcohol-Povidone (REFRESH OP) Place 1 drop into both eyes 3 (three) times daily as needed (for dry eyes).     . potassium chloride (K-DUR) 10 MEQ tablet take 1 tablet by mouth once daily (Patient taking differently: Take 10 MEQ by mouth once daily) 90 tablet 3  . pregabalin (LYRICA) 100 MG capsule Take 100 mg by mouth 3 (three) times daily.    . rivaroxaban (XARELTO) 20 MG TABS tablet Take 20 mg by mouth once a day with supper (Patient taking differently: Take 20 mg by mouth daily with supper. ) 30 tablet 9  . tamsulosin (FLOMAX) 0.4 MG CAPS capsule Take 0.4 mg by mouth at bedtime.  0  . vitamin C (ASCORBIC ACID) 500 MG tablet Take 500 mg by mouth 2 (two) times daily.    . VOLTAREN 1 % GEL APPLY 2 INCHES TO AFFECTED AREA FOUR TIMES A DAY (Patient taking differently: APPLY 2 INCHES TO AFFECTED AREA THREE TIMES A  DAY) 500 g 3   No current facility-administered medications for this visit.     Physical Exam: Vitals:   02/21/18 1006  BP: 124/64  Pulse: 76  Weight: 217 lb (98.4 kg)  Height: 5\' 7"  (1.702 m)    GEN- Toni patient is [name] appearing, alert and oriented x 3 today.   Head- normocephalic, atraumatic Eyes-  Sclera clear, conjunctiva pink Ears- hearing intact Oropharynx- clear Lungs- Clear to ausculation bilaterally, normal work of breathing Heart- Regular rate and rhythm, no murmurs, rubs or gallops, PMI not laterally displaced GI- soft, NT, ND, + BS Extremities- no clubbing, cyanosis, or edema  EKG tracing ordered today is personally reviewed and shows sinus rhythm  Assessment and Plan:  1. Paroxysmal atrial fibrillation/ atrial flutter Doing [name] s/p [name] Loaded with amiodarone prior to [name].  Toni is not ready to stop amiodarone at this time.   Reduce amiodarone to 100mg  daily today.  Toni may be more willing to stop this upon return. chads2vasc score is 4.  Continue xarelto  2. Post termination pauses Resolved post [name] Toni has declined PPM  3. Obesity Body mass index is 33.99 kg/m. Wt Readings from Last 3 Encounters:  02/21/18 217 lb (98.4 kg)  02/14/18 221 lb (100.2 kg)  02/02/18 215 lb 3.2 oz (97.6 kg)   4. HTN Stable No change required today  5. AMS Hospitalized again post [name] Unclear etiology Neurology and cardiology causes both excluded during that hospitalization.  Return to see me in 3 months  Hillis RangeJames Elizebeth Kluesner MD, [facility] 02/21/2018 11:23 AM

## 2018-02-21 NOTE — Progress Notes (Signed)
[facility] PREP Progress Report   Patient Details  Name: Toni RosenthalLinda F [name] MRN: [id number] Date of Birth: December 12, 1950 Age: 67 y.o. PCP: Leeroy BockAnderson, Chelsey L, DO  Vitals:   02/08/18 1523  BP: 134/86  Pulse: 73  SpO2: 98%  Weight: 218 lb 9.6 oz (99.2 kg)     [facility] Eval - 02/21/18 1500      Referral    Referring Provider  AFIB clinic    Reason for referral  Diabetes;Hypertension;Inactivity;Obesitity/Overweight;Orthopedic    Program Start Date  03/01/18      Measurement   Waist Circumference  43 inches    Hip Circumference  44 inches    Body fat  42.8 percent      Information for Trainer   Goals  "increase stamina & walk w/o a cane and to walk 2 miles unassisted w/o shob.  To lose 12 lbs.  To go back to CNA work."    Current Exercise  "leg lift and other PT provided exercises & some walking"    Orthopedic Concerns  Low back pain, Bilat shoulder pain, RT leg gets numb when stands too long, weak wrists & elbows."    Pertinent Medical History  AFIB, diabetic neuropathy, seizures"    Current Barriers  Possible transportation issues-doesn't drive d/t syncope"    Restrictions/Precautions  Diabetic snack before exercise;Assistive device    Medications that affect exercise  Medication causing dizziness/drowsiness      Timed Up and Go (TUGS)   Timed Up and Go  Moderate risk 10-12 seconds      Quality of Life Assessment [RPT]   Date of Last Quality of Life Assessment [RPT]  -- 11.28 seconds   11.28 seconds     Mobility and Daily Activities   I find it easy to walk up or down two or more flights of stairs.  1    I have no trouble taking out the trash.  2    I do housework such as vacuuming and dusting on my own without difficulty.  2    I can easily lift a gallon of milk (8lbs).  1    I can easily walk a mile.  1    I have no trouble reaching into high cupboards or reaching down to pick up something from the floor.  1    I do not have trouble doing out-door work such as Associate Professormoving  the lawn, raking leaves, or gardening.  1      Mobility and Daily Activities   I feel younger than my age.  2    I feel independent.  1    I feel energetic.  1    I live an active life.   1    I feel strong.  1    I feel healthy.  1    I feel active as other people my age.  1      How fit and strong are you.   Fit and Strong Total Score  17      Past Medical History:  Diagnosis Date  . Anxiety   . Arthritis    "knees, hands, ankles, feet" (05/12/2016)  . Asthma    Dr. Sherene SiresWert  . Atrial fibrillation (HCC)   . Bipolar disorder (HCC)   . Chronic back pain    "lower and middle" (05/12/2016)  . Chronic kidney disease (CKD), stage III (moderate) (HCC) 05/14/2015  . CKD (chronic kidney disease), stage III (HCC)   . Colon polyps   .  Depression   . Diverticulosis   . Epilepsy (HCC)   . Essential hypertension   . Fibromyalgia   . Gallstones   . GERD (gastroesophageal reflux disease)   . H/O hiatal hernia   . Hyperlipidemia   . IBS (irritable bowel syndrome)   . Insulin dependent diabetes mellitus (HCC)   . Paroxysmal A-fib (HCC)    failed medical therapy with tikosyn, s/p AF ablation x 2  . Paroxysmal A-fib (HCC)    a. On [name]. b. Recurrence 08/2014 in setting of GI illness.  . Paroxysmal atrial fibrillation (HCC) 05/18/2017  . Peripheral neuropathy   . Persistent atrial fibrillation (HCC)   . Sarcoidosis of lung (HCC)    Dr. [name]  . Seizures (HCC)    "epileptic; pretty regular recently" (05/12/2016)  . Syncope   . Type II diabetes mellitus (HCC)   . Type II diabetes mellitus (HCC)    Past Surgical History:  Procedure Laterality Date  . ABDOMINAL HYSTERECTOMY  1995  . ANTERIOR CERVICAL DECOMP/DISCECTOMY FUSION  07/11/2012   Procedure: ANTERIOR CERVICAL DECOMPRESSION/DISCECTOMY FUSION 1 LEVEL;  Surgeon: [name], MD;  Location: [facility];  Service: Neurosurgery;  Laterality: N/A;  Cervical Five-Six Anterior Cervical Decompression with Fusion Interbody Prothesis  Plating and Bonegraft  . ATRIAL FIBRILLATION ABLATION N/A 05/18/2017   Procedure: Atrial Fibrillation Ablation;  Surgeon: [name], MD;  Location: [facility] IN[facility] LAB;  Service: Cardiovascular;  Laterality: N/A;  . ATRIAL FIBRILLATION ABLATION N/A 11/16/2017   Procedure: ATRIAL FIBRILLATION ABLATION;  Surgeon: [name], MD;  Location: [facility];  Service: Cardiovascular;  Laterality: N/A;  . CARDIAC CATHETERIZATION  2012   a. Normal coronaries 2012. b. Normal nuc 09/2014.  Marland Kitchen CARDIAC CATHETERIZATION  05/18/2017  . CHOLECYSTECTOMY OPEN  1976  . CLOSED REDUCTION ANKLE FRACTURE Left 10/2006   "got steel rod in my leg; and screws"  . COLON SURGERY    . DILATION AND CURETTAGE OF UTERUS    . ESOPHAGEAL MANOMETRY  03/21/2012   Procedure: ESOPHAGEAL MANOMETRY (EM);  Surgeon: [name], MD;  Location: [facility] ENDOSCOPY;  Service: Endoscopy;  Laterality: N/A;  . FRACTURE SURGERY    . NEUROPLASTY / TRANSPOSITION MEDIAN NERVE AT CARPAL TUNNEL Left 2004  . NEUROPLASTY / TRANSPOSITION MEDIAN NERVE AT CARPAL TUNNEL Right 2002  . 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 SALPINGOOPHORECTOMY Bilateral 2000  . TEE WITHOUT CARDIOVERSION N/A 05/18/2017   Procedure: TRANSESOPHAGEAL ECHOCARDIOGRAM (TEE);  Surgeon: [name], MD;  Location: [facility] ENDOSCOPY;  Service: Cardiovascular;  Laterality: N/A;  . TUBAL LIGATION  1982   Social History   Tobacco Use  Smoking Status Never Smoker  Smokeless Tobacco Never Used     [name] is set to begin the "AFIB" PREP group at [facility] every Tues/Thurs from 11:30-12:30 for 12 weeks starting March 01, 2018.    Rose Fillers 02/21/2018, 3:34 PM

## 2018-02-22 ENCOUNTER — Other Ambulatory Visit: Payer: Self-pay

## 2018-02-22 ENCOUNTER — Telehealth: Payer: Self-pay

## 2018-02-22 MED ORDER — AMIODARONE HCL 200 MG PO TABS: ORAL_TABLET | ORAL | 3 refills | Status: DC

## 2018-02-22 NOTE — Telephone Encounter (Signed)
**Note De-Identified Brance Dartt Obfuscation** [facility]. I attempted to do this PA through covermymeds but received the following message:  "The following alternative(s) is/are the preferred alternative(s): Amiodarone 200mg  tablet or Pacerone 200mg  tablet. Would you like to switch to the provided preferred alternative(s)?"  I changed the pts RX to Amiodarone 200 mg with instruction to take 1/2 tablet (100 mg) daily #45 with 3 refills.  I called [facility] and made them aware that I changed the mg and directions on the pts Amiodarone as advised in an attempt to get coverage from the pts insurance [facility] [employer]).  They state that they will contact us if Amiodarone 200 mg is not covered.  I also called the pt and made her aware to only take 1/2 (100 mg) of a 200 mg Amiodarone tablet daily.  She verbalized understanding and thanked me for calling to let her know about the change. She is aware that if her insurance does not cover Amiodarone 200 mg either that I will call her to let her know.

## 2018-02-24 ENCOUNTER — Encounter: Payer: Self-pay | Admitting: Pulmonary Disease

## 2018-02-24 ENCOUNTER — Ambulatory Visit (INDEPENDENT_AMBULATORY_CARE_PROVIDER_SITE_OTHER): Payer: Medicare Other | Admitting: Pulmonary Disease

## 2018-02-24 VITALS — BP 126/74 | HR 83 | Ht 67.0 in | Wt 221.6 lb

## 2018-02-24 DIAGNOSIS — J4530 Mild persistent asthma, uncomplicated: Secondary | ICD-10-CM | POA: Diagnosis not present

## 2018-02-24 LAB — NITRIC OXIDE: Nitric Oxide: 10

## 2018-02-24 NOTE — Progress Notes (Signed)
Toni RosenthalLinda F [name]    [id number]    [birth year]  Primary Care Physician:Anderson, Lorette Anghelsey L, DO  Referring Physician: Berton BonMikell, [name], MD No address on file  Chief complaint: Follow-up for asthma.  HPI: 67 year old with history of ACE inhibitor induced cough, sarcoid, asthma, atrial fibrillation on Xarelto, hypertension, diabetes. She has a clinical diagnosis of sarcoid in 1995.  Transbronchial biopsy negative but has classic chest x-ray findings.  She was previously on prednisone.  Has been off steroids since 2011 Seen previously by Dr. Sherene SiresWert for cough.  Had an episode of bronchitis last month.  Elwin SleightDulera was increased to 200 at last office visit Patient returns to the clinic today stating that breathing is at baseline.  She has chronic cough, no mucus production.  No dyspnea, wheezing  This is her first clinic visit with me.  I have reviewed her history with her.  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 Exposures: No known exposures Smoking history: Never smoker Travel history: No significant travel  Outpatient Encounter Medications as of 02/24/2018  Medication Sig  . albuterol (PROAIR HFA) 108 (90 Base) MCG/ACT inhaler inhale 2 puffs every 6 hours if needed for wheezing or shortness of breath  . albuterol (PROVENTIL) (2.5 MG/3ML) 0.083% nebulizer solution Take 3 mLs (2.5 mg total) by nebulization every 4 (four) hours as needed for wheezing or shortness of breath (dx: J45.31).  Marland Kitchen. amiodarone (PACERONE) 100 MG tablet Take 1 tablet (100 mg total) by mouth daily.  Marland Kitchen. amiodarone (PACERONE) 200 MG tablet Take 1/2 tablet daily  . amLODipine (NORVASC) 10 MG tablet TAKE 1 TABLET BY MOUTH EVERY DAY  . amoxicillin-clavulanate (AUGMENTIN) 875-125 MG tablet Take 1 tablet by mouth 2 (two) times daily.  Marland Kitchen. atorvastatin (LIPITOR) 80 MG tablet Take 80 mg by mouth at  bedtime.  . calcium carbonate (OS-CAL) 600 MG TABS tablet Take 600 mg by mouth daily.  . cholecalciferol (VITAMIN D) 1000 UNITS tablet Take 1,000 Units by mouth 2 (two) times daily.   . Cranberry 500 MG CAPS Take 500 mg by mouth 2 (two) times daily.  . diphenoxylate-atropine (LOMOTIL) 2.5-0.025 MG tablet Take 1 tablet by mouth 2 (two) times daily as needed for diarrhea or loose stools.  . DULoxetine (CYMBALTA) 60 MG capsule Take 60 mg by mouth at bedtime.   Marland Kitchen. esomeprazole (NEXIUM) 40 MG capsule Take 1 capsule (40 mg total) by mouth 2 (two) times daily. Take 30 minutes before breakfast and dinner.  Marland Kitchen. EVZIO 0.4 MG/0.4ML SOAJ Inject 0.4 mLs as directed daily as needed (for overdose).   . fluticasone (FLONASE) 50 MCG/ACT nasal spray instill 2 sprays into each nostril once daily (Patient taking differently: Place 1 spray into both nostrils 2 (two) times daily. )  . guaiFENesin (ROBITUSSIN MUCUS+CHEST CONGEST) 100 MG/5ML liquid Take 10 mLs (200 mg total) by mouth 3 (three) times daily as needed for cough.  Marland Kitchen. HUMALOG KWIKPEN 100 UNIT/ML KiwkPen Inject 18-32 Units into the skin 3 (three) times daily with meals. PER Patient home SLIDING SCALE 18 units if CBG < 200 32 units  If CBG  > 200  . Insulin Glargine (LANTUS SOLOSTAR) 100 UNIT/ML Solostar Pen Inject 40 Units into the skin at bedtime. (Patient taking differently: Inject 50 Units into the skin at bedtime. )  . irbesartan (AVAPRO) 75 MG tablet Take 1  tablet by mouth daily.  Marland Kitchen loperamide (IMODIUM A-D) 2 MG tablet Take 1 tablet (2 mg total) by mouth 4 (four) times daily as needed for diarrhea or loose stools.  Marland Kitchen loratadine (CLARITIN) 10 MG tablet Take 1 tablet (10 mg total) by mouth daily.  . magnesium oxide (MAG-OX) 400 MG tablet Take 1 tablet (400 mg total) by mouth 2 (two) times daily. (Patient taking differently: Take 400 mg by mouth 2 (two) times daily. )  . mometasone-formoterol (DULERA) 200-5 MCG/ACT AERO Inhale 2 puffs into the lungs 2 (two) times  daily.  . nitroGLYCERIN (NITROSTAT) 0.4 MG SL tablet Place 1 tablet (0.4 mg total) under the tongue every 5 (five) minutes as needed for chest pain (do nto exceed 3 doses). (Patient taking differently: Place 0.4 mg under the tongue every 5 (five) minutes x 3 doses as needed for chest pain. )  . ondansetron (ZOFRAN) 4 MG tablet Take 1 tablet (4 mg total) by mouth every 8 (eight) hours as needed for nausea or vomiting.  Marland Kitchen oxyCODONE-acetaminophen (PERCOCET) 10-325 MG tablet Take 1 tablet by mouth 5 (five) times daily. (scheduled)   . Polyvinyl Alcohol-Povidone (REFRESH OP) Place 1 drop into both eyes 3 (three) times daily as needed (for dry eyes).   . potassium chloride (K-DUR) 10 MEQ tablet take 1 tablet by mouth once daily (Patient taking differently: Take 10 MEQ by mouth once daily)  . pregabalin (LYRICA) 100 MG capsule Take 100 mg by mouth 3 (three) times daily.  . rivaroxaban (XARELTO) 20 MG TABS tablet Take 20 mg by mouth once a day with supper (Patient taking differently: Take 20 mg by mouth daily with supper. )  . tamsulosin (FLOMAX) 0.4 MG CAPS capsule Take 0.4 mg by mouth at bedtime.  . vitamin C (ASCORBIC ACID) 500 MG tablet Take 500 mg by mouth 2 (two) times daily.  . VOLTAREN 1 % GEL APPLY 2 INCHES TO AFFECTED AREA FOUR TIMES A DAY (Patient taking differently: APPLY 2 INCHES TO AFFECTED AREA THREE TIMES A DAY)   No facility-administered encounter medications on file as of 02/24/2018.     Allergies as of 02/24/2018 - Review Complete 02/21/2018  Allergen Reaction Noted  . Prednisone Other (See Comments) 07/07/2012  . Amitriptyline Other (See Comments) 08/25/2011  . Hydromorphone hcl Other (See Comments)   . Lisinopril Cough 12/21/2017    Past Medical History:  Diagnosis Date  . Anxiety   . Arthritis    "knees, hands, ankles, feet" (05/12/2016)  . Asthma    Dr. [name]  . Atrial fibrillation (HCC)   . Bipolar disorder (HCC)   . Chronic back pain    "lower and middle" (05/12/2016)    . Chronic kidney disease (CKD), stage III (moderate) (HCC) 05/14/2015  . CKD (chronic kidney disease), stage III (HCC)   . Colon polyps   . Depression   . Diverticulosis   . Epilepsy (HCC)   . Essential hypertension   . Fibromyalgia   . Gallstones   . GERD (gastroesophageal reflux disease)   . H/O hiatal hernia   . Hyperlipidemia   . IBS (irritable bowel syndrome)   . Insulin dependent diabetes mellitus (HCC)   . Paroxysmal A-fib (HCC)    failed medical therapy with tikosyn, s/p AF ablation x 2  . Paroxysmal A-fib (HCC)    a. On [name]. b. Recurrence 08/2014 in setting of GI illness.  . Paroxysmal atrial fibrillation (HCC) 05/18/2017  . Peripheral neuropathy   . Persistent atrial fibrillation (HCC)   .  Sarcoidosis of lung (HCC)    Dr. [name]  . Seizures (HCC)    "epileptic; pretty regular recently" (05/12/2016)  . Syncope   . Type II diabetes mellitus (HCC)   . Type II diabetes mellitus (HCC)     Past Surgical History:  Procedure Laterality Date  . ABDOMINAL HYSTERECTOMY  1995  . ANTERIOR CERVICAL DECOMP/DISCECTOMY FUSION  07/11/2012   Procedure: ANTERIOR CERVICAL DECOMPRESSION/DISCECTOMY FUSION 1 LEVEL;  Surgeon: [name], MD;  Location: [facility];  Service: Neurosurgery;  Laterality: N/A;  Cervical Five-Six Anterior Cervical Decompression with Fusion Interbody Prothesis Plating and Bonegraft  . ATRIAL FIBRILLATION ABLATION N/A 05/18/2017   Procedure: Atrial Fibrillation Ablation;  Surgeon: [name], MD;  Location: [facility];  Service: Cardiovascular;  Laterality: N/A;  . ATRIAL FIBRILLATION ABLATION N/A 11/16/2017   Procedure: ATRIAL FIBRILLATION ABLATION;  Surgeon: [name], MD;  Location: [facility] CV [facility];  Service: Cardiovascular;  Laterality: N/A;  . CARDIAC CATHETERIZATION  2012   a. Normal coronaries 2012. b. Normal nuc 09/2014.  Marland Kitchen CARDIAC CATHETERIZATION  05/18/2017  . CHOLECYSTECTOMY OPEN  1976  . CLOSED REDUCTION ANKLE FRACTURE Left  10/2006   "got steel rod in my leg; and screws"  . COLON SURGERY    . DILATION AND CURETTAGE OF UTERUS    . ESOPHAGEAL MANOMETRY  03/21/2012   Procedure: ESOPHAGEAL MANOMETRY (EM);  Surgeon: [name], MD;  Location: [facility] ENDOSCOPY;  Service: Endoscopy;  Laterality: N/A;  . FRACTURE SURGERY    . NEUROPLASTY / TRANSPOSITION MEDIAN NERVE AT CARPAL TUNNEL Left 2004  . NEUROPLASTY / TRANSPOSITION MEDIAN NERVE AT CARPAL TUNNEL Right 2002  . 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 SALPINGOOPHORECTOMY Bilateral 2000  . TEE WITHOUT CARDIOVERSION N/A 05/18/2017   Procedure: TRANSESOPHAGEAL ECHOCARDIOGRAM (TEE);  Surgeon: [name], MD;  Location: [facility] ENDOSCOPY;  Service: Cardiovascular;  Laterality: N/A;  . TUBAL LIGATION  1982    Family History  Problem Relation Age of Onset  . Heart attack Mother        @ age 66  . Mental illness Mother   . Diabetes Mother   . Hypertension Mother        siblings  . Alzheimer's disease Mother   . Depression Mother   . Hyperlipidemia Mother   . Heart attack Brother 50  . Alcohol abuse Brother   . Depression Brother   . Diabetes Brother   . Hyperlipidemia Brother   . Hypertension Brother   . Kidney disease Brother   . Drug abuse Brother   . Alcohol abuse Father   . Heart attack Father   . Hyperlipidemia Father   . Hypertension Father   . Colon cancer Maternal Aunt   . Prostate cancer Maternal Grandfather   . Diabetes Maternal Grandfather   . Hyperlipidemia Maternal Grandfather   . Ovarian cancer Maternal Aunt   . Diabetes Unknown   . Hypertension Unknown   . Lupus Sister   . Alcohol abuse Sister   . Depression Sister   . Diabetes Sister   . Hyperlipidemia Sister   . Hypertension Sister   . Kidney disease Sister   . Drug abuse Sister   . Ovarian cancer Cousin   . Diabetes Maternal Grandmother   . Hyperlipidemia Maternal Grandmother   . Breast cancer Neg Hx     Social History   Socioeconomic History  . Marital status:  Divorced    Spouse name: Not on file  . Number  of children: 3  . Years of education: 12th  . Highest education level: Not on file  Occupational History  . Occupation: disabled    Comment: CNA  Social Needs  . Financial resource strain: Not on file  . Food insecurity:    Worry: Not on file    Inability: Not on file  . Transportation needs:    Medical: Not on file    Non-medical: Not on file  Tobacco Use  . Smoking status: Never Smoker  . Smokeless tobacco: Never Used  Substance and Sexual Activity  . Alcohol use: No  . Drug use: No  . Sexual activity: Not Currently  Lifestyle  . Physical activity:    Days per week: Not on file    Minutes per session: Not on file  . Stress: Not on file  Relationships  . Social connections:    Talks on phone: Not on file    Gets together: Not on file    Attends religious service: Not on file    Active member of club or organization: Not on file    Attends meetings of clubs or organizations: Not on file    Relationship status: Not on file  . Intimate partner violence:    Fear of current or ex partner: Not on file    Emotionally abused: Not on file    Physically abused: Not on file    Forced sexual activity: Not on file  Other Topics Concern  . Not on file  Social History Narrative   Pt. Lives at home with her daughter. She is single, has three children, does not work currently, and has a 12th grade education level. She has never used tobacco or alcohol. Quit using illicit drugs at the age of 23 and rarely ha caffeine.    Review of systems: Review of Systems  Constitutional: Negative for fever and chills.  HENT: Negative.   Eyes: Negative for blurred vision.  Respiratory: as per HPI  Cardiovascular: Negative for chest pain and palpitations.  Gastrointestinal: Negative for vomiting, diarrhea, blood per rectum. Genitourinary: Negative for dysuria, urgency, frequency and hematuria.  Musculoskeletal: Negative for myalgias, back pain and  joint pain.  Skin: Negative for itching and rash.  Neurological: Negative for dizziness, tremors, focal weakness, seizures and loss of consciousness.  Endo/Heme/Allergies: Negative for environmental allergies.  Psychiatric/Behavioral: Negative for depression, suicidal ideas and hallucinations.  All other systems reviewed and are negative.  Physical Exam: There were no vitals taken for this visit. Gen:      No acute distress HEENT:  EOMI, sclera anicteric Neck:     No masses; no thyromegaly Lungs:    Clear to auscultation bilaterally; normal respiratory effort CV:         Regular rate and rhythm; no murmurs Abd:      + bowel sounds; soft, non-tender; no palpable masses, no distension Ext:    No edema; adequate peripheral perfusion Skin:      Warm and dry; no rash Neuro: alert and oriented x 3 Psych: normal mood and affect  Data Reviewed: FENO 02/24/18-10  Echocardiogram 11/23/2017-LVEF 60-65%, dilated IVC.  PFTs 06/29/2015 FVC 2.60 [89%], FEV1 2.19 [96%], F/F 84, TLC 6 6%, DLCO 70% Mild restriction, diffusion impairment.  Chest x-ray 11/23/2017- mild left base atelectasis.  Assessment:  Sarcoidosis Diagnosed based on x-ray findings of lymphadenopathy.  No biopsy diagnosis Currently not on prednisone.  She has stable symptoms with no evidence of recurrence Continue monitoring  Asthma Continue Dulera 200, albuterol  as needed Reorder PFTs for reassessment  Plan/Recommendations: - PFTs - Continue Elwin Sleight, albuterol  Chilton Greathouse MD [facility] 02/24/2018, 11:24 AM  CC: [name], MD

## 2018-02-24 NOTE — Patient Instructions (Signed)
I am glad you are doing well with the breathing Continue the [facility] as prescribed We will schedule a pulmonary function test to reevaluate your lung since her last testosterone 2016 Follow-up in 3 months

## 2018-03-01 ENCOUNTER — Other Ambulatory Visit: Payer: Self-pay | Admitting: Internal Medicine

## 2018-03-01 ENCOUNTER — Other Ambulatory Visit: Payer: Self-pay | Admitting: Cardiovascular Disease

## 2018-03-17 ENCOUNTER — Encounter: Payer: Self-pay | Admitting: Student in an Organized Health Care Education/Training Program

## 2018-03-22 ENCOUNTER — Telehealth: Payer: Self-pay | Admitting: *Deleted

## 2018-03-22 NOTE — Telephone Encounter (Signed)
Hello,  Patient has only been prescribed 10mg  amlodipine here. Her BP has been well controlled on that medication so I would continue with that dosage. I also do not have on file that we ever prescribed the lisinopril. Unless I'm completely missing it, I think if she's been taking it, then it was from a different provider.

## 2018-03-22 NOTE — Telephone Encounter (Signed)
[facility] pharmacy calling to verify correct dosage for patient's generic Norvasc since they have 2 different strengths on file.  Should patient be on 5 mg or 10 mg daily?  Also, was lisinopril discontinued?  Will route note to PCP and call pharmacy back.  Altamese Dilling~Glennda Weatherholtz, BSN, RN-BC

## 2018-03-30 ENCOUNTER — Other Ambulatory Visit: Payer: Self-pay | Admitting: Student in an Organized Health Care Education/Training Program

## 2018-03-30 DIAGNOSIS — I10 Essential (primary) hypertension: Secondary | ICD-10-CM

## 2018-03-30 MED ORDER — AMLODIPINE BESYLATE 10 MG PO TABS: 10.0000 mg | ORAL_TABLET | Freq: Every day | ORAL | 3 refills | Status: AC

## 2018-03-30 NOTE — Telephone Encounter (Signed)
Pt is calling back again checking on her generic Norvasc. She said it has not been refilled yet and that not having it is causing her blood pressure to go up. She said it has been high for the last couple days. She said she spoke with the pharmacy and it is the 10mg  of generic Norvasc that should be refilled. Pt would like someone to contact her to know when it is being refilled.

## 2018-03-30 NOTE — Telephone Encounter (Signed)
Called and spoke with [facility] pharmacist - informed patient should be on amlodopine 10 mg daily per Dr. Dareen PianoAnderson. Patient informed pharmacy has been notified.   Altamese Dilling~Jeannette Richardson, BSN, RN-BC

## 2018-03-30 NOTE — Telephone Encounter (Signed)
I have refilled her medication and given 3 refills.

## 2018-04-08 ENCOUNTER — Other Ambulatory Visit: Payer: Self-pay | Admitting: Internal Medicine

## 2018-04-08 ENCOUNTER — Other Ambulatory Visit: Payer: Self-pay

## 2018-04-08 DIAGNOSIS — N183 Chronic kidney disease, stage 3 unspecified: Secondary | ICD-10-CM

## 2018-04-08 MED ORDER — NITROGLYCERIN 0.4 MG SL SUBL: SUBLINGUAL_TABLET | SUBLINGUAL | 2 refills | Status: AC

## 2018-04-20 ENCOUNTER — Other Ambulatory Visit: Payer: Self-pay | Admitting: Internal Medicine

## 2018-04-21 ENCOUNTER — Ambulatory Visit (INDEPENDENT_AMBULATORY_CARE_PROVIDER_SITE_OTHER): Payer: Medicare Other | Admitting: Family Medicine

## 2018-04-21 VITALS — BP 120/70 | HR 74 | Temp 98.0°F | Ht 67.0 in | Wt 224.0 lb

## 2018-04-21 DIAGNOSIS — Z23 Encounter for immunization: Secondary | ICD-10-CM | POA: Diagnosis not present

## 2018-04-21 DIAGNOSIS — J302 Other seasonal allergic rhinitis: Secondary | ICD-10-CM

## 2018-04-21 MED ORDER — FLUTICASONE PROPIONATE 50 MCG/ACT NA SUSP: NASAL | 6 refills | Status: DC

## 2018-04-21 NOTE — Patient Instructions (Signed)
Thank you for coming in to see [place] today. Please see below to review our plan for today's visit.  Her symptoms are consistent with seasonal allergies.  Continue taking her loratadine daily.  In addition, I have prescribed you a refill for your Flonase nasal spray.  Spray to times into each nostril daily aiming in the direction of your ear in a V-shaped pattern.  Once her symptoms improve which I would expect over the next several days, you can decrease the sprays to 1 spray daily.  You will need to stay on this medication daily to prevent future flares.  Use a humidifier to prevent your nasal cavities from drying out.  You can take ibuprofen as needed for inflammation and pain. Follow-up with your pulmonologist as needed and discuss need for an allergist if your symptoms are not well controlled with daily antihistamine.  Please call the clinic at [phone] if your symptoms worsen or you have any concerns. It was our pleasure to serve you.  Durward Parcel, DO [facility], PGY-3

## 2018-04-21 NOTE — Progress Notes (Signed)
   Subjective   Patient ID: [name]    DOB: 1950/12/21, 67 y.o. female   MRN: [id number]  CC: "Allergies"  HPI: [name] is a 67 y.o. female who presents to clinic today for the following:  ALLERGIES  Onset: 5 days ago Seasonal symptoms: yes Nasal discharge: yes Medications tried: Mucinex for last week  Symptoms Sneezing: Yes Scratchy throat: Yes Fever: No Headache or face pain: Yes Tooth pain: Yes Sore throat or swollen glands: No Severe fatigue: No Stiff neck: No Shortness of breath: No Rash: No  ROS: see HPI for pertinent.  PMFSH: DM, HTN, HLD, CKD, sarcoidosis, asthma, GERD, orthostatic hypotension, biopolar and seizure disorder, PAR. Surgical and family history reviewed.  Smoking status reviewed. Medications reviewed.  Objective   BP 120/70 (BP Location: Right Arm, Patient Position: Sitting, Cuff Size: Large)   Pulse 74   Temp 98 F (36.7 C) (Oral)   Ht 5\' 7"  (1.702 m)   Wt 224 lb (101.6 kg)   SpO2 97%   BMI 35.08 kg/m  Vitals and nursing note reviewed.  General: well nourished, well developed, NAD with non-toxic appearance HEENT: normocephalic, atraumatic, moist mucous membranes, slightly edematous and erythematous turbinates with minimal discharge, erythematous nasopharynx without tonsillar edema, blue shiners under eyes bilaterally with associated watery eyes bilaterally Neck: supple, non-tender without lymphadenopathy Cardiovascular: regular rate and rhythm without murmurs, rubs, or gallops Lungs: clear to auscultation bilaterally with normal work of breathing Skin: warm, dry, no rashes or lesions, cap refill < 2 seconds  Assessment & Plan   Seasonal allergies Chronic.  Uncontrolled despite daily antihistamine use.  No signs of secondary bacterial infection.  Does have asthma with increased use of albuterol though appears to be well-controlled.  No signs of asthma exacerbation on exam. - Given Flonase 50 mcg/ACT nasal spray with  instructions to use 2 sprays in each nostril daily and creased to 1 spray in each nostril once symptoms are well controlled and continue daily along with loratadine 10 mg daily - Discussed other conservative measures including humidifier increase fluid intake - Instructed patient to follow-up with pulmonologist and discuss possible consideration for allergist  Orders Placed This Encounter  Procedures  . Flu Vaccine QUAD 36+ mos IM   Meds ordered this encounter  Medications  . fluticasone (FLONASE) 50 MCG/ACT nasal spray    Sig: instill 2 sprays into each nostril once daily    Dispense:  16 g    Refill:  6    Durward Parcel, DO [facility], PGY-3 04/22/2018, 9:35 AM

## 2018-04-22 ENCOUNTER — Other Ambulatory Visit: Payer: Self-pay | Admitting: Cardiovascular Disease

## 2018-04-22 DIAGNOSIS — J302 Other seasonal allergic rhinitis: Secondary | ICD-10-CM | POA: Insufficient documentation

## 2018-04-22 NOTE — Assessment & Plan Note (Addendum)
Chronic.  Uncontrolled despite daily antihistamine use.  No signs of secondary bacterial infection.  Does have asthma with increased use of albuterol though appears to be well-controlled.  No signs of asthma exacerbation on exam. - Given Flonase 50 mcg/ACT nasal spray with instructions to use 2 sprays in each nostril daily and creased to 1 spray in each nostril once symptoms are well controlled and continue daily along with loratadine 10 mg daily - Discussed other conservative measures including humidifier increase fluid intake - Instructed patient to follow-up with pulmonologist and discuss possible consideration for allergist

## 2018-04-25 ENCOUNTER — Ambulatory Visit (HOSPITAL_COMMUNITY)
Admission: EM | Admit: 2018-04-25 | Discharge: 2018-04-25 | Disposition: A | Discharge status: Home / Self Care | Payer: Medicare Other | Attending: Family Medicine | Admitting: Family Medicine

## 2018-04-25 ENCOUNTER — Encounter (HOSPITAL_COMMUNITY): Payer: Self-pay | Admitting: Emergency Medicine

## 2018-04-25 DIAGNOSIS — J0100 Acute maxillary sinusitis, unspecified: Secondary | ICD-10-CM

## 2018-04-25 MED ORDER — AMOXICILLIN-POT CLAVULANATE 875-125 MG PO TABS: 1.0000 | ORAL_TABLET | Freq: Two times a day (BID) | ORAL | 0 refills | Status: AC

## 2018-04-25 NOTE — ED Triage Notes (Signed)
Pt c/o L ear pain x 1 week

## 2018-04-25 NOTE — Discharge Instructions (Signed)
Push fluids to ensure adequate hydration and keep secretions thin.  Continue with allergy medications and daily nasal spray.  Complete course of antibiotics.  If symptoms worsen or do not improve in the next week to return to be seen or to follow up with your PCP.

## 2018-04-25 NOTE — ED Provider Notes (Signed)
[facility]    CSN: [id number] Arrival date & time: 04/25/18  1133     History   Chief Complaint Chief Complaint  Patient presents with  . Otalgia    HPI [name] is a 67 y.o. female.   [name] presents with complaints of worsening of facial pressure, congestion which is causing left ear pain. Saw her PCP three days ago and nasal spray was restarted. Symptoms started over a week ago. No known fevers. States has felt shaky at times. Cough present. Left ear pain and pressure. Feels worse over the past few days. No rash. No gi/gu complaints. Does take daily allergy medications. No sore throat. No known ill contacts. Extensive medical history to include asthma, afib, bipolar, back pain, CKD, epilepsy, fibromyalgia, gerd, neuropathy, DM, allergies.    ROS per HPI.      Past Medical History:  Diagnosis Date  . Anxiety   . Arthritis    "knees, hands, ankles, feet" (05/12/2016)  . Asthma    Dr. [name]  . Atrial fibrillation (HCC)   . Bipolar disorder (HCC)   . Chronic back pain    "lower and middle" (05/12/2016)  . Chronic kidney disease (CKD), stage III (moderate) (HCC) 05/14/2015  . CKD (chronic kidney disease), stage III (HCC)   . Colon polyps   . Depression   . Diverticulosis   . Epilepsy (HCC)   . Essential hypertension   . Fibromyalgia   . Gallstones   . GERD (gastroesophageal reflux disease)   . H/O hiatal hernia   . Hyperlipidemia   . IBS (irritable bowel syndrome)   . Insulin dependent diabetes mellitus (HCC)   . Paroxysmal A-fib (HCC)    failed medical therapy with tikosyn, s/p AF ablation x 2  . Paroxysmal A-fib (HCC)    a. On [name]. b. Recurrence 08/2014 in setting of GI illness.  . Paroxysmal atrial fibrillation (HCC) 05/18/2017  . Peripheral neuropathy   . Persistent atrial fibrillation (HCC)   . Sarcoidosis of lung (HCC)    Dr. [name]  . Seizures (HCC)    "epileptic; pretty regular recently" (05/12/2016)  . Syncope   . Type II diabetes  mellitus (HCC)   . Type II diabetes mellitus [facility])     Patient Active Problem List   Diagnosis Date Noted  . Seasonal allergies 04/22/2018  . Nausea without vomiting 01/26/2018  . Cough due to ACE inhibitor 12/21/2017  . Gait abnormality 11/30/2017  . Cardiac syncope 11/22/2017  . Bipolar disorder (HCC)   . Chronic back pain   . CKD (chronic kidney disease), stage III (HCC)   . Depression   . Anxiety   . Estrogen deficiency 06/07/2017  . Wellness examination 06/03/2017  . A-fib (HCC) 05/05/2017  . Back pain 12/11/2016  . Pelvic pain 11/25/2016  . Chronic pain 09/08/2016  . Spells of decreased attentiveness 09/08/2016  . Irritable bowel syndrome 03/03/2016  . Chronic leukopenia 09/13/2015  . PAF (paroxysmal atrial fibrillation) (HCC) 08/22/2015  . Morbid obesity (HCC) 07/11/2015  . Bipolar I disorder (HCC) 05/22/2015  . Seizure disorder (HCC)   . History of diverticulitis   . Syncope 06/29/2012  . Orthostatic hypotension 06/29/2012  . History of colonic polyps 09/01/2011  . Sarcoid (HCC) 09/01/2011  . Asthma 09/01/2011  . GERD (gastroesophageal reflux disease) 09/01/2011  . Diabetes mellitus type 2, uncontrolled, with complications (HCC) 11/19/2008  . Hyperlipidemia 11/19/2008  . Essential hypertension 11/19/2008    Past Surgical History:  Procedure Laterality Date  .  ABDOMINAL HYSTERECTOMY  1995  . ANTERIOR CERVICAL DECOMP/DISCECTOMY FUSION  07/11/2012   Procedure: ANTERIOR CERVICAL DECOMPRESSION/DISCECTOMY FUSION 1 LEVEL;  Surgeon: [name], MD;  Location: [facility];  Service: Neurosurgery;  Laterality: N/A;  Cervical Five-Six Anterior Cervical Decompression with Fusion Interbody Prothesis Plating and Bonegraft  . ATRIAL FIBRILLATION ABLATION N/A 05/18/2017   Procedure: Atrial Fibrillation Ablation;  Surgeon: [name], MD;  Location: [facility];  Service: Cardiovascular;  Laterality: N/A;  . ATRIAL FIBRILLATION ABLATION N/A 11/16/2017    Procedure: ATRIAL FIBRILLATION ABLATION;  Surgeon: [name], MD;  Location: [facility];  Service: Cardiovascular;  Laterality: N/A;  . CARDIAC CATHETERIZATION  2012   a. Normal coronaries 2012. b. Normal nuc 09/2014.  Marland Kitchen CARDIAC CATHETERIZATION  05/18/2017  . CHOLECYSTECTOMY OPEN  1976  . CLOSED REDUCTION ANKLE FRACTURE Left 10/2006   "got steel rod in my leg; and screws"  . COLON SURGERY    . DILATION AND CURETTAGE OF UTERUS    . ESOPHAGEAL MANOMETRY  03/21/2012   Procedure: ESOPHAGEAL MANOMETRY (EM);  Surgeon: [name], MD;  Location: [facility];  Service: Endoscopy;  Laterality: N/A;  . FRACTURE SURGERY    . NEUROPLASTY / TRANSPOSITION MEDIAN NERVE AT CARPAL TUNNEL Left 2004  . NEUROPLASTY / TRANSPOSITION MEDIAN NERVE AT CARPAL TUNNEL Right 2002  . 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 SALPINGOOPHORECTOMY Bilateral 2000  . TEE WITHOUT CARDIOVERSION N/A 05/18/2017   Procedure: TRANSESOPHAGEAL ECHOCARDIOGRAM (TEE);  Surgeon: [name], MD;  [facility];  Service: Cardiovascular;  Laterality: N/A;  . TUBAL LIGATION  1982    OB History   None      Home Medications    Prior to Admission medications   Medication Sig Start Date End Date Taking? Authorizing Provider  albuterol (PROAIR HFA) 108 (90 Base) MCG/ACT inhaler inhale 2 puffs every 6 hours if needed for wheezing or shortness of breath 08/30/17   Mikell, Antionette Poles, MD  albuterol (PROVENTIL) (2.5 MG/3ML) 0.083% nebulizer solution Take 3 mLs (2.5 mg total) by nebulization every 4 (four) hours as needed for wheezing or shortness of breath (dx: J45.31). 12/07/17   Parrett, Virgel Bouquet, NP  amiodarone (PACERONE) 100 MG tablet Take 1 tablet (100 mg total) by mouth daily. 02/21/18   Allred, Fayrene Fearing, MD  amiodarone (PACERONE) 200 MG tablet Take 1/2 tablet daily 02/22/18   Allred, Fayrene Fearing, MD  amLODipine (NORVASC) 10 MG tablet Take 1 tablet (10 mg total) by mouth daily. 03/30/18   Anderson, Chelsey L, DO    amoxicillin-clavulanate (AUGMENTIN) 875-125 MG tablet Take 1 tablet by mouth every 12 (twelve) hours for 10 days. 04/25/18 05/05/18  Georgetta Haber, NP  atorvastatin (LIPITOR) 80 MG tablet Take 80 mg by mouth at bedtime. 05/29/16   [provider]  calcium carbonate (OS-CAL) 600 MG TABS tablet Take 600 mg by mouth daily.    [provider]  cholecalciferol (VITAMIN D) 1000 UNITS tablet Take 1,000 Units by mouth 2 (two) times daily.     [provider]  Cranberry 500 MG CAPS Take 500 mg by mouth 2 (two) times daily.    [provider]  diphenoxylate-atropine (LOMOTIL) 2.5-0.025 MG tablet Take 1 tablet by mouth 2 (two) times daily as needed for diarrhea or loose stools. 12/06/17   Meredith Pel, NP  DULoxetine (CYMBALTA) 60 MG capsule Take 60 mg by mouth at bedtime.     [provider]  esomeprazole (NEXIUM) 40  MG capsule Take 1 capsule (40 mg total) by mouth 2 (two) times daily. Take 30 minutes before breakfast and dinner. 01/26/18   Mikell, Antionette Poles, MD  EVZIO 0.4 MG/0.4ML SOAJ Inject 0.4 mLs as directed daily as needed (for overdose).  07/10/15   [provider]  fluticasone Aleda Grana) 50 MCG/ACT nasal spray instill 2 sprays into each nostril once daily 04/21/18   Wendee Beavers, DO  guaiFENesin (ROBITUSSIN MUCUS+CHEST CONGEST) 100 MG/5ML liquid Take 10 mLs (200 mg total) by mouth 3 (three) times daily as needed for cough. 02/02/18   Mikell, Antionette Poles, MD  HUMALOG KWIKPEN 100 UNIT/ML KiwkPen Inject 18-32 Units into the skin 3 (three) times daily with meals. PER Patient home SLIDING SCALE 18 units if CBG < 200 32 units  If CBG  > 200 05/04/17   [provider]  Insulin Glargine (LANTUS SOLOSTAR) 100 UNIT/ML Solostar Pen Inject 40 Units into the skin at bedtime. Patient taking differently: Inject 50 Units into the skin at bedtime.  11/25/17   Garnette Gunner, MD  irbesartan (AVAPRO) 75 MG tablet Take 1 tablet by mouth daily.  12/23/17   [provider]  loperamide (IMODIUM A-D) 2 MG tablet Take 1 tablet (2 mg total) by mouth 4 (four) times daily as needed for diarrhea or loose stools. 09/02/17   Mikell, Antionette Poles, MD  loratadine (CLARITIN) 10 MG tablet TAKE 1 TABLET BY MOUTH ONCE DAILY 03/01/18   Anderson, Chelsey L, DO  magnesium oxide (MAG-OX) 400 MG tablet TAKE 1 TABLET BY MOUTH EVERY DAY 03/01/18   Wendall Stade, MD  mometasone-formoterol (DULERA) 200-5 MCG/ACT AERO Inhale 2 puffs into the lungs 2 (two) times daily. 01/21/18   Coral Ceo, NP  nitroGLYCERIN (NITROSTAT) 0.4 MG SL tablet PLACE 1 TABLET UNDER THE TONGUE IF NEEDED EVERY 5 MINUTES AS NEEDED FOR CHEST PAIN; DO NOT EXCEED 3 DOSES 04/08/18   Allred, Fayrene Fearing, MD  ondansetron (ZOFRAN) 4 MG tablet Take 1 tablet (4 mg total) by mouth every 8 (eight) hours as needed for nausea or vomiting. 10/08/17   Mikell, Antionette Poles, MD  oxyCODONE-acetaminophen (PERCOCET) 10-325 MG tablet Take 1 tablet by mouth 5 (five) times daily. (scheduled)     [provider]  Polyvinyl Alcohol-Povidone (REFRESH OP) Place 1 drop into both eyes 3 (three) times daily as needed (for dry eyes).     [provider]  potassium chloride (K-DUR) 10 MEQ tablet take 1 tablet by mouth once daily Patient taking differently: Take 10 MEQ by mouth once daily 05/04/17   Wendall Stade, MD  potassium chloride (K-DUR,KLOR-CON) 10 MEQ tablet Take 1 tablet (10 mEq total) by mouth daily. Please call and schedule an appointment for further refills 1st attempt 04/22/18   Wendall Stade, MD  pregabalin (LYRICA) 100 MG capsule Take 100 mg by mouth 3 (three) times daily.    [provider]  rivaroxaban (XARELTO) 20 MG TABS tablet Take 20 mg by mouth once a day with supper Patient taking differently: Take 20 mg by mouth daily with supper.  08/11/17   Wendall Stade, MD  tamsulosin (FLOMAX) 0.4 MG CAPS capsule Take 0.4 mg by mouth at bedtime. 04/16/17   [provider]   vitamin C (ASCORBIC ACID) 500 MG tablet Take 500 mg by mouth 2 (two) times daily.    [provider]  VOLTAREN 1 % GEL APPLY 2 INCHES TO AFFECTED AREA FOUR TIMES A DAY Patient taking differently: APPLY 2 INCHES  TO AFFECTED AREA THREE TIMES A DAY 01/29/17   Mikell, Antionette Poles, MD    Family History Family History  Problem Relation Age of Onset  . Heart attack Mother        @ age 70  . Mental illness Mother   . Diabetes Mother   . Hypertension Mother        siblings  . Alzheimer's disease Mother   . Depression Mother   . Hyperlipidemia Mother   . Heart attack Brother 50  . Alcohol abuse Brother   . Depression Brother   . Diabetes Brother   . Hyperlipidemia Brother   . Hypertension Brother   . Kidney disease Brother   . Drug abuse Brother   . Alcohol abuse Father   . Heart attack Father   . Hyperlipidemia Father   . Hypertension Father   . Colon cancer Maternal Aunt   . Prostate cancer Maternal Grandfather   . Diabetes Maternal Grandfather   . Hyperlipidemia Maternal Grandfather   . Ovarian cancer Maternal Aunt   . Diabetes Unknown   . Hypertension Unknown   . Lupus Sister   . Alcohol abuse Sister   . Depression Sister   . Diabetes Sister   . Hyperlipidemia Sister   . Hypertension Sister   . Kidney disease Sister   . Drug abuse Sister   . Ovarian cancer Cousin   . Diabetes Maternal Grandmother   . Hyperlipidemia Maternal Grandmother   . Breast cancer Neg Hx     Social History Social History   Tobacco Use  . Smoking status: Never Smoker  . Smokeless tobacco: Never Used  Substance Use Topics  . Alcohol use: No  . Drug use: No     Allergies   Prednisone; Amitriptyline; Hydromorphone hcl; and Lisinopril   Review of Systems Review of Systems   Physical Exam Triage Vital Signs ED Triage Vitals  Enc Vitals Group     BP 04/25/18 1200 137/75     Pulse Rate 04/25/18 1159 76     Resp 04/25/18 1159 18     Temp 04/25/18 1159 97.8 F (36.6 C)      Temp src --      SpO2 04/25/18 1159 100 %     Weight --      Height --      Head Circumference --      Peak Flow --      Pain Score 04/25/18 1200 7     Pain Loc --      Pain Edu? --      Excl. in GC? --    No data found.  Updated Vital Signs BP 137/75   Pulse 76   Temp 97.8 F (36.6 C)   Resp 18   SpO2 100%    Physical Exam  Constitutional: She is oriented to person, place, and time. She appears well-developed and well-nourished. No distress.  HENT:  Head: Normocephalic and atraumatic.  Right Ear: Tympanic membrane, external ear and ear canal normal.  Left Ear: Tympanic membrane, external ear and ear canal normal.  Nose: Right sinus exhibits maxillary sinus tenderness and frontal sinus tenderness. Left sinus exhibits maxillary sinus tenderness and frontal sinus tenderness.  Mouth/Throat: Uvula is midline, oropharynx is clear and moist and mucous membranes are normal. No tonsillar exudate.  Eyes: Pupils are equal, round, and reactive to light. Conjunctivae and EOM are normal.  Cardiovascular: Normal rate and normal heart sounds.  Pulmonary/Chest: Effort normal and breath sounds normal.  Neurological: She is alert and oriented to person, place, and time.  Skin: Skin is warm and dry.     UC Treatments / Results  Labs (all labs ordered are listed, but only abnormal results are displayed) Labs Reviewed - No data to display  EKG None  Radiology No results found.  Procedures Procedures (including critical care time)  Medications Ordered in UC Medications - No data to display  Initial Impression / Assessment and Plan / UC Course  I have reviewed the triage vital signs and the nursing notes.  Pertinent labs & imaging results that were available during my care of the patient were reviewed by me and considered in my medical decision making (see chart for details).     Persistent and worsening of sinus symptoms for greater than a week with otalgia. Continue with  supportive cares. Course of augmentin provided. If symptoms worsen or do not improve in the next week to return to be seen or to follow up with PCP.  Patient verbalized understanding and agreeable to plan.    Final Clinical Impressions(s) / UC Diagnoses   Final diagnoses:  Acute maxillary sinusitis, recurrence not specified     Discharge Instructions     Push fluids to ensure adequate hydration and keep secretions thin.  Continue with allergy medications and daily nasal spray.  Complete course of antibiotics.  If symptoms worsen or do not improve in the next week to return to be seen or to follow up with your PCP.     ED Prescriptions    Medication Sig Dispense Auth. Provider   amoxicillin-clavulanate (AUGMENTIN) 875-125 MG tablet Take 1 tablet by mouth every 12 (twelve) hours for 10 days. 20 tablet Georgetta Haber, NP     Controlled Substance Prescriptions [place] Controlled Substance Registry consulted? Not Applicable   Georgetta Haber, NP 04/25/18 1326

## 2018-05-10 ENCOUNTER — Encounter: Payer: Self-pay | Admitting: Internal Medicine

## 2018-05-22 ENCOUNTER — Other Ambulatory Visit: Payer: Self-pay | Admitting: Internal Medicine

## 2018-05-22 ENCOUNTER — Other Ambulatory Visit: Payer: Self-pay | Admitting: Cardiovascular Disease

## 2018-05-22 DIAGNOSIS — R110 Nausea: Secondary | ICD-10-CM

## 2018-05-24 ENCOUNTER — Other Ambulatory Visit: Payer: Self-pay | Admitting: Student in an Organized Health Care Education/Training Program

## 2018-05-24 ENCOUNTER — Other Ambulatory Visit: Payer: Self-pay | Admitting: Family Medicine

## 2018-05-24 DIAGNOSIS — Z1231 Encounter for screening mammogram for malignant neoplasm of breast: Secondary | ICD-10-CM

## 2018-05-25 ENCOUNTER — Ambulatory Visit: Payer: Medicare Other | Admitting: Internal Medicine

## 2018-05-26 ENCOUNTER — Telehealth: Payer: Self-pay | Admitting: *Deleted

## 2018-05-26 NOTE — Telephone Encounter (Signed)
Pt needs a letter faxed to transportation @ [phone] stating her BP drops and she needs someone to ride with her to the appts.  Advised that I would send a message to the MD. Kolden Dupee, Maryjo Rochester, CMA

## 2018-05-27 ENCOUNTER — Encounter: Payer: Self-pay | Admitting: Student in an Organized Health Care Education/Training Program

## 2018-05-27 NOTE — Treatment Plan (Signed)
     Please fax to transportation: [phone]  To whom it may concern:   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 It would be beneficial and recommended that she have a companion while traveling to her medical appointments. Thank you for your cooperation.    Jamelle Rushing, DO [facility] PGY-1

## 2018-05-31 NOTE — Telephone Encounter (Signed)
Letter created and placed in to be faxed pile. Fleeger, Maryjo Rochester, CMA

## 2018-06-01 ENCOUNTER — Other Ambulatory Visit: Payer: Self-pay

## 2018-06-01 ENCOUNTER — Ambulatory Visit (INDEPENDENT_AMBULATORY_CARE_PROVIDER_SITE_OTHER): Payer: Medicare Other | Admitting: Family Medicine

## 2018-06-01 ENCOUNTER — Ambulatory Visit (HOSPITAL_COMMUNITY)
Admit: 2018-06-01 | Discharge: 2018-06-01 | Disposition: A | Discharge status: Home / Self Care | Payer: Medicare Other | Attending: Family Medicine | Admitting: Family Medicine

## 2018-06-01 ENCOUNTER — Emergency Department (HOSPITAL_COMMUNITY): Payer: Medicare Other

## 2018-06-01 ENCOUNTER — Encounter: Payer: Self-pay | Admitting: Family Medicine

## 2018-06-01 ENCOUNTER — Encounter (HOSPITAL_COMMUNITY): Payer: Self-pay | Admitting: Emergency Medicine

## 2018-06-01 ENCOUNTER — Observation Stay (HOSPITAL_COMMUNITY)
Admission: EM | Admit: 2018-06-01 | Discharge: 2018-06-03 | Disposition: A | Discharge status: Home / Self Care | Payer: Medicare Other | Attending: Family Medicine | Admitting: Family Medicine

## 2018-06-01 VITALS — BP 124/62 | HR 88 | Temp 97.8°F | Wt 213.0 lb

## 2018-06-01 DIAGNOSIS — R531 Weakness: Secondary | ICD-10-CM | POA: Diagnosis not present

## 2018-06-01 DIAGNOSIS — R079 Chest pain, unspecified: Secondary | ICD-10-CM | POA: Diagnosis not present

## 2018-06-01 DIAGNOSIS — G40909 Epilepsy, unspecified, not intractable, without status epilepticus: Secondary | ICD-10-CM | POA: Insufficient documentation

## 2018-06-01 DIAGNOSIS — Z7901 Long term (current) use of anticoagulants: Secondary | ICD-10-CM | POA: Insufficient documentation

## 2018-06-01 DIAGNOSIS — M549 Dorsalgia, unspecified: Secondary | ICD-10-CM | POA: Insufficient documentation

## 2018-06-01 DIAGNOSIS — Z6834 Body mass index (BMI) 34.0-34.9, adult: Secondary | ICD-10-CM | POA: Insufficient documentation

## 2018-06-01 DIAGNOSIS — J45909 Unspecified asthma, uncomplicated: Secondary | ICD-10-CM | POA: Insufficient documentation

## 2018-06-01 DIAGNOSIS — N179 Acute kidney failure, unspecified: Secondary | ICD-10-CM | POA: Diagnosis not present

## 2018-06-01 DIAGNOSIS — I129 Hypertensive chronic kidney disease with stage 1 through stage 4 chronic kidney disease, or unspecified chronic kidney disease: Secondary | ICD-10-CM | POA: Insufficient documentation

## 2018-06-01 DIAGNOSIS — R05 Cough: Secondary | ICD-10-CM | POA: Insufficient documentation

## 2018-06-01 DIAGNOSIS — R42 Dizziness and giddiness: Secondary | ICD-10-CM | POA: Insufficient documentation

## 2018-06-01 DIAGNOSIS — F419 Anxiety disorder, unspecified: Secondary | ICD-10-CM | POA: Insufficient documentation

## 2018-06-01 DIAGNOSIS — R0789 Other chest pain: Secondary | ICD-10-CM | POA: Insufficient documentation

## 2018-06-01 DIAGNOSIS — D869 Sarcoidosis, unspecified: Secondary | ICD-10-CM | POA: Diagnosis not present

## 2018-06-01 DIAGNOSIS — J3489 Other specified disorders of nose and nasal sinuses: Secondary | ICD-10-CM | POA: Diagnosis not present

## 2018-06-01 DIAGNOSIS — I309 Acute pericarditis, unspecified: Secondary | ICD-10-CM

## 2018-06-01 DIAGNOSIS — Z794 Long term (current) use of insulin: Secondary | ICD-10-CM | POA: Insufficient documentation

## 2018-06-01 DIAGNOSIS — E1122 Type 2 diabetes mellitus with diabetic chronic kidney disease: Secondary | ICD-10-CM | POA: Insufficient documentation

## 2018-06-01 DIAGNOSIS — I480 Paroxysmal atrial fibrillation: Secondary | ICD-10-CM | POA: Diagnosis not present

## 2018-06-01 DIAGNOSIS — E785 Hyperlipidemia, unspecified: Secondary | ICD-10-CM | POA: Insufficient documentation

## 2018-06-01 DIAGNOSIS — M25512 Pain in left shoulder: Secondary | ICD-10-CM

## 2018-06-01 DIAGNOSIS — Z79899 Other long term (current) drug therapy: Secondary | ICD-10-CM | POA: Insufficient documentation

## 2018-06-01 DIAGNOSIS — E1142 Type 2 diabetes mellitus with diabetic polyneuropathy: Secondary | ICD-10-CM | POA: Diagnosis not present

## 2018-06-01 DIAGNOSIS — N183 Chronic kidney disease, stage 3 (moderate): Secondary | ICD-10-CM | POA: Insufficient documentation

## 2018-06-01 DIAGNOSIS — F319 Bipolar disorder, unspecified: Secondary | ICD-10-CM | POA: Insufficient documentation

## 2018-06-01 DIAGNOSIS — J449 Chronic obstructive pulmonary disease, unspecified: Secondary | ICD-10-CM | POA: Diagnosis not present

## 2018-06-01 DIAGNOSIS — M797 Fibromyalgia: Secondary | ICD-10-CM | POA: Insufficient documentation

## 2018-06-01 DIAGNOSIS — R110 Nausea: Secondary | ICD-10-CM | POA: Insufficient documentation

## 2018-06-01 DIAGNOSIS — I951 Orthostatic hypotension: Secondary | ICD-10-CM | POA: Diagnosis not present

## 2018-06-01 DIAGNOSIS — K219 Gastro-esophageal reflux disease without esophagitis: Secondary | ICD-10-CM | POA: Diagnosis not present

## 2018-06-01 DIAGNOSIS — Z888 Allergy status to other drugs, medicaments and biological substances status: Secondary | ICD-10-CM | POA: Insufficient documentation

## 2018-06-01 LAB — CBC
HEMATOCRIT: 41.7 % (ref 36.0–46.0)
HEMOGLOBIN: 13.2 g/dL (ref 12.0–15.0)
MCH: 29.1 pg (ref 26.0–34.0)
MCHC: 31.7 g/dL (ref 30.0–36.0)
MCV: 92.1 fL (ref 80.0–100.0)
NRBC: 0 % (ref 0.0–0.2)
Platelets: 197 10*3/uL (ref 150–400)
RBC: 4.53 MIL/uL (ref 3.87–5.11)
RDW: 12.9 % (ref 11.5–15.5)
WBC: 5 10*3/uL (ref 4.0–10.5)

## 2018-06-01 LAB — BASIC METABOLIC PANEL
Anion gap: 12 (ref 5–15)
BUN: 21 mg/dL (ref 8–23)
CHLORIDE: 102 mmol/L (ref 98–111)
CO2: 19 mmol/L — AB (ref 22–32)
Calcium: 9.2 mg/dL (ref 8.9–10.3)
Creatinine, Ser: 1.84 mg/dL — ABNORMAL HIGH (ref 0.44–1.00)
GFR calc Af Amer: 32 mL/min — ABNORMAL LOW (ref >60)
GFR calc non Af Amer: 27 mL/min — ABNORMAL LOW (ref >60)
Glucose, Bld: 203 mg/dL — ABNORMAL HIGH (ref 70–99)
POTASSIUM: 4.9 mmol/L (ref 3.5–5.1)
Sodium: 133 mmol/L — ABNORMAL LOW (ref 135–145)

## 2018-06-01 LAB — GLUCOSE, CAPILLARY: Glucose-Capillary: 140 mg/dL — ABNORMAL HIGH (ref 70–99)

## 2018-06-01 LAB — I-STAT TROPONIN, ED: Troponin i, poc: 0 ng/mL (ref 0.00–0.08)

## 2018-06-01 LAB — D-DIMER, QUANTITATIVE (NOT AT ARMC)

## 2018-06-01 MED ORDER — OXYCODONE HCL 5 MG PO TABS
5.0000 mg | ORAL_TABLET | Freq: Every day | ORAL | Status: DC
  Administered 2018-06-02 – 2018-06-03 (×8): 5 mg via ORAL
  Filled 2018-06-01 (×8): qty 1

## 2018-06-01 MED ORDER — NITROGLYCERIN 0.4 MG SL SUBL
0.4000 mg | SUBLINGUAL_TABLET | SUBLINGUAL | Status: DC | PRN
  Administered 2018-06-01 (×2): 0.4 mg via SUBLINGUAL
  Filled 2018-06-01: qty 1

## 2018-06-01 MED ORDER — ONDANSETRON HCL 4 MG/2ML IJ SOLN
4.0000 mg | Freq: Once | INTRAMUSCULAR | Status: AC
  Administered 2018-06-01: 4 mg via INTRAVENOUS
  Filled 2018-06-01: qty 2

## 2018-06-01 MED ORDER — PANTOPRAZOLE SODIUM 40 MG PO TBEC
40.0000 mg | DELAYED_RELEASE_TABLET | Freq: Every day | ORAL | Status: DC
  Administered 2018-06-01 – 2018-06-03 (×3): 40 mg via ORAL
  Filled 2018-06-01 (×3): qty 1

## 2018-06-01 MED ORDER — AMLODIPINE BESYLATE 10 MG PO TABS
10.0000 mg | ORAL_TABLET | Freq: Every day | ORAL | Status: DC
  Administered 2018-06-01 – 2018-06-03 (×3): 10 mg via ORAL
  Filled 2018-06-01 (×3): qty 1

## 2018-06-01 MED ORDER — RIVAROXABAN 15 MG PO TABS
15.0000 mg | ORAL_TABLET | Freq: Every day | ORAL | Status: DC
  Administered 2018-06-01 – 2018-06-02 (×2): 15 mg via ORAL
  Filled 2018-06-01 (×2): qty 1

## 2018-06-01 MED ORDER — CALCIUM CARBONATE 1250 (500 CA) MG PO TABS
1250.0000 mg | ORAL_TABLET | Freq: Every day | ORAL | Status: DC
  Administered 2018-06-02 – 2018-06-03 (×2): 1250 mg via ORAL
  Filled 2018-06-01 (×2): qty 1

## 2018-06-01 MED ORDER — INSULIN ASPART 100 UNIT/ML ~~LOC~~ SOLN: 0.0000 [IU] | Freq: Every day | SUBCUTANEOUS | Status: DC

## 2018-06-01 MED ORDER — OXYCODONE-ACETAMINOPHEN 5-325 MG PO TABS
1.0000 | ORAL_TABLET | Freq: Once | ORAL | Status: AC
  Administered 2018-06-01: 1 via ORAL
  Filled 2018-06-01: qty 1

## 2018-06-01 MED ORDER — ONDANSETRON HCL 4 MG/2ML IJ SOLN
4.0000 mg | Freq: Four times a day (QID) | INTRAMUSCULAR | Status: DC | PRN
  Administered 2018-06-03: 4 mg via INTRAVENOUS
  Filled 2018-06-01: qty 2

## 2018-06-01 MED ORDER — MORPHINE SULFATE (PF) 4 MG/ML IV SOLN
4.0000 mg | Freq: Once | INTRAVENOUS | Status: AC
  Administered 2018-06-01: 4 mg via INTRAVENOUS
  Filled 2018-06-01: qty 1

## 2018-06-01 MED ORDER — SODIUM CHLORIDE 0.9 % IV BOLUS
1000.0000 mL | Freq: Once | INTRAVENOUS | Status: AC
  Administered 2018-06-01: 1000 mL via INTRAVENOUS

## 2018-06-01 MED ORDER — OXYCODONE-ACETAMINOPHEN 5-325 MG PO TABS
1.0000 | ORAL_TABLET | Freq: Every day | ORAL | Status: DC
  Administered 2018-06-02 – 2018-06-03 (×8): 1 via ORAL
  Filled 2018-06-01 (×8): qty 1

## 2018-06-01 MED ORDER — PREGABALIN 75 MG PO CAPS
100.0000 mg | ORAL_CAPSULE | Freq: Three times a day (TID) | ORAL | Status: DC
  Administered 2018-06-01 – 2018-06-03 (×5): 100 mg via ORAL
  Filled 2018-06-01 (×5): qty 1

## 2018-06-01 MED ORDER — NITROGLYCERIN 0.4 MG SL SUBL
0.4000 mg | SUBLINGUAL_TABLET | Freq: Once | SUBLINGUAL | Status: AC
  Administered 2018-06-01: 0.4 mg via SUBLINGUAL

## 2018-06-01 MED ORDER — AMIODARONE HCL 100 MG PO TABS
100.0000 mg | ORAL_TABLET | Freq: Every day | ORAL | Status: DC
  Administered 2018-06-01 – 2018-06-03 (×3): 100 mg via ORAL
  Filled 2018-06-01 (×3): qty 1

## 2018-06-01 MED ORDER — VITAMIN D 1000 UNITS PO TABS
1000.0000 [IU] | ORAL_TABLET | Freq: Two times a day (BID) | ORAL | Status: DC
  Administered 2018-06-01 – 2018-06-03 (×4): 1000 [IU] via ORAL
  Filled 2018-06-01 (×4): qty 1

## 2018-06-01 MED ORDER — OXYCODONE HCL 5 MG PO TABS
5.0000 mg | ORAL_TABLET | Freq: Once | ORAL | Status: AC
  Administered 2018-06-01: 5 mg via ORAL
  Filled 2018-06-01: qty 1

## 2018-06-01 MED ORDER — DULOXETINE HCL 60 MG PO CPEP
60.0000 mg | ORAL_CAPSULE | Freq: Every day | ORAL | Status: DC
  Administered 2018-06-01 – 2018-06-02 (×2): 60 mg via ORAL
  Filled 2018-06-01 (×2): qty 1

## 2018-06-01 MED ORDER — INSULIN ASPART 100 UNIT/ML ~~LOC~~ SOLN
0.0000 [IU] | Freq: Three times a day (TID) | SUBCUTANEOUS | Status: DC
  Administered 2018-06-02 (×2): 5 [IU] via SUBCUTANEOUS
  Administered 2018-06-02 – 2018-06-03 (×3): 3 [IU] via SUBCUTANEOUS
  Filled 2018-06-01 (×5): qty 1

## 2018-06-01 MED ORDER — ALBUTEROL SULFATE (2.5 MG/3ML) 0.083% IN NEBU: 2.5000 mg | INHALATION_SOLUTION | RESPIRATORY_TRACT | Status: DC | PRN

## 2018-06-01 MED ORDER — OXYCODONE-ACETAMINOPHEN 10-325 MG PO TABS: 1.0000 | ORAL_TABLET | Freq: Every day | ORAL | Status: DC

## 2018-06-01 MED ORDER — ASPIRIN 325 MG PO TABS
325.0000 mg | ORAL_TABLET | Freq: Once | ORAL | Status: AC
  Administered 2018-06-01: 325 mg via ORAL

## 2018-06-01 MED ORDER — MOMETASONE FURO-FORMOTEROL FUM 200-5 MCG/ACT IN AERO
2.0000 | INHALATION_SPRAY | Freq: Two times a day (BID) | RESPIRATORY_TRACT | Status: DC
  Administered 2018-06-01 – 2018-06-03 (×4): 2 via RESPIRATORY_TRACT
  Filled 2018-06-01: qty 8.8

## 2018-06-01 MED ORDER — INSULIN GLARGINE 100 UNIT/ML ~~LOC~~ SOLN
30.0000 [IU] | Freq: Every day | SUBCUTANEOUS | Status: DC
  Administered 2018-06-01 – 2018-06-02 (×2): 30 [IU] via SUBCUTANEOUS
  Filled 2018-06-01 (×3): qty 0.3

## 2018-06-01 MED ORDER — ATORVASTATIN CALCIUM 80 MG PO TABS
80.0000 mg | ORAL_TABLET | Freq: Every day | ORAL | Status: DC
  Administered 2018-06-01 – 2018-06-02 (×2): 80 mg via ORAL
  Filled 2018-06-01 (×2): qty 1

## 2018-06-01 NOTE — Progress Notes (Signed)
    Subjective:    Patient ID: [name], female    DOB: 01-22-1951, 67 y.o.   MRN: [id number]   CC: sinus infection  Had URI and was seen here, sent home and told it was a virus. She felt like she was worsening so she went to UC. At UC she got a 10 day course of amox and finished this about 2 weeks ago. Since then started feeling badly again gradually. No fevers but feels chills. Productive cough- green sputum- no sore throat, has headache and jaw pain. Congestion. No sick contacts  Last night was short of breath and had chest pain. She checked her pulse and PO2. Her sats were in the 70s. She took a nitro with relief of chest pain. She tried deep breathing to improve sats and fell asleep. She called to make appointment this morning to be seen.  She is currently endorsing chest pain in the office. She describes it as a pressure in her central-left sided chest.   Objective:  BP 124/62   Pulse 88   Temp 97.8 F (36.6 C) (Oral)   Wt 213 lb (96.6 kg)   SpO2 98%   BMI 33.36 kg/m  Vitals and nursing note reviewed  General: in mild distress, mildly diaphoretic  HEENT: normocephalic, no scleral icterus or conjunctival pallor, no nasal discharge, moist mucous membranes, good dentition without erythema or discharge noted in posterior oropharynx Neck: supple, non-tender, without lymphadenopathy Cardiac: RRR, clear S1 and S2, no murmurs, rubs, or gallops Respiratory: clear to auscultation bilaterally, no increased work of breathing Abdomen: soft, nontender, nondistended Neuro: alert and oriented, no focal deficits   Assessment & Plan:    1. Chest pain, unspecified type Patient was experiencing central chest pressure and pain in the office. EKG was obtained which revealed rate of 90 and prolonged QT. No ST changes seen on EKG. She has a history of afib s/p ablation in April. She takes xarelto daily. She was given 325 asa and nitro in office. EMS transported patient to the ED. -  aspirin tablet 325 mg - nitroGLYCERIN (NITROSTAT) SL tablet 0.4 mg  2. Sinus pressure After brief HEENT exam it does not appear patient has clinical signs of sinus infection. Will defer further assessment to ED.   Sent patient to ED via EMS for further evaluation   Dolores Patty, DO Family Medicine Resident PGY-3

## 2018-06-01 NOTE — H&P (Addendum)
Family Medicine Teaching [facility] Admission History and Physical Service Pager: [phone]  Patient name: [name] Medical record number: [id number] Date of birth: [birth year] Age: 67 y.o. Gender: female  Primary Care Provider: Leeroy Bock, DO Consultants: None Code Status: Full  Chief Complaint: "Chest hurts"  Assessment and Plan: [name] is a 67 y.o. female presenting with atypical chest pain. PMH is significant for IDT2DM, HDL, HTN, Asthma, GERD, A-fib s/p ablation (02/2018), Sarcoidosis, CKD3.  Atypical Chest Pain Acute. Atypical given constant duration not associated with exertion. HEART score 4. Did receive aspirin in clinic earlier today with reassuring EKG. History significant for multiple risk factors. EKG NSR without signs of ST elevations and troponin neg x 1 in ED.  Echo in April 2019 significant for EF 60-65%. History of sinus infection 3 weeks ago, treated with 10 days of Amoxicillin with lingering cough and congestion. Has a documented chronic cough associated with sarcoidosis. Complains of productive cough. CXR negative. No leukocytosis, lungs clear on exam, and patient has been afebrile, so chest pain unlikely 2/2 to pneumonia or pulmonary effusion. No wheeze or hypoxia making asthma exacerbation less likely. BP stable, on Xarelto chronically, D-dimer WNL, and Wells score 0 so doubt PE. Dissection unlikely as patient is hemodynamically stable with equal pulse in UE, normotensive without mediastinal widening on CXR. Could be MSK 2/2 to chronic cough and tenderness to palpation along chest wall. TSH WNL in April 2019. Will observe overnight and trend troponins with repeat labs and EKG in the AM. Consider further workup pending results.  - Admit to Telemetry, attending Dr. Jennette Kettle - Trend troponins every 6 hours x3 - Repeat EKG in AM - Continuous pulse ox with supplemental oxygen as needed - Checking TSH - Zofran PRN - Carb modified diet - K-pad - Home  oxy-Acetamenaphen 10-325mg  for pain - Continue home Lipitor 80 mg daily  Acute kidney injury  CKDIIIa Cr on admission 1.84 (BL 1.0-1.3). Fluid bolus given in ED. Likely 2/2 to dehydration. Will continue to monitor s/p fluids.  - AM BMP - Avoid nephrotoxic agents, holding home irbesartan   History of atrial-fibrillation s/p Ablation - stable Ablation in October and April 2019. Stable without recurrence. On home Xarelto and Amiodarone 100mg . Of note, EKG on admission noted prolonged QTc of 496, which appears to be new since July. Will continue to monitor with repeat ECG in AM. Will continue Amiodarone at this time for rate control but avoid other medications that prolong QT for now. - continue home Xarelto and Amiodarone - Repeat ECG in Am - Continue to monitor closely - Avoid meds that prolong QT  Insulin-dependant Diabetes Mellitus type 2  HgbA1C (02/14/18) 8.9 down from 9.3 in April 2019. On home Humolog SSI and Lantus 60 U (though med chart reflects 50 U daily) - Begin Lantus 30U - Begin SSI  - Continue to montior CBG - Follow-up Hgb A1c  - Carb modified diet  Chronic Low Back Pain - requiring opioids  On home Oxycodone-Acetamenaphen 10-325mg  1 po 5 times per day.   HTN: stable On home Amlodipine and Irbesartan - Continue Amlodipine 10mg  - Hold Irbesartan due to AKI - Continue to monitor - Consider restarting Irbesartan at discharge or upon resolution of AKI  HLD: chronic - Continue home Atorvastatin 80mg   Asthma with COPD overlap - stable Lungs clear on exam. - Continue Albuterol q2 PRN - Continue home Dulera  IBS with Diarrhea: chronic Has had recent watery diarrhea. Will hold home Lomotil at  this time. Consider adding if symptomatic. Not currently on Imodium. - Hold home Lomotil  Peripheral Neuropathy: Chronic. Stable. - Continue Cymbalta 60mg  and Lyrica 100mg    GERD On home Nexium. Atypical chest pain not c/w GERD per patient on admission. - Begin Protonix    History of orthostatic hypotension On home tamsulosin.  - Hold tamsulosin   Sarcoidosis: stable Dx'd based on x-ray findings of lymphadenopathy, negative transbronchial bx. Has a chronic cough. Followed by Dr. Isaiah Serge. -Continue to monitor  Morbid obesity BMI 34.38 -Recommend discussion of life-style modifications with PCP as outpatient  FEN/GI: Carb modified diet, Protonix, Replace electrolytes PRN Prophylaxis: Xarelto  Disposition: Home pending workup  History of Present Illness:  [name] is a 67 y.o. female presenting with epigastric and lower left sided chest pain that radiates to the left arm that awoke her from sleep at 3am last night. She notes it feels like someone "punched her in the chest". She has also experienced some bilateral jaw pain x 2 days. She took a dose of nitroglycerine which helped with the chest pain. She also endorses shortness of breath during the episode of chest pain so she checked her oxygen saturations which fluctuated between 79-90%. She took a dose of albuterol and some deep breaths and her O2 sats improved. She was able to fall back asleep last night.   Patient has had a recent history of a sinus infection 3 weeks ago, treated with 10 day course of Amoxicillin with some improvement in symptoms. She complains of persistent congestion and cough productive of "white pussy phlegm". She denies any fevers or hemoptysis but has had some nausea, chills and cold sweats over the last several days. She has a history of GERD but notes that this feels different. She notes the chest pain is worse when she lies flat on her back but does endorse chronic low back pain that is painful when lying on her back.   Patient had an appointment at [facility] this morning with Dr. Wonda Olds. She noted recurrence of chest pain that was similar to last night. She was given ASA 325 and a dose of nitro and instructed to come to the ED. In the ED patient was given another dose of  nitroglycerin and morphine without resolution of her chest pain. She admits to new onset headache since the nitro. Upon admission, vitals were stable with normotensive BP, O2 sats of 97%, and afebrile. Initial work up included normal EKG without signs of ST elevation and troponin neg x 1. Her CBC WNL and CMP significant for elevated creatinine of 1.84. Given multiple co-morbidities, recent clinic visit with persistent chest pain not consistent with patient prior chest pain, and new AKI, decision was made to admit for observation for ACS work-up.  Review Of Systems: See HPI.  Review of Systems  Constitutional: Positive for chills, diaphoresis and malaise/fatigue. Negative for fever.  HENT: Positive for congestion and sinus pain.   Eyes: Negative for double vision and pain.  Respiratory: Positive for cough, sputum production and shortness of breath. Negative for hemoptysis and wheezing.   Cardiovascular: Positive for chest pain, palpitations and orthopnea. Negative for leg swelling.  Gastrointestinal: Positive for nausea. Negative for abdominal pain, blood in stool, diarrhea and vomiting.  Genitourinary: Negative for dysuria and urgency.  Musculoskeletal: Positive for back pain and myalgias.  Neurological: Positive for headaches. Negative for weakness.  Psychiatric/Behavioral: Negative for substance abuse. The patient is nervous/anxious.     Patient Active Problem List  Diagnosis Date Noted  . Atypical chest pain 06/01/2018  . Seasonal allergies 04/22/2018  . Nausea without vomiting 01/26/2018  . Cough due to ACE inhibitor 12/21/2017  . Gait abnormality 11/30/2017  . Cardiac syncope 11/22/2017  . Bipolar disorder (HCC)   . Chronic back pain   . CKD (chronic kidney disease), stage III (HCC)   . Depression   . Anxiety   . Estrogen deficiency 06/07/2017  . Wellness examination 06/03/2017  . A-fib (HCC) 05/05/2017  . Back pain 12/11/2016  . Pelvic pain 11/25/2016  . Chronic pain  09/08/2016  . Spells of decreased attentiveness 09/08/2016  . Irritable bowel syndrome 03/03/2016  . Chronic leukopenia 09/13/2015  . PAF (paroxysmal atrial fibrillation) (HCC) 08/22/2015  . Morbid obesity (HCC) 07/11/2015  . Bipolar I disorder (HCC) 05/22/2015  . Seizure disorder (HCC)   . History of diverticulitis   . Syncope 06/29/2012  . Orthostatic hypotension 06/29/2012  . History of colonic polyps 09/01/2011  . Sarcoid (HCC) 09/01/2011  . Asthma 09/01/2011  . GERD (gastroesophageal reflux disease) 09/01/2011  . Diabetes mellitus type 2, uncontrolled, with complications (HCC) 11/19/2008  . Hyperlipidemia 11/19/2008  . Essential hypertension 11/19/2008    Past Medical History: Past Medical History:  Diagnosis Date  . Anxiety   . Arthritis    "knees, hands, ankles, feet" (05/12/2016)  . Asthma    Dr. [name]  . Atrial fibrillation (HCC)   . Bipolar disorder (HCC)   . Chronic back pain    "lower and middle" (05/12/2016)  . Chronic kidney disease (CKD), stage III (moderate) (HCC) 05/14/2015  . CKD (chronic kidney disease), stage III (HCC)   . Colon polyps   . Depression   . Diverticulosis   . Epilepsy (HCC)   . Essential hypertension   . Fibromyalgia   . Gallstones   . GERD (gastroesophageal reflux disease)   . H/O hiatal hernia   . Hyperlipidemia   . IBS (irritable bowel syndrome)   . Insulin dependent diabetes mellitus (HCC)   . Paroxysmal A-fib (HCC)    failed medical therapy with tikosyn, s/p AF ablation x 2  . Paroxysmal A-fib (HCC)    a. On [name]. b. Recurrence 08/2014 in setting of GI illness.  . Paroxysmal atrial fibrillation (HCC) 05/18/2017  . Peripheral neuropathy   . Persistent atrial fibrillation   . Sarcoidosis of lung (HCC)    Dr. [name]  . Seizures (HCC)    "epileptic; pretty regular recently" (05/12/2016)  . Syncope   . Type II diabetes mellitus (HCC)   . Type II diabetes mellitus (HCC)     Past Surgical History: Past Surgical History:   Procedure Laterality Date  . ABDOMINAL HYSTERECTOMY  1995  . ANTERIOR CERVICAL DECOMP/DISCECTOMY FUSION  07/11/2012   Procedure: ANTERIOR CERVICAL DECOMPRESSION/DISCECTOMY FUSION 1 LEVEL;  Surgeon: [name], MD;  Location: [facility] NEURO ORS;  Service: Neurosurgery;  Laterality: N/A;  Cervical Five-Six Anterior Cervical Decompression with Fusion Interbody Prothesis Plating and Bonegraft  . ATRIAL FIBRILLATION ABLATION N/A 05/18/2017   Procedure: Atrial Fibrillation Ablation;  Surgeon: [name], MD;  Location: [facility] INVASIVE CV LAB;  Service: Cardiovascular;  Laterality: N/A;  . ATRIAL FIBRILLATION ABLATION N/A 11/16/2017   Procedure: ATRIAL FIBRILLATION ABLATION;  Surgeon: [name], MD;  Location: [facility] INVASIVE CV LAB;  Service: Cardiovascular;  Laterality: N/A;  . CARDIAC CATHETERIZATION  2012   a. Normal coronaries 2012. b. Normal nuc 09/2014.  Marland Kitchen CARDIAC CATHETERIZATION  05/18/2017  . CHOLECYSTECTOMY OPEN  1976  .  CLOSED REDUCTION ANKLE FRACTURE Left 10/2006   "got steel rod in my leg; and screws"  . COLON SURGERY    . DILATION AND CURETTAGE OF UTERUS    . ESOPHAGEAL MANOMETRY  03/21/2012   Procedure: ESOPHAGEAL MANOMETRY (EM);  Surgeon: [name], MD;  Location: [facility] ENDOSCOPY;  Service: Endoscopy;  Laterality: N/A;  . FRACTURE SURGERY    . NEUROPLASTY / TRANSPOSITION MEDIAN NERVE AT CARPAL TUNNEL Left 2004  . NEUROPLASTY / TRANSPOSITION MEDIAN NERVE AT CARPAL TUNNEL Right 2002  . 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 SALPINGOOPHORECTOMY Bilateral 2000  . TEE WITHOUT CARDIOVERSION N/A 05/18/2017   Procedure: TRANSESOPHAGEAL ECHOCARDIOGRAM (TEE);  Surgeon: [name], MD;  Location: [facility] ENDOSCOPY;  Service: Cardiovascular;  Laterality: N/A;  . TUBAL LIGATION  1982    Social History: Social History   Tobacco Use  . Smoking status: Never Smoker  . Smokeless tobacco: Never Used  Substance Use Topics  . Alcohol use: No  . Drug use: No   Additional social history:  lives at home with friend. Disabled. Uses walker and cane "when she feels sick." Denies illicit drug use or EtOH use. Never smoker.  Family History: Family History  Problem Relation Age of Onset  . Heart attack Mother        @ age 51  . Mental illness Mother   . Diabetes Mother   . Hypertension Mother        siblings  . Alzheimer's disease Mother   . Depression Mother   . Hyperlipidemia Mother   . Heart attack Brother 50  . Alcohol abuse Brother   . Depression Brother   . Diabetes Brother   . Hyperlipidemia Brother   . Hypertension Brother   . Kidney disease Brother   . Drug abuse Brother   . Alcohol abuse Father   . Heart attack Father   . Hyperlipidemia Father   . Hypertension Father   . Colon cancer Maternal Aunt   . Prostate cancer Maternal Grandfather   . Diabetes Maternal Grandfather   . Hyperlipidemia Maternal Grandfather   . Ovarian cancer Maternal Aunt   . Diabetes Unknown   . Hypertension Unknown   . Lupus Sister   . Alcohol abuse Sister   . Depression Sister   . Diabetes Sister   . Hyperlipidemia Sister   . Hypertension Sister   . Kidney disease Sister   . Drug abuse Sister   . Ovarian cancer Cousin   . Diabetes Maternal Grandmother   . Hyperlipidemia Maternal Grandmother   . Breast cancer Neg Hx     Allergies and Medications: Allergies  Allergen Reactions  . Prednisone Other (See Comments)    SENT PATIENT INTO A-FIB   . Amitriptyline Other (See Comments)    Made the patient disoriented  . Hydromorphone Hcl Other (See Comments)    Made the blood pressure drop (HYPOtension)  . Lisinopril Cough   No current facility-administered medications on file prior to encounter.    Current Outpatient Medications on File Prior to Encounter  Medication Sig Dispense Refill  . albuterol (PROAIR HFA) 108 (90 Base) MCG/ACT inhaler inhale 2 puffs every 6 hours if needed for wheezing or shortness of breath 8.5 g 1  . albuterol (PROVENTIL) (2.5 MG/3ML) 0.083%  nebulizer solution Take 3 mLs (2.5 mg total) by nebulization every 4 (four) hours as needed for wheezing or shortness of breath (dx: J45.31). 75 mL 5  . amiodarone (PACERONE) 100 MG tablet Take 1  tablet (100 mg total) by mouth daily. 90 tablet 3  . amiodarone (PACERONE) 200 MG tablet Take 1/2 tablet daily 45 tablet 3  . amLODipine (NORVASC) 10 MG tablet Take 1 tablet (10 mg total) by mouth daily. 60 tablet 3  . atorvastatin (LIPITOR) 80 MG tablet Take 80 mg by mouth at bedtime.  0  . calcium carbonate (OS-CAL) 600 MG TABS tablet Take 600 mg by mouth daily.    . cholecalciferol (VITAMIN D) 1000 UNITS tablet Take 1,000 Units by mouth 2 (two) times daily.     . Cranberry 500 MG CAPS Take 500 mg by mouth 2 (two) times daily.    . diphenoxylate-atropine (LOMOTIL) 2.5-0.025 MG tablet Take 1 tablet by mouth 2 (two) times daily as needed for diarrhea or loose stools. 30 tablet 0  . DULoxetine (CYMBALTA) 60 MG capsule Take 60 mg by mouth at bedtime.     Marland Kitchen esomeprazole (NEXIUM) 40 MG capsule TAKE 1 CAPSULE(40 MG) BY MOUTH TWICE DAILY 30 MINUTES BEFORE BREAKFAST AND DINNER 60 capsule 3  . EVZIO 0.4 MG/0.4ML SOAJ Inject 0.4 mLs as directed daily as needed (for overdose).   0  . fluticasone (FLONASE) 50 MCG/ACT nasal spray instill 2 sprays into each nostril once daily 16 g 6  . guaiFENesin (ROBITUSSIN MUCUS+CHEST CONGEST) 100 MG/5ML liquid Take 10 mLs (200 mg total) by mouth 3 (three) times daily as needed for cough. 120 mL 0  . HUMALOG KWIKPEN 100 UNIT/ML KiwkPen Inject 18-32 Units into the skin 3 (three) times daily with meals. PER Patient home SLIDING SCALE 18 units if CBG < 200 32 units  If CBG  > 200  0  . Insulin Glargine (LANTUS SOLOSTAR) 100 UNIT/ML Solostar Pen Inject 40 Units into the skin at bedtime. (Patient taking differently: Inject 50 Units into the skin at bedtime. ) 15 mL 11  . irbesartan (AVAPRO) 75 MG tablet Take 1 tablet by mouth daily.  11  . loperamide (IMODIUM A-D) 2 MG tablet Take 1  tablet (2 mg total) by mouth 4 (four) times daily as needed for diarrhea or loose stools. 30 tablet 0  . loratadine (CLARITIN) 10 MG tablet TAKE 1 TABLET BY MOUTH ONCE DAILY 60 tablet 0  . magnesium oxide (MAG-OX) 400 MG tablet TAKE 1 TABLET BY MOUTH EVERY DAY 90 tablet 3  . mometasone-formoterol (DULERA) 200-5 MCG/ACT AERO Inhale 2 puffs into the lungs 2 (two) times daily. 1 Inhaler 3  . nitroGLYCERIN (NITROSTAT) 0.4 MG SL tablet PLACE 1 TABLET UNDER THE TONGUE IF NEEDED EVERY 5 MINUTES AS NEEDED FOR CHEST PAIN; DO NOT EXCEED 3 DOSES 25 tablet 2  . ondansetron (ZOFRAN) 4 MG tablet Take 1 tablet (4 mg total) by mouth every 8 (eight) hours as needed for nausea or vomiting. 20 tablet 0  . oxyCODONE-acetaminophen (PERCOCET) 10-325 MG tablet Take 1 tablet by mouth 5 (five) times daily. (scheduled)     . Polyvinyl Alcohol-Povidone (REFRESH OP) Place 1 drop into both eyes 3 (three) times daily as needed (for dry eyes).     . potassium chloride (K-DUR) 10 MEQ tablet take 1 tablet by mouth once daily (Patient taking differently: Take 10 MEQ by mouth once daily) 90 tablet 3  . potassium chloride (K-DUR,KLOR-CON) 10 MEQ tablet TAKE 1 TABLET(10 MEQ) BY MOUTH DAILY 30 tablet 9  . pregabalin (LYRICA) 100 MG capsule Take 100 mg by mouth 3 (three) times daily.    . rivaroxaban (XARELTO) 20 MG TABS tablet Take 20 mg by  mouth once a day with supper (Patient taking differently: Take 20 mg by mouth daily with supper. ) 30 tablet 9  . tamsulosin (FLOMAX) 0.4 MG CAPS capsule Take 0.4 mg by mouth at bedtime.  0  . vitamin C (ASCORBIC ACID) 500 MG tablet Take 500 mg by mouth 2 (two) times daily.    . VOLTAREN 1 % GEL APPLY 2 INCHES TO AFFECTED AREA FOUR TIMES A DAY (Patient taking differently: APPLY 2 INCHES TO AFFECTED AREA THREE TIMES A DAY) 500 g 3    Objective: BP (!) 147/81 (BP Location: Right Arm)   Pulse 70   Temp 97.9 F (36.6 C) (Oral)   Resp 18   Ht 5\' 6"  (1.676 m)   Wt 96.6 kg   SpO2 100%   BMI 34.38  kg/m  Physical Exam:  Gen: NAD, alert, non-toxic, well-appearing, lying on left side uncomfortable appearing due to chronic low back pain Skin: Warm and dry. No obvious rashes, lesions, or trauma. Oropharynx clear without erythema or exudate HEENT: NCAT, PERRLA, No conjunctival pallor or injection. No scleral icterus or injection.  MMM.  CV: RRR. <2s capillary refill bilaterally.  RP & DPs 2+ bilaterally. No lower extremity edema Resp: CTAB.  No wheezing, rales, abnormal lung sounds.  No increased WOB on room air Abd: Some tenderness at epigastrium to palpation. Remainder of abdomen nontender. Positive bowel sounds. Psych: Cooperative with exam. Pleasant. Eyes closed during exam. Speech normal. Extremities: Moves all extremities spontaneously. MSK: TTP along chest wall L>R, especially along lower left chest wall  Labs and Imaging: CBC BMET  Recent Labs  Lab 06/01/18 1546  WBC 5.0  HGB 13.2  HCT 41.7  PLT 197   Recent Labs  Lab 06/01/18 1546  NA 133*  K 4.9  CL 102  CO2 19*  BUN 21  CREATININE 1.84*  GLUCOSE 203*  CALCIUM 9.2     Dg Chest 2 View  Result Date: 06/01/2018 CLINICAL DATA:  This study identified as missing a report due to unplanned IT downtime at 6:30 pm on 06/01/2018. 67 year old female with chest pressure since last night. EXAM: CHEST - 2 VIEW COMPARISON:  Chest radiographs 11/23/2017 and earlier. FINDINGS: Semi upright AP and lateral views of the chest. Lung volumes and mediastinal contours remain normal. Visualized tracheal air column is within normal limits. Both lungs appear clear. No pneumothorax or pleural effusion. Negative visible bowel gas pattern. No acute osseous abnormality identified. Partially visible cervical ACDF. IMPRESSION: No acute cardiopulmonary abnormality. Electronically Signed   By: Odessa Fleming M.D.   On: 06/01/2018 18:32    Joana Reamer, DO 06/01/2018, 9:29 PM PGY-1, [facility] FPTS Intern pager: [phone], text pages  welcome  Upper Level Addendum: I have seen and evaluated this patient along with Dr. Mauri Reading and reviewed the above note, making necessary revisions in blue.  Durward Parcel, DO [facility], PGY-3

## 2018-06-01 NOTE — ED Provider Notes (Signed)
[facility] EMERGENCY DEPARTMENT Provider Note   CSN: [id number] Arrival date & time: 06/01/18  1527     History   Chief Complaint Chief Complaint  Patient presents with  . Chest Pain    HPI [name] is a 67 y.o. female.  HPI  67 year old female with a history of A. fib status post ablation, hyperlipidemia, chronic kidney disease and type 2 diabetes presents with chest pain.  She originally had a sinus infection about 3 weeks ago and was treated with antibiotics.  Seem to improve but over the last 3 or 4 days is come back.  Little bit of cough and nasal/chest congestion.  However since last night has been having sharp and dull chest pain that radiates to her left arm.  Took nitroglycerin and it went away last night.  Came back this morning and she saw her PCP who referred her here.  They gave her nitro there as well as aspirin and EMS gave her 1 more nitro.  Has partially helped the pain but is still about a 7 out of 10.  She feels short of breath and nauseated.  No back pain.  She was told her left leg was a little swollen when she went to her kidney specialist about a week ago but she has not noticed any leg swelling.  Past Medical History:  Diagnosis Date  . Anxiety   . Arthritis    "knees, hands, ankles, feet" (05/12/2016)  . Asthma    Dr. [name]  . Atrial fibrillation (HCC)   . Bipolar disorder (HCC)   . Chronic back pain    "lower and middle" (05/12/2016)  . Chronic kidney disease (CKD), stage III (moderate) (HCC) 05/14/2015  . CKD (chronic kidney disease), stage III (HCC)   . Colon polyps   . Depression   . Diverticulosis   . Epilepsy (HCC)   . Essential hypertension   . Fibromyalgia   . Gallstones   . GERD (gastroesophageal reflux disease)   . H/O hiatal hernia   . Hyperlipidemia   . IBS (irritable bowel syndrome)   . Insulin dependent diabetes mellitus (HCC)   . Paroxysmal A-fib (HCC)    failed medical therapy with tikosyn, s/p AF ablation  x 2  . Paroxysmal A-fib (HCC)    a. On [name]. b. Recurrence 08/2014 in setting of GI illness.  . Paroxysmal atrial fibrillation (HCC) 05/18/2017  . Peripheral neuropathy   . Persistent atrial fibrillation   . Sarcoidosis of lung (HCC)    Dr. [name]  . Seizures (HCC)    "epileptic; pretty regular recently" (05/12/2016)  . Syncope   . Type II diabetes mellitus (HCC)   . Type II diabetes mellitus [facility])     Patient Active Problem List   Diagnosis Date Noted  . Atypical chest pain 06/01/2018  . Seasonal allergies 04/22/2018  . Nausea without vomiting 01/26/2018  . Cough due to ACE inhibitor 12/21/2017  . Gait abnormality 11/30/2017  . Cardiac syncope 11/22/2017  . Bipolar disorder (HCC)   . Chronic back pain   . CKD (chronic kidney disease), stage III (HCC)   . Depression   . Anxiety   . Estrogen deficiency 06/07/2017  . Wellness examination 06/03/2017  . A-fib (HCC) 05/05/2017  . Back pain 12/11/2016  . Pelvic pain 11/25/2016  . Chronic pain 09/08/2016  . Spells of decreased attentiveness 09/08/2016  . Irritable bowel syndrome 03/03/2016  . Chronic leukopenia 09/13/2015  . PAF (paroxysmal atrial fibrillation) (HCC)  08/22/2015  . Morbid obesity (HCC) 07/11/2015  . Bipolar I disorder (HCC) 05/22/2015  . Seizure disorder (HCC)   . History of diverticulitis   . Syncope 06/29/2012  . Orthostatic hypotension 06/29/2012  . History of colonic polyps 09/01/2011  . Sarcoid (HCC) 09/01/2011  . Asthma 09/01/2011  . GERD (gastroesophageal reflux disease) 09/01/2011  . Diabetes mellitus type 2, uncontrolled, with complications (HCC) 11/19/2008  . Hyperlipidemia 11/19/2008  . Essential hypertension 11/19/2008    Past Surgical History:  Procedure Laterality Date  . ABDOMINAL HYSTERECTOMY  1995  . ANTERIOR CERVICAL DECOMP/DISCECTOMY FUSION  07/11/2012   Procedure: ANTERIOR CERVICAL DECOMPRESSION/DISCECTOMY FUSION 1 LEVEL;  Surgeon: [name], MD;  Location: [facility] NEURO ORS;   Service: Neurosurgery;  Laterality: N/A;  Cervical Five-Six Anterior Cervical Decompression with Fusion Interbody Prothesis Plating and Bonegraft  . ATRIAL FIBRILLATION ABLATION N/A 05/18/2017   Procedure: Atrial Fibrillation Ablation;  Surgeon: [name], MD;  Location: [facility] INVASIVE CV LAB;  Service: Cardiovascular;  Laterality: N/A;  . ATRIAL FIBRILLATION ABLATION N/A 11/16/2017   Procedure: ATRIAL FIBRILLATION ABLATION;  Surgeon: [name], MD;  Location: [facility] INVASIVE CV LAB;  Service: Cardiovascular;  Laterality: N/A;  . CARDIAC CATHETERIZATION  2012   a. Normal coronaries 2012. b. Normal nuc 09/2014.  Marland Kitchen CARDIAC CATHETERIZATION  05/18/2017  . CHOLECYSTECTOMY OPEN  1976  . CLOSED REDUCTION ANKLE FRACTURE Left 10/2006   "got steel rod in my leg; and screws"  . COLON SURGERY    . DILATION AND CURETTAGE OF UTERUS    . ESOPHAGEAL MANOMETRY  03/21/2012   Procedure: ESOPHAGEAL MANOMETRY (EM);  Surgeon: [name], MD;  Location: [facility] ENDOSCOPY;  Service: Endoscopy;  Laterality: N/A;  . FRACTURE SURGERY    . NEUROPLASTY / TRANSPOSITION MEDIAN NERVE AT CARPAL TUNNEL Left 2004  . NEUROPLASTY / TRANSPOSITION MEDIAN NERVE AT CARPAL TUNNEL Right 2002  . 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 SALPINGOOPHORECTOMY Bilateral 2000  . TEE WITHOUT CARDIOVERSION N/A 05/18/2017   Procedure: TRANSESOPHAGEAL ECHOCARDIOGRAM (TEE);  Surgeon: [name], MD;  Location: [facility] ENDOSCOPY;  Service: Cardiovascular;  Laterality: N/A;  . TUBAL LIGATION  1982     OB History   None      Home Medications    Prior to Admission medications   Medication Sig Start Date End Date Taking? Authorizing Provider  albuterol (PROAIR HFA) 108 (90 Base) MCG/ACT inhaler inhale 2 puffs every 6 hours if needed for wheezing or shortness of breath 08/30/17   Mikell, Antionette Poles, MD  albuterol (PROVENTIL) (2.5 MG/3ML) 0.083% nebulizer solution Take 3 mLs (2.5 mg total) by nebulization every 4 (four) hours as needed for  wheezing or shortness of breath (dx: J45.31). 12/07/17   Parrett, Virgel Bouquet, NP  amiodarone (PACERONE) 100 MG tablet Take 1 tablet (100 mg total) by mouth daily. 02/21/18   Allred, Fayrene Fearing, MD  amiodarone (PACERONE) 200 MG tablet Take 1/2 tablet daily 02/22/18   Allred, Fayrene Fearing, MD  amLODipine (NORVASC) 10 MG tablet Take 1 tablet (10 mg total) by mouth daily. 03/30/18   Anderson, Chelsey L, DO  atorvastatin (LIPITOR) 80 MG tablet Take 80 mg by mouth at bedtime. 05/29/16   [provider]  calcium carbonate (OS-CAL) 600 MG TABS tablet Take 600 mg by mouth daily.    [provider]  cholecalciferol (VITAMIN D) 1000 UNITS tablet Take 1,000 Units by mouth 2 (two) times daily.     [provider]  Cranberry 500 MG CAPS Take 500 mg  by mouth 2 (two) times daily.    [provider]  diphenoxylate-atropine (LOMOTIL) 2.5-0.025 MG tablet Take 1 tablet by mouth 2 (two) times daily as needed for diarrhea or loose stools. 12/06/17   Meredith Pel, NP  DULoxetine (CYMBALTA) 60 MG capsule Take 60 mg by mouth at bedtime.     [provider]  esomeprazole (NEXIUM) 40 MG capsule TAKE 1 CAPSULE(40 MG) BY MOUTH TWICE DAILY 30 MINUTES BEFORE BREAKFAST AND DINNER 05/24/18   Anderson, Chelsey L, DO  EVZIO 0.4 MG/0.4ML SOAJ Inject 0.4 mLs as directed daily as needed (for overdose).  07/10/15   [provider]  fluticasone Aleda Grana) 50 MCG/ACT nasal spray instill 2 sprays into each nostril once daily 04/21/18   Wendee Beavers, DO  guaiFENesin (ROBITUSSIN MUCUS+CHEST CONGEST) 100 MG/5ML liquid Take 10 mLs (200 mg total) by mouth 3 (three) times daily as needed for cough. 02/02/18   Mikell, Antionette Poles, MD  HUMALOG KWIKPEN 100 UNIT/ML KiwkPen Inject 18-32 Units into the skin 3 (three) times daily with meals. PER Patient home SLIDING SCALE 18 units if CBG < 200 32 units  If CBG  > 200 05/04/17   [provider]  Insulin Glargine (LANTUS SOLOSTAR) 100 UNIT/ML Solostar Pen  Inject 40 Units into the skin at bedtime. Patient taking differently: Inject 50 Units into the skin at bedtime.  11/25/17   Garnette Gunner, MD  irbesartan (AVAPRO) 75 MG tablet Take 1 tablet by mouth daily. 12/23/17   [provider]  loperamide (IMODIUM A-D) 2 MG tablet Take 1 tablet (2 mg total) by mouth 4 (four) times daily as needed for diarrhea or loose stools. 09/02/17   Mikell, Antionette Poles, MD  loratadine (CLARITIN) 10 MG tablet TAKE 1 TABLET BY MOUTH ONCE DAILY 03/01/18   Anderson, Chelsey L, DO  magnesium oxide (MAG-OX) 400 MG tablet TAKE 1 TABLET BY MOUTH EVERY DAY 03/01/18   Wendall Stade, MD  mometasone-formoterol (DULERA) 200-5 MCG/ACT AERO Inhale 2 puffs into the lungs 2 (two) times daily. 01/21/18   Coral Ceo, NP  nitroGLYCERIN (NITROSTAT) 0.4 MG SL tablet PLACE 1 TABLET UNDER THE TONGUE IF NEEDED EVERY 5 MINUTES AS NEEDED FOR CHEST PAIN; DO NOT EXCEED 3 DOSES 04/08/18   Allred, Fayrene Fearing, MD  ondansetron (ZOFRAN) 4 MG tablet Take 1 tablet (4 mg total) by mouth every 8 (eight) hours as needed for nausea or vomiting. 10/08/17   Mikell, Antionette Poles, MD  oxyCODONE-acetaminophen (PERCOCET) 10-325 MG tablet Take 1 tablet by mouth 5 (five) times daily. (scheduled)     [provider]  Polyvinyl Alcohol-Povidone (REFRESH OP) Place 1 drop into both eyes 3 (three) times daily as needed (for dry eyes).     [provider]  potassium chloride (K-DUR) 10 MEQ tablet take 1 tablet by mouth once daily Patient taking differently: Take 10 MEQ by mouth once daily 05/04/17   Wendall Stade, MD  potassium chloride (K-DUR,KLOR-CON) 10 MEQ tablet TAKE 1 TABLET(10 MEQ) BY MOUTH DAILY 05/23/18   Wendall Stade, MD  pregabalin (LYRICA) 100 MG capsule Take 100 mg by mouth 3 (three) times daily.    [provider]  rivaroxaban (XARELTO) 20 MG TABS tablet Take 20 mg by mouth once a day with supper Patient taking differently: Take 20 mg by mouth daily with supper.  08/11/17    Wendall Stade, MD  tamsulosin (FLOMAX) 0.4 MG CAPS capsule Take 0.4 mg by mouth at bedtime. 04/16/17  [provider]  vitamin C (ASCORBIC ACID) 500 MG tablet Take 500 mg by mouth 2 (two) times daily.    [provider]  VOLTAREN 1 % GEL APPLY 2 INCHES TO AFFECTED AREA FOUR TIMES A DAY Patient taking differently: APPLY 2 INCHES TO AFFECTED AREA THREE TIMES A DAY 01/29/17   Mikell, Antionette Poles, MD    Family History Family History  Problem Relation Age of Onset  . Heart attack Mother        @ age 80  . Mental illness Mother   . Diabetes Mother   . Hypertension Mother        siblings  . Alzheimer's disease Mother   . Depression Mother   . Hyperlipidemia Mother   . Heart attack Brother 50  . Alcohol abuse Brother   . Depression Brother   . Diabetes Brother   . Hyperlipidemia Brother   . Hypertension Brother   . Kidney disease Brother   . Drug abuse Brother   . Alcohol abuse Father   . Heart attack Father   . Hyperlipidemia Father   . Hypertension Father   . Colon cancer Maternal Aunt   . Prostate cancer Maternal Grandfather   . Diabetes Maternal Grandfather   . Hyperlipidemia Maternal Grandfather   . Ovarian cancer Maternal Aunt   . Diabetes Unknown   . Hypertension Unknown   . Lupus Sister   . Alcohol abuse Sister   . Depression Sister   . Diabetes Sister   . Hyperlipidemia Sister   . Hypertension Sister   . Kidney disease Sister   . Drug abuse Sister   . Ovarian cancer Cousin   . Diabetes Maternal Grandmother   . Hyperlipidemia Maternal Grandmother   . Breast cancer Neg Hx     Social History Social History   Tobacco Use  . Smoking status: Never Smoker  . Smokeless tobacco: Never Used  Substance Use Topics  . Alcohol use: No  . Drug use: No     Allergies   Prednisone; Amitriptyline; Hydromorphone hcl; and Lisinopril   Review of Systems Review of Systems  Respiratory: Positive for cough and shortness of breath.   Cardiovascular:  Positive for chest pain.  Gastrointestinal: Negative for abdominal pain.  Musculoskeletal: Negative for back pain.  All other systems reviewed and are negative.    Physical Exam Updated Vital Signs BP (!) 147/81 (BP Location: Right Arm)   Pulse 70   Temp 97.9 F (36.6 C) (Oral)   Resp 18   Ht 5\' 6"  (1.676 m)   Wt 96.6 kg   SpO2 100%   BMI 34.38 kg/m   Physical Exam  Constitutional: She is oriented to person, place, and time. She appears well-developed and well-nourished.  Non-toxic appearance. She does not appear ill.  HENT:  Head: Normocephalic and atraumatic.  Right Ear: External ear normal.  Left Ear: External ear normal.  Nose: Nose normal.  Eyes: Right eye exhibits no discharge. Left eye exhibits no discharge.  Cardiovascular: Normal rate, regular rhythm and normal heart sounds.  Pulses:      Radial pulses are 2+ on the right side, and 2+ on the left side.  Pulmonary/Chest: Effort normal and breath sounds normal. She exhibits tenderness.    Abdominal: Soft. There is no tenderness.  Musculoskeletal:       Right lower leg: She exhibits no edema.       Left lower leg: She exhibits no edema.  Neurological: She is alert and oriented  to person, place, and time.  Skin: Skin is warm and dry.  Nursing note and vitals reviewed.    ED Treatments / Results  Labs (all labs ordered are listed, but only abnormal results are displayed) Labs Reviewed  BASIC METABOLIC PANEL - Abnormal; Notable for the following components:      Result Value   Sodium 133 (*)    CO2 19 (*)    Glucose, Bld 203 (*)    Creatinine, Ser 1.84 (*)    GFR calc non Af Amer 27 (*)    GFR calc Af Amer 32 (*)    All other components within normal limits  CBC  D-DIMER, QUANTITATIVE (NOT AT [facility])  HIV ANTIBODY (ROUTINE TESTING W REFLEX)  TROPONIN I  TROPONIN I  TROPONIN I  HEMOGLOBIN A1C  I-STAT TROPONIN, ED    EKG EKG Interpretation  Date/Time:  Wednesday June 01 2018 18:36:04  EDT Ventricular Rate:  78 PR Interval:    QRS Duration: 83 QT Interval:  403 QTC Calculation: 459 R Axis:   -16 Text Interpretation:  Sinus rhythm Borderline left axis deviation Low voltage, precordial leads no significant change since earlier in the day Confirmed by Pricilla Loveless [phone]) on 06/01/2018 7:07:47 PM   Radiology Dg Chest 2 View  Result Date: 06/01/2018 CLINICAL DATA:  This study identified as missing a report due to unplanned IT downtime at 6:30 pm on 06/01/2018. 67 year old female with chest pressure since last night. EXAM: CHEST - 2 VIEW COMPARISON:  Chest radiographs 11/23/2017 and earlier. FINDINGS: Semi upright AP and lateral views of the chest. Lung volumes and mediastinal contours remain normal. Visualized tracheal air column is within normal limits. Both lungs appear clear. No pneumothorax or pleural effusion. Negative visible bowel gas pattern. No acute osseous abnormality identified. Partially visible cervical ACDF. IMPRESSION: No acute cardiopulmonary abnormality. Electronically Signed   By: Odessa Fleming M.D.   On: 06/01/2018 18:32    Procedures Procedures (including critical care time)  Medications Ordered in ED Medications  nitroGLYCERIN (NITROSTAT) SL tablet 0.4 mg (0.4 mg Sublingual Given 06/01/18 1922)  oxyCODONE-acetaminophen (PERCOCET/ROXICET) 5-325 MG per tablet 1 tablet (has no administration in time range)    And  oxyCODONE (Oxy IR/ROXICODONE) immediate release tablet 5 mg (5 mg Oral Given 06/01/18 2046)  amiodarone (PACERONE) tablet 100 mg (has no administration in time range)  amLODipine (NORVASC) tablet 10 mg (has no administration in time range)  atorvastatin (LIPITOR) tablet 80 mg (has no administration in time range)  DULoxetine (CYMBALTA) DR capsule 60 mg (has no administration in time range)  pantoprazole (PROTONIX) EC tablet 40 mg (has no administration in time range)  rivaroxaban (XARELTO) tablet 20 mg (has no administration in time range)   pregabalin (LYRICA) capsule 100 mg (has no administration in time range)  calcium carbonate (OS-CAL) tablet 600 mg (has no administration in time range)  cholecalciferol (VITAMIN D) tablet 1,000 Units (has no administration in time range)  mometasone-formoterol (DULERA) 200-5 MCG/ACT inhaler 2 puff (has no administration in time range)  albuterol (PROVENTIL) (2.5 MG/3ML) 0.083% nebulizer solution 2.5 mg (has no administration in time range)  oxyCODONE-acetaminophen (PERCOCET) 10-325 MG per tablet 1 tablet (has no administration in time range)  ondansetron (ZOFRAN) injection 4 mg (has no administration in time range)  insulin glargine (LANTUS) injection 30 Units (has no administration in time range)  insulin aspart (novoLOG) injection 0-15 Units (has no administration in time range)  insulin aspart (novoLOG) injection 0-5 Units (has no administration in time  range)  sodium chloride 0.9 % bolus 1,000 mL (0 mLs Intravenous Stopped 06/01/18 1836)  morphine 4 MG/ML injection 4 mg (4 mg Intravenous Given 06/01/18 1625)  ondansetron (ZOFRAN) injection 4 mg (4 mg Intravenous Given 06/01/18 1837)     Initial Impression / Assessment and Plan / ED Course  I have reviewed the triage vital signs and the nursing notes.  Pertinent labs & imaging results that were available during my care of the patient were reviewed by me and considered in my medical decision making (see chart for details).     Unclear cause of her chest pain.  However she has multiple risk factors for ACS and thus will be admitted to the family practice service.  Given the atypical nature, along with her reporting some low oxygen last night on pulse oximeter, d-dimer sent for otherwise low risk PE.  This is negative.  She was given morphine given some low blood pressure on arrival and then will return to nitroglycerin as this is seem to help.  Admit to the family practice service for ACS rule out.  Doubt dissection.  Final Clinical  Impressions(s) / ED Diagnoses   Final diagnoses:  Atypical chest pain    ED Discharge Orders    None       Pricilla Loveless, MD 06/01/18 2132

## 2018-06-01 NOTE — ED Triage Notes (Signed)
Pt arrives via EMS from doctors office with complaints of CP/pressure that started last night. Relief with nitro. Recurrent chest pressure while at doctors that increased with breathing. 325 ASA, 2 nitro given.

## 2018-06-01 NOTE — ED Notes (Signed)
Patient transported to X-ray 

## 2018-06-02 ENCOUNTER — Other Ambulatory Visit: Payer: Self-pay

## 2018-06-02 ENCOUNTER — Encounter (HOSPITAL_COMMUNITY): Payer: Self-pay | Admitting: Emergency Medicine

## 2018-06-02 ENCOUNTER — Observation Stay (HOSPITAL_COMMUNITY): Payer: Medicare Other

## 2018-06-02 ENCOUNTER — Observation Stay (HOSPITAL_BASED_OUTPATIENT_CLINIC_OR_DEPARTMENT_OTHER): Payer: Medicare Other

## 2018-06-02 DIAGNOSIS — I5030 Unspecified diastolic (congestive) heart failure: Secondary | ICD-10-CM | POA: Diagnosis not present

## 2018-06-02 DIAGNOSIS — R0789 Other chest pain: Secondary | ICD-10-CM | POA: Diagnosis not present

## 2018-06-02 DIAGNOSIS — I951 Orthostatic hypotension: Secondary | ICD-10-CM | POA: Diagnosis not present

## 2018-06-02 DIAGNOSIS — I309 Acute pericarditis, unspecified: Secondary | ICD-10-CM

## 2018-06-02 DIAGNOSIS — N179 Acute kidney failure, unspecified: Secondary | ICD-10-CM | POA: Diagnosis not present

## 2018-06-02 DIAGNOSIS — D869 Sarcoidosis, unspecified: Secondary | ICD-10-CM | POA: Diagnosis not present

## 2018-06-02 LAB — GLUCOSE, CAPILLARY
GLUCOSE-CAPILLARY: 160 mg/dL — AB (ref 70–99)
Glucose-Capillary: 221 mg/dL — ABNORMAL HIGH (ref 70–99)
Glucose-Capillary: 222 mg/dL — ABNORMAL HIGH (ref 70–99)
Glucose-Capillary: 179 mg/dL — ABNORMAL HIGH (ref 70–99)

## 2018-06-02 LAB — TROPONIN I

## 2018-06-02 LAB — HEMOGLOBIN A1C
HEMOGLOBIN A1C: 9 % — AB (ref 4.8–5.6)
MEAN PLASMA GLUCOSE: 211.6 mg/dL

## 2018-06-02 LAB — CBC
HEMATOCRIT: 38.6 % (ref 36.0–46.0)
HEMOGLOBIN: 12.1 g/dL (ref 12.0–15.0)
MCH: 28.7 pg (ref 26.0–34.0)
MCHC: 31.3 g/dL (ref 30.0–36.0)
MCV: 91.7 fL (ref 80.0–100.0)
Platelets: 188 10*3/uL (ref 150–400)
RBC: 4.21 MIL/uL (ref 3.87–5.11)
RDW: 12.8 % (ref 11.5–15.5)
WBC: 4.7 10*3/uL (ref 4.0–10.5)
nRBC: 0 % (ref 0.0–0.2)

## 2018-06-02 LAB — BASIC METABOLIC PANEL
Anion gap: 12 (ref 5–15)
BUN: 14 mg/dL (ref 8–23)
CHLORIDE: 103 mmol/L (ref 98–111)
CO2: 23 mmol/L (ref 22–32)
CREATININE: 1.64 mg/dL — AB (ref 0.44–1.00)
Calcium: 8.9 mg/dL (ref 8.9–10.3)
GFR calc Af Amer: 37 mL/min — ABNORMAL LOW (ref >60)
GFR calc non Af Amer: 32 mL/min — ABNORMAL LOW (ref >60)
GLUCOSE: 185 mg/dL — AB (ref 70–99)
Potassium: 4.4 mmol/L (ref 3.5–5.1)
Sodium: 138 mmol/L (ref 135–145)

## 2018-06-02 LAB — HIV ANTIBODY (ROUTINE TESTING W REFLEX): HIV Screen 4th Generation wRfx: NONREACTIVE

## 2018-06-02 LAB — ECHOCARDIOGRAM COMPLETE
Height: 66 in
Weight: 3408 oz

## 2018-06-02 LAB — TSH: TSH: 1.676 u[IU]/mL (ref 0.350–4.500)

## 2018-06-02 MED ORDER — DICLOFENAC SODIUM 1 % TD GEL
2.0000 g | Freq: Three times a day (TID) | TRANSDERMAL | Status: DC
  Administered 2018-06-02 – 2018-06-03 (×7): 2 g via TOPICAL
  Filled 2018-06-02: qty 100

## 2018-06-02 NOTE — Care Management Obs Status (Signed)
MEDICARE OBSERVATION STATUS NOTIFICATION   Patient Details  Name: [name] MRN: [id number] Date of Birth: 29-Jul-1951   Medicare Observation Status Notification Given:  Yes    Colleen Can RN, BSN, NCM-BC, ACM-RN [phone] 06/02/2018, 3:36 PM

## 2018-06-02 NOTE — Discharge Summary (Addendum)
Family Medicine Teaching [facility] Discharge Summary  Patient name: [name] Medical record number: [id number] Date of birth: 24-Feb-1951 Age: 67 y.o. Gender: female Date of Admission: 06/01/2018  Date of Discharge: 06/03/2018 Admitting Physician: Joana Reamer, DO  Primary Care Provider: Leeroy Bock, DO Consultants: Cardiology  Indication for Hospitalization: Chest Pain and AKI  Discharge Diagnoses/Problem List:  Atypical Chest Pain Left Shoulder Pain AKI with CKD III Hx A fib s/p ablation Type 2 IDDM Chronic Low Back Pain HTN HLD Asthma with COPD IBS with Diarrhea Peripheral Neuropathy GERD Hx Orthostatic hypotension Sarcoidosis Morbid obesity  Disposition: home with home health PT  Discharge Condition: stable  Discharge Exam:  General: 67 y.o. female in NAD Cardio: RRR no m/r/g, left chest wall TTP Lungs: CTAB, no wheezing, no rhonchi, no crackles Abdomen: Soft, non-tender to palpation, positive bowel sounds Skin: warm and dry Extremities: No edema, no TTP of right shoulder   Brief Hospital Course:  Toni Castleman Flowersis a 66 y.o.femalepresenting with atypical chest pain. PMH is significant forIDT2DM, HDL, HTN, Asthma, GERD, A-fib on xarelto s/p ablation (02/2018),Sarcoidosis, CKD3.  Her hospital course is outlined below.  Chest Pain Cardiology workup was negative for ACS including negative troponins, EKG was not suggestive of acute ischemia. Pain remained reproducible to palpation and worsens with movement.  Pain radiated to her left shoulder, therefore X-ray was obtained, but was negative for acute fracture and no evidence of edema noted.  Her shoulder was not TTP.  Echo showed EF 65 to 70% without wall or valvular abnormalities.  Cardiology believed that patient may have pericarditis and benefit from a course of colchicine.  Given that her chest pain was not improved by leaning forward, she had no rubs on exam, and echo did not show  pericardial effusion decision was made to hold off on treatment.  Believe this chest pain to be musculoskeletal, given negative cardiac work-up, lack of shortness of breath, pain that is reproducible to palpation and worsens with changing in position.  She was given Voltaren gel to use 3 times daily.  Patient follows up very well with the family medicine clinic and was scheduled with an appointment for 10/21.  At that time if her chest pain has worsened, if she developed a rub on exam,or if there appears to have a new murmur suggestive clinical picture of pericarditis, would consider treating with colchicine 0.3 mg twice daily x3 months per cardiology recommendation.  She will have a follow-up with cardiology in 1 month.  AKI on CKD III Cr elevated on admission to 1.84, likely in the setting of decreased PO intake from recent URI.  Improved with IV fluids and increased PO intake while inpatient.  Her creatinine at the time of discharge was 1.55.  Her baseline creatinine is 1-1.3.  Suspect will continue to improve to baseline while outpatient.  She should have a repeat BMP in 1 week.  Near-syncope Prior to discharge patient had a near syncopal episode while working with PT.  She stated that she got very weak and "felt like she was going to pass out".  Patient was seen and examined following this episode, no arrhythmia was noted on telemetry, physical exam was essentially benign.  Patient noted a "funny" sensation in her right face in all distributions of trigeminal nerve, as well as right upper and lower extremities.  She moans throughout this exam but was easily distracted and would converse appropriately.  Her physical exam findings did not correlate with her clinical  picture.  Orthostatic vitals were obtained and were negative.  She is chronically anticoagulated on Xarelto and did not have a fall or reason to believe there is intracranial trauma or bleeding.  Given this and discussion with patient, decision  was made to discharge patient with close follow-up at family medicine clinic.  Physical therapy, given this episode, had recommended SNF for the patient.  The primary team disagreed with this statement given her physical exam findings following the event.  Patient was discharged home with home health PT.  At the time of discharge patient was with stable vitals and was agreeable to the plan.  Issues for Follow Up:  1. Will need follow up of chest pain. Consider colchicine 0.3mg  BID x3 months if chest pain does not improve, given cardiology's suspicion of pericarditis. 2. Please administer Pneumococcal vaccine as outpatient. 3. Follow-up of weakness.  Patient receiving home health PT. 4. Repeat BMP in 1 week to monitor creatinine, patient with AKI on admission.  Most recent creatinine 1.55.  Baseline creatinine 1-1.3. 5. Follow-up of referred left shoulder pain.  X-ray negative.  Consider further work-up and possible subacromial injection if needed as outpatient.  Significant Procedures: Echo  Significant Labs and Imaging:  Recent Labs  Lab 06/01/18 1546 06/02/18 0453 06/03/18 0746  WBC 5.0 4.7 3.7*  HGB 13.2 12.1 12.1  HCT 41.7 38.6 38.0  PLT 197 188 150   Recent Labs  Lab 06/01/18 1546 06/02/18 0453 06/03/18 0407  NA 133* 138 136  K 4.9 4.4 4.2  CL 102 103 104  CO2 19* 23 23  GLUCOSE 203* 185* 169*  BUN 21 14 14   CREATININE 1.84* 1.64* 1.55*  CALCIUM 9.2 8.9 9.3    Echo - Normal LV size with mild LV hypertrophy. EF 65-70%, vigorous   systolic function. Normal RV size and systolic function. No   significant valvular abnormalities.  Dg Chest 2 View  Result Date: 06/01/2018 CLINICAL DATA:  This study identified as missing a report due to unplanned IT downtime at 6:30 pm on 06/01/2018. 67 year old female with chest pressure since last night. EXAM: CHEST - 2 VIEW COMPARISON:  Chest radiographs 11/23/2017 and earlier. FINDINGS: Semi upright AP and lateral views of the chest.  Lung volumes and mediastinal contours remain normal. Visualized tracheal air column is within normal limits. Both lungs appear clear. No pneumothorax or pleural effusion. Negative visible bowel gas pattern. No acute osseous abnormality identified. Partially visible cervical ACDF. IMPRESSION: No acute cardiopulmonary abnormality. Electronically Signed   By: Odessa Fleming M.D.   On: 06/01/2018 18:32   Dg Shoulder Left  Result Date: 06/02/2018 CLINICAL DATA:  Left shoulder pain.  Pain for 1 week.  No injury. EXAM: LEFT SHOULDER - 2+ VIEW COMPARISON:  08/24/2016 FINDINGS: There is no evidence of fracture or dislocation. There is no evidence of arthropathy or other focal bone abnormality. Soft tissues are unremarkable. Postoperative changes in the cervical spine. IMPRESSION: Negative. Electronically Signed   By: Burman Nieves M.D.   On: 06/02/2018 06:05   Results/Tests Pending at Time of Discharge: None  Discharge Medications:  Allergies as of 06/03/2018      Reactions   Prednisone Other (See Comments)   SENT PATIENT INTO A-FIB    Amitriptyline Other (See Comments)   Made the patient disoriented   Hydromorphone Hcl Other (See Comments)   Made the blood pressure drop (HYPOtension)   Lisinopril Cough      Medication List    STOP taking these medications  loperamide 2 MG tablet Commonly known as:  IMODIUM A-D   potassium chloride 10 MEQ tablet Commonly known as:  K-DUR,KLOR-CON     TAKE these medications   albuterol 108 (90 Base) MCG/ACT inhaler Commonly known as:  PROVENTIL HFA;VENTOLIN HFA inhale 2 puffs every 6 hours if needed for wheezing or shortness of breath   albuterol (2.5 MG/3ML) 0.083% nebulizer solution Commonly known as:  PROVENTIL Take 3 mLs (2.5 mg total) by nebulization every 4 (four) hours as needed for wheezing or shortness of breath (dx: J45.31).   amiodarone 100 MG tablet Commonly known as:  PACERONE Take 1 tablet (100 mg total) by mouth daily. What changed:     how much to take  Another medication with the same name was removed. Continue taking this medication, and follow the directions you see here.   amLODipine 10 MG tablet Commonly known as:  NORVASC Take 1 tablet (10 mg total) by mouth daily.   atorvastatin 80 MG tablet Commonly known as:  LIPITOR Take 80 mg by mouth at bedtime.   calcium carbonate 600 MG Tabs tablet Commonly known as:  OS-CAL Take 600 mg by mouth daily.   cholecalciferol 1000 units tablet Commonly known as:  VITAMIN D Take 1,000 Units by mouth 2 (two) times daily.   Cranberry 500 MG Caps Take 500 mg by mouth 2 (two) times daily.   diclofenac sodium 1 % Gel Commonly known as:  VOLTAREN APPLY 2 INCHES TO AFFECTED AREA THREE TIMES A DAY   diphenoxylate-atropine 2.5-0.025 MG tablet Commonly known as:  LOMOTIL Take 1 tablet by mouth 2 (two) times daily as needed for diarrhea or loose stools.   DULoxetine 60 MG capsule Commonly known as:  CYMBALTA Take 60 mg by mouth at bedtime.   esomeprazole 40 MG capsule Commonly known as:  NEXIUM TAKE 1 CAPSULE(40 MG) BY MOUTH TWICE DAILY 30 MINUTES BEFORE BREAKFAST AND DINNER   EVZIO 0.4 MG/0.4ML Soaj Generic drug:  Naloxone HCl Inject 0.4 mLs as directed daily as needed (for overdose).   fluticasone 50 MCG/ACT nasal spray Commonly known as:  FLONASE instill 2 sprays into each nostril once daily   guaiFENesin 100 MG/5ML liquid Commonly known as:  ROBITUSSIN Take 10 mLs (200 mg total) by mouth 3 (three) times daily as needed for cough.   HUMALOG KWIKPEN 100 UNIT/ML KiwkPen Generic drug:  insulin lispro Inject 18-32 Units into the skin 2 (two) times daily. PER Patient home SLIDING SCALE 18 units if CBG < 200 32 units  If CBG  > 200   Insulin Glargine 100 UNIT/ML Solostar Pen Commonly known as:  LANTUS Inject 40 Units into the skin at bedtime. What changed:  how much to take   irbesartan 75 MG tablet Commonly known as:  AVAPRO Take 1 tablet by mouth  daily.   loratadine 10 MG tablet Commonly known as:  CLARITIN TAKE 1 TABLET BY MOUTH ONCE DAILY   magnesium oxide 400 MG tablet Commonly known as:  MAG-OX TAKE 1 TABLET BY MOUTH EVERY DAY   mometasone-formoterol 200-5 MCG/ACT Aero Commonly known as:  DULERA Inhale 2 puffs into the lungs 2 (two) times daily.   nitroGLYCERIN 0.4 MG SL tablet Commonly known as:  NITROSTAT PLACE 1 TABLET UNDER THE TONGUE IF NEEDED EVERY 5 MINUTES AS NEEDED FOR CHEST PAIN; DO NOT EXCEED 3 DOSES   ondansetron 4 MG tablet Commonly known as:  ZOFRAN Take 1 tablet (4 mg total) by mouth every 8 (eight) hours as needed for  nausea or vomiting.   PERCOCET 10-325 MG tablet Generic drug:  oxyCODONE-acetaminophen Take 1 tablet by mouth 5 (five) times daily. (scheduled)   potassium chloride 10 MEQ tablet Commonly known as:  K-DUR take 1 tablet by mouth once daily What changed:    how much to take  how to take this  when to take this   pregabalin 100 MG capsule Commonly known as:  LYRICA Take 100 mg by mouth 3 (three) times daily.   REFRESH OP Place 1 drop into both eyes 3 (three) times daily as needed (for dry eyes).   rivaroxaban 20 MG Tabs tablet Commonly known as:  XARELTO Take 20 mg by mouth once a day with supper What changed:    how much to take  how to take this  when to take this  additional instructions   tamsulosin 0.4 MG Caps capsule Commonly known as:  FLOMAX Take 0.4 mg by mouth at bedtime.   vitamin C 500 MG tablet Commonly known as:  ASCORBIC ACID Take 500 mg by mouth 2 (two) times daily.       Discharge Instructions: Please refer to Patient Instructions section of EMR for full details.  Patient was counseled important signs and symptoms that should prompt return to medical care, changes in medications, dietary instructions, activity restrictions, and follow up appointments.   Follow-Up Appointments: Follow-up Information    Leone Brand, NP Follow up.    Specialties:  Cardiology, Radiology Why:  Cardiology hospital follow up on 07/04/18 at 10:30. Please arrive 15 minutes early for check in.  Contact information: [address] [place] [address] [phone]        Lennox Solders, MD. Go on 06/06/2018.   Specialty:  Family Medicine Why:  at 1:30 pm  Contact information: [address] [place] [address] [phone]        [facility] Follow up.   Specialty:  Home Health Services Why:  Home Health Physical Therapy Contact information: [address] [place] [address] [phone]           Unknown Jim, DO 06/03/2018, 4:31 PM PGY-1, [facility]  Denny Levy MD Attending Physician  Family Medicine Teaching Service  Discharge Note : Attending Denny Levy MD Pager [phone] Office [phone] I have seen and examined this patient, reviewed their chart and discussed discharge planning with the resident at the time of discharge. I agree with the discharge plan as above. She is stable for discharge. Very unlikely there is pericarditis as part of this clinical picture. We have close follow up. She does not need SNF and is safe in my opinion to be discharged as detailed above. We have continuity follow up with her scheduled at our clinic and her PCp is aware of and agrees wit this plan.

## 2018-06-02 NOTE — Progress Notes (Signed)
Pt ambulated from bed to doorway 2+ standby assist w/ walker and gait belt. Pt  stats were 95%-99% RA. Pt experienced unsteady gait w/ dizziness. Pt placed back on 2L/o2 in bed. Pt A&O4 and follows command

## 2018-06-02 NOTE — Consult Note (Addendum)
Cardiology Consult    Patient ID: [name] MRN: [id number], DOB/AGE: 26-Sep-1950   Admit date: 06/01/2018 Date of Consult: 06/02/2018  Primary Physician: Richarda Osmond, DO Primary Cardiologist: Jenkins Rouge, MD  Primary Electrophysiologist: Thompson Grayer, MD Requesting Provider: Dr. Nori Riis  Patient Profile    [name] is a 67 y.o. female with a history of paroxysmal atrial fibrillation on Xarelto, sick sinus syndrome, hypertension, hyperlipidemia, type 2 diabetes mellitus, chronic kidney disease stage III, GERD, and asthma, who is being seen today for the evaluation of chest pain at the request of Dr. Nori Riis.  History of Present Illness    Patient is followed by Dr. Rayann Heman for her atrial fibrillation. Patient had a syncopal episode on 10/07/2017 for which she was seen in the ED. She last saw Dr. Rayann Heman on 10/18/2017 at which time she was found to be in atrial fibrillation but was otherwise doing well with no further syncopal episodes. The decision was made to proceed ablation which had on 11/16/2017. She is currently on Amiodarone and Xarelto. At that visit, Dr. Rayann Heman also discussed PPM for her sick sinus syndrome; however, patient stated very clearly that she was not interested in PPM at that time.   Patient states she had a upper respiratory illness about 3 weeks ago. She started noticing some shortness of breath and fatigue on Saturday (05/28/2018) while walking around at a department store. She noticed it again at church the next day and thought she just had another upper respiratory illness because she had some nasal congestion and was coughing. She started having substernal chest pain that radiated to her left shoulder and down her left arm on Tuesday night (05/31/2018). This pain woke her up from sleep. She states it feels like she is getting repeatedly "punched" in the center of her chest. She reports associated tingling down her left arm, shortness of breath, palpitations,  lightheadedness/dizziness, and nausea with the pain. She took one sublingual nitroglycerin which she states provided some relief but did not completely resolve the pain. She already had an appointment scheduled with her PCP the next day. When she went to that appointment, she was still having chest pain so they sent the patient to the [facility] ED via EMS. She received aspirin 344m and 2 doses of nitroglycerin in the ambulance.   Upon arrival to the ED, vitals stable. EKG showed normal sinus rhythm with no acute ischemic changes. I-stat troponin negative. Chest x-ray showed no acute findings. CBC normal. Na 133, K 4.9 (hemolyzed), Glucose 203, SCr 1.84. D-dimer normal.   Currently, patient continues to note substernal chest pain that she ranks as a 6.5/10. The pain is worse with movement and with deep inspiration. She also notes some shortness of breath as well as her chronic back pain.  Patient denies any tobacco, alcohol, or drug use.    Past Medical History   Past Medical History:  Diagnosis Date  . Anxiety   . Arthritis    "knees, hands, ankles, feet" (05/12/2016)  . Asthma    Dr. WMelvyn Novas . Atrial fibrillation ([place]   . Bipolar disorder ([place]   . Chronic back pain    "lower and middle" (05/12/2016)  . Chronic kidney disease (CKD), stage III (moderate) ([place] 05/14/2015  . CKD (chronic kidney disease), stage III ([place]   . Colon polyps   . Depression   . Diverticulosis   . Epilepsy ([place]   . Essential hypertension   . Fibromyalgia   .  Gallstones   . GERD (gastroesophageal reflux disease)   . H/O hiatal hernia   . Hyperlipidemia   . IBS (irritable bowel syndrome)   . Insulin dependent diabetes mellitus ([place])   . Paroxysmal A-fib ([place])    failed medical therapy with tikosyn, s/p AF ablation x 2  . Paroxysmal A-fib ([place])    a. On [name]. b. Recurrence 08/2014 in setting of GI illness.  . Paroxysmal atrial fibrillation ([place]) 05/18/2017  . Peripheral neuropathy   . Persistent atrial  fibrillation   . Sarcoidosis of lung ([place])    Dr. Melvyn Novas  . Seizures ([place])    "epileptic; pretty regular recently" (05/12/2016)  . Syncope   . Type II diabetes mellitus ([place])   . Type II diabetes mellitus ([place])     Past Surgical History:  Procedure Laterality Date  . ABDOMINAL HYSTERECTOMY  1995  . ANTERIOR CERVICAL DECOMP/DISCECTOMY FUSION  07/11/2012   Procedure: ANTERIOR CERVICAL DECOMPRESSION/DISCECTOMY FUSION 1 LEVEL;  Surgeon: Ophelia Charter, MD;  Location: [place] NEURO ORS;  Service: Neurosurgery;  Laterality: N/A;  Cervical Five-Six Anterior Cervical Decompression with Fusion Interbody Prothesis Plating and Bonegraft  . ATRIAL FIBRILLATION ABLATION N/A 05/18/2017   Procedure: Atrial Fibrillation Ablation;  Surgeon: Thompson Grayer, MD;  Location: [place] CV LAB;  Service: Cardiovascular;  Laterality: N/A;  . ATRIAL FIBRILLATION ABLATION N/A 11/16/2017   Procedure: ATRIAL FIBRILLATION ABLATION;  Surgeon: Thompson Grayer, MD;  Location: [place] CV LAB;  Service: Cardiovascular;  Laterality: N/A;  . CARDIAC CATHETERIZATION  2012   a. Normal coronaries 2012. b. Normal nuc 09/2014.  Marland Kitchen CARDIAC CATHETERIZATION  05/18/2017  . CHOLECYSTECTOMY OPEN  1976  . CLOSED REDUCTION ANKLE FRACTURE Left 10/2006   "got steel rod in my leg; and screws"  . COLON SURGERY    . DILATION AND CURETTAGE OF UTERUS    . ESOPHAGEAL MANOMETRY  03/21/2012   Procedure: ESOPHAGEAL MANOMETRY (EM);  Surgeon: Milus Banister, MD;  Location: [facility] ENDOSCOPY;  Service: Endoscopy;  Laterality: N/A;  . FRACTURE SURGERY    . NEUROPLASTY / TRANSPOSITION MEDIAN NERVE AT CARPAL TUNNEL Left 2004  . NEUROPLASTY / TRANSPOSITION MEDIAN NERVE AT CARPAL TUNNEL Right 2002  . 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 SALPINGOOPHORECTOMY Bilateral 2000  . TEE WITHOUT CARDIOVERSION N/A 05/18/2017   Procedure: TRANSESOPHAGEAL ECHOCARDIOGRAM (TEE);  Surgeon: Lelon Perla, MD;  Location: [facility] ENDOSCOPY;  Service: Cardiovascular;  Laterality:  N/A;  . TUBAL LIGATION  1982     Allergies  Allergies  Allergen Reactions  . Prednisone Other (See Comments)    SENT PATIENT INTO A-FIB   . Amitriptyline Other (See Comments)    Made the patient disoriented  . Hydromorphone Hcl Other (See Comments)    Made the blood pressure drop (HYPOtension)  . Lisinopril Cough    Inpatient Medications    . amiodarone  100 mg Oral Daily  . amLODipine  10 mg Oral Daily  . atorvastatin  80 mg Oral QHS  . calcium carbonate  1,250 mg Oral Q breakfast  . cholecalciferol  1,000 Units Oral BID  . diclofenac sodium  2 g Topical TID AC & HS  . DULoxetine  60 mg Oral QHS  . insulin aspart  0-15 Units Subcutaneous TID WC  . insulin aspart  0-5 Units Subcutaneous QHS  . insulin glargine  30 Units Subcutaneous QHS  . mometasone-formoterol  2 puff Inhalation BID  . oxyCODONE-acetaminophen  1 tablet Oral 5 X Daily   And  .  oxyCODONE  5 mg Oral 5 X Daily  . pantoprazole  40 mg Oral Daily  . pregabalin  100 mg Oral TID  . Rivaroxaban  15 mg Oral Q supper    Family History    Family History  Problem Relation Age of Onset  . Heart attack Mother        @ age 8  . Mental illness Mother   . Diabetes Mother   . Hypertension Mother        siblings  . Alzheimer's disease Mother   . Depression Mother   . Hyperlipidemia Mother   . Heart attack Brother 3  . Alcohol abuse Brother   . Depression Brother   . Diabetes Brother   . Hyperlipidemia Brother   . Hypertension Brother   . Kidney disease Brother   . Drug abuse Brother   . Alcohol abuse Father   . Heart attack Father   . Hyperlipidemia Father   . Hypertension Father   . Colon cancer Maternal Aunt   . Prostate cancer Maternal Grandfather   . Diabetes Maternal Grandfather   . Hyperlipidemia Maternal Grandfather   . Ovarian cancer Maternal Aunt   . Diabetes Unknown   . Hypertension Unknown   . Lupus Sister   . Alcohol abuse Sister   . Depression Sister   . Diabetes Sister   .  Hyperlipidemia Sister   . Hypertension Sister   . Kidney disease Sister   . Drug abuse Sister   . Ovarian cancer Cousin   . Diabetes Maternal Grandmother   . Hyperlipidemia Maternal Grandmother   . Breast cancer Neg Hx    She indicated that her mother is deceased. She indicated that her father is deceased. She indicated that her sister is alive. She indicated that only one of her two brothers is alive. She indicated that her maternal grandmother is deceased. She indicated that her maternal grandfather is deceased. She indicated that her paternal grandmother is deceased. She indicated that her paternal grandfather is deceased. She indicated that the status of her cousin is unknown. She indicated that the status of her neg hx is unknown. She indicated that the status of her unknown relative is unknown.   Social History    Social History   Socioeconomic History  . Marital status: Divorced    Spouse name: Not on file  . Number of children: 3  . Years of education: 12th  . Highest education level: Not on file  Occupational History  . Occupation: disabled    Comment: CNA  Social Needs  . Financial resource strain: Hard  . Food insecurity:    Worry: Often true    Inability: Sometimes true  . Transportation needs:    Medical: No    Non-medical: No  Tobacco Use  . Smoking status: Never Smoker  . Smokeless tobacco: Never Used  Substance and Sexual Activity  . Alcohol use: No  . Drug use: No  . Sexual activity: Not Currently  Lifestyle  . Physical activity:    Days per week: 1 day    Minutes per session: 20 min  . Stress: To some extent  Relationships  . Social connections:    Talks on phone: More than three times a week    Gets together: Once a week    Attends religious service: More than 4 times per year    Active member of club or organization: Yes    Attends meetings of clubs or organizations: More than 4  times per year    Relationship status: Divorced  . Intimate  partner violence:    Fear of current or ex partner: Patient refused    Emotionally abused: Patient refused    Physically abused: Patient refused    Forced sexual activity: Patient refused  Other Topics Concern  . Not on file  Social History Narrative   Pt. Lives at home with her daughter. She is single, has three children, does not work currently, and has a 12th grade education level. She has never used tobacco or alcohol. Quit using illicit drugs at the age of 26 and rarely ha caffeine.     Review of Systems    Review of Systems  Constitutional: Positive for chills, diaphoresis and malaise/fatigue. Negative for fever.  HENT: Positive for congestion.   Respiratory: Positive for cough and shortness of breath. Negative for hemoptysis and sputum production.   Cardiovascular: Positive for chest pain, palpitations, orthopnea and PND. Negative for leg swelling.  Gastrointestinal: Positive for abdominal pain (history of IBS) and nausea. Negative for blood in stool, constipation and vomiting.  Genitourinary: Positive for hematuria.  Musculoskeletal: Positive for back pain (chronic) and myalgias.  Neurological: Positive for dizziness and tingling (numbness and tingling of left arm). Negative for focal weakness and loss of consciousness.  Endo/Heme/Allergies: Does not bruise/bleed easily (some bruising but no abnormal bleeding).  Psychiatric/Behavioral: Negative for substance abuse.    Physical Exam    Blood pressure 117/64, pulse 76, temperature 98 F (36.7 C), temperature source Oral, resp. rate 18, height _0  (1.676 m), weight 96.6 kg, SpO2 96 %.  General: 67 y.o. obese African-American female appears uncomfortable due to pain but in no distress. Pleasant and cooperative. Currently on supplemental O2 via nasal cannula.  HEENT: Normal  Neck: Supple. No carotid bruits or JVD appreciated. Lungs: No increased work of breathing. Clear to auscultation bilaterally. No wheezes, rhonchi, or  rales. Heart: RRR. Distinct S1 and S2. No murmurs, gallops, or rubs. Chest wall tender to palpation. Abdomen: Soft, non-distended, and non-tender to palpation. Bowel sounds present. Extremities: No lower extremity edema. Distal pedal pulses and radial pulses 2+ and equal bilaterally. Neuro: Alert and oriented x3. No focal deficits. Moves all extremities spontaneously. Psych: Normal affect.  Labs    Troponin [facility] Test) Recent Labs    06/01/18 1551  TROPIPOC 0.00   Recent Labs    06/01/18 2217 06/02/18 0453  TROPONINI <0.03 <0.03   Lab Results  Component Value Date   WBC 4.7 06/02/2018   HGB 12.1 06/02/2018   HCT 38.6 06/02/2018   MCV 91.7 06/02/2018   PLT 188 06/02/2018    Recent Labs  Lab 06/02/18 0453  NA 138  K 4.4  CL 103  CO2 23  BUN 14  CREATININE 1.64*  CALCIUM 8.9  GLUCOSE 185*   Lab Results  Component Value Date   CHOL 159 07/26/2015   HDL 37 (L) 07/26/2015   LDLCALC NOT CALC 07/26/2015   TRIG 530 (H) 07/26/2015   Lab Results  Component Value Date   DDIMER <0.27 06/01/2018     Radiology Studies    Dg Chest 2 View  Result Date: 06/01/2018 CLINICAL DATA:  This study identified as missing a report due to unplanned IT downtime at 6:30 pm on 06/01/2018. 67 year old female with chest pressure since last night. EXAM: CHEST - 2 VIEW COMPARISON:  Chest radiographs 11/23/2017 and earlier. FINDINGS: Semi upright AP and lateral views of the chest. Lung volumes and mediastinal  contours remain normal. Visualized tracheal air column is within normal limits. Both lungs appear clear. No pneumothorax or pleural effusion. Negative visible bowel gas pattern. No acute osseous abnormality identified. Partially visible cervical ACDF. IMPRESSION: No acute cardiopulmonary abnormality. Electronically Signed   By: Genevie Ann M.D.   On: 06/01/2018 18:32   Dg Shoulder Left  Result Date: 06/02/2018 CLINICAL DATA:  Left shoulder pain.  Pain for 1 week.  No injury. EXAM:  LEFT SHOULDER - 2+ VIEW COMPARISON:  08/24/2016 FINDINGS: There is no evidence of fracture or dislocation. There is no evidence of arthropathy or other focal bone abnormality. Soft tissues are unremarkable. Postoperative changes in the cervical spine. IMPRESSION: Negative. Electronically Signed   By: Lucienne Capers M.D.   On: 06/02/2018 06:05    EKG     EKG: EKG was personally reviewed and demonstrates: normal sinus rhythm, rate 90 bpm, with no acute ST changes.  Telemetry: Telemetry was personally reviewed and demonstrates: normal sinus rhythm, rate ranging between 70s and 90s, with PACs.  Cardiac Imaging    Echocardiogram 11/23/2017: Study Conclusions: - Left ventricle: The cavity size was normal. Wall thickness was   normal. Systolic function was normal. The estimated ejection   fraction was in the range of 60% to 65%. Wall motion was normal;   there were no regional wall motion abnormalities. The study is   not technically sufficient to allow evaluation of LV diastolic   function. - Mitral valve: Mildly thickened leaflets . There was trivial   regurgitation. - Left atrium: The atrium was normal in size. - Inferior vena cava: The vessel was dilated. The respirophasic   diameter changes were blunted (< 50%), consistent with elevated   central venous pressure.  Impressions: - Compared to a prior study in 2018, the LVEF is higher at 60-65%.  Assessment & Plan    1. Atypical Chest Pain - Patient presented with substernal chest pain with radiation to left arm that started on 05/31/2018. Patient continues to have patient now and notes worsening pain with movement and deep inspiration.  - EKG showed no acute ischemic changes. - Troponin negative x3.   - Echo on 11/23/2017 showed LVEF of 60-65% with no wall motion abnormality.  - Will repeat Echo today to look for wall motion abnormalities and any pericardial conditions.   2. Paroxysmal Atrial Fibrillation s/p Ablation  - Patient had  ablation in 11/2017.  - Patient currently in normal sinus rhythm on telemetry. - Continue Amiodarone '100mg'$  daily. - Continue Xarelto '15mg'$  daily.   3. Hypertension - Well controlled at this time. BP 117/64 this morning. - Continue Amlodipine '10mg'$  daily. - Home Irbesartan has been held due to renal function.   4. Hyperlipidemia - Continue Lipitor '80mg'$  daily.   5. Type 2 Diabetes Mellitus - Continue SSI per Internal Medicine   6. Acute on Chronic Kidney DiseaseStage III - Improving. Scr 1.64 today (1.84 on admission). - Continue to monitor renal function daily. - Per Internal Medicine  7. Asthma - Currently on supplemental O2 - Continue nebulizers/inhalers per Internal Medicine  Signed, Darreld Mclean, PA-C 06/02/2018, 7:28 AM  For questions or updates, please contact   Please consult [email or web address] for contact info under Cardiology/STEMI.  -----------------------------   History and all data above reviewed.  Patient examined.  I agree with the findings as above.  Honest Safranek Meckes describes pleuritic chest pain worse with movement and deep inspiration. She has ruled out for MI. Echocardiogram performed today.  Constitutional: No  acute distress Eyes: pupils equally round and reactive to light, sclera non-icteric, normal conjunctiva and lids ENMT: normal dentition, moist mucous membranes Cardiovascular: regular rhythm, normal rate, no murmurs. S1 and S2 normal. Radial pulses normal bilaterally. No jugular venous distention.  Respiratory: clear to auscultation bilaterally GI : normal bowel sounds, soft and nontender. No distention.   MSK: extremities warm, well perfused. No edema.  LYMPH: No lymphadenopathy noted of the head and neck NEURO: grossly nonfocal exam, moves all extremities. PSYCH: alert and oriented x 3, normal mood and affect.   All available labs, radiology testing, previous records reviewed. Agree with documented assessment and plan of my colleague as stated  above with the following additions or changes:  Active Problems:   Atypical chest pain   Acute pericarditis   Plan: Her echocardiogram has not been formally read, but my personal review reveals a hyperdynamic LV with no regional wall motion abnormalities, no pericardial effusion, and no clear respiratory related hemodynamic findings to suggest constriction.   Her symptoms seem most consistent with acute pericarditis, with pleuritic chest pain.*correction to previous documentation - ESR not obtained during this hospitalization.  Usual recommend treatment is colchicine 0.6 mg BID for 3 months and ibuprofen 800 mg TID for 1 month. However wih her renal dysfunction, she could take colchicine 0.3 mg BID or once daily and avoid NSAIDS. Therapy can be continued if symptoms persist. She should follow up with cardiology in 1 month to reassess symptoms.   [facility] will sign off.   Medication Recommendations:  Colchicine 0.3 mg BID if tolerated from renal function standpoint, can be dose adjusted to once daily.  Other recommendations (labs, testing, etc):  none Follow up as an outpatient:  1 month with cardiology, in 1 week with PCP.   Elouise Munroe, MD [facility] 3:22 PM  06/02/2018

## 2018-06-02 NOTE — Progress Notes (Addendum)
Family Medicine Teaching Service Daily Progress Note Intern Pager: [phone]  Patient name: [name] Medical record number: [id number] Date of birth: 12-24-1950 Age: 67 y.o. Gender: female  Primary Care Provider: Leeroy Bock, DO Consultants: Cardiology Code Status: Full  Pt Overview and Major Events to Date:  10/16 Admitted to FPTS  Assessment and Plan: [name] is a 67 y.o. female presenting with atypical chest pain. PMH is significant for IDT2DM, HDL, HTN, Asthma, GERD, A-fib on xarelto s/p ablation (02/2018), Sarcoidosis, CKD3.  Atypical Chest Pain Trop negative x2.  TSH 1.676.  EKG this AM unchanged from previous.  Suspect MSK related given negative cardiac workup at present and history.  Patient continues to complain of left sided chest pain inferior to her breast that radiates to her left shoulder, reproducible to palpation, worsens with positional changes.  Some SOB, on 2L per [place] at present, 98% SpO2. - f/u troponin - f/u cardiology consult - Continuous pulse ox - supplemental O2 prn, wean to RA as able - K-pad - Home oxy-Acetamenaphen 10-325mg  for pain - Continue home Lipitor 80 mg daily - ambulate with pulse ox - likely d/c today  Left Shoulder Pain Continues to have left shoulder pain that radiates from chest. XR negative.  Denies injury to the area.  Likely referred pain. - cont home pain medication - K pad prn  Acute kidney injury  CKDIIIa: Improving Cr 1.84>1.64 (BL 1.0-1.3). Received 1L IV fluids. Will likely improve with PO intake as was decreased due to recent illness. - cont to monitor BMP - Avoid nephrotoxic agents, holding home irbesartan   History of atrial-fibrillation s/p Ablation - stable Ablation in October and April 2019. Stable without recurrence. On home Xarelto and Amiodarone 100mg . EKG this AM is pending.  Prolonged QTc (496) on EKG on admission, improved to 421 this AM. - continue home Xarelto and Amiodarone - Continue to  monitor closely - Avoid meds that prolong QT  Insulin-dependant Diabetes Mellitus type 2  A1c 9.0.  CBG this AM 221.  On home Humolog SSI and Lantus 60 U (though med chart reflects 50 U daily) - Cont Lantus 30U - Cont SSI  - Continue to montior CBG, will adjust as needed  Chronic Low Back Pain - requiring opioids  On home Oxycodone-Acetamenaphen 10-325mg  1 po 5 times per day.  - cont home oxy  HTN: stable On home Amlodipine and Irbesartan.  BP 142/75 at 0920. - Continue Amlodipine 10mg  - Holding Irbesartan due to AKI - Continue to monitor - Consider restarting Irbesartan at discharge or upon resolution of AKI  HLD: chronic Last Lipid Panel 11/2016. - consider Lipid Panel while inpatient if here tomorrow, otherwise, repeat as outpatient - Continue home Atorvastatin 80mg   Asthma with COPD overlap - stable Lungs clear on exam.  On 2L per [place] with 98% sats. - Continue Albuterol q2 PRN - Continue home Dulera  IBS with Diarrhea: chronic Has had recent watery diarrhea. Will hold home Lomotil at this time. Consider adding if symptomatic. Not currently on Imodium. - Hold home Lomotil  Peripheral Neuropathy: Chronic. Stable. - Continue Cymbalta 60mg  and Lyrica 100mg    GERD On home Nexium. Atypical chest pain not c/w GERD per patient on admission. - cont Protonix   History of orthostatic hypotension On home tamsulosin.  - Hold tamsulosin   Sarcoidosis: stable Dx'd based on x-ray findings of lymphadenopathy, negative transbronchial bx. Has a chronic cough. Followed by Dr. Isaiah [name]. -Continue to monitor  Morbid obesity BMI 34.38 -  Recommend discussion of life-style modifications with PCP as outpatient   FEN/GI: heart healthy carb modified PPx: xarelto  Disposition: likely d/c today  Subjective:  Patient continues to complain of chest pain that radiates to her left shoulder and chronic low back pain.  She states that the chest pain is worse with palpation and  positional changes.  She notes some shortness of breath prior to O2 supplementation, but none at present.  Denies other complaints.  Objective: Temp:  [97.8 F (36.6 C)-98.2 F (36.8 C)] 98 F (36.7 C) (10/17 0920) Pulse Rate:  [70-88] 74 (10/17 0920) Resp:  [12-26] 18 (10/17 0920) BP: (102-147)/(62-81) 142/75 (10/17 0920) SpO2:  [91 %-100 %] 98 % (10/17 0920) Weight:  [96.6 kg] 96.6 kg (10/16 2124)  Physical Exam: General: 67 y.o. female in NAD Cardio: RRR no m/r/g Lungs: CTAB, no wheezing, no rhonchi, no crackles, no increased work of breathing, on 2L per [place] Abdomen: Soft, non-tender to palpation, positive bowel sounds Skin: warm and dry Extremities: No edema   Laboratory: Recent Labs  Lab 06/01/18 1546 06/02/18 0453  WBC 5.0 4.7  HGB 13.2 12.1  HCT 41.7 38.6  PLT 197 188   Recent Labs  Lab 06/01/18 1546 06/02/18 0453  NA 133* 138  K 4.9 4.4  CL 102 103  CO2 19* 23  BUN 21 14  CREATININE 1.84* 1.64*  CALCIUM 9.2 8.9  GLUCOSE 203* 185*     Imaging/Diagnostic Tests: Dg Chest 2 View  Result Date: 06/01/2018 CLINICAL DATA:  This study identified as missing a report due to unplanned IT downtime at 6:30 pm on 06/01/2018. 67 year old female with chest pressure since last night. EXAM: CHEST - 2 VIEW COMPARISON:  Chest radiographs 11/23/2017 and earlier. FINDINGS: Semi upright AP and lateral views of the chest. Lung volumes and mediastinal contours remain normal. Visualized tracheal air column is within normal limits. Both lungs appear clear. No pneumothorax or pleural effusion. Negative visible bowel gas pattern. No acute osseous abnormality identified. Partially visible cervical ACDF. IMPRESSION: No acute cardiopulmonary abnormality. Electronically Signed   By: Odessa Fleming M.D.   On: 06/01/2018 18:32   Dg Shoulder Left  Result Date: 06/02/2018 CLINICAL DATA:  Left shoulder pain.  Pain for 1 week.  No injury. EXAM: LEFT SHOULDER - 2+ VIEW COMPARISON:  08/24/2016  FINDINGS: There is no evidence of fracture or dislocation. There is no evidence of arthropathy or other focal bone abnormality. Soft tissues are unremarkable. Postoperative changes in the cervical spine. IMPRESSION: Negative. Electronically Signed   By: Burman Nieves M.D.   On: 06/02/2018 06:05    Monish Haliburton, Solmon Ice, DO 06/02/2018, 9:33 AM PGY-1, [place] [facility] FPTS Intern pager: [phone], text pages welcome

## 2018-06-02 NOTE — Progress Notes (Signed)
While rounding on pt, pt had stated that her left shoulder was hurting, Rn noticed the are was more swollen than other shoulder. RN will notify MD.

## 2018-06-02 NOTE — Progress Notes (Signed)
MD returned page and orders for xray of the shoulder and home med Voltaren medication will be ordered.

## 2018-06-02 NOTE — Progress Notes (Signed)
  Echocardiogram 2D Echocardiogram has been performed.  [name] 06/02/2018, 11:05 AM

## 2018-06-02 NOTE — Progress Notes (Signed)
Have noted that cardiology recommends colchicine for possible pericarditis diagnosis.  Paged cardiology NP listed in amion twice with no response, as Dr. Alveda Reasons referenced an ESR in her note that is not in the chart.  Unsure of this diagnosis, will continue following.  [place] Constable, D.O.  PGY-1 Family Medicine  06/02/2018 7:23 PM

## 2018-06-03 DIAGNOSIS — N179 Acute kidney failure, unspecified: Secondary | ICD-10-CM | POA: Diagnosis not present

## 2018-06-03 DIAGNOSIS — R0789 Other chest pain: Secondary | ICD-10-CM | POA: Diagnosis not present

## 2018-06-03 LAB — CBC
HCT: 38 % (ref 36.0–46.0)
Hemoglobin: 12.1 g/dL (ref 12.0–15.0)
MCH: 28.7 pg (ref 26.0–34.0)
MCHC: 31.8 g/dL (ref 30.0–36.0)
MCV: 90.3 fL (ref 80.0–100.0)
NRBC: 0 % (ref 0.0–0.2)
Platelets: 150 10*3/uL (ref 150–400)
RBC: 4.21 MIL/uL (ref 3.87–5.11)
RDW: 12.8 % (ref 11.5–15.5)
WBC: 3.7 10*3/uL — ABNORMAL LOW (ref 4.0–10.5)

## 2018-06-03 LAB — BASIC METABOLIC PANEL
ANION GAP: 9 (ref 5–15)
BUN: 14 mg/dL (ref 8–23)
CALCIUM: 9.3 mg/dL (ref 8.9–10.3)
CO2: 23 mmol/L (ref 22–32)
CREATININE: 1.55 mg/dL — AB (ref 0.44–1.00)
Chloride: 104 mmol/L (ref 98–111)
GFR calc non Af Amer: 34 mL/min — ABNORMAL LOW (ref >60)
GFR, EST AFRICAN AMERICAN: 39 mL/min — AB (ref >60)
Glucose, Bld: 169 mg/dL — ABNORMAL HIGH (ref 70–99)
Potassium: 4.2 mmol/L (ref 3.5–5.1)
SODIUM: 136 mmol/L (ref 135–145)

## 2018-06-03 LAB — GLUCOSE, CAPILLARY
Glucose-Capillary: 196 mg/dL — ABNORMAL HIGH (ref 70–99)
Glucose-Capillary: 174 mg/dL — ABNORMAL HIGH (ref 70–99)

## 2018-06-03 MED ORDER — DICLOFENAC SODIUM 1 % TD GEL: TRANSDERMAL | 0 refills | Status: AC

## 2018-06-03 NOTE — Progress Notes (Addendum)
S: Paged by nursing that patient had near-syncopal event while walking with PT.  Patient seen and examined at beside at 1220.  Patient noted that she had stood and walked to the hall when she started to feel very weak and dizzy.  No LOC, no fall reported. Resolved when she was sat in a chair.  Patient continuing to complain of chest pain, unchanged from presentation.  Physical Exam: General: 67 y.o. female lying supine in chair HEENT: PERRL, EOMI Cardio: RRR no m/r/g Lungs: CTAB, no wheezing, no rhonchi, no crackles, no increased work of breathing Skin: warm and dry Extremities: No edema, 5/5 strength BUE/BLE Neuro: CN II-XII intact, patient notes "funny" sensation on right on right side of face in all trigeminal distributions as well as in RUE and RLE, patient somewhat moaning throughout exam and is easily distracted during which she converses appropriately, physical exam findings do not correlate with her behavior  A/P: - Spoke with Dr. Jacques Navy, who was at bedside when presented to room.  She states she reviewed telemetry during episode of weakness and saw no signs of arrhythmia to coordinate.  Patient has a history of A Fib and is chronically anticoagulated on Xarelto, making a stroke very unlikely.  Patient also had no history of trauma and BP has been well controlled without headache, doubt intracranial bleeding.  Her neurologic exam is not suggestive of an acute event given distribution of "funny" sensation and her physical exam findings do not correlate with behavior.  - no need for imaging, Dr. Jennette Kettle updated and agrees - orthostatic vitals, which were negative - patient is stable for discharge and will be updated on this plan  - PT recommended SNF for patient given this episode.  Given my evaluation, patient does not require SNF placement and HH PT would be appropriate with close follow up as outpatient.   - Will discharge patient home today - Patient is scheduled for follow up at [facility] 10/21  at 1:30pm and has agreed to go to this appointment. - Patient given appropriate return to care precautions.

## 2018-06-03 NOTE — Progress Notes (Signed)
Pt near-syncopal with PT in hallway during PT assessment @11 :45am this date. Pt assisted to chair in hallway, significant decline in cognition and alertness, but no LOC noted. Pt/PT assisted by other rehab/nursing staff, vitals assessed, RN made aware, and pt return to room supine in recliner.   11:58 AM, 06/03/18 Rosamaria Lints, PT, DPT Physical Therapist - [place] [phone] (Pager)  [phone] (Office)

## 2018-06-03 NOTE — Care Management Note (Addendum)
Case Management Note  Patient Details  Name: [name] MRN: [id number] Date of Birth: [birth year]  Subjective/Objective:  67yo female presented with chest pain.                Action/Plan: CM met with patient to discuss transitional needs. Patient lives at home alone, has support from daughter, utilized a cane/RW for ambulation. PCP verified as: Dr. Doristine Mango; pharmacy of choice: [facility], [facility]. PT eval completed with ST SNF recommended, with SW consulted for placement. CM will continue to follow.   Expected Discharge Date:  06/03/18               Expected Discharge Plan:  [place]  In-House Referral:  Clinical Social Work  Discharge planning Services  CM Consult  Post Acute Care Choice:  Home Health Choice offered to:  Patient  DME Arranged:  N/A DME Agency:  NA  HH Arranged:  PT [place] Agency:  [place]  Status of Service:  Completed, signed off  If discussed at [place] of Stay Meetings, dates discussed:    Additional Comments: 06/03/18 @ 1441-Mandeep Ferch RNCM-Call received from Dr. Sandi Carne stating patient is stable to transition home with HHPT. CM met with patient to discuss POC, with patient agreeable to [facility] services. HH preference given with [facility] selected. [place] referral given to [facility] liaison, with AVS updated. Patient indicated her friend would provide transportation home. No further CM needs.   Midge Minium RN, BSN, NCM-BC, ACM-RN [phone] 06/03/2018, 12:28 PM

## 2018-06-03 NOTE — Progress Notes (Signed)
Family Medicine Teaching Service Daily Progress Note Intern Pager: [phone]  Patient name: [name] Medical record number: [id number] Date of birth: [birth year] Age: 67 y.o. Gender: female  Primary Care Provider: Leeroy Bock, DO Consultants: Cardiology Code Status: Full  Pt Overview and Major Events to Date:  10/16 Admitted to FPTS  Assessment and Plan: [name] is a 67 y.o. female presenting with atypical chest pain. PMH is significant for IDT2DM, HDL, HTN, Asthma, GERD, A-fib on xarelto s/p ablation (02/2018), Sarcoidosis, CKD3.  Atypical Chest Pain Echo EF 65-70% without wall of valvular abnormalities.  Cardiology consulted, recommend treatment for Pericarditis given clinical picture.  Recommend colchicine renally dose to 0.3mg  BID or QD for 3 months and avoidance of NSAIDs.  Recommend outpatient follow up with cardiology in 1 month and close f/u with PCP.  Ambulated with pulse ox yesterday with sats 95-99% on RA, per nursing note, patient experienced unsteady gait and dizziness and was placed back in bed with 2L O2. This AM notes that pain improved from yesterday, but still reproducible to palpation and worsens with movement, including leaning forward.  She does note weakness when standing and walking. Patient follows very closely with family medicine clinic and given that clinically it is not clear she has pericarditis at this time, her chest pain is not improved by leaning forward, she has no rub on exam, and Echo did not show pericardial effusion, will hold off on treatment.  Believe this pain to be MSK in origin given TTP.  She will have close follow up in clinic and if chest pain does not improve, will consider starting colchicine. - cardiology consulted, appreciate recs - cardiac monitoring - continuous pulse ox - K-pad - Home oxy-Acetamenaphen 10-325mg  for pain - Continue home Lipitor 80 mg daily - wean O2 to RA as pulse ox allows - ambulate with pulse ox  today - consult PT - voltaren gel TID  - f/u with PCP within the week  Left Shoulder Pain Continues to radiate from chest, non-TTP.  Likely 2/2 chest pain. - cont home pain medication - K pad prn  Acute kidney injury  CKDIIIa: Improving Cr 1.84>1.64>1.55 (BL 1.0-1.3). Improving with PO intake. - cont to monitor BMP - Avoid nephrotoxic agents, holding home irbesartan   History of atrial-fibrillation s/p Ablation - stable Ablation in October and April 2019. Stable without recurrence. On home Xarelto and Amiodarone 100mg .  - continue home Xarelto and Amiodarone - Continue to monitor closely - cardiac monitoring  Insulin-dependant Diabetes Mellitus type 2  A1c 9.0.  CBG 169 at 0407 this AM.  On home Humolog SSI and Lantus 60 U (though med chart reflects 50 U daily) - Cont Lantus 30U - Cont SSI  - Continue to montior CBG, will adjust as needed  Chronic Low Back Pain - requiring opioids  On home Oxycodone-Acetamenaphen 10-325mg  1 po 5 times per day.  - cont home oxy  HTN: stable On home Amlodipine and Irbesartan.  BP 143/73 at 0556. - Continue Amlodipine 10mg  - Holding Irbesartan due to AKI - Continue to monitor - Consider restarting Irbesartan at discharge  HLD: chronic Last Lipid Panel 11/2016. - consider Lipid Panel while inpatient if here tomorrow, otherwise, repeat as outpatient - Continue home Atorvastatin 80mg   Asthma with COPD overlap - stable Lungs clear on exam.  On RA with O2 sats 94%. - Continue Albuterol q2 PRN - Continue home Dulera  IBS with Diarrhea: chronic Has had recent watery diarrhea. Will hold home  Lomotil at this time. Consider adding if symptomatic. Not currently on Imodium. - Hold home Lomotil  Peripheral Neuropathy: Chronic. Stable. - Continue Cymbalta 60mg  and Lyrica 100mg    GERD On home Nexium. Atypical chest pain not c/w GERD per patient on admission. - cont Protonix   History of orthostatic hypotension On home tamsulosin.   - Hold tamsulosin   Sarcoidosis: stable Dx'd based on x-ray findings of lymphadenopathy, negative transbronchial bx. Has a chronic cough. Followed by Dr. Isaiah Serge. -Continue to monitor  Morbid obesity BMI 34.38 -Recommend discussion of life-style modifications with PCP as outpatient   FEN/GI: heart healthy carb modified PPx: xarelto  Disposition: d/c today  Subjective:  Patient continues to complain of left sided chest pain, although does state that it is improved from yesterday.  Continues to worsen, and leaning forward.  Patient notes an episode of weakness yesterday after walking around the room when showering.  She states that she is concerned about going home due to her weakness.  She denies shortness of breath.  Objective: Temp:  [97.6 F (36.4 C)-98.6 F (37 C)] 98.3 F (36.8 C) (10/18 0556) Pulse Rate:  [63-80] 63 (10/18 0556) Resp:  [16-18] 16 (10/18 0556) BP: (129-150)/(70-81) 143/73 (10/18 0556) SpO2:  [94 %-99 %] 94 % (10/18 0556)  Physical Exam: General: 67 y.o. female in NAD Cardio: RRR no m/r/g, left chest wall TTP Lungs: CTAB, no wheezing, no rhonchi, no crackles Abdomen: Soft, non-tender to palpation, positive bowel sounds Skin: warm and dry Extremities: No edema, no TTP of right shoulder    Laboratory: Recent Labs  Lab 06/01/18 1546 06/02/18 0453 06/03/18 0746  WBC 5.0 4.7 3.7*  HGB 13.2 12.1 12.1  HCT 41.7 38.6 38.0  PLT 197 188 150   Recent Labs  Lab 06/01/18 1546 06/02/18 0453 06/03/18 0407  NA 133* 138 136  K 4.9 4.4 4.2  CL 102 103 104  CO2 19* 23 23  BUN 21 14 14   CREATININE 1.84* 1.64* 1.55*  CALCIUM 9.2 8.9 9.3  GLUCOSE 203* 185* 169*     Imaging/Diagnostic Tests: No results found.  Meccariello, Solmon Ice, DO 06/03/2018, 9:16 AM PGY-1, [place] [facility] FPTS Intern pager: [phone], text pages welcome

## 2018-06-03 NOTE — Evaluation (Signed)
Physical Therapy Evaluation Patient Details Name: [name] MRN: [id number] DOB: Jan 14, 1951 Today's Date: 06/03/2018   History of Present Illness  [name] is a 67yo female who comes to [facility] after having difficult breathing and chest pain. PMH: IDDM2, HLD, HTN, asthma, GERD, AF s/p ablation, sarcoidosis, CKD3.   Clinical Impression  Pt admitted with above diagnosis. Pt currently with functional limitations due to the deficits listed below (see "PT Problem List"). Upon entry, pt in bed, no family/caregiver present. The pt is drowsy and agreeable to participate. Pt reports frequent drowsiness d/t chronic sleep disruption d/t chronic pain problems. The pt is alert and oriented x3, pleasant, conversational, and following simple commands consistently. Bed mobility and transfers require high effort and additional time, but no physical assist. After 61ft AMB, pt has sudden decline in gait potential, balance, mentation, equilibrium, and eventually is lowered into a chair as her legs gave out. Rapid response is called, PT then assisted by other rehab/nursing staff, vitals assessed, pt transfers to recliner (from guest chair) to make supine then moved back to room. No significant improvement with patient presentation was noted over the next 5 minutes prior to exit: pt left in care of nursing. VSS, pt c HTN, normal saturations, HR WNL. Functional mobility assessment demonstrates increased effort/time requirements, poor tolerance, and need for physical assistance, whereas the patient performed these at a higher level of independence PTA. Pt will benefit from skilled PT intervention to increase independence and safety with basic mobility in preparation for discharge to the venue listed below.         Follow Up Recommendations SNF;Supervision/Assistance - 24 hour;Supervision for mobility/OOB    Equipment Recommendations  None recommended by PT    Recommendations for Other Services        Precautions / Restrictions Precautions Precautions: None Precaution Comments: 2 near syncopal episodes since arrival.  Restrictions Weight Bearing Restrictions: No      Mobility  Bed Mobility Overal bed mobility: Needs Assistance Bed Mobility: Supine to Sit     Supine to sit: Supervision     General bed mobility comments: additional time needed, moderate effort, pain limited   Transfers Overall transfer level: Needs assistance   Transfers: Sit to/from Stand Sit to Stand: Supervision         General transfer comment: additional time needed, moderate effort, pain limited, heavy BUE use needed  Ambulation/Gait Ambulation/Gait assistance: Min guard;Min assist;+2 physical assistance(minguard into hallway, +2 total assist once near-syncopal ) Gait Distance (Feet): 15 Feet Assistive device: Rolling walker (2 wheeled)       General Gait Details: gradually becoming ataxic and side based, vocalized dizziness, then decline in mentation, slurred speach, lethargy, pt lower to chair as legs buckling.   Stairs            Wheelchair Mobility    Modified Rankin (Stroke Patients Only)       Balance Overall balance assessment: (pt reports long history of syncopal episodes albeit infrequently )                                           Pertinent Vitals/Pain Pain Assessment: 0-10 Pain Score: 7  Pain Location: Left shoulder and low back pain:  Pain Intervention(s): Limited activity within patient's tolerance;Monitored during session    Home Living Family/patient expects to be discharged to:: Private residence Living Arrangements: Alone Available Help at Discharge: (  uses medical transport, DTR works unable to help ) Type of Home: House Home Access: Stairs to enter Entrance Stairs-Rails: Lawyer of Steps: 5 Home Layout: One level Home Equipment: Cane - quad;Shower seat;Walker - 2 wheels      Prior Function Level of  Independence: Needs assistance   Gait / Transfers Assistance Needed: AMB c SPC household distances typically, swometimes limited to less d/t pain.   ADL's / Homemaking Assistance Needed: Daughter and grandchildren assist with showering (transfers and supervise bathing). sometimes needs help with bathing dreswsing d/t pain         Hand Dominance   Dominant Hand: Right    Extremity/Trunk Assessment                Communication   Communication: No difficulties  Cognition Arousal/Alertness: Awake/alert Behavior During Therapy: WFL for tasks assessed/performed Overall Cognitive Status: Within Functional Limits for tasks assessed                                        General Comments      Exercises     Assessment/Plan    PT Assessment Patient needs continued PT services  PT Problem List Decreased activity tolerance;Decreased strength;Decreased mobility       PT Treatment Interventions Gait training;Stair training;Functional mobility training;Therapeutic activities;Patient/family education    PT Goals (Current goals can be found in the Care Plan section)  Acute Rehab PT Goals Patient Stated Goal: decreased her pain to allow greater activity tolerance at home PT Goal Formulation: With patient Time For Goal Achievement: 06/10/18 Potential to Achieve Goals: Poor    Frequency Min 3X/week   Barriers to discharge Decreased caregiver support      Co-evaluation               AM-PAC PT "6 Clicks" Daily Activity  Outcome Measure Difficulty turning over in bed (including adjusting bedclothes, sheets and blankets)?: Unable Difficulty moving from lying on back to sitting on the side of the bed? : Unable Difficulty sitting down on and standing up from a chair with arms (e.g., wheelchair, bedside commode, etc,.)?: Unable Help needed moving to and from a bed to chair (including a wheelchair)?: A Lot Help needed walking in hospital room?: Total Help  needed climbing 3-5 steps with a railing? : Total 6 Click Score: 7    End of Session Equipment Utilized During Treatment: Gait belt;Oxygen(received on O2, but satting WNL, left on RA  ) Activity Tolerance: Treatment limited secondary to medical complications (Comment);Patient limited by lethargy(pt near syncopal in hallway, lowered to chair, returned to room, left in care of nursing. ) Patient left: in chair;with call bell/phone within reach;with nursing/sitter in room(fully reclined, feet elevated) Nurse Communication: Mobility status PT Visit Diagnosis: Unsteadiness on feet (R26.81);Dizziness and giddiness (R42);Other symptoms and signs involving the nervous system (R29.898)    Time: 1130-1155 PT Time Calculation (min) (ACUTE ONLY): 25 min   Charges:   PT Evaluation $PT Eval High Complexity: 1 High          12:17 PM, 06/03/18 Rosamaria Lints, PT, DPT Physical Therapist - [place] [phone] (Pager)  [phone] (Office)      Buccola,Allan C 06/03/2018, 12:10 PM

## 2018-06-03 NOTE — Discharge Instructions (Signed)
Please make sure you follow up at the clinic on Monday. Call or go to the ED if your pain worsens or you become short of breath.  Continue to use your voltaren gel three times a day for pain.  You can use it on your chest or back.  Chest Wall Pain Chest wall pain is pain in or around the bones and muscles of your chest. Sometimes, an injury causes this pain. Sometimes, the cause may not be known. This pain may take several weeks or longer to get better. Follow these instructions at home: Pay attention to any changes in your symptoms. Take these actions to help with your pain:  Rest as told by your doctor.  Avoid activities that cause pain. Try not to use your chest, belly (abdominal), or side muscles to lift heavy things.  If directed, apply ice to the painful area: ? Put ice in a plastic bag. ? Place a towel between your skin and the bag. ? Leave the ice on for 20 minutes, 2-3 times per day.  Take over-the-counter and prescription medicines only as told by your doctor.  Do not use tobacco products, including cigarettes, chewing tobacco, and e-cigarettes. If you need help quitting, ask your doctor.  Keep all follow-up visits as told by your doctor. This is important.  Contact a doctor if:  You have a fever.  Your chest pain gets worse.  You have new symptoms. Get help right away if:  You feel sick to your stomach (nauseous) or you throw up (vomit).  You feel sweaty or light-headed.  You have a cough with phlegm (sputum) or you cough up blood.  You are short of breath. This information is not intended to replace advice given to you by your health care provider. Make sure you discuss any questions you have with your health care provider. Document Released: 01/20/2008 Document Revised: 01/09/2016 Document Reviewed: 10/29/2014 Elsevier Interactive Patient Education  2018 [employer].  =========================================================================================================  Information on my medicine - XARELTO (Rivaroxaban)  Why was Xarelto prescribed for you? Xarelto was prescribed for you to reduce the risk of a blood clot forming that can cause a stroke if you have a medical condition called atrial fibrillation (a type of irregular heartbeat).  What do you need to know about xarelto ? Take your Xarelto ONCE DAILY at the same time every day with your evening meal. If you have difficulty swallowing the tablet whole, you may crush it and mix in applesauce just prior to taking your dose.  Take Xarelto exactly as prescribed by your doctor and DO NOT stop taking Xarelto without talking to the doctor who prescribed the medication.  Stopping without other stroke prevention medication to take the place of Xarelto may increase your risk of developing a clot that causes a stroke.  Refill your prescription before you run out.  After discharge, you should have regular check-up appointments with your healthcare provider that is prescribing your Xarelto.  In the future your dose may need to be changed if your kidney function or weight changes by a significant amount.  What do you do if you miss a dose? If you are taking Xarelto ONCE DAILY and you miss a dose, take it as soon as you remember on the same day then continue your regularly scheduled once daily regimen the next day. Do not take two doses of Xarelto at the same time or on the same day.   Important Safety Information A possible side effect of  Xarelto is bleeding. You should call your healthcare provider right away if you experience any of the following: ? Bleeding from an injury or your nose that does not stop. ? Unusual colored urine (red or dark brown) or unusual colored stools (red or black). ? Unusual bruising for unknown reasons. ? A serious fall or if you hit your head (even if there  is no bleeding).  Some medicines may interact with Xarelto and might increase your risk of bleeding while on Xarelto. To help avoid this, consult your healthcare provider or pharmacist prior to using any new prescription or non-prescription medications, including herbals, vitamins, non-steroidal anti-inflammatory drugs (NSAIDs) and supplements.  This website has more information on Xarelto: [email or web address].com.br.

## 2018-06-06 ENCOUNTER — Telehealth: Payer: Self-pay

## 2018-06-06 ENCOUNTER — Inpatient Hospital Stay: Payer: Medicare Other

## 2018-06-06 NOTE — Telephone Encounter (Signed)
Tomma Lightning, PT with [facility], called for verbal orders:  HH PT 2x/week x 3 weeks, then 1x/week x 1 week for education, strengthening, endurance, gait training, and safety.  Also wanted to let you know patient BP while sitting was 142/70 and when she stood dropped to 118/70.  Call back is [phone].  Ples Specter, RN [facility] RN)

## 2018-06-07 NOTE — Telephone Encounter (Signed)
Hello, I have tried to call the PT but did not answer. I will try again.

## 2018-06-08 ENCOUNTER — Encounter: Payer: Self-pay | Admitting: Diagnostic Neuroimaging

## 2018-06-08 ENCOUNTER — Encounter

## 2018-06-08 ENCOUNTER — Ambulatory Visit (INDEPENDENT_AMBULATORY_CARE_PROVIDER_SITE_OTHER): Payer: Medicare Other | Admitting: Diagnostic Neuroimaging

## 2018-06-08 VITALS — BP 122/68 | HR 80 | Ht 66.5 in | Wt 225.2 lb

## 2018-06-08 DIAGNOSIS — R569 Unspecified convulsions: Secondary | ICD-10-CM | POA: Insufficient documentation

## 2018-06-08 DIAGNOSIS — R55 Syncope and collapse: Secondary | ICD-10-CM | POA: Diagnosis not present

## 2018-06-08 NOTE — Progress Notes (Signed)
PATIENT: [name] DOB: 1951/01/29  HISTORY FROM: patient REASON FOR VISIT: follow up   Chief Complaint  Patient presents with  . Seizures    rm 7, "was having spells that I'd pass out- syncope, now I get swimmy-headed, dbl vision"    HISTORY OF PRESENT ILLNESS:  UPDATE (11/22/17, VRP): Since last visit, had admission in April 2019 for recurrent syncope / LOC. EEG was normal. Last syncope attacks in April 2019. No alleviating or aggravating factors. Tolerating meds.  UPDATE 12/21/16: Since last visit, had [facility] eval and VEEG, and was diagnosed with non-epileptic spells (likely patient having syncope / pre syncope spells instead) and has been off levetiracetam. Last syncope attack was in April 2018.   UPDATE 06/12/16 (VRP): Since last visit, has been in hospital x 3 in the last year. Went to hospital in Aug 2017 for syncope/seizure, and LEV was increase to 500mg  in AM and 2000mg  in PM. Then another syncope/seizure in Sept 2017, and LEV was kept the same. Also with intermittent "neck jerking" without loss of consciousness.  UPDATE 06/11/15 (VRP): No seizures since May 2015. Since last visit, was doing well until Jan 2016, then had hypotension, syncope, bradycardia, and 6 day admission.  Also had multiple ER evaluations for chest pain and other issues.   UPDATE 04/30/14 (VRP): Since last visit, was doing well until May 2015 (had generalized convulsive seizure) then 1 week last went to hospital and dx'd with afib. Now doing better. Not driving.   UPDATE 10/27/13 (LL): Patient returns for followup, last visit was 04/12/13.  She reports that she has not been feeling so well in the past 6 months.  She unknowingly had not been getting refills on her Leviteracetam for 2 months and started having more seizures.  She then got a refill and has been back on Leviteracetam for a month, but still reports 3 seizures during that time, mostly during the night, when she wakes up after having been incontinent  of bladder and bowel and with sore muscles.  She has other times during the day when she feels that she is having them in which she feels confused and notices she has lost a few minutes, but these are unwitnessed.  She has been titrated up on her Lyrica to 100 mg TID, still has neuropathy pain, but states she is very sleepy most of the time.  UPDATE 04/12/13 (LL): Patient returns for followup visit since last visit on 10/10/2012. She states that she has not had any more seizures or syncopal episodes since her last visit. She continues to take Keppra 500 mg twice a day with no known side effects. She reports that in earlier in the year she was placed on Neurontin 600 mg 3 times a day by another doctor and felt very badly. She reports having visual hallucinations, nausea, excessive diaphoresis. She had several trips to the emergency room this year for heart rate and blood pressure issues, chest pain, nausea and vomiting, pyelonephritis, and abdominal pain. She has been evaluated by GI, and [facility]. She was taken off Neurontin which was replaced with Lyrica 50 mg 3 times a day by her pain management M.D. Dr. Kumar 2 months ago. Since this time she reports that she is doing much better. She has no complaints today.   PRIOR HPI 10/10/12 (VRP): 67 year old in female here for evaluation of seizure disorder. Patient had first seizure of life in the fifth grade. She was diagnosed with seizures and treated with phenobarbital.  She had generalized convulsions, tongue biting and incontinence. She had seizures until the ninth grade and then to stop. At some point she was taken off phenobarbital because she no longer had seizures. 2012 patient began to have seizures again. She was having intermittent episodes of staring, being in a daze, followed by shaking, tongue biting, vomiting and incontinence. Patient had an episode last night as well.    REVIEW OF SYSTEMS: Full 14 system review of systems performed and  negative except: memory loss snoring passing out blurred vision weight gain chills spinning.   ALLERGIES: Allergies  Allergen Reactions  . Prednisone Other (See Comments)    SENT PATIENT INTO A-FIB   . Amitriptyline Other (See Comments)    Made the patient disoriented  . Hydromorphone Hcl Other (See Comments)    Made the blood pressure drop (HYPOtension)  . Lisinopril Cough    HOME MEDICATIONS: Outpatient Medications Prior to Visit  Medication Sig Dispense Refill  . albuterol (PROAIR HFA) 108 (90 Base) MCG/ACT inhaler inhale 2 puffs every 6 hours if needed for wheezing or shortness of breath 8.5 g 1  . albuterol (PROVENTIL) (2.5 MG/3ML) 0.083% nebulizer solution Take 3 mLs (2.5 mg total) by nebulization every 4 (four) hours as needed for wheezing or shortness of breath (dx: J45.31). 75 mL 5  . amiodarone (PACERONE) 100 MG tablet Take 1 tablet (100 mg total) by mouth daily. (Patient taking differently: Take 50 mg by mouth daily. ) 90 tablet 3  . amLODipine (NORVASC) 10 MG tablet Take 1 tablet (10 mg total) by mouth daily. 60 tablet 3  . atorvastatin (LIPITOR) 80 MG tablet Take 80 mg by mouth at bedtime.  0  . calcium carbonate (OS-CAL) 600 MG TABS tablet Take 600 mg by mouth daily.    . cholecalciferol (VITAMIN D) 1000 UNITS tablet Take 1,000 Units by mouth 2 (two) times daily.     . Cranberry 500 MG CAPS Take 500 mg by mouth 2 (two) times daily.    . diclofenac sodium (VOLTAREN) 1 % GEL APPLY 2 INCHES TO AFFECTED AREA THREE TIMES A DAY 500 g 0  . diphenoxylate-atropine (LOMOTIL) 2.5-0.025 MG tablet Take 1 tablet by mouth 2 (two) times daily as needed for diarrhea or loose stools. 30 tablet 0  . DULoxetine (CYMBALTA) 60 MG capsule Take 60 mg by mouth at bedtime.     Marland Kitchen esomeprazole (NEXIUM) 40 MG capsule TAKE 1 CAPSULE(40 MG) BY MOUTH TWICE DAILY 30 MINUTES BEFORE BREAKFAST AND DINNER 60 capsule 3  . EVZIO 0.4 MG/0.4ML SOAJ Inject 0.4 mLs as directed daily as needed (for overdose).   0    . fluticasone (FLONASE) 50 MCG/ACT nasal spray instill 2 sprays into each nostril once daily 16 g 6  . guaiFENesin (ROBITUSSIN MUCUS+CHEST CONGEST) 100 MG/5ML liquid Take 10 mLs (200 mg total) by mouth 3 (three) times daily as needed for cough. 120 mL 0  . HUMALOG KWIKPEN 100 UNIT/ML KiwkPen Inject 18-32 Units into the skin 2 (two) times daily. PER Patient home SLIDING SCALE 18 units if CBG < 200 32 units  If CBG  > 200  0  . Insulin Glargine (LANTUS SOLOSTAR) 100 UNIT/ML Solostar Pen Inject 40 Units into the skin at bedtime. (Patient taking differently: Inject 60 Units into the skin at bedtime. ) 15 mL 11  . irbesartan (AVAPRO) 75 MG tablet Take 1 tablet by mouth daily.  11  . loratadine (CLARITIN) 10 MG tablet TAKE 1 TABLET BY MOUTH ONCE DAILY 60  tablet 0  . magnesium oxide (MAG-OX) 400 MG tablet TAKE 1 TABLET BY MOUTH EVERY DAY 90 tablet 3  . mometasone-formoterol (DULERA) 200-5 MCG/ACT AERO Inhale 2 puffs into the lungs 2 (two) times daily. 1 Inhaler 3  . nitroGLYCERIN (NITROSTAT) 0.4 MG SL tablet PLACE 1 TABLET UNDER THE TONGUE IF NEEDED EVERY 5 MINUTES AS NEEDED FOR CHEST PAIN; DO NOT EXCEED 3 DOSES 25 tablet 2  . ondansetron (ZOFRAN) 4 MG tablet Take 1 tablet (4 mg total) by mouth every 8 (eight) hours as needed for nausea or vomiting. 20 tablet 0  . oxyCODONE-acetaminophen (PERCOCET) 10-325 MG tablet Take 1 tablet by mouth 5 (five) times daily. (scheduled)     . Polyvinyl Alcohol-Povidone (REFRESH OP) Place 1 drop into both eyes 3 (three) times daily as needed (for dry eyes).     . potassium chloride (K-DUR) 10 MEQ tablet take 1 tablet by mouth once daily (Patient taking differently: Take 10 MEQ by mouth once daily) 90 tablet 3  . pregabalin (LYRICA) 100 MG capsule Take 100 mg by mouth 3 (three) times daily.    . rivaroxaban (XARELTO) 20 MG TABS tablet Take 20 mg by mouth once a day with supper (Patient taking differently: Take 20 mg by mouth daily with supper. ) 30 tablet 9  . tamsulosin  (FLOMAX) 0.4 MG CAPS capsule Take 0.4 mg by mouth at bedtime.  0  . vitamin C (ASCORBIC ACID) 500 MG tablet Take 500 mg by mouth 2 (two) times daily.     No facility-administered medications prior to visit.      PHYSICAL EXAM  GENERAL EXAM/CONSTITUTIONAL: Vitals:  Vitals:   06/08/18 1336 06/08/18 1343  BP: 134/69 122/68  Pulse: 80 80  Weight: 225 lb 3.2 oz (102.2 kg)   Height: 5' 6.5" (1.689 m)      Body mass index is 35.8 kg/m. Wt Readings from Last 3 Encounters:  06/08/18 225 lb 3.2 oz (102.2 kg)  06/01/18 213 lb (96.6 kg)  06/01/18 213 lb (96.6 kg)     Patient is in no distress; well developed, nourished and groomed; neck is supple  CARDIOVASCULAR:  Examination of carotid arteries is normal; no carotid bruits  Regular rate and rhythm, no murmurs  Examination of peripheral vascular system by observation and palpation is normal  EYES:  Ophthalmoscopic exam of optic discs and posterior segments is normal; no papilledema or hemorrhages  No exam data present  MUSCULOSKELETAL:  Gait, strength, tone, movements noted in Neurologic exam below  NEUROLOGIC: MENTAL STATUS:  No flowsheet data found.  awake, alert, oriented to person, place and time  recent and remote memory intact  normal attention and concentration  language fluent, comprehension intact, naming intact  fund of knowledge appropriate  CRANIAL NERVE:   2nd - no papilledema on fundoscopic exam  2nd, 3rd, 4th, 6th - pupils equal and reactive to light, visual fields full to confrontation, extraocular muscles intact, no nystagmus  5th - facial sensation symmetric  7th - facial strength symmetric  8th - hearing intact  9th - palate elevates symmetrically, uvula midline  11th - shoulder shrug symmetric  12th - tongue protrusion midline  MOTOR:   normal bulk and tone, full strength in the BUE, BLE  SENSORY:   normal and symmetric to light touch  COORDINATION:    finger-nose-finger, fine finger movements normal  REFLEXES:   deep tendon reflexes present and symmetric  GAIT/STATION:   narrow based gait; ANTALGIC GAIT    DIAGNOSTICS  Lab Results  Component Value Date   WBC 3.7 (L) 06/03/2018   HGB 12.1 06/03/2018   HCT 38.0 06/03/2018   MCV 90.3 06/03/2018   PLT 150 06/03/2018     Chemistry      Component Value Date/Time   NA 136 06/03/2018 0407   NA 142 01/26/2018 1216   K 4.2 06/03/2018 0407   CL 104 06/03/2018 0407   CO2 23 06/03/2018 0407   BUN 14 06/03/2018 0407   BUN 13 01/26/2018 1216   CREATININE 1.55 (H) 06/03/2018 0407   CREATININE 1.23 (H) 10/21/2016 1133      Component Value Date/Time   CALCIUM 9.3 06/03/2018 0407   ALKPHOS 101 01/26/2018 1216   AST 23 01/26/2018 1216   ALT 22 01/26/2018 1216   BILITOT 0.4 01/26/2018 1216      10/17/12 MRI BRAIN  - normal   05/13/16 MRI brain - Stable, normal noncontrast MRI head for age.  11/23/17 EEG - This is a normal EEG in the awake, drowsy, and sleep states without any epileptiform abnormalities noted.   There were no noted spells during this tracing.  A single normal EEG does not exclude the diagnosis of epilepsy.  Clinical correlation advised.  11/25/17 MRI brain [I reviewed images myself and agree with interpretation. -VRP]  - Normal brain MRI for age. No acute intracranial abnormality identified.   ASSESSMENT AND PLAN  67 y.o. right-handed female with multiple medical problems seen in our office for recurrent passing out spells, likely recurrent syncope / presyncope. Seizure disorder ruled out by VEEG at [facility] (2018) ---> non-epileptic spells (tremors, confusion, slurred speech, dazed).    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    Suanne Marker, MD 06/08/2018, 1:58 PM Certified in Neurology, Neurophysiology and Neuroimaging  [facility] [address] [place], [place] [address] [phone]

## 2018-06-09 NOTE — Telephone Encounter (Signed)
I have tried to call PT multiple times now with no answer.

## 2018-06-09 NOTE — Telephone Encounter (Signed)
Verbal orders given to [facility].  Ples Specter, RN [facility] RN)

## 2018-06-16 ENCOUNTER — Other Ambulatory Visit: Payer: Self-pay

## 2018-06-16 ENCOUNTER — Ambulatory Visit (INDEPENDENT_AMBULATORY_CARE_PROVIDER_SITE_OTHER): Payer: Medicare Other | Admitting: Family Medicine

## 2018-06-16 DIAGNOSIS — T148XXA Other injury of unspecified body region, initial encounter: Secondary | ICD-10-CM | POA: Diagnosis not present

## 2018-06-16 NOTE — Assessment & Plan Note (Signed)
Likely secondary to Xarelto use.  Although patient does not remember bumping anything possibly some small amount of trauma could have caused this bleeding as Xarelto is a blood thinner.  Areas about 5 cm in length.  Slightly tender to palpation.  Small 1 cm nodule at Center of bruise.  Likely hematoma.  Advised use of heating pads to help with pain.  Patient refusing Tylenol stating that she has Percocet at home.  Advised on conservative measures first.  Strict return precautions given.  Advised patient to follow-up in 1 to 2 weeks if no improvement.  Advised to go to the emergency room if significantly worsening.

## 2018-06-16 NOTE — Patient Instructions (Signed)
It was a pleasure seeing you today.   Today we discussed your bruising and knot  For your bruising and knot: please keep a close eye on it. If it gets larger or the knot increases in size please come back in. It is likely that the knot is due to some bleeding under the skin. Please use heating pads as needed for the pain.   Please follow up in 1-2 weeks if no improvement or sooner if symptoms persist or worsen. Please call the clinic immediately if you have any concerns.   Our clinic's number is [phone]. Please call with questions or concerns.   Please go to the emergency room if it is getting much larger or severe worsening pain.    Thank you,  Oralia Manis, DO

## 2018-06-16 NOTE — Progress Notes (Signed)
   Subjective:    Patient ID: [name], female    DOB: [birth year], 67 y.o.   MRN: [id number]   CC: bruise  HPI: Bruise Patient presenting today for bruise.  States that she is not sure when the bruise started.  Patient states that she was recently discharged from [facility] on 10/18.  States that that following Monday she had physical therapy and noticed that when she took a shower that evening she had a bruise and a lump.  States that she did not bump anything.  Is unsure where this came from.  States that she has been taking Xarelto 20mg  daily and was told by her doctor that anytime she gets a bruise she should come in to be seen.  States that it is not painful but there is a discomfort.  A "ting to it".  Dates that she knows there.  States that the bruise is slightly getting bigger and darker.  Objective:  BP 140/72   Pulse 80   Temp 98.1 F (36.7 C) (Oral)   Wt 217 lb 12.8 oz (98.8 kg)   SpO2 99%   BMI 34.63 kg/m  Vitals and nursing note reviewed  General: well nourished, in no acute distress HEENT: normocephalic, moist mucous membranes, good dentition without erythema or discharge noted in posterior oropharynx Extremities: no edema or cyanosis. Warm, well perfused.  Skin: warm and dry, no rashes noted  Neuro: alert and oriented, no focal deficits   Assessment & Plan:    Bruise Likely secondary to Xarelto use.  Although patient does not remember bumping anything possibly some small amount of trauma could have caused this bleeding as Xarelto is a blood thinner.  Areas about 5 cm in length.  Slightly tender to palpation.  Small 1 cm nodule at Center of bruise.  Likely hematoma.  Advised use of heating pads to help with pain.  Patient refusing Tylenol stating that she has Percocet at home.  Advised on conservative measures first.  Strict return precautions given.  Advised patient to follow-up in 1 to 2 weeks if no improvement.  Advised to go to the emergency room if  significantly worsening.    Return in about 1 week (around 06/23/2018), or if symptoms worsen or fail to improve.   Oralia Manis, DO, PGY-2

## 2018-06-25 ENCOUNTER — Encounter (HOSPITAL_COMMUNITY): Payer: Self-pay | Admitting: Emergency Medicine

## 2018-06-25 ENCOUNTER — Ambulatory Visit (HOSPITAL_COMMUNITY)
Admission: EM | Admit: 2018-06-25 | Discharge: 2018-06-25 | Disposition: A | Discharge status: Home / Self Care | Payer: Medicare Other | Attending: Family Medicine | Admitting: Family Medicine

## 2018-06-25 DIAGNOSIS — J029 Acute pharyngitis, unspecified: Secondary | ICD-10-CM | POA: Diagnosis not present

## 2018-06-25 DIAGNOSIS — J020 Streptococcal pharyngitis: Secondary | ICD-10-CM

## 2018-06-25 LAB — POCT RAPID STREP A: Streptococcus, Group A Screen (Direct): POSITIVE — AB

## 2018-06-25 MED ORDER — PENICILLIN G BENZATHINE 1200000 UNIT/2ML IM SUSP
1.2000 10*6.[IU] | Freq: Once | INTRAMUSCULAR | Status: AC
  Administered 2018-06-25: 1.2 10*6.[IU] via INTRAMUSCULAR

## 2018-06-25 MED ORDER — PENICILLIN G BENZATHINE 1200000 UNIT/2ML IM SUSP
INTRAMUSCULAR | Status: AC
  Filled 2018-06-25: qty 2

## 2018-06-25 NOTE — ED Triage Notes (Signed)
Pt c/o bilateral ear ache, swollen neck lymph nodes sore throat, congestion x3 days. Pt states she moved out of her house with kicked up a lot of mold.

## 2018-06-25 NOTE — ED Provider Notes (Signed)
[facility]   [id number] 06/25/18 Arrival Time: 1216  ASSESSMENT & PLAN:  1. Sore throat   2. Strep pharyngitis    No sign of peritonsillar abscess. Discussed. Outlined signs and symptoms indicating need for more acute intervention.  Prefers Bicillin instead of PO antibiotic. Meds ordered this encounter  Medications  . penicillin g benzathine (BICILLIN LA) 1200000 UNIT/2ML injection 1.2 Million Units    Order Specific Question:   Antibiotic Indication:    Answer:   Pharyngitis    Labs Reviewed  POCT RAPID STREP A - Abnormal; Notable for the following components:      Result Value   Streptococcus, Group A Screen (Direct) POSITIVE (*)    All other components within normal limits   OTC analgesics and throat care as needed. Will follow up if not showing significant improvement over the next 24-48 hours.    Discharge Instructions      You may use over the counter ibuprofen or acetaminophen as needed.   For a sore throat, over the counter products such as [employer] Peroxyl Mouth Sore Rinse or Chloraseptic Sore Throat Spray may provide some temporary relief.      Reviewed expectations re: course of current medical issues. Questions answered.  Patient verbalized understanding. After Visit Summary given.   SUBJECTIVE:  [name] is a 67 y.o. female who reports a sore throat. Describes as pain with swallowing. Onset abrupt beginning 3 days ago. "Hard to talk." Decreased PO intake secondary to discomfort with swallowing. Fever reported: yes, subjective with chills. No associated n/v/abdominal symptoms. Does have a very mild cough. No nasal congestion. Sick contacts: none known. Very fatigued. No neck pain or neck swelling reported. No h/o recurrent sore throats. OTC treatment: Tylenol with mild and temporary help.  ROS: As per HPI.  OBJECTIVE:  Vitals:   06/25/18 1339  BP: 126/74  Pulse: 74  Resp: 18  Temp: 98.2 F (36.8 C)  SpO2: 98%    General  appearance: alert; ; fatigued-appearing; appears to be in pain HEENT: throat with tonsillar hypertrophy, marked erythema and exudates present; uvula midline yes; tongue normal Neck: supple with FROM; small cervical LAD bilaterally with tenderness to palpation CV: RRR Lungs: clear to auscultation bilaterally Abd: soft; non-tender Skin: reveals no rash; warm and dry Psychological: alert and cooperative; normal mood and affect  Allergies  Allergen Reactions  . Prednisone Other (See Comments)    SENT PATIENT INTO A-FIB   . Amitriptyline Other (See Comments)    Made the patient disoriented  . Hydromorphone Hcl Other (See Comments)    Made the blood pressure drop (HYPOtension)  . Lisinopril Cough    Past Medical History:  Diagnosis Date  . Anxiety   . Arthritis    "knees, hands, ankles, feet" (05/12/2016)  . Asthma    Dr. [name]  . Atrial fibrillation (HCC)   . Bipolar disorder (HCC)   . Chronic back pain    "lower and middle" (05/12/2016)  . Chronic kidney disease (CKD), stage III (moderate) (HCC) 05/14/2015  . CKD (chronic kidney disease), stage III (HCC)   . Colon polyps   . Depression   . Diverticulosis   . Epilepsy (HCC)   . Essential hypertension   . Fibromyalgia   . Gallstones   . GERD (gastroesophageal reflux disease)   . H/O hiatal hernia   . Hyperlipidemia   . IBS (irritable bowel syndrome)   . Insulin dependent diabetes mellitus (HCC)   . Paroxysmal A-fib (HCC)  failed medical therapy with tikosyn, s/p AF ablation x 2  . Paroxysmal A-fib (HCC)    a. On [name]. b. Recurrence 08/2014 in setting of GI illness.  . Paroxysmal atrial fibrillation (HCC) 05/18/2017  . Peripheral neuropathy   . Persistent atrial fibrillation   . Sarcoidosis of lung (HCC)    Dr. [name]  . Seizures (HCC)    "epileptic; pretty regular recently" (05/12/2016)  . Syncope   . Type II diabetes mellitus (HCC)   . Type II diabetes mellitus (HCC)    Social History   Socioeconomic History  .  Marital status: Divorced    Spouse name: Not on file  . Number of children: 3  . Years of education: 12th  . Highest education level: Not on file  Occupational History  . Occupation: disabled    Comment: CNA  Social Needs  . Financial resource strain: Hard  . Food insecurity:    Worry: Often true    Inability: Sometimes true  . Transportation needs:    Medical: No    Non-medical: No  Tobacco Use  . Smoking status: Never Smoker  . Smokeless tobacco: Never Used  Substance and Sexual Activity  . Alcohol use: No  . Drug use: No  . Sexual activity: Not Currently  Lifestyle  . Physical activity:    Days per week: 1 day    Minutes per session: 20 min  . Stress: To some extent  Relationships  . Social connections:    Talks on phone: More than three times a week    Gets together: Once a week    Attends religious service: More than 4 times per year    Active member of club or organization: Yes    Attends meetings of clubs or organizations: More than 4 times per year    Relationship status: Divorced  . Intimate partner violence:    Fear of current or ex partner: Patient refused    Emotionally abused: Patient refused    Physically abused: Patient refused    Forced sexual activity: Patient refused  Other Topics Concern  . Not on file  Social History Narrative   Pt. Lives at home with her daughter. She is single, has three children, does not work currently, and has a 12th grade education level. She has never used tobacco or alcohol. Quit using illicit drugs at the age of 21 and rarely has caffeine.   Family History  Problem Relation Age of Onset  . Heart attack Mother        @ age 69  . Mental illness Mother   . Diabetes Mother   . Hypertension Mother        siblings  . Alzheimer's disease Mother   . Depression Mother   . Hyperlipidemia Mother   . Heart attack Brother 50  . Alcohol abuse Brother   . Depression Brother   . Diabetes Brother   . Hyperlipidemia Brother   .  Hypertension Brother   . Kidney disease Brother   . Drug abuse Brother   . Alcohol abuse Father   . Heart attack Father   . Hyperlipidemia Father   . Hypertension Father   . Colon cancer Maternal Aunt   . Prostate cancer Maternal Grandfather   . Diabetes Maternal Grandfather   . Hyperlipidemia Maternal Grandfather   . Ovarian cancer Maternal Aunt   . Diabetes Unknown   . Hypertension Unknown   . Lupus Sister   . Alcohol abuse Sister   .  Depression Sister   . Diabetes Sister   . Hyperlipidemia Sister   . Hypertension Sister   . Kidney disease Sister   . Drug abuse Sister   . Ovarian cancer Cousin   . Diabetes Maternal Grandmother   . Hyperlipidemia Maternal Grandmother   . Breast cancer Neg Hx           Mardella Layman, MD 06/25/18 [phone]

## 2018-06-25 NOTE — Discharge Instructions (Signed)
You may use over the counter ibuprofen or acetaminophen as needed.  °For a sore throat, over the counter products such as [employer] Peroxyl Mouth Sore Rinse or Chloraseptic Sore Throat Spray may provide some temporary relief. ° ° ° ° °

## 2018-06-27 ENCOUNTER — Ambulatory Visit: Payer: Medicare Other

## 2018-06-30 ENCOUNTER — Other Ambulatory Visit: Payer: Self-pay | Admitting: Nurse Practitioner

## 2018-07-04 ENCOUNTER — Ambulatory Visit: Payer: Medicare Other | Admitting: Cardiology

## 2018-07-06 ENCOUNTER — Encounter: Payer: Self-pay | Admitting: Internal Medicine

## 2018-07-06 ENCOUNTER — Ambulatory Visit (INDEPENDENT_AMBULATORY_CARE_PROVIDER_SITE_OTHER): Payer: Medicare Other | Admitting: Internal Medicine

## 2018-07-06 VITALS — BP 136/74 | HR 80 | Ht 66.0 in | Wt 209.4 lb

## 2018-07-06 DIAGNOSIS — I10 Essential (primary) hypertension: Secondary | ICD-10-CM | POA: Diagnosis not present

## 2018-07-06 DIAGNOSIS — I480 Paroxysmal atrial fibrillation: Secondary | ICD-10-CM | POA: Diagnosis not present

## 2018-07-06 DIAGNOSIS — I495 Sick sinus syndrome: Secondary | ICD-10-CM

## 2018-07-06 NOTE — H&P (View-Only) (Signed)
 PCP: Anderson, Chelsey L, DO Primary Cardiologist: Dr Nishan Primary EP: Dr Sayre Witherington  [name] is a 67 y.o. female who presents today for routine electrophysiology followup.  Since last being seen in our clinic, the patient reports doing reasonably well.  She has had several hospitalizations for chest pain and also has had unexplained syncope.  Today, she denies symptoms of palpitations, exertional chest pain, shortness of breath,  lower extremity edema. .  The patient is otherwise without complaint today.   Past Medical History:  Diagnosis Date  . Anxiety   . Arthritis    "knees, hands, ankles, feet" (05/12/2016)  . Asthma    Dr. Wert  . Atrial fibrillation (HCC)   . Bipolar disorder (HCC)   . Chronic back pain    "lower and middle" (05/12/2016)  . Chronic kidney disease (CKD), stage III (moderate) (HCC) 05/14/2015  . CKD (chronic kidney disease), stage III (HCC)   . Colon polyps   . Depression   . Diverticulosis   . Epilepsy (HCC)   . Essential hypertension   . Fibromyalgia   . Gallstones   . GERD (gastroesophageal reflux disease)   . H/O hiatal hernia   . Hyperlipidemia   . IBS (irritable bowel syndrome)   . Insulin dependent diabetes mellitus (HCC)   . Paroxysmal A-fib (HCC)    failed medical therapy with tikosyn, s/p AF ablation x 2  . Paroxysmal A-fib (HCC)    a. On [name]. b. Recurrence 08/2014 in setting of GI illness.  . Paroxysmal atrial fibrillation (HCC) 05/18/2017  . Peripheral neuropathy   . Persistent atrial fibrillation   . Sarcoidosis of lung (HCC)    Dr. Wert  . Seizures (HCC)    "epileptic; pretty regular recently" (05/12/2016)  . Syncope   . Type II diabetes mellitus (HCC)   . Type II diabetes mellitus (HCC)    Past Surgical History:  Procedure Laterality Date  . ABDOMINAL HYSTERECTOMY  1995  . ANTERIOR CERVICAL DECOMP/DISCECTOMY FUSION  07/11/2012   Procedure: ANTERIOR CERVICAL DECOMPRESSION/DISCECTOMY FUSION 1 LEVEL;  Surgeon: Jeffrey D  Jenkins, MD;  Location: [facility];  Service: Neurosurgery;  Laterality: N/A;  Cervical Five-Six Anterior Cervical Decompression with Fusion Interbody Prothesis Plating and Bonegraft  . ATRIAL FIBRILLATION ABLATION N/A 05/18/2017   Procedure: Atrial Fibrillation Ablation;  Surgeon: Zayvon Alicea, MD;  Location: [facility] INVASIVE CV LAB;  Service: Cardiovascular;  Laterality: N/A;  . ATRIAL FIBRILLATION ABLATION N/A 11/16/2017   Procedure: ATRIAL FIBRILLATION ABLATION;  Surgeon: Caedon Bond, MD;  Location: [facility] IN[facility]AS[facility] CV LAB;  Service: Cardiovascular;  Laterality: N/A;  . CARDIAC CATHETERIZATION  2012   a. Normal coronaries 2012. b. Normal nuc 09/2014.  . CARDIAC CATHETERIZATION  05/18/2017  . CHOLECYSTECTOMY OPEN  1976  . CLOSED REDUCTION ANKLE FRACTURE Left 10/2006   "got steel rod in my leg; and screws"  . COLON SURGERY    . DILATION AND CURETTAGE OF UTERUS    . ESOPHAGEAL MANOMETRY  03/21/2012   Procedure: ESOPHAGEAL MANOMETRY (EM);  Surgeon: Daniel P Jacobs, MD;  Location: [facility];  Service: Endoscopy;  Laterality: N/A;  . FRACTURE SURGERY    . NEUROPLASTY / TRANSPOSITION MEDIAN NERVE AT CARPAL TUNNEL Left 2004  . NEUROPLASTY / TRANSPOSITION MEDIAN NERVE AT CARPAL TUNNEL Right 2002  . 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 SALPINGOOPHORECTOMY Bilateral 2000  . TEE WITHOUT CARDIOVERSION N/A 05/18/2017   Procedure: TRANSESOPHAGEAL ECHOCARDIOGRAM (TEE);  Surgeon: Crenshaw, Brian S, MD;  Location: [facility] END[facility];    Service: Cardiovascular;  Laterality: N/A;  . TUBAL LIGATION  1982    ROS- all systems are reviewed and negatives except as per HPI above  Current Outpatient Medications  Medication Sig Dispense Refill  . albuterol (PROAIR HFA) 108 (90 Base) MCG/ACT inhaler inhale 2 puffs every 6 hours if needed for wheezing or shortness of breath 8.5 g 1  . albuterol (PROVENTIL) (2.5 MG/3ML) 0.083% nebulizer solution Take 3 mLs (2.5 mg total) by nebulization every 4 (four) hours as needed for  wheezing or shortness of breath (dx: J45.31). 75 mL 5  . amiodarone (PACERONE) 100 MG tablet Take 1 tablet (100 mg total) by mouth daily. (Patient taking differently: Take 50 mg by mouth daily. ) 90 tablet 3  . amLODipine (NORVASC) 10 MG tablet Take 1 tablet (10 mg total) by mouth daily. 60 tablet 3  . atorvastatin (LIPITOR) 80 MG tablet Take 80 mg by mouth at bedtime.  0  . calcium carbonate (OS-CAL) 600 MG TABS tablet Take 600 mg by mouth daily.    . cholecalciferol (VITAMIN D) 1000 UNITS tablet Take 1,000 Units by mouth 2 (two) times daily.     . Cranberry 500 MG CAPS Take 500 mg by mouth 2 (two) times daily.    . diclofenac sodium (VOLTAREN) 1 % GEL APPLY 2 INCHES TO AFFECTED AREA THREE TIMES A DAY 500 g 0  . diphenoxylate-atropine (LOMOTIL) 2.5-0.025 MG tablet TAKE 1 TABLET BY MOUTH TWICE DAILY AS NEEDED FOR DIARRHEA OR LOOSE STOOLS 30 tablet 0  . DULoxetine (CYMBALTA) 60 MG capsule Take 60 mg by mouth at bedtime.     . esomeprazole (NEXIUM) 40 MG capsule TAKE 1 CAPSULE(40 MG) BY MOUTH TWICE DAILY 30 MINUTES BEFORE BREAKFAST AND DINNER 60 capsule 3  . EVZIO 0.4 MG/0.4ML SOAJ Inject 0.4 mLs as directed daily as needed (for overdose).   0  . fluticasone (FLONASE) 50 MCG/ACT nasal spray instill 2 sprays into each nostril once daily 16 g 6  . guaiFENesin (ROBITUSSIN MUCUS+CHEST CONGEST) 100 MG/5ML liquid Take 10 mLs (200 mg total) by mouth 3 (three) times daily as needed for cough. 120 mL 0  . HUMALOG KWIKPEN 100 UNIT/ML KiwkPen Inject 18-32 Units into the skin 2 (two) times daily. PER Patient home SLIDING SCALE 18 units if CBG < 200 32 units  If CBG  > 200  0  . Insulin Glargine (LANTUS SOLOSTAR) 100 UNIT/ML Solostar Pen Inject 40 Units into the skin at bedtime. (Patient taking differently: Inject 60 Units into the skin at bedtime. ) 15 mL 11  . irbesartan (AVAPRO) 75 MG tablet Take 1 tablet by mouth daily.  11  . loratadine (CLARITIN) 10 MG tablet TAKE 1 TABLET BY MOUTH ONCE DAILY 60 tablet 0    . magnesium oxide (MAG-OX) 400 MG tablet TAKE 1 TABLET BY MOUTH EVERY DAY 90 tablet 3  . mometasone-formoterol (DULERA) 200-5 MCG/ACT AERO Inhale 2 puffs into the lungs 2 (two) times daily. 1 Inhaler 3  . nitroGLYCERIN (NITROSTAT) 0.4 MG SL tablet PLACE 1 TABLET UNDER THE TONGUE IF NEEDED EVERY 5 MINUTES AS NEEDED FOR CHEST PAIN; DO NOT EXCEED 3 DOSES 25 tablet 2  . ondansetron (ZOFRAN) 4 MG tablet Take 1 tablet (4 mg total) by mouth every 8 (eight) hours as needed for nausea or vomiting. 20 tablet 0  . oxyCODONE-acetaminophen (PERCOCET) 10-325 MG tablet Take 1 tablet by mouth 5 (five) times daily. (scheduled)     . Polyvinyl Alcohol-Povidone (REFRESH OP) Place 1 drop into   both eyes 3 (three) times daily as needed (for dry eyes).     . potassium chloride (K-DUR) 10 MEQ tablet take 1 tablet by mouth once daily (Patient taking differently: Take 10 MEQ by mouth once daily) 90 tablet 3  . pregabalin (LYRICA) 100 MG capsule Take 100 mg by mouth 3 (three) times daily.    . rivaroxaban (XARELTO) 20 MG TABS tablet Take 20 mg by mouth once a day with supper (Patient taking differently: Take 20 mg by mouth daily with supper. ) 30 tablet 9  . tamsulosin (FLOMAX) 0.4 MG CAPS capsule Take 0.4 mg by mouth at bedtime.  0  . vitamin C (ASCORBIC ACID) 500 MG tablet Take 500 mg by mouth 2 (two) times daily.     No current facility-administered medications for this visit.     Physical Exam: Vitals:   07/06/18 1011  BP: 136/74  Pulse: 80  SpO2: 98%  Weight: 209 lb 6.4 oz (95 kg)  Height: 5' 6" (1.676 m)    GEN- The patient is well appearing, alert and oriented x 3 today.   Head- normocephalic, atraumatic Eyes-  Sclera clear, conjunctiva pink Ears- hearing intact Oropharynx- clear Lungs- Clear to ausculation bilaterally, normal work of breathing Heart- Regular rate and rhythm, no murmurs, rubs or gallops, PMI not laterally displaced GI- soft, NT, ND, + BS Extremities- no clubbing, cyanosis, or  edema  Wt Readings from Last 3 Encounters:  07/06/18 209 lb 6.4 oz (95 kg)  06/16/18 217 lb 12.8 oz (98.8 kg)  06/08/18 225 lb 3.2 oz (102.2 kg)    EKG tracing ordered today is personally reviewed and shows sinus rhythm 80 bpm, PR 176 msec, QRS 92 msec, QTc 456 msec  Assessment and Plan:  1. Paroxysmal atrial fibrillation/ atrial flutter Doing well s/p ablation She has palpitations and chest discomfort of unclear etiology. I would advise long term monitoring with an implanted loop recorder.  We discussed today the risks and benefits of ILR. She wishes to proceed.  We will therefore arrange ILR implantation for afib management post ablation and to further characterize recurrent presyncope/ palpitations.  2. Post termination pauses Resolved post ablation Will further evaluate with ILR as above  3. ? Pseudoseizures.  Dr Penumalli's notes reviewed ILR may be beneficial to exclude arrhythmia as the cause.  4. HTN Stable No change required today  Return in 3 months  Makayela Secrest MD, FACC 07/06/2018 10:26 AM   

## 2018-07-06 NOTE — Patient Instructions (Addendum)
Medication Instructions:  Your physician has recommended you make the following change in your medication:   1.  STOP taking AMIODARONE  Labwork: None ordered.  Testing/Procedures: None ordered.  Follow-Up:  You will follow up with the device clinic 7-10 days after your procedure for a wound check.  Your physician wants you to follow-up in: 3 months with Dr. Johney FrameAllred.      LOOP IMPLANT instructions:  Please [facility] at:  6:30 am on July 12, 2018 You may eat before this procedure You may take your normal AM medications the morning of the procedure You will be discharged after this procedure     Implantable Loop Recorder Placement An implantable loop recorder is a small electronic device that is placed under the skin of your chest. It is about the size of an AA ("double A") battery. The device records the electrical activity of your heart over a long period of time. Your health care provider can download these recordings to monitor your heart. You may need an implantable loop recorder if you have periods of abnormal heart activity (arrhythmias) or unexplained fainting (syncope) caused by a heart problem. Tell a health care provider about:  Any allergies you have.  All medicines you are taking, including vitamins, herbs, eye drops, creams, and over-the-counter medicines.  Any problems you or family members have had with anesthetic medicines.  Any blood disorders you have.  Any surgeries you have had.  Any medical conditions you have.  Whether you are pregnant or may be pregnant. What are the risks? Generally, this is a safe procedure. However, as with any procedure, problems may occur, including:  Infection.  Bleeding.  Allergic reactions to anesthetic medicines.  Damage to nerves or blood vessels.  Failure of the device to work. This could require another surgery to replace  it.  What happens before the procedure?   You may have a physical exam, blood tests, and imaging tests of your heart, such as a chest X-ray.  Follow instructions from your health care provider about eating or drinking restrictions.  Ask your health care provider about: ? Changing or stopping your regular medicines. This is especially important if you are taking diabetes medicines or blood thinners. ? Taking medicines such as aspirin and ibuprofen. These medicines can thin your blood. Do not take these medicines before your procedure if your surgeon instructs you not to.  Ask your health care provider how your surgical site will be marked or identified.  You may be given antibiotic medicine to help prevent infection.  Plan to have someone take you home after the procedure.  If you will be going home right after the procedure, plan to have someone with you for 24 hours.  Do not use any tobacco products, such as cigarettes, chewing tobacco, and e-cigarettes as told by your surgeon. If you need help quitting, ask your health care provider. What happens during the procedure?  To reduce your risk of infection: ? Your health care team will wash or sanitize their hands. ? Your skin will be washed with soap.  An IV tube will be inserted into one of your veins.  You may be given an antibiotic medicine through the IV tube.  You may be given one or more of the following: ? A medicine to help you relax (sedative). ? A medicine to numb the area (local anesthetic).  A small cut (incision) will be  made on the left side of your upper chest.  A pocket will be created under your skin.  The device will be placed in the pocket.  The incision will be closed with stitches (sutures) or adhesive strips.  A bandage (dressing) will be placed over the incision. The procedure may vary among health care providers and hospitals. What happens after the procedure?  Your blood pressure, heart rate,  breathing rate, and blood oxygen level will be monitored often until the medicines you were given have worn off.  You may be able to go home on the day of your surgery. Before going home: ? Your health care provider will program your recorder. ? You will learn how to trigger your device with a handheld activator. ? You will learn how to send recordings to your health care provider. ? You will get an ID card for your device, and you will be told when to use it.  Do not drive for 24 hours if you received a sedative. This information is not intended to replace advice given to you by your health care provider. Make sure you discuss any questions you have with your health care provider. Document Released: 07/15/2015 Document Revised: 01/09/2016 Document Reviewed: 05/08/2015 Elsevier Interactive Patient Education  [employer].

## 2018-07-06 NOTE — Progress Notes (Signed)
PCP: Leeroy Bock, DO Primary Cardiologist: Dr [name] Primary EP: Dr Johney Frame  [name] is a 67 y.o. female who presents today for routine electrophysiology followup.  Since last being seen in our clinic, the patient reports doing reasonably well.  She has had several hospitalizations for chest pain and also has had unexplained syncope.  Today, she denies symptoms of palpitations, exertional chest pain, shortness of breath,  lower extremity edema. .  The patient is otherwise without complaint today.   Past Medical History:  Diagnosis Date  . Anxiety   . Arthritis    "knees, hands, ankles, feet" (05/12/2016)  . Asthma    Dr. [name]  . Atrial fibrillation (HCC)   . Bipolar disorder (HCC)   . Chronic back pain    "lower and middle" (05/12/2016)  . Chronic kidney disease (CKD), stage III (moderate) (HCC) 05/14/2015  . CKD (chronic kidney disease), stage III (HCC)   . Colon polyps   . Depression   . Diverticulosis   . Epilepsy (HCC)   . Essential hypertension   . Fibromyalgia   . Gallstones   . GERD (gastroesophageal reflux disease)   . H/O hiatal hernia   . Hyperlipidemia   . IBS (irritable bowel syndrome)   . Insulin dependent diabetes mellitus (HCC)   . Paroxysmal A-fib (HCC)    failed medical therapy with tikosyn, s/p AF ablation x 2  . Paroxysmal A-fib (HCC)    a. On [name]. b. Recurrence 08/2014 in setting of GI illness.  . Paroxysmal atrial fibrillation (HCC) 05/18/2017  . Peripheral neuropathy   . Persistent atrial fibrillation   . Sarcoidosis of lung (HCC)    Dr. [name]  . Seizures (HCC)    "epileptic; pretty regular recently" (05/12/2016)  . Syncope   . Type II diabetes mellitus (HCC)   . Type II diabetes mellitus (HCC)    Past Surgical History:  Procedure Laterality Date  . ABDOMINAL HYSTERECTOMY  1995  . ANTERIOR CERVICAL DECOMP/DISCECTOMY FUSION  07/11/2012   Procedure: ANTERIOR CERVICAL DECOMPRESSION/DISCECTOMY FUSION 1 LEVEL;  Surgeon: [name], MD;  Location: [facility];  Service: Neurosurgery;  Laterality: N/A;  Cervical Five-Six Anterior Cervical Decompression with Fusion Interbody Prothesis Plating and Bonegraft  . ATRIAL FIBRILLATION ABLATION N/A 05/18/2017   Procedure: Atrial Fibrillation Ablation;  Surgeon: [name], MD;  Location: [facility];  Service: Cardiovascular;  Laterality: N/A;  . ATRIAL FIBRILLATION ABLATION N/A 11/16/2017   Procedure: ATRIAL FIBRILLATION ABLATION;  Surgeon: [name], MD;  Location: [facility];  Service: Cardiovascular;  Laterality: N/A;  . CARDIAC CATHETERIZATION  2012   a. Normal coronaries 2012. b. Normal nuc 09/2014.  Marland Kitchen CARDIAC CATHETERIZATION  05/18/2017  . CHOLECYSTECTOMY OPEN  1976  . CLOSED REDUCTION ANKLE FRACTURE Left 10/2006   "got steel rod in my leg; and screws"  . COLON SURGERY    . DILATION AND CURETTAGE OF UTERUS    . ESOPHAGEAL MANOMETRY  03/21/2012   Procedure: ESOPHAGEAL MANOMETRY (EM);  Surgeon: [name], MD;  Location: [facility];  Service: Endoscopy;  Laterality: N/A;  . FRACTURE SURGERY    . NEUROPLASTY / TRANSPOSITION MEDIAN NERVE AT CARPAL TUNNEL Left 2004  . NEUROPLASTY / TRANSPOSITION MEDIAN NERVE AT CARPAL TUNNEL Right 2002  . 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 SALPINGOOPHORECTOMY Bilateral 2000  . TEE WITHOUT CARDIOVERSION N/A 05/18/2017   Procedure: TRANSESOPHAGEAL ECHOCARDIOGRAM (TEE);  Surgeon: [name], MD;  Location: [facility];  Service: Cardiovascular;  Laterality: N/A;  . TUBAL LIGATION  1982    ROS- all systems are reviewed and negatives except as per HPI above  Current Outpatient Medications  Medication Sig Dispense Refill  . albuterol (PROAIR HFA) 108 (90 Base) MCG/ACT inhaler inhale 2 puffs every 6 hours if needed for wheezing or shortness of breath 8.5 g 1  . albuterol (PROVENTIL) (2.5 MG/3ML) 0.083% nebulizer solution Take 3 mLs (2.5 mg total) by nebulization every 4 (four) hours as needed for  wheezing or shortness of breath (dx: J45.31). 75 mL 5  . amiodarone (PACERONE) 100 MG tablet Take 1 tablet (100 mg total) by mouth daily. (Patient taking differently: Take 50 mg by mouth daily. ) 90 tablet 3  . amLODipine (NORVASC) 10 MG tablet Take 1 tablet (10 mg total) by mouth daily. 60 tablet 3  . atorvastatin (LIPITOR) 80 MG tablet Take 80 mg by mouth at bedtime.  0  . calcium carbonate (OS-CAL) 600 MG TABS tablet Take 600 mg by mouth daily.    . cholecalciferol (VITAMIN D) 1000 UNITS tablet Take 1,000 Units by mouth 2 (two) times daily.     . Cranberry 500 MG CAPS Take 500 mg by mouth 2 (two) times daily.    . diclofenac sodium (VOLTAREN) 1 % GEL APPLY 2 INCHES TO AFFECTED AREA THREE TIMES A DAY 500 g 0  . diphenoxylate-atropine (LOMOTIL) 2.5-0.025 MG tablet TAKE 1 TABLET BY MOUTH TWICE DAILY AS NEEDED FOR DIARRHEA OR LOOSE STOOLS 30 tablet 0  . DULoxetine (CYMBALTA) 60 MG capsule Take 60 mg by mouth at bedtime.     Marland Kitchen esomeprazole (NEXIUM) 40 MG capsule TAKE 1 CAPSULE(40 MG) BY MOUTH TWICE DAILY 30 MINUTES BEFORE BREAKFAST AND DINNER 60 capsule 3  . EVZIO 0.4 MG/0.4ML SOAJ Inject 0.4 mLs as directed daily as needed (for overdose).   0  . fluticasone (FLONASE) 50 MCG/ACT nasal spray instill 2 sprays into each nostril once daily 16 g 6  . guaiFENesin (ROBITUSSIN MUCUS+CHEST CONGEST) 100 MG/5ML liquid Take 10 mLs (200 mg total) by mouth 3 (three) times daily as needed for cough. 120 mL 0  . HUMALOG KWIKPEN 100 UNIT/ML KiwkPen Inject 18-32 Units into the skin 2 (two) times daily. PER Patient home SLIDING SCALE 18 units if CBG < 200 32 units  If CBG  > 200  0  . Insulin Glargine (LANTUS SOLOSTAR) 100 UNIT/ML Solostar Pen Inject 40 Units into the skin at bedtime. (Patient taking differently: Inject 60 Units into the skin at bedtime. ) 15 mL 11  . irbesartan (AVAPRO) 75 MG tablet Take 1 tablet by mouth daily.  11  . loratadine (CLARITIN) 10 MG tablet TAKE 1 TABLET BY MOUTH ONCE DAILY 60 tablet 0    . magnesium oxide (MAG-OX) 400 MG tablet TAKE 1 TABLET BY MOUTH EVERY DAY 90 tablet 3  . mometasone-formoterol (DULERA) 200-5 MCG/ACT AERO Inhale 2 puffs into the lungs 2 (two) times daily. 1 Inhaler 3  . nitroGLYCERIN (NITROSTAT) 0.4 MG SL tablet PLACE 1 TABLET UNDER THE TONGUE IF NEEDED EVERY 5 MINUTES AS NEEDED FOR CHEST PAIN; DO NOT EXCEED 3 DOSES 25 tablet 2  . ondansetron (ZOFRAN) 4 MG tablet Take 1 tablet (4 mg total) by mouth every 8 (eight) hours as needed for nausea or vomiting. 20 tablet 0  . oxyCODONE-acetaminophen (PERCOCET) 10-325 MG tablet Take 1 tablet by mouth 5 (five) times daily. (scheduled)     . Polyvinyl Alcohol-Povidone (REFRESH OP) Place 1 drop into  both eyes 3 (three) times daily as needed (for dry eyes).     . potassium chloride (K-DUR) 10 MEQ tablet take 1 tablet by mouth once daily (Patient taking differently: Take 10 MEQ by mouth once daily) 90 tablet 3  . pregabalin (LYRICA) 100 MG capsule Take 100 mg by mouth 3 (three) times daily.    . rivaroxaban (XARELTO) 20 MG TABS tablet Take 20 mg by mouth once a day with supper (Patient taking differently: Take 20 mg by mouth daily with supper. ) 30 tablet 9  . tamsulosin (FLOMAX) 0.4 MG CAPS capsule Take 0.4 mg by mouth at bedtime.  0  . vitamin C (ASCORBIC ACID) 500 MG tablet Take 500 mg by mouth 2 (two) times daily.     No current facility-administered medications for this visit.     Physical Exam: Vitals:   07/06/18 1011  BP: 136/74  Pulse: 80  SpO2: 98%  Weight: 209 lb 6.4 oz (95 kg)  Height: 5\' 6"  (1.676 m)    GEN- The patient is well appearing, alert and oriented x 3 today.   Head- normocephalic, atraumatic Eyes-  Sclera clear, conjunctiva pink Ears- hearing intact Oropharynx- clear Lungs- Clear to ausculation bilaterally, normal work of breathing Heart- Regular rate and rhythm, no murmurs, rubs or gallops, PMI not laterally displaced GI- soft, NT, ND, + BS Extremities- no clubbing, cyanosis, or  edema  Wt Readings from Last 3 Encounters:  07/06/18 209 lb 6.4 oz (95 kg)  06/16/18 217 lb 12.8 oz (98.8 kg)  06/08/18 225 lb 3.2 oz (102.2 kg)    EKG tracing ordered today is personally reviewed and shows sinus rhythm 80 bpm, PR 176 msec, QRS 92 msec, QTc 456 msec  Assessment and Plan:  1. Paroxysmal atrial fibrillation/ atrial flutter Doing well s/p ablation She has palpitations and chest discomfort of unclear etiology. I would advise long term monitoring with an implanted loop recorder.  We discussed today the risks and benefits of ILR. She wishes to proceed.  We will therefore arrange ILR implantation for afib management post ablation and to further characterize recurrent presyncope/ palpitations.  2. Post termination pauses Resolved post ablation Will further evaluate with ILR as above  3. ? Pseudoseizures.  Dr Richrd HumblesPenumalli's notes reviewed ILR may be beneficial to exclude arrhythmia as the cause.  4. HTN Stable No change required today  Return in 3 months  Hillis RangeJames Rajinder Mesick MD, [facility] HospitalFACC 07/06/2018 10:26 AM

## 2018-07-12 ENCOUNTER — Encounter (HOSPITAL_COMMUNITY)
Admission: RE | Disposition: A | Discharge status: Home / Self Care | Payer: Self-pay | Source: Ambulatory Visit | Attending: Internal Medicine

## 2018-07-12 ENCOUNTER — Ambulatory Visit (HOSPITAL_COMMUNITY)
Admission: RE | Admit: 2018-07-12 | Discharge: 2018-07-12 | Disposition: A | Discharge status: Home / Self Care | Payer: Medicare Other | Source: Ambulatory Visit | Attending: Internal Medicine | Admitting: Internal Medicine

## 2018-07-12 ENCOUNTER — Other Ambulatory Visit: Payer: Self-pay

## 2018-07-12 DIAGNOSIS — K219 Gastro-esophageal reflux disease without esophagitis: Secondary | ICD-10-CM | POA: Diagnosis not present

## 2018-07-12 DIAGNOSIS — K589 Irritable bowel syndrome without diarrhea: Secondary | ICD-10-CM | POA: Diagnosis not present

## 2018-07-12 DIAGNOSIS — N183 Chronic kidney disease, stage 3 (moderate): Secondary | ICD-10-CM | POA: Insufficient documentation

## 2018-07-12 DIAGNOSIS — Z794 Long term (current) use of insulin: Secondary | ICD-10-CM | POA: Insufficient documentation

## 2018-07-12 DIAGNOSIS — R0789 Other chest pain: Secondary | ICD-10-CM | POA: Insufficient documentation

## 2018-07-12 DIAGNOSIS — M549 Dorsalgia, unspecified: Secondary | ICD-10-CM | POA: Insufficient documentation

## 2018-07-12 DIAGNOSIS — I129 Hypertensive chronic kidney disease with stage 1 through stage 4 chronic kidney disease, or unspecified chronic kidney disease: Secondary | ICD-10-CM | POA: Diagnosis not present

## 2018-07-12 DIAGNOSIS — F319 Bipolar disorder, unspecified: Secondary | ICD-10-CM | POA: Diagnosis not present

## 2018-07-12 DIAGNOSIS — Z7901 Long term (current) use of anticoagulants: Secondary | ICD-10-CM | POA: Insufficient documentation

## 2018-07-12 DIAGNOSIS — M797 Fibromyalgia: Secondary | ICD-10-CM | POA: Diagnosis not present

## 2018-07-12 DIAGNOSIS — G8929 Other chronic pain: Secondary | ICD-10-CM | POA: Diagnosis not present

## 2018-07-12 DIAGNOSIS — I480 Paroxysmal atrial fibrillation: Secondary | ICD-10-CM | POA: Insufficient documentation

## 2018-07-12 DIAGNOSIS — R002 Palpitations: Secondary | ICD-10-CM | POA: Insufficient documentation

## 2018-07-12 DIAGNOSIS — J45909 Unspecified asthma, uncomplicated: Secondary | ICD-10-CM | POA: Diagnosis not present

## 2018-07-12 DIAGNOSIS — G40909 Epilepsy, unspecified, not intractable, without status epilepticus: Secondary | ICD-10-CM | POA: Diagnosis not present

## 2018-07-12 DIAGNOSIS — F419 Anxiety disorder, unspecified: Secondary | ICD-10-CM | POA: Diagnosis not present

## 2018-07-12 DIAGNOSIS — M1990 Unspecified osteoarthritis, unspecified site: Secondary | ICD-10-CM | POA: Insufficient documentation

## 2018-07-12 DIAGNOSIS — I4891 Unspecified atrial fibrillation: Secondary | ICD-10-CM | POA: Diagnosis not present

## 2018-07-12 DIAGNOSIS — E1122 Type 2 diabetes mellitus with diabetic chronic kidney disease: Secondary | ICD-10-CM | POA: Insufficient documentation

## 2018-07-12 DIAGNOSIS — E785 Hyperlipidemia, unspecified: Secondary | ICD-10-CM | POA: Insufficient documentation

## 2018-07-12 HISTORY — PX: LOOP RECORDER INSERTION: EP1214

## 2018-07-12 LAB — GLUCOSE, CAPILLARY: GLUCOSE-CAPILLARY: 255 mg/dL — AB (ref 70–99)

## 2018-07-12 SURGERY — LOOP RECORDER INSERTION

## 2018-07-12 MED ORDER — LIDOCAINE HCL (PF) 1 % IJ SOLN
INTRAMUSCULAR | Status: DC | PRN
  Administered 2018-07-12: 5 mL

## 2018-07-12 MED ORDER — LIDOCAINE HCL (PF) 1 % IJ SOLN
INTRAMUSCULAR | Status: AC
  Filled 2018-07-12: qty 30

## 2018-07-12 SURGICAL SUPPLY — 2 items
LOOP REVEAL LINQSYS (Prosthesis & Implant Heart) ×2 IMPLANT
PACK LOOP INSERTION (CUSTOM PROCEDURE TRAY) ×3 IMPLANT

## 2018-07-12 NOTE — Interval H&P Note (Signed)
History and Physical Interval Note:  07/12/2018 7:27 AM  [name]  has presented today for surgery, with the diagnosis of sss - afib  The various methods of treatment have been discussed with the patient and family. After consideration of risks, benefits and other options for treatment, the patient has consented to  Procedure(s): LOOP RECORDER INSERTION (N/A) as a surgical intervention .  The patient's history has been reviewed, patient examined, no change in status, stable for surgery.  I have reviewed the patient's chart and labs.  Questions were answered to the patient's satisfaction.     [name]

## 2018-07-12 NOTE — Discharge Instructions (Signed)
LOOP RECORDER INSTRUCTION SHEET GIVEN. °

## 2018-07-13 ENCOUNTER — Encounter (HOSPITAL_COMMUNITY): Payer: Self-pay | Admitting: Internal Medicine

## 2018-07-18 ENCOUNTER — Telehealth: Payer: Self-pay | Admitting: *Deleted

## 2018-07-18 NOTE — Telephone Encounter (Signed)
Spoke with patient to obtain manual transmission for review. Transmission successful, "AF" episodes all appear false--SR w/PACs. ECGs printed for review by Dr. Johney FrameAllred.

## 2018-07-18 NOTE — Telephone Encounter (Signed)
Per Dr. Johney FrameAllred, plan to reprogram AF episode storage to >=46min at upcoming wound check. Appointment notes updated.

## 2018-07-21 ENCOUNTER — Other Ambulatory Visit: Payer: Self-pay

## 2018-07-21 ENCOUNTER — Other Ambulatory Visit: Payer: Self-pay | Admitting: Internal Medicine

## 2018-07-21 ENCOUNTER — Ambulatory Visit (INDEPENDENT_AMBULATORY_CARE_PROVIDER_SITE_OTHER): Payer: Medicare Other | Admitting: *Deleted

## 2018-07-21 DIAGNOSIS — R002 Palpitations: Secondary | ICD-10-CM

## 2018-07-21 LAB — CUP PACEART INCLINIC DEVICE CHECK
MDC IDC PG IMPLANT DT: 20191126
MDC IDC SESS DTM: 20191205172150

## 2018-07-21 MED ORDER — RIVAROXABAN 20 MG PO TABS: ORAL_TABLET | ORAL | 6 refills | Status: DC

## 2018-07-21 NOTE — Patient Instructions (Signed)
Call the Device Clinic at [phone] if you have to use your symptom activator so that we can discuss your symptoms.

## 2018-07-21 NOTE — Progress Notes (Signed)
Wound check appointment. Steri-strips removed. Wound without redness or edema. Incision edges approximated, wound well healed. Normal device function. Battery status: good. R-waves 0.4724mV. No symptom, tachy, or brady episodes. 2 pause episodes--false, from date/time of implant 6 AF episodes--ECGs show SR w/PACs. AT/AF recording threshold extended to >=436min per JA. Patient educated about wound care, symptom activator, and Carelink monitor. Monthly summary reports and ROV with JA on 10/17/18.

## 2018-07-21 NOTE — Telephone Encounter (Signed)
Pt last saw Dr Johney FrameAllred 07/06/18, last labs 06/03/18 Creat 1.55, age 67, weight 95kg, CrCl 53.54, based on CrCl pt is on appropriate dosage of Xarelto 20mg  QD.  Will refill rx.

## 2018-07-28 ENCOUNTER — Ambulatory Visit: Payer: Medicare Other

## 2018-07-29 ENCOUNTER — Ambulatory Visit (INDEPENDENT_AMBULATORY_CARE_PROVIDER_SITE_OTHER): Payer: Medicare Other | Admitting: *Deleted

## 2018-07-29 ENCOUNTER — Telehealth: Payer: Self-pay | Admitting: Cardiology

## 2018-07-29 DIAGNOSIS — R55 Syncope and collapse: Secondary | ICD-10-CM

## 2018-07-29 NOTE — Telephone Encounter (Signed)
Patient called in and stated that her device site still hurts. Patient states that it a sharp pain under her breast where the loop recorder is located. She states that she can feel a bump under her breast that it feels like it is poking out. I asked patient if she could see if it is red or swollen and patient stated that is not red but she can see the bump, like something is sticking out. Patient agreed to an appt today at 2:30 PM.

## 2018-07-29 NOTE — Progress Notes (Signed)
LINQ wound check d/t c/o of pain, incision site assessed no redness, drainage or swelling noted. Incision intact, explained to pt that she could have pain for several more weeks d/t tissue around the device. Advised pt check incision site daily and to call if redness, swelling or drainage. Advised pt to try Tylenol for the pain. Pt voiced understanding.

## 2018-08-08 ENCOUNTER — Ambulatory Visit: Payer: Medicare Other

## 2018-08-15 ENCOUNTER — Ambulatory Visit (INDEPENDENT_AMBULATORY_CARE_PROVIDER_SITE_OTHER): Payer: Medicare Other

## 2018-08-15 DIAGNOSIS — R002 Palpitations: Secondary | ICD-10-CM | POA: Diagnosis not present

## 2018-08-15 NOTE — Progress Notes (Signed)
Carelink Summary Report / Loop Recorder 

## 2018-08-16 LAB — CUP PACEART REMOTE DEVICE CHECK
Implantable Pulse Generator Implant Date: 20191126
MDC IDC SESS DTM: 20191229123924

## 2018-08-24 ENCOUNTER — Other Ambulatory Visit: Payer: Self-pay | Admitting: Internal Medicine

## 2018-08-26 ENCOUNTER — Other Ambulatory Visit: Payer: Self-pay | Admitting: Pulmonary Disease

## 2018-08-26 DIAGNOSIS — J4530 Mild persistent asthma, uncomplicated: Secondary | ICD-10-CM

## 2018-09-13 ENCOUNTER — Ambulatory Visit: Payer: Medicare Other

## 2018-09-15 ENCOUNTER — Ambulatory Visit (INDEPENDENT_AMBULATORY_CARE_PROVIDER_SITE_OTHER): Payer: Medicare Other

## 2018-09-15 VITALS — BP 146/80 | HR 72 | Temp 98.7°F | Ht 66.0 in | Wt 219.0 lb

## 2018-09-15 DIAGNOSIS — Z1211 Encounter for screening for malignant neoplasm of colon: Secondary | ICD-10-CM | POA: Diagnosis not present

## 2018-09-15 DIAGNOSIS — Z0000 Encounter for general adult medical examination without abnormal findings: Secondary | ICD-10-CM

## 2018-09-15 NOTE — Patient Instructions (Addendum)
[name] , Thank you for taking time to come for your Medicare Wellness Visit. I appreciate your ongoing commitment to your health goals. Please review the following plan we discussed and let me know if I can assist you in the future.   These are the goals we discussed: Goals    . Patient Stated     Try to walk a little farther everyday.        This is a list of the screening recommended for you and due dates:  Health Maintenance  Topic Date Due  . Colon Cancer Screening  09/22/2016  . Eye exam for diabetics  10/23/2018  . Hemoglobin A1C  12/01/2018  . Complete foot exam   02/15/2019  . Mammogram  06/25/2019  . Tetanus Vaccine  05/21/2025  . Flu Shot  Completed  . DEXA scan (bone density measurement)  Completed  .  Hepatitis C: One time screening is recommended by Center for Disease Control  (CDC) for  adults born from [birth year] through 1964.   Completed  . Pneumonia vaccines  Completed     Fall Prevention in the Home, Adult Falls can cause injuries. They can happen to people of all ages. There are many things you can do to make your home safe and to help prevent falls. Ask for help when making these changes, if needed. What actions can I take to prevent falls? General Instructions  Use good lighting in all rooms. Replace any light bulbs that burn out.  Turn on the lights when you go into a dark area. Use night-lights.  Keep items that you use often in easy-to-reach places. Lower the shelves around your home if necessary.  Set up your furniture so you have a clear path. Avoid moving your furniture around.  Do not have throw rugs and other things on the floor that can make you trip.  Avoid walking on wet floors.  If any of your floors are uneven, fix them.  Add color or contrast paint or tape to clearly mark and help you see: ? Any grab bars or handrails. ? First and last steps of stairways. ? Where the edge of each step is.  If you use a stepladder: ? Make sure  that it is fully opened. Do not climb a closed stepladder. ? Make sure that both sides of the stepladder are locked into place. ? Ask someone to hold the stepladder for you while you use it.  If there are any pets around you, be aware of where they are. What can I do in the bathroom?      Keep the floor dry. Clean up any water that spills onto the floor as soon as it happens.  Remove soap buildup in the tub or shower regularly.  Use non-skid mats or decals on the floor of the tub or shower.  Attach bath mats securely with double-sided, non-slip rug tape.  If you need to sit down in the shower, use a plastic, non-slip stool.  Install grab bars by the toilet and in the tub and shower. Do not use towel bars as grab bars. What can I do in the bedroom?  Make sure that you have a light by your bed that is easy to reach.  Do not use any sheets or blankets that are too big for your bed. They should not hang down onto the floor.  Have a firm chair that has side arms. You can use this for support while you get dressed.  What can I do in the kitchen?  Clean up any spills right away.  If you need to reach something above you, use a strong step stool that has a grab bar.  Keep electrical cords out of the way.  Do not use floor polish or wax that makes floors slippery. If you must use wax, use non-skid floor wax. What can I do with my stairs?  Do not leave any items on the stairs.  Make sure that you have a light switch at the top of the stairs and the bottom of the stairs. If you do not have them, ask someone to add them for you.  Make sure that there are handrails on both sides of the stairs, and use them. Fix handrails that are broken or loose. Make sure that handrails are as long as the stairways.  Install non-slip stair treads on all stairs in your home.  Avoid having throw rugs at the top or bottom of the stairs. If you do have throw rugs, attach them to the floor with carpet  tape.  Choose a carpet that does not hide the edge of the steps on the stairway.  Check any carpeting to make sure that it is firmly attached to the stairs. Fix any carpet that is loose or worn. What can I do on the outside of my home?  Use bright outdoor lighting.  Regularly fix the edges of walkways and driveways and fix any cracks.  Remove anything that might make you trip as you walk through a door, such as a raised step or threshold.  Trim any bushes or trees on the path to your home.  Regularly check to see if handrails are loose or broken. Make sure that both sides of any steps have handrails.  Install guardrails along the edges of any raised decks and porches.  Clear walking paths of anything that might make someone trip, such as tools or rocks.  Have any leaves, snow, or ice cleared regularly.  Use sand or salt on walking paths during winter.  Clean up any spills in your garage right away. This includes grease or oil spills. What other actions can I take?  Wear shoes that: ? Have a low heel. Do not wear high heels. ? Have rubber bottoms. ? Are comfortable and fit you well. ? Are closed at the toe. Do not wear open-toe sandals.  Use tools that help you move around (mobility aids) if they are needed. These include: ? Canes. ? Walkers. ? Scooters. ? Crutches.  Review your medicines with your doctor. Some medicines can make you feel dizzy. This can increase your chance of falling. Ask your doctor what other things you can do to help prevent falls. Where to find more information  Centers for Disease Control and Prevention, [facility]: https://garcia.biz/  [employer] on Aging: BrainJudge.co.uk Contact a doctor if:  You are afraid of falling at home.  You feel weak, drowsy, or dizzy at home.  You fall at home. Summary  There are many simple things that you can do to make your home safe and to help prevent falls.  Ways to make your home safe  include removing tripping hazards and installing grab bars in the bathroom.  Ask for help when making these changes in your home. This information is not intended to replace advice given to you by your health care provider. Make sure you discuss any questions you have with your health care provider. Document Released: 05/30/2009 Document Revised: 03/18/2017  Document Reviewed: 03/18/2017 Elsevier Interactive Patient Education  2019 [employer] Maintenance, Female Adopting a healthy lifestyle and getting preventive care can go a long way to promote health and wellness. Talk with your health care provider about what schedule of regular examinations is right for you. This is a good chance for you to check in with your provider about disease prevention and staying healthy. In between checkups, there are plenty of things you can do on your own. Experts have done a lot of research about which lifestyle changes and preventive measures are most likely to keep you healthy. Ask your health care provider for more information. Weight and diet Eat a healthy diet  Be sure to include plenty of vegetables, fruits, low-fat dairy products, and lean protein.  Do not eat a lot of foods high in solid fats, added sugars, or salt.  Get regular exercise. This is one of the most important things you can do for your health. ? Most adults should exercise for at least 150 minutes each week. The exercise should increase your heart rate and make you sweat (moderate-intensity exercise). ? Most adults should also do strengthening exercises at least twice a week. This is in addition to the moderate-intensity exercise. Maintain a healthy weight  Body mass index (BMI) is a measurement that can be used to identify possible weight problems. It estimates body fat based on height and weight. Your health care provider can help determine your BMI and help you achieve or maintain a healthy weight.  For females 31 years of  age and older: ? A BMI below 18.5 is considered underweight. ? A BMI of 18.5 to 24.9 is normal. ? A BMI of 25 to 29.9 is considered overweight. ? A BMI of 30 and above is considered obese. Watch levels of cholesterol and blood lipids  You should start having your blood tested for lipids and cholesterol at 67 years of age, then have this test every 5 years.  You may need to have your cholesterol levels checked more often if: ? Your lipid or cholesterol levels are high. ? You are older than 67 years of age. ? You are at high risk for heart disease. Cancer screening Lung Cancer  Lung cancer screening is recommended for adults 55-19 years old who are at high risk for lung cancer because of a history of smoking.  A yearly low-dose CT scan of the lungs is recommended for people who: ? Currently smoke. ? Have quit within the past 15 years. ? Have at least a 30-pack-year history of smoking. A pack year is smoking an average of one pack of cigarettes a day for 1 year.  Yearly screening should continue until it has been 15 years since you quit.  Yearly screening should stop if you develop a health problem that would prevent you from having lung cancer treatment. Breast Cancer  Practice breast self-awareness. This means understanding how your breasts normally appear and feel.  It also means doing regular breast self-exams. Let your health care provider know about any changes, no matter how small.  If you are in your 20s or 30s, you should have a clinical breast exam (CBE) by a health care provider every 1-3 years as part of a regular health exam.  If you are 101 or older, have a CBE every year. Also consider having a breast X-ray (mammogram) every year.  If you have a family history of breast cancer, talk to your health care provider about genetic  screening.  If you are at high risk for breast cancer, talk to your health care provider about having an MRI and a mammogram every  year.  Breast cancer gene (BRCA) assessment is recommended for women who have family members with BRCA-related cancers. BRCA-related cancers include: ? Breast. ? Ovarian. ? Tubal. ? Peritoneal cancers.  Results of the assessment will determine the need for genetic counseling and BRCA1 and BRCA2 testing. Cervical Cancer Your health care provider may recommend that you be screened regularly for cancer of the pelvic organs (ovaries, uterus, and vagina). This screening involves a pelvic examination, including checking for microscopic changes to the surface of your cervix (Pap test). You may be encouraged to have this screening done every 3 years, beginning at age 27.  For women ages 30-65, health care providers may recommend pelvic exams and Pap testing every 3 years, or they may recommend the Pap and pelvic exam, combined with testing for human papilloma virus (HPV), every 5 years. Some types of HPV increase your risk of cervical cancer. Testing for HPV may also be done on women of any age with unclear Pap test results.  Other health care providers may not recommend any screening for nonpregnant women who are considered low risk for pelvic cancer and who do not have symptoms. Ask your health care provider if a screening pelvic exam is right for you.  If you have had past treatment for cervical cancer or a condition that could lead to cancer, you need Pap tests and screening for cancer for at least 20 years after your treatment. If Pap tests have been discontinued, your risk factors (such as having a new sexual partner) need to be reassessed to determine if screening should resume. Some women have medical problems that increase the chance of getting cervical cancer. In these cases, your health care provider may recommend more frequent screening and Pap tests. Colorectal Cancer  This type of cancer can be detected and often prevented.  Routine colorectal cancer screening usually begins at 67 years of  age and continues through 67 years of age.  Your health care provider may recommend screening at an earlier age if you have risk factors for colon cancer.  Your health care provider may also recommend using home test kits to check for hidden blood in the stool.  A small camera at the end of a tube can be used to examine your colon directly (sigmoidoscopy or colonoscopy). This is done to check for the earliest forms of colorectal cancer.  Routine screening usually begins at age 21.  Direct examination of the colon should be repeated every 5-10 years through 67 years of age. However, you may need to be screened more often if early forms of precancerous polyps or small growths are found. Skin Cancer  Check your skin from head to toe regularly.  Tell your health care provider about any new moles or changes in moles, especially if there is a change in a mole's shape or color.  Also tell your health care provider if you have a mole that is larger than the size of a pencil eraser.  Always use sunscreen. Apply sunscreen liberally and repeatedly throughout the day.  Protect yourself by wearing long sleeves, pants, a wide-brimmed hat, and sunglasses whenever you are outside. Heart disease, diabetes, and high blood pressure  High blood pressure causes heart disease and increases the risk of stroke. High blood pressure is more likely to develop in: ? People who have blood pressure in  the high end of the normal range (130-139/85-89 mm Hg). ? People who are overweight or obese. ? People who are African American.  If you are 1-69 years of age, have your blood pressure checked every 3-5 years. If you are 66 years of age or older, have your blood pressure checked every year. You should have your blood pressure measured twice-once when you are at a hospital or clinic, and once when you are not at a hospital or clinic. Record the average of the two measurements. To check your blood pressure when you are  not at a hospital or clinic, you can use: ? An automated blood pressure machine at a pharmacy. ? A home blood pressure monitor.  If you are between 81 years and 14 years old, ask your health care provider if you should take aspirin to prevent strokes.  Have regular diabetes screenings. This involves taking a blood sample to check your fasting blood sugar level. ? If you are at a normal weight and have a low risk for diabetes, have this test once every three years after 67 years of age. ? If you are overweight and have a high risk for diabetes, consider being tested at a younger age or more often. Preventing infection Hepatitis B  If you have a higher risk for hepatitis B, you should be screened for this virus. You are considered at high risk for hepatitis B if: ? You were born in a country where hepatitis B is common. Ask your health care provider which countries are considered high risk. ? Your parents were born in a high-risk country, and you have not been immunized against hepatitis B (hepatitis B vaccine). ? You have HIV or AIDS. ? You use needles to inject street drugs. ? You live with someone who has hepatitis B. ? You have had sex with someone who has hepatitis B. ? You get hemodialysis treatment. ? You take certain medicines for conditions, including cancer, organ transplantation, and autoimmune conditions. Hepatitis C  Blood testing is recommended for: ? Everyone born from [birth year] through 1964. ? Anyone with known risk factors for hepatitis C. Sexually transmitted infections (STIs)  You should be screened for sexually transmitted infections (STIs) including gonorrhea and chlamydia if: ? You are sexually active and are younger than 67 years of age. ? You are older than 67 years of age and your health care provider tells you that you are at risk for this type of infection. ? Your sexual activity has changed since you were last screened and you are at an increased risk for chlamydia  or gonorrhea. Ask your health care provider if you are at risk.  If you do not have HIV, but are at risk, it may be recommended that you take a prescription medicine daily to prevent HIV infection. This is called pre-exposure prophylaxis (PrEP). You are considered at risk if: ? You are sexually active and do not regularly use condoms or know the HIV status of your partner(s). ? You take drugs by injection. ? You are sexually active with a partner who has HIV. Talk with your health care provider about whether you are at high risk of being infected with HIV. If you choose to begin PrEP, you should first be tested for HIV. You should then be tested every 3 months for as long as you are taking PrEP. Pregnancy  If you are premenopausal and you may become pregnant, ask your health care provider about preconception counseling.  If you may  become pregnant, take 400 to 800 micrograms (mcg) of folic acid every day.  If you want to prevent pregnancy, talk to your health care provider about birth control (contraception). Osteoporosis and menopause  Osteoporosis is a disease in which the bones lose minerals and strength with aging. This can result in serious bone fractures. Your risk for osteoporosis can be identified using a bone density scan.  If you are 36 years of age or older, or if you are at risk for osteoporosis and fractures, ask your health care provider if you should be screened.  Ask your health care provider whether you should take a calcium or vitamin D supplement to lower your risk for osteoporosis.  Menopause may have certain physical symptoms and risks.  Hormone replacement therapy may reduce some of these symptoms and risks. Talk to your health care provider about whether hormone replacement therapy is right for you. Follow these instructions at home:  Schedule regular health, dental, and eye exams.  Stay current with your immunizations.  Do not use any tobacco products including  cigarettes, chewing tobacco, or electronic cigarettes.  If you are pregnant, do not drink alcohol.  If you are breastfeeding, limit how much and how often you drink alcohol.  Limit alcohol intake to no more than 1 drink per day for nonpregnant women. One drink equals 12 ounces of beer, 5 ounces of wine, or 1 ounces of hard liquor.  Do not use street drugs.  Do not share needles.  Ask your health care provider for help if you need support or information about quitting drugs.  Tell your health care provider if you often feel depressed.  Tell your health care provider if you have ever been abused or do not feel safe at home. This information is not intended to replace advice given to you by your health care provider. Make sure you discuss any questions you have with your health care provider. Document Released: 02/16/2011 Document Revised: 01/09/2016 Document Reviewed: 05/07/2015 Elsevier Interactive Patient Education  2019 [employer].

## 2018-09-15 NOTE — Progress Notes (Signed)
Subjective:   Toni RosenthalLinda F [name] is a 68 y.o. female who presents for an Initial Medicare Annual Wellness Visit.  Review of Systems    Physical assessment deferred to PCP.   Cardiac Risk Factors include: advanced age (>3655men, 34>65 women);diabetes mellitus;obesity (BMI >30kg/m2);hypertension     Objective:    Today's Vitals   09/15/18 1041  BP: (!) 146/80  Pulse: 72  Temp: 98.7 F (37.1 C)  TempSrc: Oral  SpO2: 98%  Weight: 219 lb (99.3 kg)  Height: 5\' 6"  (1.676 m)  PainSc: 5   PainLoc: Back   Body mass index is 35.35 kg/m.  Advanced Directives 09/15/2018 06/16/2018 06/01/2018 06/01/2018 04/21/2018 01/05/2018 12/22/2017  Does Patient Have a Medical Advance Directive? No No No No No Yes Yes  Type of Advance Directive - - - - - Psychologist, occupationalHealthcare Power of Attorney Healthcare Power of Attorney  Does patient want to make changes to medical advance directive? - - - - - No - Patient declined No - Patient declined  Copy of Healthcare Power of Attorney in Chart? - - - - - No - copy requested No - copy requested  Would patient like information on creating a medical advance directive? No - Patient declined No - Patient declined No - Patient declined No - Patient declined No - Patient declined No - Patient declined -  Pre-existing out of facility DNR order (yellow form or pink MOST form) - - - - - - -    Current Medications (verified) Outpatient Encounter Medications as of 09/15/2018  Medication Sig  . ACCU-CHEK GUIDE test strip AS DIRECTED QID IN VITRO  . albuterol (PROAIR HFA) 108 (90 Base) MCG/ACT inhaler inhale 2 puffs every 6 hours if needed for wheezing or shortness of breath (Patient taking differently: Inhale 2 puffs into the lungs every 6 (six) hours as needed for wheezing or shortness of breath. )  . albuterol (PROVENTIL) (2.5 MG/3ML) 0.083% nebulizer solution Take 3 mLs (2.5 mg total) by nebulization every 4 (four) hours as needed for wheezing or shortness of breath (dx: J45.31).  Marland Kitchen.  amLODipine (NORVASC) 10 MG tablet Take 1 tablet (10 mg total) by mouth daily.  Marland Kitchen. atorvastatin (LIPITOR) 80 MG tablet Take 80 mg by mouth at bedtime.  . calcium carbonate (OS-CAL) 600 MG TABS tablet Take 600 mg by mouth daily.  . cholecalciferol (VITAMIN D) 1000 UNITS tablet Take 1,000 Units by mouth 2 (two) times daily.   . Cranberry 500 MG CAPS Take 500 mg by mouth 2 (two) times daily.  . diclofenac sodium (VOLTAREN) 1 % GEL APPLY 2 INCHES TO AFFECTED AREA THREE TIMES A DAY (Patient taking differently: Apply 1 application topically 4 (four) times daily. )  . diphenoxylate-atropine (LOMOTIL) 2.5-0.025 MG tablet TAKE 1 TABLET BY MOUTH TWICE DAILY AS NEEDED FOR DIARRHEA OR LOOSE STOOLS (Patient taking differently: Take 1 tablet by mouth 2 (two) times daily as needed for diarrhea or loose stools. )  . DULERA 200-5 MCG/ACT AERO INHALE 2 PUFFS INTO THE LUNGS TWICE DAILY  . DULoxetine (CYMBALTA) 60 MG capsule Take 60 mg by mouth at bedtime.   Marland Kitchen. esomeprazole (NEXIUM) 40 MG capsule TAKE 1 CAPSULE(40 MG) BY MOUTH TWICE DAILY 30 MINUTES BEFORE BREAKFAST AND DINNER (Patient taking differently: Take 40 mg by mouth 2 (two) times daily before a meal. )  . EVZIO 0.4 MG/0.4ML SOAJ Inject 0.4 mLs as directed daily as needed (for overdose).   . fluticasone (FLONASE) 50 MCG/ACT nasal spray instill 2 sprays  into each nostril once daily (Patient taking differently: Place 2 sprays into both nostrils 2 (two) times daily. )  . HUMALOG KWIKPEN 100 UNIT/ML KiwkPen Inject 18-32 Units into the skin 2 (two) times daily. PER Patient home SLIDING SCALE 18 units if CBG < 200 32 units  If CBG  > 200  . Insulin Glargine (LANTUS SOLOSTAR) 100 UNIT/ML Solostar Pen Inject 40 Units into the skin at bedtime. (Patient taking differently: Inject 50 Units into the skin at bedtime. )  . irbesartan (AVAPRO) 75 MG tablet Take 75 mg by mouth daily.   Marland Kitchen loratadine (CLARITIN) 10 MG tablet TAKE 1 TABLET BY MOUTH ONCE DAILY (Patient taking  differently: Take 10 mg by mouth daily. )  . magnesium oxide (MAG-OX) 400 MG tablet TAKE 1 TABLET BY MOUTH EVERY DAY (Patient taking differently: Take 400 mg by mouth daily. )  . nitroGLYCERIN (NITROSTAT) 0.4 MG SL tablet PLACE 1 TABLET UNDER THE TONGUE IF NEEDED EVERY 5 MINUTES AS NEEDED FOR CHEST PAIN; DO NOT EXCEED 3 DOSES (Patient taking differently: Place 0.4 mg under the tongue every 5 (five) minutes as needed for chest pain. )  . ondansetron (ZOFRAN) 4 MG tablet Take 1 tablet (4 mg total) by mouth every 8 (eight) hours as needed for nausea or vomiting.  Marland Kitchen oxyCODONE-acetaminophen (PERCOCET) 10-325 MG tablet Take 1 tablet by mouth 5 (five) times daily.   . Polyvinyl Alcohol-Povidone PF (REFRESH) 1.4-0.6 % SOLN Place 1 drop into both eyes 3 (three) times daily.   . potassium chloride (K-DUR) 10 MEQ tablet take 1 tablet by mouth once daily (Patient taking differently: Take 10 mEq by mouth daily. )  . pregabalin (LYRICA) 100 MG capsule Take 100 mg by mouth 3 (three) times daily.  . rivaroxaban (XARELTO) 20 MG TABS tablet Take 20 mg by mouth once a day with supper  . tamsulosin (FLOMAX) 0.4 MG CAPS capsule Take 0.4 mg by mouth at bedtime.  . vitamin C (ASCORBIC ACID) 500 MG tablet Take 500 mg by mouth 2 (two) times daily.  . BD PEN NEEDLE NANO U/F 32G X 4 MM MISC USE TO ADMINISTER INSULIN 4 TIMES DAILY  . mometasone-formoterol (DULERA) 100-5 MCG/ACT AERO Inhale 2 puffs into the lungs 2 (two) times daily.   No facility-administered encounter medications on file as of 09/15/2018.     Allergies (verified) Prednisone; Amitriptyline; Hydromorphone hcl; and Lisinopril   History: Past Medical History:  Diagnosis Date  . Anxiety   . Arthritis    "knees, hands, ankles, feet" (05/12/2016)  . Asthma    Dr. [name]  . Atrial fibrillation (HCC)   . Bipolar disorder (HCC)   . Chronic back pain    "lower and middle" (05/12/2016)  . Chronic kidney disease (CKD), stage III (moderate) (HCC) 05/14/2015  .  CKD (chronic kidney disease), stage III (HCC)   . Colon polyps   . Depression   . Diverticulosis   . Epilepsy (HCC)   . Essential hypertension   . Fibromyalgia   . Gallstones   . GERD (gastroesophageal reflux disease)   . H/O hiatal hernia   . Hyperlipidemia   . IBS (irritable bowel syndrome)   . Insulin dependent diabetes mellitus (HCC)   . Paroxysmal A-fib (HCC)    failed medical therapy with tikosyn, s/p AF ablation x 2  . Paroxysmal A-fib (HCC)    a. On [name]. b. Recurrence 08/2014 in setting of GI illness.  . Paroxysmal atrial fibrillation (HCC) 05/18/2017  . Peripheral neuropathy   .  Persistent atrial fibrillation   . Sarcoidosis of lung (HCC)    Dr. [name]  . Seizures (HCC)    "epileptic; pretty regular recently" (05/12/2016)  . Syncope   . Type II diabetes mellitus (HCC)   . Type II diabetes mellitus (HCC)    Past Surgical History:  Procedure Laterality Date  . ABDOMINAL HYSTERECTOMY  1995  . ANTERIOR CERVICAL DECOMP/DISCECTOMY FUSION  07/11/2012   Procedure: ANTERIOR CERVICAL DECOMPRESSION/DISCECTOMY FUSION 1 LEVEL;  Surgeon: [name], MD;  Location: [facility];  Service: Neurosurgery;  Laterality: N/A;  Cervical Five-Six Anterior Cervical Decompression with Fusion Interbody Prothesis Plating and Bonegraft  . ATRIAL FIBRILLATION ABLATION N/A 05/18/2017   Procedure: Atrial Fibrillation Ablation;  Surgeon: [name], MD;  Location: [facility];  Service: Cardiovascular;  Laterality: N/A;  . ATRIAL FIBRILLATION ABLATION N/A 11/16/2017   Procedure: ATRIAL FIBRILLATION ABLATION;  Surgeon: [name], MD;  Location: [facility];  Service: Cardiovascular;  Laterality: N/A;  . CARDIAC CATHETERIZATION  2012   a. Normal coronaries 2012. b. Normal nuc 09/2014.  Marland Kitchen CARDIAC CATHETERIZATION  05/18/2017  . CHOLECYSTECTOMY OPEN  1976  . CLOSED REDUCTION ANKLE FRACTURE Left 10/2006   "got steel rod in my leg; and screws"  . COLON SURGERY    . DILATION AND  CURETTAGE OF UTERUS    . ESOPHAGEAL MANOMETRY  03/21/2012   Procedure: ESOPHAGEAL MANOMETRY (EM);  Surgeon: [name], MD;  Location: [facility];  Service: Endoscopy;  Laterality: N/A;  . FRACTURE SURGERY    . LOOP RECORDER INSERTION N/A 07/12/2018   Procedure: LOOP RECORDER INSERTION;  Surgeon: [name], MD;  Location: [facility];  Service: Cardiovascular;  Laterality: N/A;  . NEUROPLASTY / TRANSPOSITION MEDIAN NERVE AT CARPAL TUNNEL Left 2004  . NEUROPLASTY / TRANSPOSITION MEDIAN NERVE AT CARPAL TUNNEL Right 2002  . 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 SALPINGOOPHORECTOMY Bilateral 2000  . TEE WITHOUT CARDIOVERSION N/A 05/18/2017   Procedure: TRANSESOPHAGEAL ECHOCARDIOGRAM (TEE);  Surgeon: [name], MD;  Location[facility] ENDOS[facility];  Service: Cardiovascular;  Laterality: N/A;  . TUBAL LIGATION  1982   Family History  Problem Relation Age of Onset  . Heart attack Mother        @ age 31  . Mental illness Mother   . Diabetes Mother   . Hypertension Mother        siblings  . Alzheimer's disease Mother   . Depression Mother   . Hyperlipidemia Mother   . Heart attack Brother 50  . Alcohol abuse Brother   . Depression Brother   . Diabetes Brother   . Hyperlipidemia Brother   . Hypertension Brother   . Kidney disease Brother   . Drug abuse Brother   . Alcohol abuse Father   . Heart attack Father   . Hyperlipidemia Father   . Hypertension Father   . Colon cancer Maternal Aunt   . Prostate cancer Maternal Grandfather   . Diabetes Maternal Grandfather   . Hyperlipidemia Maternal Grandfather   . Ovarian cancer Maternal Aunt   . Diabetes Other   . Hypertension Other   . Lupus Sister   . Alcohol abuse Sister   . Depression Sister   . Diabetes Sister   . Hyperlipidemia Sister   . Hypertension Sister   . Kidney disease Sister   . Drug abuse Sister   . Ovarian cancer Cousin   . Diabetes Maternal Grandmother   . Hyperlipidemia Maternal Grandmother   .  Breast cancer Neg Hx    Social History   Socioeconomic History  . Marital status: Divorced    Spouse name: Not on file  . Number of children: 3  . Years of education: 12th  . Highest education level: 12th grade  Occupational History  . Occupation: disabled    Comment: CNA  Social Needs  . Financial resource strain: Hard  . Food insecurity:    Worry: Sometimes true    Inability: Sometimes true  . Transportation needs:    Medical: Yes    Non-medical: Yes  Tobacco Use  . Smoking status: Never Smoker  . Smokeless tobacco: Never Used  Substance and Sexual Activity  . Alcohol use: No  . Drug use: No  . Sexual activity: Not Currently  Lifestyle  . Physical activity:    Days per week: 3 days    Minutes per session: 20 min  . Stress: To some extent  Relationships  . Social connections:    Talks on phone: Twice a week    Gets together: Once a week    Attends religious service: More than 4 times per year    Active member of club or organization: Yes    Attends meetings of clubs or organizations: More than 4 times per year    Relationship status: Divorced  Other Topics Concern  . Not on file  Social History Narrative   Patient lives in house with friend/daughter. Has three daughters, 50 grand children, 8 great-grandchildren. Has some steps, has a handrail on steps. She is single, has three children, is disabled.   No pets. Smoke alarms present. Does not have her own car but is saving to buy one.       Attends church regularly, tries to walk everyday. Likes to participate in church activities and interact with family.    Tobacco Counseling Counseling given: No   Clinical Intake:  Pre-visit preparation completed: Yes  Pain : 0-10 Pain Score: 5  Pain Type: Chronic pain Pain Location: Back Pain Orientation: Lower Pain Onset: More than a month ago Pain Frequency: Constant     Nutritional Status: BMI > 30  Obese Nutritional Risks: None Diabetes: Yes CBG done?:  No Did pt. bring in CBG monitor from home?: No  What is the last grade level you completed in school?: 12th grade; some college  Interpreter Needed?: No      Activities of Daily Living In your present state of health, do you have any difficulty performing the following activities: 09/15/2018 06/01/2018  Hearing? N N  Vision? N N  Difficulty concentrating or making decisions? N N  Comment - -  Walking or climbing stairs? Y N  Dressing or bathing? N N  Comment takes a while but she does it herself -  Doing errands, shopping? N N  Comment - -  Quarry manager and eating ? N -  Using the Toilet? N -  In the past six months, have you accidently leaked urine? N -  Do you have problems with loss of bowel control? N -  Managing your Medications? N -  Managing your Finances? N -  Housekeeping or managing your Housekeeping? N -  Some recent data might be hidden     Immunizations and Health Maintenance Immunization History  Administered Date(s) Administered  . Influenza, High Dose Seasonal PF 05/19/2017  . Influenza,inj,Quad PF,6+ Mos 05/02/2015, 05/13/2016, 04/21/2018  . Influenza-Unspecified 05/18/2011, 05/17/2014  . Pneumococcal Conjugate-13 09/13/2015  . Pneumococcal Polysaccharide-23 07/12/2012, 11/17/2017  .  Tdap 05/22/2015   Health Maintenance Due  Topic Date Due  . COLONOSCOPY  09/22/2016    Patient Care Team: Leeroy Bock, DO as PCP - General [name], MD as Referring Physician (Gastroenterology) Talmage Coin, MD as Consulting Physician (Endocrinology) Chilton Greathouse, MD as Consulting Physician (Pulmonary Disease) Tyler Pita, MD as Attending Physician (Internal Medicine) [facility] [place] [facility], Glenford Bayley, MD as Consulting Physician (Neurology) [name], MD as Consulting Physician (Cardiology) [facility] Viv[facility] Martens  Indicate any recent Medical Services you  may have received from other than [facility] providers in the past year (date may be approximate).     Assessment:   This is a routine wellness examination for [place].  Hearing/Vision screen  Hearing Screening   Method: Audiometry   125Hz  250Hz  500Hz  1000Hz  2000Hz  3000Hz  4000Hz  6000Hz  8000Hz   Right ear:   Pass Pass Pass  Pass    Left ear:   Pass Pass Pass  Pass      Visual Acuity Screening   Right eye Left eye Both eyes  Without correction: 20/30 20/30 20/30   With correction:       Dietary issues and exercise activities discussed: Current Exercise Habits: The patient does not participate in regular exercise at present, Exercise limited by: None identified  Goals    . Patient Stated     Try to walk a little farther everyday.       Depression Screen PHQ 2/9 Scores 09/15/2018 06/16/2018 04/21/2018 02/14/2018 01/05/2018 09/09/2017 08/18/2017  PHQ - 2 Score - 0 6 - 0 0 1  PHQ- 9 Score - - 17 - - - -  Exception Documentation Other- indicate reason in comment box - - Other- indicate reason in comment box - - -  Not completed bipolar disorder - - PHQ-9 form filled out in person and scanned into chart - - -    Fall Risk Fall Risk  09/15/2018 06/16/2018 06/08/2018 01/05/2018 09/09/2017  Falls in the past year? 0 No No No No  Number falls in past yr: - - - - -  Injury with Fall? - - - - -  Comment - - - - -  Risk for fall due to : - - - - -  Risk for fall due to: Comment - - - - -  Follow up Falls prevention discussed - - - -    Is the patient's home free of loose throw rugs in walkways, pet beds, electrical cords, etc?   yes      Grab bars in the bathroom? no      Handrails on the stairs?   yes      Adequate lighting?   yes   Cognitive Function: MMSE - Mini Mental State Exam 09/15/2018  Orientation to time 5  Orientation to Place 5  Registration 3  Attention/ Calculation 5  Recall 3  Language- name 2 objects 2  Language- repeat 1  Language- follow 3 step command 3  Language- read &  follow direction 1  Write a sentence 1  Copy design 1  Total score 30     6CIT Screen 09/15/2018  What Year? 0 points  What month? 0 points  What time? 0 points  Count back from 20 0 points  Months in reverse 0 points  Repeat phrase 0 points  Total Score 0    Screening Tests Health Maintenance  Topic Date Due  . COLONOSCOPY  09/22/2016  . OPHTHALMOLOGY  EXAM  10/23/2018  . HEMOGLOBIN A1C  12/01/2018  . FOOT EXAM  02/15/2019  . MAMMOGRAM  06/25/2019  . TETANUS/TDAP  05/21/2025  . INFLUENZA VACCINE  Completed  . DEXA SCAN  Completed  . Hepatitis C Screening  Completed  . PNA vac Low Risk Adult  Completed    Cancer Screenings: Lung: Low Dose CT Chest recommended if Age 53-80 years, 30 pack-year currently smoking OR have quit w/in 15years. Patient does not qualify. Breast: Up to date on Mammogram? Yes   Up to date of Bone Density/Dexa? Yes Colorectal: referral entered  Additional Screenings:  Hepatitis C Screening: complete     Plan:  Patient in for AWV. Referral entered for colonoscopy, patient aware she is overdue. All other health maintenance items up to date.  I have personally reviewed and noted the following in the patient's chart:   . Medical and social history . Use of alcohol, tobacco or illicit drugs  . Current medications and supplements . Functional ability and status . Nutritional status . Physical activity . Advanced directives . List of other physicians . Hospitalizations, surgeries, and ER visits in previous 12 months . Vitals . Screenings to include cognitive, depression, and falls . Referrals and appointments  In addition, I have reviewed and discussed with patient certain preventive protocols, quality metrics, and best practice recommendations. A written personalized care plan for preventive services as well as general preventive health recommendations were provided to patient.     Nigel MormonAlisa S , RN   09/15/2018

## 2018-09-16 ENCOUNTER — Ambulatory Visit
Admission: RE | Admit: 2018-09-16 | Discharge: 2018-09-16 | Disposition: A | Discharge status: Home / Self Care | Payer: Medicare Other | Source: Ambulatory Visit | Attending: Family Medicine | Admitting: Family Medicine

## 2018-09-16 ENCOUNTER — Ambulatory Visit (INDEPENDENT_AMBULATORY_CARE_PROVIDER_SITE_OTHER): Payer: Medicare Other

## 2018-09-16 DIAGNOSIS — Z1231 Encounter for screening mammogram for malignant neoplasm of breast: Secondary | ICD-10-CM

## 2018-09-16 DIAGNOSIS — R002 Palpitations: Secondary | ICD-10-CM

## 2018-09-16 LAB — CUP PACEART REMOTE DEVICE CHECK
Date Time Interrogation Session: 20200131124008
MDC IDC PG IMPLANT DT: 20191126

## 2018-09-22 ENCOUNTER — Other Ambulatory Visit: Payer: Self-pay | Admitting: Student in an Organized Health Care Education/Training Program

## 2018-09-22 DIAGNOSIS — R110 Nausea: Secondary | ICD-10-CM

## 2018-09-23 NOTE — Progress Notes (Signed)
Carelink Summary Report / Loop Recorder 

## 2018-10-04 ENCOUNTER — Other Ambulatory Visit: Payer: Self-pay | Admitting: Internal Medicine

## 2018-10-05 ENCOUNTER — Other Ambulatory Visit: Payer: Self-pay

## 2018-10-05 ENCOUNTER — Encounter (HOSPITAL_COMMUNITY): Payer: Self-pay | Admitting: Emergency Medicine

## 2018-10-05 ENCOUNTER — Emergency Department (HOSPITAL_COMMUNITY): Payer: Medicare Other

## 2018-10-05 ENCOUNTER — Emergency Department (HOSPITAL_COMMUNITY)
Admission: EM | Admit: 2018-10-05 | Discharge: 2018-10-05 | Disposition: A | Discharge status: Home / Self Care | Payer: Medicare Other | Attending: Emergency Medicine | Admitting: Emergency Medicine

## 2018-10-05 DIAGNOSIS — N183 Chronic kidney disease, stage 3 (moderate): Secondary | ICD-10-CM | POA: Diagnosis not present

## 2018-10-05 DIAGNOSIS — R51 Headache: Secondary | ICD-10-CM | POA: Diagnosis present

## 2018-10-05 DIAGNOSIS — E119 Type 2 diabetes mellitus without complications: Secondary | ICD-10-CM | POA: Insufficient documentation

## 2018-10-05 DIAGNOSIS — R531 Weakness: Secondary | ICD-10-CM | POA: Diagnosis not present

## 2018-10-05 DIAGNOSIS — G4459 Other complicated headache syndrome: Secondary | ICD-10-CM | POA: Diagnosis not present

## 2018-10-05 DIAGNOSIS — Z79899 Other long term (current) drug therapy: Secondary | ICD-10-CM | POA: Insufficient documentation

## 2018-10-05 DIAGNOSIS — I129 Hypertensive chronic kidney disease with stage 1 through stage 4 chronic kidney disease, or unspecified chronic kidney disease: Secondary | ICD-10-CM | POA: Insufficient documentation

## 2018-10-05 LAB — COMPREHENSIVE METABOLIC PANEL
ALT: 21 U/L (ref 0–44)
AST: 28 U/L (ref 15–41)
Albumin: 3.8 g/dL (ref 3.5–5.0)
Alkaline Phosphatase: 74 U/L (ref 38–126)
Anion gap: 11 (ref 5–15)
BUN: 15 mg/dL (ref 8–23)
CO2: 22 mmol/L (ref 22–32)
Calcium: 9 mg/dL (ref 8.9–10.3)
Chloride: 105 mmol/L (ref 98–111)
Creatinine, Ser: 1.45 mg/dL — ABNORMAL HIGH (ref 0.44–1.00)
GFR calc Af Amer: 43 mL/min — ABNORMAL LOW (ref >60)
GFR calc non Af Amer: 37 mL/min — ABNORMAL LOW (ref >60)
Glucose, Bld: 103 mg/dL — ABNORMAL HIGH (ref 70–99)
POTASSIUM: 4.3 mmol/L (ref 3.5–5.1)
Sodium: 138 mmol/L (ref 135–145)
TOTAL PROTEIN: 7.2 g/dL (ref 6.5–8.1)
Total Bilirubin: 0.8 mg/dL (ref 0.3–1.2)

## 2018-10-05 LAB — DIFFERENTIAL
Abs Immature Granulocytes: 0.01 10*3/uL (ref 0.00–0.07)
Basophils Absolute: 0 10*3/uL (ref 0.0–0.1)
Basophils Relative: 0 %
Eosinophils Absolute: 0.1 10*3/uL (ref 0.0–0.5)
Eosinophils Relative: 2 %
Immature Granulocytes: 0 %
Lymphocytes Relative: 45 %
Lymphs Abs: 2.1 10*3/uL (ref 0.7–4.0)
Monocytes Absolute: 0.5 10*3/uL (ref 0.1–1.0)
Monocytes Relative: 11 %
Neutro Abs: 2 10*3/uL (ref 1.7–7.7)
Neutrophils Relative %: 42 %

## 2018-10-05 LAB — CBC
HCT: 36.9 % (ref 36.0–46.0)
Hemoglobin: 11.7 g/dL — ABNORMAL LOW (ref 12.0–15.0)
MCH: 29 pg (ref 26.0–34.0)
MCHC: 31.7 g/dL (ref 30.0–36.0)
MCV: 91.6 fL (ref 80.0–100.0)
Platelets: 178 10*3/uL (ref 150–400)
RBC: 4.03 MIL/uL (ref 3.87–5.11)
RDW: 13.7 % (ref 11.5–15.5)
WBC: 4.7 10*3/uL (ref 4.0–10.5)
nRBC: 0 % (ref 0.0–0.2)

## 2018-10-05 LAB — PROTIME-INR
INR: 1.53
Prothrombin Time: 18.3 seconds — ABNORMAL HIGH (ref 11.4–15.2)

## 2018-10-05 LAB — ETHANOL: Alcohol, Ethyl (B): <10 mg/dL (ref <10)

## 2018-10-05 LAB — APTT: aPTT: 34 seconds (ref 24–36)

## 2018-10-05 MED ORDER — ACETAMINOPHEN 500 MG PO TABS
500.0000 mg | ORAL_TABLET | Freq: Once | ORAL | Status: AC
  Administered 2018-10-05: 500 mg via ORAL
  Filled 2018-10-05: qty 1

## 2018-10-05 MED ORDER — DIPHENHYDRAMINE HCL 25 MG PO CAPS
25.0000 mg | ORAL_CAPSULE | Freq: Once | ORAL | Status: AC
  Administered 2018-10-05: 25 mg via ORAL
  Filled 2018-10-05: qty 1

## 2018-10-05 MED ORDER — METOCLOPRAMIDE HCL 5 MG/ML IJ SOLN
10.0000 mg | Freq: Once | INTRAMUSCULAR | Status: AC
  Administered 2018-10-05: 10 mg via INTRAVENOUS
  Filled 2018-10-05: qty 2

## 2018-10-05 MED ORDER — LORAZEPAM 2 MG/ML IJ SOLN
0.5000 mg | Freq: Once | INTRAMUSCULAR | Status: AC
  Administered 2018-10-05: 0.5 mg via INTRAVENOUS
  Filled 2018-10-05: qty 1

## 2018-10-05 NOTE — Discharge Instructions (Signed)
Please follow up with a neurologist for your headaches and the tingling in your arm.

## 2018-10-05 NOTE — ED Triage Notes (Addendum)
BIB [facility] from home with c/o of blurred vision and dizziness starting @ 0530 this am. Pt went to bed around 2300 with no complaints. Pt also reports intermittent R Arm numbness. CGB 105. Hx of diabetes. Currently taking Xerelto for Afib.

## 2018-10-05 NOTE — ED Notes (Signed)
PIV removed from left hand-area intact-dressing clean.   Pt attempting to call family for discharge.

## 2018-10-05 NOTE — ED Provider Notes (Signed)
[facility] EMERGENCY DEPARTMENT Provider Note   CSN: [id number] Arrival date & time: 10/05/18  4098    History   Chief Complaint Chief Complaint  Patient presents with  . Stroke Symptoms    HPI [name] is a 68 y.o. female with PMH significant for Afib on xarelto, uncontrolled T2DM, HTN, CKD, HLD, chronic back pain     She states that her last known normal was last night before she went to bed. She woke up this morning with a headache and vision changes. She states that had "big black spot" in her vision and blurriness in both eyes upon waking. Her headache is bilateral frontal and also some light sensitivity. She also has R arm numbness new this morning. She has chronic tingling in both hands every morning that generally improves throughout the day.  Denies double vision, slurred speech, chest pain, weakness.        Past Medical History:  Diagnosis Date  . Anxiety   . Arthritis    "knees, hands, ankles, feet" (05/12/2016)  . Asthma    Dr. [name]  . Atrial fibrillation (HCC)   . Bipolar disorder (HCC)   . Chronic back pain    "lower and middle" (05/12/2016)  . Chronic kidney disease (CKD), stage III (moderate) (HCC) 05/14/2015  . CKD (chronic kidney disease), stage III (HCC)   . Colon polyps   . Depression   . Diverticulosis   . Epilepsy (HCC)   . Essential hypertension   . Fibromyalgia   . Gallstones   . GERD (gastroesophageal reflux disease)   . H/O hiatal hernia   . Hyperlipidemia   . IBS (irritable bowel syndrome)   . Insulin dependent diabetes mellitus (HCC)   . Paroxysmal A-fib (HCC)    failed medical therapy with tikosyn, s/p AF ablation x 2  . Paroxysmal A-fib (HCC)    a. On [name]. b. Recurrence 08/2014 in setting of GI illness.  . Paroxysmal atrial fibrillation (HCC) 05/18/2017  . Peripheral neuropathy   . Persistent atrial fibrillation   . Sarcoidosis of lung (HCC)    Dr. [name]  . Seizures (HCC)    "epileptic; pretty regular  recently" (05/12/2016)  . Syncope   . Type II diabetes mellitus (HCC)   . Type II diabetes mellitus [facility])     Patient Active Problem List   Diagnosis Date Noted  . Bruise 06/16/2018  . Nonepileptic episode (HCC) 06/08/2018  . Acute pericarditis 06/02/2018  . Atypical chest pain 06/01/2018  . Seasonal allergies 04/22/2018  . Nausea without vomiting 01/26/2018  . Cough due to ACE inhibitor 12/21/2017  . Gait abnormality 11/30/2017  . Cardiac syncope 11/22/2017  . Bipolar disorder (HCC)   . Chronic back pain   . CKD (chronic kidney disease), stage III (HCC)   . Depression   . Anxiety   . Estrogen deficiency 06/07/2017  . Wellness examination 06/03/2017  . A-fib (HCC) 05/05/2017  . Back pain 12/11/2016  . Pelvic pain 11/25/2016  . Chronic pain 09/08/2016  . Spells of decreased attentiveness 09/08/2016  . Irritable bowel syndrome 03/03/2016  . Chronic leukopenia 09/13/2015  . PAF (paroxysmal atrial fibrillation) (HCC) 08/22/2015  . Morbid obesity (HCC) 07/11/2015  . Bipolar I disorder (HCC) 05/22/2015  . History of diverticulitis   . Syncope 06/29/2012  . Orthostatic hypotension 06/29/2012  . History of colonic polyps 09/01/2011  . Sarcoid (HCC) 09/01/2011  . Asthma 09/01/2011  . GERD (gastroesophageal reflux disease) 09/01/2011  . Diabetes mellitus  type 2, uncontrolled, with complications (HCC) 11/19/2008  . Hyperlipidemia 11/19/2008  . Essential hypertension 11/19/2008    Past Surgical History:  Procedure Laterality Date  . ABDOMINAL HYSTERECTOMY  1995  . ANTERIOR CERVICAL DECOMP/DISCECTOMY FUSION  07/11/2012   Procedure: ANTERIOR CERVICAL DECOMPRESSION/DISCECTOMY FUSION 1 LEVEL;  Surgeon: [name], MD;  Location: [facility] NEURO ORS;  Service: Neurosurgery;  Laterality: N/A;  Cervical Five-Six Anterior Cervical Decompression with Fusion Interbody Prothesis Plating and Bonegraft  . ATRIAL FIBRILLATION ABLATION N/A 05/18/2017   Procedure: Atrial Fibrillation Ablation;   Surgeon: [name], MD;  Location: [facility] INVASIVE CV LAB;  Service: Cardiovascular;  Laterality: N/A;  . ATRIAL FIBRILLATION ABLATION N/A 11/16/2017   Procedure: ATRIAL FIBRILLATION ABLATION;  Surgeon: [name], MD;  Location: [facility] INVASIVE CV LAB;  Service: Cardiovascular;  Laterality: N/A;  . CARDIAC CATHETERIZATION  2012   a. Normal coronaries 2012. b. Normal nuc 09/2014.  Marland Kitchen CARDIAC CATHETERIZATION  05/18/2017  . CHOLECYSTECTOMY OPEN  1976  . CLOSED REDUCTION ANKLE FRACTURE Left 10/2006   "got steel rod in my leg; and screws"  . COLON SURGERY    . DILATION AND CURETTAGE OF UTERUS    . ESOPHAGEAL MANOMETRY  03/21/2012   Procedure: ESOPHAGEAL MANOMETRY (EM);  Surgeon: [name], MD;  Location: [facility] ENDOSCOPY;  Service: Endoscopy;  Laterality: N/A;  . FRACTURE SURGERY    . LOOP RECORDER INSERTION N/A 07/12/2018   Procedure: LOOP RECORDER INSERTION;  Surgeon: [name], MD;  Location: [facility] INVASIVE CV LAB;  Service: Cardiovascular;  Laterality: N/A;  . NEUROPLASTY / TRANSPOSITION MEDIAN NERVE AT CARPAL TUNNEL Left 2004  . NEUROPLASTY / TRANSPOSITION MEDIAN NERVE AT CARPAL TUNNEL Right 2002  . 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 SALPINGOOPHORECTOMY Bilateral 2000  . TEE WITHOUT CARDIOVERSION N/A 05/18/2017   Procedure: TRANSESOPHAGEAL ECHOCARDIOGRAM (TEE);  Surgeon: [name], MD;  Location: [facility] ENDOSCOPY;  Service: Cardiovascular;  Laterality: N/A;  . TUBAL LIGATION  1982     OB History   No obstetric history on file.      Home Medications    Prior to Admission medications   Medication Sig Start Date End Date Taking? Authorizing Provider  ACCU-CHEK GUIDE test strip AS DIRECTED QID IN VITRO 09/05/18   [provider]  albuterol (PROAIR HFA) 108 (90 Base) MCG/ACT inhaler inhale 2 puffs every 6 hours if needed for wheezing or shortness of breath Patient taking differently: Inhale 2 puffs into the lungs every 6 (six) hours as needed for wheezing or shortness of  breath.  08/30/17   Mikell, Antionette Poles, MD  albuterol (PROVENTIL) (2.5 MG/3ML) 0.083% nebulizer solution Take 3 mLs (2.5 mg total) by nebulization every 4 (four) hours as needed for wheezing or shortness of breath (dx: J45.31). 12/07/17   Parrett, Virgel Bouquet, NP  amLODipine (NORVASC) 10 MG tablet Take 1 tablet (10 mg total) by mouth daily. 03/30/18   Anderson, Chelsey L, DO  atorvastatin (LIPITOR) 80 MG tablet Take 80 mg by mouth at bedtime. 05/29/16   [provider]  BD PEN NEEDLE NANO U/F 32G X 4 MM MISC USE TO ADMINISTER INSULIN 4 TIMES DAILY 09/13/18   [provider]  calcium carbonate (OS-CAL) 600 MG TABS tablet Take 600 mg by mouth daily.    [provider]  cholecalciferol (VITAMIN D) 1000 UNITS tablet Take 1,000 Units by mouth 2 (two) times daily.     [provider]  Cranberry 500 MG CAPS Take 500 mg by mouth 2 (  two) times daily.    [provider]  diclofenac sodium (VOLTAREN) 1 % GEL APPLY 2 INCHES TO AFFECTED AREA THREE TIMES A DAY Patient taking differently: Apply 1 application topically 4 (four) times daily.  06/03/18   Meccariello, Solmon Ice, DO  diphenoxylate-atropine (LOMOTIL) 2.5-0.025 MG tablet TAKE 1 TABLET BY MOUTH TWICE DAILY AS NEEDED FOR DIARRHEA OR LOOSE STOOLS Patient taking differently: Take 1 tablet by mouth 2 (two) times daily as needed for diarrhea or loose stools.  07/04/18   Meredith Pel, NP  DULERA 200-5 MCG/ACT AERO INHALE 2 PUFFS INTO THE LUNGS TWICE DAILY 08/26/18   Mannam, Colbert Coyer, MD  DULoxetine (CYMBALTA) 60 MG capsule Take 60 mg by mouth at bedtime.     [provider]  esomeprazole (NEXIUM) 40 MG capsule TAKE 1 CAPSULE(40 MG) BY MOUTH TWICE DAILY 30 MINUTES BEFORE BREAKFAST AND DINNER 09/26/18   Anderson, Chelsey L, DO  EVZIO 0.4 MG/0.4ML SOAJ Inject 0.4 mLs as directed daily as needed (for overdose).  07/10/15   [provider]  fluticasone (FLONASE) 50 MCG/ACT nasal spray instill 2 sprays into  each nostril once daily Patient taking differently: Place 2 sprays into both nostrils 2 (two) times daily.  04/21/18   Wendee Beavers, DO  HUMALOG KWIKPEN 100 UNIT/ML KiwkPen Inject 18-32 Units into the skin 2 (two) times daily. PER Patient home SLIDING SCALE 18 units if CBG < 200 32 units  If CBG  > 200 05/04/17   [provider]  Insulin Glargine (LANTUS SOLOSTAR) 100 UNIT/ML Solostar Pen Inject 40 Units into the skin at bedtime. Patient taking differently: Inject 50 Units into the skin at bedtime.  11/25/17   Garnette Gunner, MD  irbesartan (AVAPRO) 75 MG tablet Take 75 mg by mouth daily.  12/23/17   [provider]  loratadine (CLARITIN) 10 MG tablet TAKE 1 TABLET BY MOUTH ONCE DAILY Patient taking differently: Take 10 mg by mouth daily.  03/01/18   Anderson, Chelsey L, DO  magnesium oxide (MAG-OX) 400 MG tablet TAKE 1 TABLET BY MOUTH EVERY DAY Patient taking differently: Take 400 mg by mouth daily.  03/01/18   Wendall Stade, MD  mometasone-formoterol (DULERA) 100-5 MCG/ACT AERO Inhale 2 puffs into the lungs 2 (two) times daily.    [provider]  nitroGLYCERIN (NITROSTAT) 0.4 MG SL tablet PLACE 1 TABLET UNDER THE TONGUE IF NEEDED EVERY 5 MINUTES AS NEEDED FOR CHEST PAIN; DO NOT EXCEED 3 DOSES Patient taking differently: Place 0.4 mg under the tongue every 5 (five) minutes as needed for chest pain.  04/08/18   Allred, Fayrene Fearing, MD  ondansetron (ZOFRAN) 4 MG tablet Take 1 tablet (4 mg total) by mouth every 8 (eight) hours as needed for nausea or vomiting. 10/08/17   Mikell, Antionette Poles, MD  oxyCODONE-acetaminophen (PERCOCET) 10-325 MG tablet Take 1 tablet by mouth 5 (five) times daily.     [provider]  Polyvinyl Alcohol-Povidone PF (REFRESH) 1.4-0.6 % SOLN Place 1 drop into both eyes 3 (three) times daily.     [provider]  potassium chloride (K-DUR) 10 MEQ tablet take 1 tablet by mouth once daily Patient taking differently: Take 10 mEq by mouth  daily.  05/04/17   Wendall Stade, MD  pregabalin (LYRICA) 100 MG capsule Take 100 mg by mouth 3 (three) times daily.    [provider]  rivaroxaban (XARELTO) 20 MG TABS tablet Take 20 mg by mouth once a day with supper 07/21/18  [name], MD  tamsulosin (FLOMAX) 0.4 MG CAPS capsule Take 0.4 mg by mouth at bedtime. 04/16/17   [provider]  vitamin C (ASCORBIC ACID) 500 MG tablet Take 500 mg by mouth 2 (two) times daily.    [provider]    Family History Family History  Problem Relation Age of Onset  . Heart attack Mother        @ age 8  . Mental illness Mother   . Diabetes Mother   . Hypertension Mother        siblings  . Alzheimer's disease Mother   . Depression Mother   . Hyperlipidemia Mother   . Heart attack Brother 50  . Alcohol abuse Brother   . Depression Brother   . Diabetes Brother   . Hyperlipidemia Brother   . Hypertension Brother   . Kidney disease Brother   . Drug abuse Brother   . Alcohol abuse Father   . Heart attack Father   . Hyperlipidemia Father   . Hypertension Father   . Colon cancer Maternal Aunt   . Prostate cancer Maternal Grandfather   . Diabetes Maternal Grandfather   . Hyperlipidemia Maternal Grandfather   . Ovarian cancer Maternal Aunt   . Diabetes Other   . Hypertension Other   . Lupus Sister   . Alcohol abuse Sister   . Depression Sister   . Diabetes Sister   . Hyperlipidemia Sister   . Hypertension Sister   . Kidney disease Sister   . Drug abuse Sister   . Ovarian cancer Cousin   . Diabetes Maternal Grandmother   . Hyperlipidemia Maternal Grandmother   . Breast cancer Neg Hx     Social History Social History   Tobacco Use  . Smoking status: Never Smoker  . Smokeless tobacco: Never Used  Substance Use Topics  . Alcohol use: No  . Drug use: No     Allergies   Prednisone; Amitriptyline; Hydromorphone hcl; and Lisinopril   Review of Systems Review of Systems  Constitutional:  Negative for chills and fever.  Eyes: Positive for photophobia and visual disturbance.  Respiratory: Negative for chest tightness and shortness of breath.   Cardiovascular: Negative for chest pain and palpitations.  Gastrointestinal: Negative for abdominal pain, nausea and vomiting.  Musculoskeletal: Positive for back pain.  Neurological: Positive for headaches. Negative for dizziness, speech difficulty, weakness and light-headedness.     Physical Exam Updated Vital Signs BP 122/71   Pulse 72   Temp 97.8 F (36.6 C) (Oral)   Resp 13   Ht 5\' 6"  (1.676 m)   Wt 99.8 kg   SpO2 96%   BMI 35.51 kg/m   Physical Exam Constitutional:      General: She is not in acute distress.    Appearance: Normal appearance. She is obese. She is not ill-appearing, toxic-appearing or diaphoretic.  HENT:     Head: Normocephalic and atraumatic.     Nose: Nose normal.     Mouth/Throat:     Mouth: Mucous membranes are moist.     Pharynx: Oropharynx is clear. No oropharyngeal exudate or posterior oropharyngeal erythema.  Eyes:     General: Visual field deficit (bilaterally decreased peripheral fields) present.     Extraocular Movements: Extraocular movements intact.     Pupils: Pupils are equal, round, and reactive to light.  Neck:     Musculoskeletal: Normal range of motion.  Cardiovascular:     Rate and Rhythm: Normal rate and regular rhythm.  Heart sounds: No murmur.  Pulmonary:     Effort: Pulmonary effort is normal.     Breath sounds: Normal breath sounds.  Abdominal:     General: Abdomen is flat. Bowel sounds are normal.     Tenderness: There is no abdominal tenderness.  Musculoskeletal: Normal range of motion.     Right lower leg: No edema.     Left lower leg: No edema.  Skin:    General: Skin is warm and dry.     Capillary Refill: Capillary refill takes less than 2 seconds.  Neurological:     Mental Status: She is alert and oriented to person, place, and time.     Cranial Nerves:  No dysarthria or facial asymmetry.     Sensory: Sensory deficit (R sided decreased sensation throughout) present.     Motor: No weakness, abnormal muscle tone or pronator drift.     Coordination: Coordination is intact. Coordination normal. Finger-Nose-Finger Test normal.      ED Treatments / Results  Labs (all labs ordered are listed, but only abnormal results are displayed) Labs Reviewed  PROTIME-INR - Abnormal; Notable for the following components:      Result Value   Prothrombin Time 18.3 (*)    All other components within normal limits  CBC - Abnormal; Notable for the following components:   Hemoglobin 11.7 (*)    All other components within normal limits  ETHANOL  APTT  DIFFERENTIAL  RAPID URINE DRUG SCREEN, HOSP PERFORMED  URINALYSIS, ROUTINE W REFLEX MICROSCOPIC    EKG EKG Interpretation  Date/Time:  Wednesday October 05 2018 06:46:54 EST Ventricular Rate:  68 PR Interval:    QRS Duration: 81 QT Interval:  370 QTC Calculation: 394 R Axis:   -13 Text Interpretation:  Sinus rhythm Atrial premature complexes Low voltage, precordial leads Borderline T wave abnormalities No significant change since last tracing Confirmed by Melene Plan [phone]) on 10/05/2018 7:05:39 AM   Radiology Ct Head Wo Contrast  Result Date: 10/05/2018 CLINICAL DATA:  Blurry vision and dizziness starting at 5:30 this morning. Intermittent right upper extremity numbness. EXAM: CT HEAD WITHOUT CONTRAST TECHNIQUE: Contiguous axial images were obtained from the base of the skull through the vertex without intravenous contrast. COMPARISON:  11/22/2017; brain MRI-11/25/2017 FINDINGS: Brain: Gray-white differentiation is maintained. No CT evidence of acute large territory infarct. No intraparenchymal or extra-axial mass or hemorrhage. Normal size and configuration of the ventricles and the basilar cisterns. No midline shift. Vascular: Minimal amount of intracranial atherosclerosis. Skull: No displaced  calvarial fracture. Sinuses/Orbits: Limited visualization of the paranasal sinuses and mastoid air cells is normal. No air-fluid levels. Other: Regional soft tissues appear normal. IMPRESSION: Negative noncontrast head CT. Electronically Signed   By: Simonne Come M.D.   On: 10/05/2018 08:04    Procedures Procedures (including critical care time)  Medications Ordered in ED Medications  diphenhydrAMINE (BENADRYL) capsule 25 mg (25 mg Oral Given 10/05/18 0835)  acetaminophen (TYLENOL) tablet 500 mg (500 mg Oral Given 10/05/18 0835)  metoCLOPramide (REGLAN) injection 10 mg (10 mg Intravenous Given 10/05/18 0834)     Initial Impression / Assessment and Plan / ED Course  I have reviewed the triage vital signs and the nursing notes.  Pertinent labs & imaging results that were available during my care of the patient were reviewed by me and considered in my medical decision making (see chart for details).      Patient presented with vision changes and R arm numbness and tingling upon waking  this morning with a headache. On exam has decreased R side sensation that is unchanged with improvement of her headache. She does have decreased peripheral visual fields bilaterally as well but no other neuro deficits on exam. CT head negative. Discussed with Dr. Anise SalvoKirpatrick who recommended proceeding with MRI brain.   MRI brain negative. Likely a complex migraine. Recommended that patient should follow up with PCP and perhaps neurology as outpatient. She voiced good understanding.  Final Clinical Impressions(s) / ED Diagnoses   Final diagnoses:  Other complicated headache syndrome  Right sided weakness    ED Discharge Orders    None       Leland HerYoo,  J, DO 10/05/18 1121    Melene PlanFloyd, Dan, DO 10/05/18 1230

## 2018-10-05 NOTE — ED Notes (Signed)
Patient transported to MRI 

## 2018-10-07 ENCOUNTER — Telehealth: Payer: Self-pay | Admitting: Diagnostic Neuroimaging

## 2018-10-07 ENCOUNTER — Encounter: Payer: Self-pay | Admitting: Neurology

## 2018-10-07 NOTE — Telephone Encounter (Signed)
Pt called in to update [place] about recent ER visit. Was dx'd with complicated migraine (numbness + HA; neg MRI brain). Still having  some confusion, stuttering, numbness.   Recommend to follow up with PCP for general check up, and if needed we can also see patient in future.    Suanne Marker, MD 10/07/2018, 2:34 PM Certified in Neurology, Neurophysiology and Neuroimaging  [facility] [address] [place], [place] [address] [phone]

## 2018-10-10 ENCOUNTER — Telehealth: Payer: Self-pay

## 2018-10-10 ENCOUNTER — Ambulatory Visit (INDEPENDENT_AMBULATORY_CARE_PROVIDER_SITE_OTHER): Payer: Medicare Other | Admitting: Family Medicine

## 2018-10-10 ENCOUNTER — Other Ambulatory Visit: Payer: Self-pay

## 2018-10-10 ENCOUNTER — Encounter: Payer: Self-pay | Admitting: Family Medicine

## 2018-10-10 VITALS — BP 122/70 | HR 80 | Temp 98.5°F | Ht 66.0 in | Wt 221.0 lb

## 2018-10-10 DIAGNOSIS — G43919 Migraine, unspecified, intractable, without status migrainosus: Secondary | ICD-10-CM

## 2018-10-10 MED ORDER — PROMETHAZINE HCL 25 MG/ML IJ SOLN
12.5000 mg | Freq: Once | INTRAMUSCULAR | Status: AC
  Administered 2018-10-10: 12.5 mg via INTRAMUSCULAR

## 2018-10-10 NOTE — Patient Instructions (Signed)
Please call the neurologist for an appointment  Giving you a shot of medication today to help calm the headache Checking a blood test  Schedule follow up appointment with Dr. Dareen Piano as well.  Be well, Dr. Pollie Meyer

## 2018-10-10 NOTE — Telephone Encounter (Signed)
I left a message on pt voicemail to send a manual transmission with her home monitor.  

## 2018-10-10 NOTE — Progress Notes (Signed)
Date of Visit: 10/10/2018   HPI:  Patient presents for ER follow up appointment. Seen on 2/19 in the ED with headache and vision changes. Underwent CT head and MRI which were both unremarkable. Was instructed to follow up with her neurologist by the ED team, however when she called neuro office they asked her to be seen here first.  Patient reports a history of migraines in the past, for which she previously took maxalt. Notes that she's still having symptoms including nausea/vomiting, headache, numbness and tingling around her lips, numbness in her hands and also numbness and tingling down her R leg. Headache is still located on the frontal area and in her temples. No pain with chewing.  In ED got benadryl and reglan which helped her symptoms. Would like a shot of something today. She does not drive.   ROS: See HPI.  PMFSH: history of diabetes, hyperlipidemia, hypertension, sarcoid, asthma, GERD, bipolar I, morbid obesity, PAF, IBS, CKD3, depression, anxiety, chronic back pain (on chronic percocet)  PHYSICAL EXAM: BP 122/70   Pulse 80   Temp 98.5 F (36.9 C) (Oral)   Ht 5\' 6"  (1.676 m)   Wt 221 lb (100.2 kg)   SpO2 97%   BMI 35.67 kg/m  Gen: no acute distress, appears as though does not feel well HEENT: normocephalic, atraumatic, moist mucous membranes. Full ROM of neck. Heart: regular rate and rhythm, no murmur Lungs: clear to auscultation bilaterally, normal work of breathing  Neuro: full strength bilateral upper & lower extremities. Face symmetric, tongue protrudes midline. pupils equal round and reactive to light. Normal FNF bilaterally. Speech normal. Shoulder shrug 5/5 bilaterally. Sensation intact to light touch over bilateral face, arms and legs.  ASSESSMENT/PLAN:  Migraine Diagnosed with complex migraine in the ED, with normal MRI head. Patient is followed by neurology for syncope and likely nonepileptic events. She has a complex PMhx and is already on many medications.  Options for treating migraine are limited in her. Will provide IM phenergan today, refill zofran, and have patient follow up with neurology for further assessment. While I doubt this is the case, I will also obtain a sed rate to rule out temporal arteritis as the cause of her pain.   Also advised patient to schedule follow up appointment with PCP for her many medical problems.   [place] J. Pollie Meyer, MD [facility]

## 2018-10-11 ENCOUNTER — Telehealth: Payer: Self-pay

## 2018-10-11 LAB — SEDIMENTATION RATE: Sed Rate: 26 mm/hr (ref 0–40)

## 2018-10-11 MED ORDER — ONDANSETRON HCL 4 MG PO TABS: 4.0000 mg | ORAL_TABLET | Freq: Three times a day (TID) | ORAL | 0 refills | Status: DC | PRN

## 2018-10-11 NOTE — Telephone Encounter (Signed)
Patient called. Saw Dr Pollie Meyer yesterday. Zofran was supposed to be sent to pharmacy (OV note supports this) but was not sent.  Please send.  Ples Specter, RN [facility] RN)

## 2018-10-11 NOTE — Telephone Encounter (Signed)
Prescription sent to [facility].   Terisa Starr, MD  Family Medicine Teaching Service

## 2018-10-11 NOTE — Telephone Encounter (Signed)
AF transmissions false

## 2018-10-12 ENCOUNTER — Ambulatory Visit (INDEPENDENT_AMBULATORY_CARE_PROVIDER_SITE_OTHER): Payer: Medicare Other | Admitting: Gastroenterology

## 2018-10-12 ENCOUNTER — Encounter: Payer: Self-pay | Admitting: Gastroenterology

## 2018-10-12 VITALS — BP 158/90 | HR 76 | Ht 66.0 in | Wt 227.0 lb

## 2018-10-12 DIAGNOSIS — G8929 Other chronic pain: Secondary | ICD-10-CM | POA: Diagnosis not present

## 2018-10-12 DIAGNOSIS — R1031 Right lower quadrant pain: Secondary | ICD-10-CM | POA: Diagnosis not present

## 2018-10-12 NOTE — Progress Notes (Signed)
Review of pertinent gastrointestinal problems:  1. chronic GERD. EGD February 2013 showed mild gastritis, H. pylori negative on biopsy. On proton pump inhibitor once daily and nightly Zantac  2. history of adenomatous polyps: Colonoscopy February 2013 found diverticulosis but no polyps.  Colonoscopy, October 2007 by Dr. Victorino Dike done for "adenomatous polyps" was normal. 3. hoarseness, coughing, intermittent shortness of breath; not clear if this is related to her GERD but seems unlikely (5/13); was referred to ear nose and throat physician who ordered MBSS 2013: Clinical impression: Pt demonstrated appearance of a primary esophageal dysphagia. Oral and oropharyngeal function within normal limits with no aspiration or penetration. Esophageal sweep showed appearance of slow emptying of thin liquids though GE juntction. After about 4 oz consumption of liquid barium. Pt began coughing and gagging, expectorating barium tinged secretions. Pt attempted to consume puree and could not due to hard coughing and gagging. Pt would benefit from further assessment by GI. 02/2012 Esophageal manometry was completely normal.   HPI: This is a 68 year old woman whom I last saw 2 years ago here in the office for right lower quadrant, right groin pain.  At that time I did not feel her pains were gastrointestinal, I ordered a CT scan abdomen and pelvis it was unrevealing.  Today she is here in the office with the lights turned out because of a severe terrible migraine she is experiencing for the past several days.  She tells me she was at the pain clinic yesterday for her severe back pains.  She takes oxycodone for that several times a day I believe.  She again has right lower quadrant pain.  At first she said it was just for the past week then she said it was for several weeks.  Looking back I think this is been going on for quite a while and it was the same reason that she came in 2 years ago.  The pain is in her low  right groin.  She thinks something was removed from this area by what sounds like a gynecologic surgeon 20 years ago.  I cannot find any record of that in epic.  She tells me the pain goes down her right leg and it is unbearable.  Indeed she is moaning a lot, moving about with obvious back pain, her eyes are closed and she is reaching at her head with headache symptoms.   Chief complaint is right lower quadrant, right groin pain  ROS: complete GI ROS as described in HPI, all other review negative.  Constitutional:  No unintentional weight loss; her weight is up 15 pounds in 1 year, same scale here in the GI office.   Past Medical History:  Diagnosis Date  . Anxiety   . Arthritis    "knees, hands, ankles, feet" (05/12/2016)  . Asthma    Dr. [name]  . Atrial fibrillation (HCC)   . Bipolar disorder (HCC)   . Chronic back pain    "lower and middle" (05/12/2016)  . Chronic kidney disease (CKD), stage III (moderate) (HCC) 05/14/2015  . CKD (chronic kidney disease), stage III (HCC)   . Colon polyps   . Depression   . Diverticulosis   . Epilepsy (HCC)   . Essential hypertension   . Fibromyalgia   . Gallstones   . GERD (gastroesophageal reflux disease)   . H/O hiatal hernia   . Hyperlipidemia   . IBS (irritable bowel syndrome)   . Insulin dependent diabetes mellitus (HCC)   . Paroxysmal A-fib (  HCC)    failed medical therapy with tikosyn, s/p AF ablation x 2  . Paroxysmal A-fib (HCC)    a. On [name]. b. Recurrence 08/2014 in setting of GI illness.  . Paroxysmal atrial fibrillation (HCC) 05/18/2017  . Peripheral neuropathy   . Persistent atrial fibrillation   . Sarcoidosis of lung (HCC)    Dr. [name]  . Seizures (HCC)    "epileptic; pretty regular recently" (05/12/2016)  . Syncope   . Type II diabetes mellitus (HCC)   . Type II diabetes mellitus (HCC)     Past Surgical History:  Procedure Laterality Date  . ABDOMINAL HYSTERECTOMY  1995  . ANTERIOR CERVICAL DECOMP/DISCECTOMY FUSION   07/11/2012   Procedure: ANTERIOR CERVICAL DECOMPRESSION/DISCECTOMY FUSION 1 LEVEL;  Surgeon: [name], MD;  Location: [facility];  Service: Neurosurgery;  Laterality: N/A;  Cervical Five-Six Anterior Cervical Decompression with Fusion Interbody Prothesis Plating and Bonegraft  . ATRIAL FIBRILLATION ABLATION N/A 05/18/2017   Procedure: Atrial Fibrillation Ablation;  Surgeon: [name], MD;  Location: [facility];  Service: Cardiovascular;  Laterality: N/A;  . ATRIAL FIBRILLATION ABLATION N/A 11/16/2017   Procedure: ATRIAL FIBRILLATION ABLATION;  Surgeon: [name], MD;  Location: [facility];  Service: Cardiovascular;  Laterality: N/A;  . CARDIAC CATHETERIZATION  2012   a. Normal coronaries 2012. b. Normal nuc 09/2014.  Marland Kitchen CARDIAC CATHETERIZATION  05/18/2017  . CHOLECYSTECTOMY OPEN  1976  . CLOSED REDUCTION ANKLE FRACTURE Left 10/2006   "got steel rod in my leg; and screws"  . COLON SURGERY    . DILATION AND CURETTAGE OF UTERUS    . ESOPHAGEAL MANOMETRY  03/21/2012   Procedure: ESOPHAGEAL MANOMETRY (EM);  Surgeon: [name], MD;  Location: [facility];  Service: Endoscopy;  Laterality: N/A;  . FRACTURE SURGERY    . LOOP RECORDER INSERTION N/A 07/12/2018   Procedure: LOOP RECORDER INSERTION;  Surgeon: [name], MD;  Location: [facility];  Service: Cardiovascular;  Laterality: N/A;  . NEUROPLASTY / TRANSPOSITION MEDIAN NERVE AT CARPAL TUNNEL Left 2004  . NEUROPLASTY / TRANSPOSITION MEDIAN NERVE AT CARPAL TUNNEL Right 2002  . 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 SALPINGOOPHORECTOMY Bilateral 2000  . TEE WITHOUT CARDIOVERSION N/A 05/18/2017   Procedure: TRANSESOPHAGEAL ECHOCARDIOGRAM (TEE);  Surgeon: [name], MD;  Location: [facility];  Service: Cardiovascular;  Laterality: N/A;  . TUBAL LIGATION  1982    Current Outpatient Medications  Medication Sig Dispense Refill  . ACCU-CHEK GUIDE test strip AS DIRECTED QID IN VITRO    .  albuterol (PROAIR HFA) 108 (90 Base) MCG/ACT inhaler inhale 2 puffs every 6 hours if needed for wheezing or shortness of breath (Patient taking differently: Inhale 2 puffs into the lungs every 6 (six) hours as needed for wheezing or shortness of breath. ) 8.5 g 1  . albuterol (PROVENTIL) (2.5 MG/3ML) 0.083% nebulizer solution Take 3 mLs (2.5 mg total) by nebulization every 4 (four) hours as needed for wheezing or shortness of breath (dx: J45.31). 75 mL 5  . amLODipine (NORVASC) 10 MG tablet Take 1 tablet (10 mg total) by mouth daily. 60 tablet 3  . atorvastatin (LIPITOR) 80 MG tablet Take 80 mg by mouth at bedtime.  0  . BD PEN NEEDLE NANO U/F 32G X 4 MM MISC USE TO ADMINISTER INSULIN 4 TIMES DAILY    . calcium carbonate (OS-CAL) 600 MG TABS tablet Take 600 mg by mouth daily.    . cholecalciferol (VITAMIN  D) 1000 UNITS tablet Take 1,000 Units by mouth 2 (two) times daily.     . Cranberry 500 MG CAPS Take 500 mg by mouth 2 (two) times daily.    . diclofenac sodium (VOLTAREN) 1 % GEL APPLY 2 INCHES TO AFFECTED AREA THREE TIMES A DAY (Patient taking differently: Apply 1 application topically 4 (four) times daily. ) 500 g 0  . diphenoxylate-atropine (LOMOTIL) 2.5-0.025 MG tablet TAKE 1 TABLET BY MOUTH TWICE DAILY AS NEEDED FOR DIARRHEA OR LOOSE STOOLS (Patient taking differently: Take 1 tablet by mouth 2 (two) times daily as needed for diarrhea or loose stools. ) 30 tablet 0  . DULERA 200-5 MCG/ACT AERO INHALE 2 PUFFS INTO THE LUNGS TWICE DAILY (Patient taking differently: Inhale 2 puffs into the lungs 2 (two) times daily. ) 13 g 3  . DULoxetine (CYMBALTA) 60 MG capsule Take 60 mg by mouth at bedtime.     Marland Kitchen esomeprazole (NEXIUM) 40 MG capsule TAKE 1 CAPSULE(40 MG) BY MOUTH TWICE DAILY 30 MINUTES BEFORE BREAKFAST AND DINNER (Patient taking differently: Take 40 mg by mouth 2 (two) times daily before a meal. ) 60 capsule 3  . EVZIO 0.4 MG/0.4ML SOAJ Inject 0.4 mLs as directed daily as needed (for overdose).    0  . fluticasone (FLONASE) 50 MCG/ACT nasal spray instill 2 sprays into each nostril once daily (Patient taking differently: Place 2 sprays into both nostrils 2 (two) times daily. ) 16 g 6  . HUMALOG KWIKPEN 100 UNIT/ML KiwkPen Inject 18-32 Units into the skin 2 (two) times daily. PER Patient home SLIDING SCALE 18 units if CBG < 200 32 units  If CBG  > 200  0  . Insulin Glargine (LANTUS SOLOSTAR) 100 UNIT/ML Solostar Pen Inject 40 Units into the skin at bedtime. (Patient taking differently: Inject 60 Units into the skin at bedtime. ) 15 mL 11  . irbesartan (AVAPRO) 75 MG tablet Take 75 mg by mouth daily.   11  . loratadine (CLARITIN) 10 MG tablet TAKE 1 TABLET BY MOUTH ONCE DAILY (Patient taking differently: Take 10 mg by mouth daily. ) 60 tablet 0  . magnesium oxide (MAG-OX) 400 MG tablet TAKE 1 TABLET BY MOUTH EVERY DAY (Patient taking differently: Take 400 mg by mouth daily. ) 90 tablet 3  . nitroGLYCERIN (NITROSTAT) 0.4 MG SL tablet PLACE 1 TABLET UNDER THE TONGUE IF NEEDED EVERY 5 MINUTES AS NEEDED FOR CHEST PAIN; DO NOT EXCEED 3 DOSES (Patient taking differently: Place 0.4 mg under the tongue every 5 (five) minutes as needed for chest pain. ) 25 tablet 2  . ondansetron (ZOFRAN) 4 MG tablet Take 1 tablet (4 mg total) by mouth every 8 (eight) hours as needed for nausea or vomiting. 20 tablet 0  . oxyCODONE-acetaminophen (PERCOCET) 10-325 MG tablet Take 1 tablet by mouth 5 (five) times daily.     . Polyvinyl Alcohol-Povidone PF (REFRESH) 1.4-0.6 % SOLN Place 1 drop into both eyes 3 (three) times daily.     . potassium chloride (K-DUR) 10 MEQ tablet take 1 tablet by mouth once daily (Patient taking differently: Take 10 mEq by mouth daily. ) 90 tablet 3  . pregabalin (LYRICA) 100 MG capsule Take 100 mg by mouth 3 (three) times daily.    . rivaroxaban (XARELTO) 20 MG TABS tablet Take 20 mg by mouth once a day with supper 30 tablet 6  . tamsulosin (FLOMAX) 0.4 MG CAPS capsule Take 0.4 mg by mouth at  bedtime.  0  .  vitamin C (ASCORBIC ACID) 500 MG tablet Take 500 mg by mouth 2 (two) times daily.     No current facility-administered medications for this visit.     Allergies as of 10/12/2018 - Review Complete 10/12/2018  Allergen Reaction Noted  . Prednisone Other (See Comments) 07/07/2012  . Amitriptyline Other (See Comments) 08/25/2011  . Hydromorphone hcl Other (See Comments)   . Lisinopril Cough 12/21/2017    Family History  Problem Relation Age of Onset  . Heart attack Mother        @ age 4  . Mental illness Mother   . Diabetes Mother   . Hypertension Mother        siblings  . Alzheimer's disease Mother   . Depression Mother   . Hyperlipidemia Mother   . Heart attack Brother 50  . Alcohol abuse Brother   . Depression Brother   . Diabetes Brother   . Hyperlipidemia Brother   . Hypertension Brother   . Kidney disease Brother   . Drug abuse Brother   . Alcohol abuse Father   . Heart attack Father   . Hyperlipidemia Father   . Hypertension Father   . Colon cancer Maternal Aunt   . Prostate cancer Maternal Grandfather   . Diabetes Maternal Grandfather   . Hyperlipidemia Maternal Grandfather   . Ovarian cancer Maternal Aunt   . Diabetes Other   . Hypertension Other   . Lupus Sister   . Alcohol abuse Sister   . Depression Sister   . Diabetes Sister   . Hyperlipidemia Sister   . Hypertension Sister   . Kidney disease Sister   . Drug abuse Sister   . Ovarian cancer Cousin   . Diabetes Maternal Grandmother   . Hyperlipidemia Maternal Grandmother   . Breast cancer Neg Hx     Social History   Socioeconomic History  . Marital status: Divorced    Spouse name: Not on file  . Number of children: 3  . Years of education: 12th  . Highest education level: 12th grade  Occupational History  . Occupation: disabled    Comment: CNA  Social Needs  . Financial resource strain: Hard  . Food insecurity:    Worry: Sometimes true    Inability: Sometimes true  .  Transportation needs:    Medical: Yes    Non-medical: Yes  Tobacco Use  . Smoking status: Never Smoker  . Smokeless tobacco: Never Used  Substance and Sexual Activity  . Alcohol use: No  . Drug use: No  . Sexual activity: Not Currently  Lifestyle  . Physical activity:    Days per week: 3 days    Minutes per session: 20 min  . Stress: To some extent  Relationships  . Social connections:    Talks on phone: Twice a week    Gets together: Once a week    Attends religious service: More than 4 times per year    Active member of club or organization: Yes    Attends meetings of clubs or organizations: More than 4 times per year    Relationship status: Divorced  . Intimate partner violence:    Fear of current or ex partner: No    Emotionally abused: No    Physically abused: No    Forced sexual activity: No  Other Topics Concern  . Not on file  Social History Narrative   Patient lives in house with friend/daughter. Has three daughters, 55 grand children, 8 great-grandchildren. Has some  steps, has a handrail on steps. She is single, has three children, is disabled.   No pets. Smoke alarms present. Does not have her own car but is saving to buy one.       Attends church regularly, tries to walk everyday. Likes to participate in church activities and interact with family.     Physical Exam: BP (!) 158/90   Pulse 76   Ht  (1.676 m)   Wt 227 lb (103 kg)   BMI 36.64 kg/m  Constitutional: Kept her eyes closed for most of the examination due to light sensitivity from her migraine.  She is grabbing at her back as well moving very slowly about the room. Psychiatric: alert and oriented x3 Abdomen: soft, mild tenderness right groin, nondistended, no obvious ascites, no peritoneal signs, normal bowel sounds No peripheral edema noted in lower extremities  Assessment and plan: 68 y.o. female with right lower quadrant, right groin pain  I think these are unlikely related to her  gastrointestinal tract.  This was the same complaint she had 2 years ago when I saw her here in the office.  A CT scan abdomen pelvis at that time was unrevealing.  The fact that her pain radiates down her right leg makes me think this is more of a neurologic issue.  That is the same thing I thought 2 years ago.  I offered repeat imaging with CT scan abdomen pelvis and she agreed.   Please see the "Patient Instructions" section for addition details about the plan.  Rob Bunting, MD [facility] 10/12/2018, 2:21 PM

## 2018-10-12 NOTE — Patient Instructions (Addendum)
You will be set up for a CT scan abd/pelvis with IV and oral contrast (for RLQ, right groin pains).  You have been scheduled for a CT scan of the abdomen and pelvis at [facility] ([address]---this is in the same building as Press photographer).   You are scheduled on 10/28/18 at 1030. You should arrive 15 minutes prior to your appointment time for registration. Please follow the written instructions below on the day of your exam:  WARNING: IF YOU ARE ALLERGIC TO IODINE/X-RAY DYE, PLEASE NOTIFY RADIOLOGY IMMEDIATELY AT [phone]! YOU WILL BE GIVEN A 13 HOUR PREMEDICATION PREP.  1) Do not eat or drink anything after 630am (4 hours prior to your test) 2) You have been given 2 bottles of oral contrast to drink. The solution may taste better if refrigerated, but do NOT add ice or any other liquid to this solution. Shake well before drinking.    Drink 1 bottle of contrast @ 830am (2 hours prior to your exam)  Drink 1 bottle of contrast @ 930am (1 hour prior to your exam)  You may take any medications as prescribed with a small amount of water, if necessary. If you take any of the following medications: METFORMIN, GLUCOPHAGE, GLUCOVANCE, AVANDAMET, RIOMET, FORTAMET, [place] MET, JANUMET, GLUMETZA or METAGLIP, you MAY be asked to HOLD this medication 48 hours AFTER the exam.  The purpose of you drinking the oral contrast is to aid in the visualization of your intestinal tract. The contrast solution may cause some diarrhea. Depending on your individual set of symptoms, you may also receive an intravenous injection of x-ray contrast/dye. Plan on being at [facility] for 30 minutes or longer, depending on the type of exam you are having performed.  This test typically takes 30-45 minutes to complete.  If you have any questions regarding your exam or if you need to reschedule, you may call the CT department at [phone] between the hours of 8:00 am and 5:00 pm,  Monday-Friday.  Drink extra water before and after your CT   Thank you for entrusting me with your care and choosing [facility].  Dr Ardis Hughs   ________________________________________________________________________

## 2018-10-13 ENCOUNTER — Encounter: Payer: Self-pay | Admitting: Family Medicine

## 2018-10-17 ENCOUNTER — Encounter: Payer: Self-pay | Admitting: Internal Medicine

## 2018-10-17 ENCOUNTER — Ambulatory Visit (INDEPENDENT_AMBULATORY_CARE_PROVIDER_SITE_OTHER): Payer: Medicare Other | Admitting: Internal Medicine

## 2018-10-17 VITALS — BP 136/78 | HR 78 | Ht 66.0 in | Wt 219.0 lb

## 2018-10-17 DIAGNOSIS — I10 Essential (primary) hypertension: Secondary | ICD-10-CM | POA: Diagnosis not present

## 2018-10-17 DIAGNOSIS — I480 Paroxysmal atrial fibrillation: Secondary | ICD-10-CM

## 2018-10-17 NOTE — Patient Instructions (Signed)
Medication Instructions:  Your physician recommends that you continue on your current medications as directed. Please refer to the Current Medication list given to you today.  Labwork: None ordered.  Testing/Procedures: None ordered.  Follow-Up: Your physician wants you to follow-up in: 6 months with AFIB clinic.   You will receive a reminder letter in the mail two months in advance. If you don't receive a letter, please call our office to schedule the follow-up appointment.  Remote monitoring monthly   Any Other Special Instructions Will Be Listed Below (If Applicable).  If you need a refill on your cardiac medications before your next appointment, please call your pharmacy.

## 2018-10-17 NOTE — Progress Notes (Signed)
PCP: Leeroy Bock, DO Primary Cardiologist: Dr [name] Primary EP: Dr Johney Frame  [name] is a 68 y.o. female who presents today for routine electrophysiology followup.  Since last being seen in our clinic, the patient reports doing very well.  She recently was in the ED 10/05/18 with headache and confusion.  She states that her BP was "low".  She has occasional palpitations which she attributes to afib, though she has had no true AF by device interrogation today.   Today, she denies symptoms of chest pain, shortness of breath,  lower extremity edema,   presyncope, or syncope.  The patient is otherwise without complaint today.   Past Medical History:  Diagnosis Date  . Anxiety   . Arthritis    "knees, hands, ankles, feet" (05/12/2016)  . Asthma    Dr. [name]  . Atrial fibrillation (HCC)   . Bipolar disorder (HCC)   . Chronic back pain    "lower and middle" (05/12/2016)  . Chronic kidney disease (CKD), stage III (moderate) (HCC) 05/14/2015  . CKD (chronic kidney disease), stage III (HCC)   . Colon polyps   . Depression   . Diverticulosis   . Epilepsy (HCC)   . Essential hypertension   . Fibromyalgia   . Gallstones   . GERD (gastroesophageal reflux disease)   . H/O hiatal hernia   . Hyperlipidemia   . IBS (irritable bowel syndrome)   . Insulin dependent diabetes mellitus (HCC)   . Paroxysmal A-fib (HCC)    failed medical therapy with tikosyn, s/p AF ablation x 2  . Paroxysmal A-fib (HCC)    a. On [name]. b. Recurrence 08/2014 in setting of GI illness.  . Paroxysmal atrial fibrillation (HCC) 05/18/2017  . Peripheral neuropathy   . Persistent atrial fibrillation   . Sarcoidosis of lung (HCC)    Dr. [name]  . Seizures (HCC)    "epileptic; pretty regular recently" (05/12/2016)  . Syncope   . Type II diabetes mellitus (HCC)   . Type II diabetes mellitus (HCC)    Past Surgical History:  Procedure Laterality Date  . ABDOMINAL HYSTERECTOMY  1995  . ANTERIOR CERVICAL  DECOMP/DISCECTOMY FUSION  07/11/2012   Procedure: ANTERIOR CERVICAL DECOMPRESSION/DISCECTOMY FUSION 1 LEVEL;  Surgeon: [name], MD;  Location: [facility];  Service: Neurosurgery;  Laterality: N/A;  Cervical Five-Six Anterior Cervical Decompression with Fusion Interbody Prothesis Plating and Bonegraft  . ATRIAL FIBRILLATION ABLATION N/A 05/18/2017   Procedure: Atrial Fibrillation Ablation;  Surgeon: [name], MD;  Location: [facility];  Service: Cardiovascular;  Laterality: N/A;  . ATRIAL FIBRILLATION ABLATION N/A 11/16/2017   Procedure: ATRIAL FIBRILLATION ABLATION;  Surgeon: [name], MD;  Location: [facility];  Service: Cardiovascular;  Laterality: N/A;  . CARDIAC CATHETERIZATION  2012   a. Normal coronaries 2012. b. Normal nuc 09/2014.  Marland Kitchen CARDIAC CATHETERIZATION  05/18/2017  . CHOLECYSTECTOMY OPEN  1976  . CLOSED REDUCTION ANKLE FRACTURE Left 10/2006   "got steel rod in my leg; and screws"  . COLON SURGERY    . DILATION AND CURETTAGE OF UTERUS    . ESOPHAGEAL MANOMETRY  03/21/2012   Procedure: ESOPHAGEAL MANOMETRY (EM);  Surgeon: [name], MD;  Location: [facility];  Service: Endoscopy;  Laterality: N/A;  . FRACTURE SURGERY    . LOOP RECORDER INSERTION N/A 07/12/2018   Procedure: LOOP RECORDER INSERTION;  Surgeon: [name], MD;  Location: [facility];  Service: Cardiovascular;  Laterality: N/A;  .  NEUROPLASTY / TRANSPOSITION MEDIAN NERVE AT CARPAL TUNNEL Left 2004  . NEUROPLASTY / TRANSPOSITION MEDIAN NERVE AT CARPAL TUNNEL Right 2002  . 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 SALPINGOOPHORECTOMY Bilateral 2000  . TEE WITHOUT CARDIOVERSION N/A 05/18/2017   Procedure: TRANSESOPHAGEAL ECHOCARDIOGRAM (TEE);  Surgeon: [name], MD;  Location: [facility];  Service: Cardiovascular;  Laterality: N/A;  . TUBAL LIGATION  1982    ROS- all systems are reviewed and negatives except as per HPI above  Current Outpatient Medications    Medication Sig Dispense Refill  . ACCU-CHEK GUIDE test strip AS DIRECTED QID IN VITRO    . albuterol (PROAIR HFA) 108 (90 Base) MCG/ACT inhaler inhale 2 puffs every 6 hours if needed for wheezing or shortness of breath (Patient taking differently: Inhale 2 puffs into the lungs every 6 (six) hours as needed for wheezing or shortness of breath. ) 8.5 g 1  . albuterol (PROVENTIL) (2.5 MG/3ML) 0.083% nebulizer solution Take 3 mLs (2.5 mg total) by nebulization every 4 (four) hours as needed for wheezing or shortness of breath (dx: J45.31). 75 mL 5  . amLODipine (NORVASC) 10 MG tablet Take 1 tablet (10 mg total) by mouth daily. 60 tablet 3  . atorvastatin (LIPITOR) 80 MG tablet Take 80 mg by mouth at bedtime.  0  . BD PEN NEEDLE NANO U/F 32G X 4 MM MISC USE TO ADMINISTER INSULIN 4 TIMES DAILY    . calcium carbonate (OS-CAL) 600 MG TABS tablet Take 600 mg by mouth daily.    . cholecalciferol (VITAMIN D) 1000 UNITS tablet Take 1,000 Units by mouth 2 (two) times daily.     . Cranberry 500 MG CAPS Take 500 mg by mouth 2 (two) times daily.    . diclofenac sodium (VOLTAREN) 1 % GEL APPLY 2 INCHES TO AFFECTED AREA THREE TIMES A DAY (Patient taking differently: Apply 1 application topically 4 (four) times daily. ) 500 g 0  . diphenoxylate-atropine (LOMOTIL) 2.5-0.025 MG tablet TAKE 1 TABLET BY MOUTH TWICE DAILY AS NEEDED FOR DIARRHEA OR LOOSE STOOLS (Patient taking differently: Take 1 tablet by mouth 2 (two) times daily as needed for diarrhea or loose stools. ) 30 tablet 0  . DULERA 200-5 MCG/ACT AERO INHALE 2 PUFFS INTO THE LUNGS TWICE DAILY (Patient taking differently: Inhale 2 puffs into the lungs 2 (two) times daily. ) 13 g 3  . DULoxetine (CYMBALTA) 60 MG capsule Take 60 mg by mouth at bedtime.     Marland Kitchen esomeprazole (NEXIUM) 40 MG capsule TAKE 1 CAPSULE(40 MG) BY MOUTH TWICE DAILY 30 MINUTES BEFORE BREAKFAST AND DINNER (Patient taking differently: Take 40 mg by mouth 2 (two) times daily before a meal. ) 60  capsule 3  . EVZIO 0.4 MG/0.4ML SOAJ Inject 0.4 mLs as directed daily as needed (for overdose).   0  . fluticasone (FLONASE) 50 MCG/ACT nasal spray instill 2 sprays into each nostril once daily (Patient taking differently: Place 2 sprays into both nostrils 2 (two) times daily. ) 16 g 6  . HUMALOG KWIKPEN 100 UNIT/ML KiwkPen Inject 18-32 Units into the skin 2 (two) times daily. PER Patient home SLIDING SCALE 18 units if CBG < 200 32 units  If CBG  > 200  0  . Insulin Glargine (LANTUS SOLOSTAR) 100 UNIT/ML Solostar Pen Inject 40 Units into the skin at bedtime. (Patient taking differently: Inject 60 Units into the skin at bedtime. ) 15 mL 11  . irbesartan (AVAPRO) 75 MG tablet Take 75  mg by mouth daily.   11  . loratadine (CLARITIN) 10 MG tablet TAKE 1 TABLET BY MOUTH ONCE DAILY (Patient taking differently: Take 10 mg by mouth daily. ) 60 tablet 0  . magnesium oxide (MAG-OX) 400 MG tablet TAKE 1 TABLET BY MOUTH EVERY DAY (Patient taking differently: Take 400 mg by mouth daily. ) 90 tablet 3  . nitroGLYCERIN (NITROSTAT) 0.4 MG SL tablet PLACE 1 TABLET UNDER THE TONGUE IF NEEDED EVERY 5 MINUTES AS NEEDED FOR CHEST PAIN; DO NOT EXCEED 3 DOSES (Patient taking differently: Place 0.4 mg under the tongue every 5 (five) minutes as needed for chest pain. ) 25 tablet 2  . ondansetron (ZOFRAN) 4 MG tablet Take 1 tablet (4 mg total) by mouth every 8 (eight) hours as needed for nausea or vomiting. 20 tablet 0  . oxyCODONE-acetaminophen (PERCOCET) 10-325 MG tablet Take 1 tablet by mouth 5 (five) times daily.     . Polyvinyl Alcohol-Povidone PF (REFRESH) 1.4-0.6 % SOLN Place 1 drop into both eyes 3 (three) times daily.     . potassium chloride (K-DUR) 10 MEQ tablet take 1 tablet by mouth once daily (Patient taking differently: Take 10 mEq by mouth daily. ) 90 tablet 3  . pregabalin (LYRICA) 100 MG capsule Take 100 mg by mouth 3 (three) times daily.    . rivaroxaban (XARELTO) 20 MG TABS tablet Take 20 mg by mouth once  a day with supper 30 tablet 6  . tamsulosin (FLOMAX) 0.4 MG CAPS capsule Take 0.4 mg by mouth at bedtime.  0  . vitamin C (ASCORBIC ACID) 500 MG tablet Take 500 mg by mouth 2 (two) times daily.     No current facility-administered medications for this visit.     Physical Exam: Vitals:   10/17/18 1456  BP: 136/78  Pulse: 78  SpO2: 98%  Weight: 219 lb (99.3 kg)  Height:  (1.676 m)    GEN- The patient is well appearing, alert and oriented x 3 today.   Head- normocephalic, atraumatic Eyes-  Sclera clear, conjunctiva pink Ears- hearing intact Oropharynx- clear Lungs- Clear to ausculation bilaterally, normal work of breathing Heart- Regular rate and rhythm, no murmurs, rubs or gallops, PMI not laterally displaced GI- soft, NT, ND, + BS Extremities- no clubbing, cyanosis, or edema  Wt Readings from Last 3 Encounters:  10/17/18 219 lb (99.3 kg)  10/12/18 227 lb (103 kg)  10/10/18 221 lb (100.2 kg)    EKG tracing ordered today is personally reviewed and shows sinus rhythm  Assessment and Plan:  1. Paroxysmal atrial fibrillation/ atrial flutter Doing well s/p ablation afib burden by ILR interrogation today is 0 % Device is programmed to least sensitive detection today to prevent false AF episodes  2. Post termination pauses Resolved post ablation  3. HTN Stable No change required today  4. ? Pseudoseizures Followed by neurology (Dr Marjory Lies)  [facility] Return to AF clinic in 6 months I will see in a year  [name] MD, [facility] 10/17/2018 3:07 PM

## 2018-10-18 LAB — CUP PACEART INCLINIC DEVICE CHECK
Date Time Interrogation Session: 20200303075607
Implantable Pulse Generator Implant Date: 20191126

## 2018-10-19 ENCOUNTER — Ambulatory Visit (INDEPENDENT_AMBULATORY_CARE_PROVIDER_SITE_OTHER): Payer: Medicare Other | Admitting: *Deleted

## 2018-10-19 DIAGNOSIS — R002 Palpitations: Secondary | ICD-10-CM | POA: Diagnosis not present

## 2018-10-19 DIAGNOSIS — I480 Paroxysmal atrial fibrillation: Secondary | ICD-10-CM

## 2018-10-19 LAB — CUP PACEART REMOTE DEVICE CHECK
Date Time Interrogation Session: 20200304131051
Implantable Pulse Generator Implant Date: 20191126

## 2018-10-26 NOTE — Progress Notes (Signed)
Carelink Summary Report / Loop Recorder 

## 2018-10-27 ENCOUNTER — Telehealth: Payer: Self-pay | Admitting: Gastroenterology

## 2018-10-27 NOTE — Telephone Encounter (Signed)
Pt is scheduled for CT and requested instructions on prep.

## 2018-10-27 NOTE — Telephone Encounter (Signed)
Spoke to patient. Went over instructions for her CT over the phone since her appointment is tomorrow morning. Patient voiced understanding on the verbal CT instructions

## 2018-10-28 ENCOUNTER — Ambulatory Visit (INDEPENDENT_AMBULATORY_CARE_PROVIDER_SITE_OTHER)
Admission: RE | Admit: 2018-10-28 | Discharge: 2018-10-28 | Disposition: A | Discharge status: Home / Self Care | Payer: Medicare Other | Source: Ambulatory Visit | Attending: Gastroenterology | Admitting: Gastroenterology

## 2018-10-28 ENCOUNTER — Other Ambulatory Visit: Payer: Self-pay

## 2018-10-28 DIAGNOSIS — R1031 Right lower quadrant pain: Secondary | ICD-10-CM

## 2018-10-28 DIAGNOSIS — G8929 Other chronic pain: Secondary | ICD-10-CM

## 2018-10-28 MED ORDER — IOHEXOL 300 MG/ML  SOLN
80.0000 mL | Freq: Once | INTRAMUSCULAR | Status: AC | PRN
  Administered 2018-10-28: 80 mL via INTRAVENOUS

## 2018-11-14 ENCOUNTER — Other Ambulatory Visit: Payer: Self-pay | Admitting: Family Medicine

## 2018-11-14 DIAGNOSIS — J302 Other seasonal allergic rhinitis: Secondary | ICD-10-CM

## 2018-11-16 ENCOUNTER — Other Ambulatory Visit: Payer: Self-pay | Admitting: Student in an Organized Health Care Education/Training Program

## 2018-11-16 DIAGNOSIS — J302 Other seasonal allergic rhinitis: Secondary | ICD-10-CM

## 2018-11-16 NOTE — Telephone Encounter (Signed)
Pharmacy calls to check status. Jone Baseman, CMA

## 2018-11-17 ENCOUNTER — Other Ambulatory Visit: Payer: Self-pay

## 2018-11-17 MED ORDER — LORATADINE 10 MG PO TABS: 10.0000 mg | ORAL_TABLET | Freq: Every day | ORAL | 2 refills | Status: AC

## 2018-11-21 ENCOUNTER — Other Ambulatory Visit: Payer: Self-pay

## 2018-11-21 ENCOUNTER — Ambulatory Visit (INDEPENDENT_AMBULATORY_CARE_PROVIDER_SITE_OTHER): Payer: Medicare Other | Admitting: *Deleted

## 2018-11-21 DIAGNOSIS — R002 Palpitations: Secondary | ICD-10-CM

## 2018-11-21 LAB — CUP PACEART REMOTE DEVICE CHECK
Date Time Interrogation Session: 20200406124009
Implantable Pulse Generator Implant Date: 20191126

## 2018-11-23 ENCOUNTER — Ambulatory Visit: Payer: Medicare Other | Admitting: Neurology

## 2018-11-29 NOTE — Progress Notes (Signed)
Carelink Summary Report / Loop Recorder 

## 2018-12-23 ENCOUNTER — Telehealth: Payer: Self-pay | Admitting: Cardiology

## 2018-12-23 ENCOUNTER — Telehealth: Payer: Self-pay

## 2018-12-23 NOTE — Telephone Encounter (Signed)
Left message for patient to remind of disconnected monitor 

## 2018-12-23 NOTE — Telephone Encounter (Signed)
Pt called back b/c of the disconnected monitor call. She stated that she isn't near her monitor at this time. I instructed her to send a manual transmission w/ her home monitor once she has the monitor w/ her. Pt verbalized understanding.

## 2018-12-26 ENCOUNTER — Ambulatory Visit (INDEPENDENT_AMBULATORY_CARE_PROVIDER_SITE_OTHER): Payer: Medicare Other | Admitting: *Deleted

## 2018-12-26 ENCOUNTER — Other Ambulatory Visit: Payer: Self-pay

## 2018-12-26 DIAGNOSIS — I480 Paroxysmal atrial fibrillation: Secondary | ICD-10-CM

## 2018-12-26 DIAGNOSIS — R002 Palpitations: Secondary | ICD-10-CM

## 2018-12-26 LAB — CUP PACEART REMOTE DEVICE CHECK
Date Time Interrogation Session: 20200509144054
Implantable Pulse Generator Implant Date: 20191126

## 2019-01-03 NOTE — Progress Notes (Signed)
Carelink Summary Report / Loop Recorder 

## 2019-01-26 ENCOUNTER — Ambulatory Visit (INDEPENDENT_AMBULATORY_CARE_PROVIDER_SITE_OTHER): Payer: Medicare Other | Admitting: *Deleted

## 2019-01-26 DIAGNOSIS — R002 Palpitations: Secondary | ICD-10-CM

## 2019-01-27 ENCOUNTER — Other Ambulatory Visit: Payer: Self-pay

## 2019-01-29 LAB — CUP PACEART REMOTE DEVICE CHECK
Date Time Interrogation Session: 20200611143540
Implantable Pulse Generator Implant Date: 20191126

## 2019-02-01 NOTE — Progress Notes (Signed)
Carelink Summary Report / Loop Recorder 

## 2019-02-21 ENCOUNTER — Other Ambulatory Visit: Payer: Self-pay | Admitting: Nurse Practitioner

## 2019-02-21 ENCOUNTER — Other Ambulatory Visit: Payer: Self-pay

## 2019-02-21 DIAGNOSIS — R110 Nausea: Secondary | ICD-10-CM

## 2019-02-22 MED ORDER — ONDANSETRON HCL 4 MG PO TABS: 4.0000 mg | ORAL_TABLET | Freq: Three times a day (TID) | ORAL | 0 refills | Status: AC | PRN

## 2019-02-22 NOTE — Telephone Encounter (Signed)
Hey team! Can you please see if patient followoed up with neurology for migraines as was discussed at apt with Dr. Ardelia Mems in February? If not, and she is still having migraines, I want her to follow up with them before I will refill any more of this medication. Thank you Chelsey

## 2019-02-28 ENCOUNTER — Ambulatory Visit (INDEPENDENT_AMBULATORY_CARE_PROVIDER_SITE_OTHER): Payer: 59 | Admitting: *Deleted

## 2019-02-28 DIAGNOSIS — R55 Syncope and collapse: Secondary | ICD-10-CM

## 2019-02-28 DIAGNOSIS — I480 Paroxysmal atrial fibrillation: Secondary | ICD-10-CM

## 2019-02-28 LAB — CUP PACEART REMOTE DEVICE CHECK
Date Time Interrogation Session: 20200714153655
Implantable Pulse Generator Implant Date: 20191126

## 2019-03-13 ENCOUNTER — Other Ambulatory Visit: Payer: Self-pay | Admitting: Student in an Organized Health Care Education/Training Program

## 2019-03-13 ENCOUNTER — Other Ambulatory Visit: Payer: Self-pay | Admitting: Gastroenterology

## 2019-03-13 ENCOUNTER — Telehealth: Payer: Self-pay | Admitting: Gastroenterology

## 2019-03-13 DIAGNOSIS — R110 Nausea: Secondary | ICD-10-CM

## 2019-03-13 MED ORDER — ESOMEPRAZOLE MAGNESIUM 40 MG PO CPDR: DELAYED_RELEASE_CAPSULE | ORAL | 3 refills | Status: DC

## 2019-03-13 NOTE — Telephone Encounter (Signed)
Sent to pharmacy. They will contact patient when ready

## 2019-03-13 NOTE — Telephone Encounter (Signed)
Patient called said that she would like a refill for esomeprazole (NEXIUM) 40 MG

## 2019-03-14 NOTE — Progress Notes (Signed)
Carelink Summary Report / Loop Recorder 

## 2019-04-02 LAB — CUP PACEART REMOTE DEVICE CHECK
Date Time Interrogation Session: 20200816154132
Implantable Pulse Generator Implant Date: 20191126

## 2019-04-03 ENCOUNTER — Ambulatory Visit (INDEPENDENT_AMBULATORY_CARE_PROVIDER_SITE_OTHER): Payer: 59 | Admitting: *Deleted

## 2019-04-03 DIAGNOSIS — R002 Palpitations: Secondary | ICD-10-CM

## 2019-04-09 ENCOUNTER — Other Ambulatory Visit: Payer: Self-pay | Admitting: Internal Medicine

## 2019-04-10 NOTE — Telephone Encounter (Signed)
Prescription refill request for Xarelto received.   Last office visit: [place]red (10-17-2018) Weight: 99.3 kg  Age: 68 y.o. Scr: 1.45 (10-05-2018) CrCl: 59 ml/min   Prescription refill sent.

## 2019-04-11 NOTE — Progress Notes (Signed)
Carelink Summary Report / Loop Recorder 

## 2019-05-05 ENCOUNTER — Ambulatory Visit (INDEPENDENT_AMBULATORY_CARE_PROVIDER_SITE_OTHER): Payer: 59 | Admitting: *Deleted

## 2019-05-05 DIAGNOSIS — R002 Palpitations: Secondary | ICD-10-CM | POA: Diagnosis not present

## 2019-05-05 LAB — CUP PACEART REMOTE DEVICE CHECK
Date Time Interrogation Session: 20200918145902
Implantable Pulse Generator Implant Date: 20191126

## 2019-05-08 NOTE — Progress Notes (Signed)
Carelink Summary Report / Loop Recorder 

## 2019-05-15 ENCOUNTER — Other Ambulatory Visit: Payer: Self-pay

## 2019-05-15 DIAGNOSIS — J4530 Mild persistent asthma, uncomplicated: Secondary | ICD-10-CM

## 2019-05-15 MED ORDER — DULERA 200-5 MCG/ACT IN AERO: 2.0000 | INHALATION_SPRAY | Freq: Two times a day (BID) | RESPIRATORY_TRACT | 3 refills | Status: AC

## 2019-06-07 ENCOUNTER — Ambulatory Visit (INDEPENDENT_AMBULATORY_CARE_PROVIDER_SITE_OTHER): Payer: 59 | Admitting: *Deleted

## 2019-06-07 DIAGNOSIS — I480 Paroxysmal atrial fibrillation: Secondary | ICD-10-CM

## 2019-06-08 LAB — CUP PACEART REMOTE DEVICE CHECK
Date Time Interrogation Session: 20201021161056
Implantable Pulse Generator Implant Date: 20191126

## 2019-06-16 ENCOUNTER — Telehealth: Payer: Self-pay

## 2019-06-16 MED ORDER — POTASSIUM CHLORIDE ER 10 MEQ PO TBCR: 10.0000 meq | EXTENDED_RELEASE_TABLET | Freq: Every day | ORAL | 0 refills | Status: DC

## 2019-06-16 NOTE — Telephone Encounter (Signed)
RX sent

## 2019-06-20 ENCOUNTER — Telehealth: Payer: Self-pay

## 2019-06-20 NOTE — Telephone Encounter (Signed)
Pt states she do wants to be released in [facility] because she has moved.

## 2019-06-20 NOTE — Progress Notes (Signed)
Carelink Summary Report / Loop Recorder 

## 2019-07-17 ENCOUNTER — Other Ambulatory Visit: Payer: Self-pay

## 2019-07-17 MED ORDER — POTASSIUM CHLORIDE ER 10 MEQ PO TBCR: 10.0000 meq | EXTENDED_RELEASE_TABLET | Freq: Every day | ORAL | 0 refills | Status: DC

## 2019-07-17 MED ORDER — POTASSIUM CHLORIDE ER 10 MEQ PO TBCR: 10.0000 meq | EXTENDED_RELEASE_TABLET | Freq: Every day | ORAL | 0 refills | Status: AC

## 2019-08-15 ENCOUNTER — Other Ambulatory Visit: Payer: Self-pay | Admitting: Gastroenterology

## 2019-08-15 DIAGNOSIS — R110 Nausea: Secondary | ICD-10-CM

## 2019-09-18 DEATH — deceased

## 2019-12-20 IMAGING — CR DG CERVICAL SPINE COMPLETE 4+V
6 series · 6 of 6 positions shown · non-contrast
Comparison: Chest x-ray dated May 05, 2017. Cervical spine
x-rays dated November 01, 2012.

CLINICAL DATA: Neck and back pain after fall.

EXAM:
THORACIC SPINE 2 VIEWS; CERVICAL SPINE - COMPLETE 4+ VIEW

[c-spine lat]
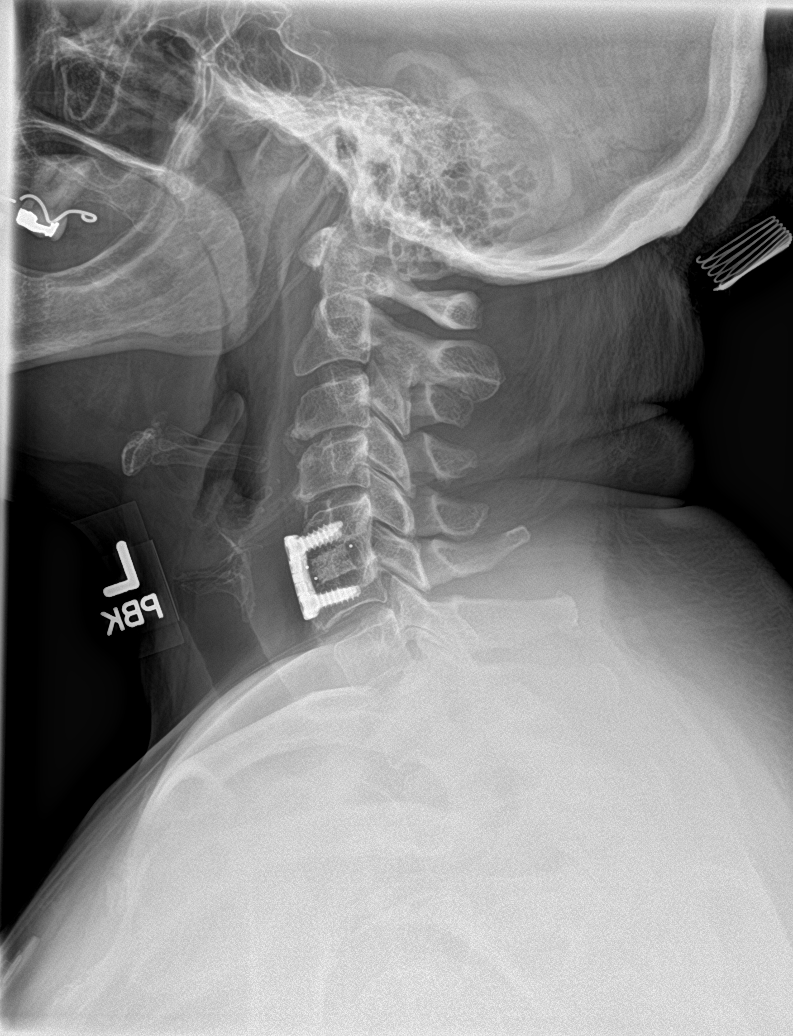

[c-spine obl (1 of 2)]
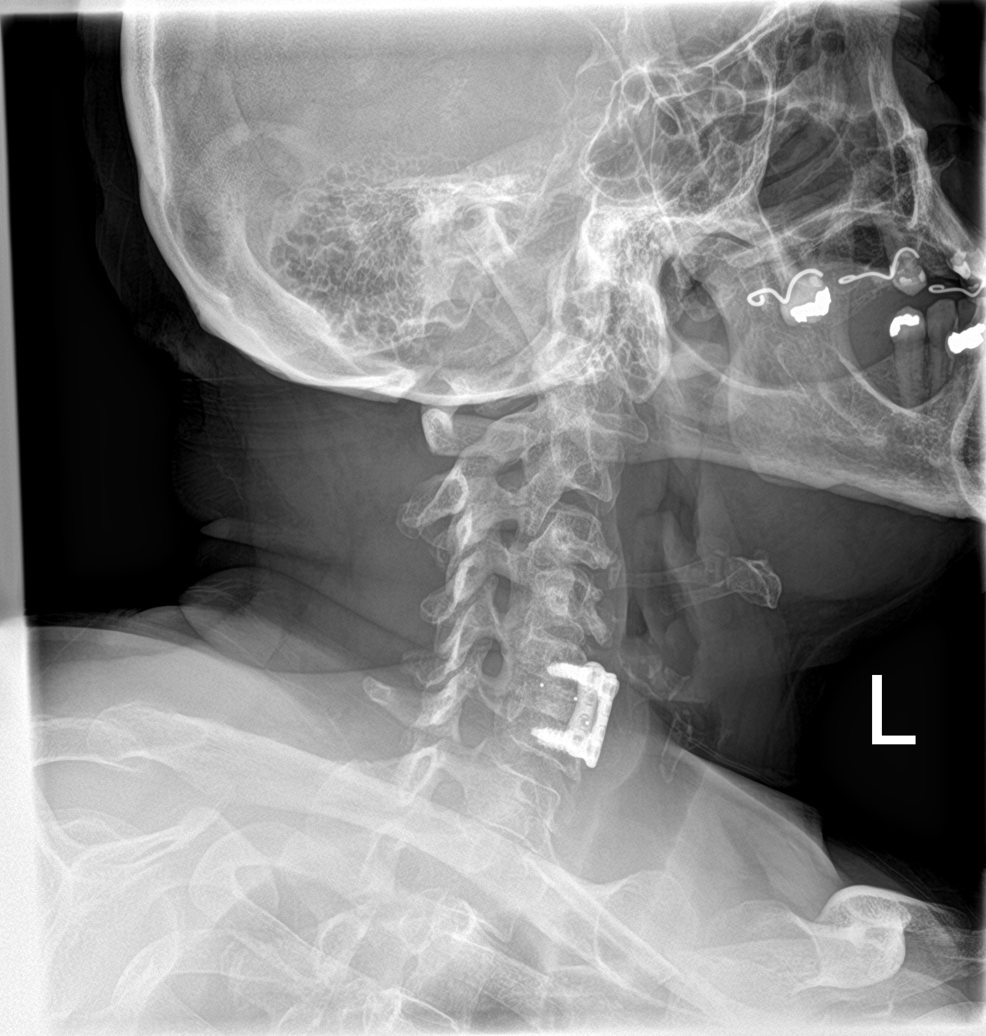

[c-spine obl (2 of 2)]
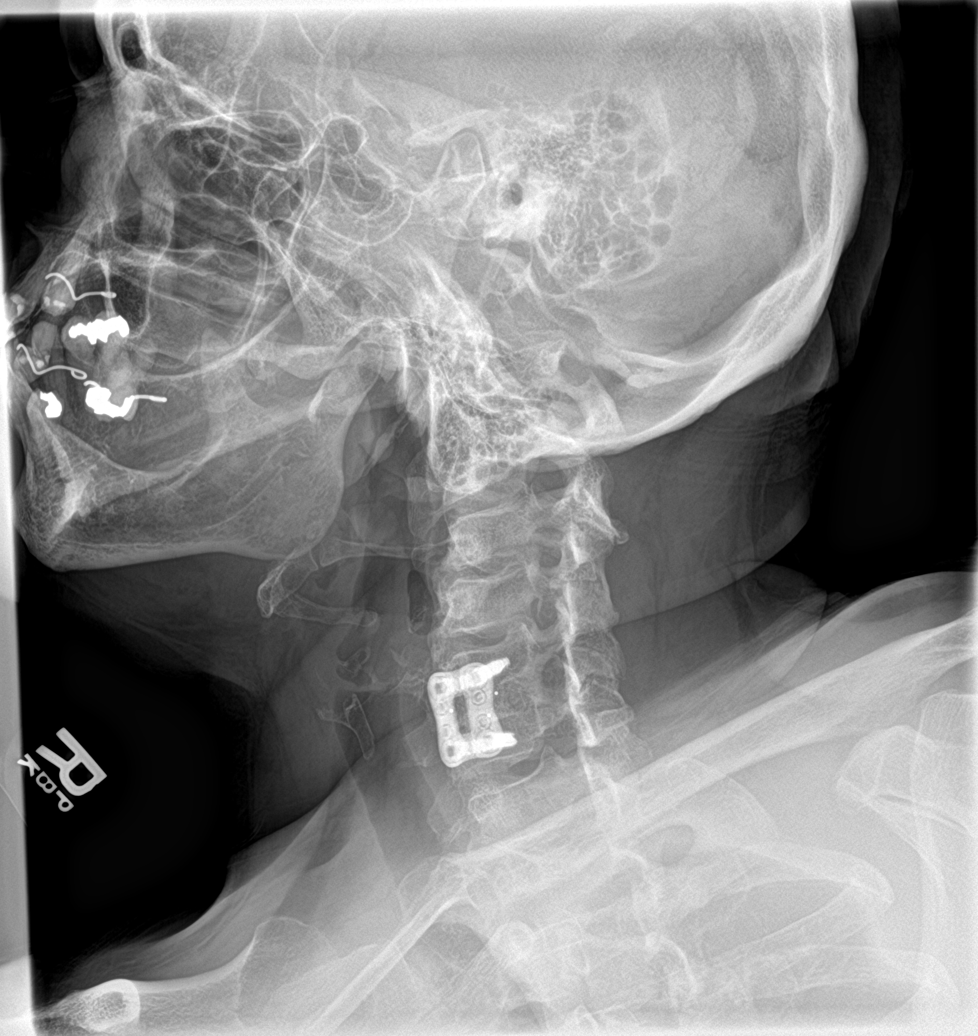

[c-spine ap]
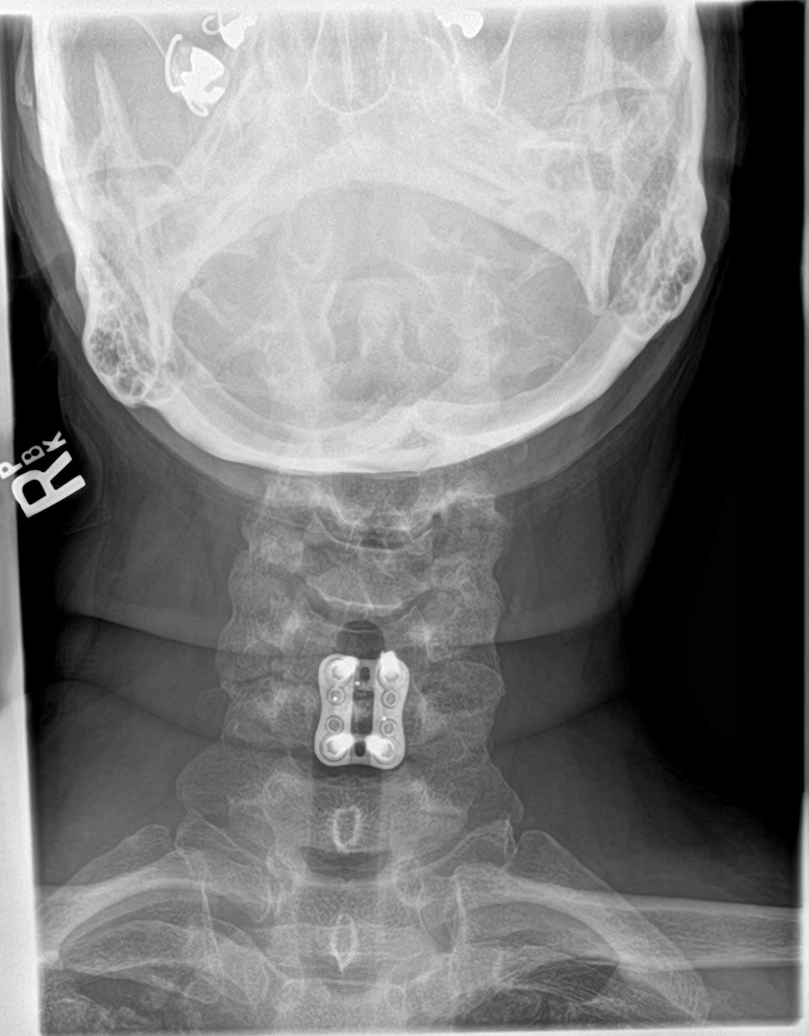

[c-spine open mouth]
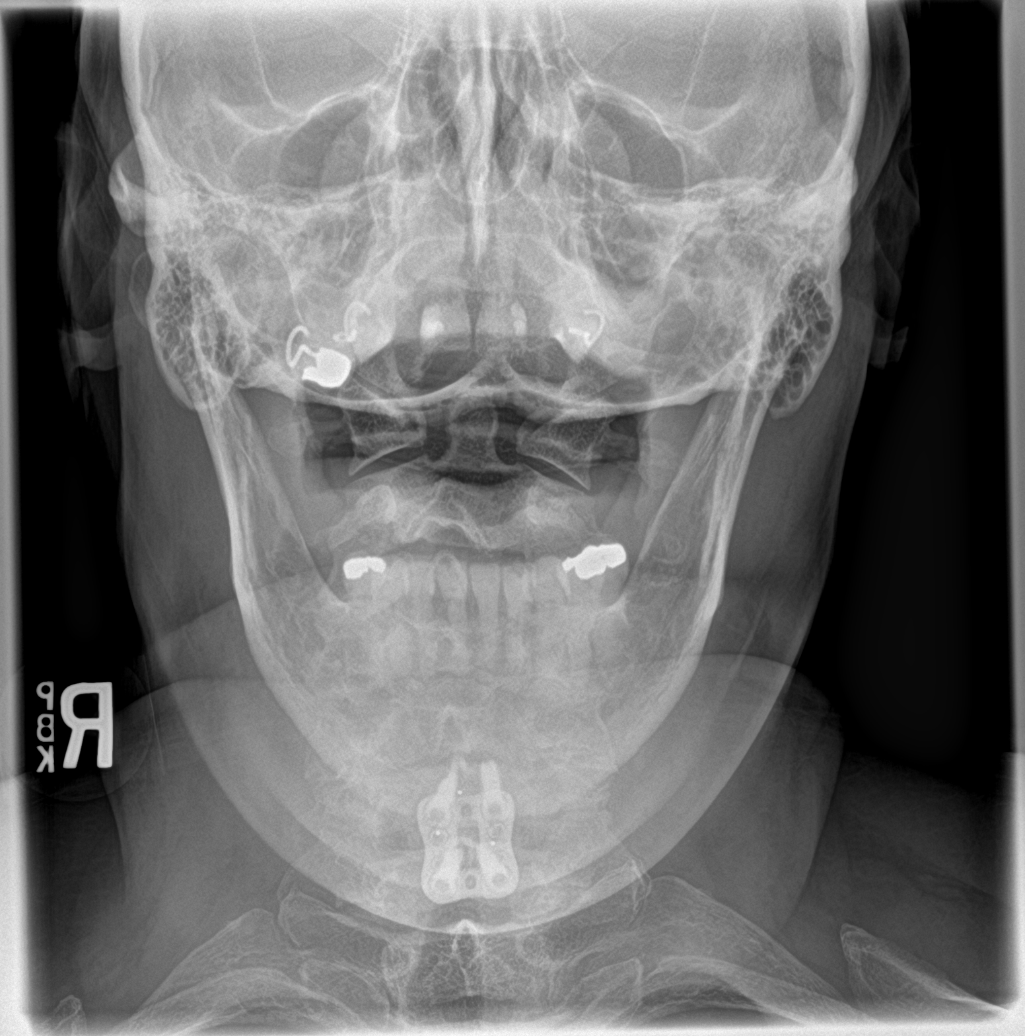

[c-spine swimmers]
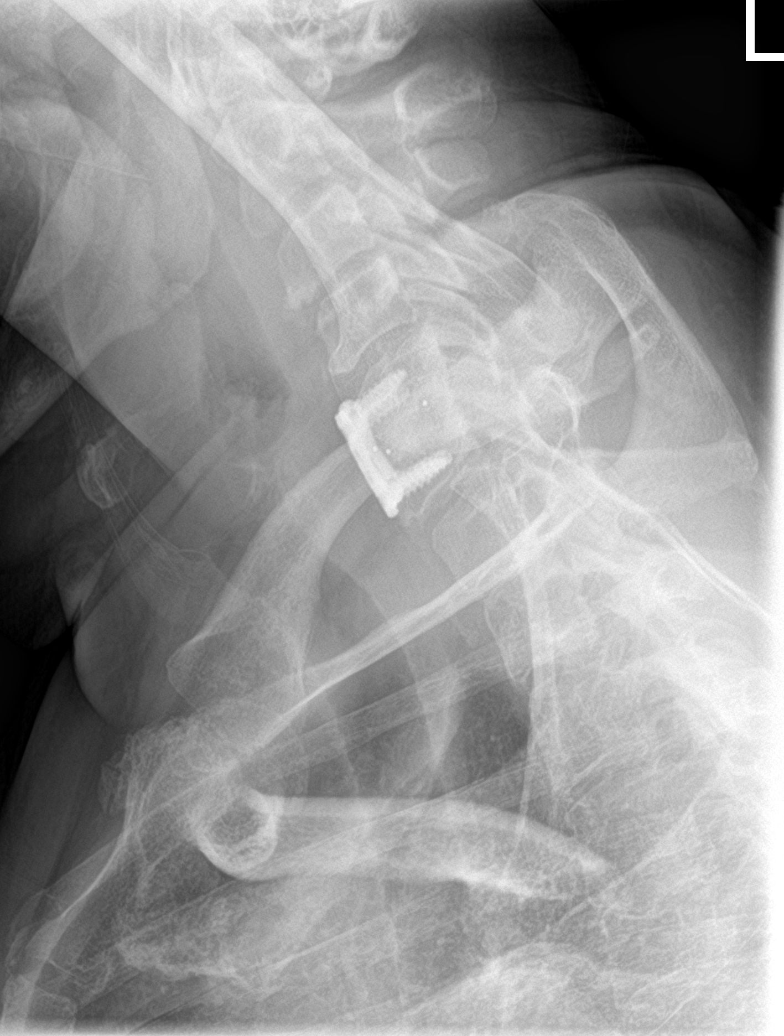

[6 of 6 positions shown; findings below may reference images not displayed]

FINDINGS: Cervical spine: The lateral view is diagnostic to the C7 level.
Prior C5-C6 ACDF with solid osseous fusion and no evidence of
hardware complication. There is no acute fracture or subluxation.
Vertebral body heights are preserved. Alignment is normal. Mild disc
height loss at C3-C4 and C4-C5, progressed when compared to prior
study. Facet arthropathy at C7-T1. The neural foramina are
patent.Normal prevertebral soft tissues.

Thoracic spine: Twelve rib-bearing thoracic vertebral bodies. No
acute fracture or subluxation. Vertebral body heights are preserved.
Alignment is normal. Intervertebral disc spaces are maintained.
IMPRESSION: 1. No acute osseous abnormality in the cervical or thoracic spine.
2. Prior C5-C6 ACDF without evidence of hardware complication.
3. Mild degenerative disc disease at C3-C4 and C4-C5.

## 2019-12-20 IMAGING — CR DG THORACIC SPINE 2V
2 series · 2 of 2 positions shown · non-contrast
Comparison: Chest x-ray dated May 05, 2017. Cervical spine
x-rays dated November 01, 2012.

CLINICAL DATA: Neck and back pain after fall.

EXAM:
THORACIC SPINE 2 VIEWS; CERVICAL SPINE - COMPLETE 4+ VIEW

[t-spine ap]
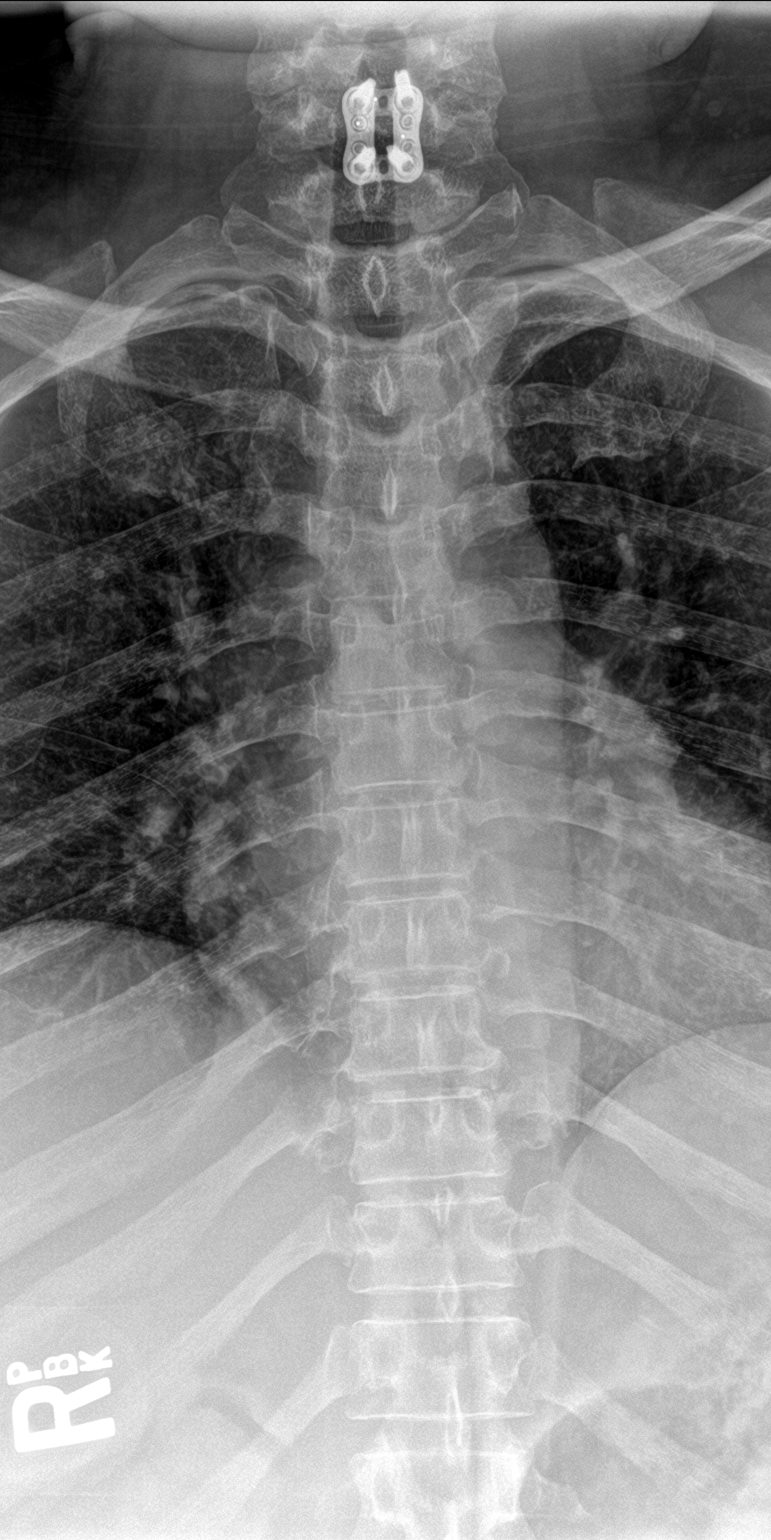

[t-spine lat]
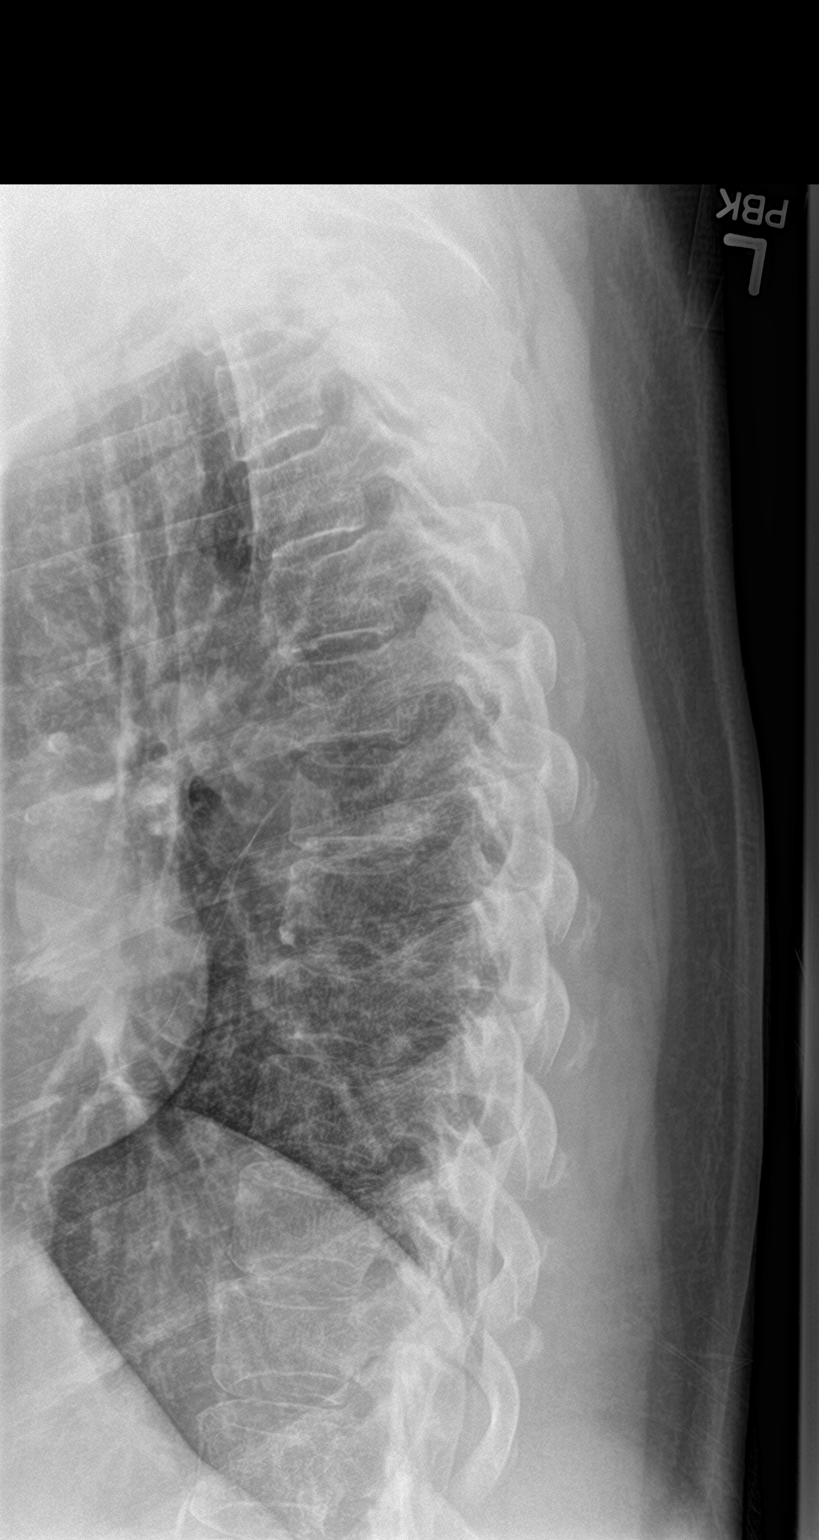

[2 of 2 positions shown; findings below may reference images not displayed]

FINDINGS: Cervical spine: The lateral view is diagnostic to the C7 level.
Prior C5-C6 ACDF with solid osseous fusion and no evidence of
hardware complication. There is no acute fracture or subluxation.
Vertebral body heights are preserved. Alignment is normal. Mild disc
height loss at C3-C4 and C4-C5, progressed when compared to prior
study. Facet arthropathy at C7-T1. The neural foramina are
patent.Normal prevertebral soft tissues.

Thoracic spine: Twelve rib-bearing thoracic vertebral bodies. No
acute fracture or subluxation. Vertebral body heights are preserved.
Alignment is normal. Intervertebral disc spaces are maintained.
IMPRESSION: 1. No acute osseous abnormality in the cervical or thoracic spine.
2. Prior C5-C6 ACDF without evidence of hardware complication.
3. Mild degenerative disc disease at C3-C4 and C4-C5.
